# Patient Record
Sex: Male | Born: 1935 | Race: Black or African American | Hispanic: No | Smoking: Former smoker
Health system: Southern US, Community
[De-identification: ages and names within clinical notes are randomized; demographics above are authoritative.]

## PROBLEM LIST (undated history)

## (undated) DIAGNOSIS — I10 Essential (primary) hypertension: Secondary | ICD-10-CM

## (undated) DIAGNOSIS — R17 Unspecified jaundice: Secondary | ICD-10-CM

## (undated) DIAGNOSIS — E785 Hyperlipidemia, unspecified: Secondary | ICD-10-CM

## (undated) DIAGNOSIS — K219 Gastro-esophageal reflux disease without esophagitis: Secondary | ICD-10-CM

## (undated) DIAGNOSIS — Z950 Presence of cardiac pacemaker: Secondary | ICD-10-CM

## (undated) DIAGNOSIS — D649 Anemia, unspecified: Secondary | ICD-10-CM

## (undated) DIAGNOSIS — R7989 Other specified abnormal findings of blood chemistry: Secondary | ICD-10-CM

## (undated) DIAGNOSIS — N429 Disorder of prostate, unspecified: Secondary | ICD-10-CM

## (undated) DIAGNOSIS — M109 Gout, unspecified: Secondary | ICD-10-CM

## (undated) DIAGNOSIS — C189 Malignant neoplasm of colon, unspecified: Secondary | ICD-10-CM

## (undated) DIAGNOSIS — R1011 Right upper quadrant pain: Secondary | ICD-10-CM

## (undated) DIAGNOSIS — Z87891 Personal history of nicotine dependence: Secondary | ICD-10-CM

## (undated) DIAGNOSIS — C61 Malignant neoplasm of prostate: Secondary | ICD-10-CM

## (undated) HISTORY — PX: PROSTATE SURGERY: SHX751

## (undated) HISTORY — PX: PACEMAKER INSERTION: SHX728

## (undated) HISTORY — PX: COLON SURGERY: SHX602

---

## 2001-07-15 ENCOUNTER — Other Ambulatory Visit: Admission: RE | Admit: 2001-07-15 | Discharge: 2001-07-15 | Payer: Self-pay | Admitting: Urology

## 2001-07-25 ENCOUNTER — Encounter: Payer: Self-pay | Admitting: General Surgery

## 2001-07-25 ENCOUNTER — Ambulatory Visit (HOSPITAL_COMMUNITY): Admission: RE | Admit: 2001-07-25 | Discharge: 2001-07-25 | Payer: Self-pay | Admitting: General Surgery

## 2001-07-29 ENCOUNTER — Encounter: Payer: Self-pay | Admitting: Urology

## 2001-07-29 ENCOUNTER — Ambulatory Visit (HOSPITAL_COMMUNITY): Admission: RE | Admit: 2001-07-29 | Discharge: 2001-07-29 | Payer: Self-pay | Admitting: Urology

## 2001-08-23 ENCOUNTER — Ambulatory Visit: Admission: RE | Admit: 2001-08-23 | Discharge: 2001-12-08 | Payer: Self-pay | Admitting: Radiation Oncology

## 2001-10-26 ENCOUNTER — Encounter: Admission: RE | Admit: 2001-10-26 | Discharge: 2001-10-26 | Payer: Self-pay | Admitting: Urology

## 2001-10-26 ENCOUNTER — Encounter: Payer: Self-pay | Admitting: Urology

## 2001-11-02 ENCOUNTER — Ambulatory Visit (HOSPITAL_BASED_OUTPATIENT_CLINIC_OR_DEPARTMENT_OTHER): Admission: RE | Admit: 2001-11-02 | Discharge: 2001-11-02 | Payer: Self-pay | Admitting: Urology

## 2001-11-02 ENCOUNTER — Encounter: Payer: Self-pay | Admitting: Urology

## 2001-11-24 ENCOUNTER — Ambulatory Visit: Admission: RE | Admit: 2001-11-24 | Discharge: 2001-12-08 | Payer: Self-pay | Admitting: Radiation Oncology

## 2001-11-28 ENCOUNTER — Ambulatory Visit: Admission: RE | Admit: 2001-11-28 | Discharge: 2002-02-26 | Payer: Self-pay | Admitting: Radiation Oncology

## 2001-11-28 ENCOUNTER — Ambulatory Visit: Admission: RE | Admit: 2001-11-28 | Discharge: 2001-11-28 | Payer: Self-pay | Admitting: Radiation Oncology

## 2002-03-24 ENCOUNTER — Encounter: Payer: Self-pay | Admitting: Emergency Medicine

## 2002-03-24 ENCOUNTER — Inpatient Hospital Stay (HOSPITAL_COMMUNITY): Admission: EM | Admit: 2002-03-24 | Discharge: 2002-03-28 | Payer: Self-pay | Admitting: Emergency Medicine

## 2002-03-25 ENCOUNTER — Encounter: Payer: Self-pay | Admitting: General Surgery

## 2002-03-28 ENCOUNTER — Encounter: Payer: Self-pay | Admitting: *Deleted

## 2002-11-07 ENCOUNTER — Encounter: Payer: Self-pay | Admitting: Emergency Medicine

## 2002-11-07 ENCOUNTER — Inpatient Hospital Stay (HOSPITAL_COMMUNITY): Admission: EM | Admit: 2002-11-07 | Discharge: 2002-11-09 | Payer: Self-pay | Admitting: Emergency Medicine

## 2002-11-09 ENCOUNTER — Encounter: Payer: Self-pay | Admitting: *Deleted

## 2002-12-05 ENCOUNTER — Ambulatory Visit (HOSPITAL_COMMUNITY): Admission: RE | Admit: 2002-12-05 | Discharge: 2002-12-05 | Payer: Self-pay | Admitting: *Deleted

## 2003-02-28 ENCOUNTER — Ambulatory Visit (HOSPITAL_COMMUNITY): Admission: RE | Admit: 2003-02-28 | Discharge: 2003-02-28 | Payer: Self-pay | Admitting: General Surgery

## 2003-07-02 ENCOUNTER — Ambulatory Visit (HOSPITAL_COMMUNITY): Admission: RE | Admit: 2003-07-02 | Discharge: 2003-07-02 | Payer: Self-pay | Admitting: Cardiovascular Disease

## 2003-07-03 ENCOUNTER — Inpatient Hospital Stay (HOSPITAL_COMMUNITY): Admission: AD | Admit: 2003-07-03 | Discharge: 2003-07-15 | Payer: Self-pay | Admitting: Family Medicine

## 2003-08-06 ENCOUNTER — Encounter: Admission: RE | Admit: 2003-08-06 | Discharge: 2003-08-06 | Payer: Self-pay | Admitting: Oncology

## 2003-08-06 ENCOUNTER — Encounter (HOSPITAL_COMMUNITY): Admission: RE | Admit: 2003-08-06 | Discharge: 2003-09-05 | Payer: Self-pay | Admitting: Oncology

## 2003-08-06 ENCOUNTER — Encounter (INDEPENDENT_AMBULATORY_CARE_PROVIDER_SITE_OTHER): Payer: Self-pay | Admitting: Family Medicine

## 2003-08-19 ENCOUNTER — Inpatient Hospital Stay (HOSPITAL_COMMUNITY): Admission: EM | Admit: 2003-08-19 | Discharge: 2003-08-20 | Payer: Self-pay | Admitting: Emergency Medicine

## 2004-04-14 ENCOUNTER — Ambulatory Visit (HOSPITAL_COMMUNITY): Admission: RE | Admit: 2004-04-14 | Discharge: 2004-04-14 | Payer: Self-pay | Admitting: General Surgery

## 2005-04-15 ENCOUNTER — Encounter (INDEPENDENT_AMBULATORY_CARE_PROVIDER_SITE_OTHER): Payer: Self-pay | Admitting: Family Medicine

## 2005-09-17 ENCOUNTER — Encounter (INDEPENDENT_AMBULATORY_CARE_PROVIDER_SITE_OTHER): Payer: Self-pay | Admitting: Family Medicine

## 2005-09-18 ENCOUNTER — Ambulatory Visit: Payer: Self-pay | Admitting: Family Medicine

## 2005-09-21 ENCOUNTER — Encounter (INDEPENDENT_AMBULATORY_CARE_PROVIDER_SITE_OTHER): Payer: Self-pay | Admitting: Family Medicine

## 2005-10-02 ENCOUNTER — Ambulatory Visit (HOSPITAL_COMMUNITY): Admission: RE | Admit: 2005-10-02 | Discharge: 2005-10-02 | Payer: Self-pay | Admitting: Family Medicine

## 2005-10-02 ENCOUNTER — Ambulatory Visit: Payer: Self-pay | Admitting: Family Medicine

## 2005-10-07 ENCOUNTER — Encounter (INDEPENDENT_AMBULATORY_CARE_PROVIDER_SITE_OTHER): Payer: Self-pay | Admitting: Family Medicine

## 2005-10-07 ENCOUNTER — Ambulatory Visit (HOSPITAL_COMMUNITY): Admission: RE | Admit: 2005-10-07 | Discharge: 2005-10-07 | Payer: Self-pay | Admitting: Family Medicine

## 2005-10-23 ENCOUNTER — Ambulatory Visit: Payer: Self-pay | Admitting: Family Medicine

## 2005-12-04 ENCOUNTER — Ambulatory Visit: Payer: Self-pay | Admitting: Family Medicine

## 2005-12-18 ENCOUNTER — Encounter: Payer: Self-pay | Admitting: Family Medicine

## 2005-12-18 DIAGNOSIS — Z8546 Personal history of malignant neoplasm of prostate: Secondary | ICD-10-CM | POA: Insufficient documentation

## 2005-12-18 DIAGNOSIS — M545 Low back pain: Secondary | ICD-10-CM | POA: Insufficient documentation

## 2005-12-18 DIAGNOSIS — N401 Enlarged prostate with lower urinary tract symptoms: Secondary | ICD-10-CM | POA: Insufficient documentation

## 2005-12-18 DIAGNOSIS — M129 Arthropathy, unspecified: Secondary | ICD-10-CM | POA: Insufficient documentation

## 2005-12-18 DIAGNOSIS — M899 Disorder of bone, unspecified: Secondary | ICD-10-CM | POA: Insufficient documentation

## 2005-12-18 DIAGNOSIS — N138 Other obstructive and reflux uropathy: Secondary | ICD-10-CM | POA: Insufficient documentation

## 2005-12-18 DIAGNOSIS — N200 Calculus of kidney: Secondary | ICD-10-CM | POA: Insufficient documentation

## 2005-12-18 DIAGNOSIS — G43909 Migraine, unspecified, not intractable, without status migrainosus: Secondary | ICD-10-CM | POA: Insufficient documentation

## 2005-12-18 DIAGNOSIS — M949 Disorder of cartilage, unspecified: Secondary | ICD-10-CM

## 2005-12-18 DIAGNOSIS — K219 Gastro-esophageal reflux disease without esophagitis: Secondary | ICD-10-CM | POA: Insufficient documentation

## 2005-12-18 DIAGNOSIS — I10 Essential (primary) hypertension: Secondary | ICD-10-CM | POA: Insufficient documentation

## 2005-12-18 DIAGNOSIS — Z85038 Personal history of other malignant neoplasm of large intestine: Secondary | ICD-10-CM | POA: Insufficient documentation

## 2005-12-18 HISTORY — DX: Calculus of kidney: N20.0

## 2005-12-18 HISTORY — DX: Other obstructive and reflux uropathy: N13.8

## 2006-01-01 ENCOUNTER — Ambulatory Visit: Payer: Self-pay | Admitting: Family Medicine

## 2006-03-26 ENCOUNTER — Ambulatory Visit: Payer: Self-pay | Admitting: Family Medicine

## 2006-03-30 ENCOUNTER — Encounter (INDEPENDENT_AMBULATORY_CARE_PROVIDER_SITE_OTHER): Payer: Self-pay | Admitting: Family Medicine

## 2006-04-05 ENCOUNTER — Emergency Department (HOSPITAL_COMMUNITY): Admission: EM | Admit: 2006-04-05 | Discharge: 2006-04-05 | Payer: Self-pay | Admitting: Emergency Medicine

## 2006-04-05 ENCOUNTER — Encounter (INDEPENDENT_AMBULATORY_CARE_PROVIDER_SITE_OTHER): Payer: Self-pay | Admitting: Family Medicine

## 2006-04-12 ENCOUNTER — Ambulatory Visit: Payer: Self-pay | Admitting: Family Medicine

## 2006-04-15 ENCOUNTER — Encounter (INDEPENDENT_AMBULATORY_CARE_PROVIDER_SITE_OTHER): Payer: Self-pay | Admitting: Family Medicine

## 2006-04-15 ENCOUNTER — Telehealth (INDEPENDENT_AMBULATORY_CARE_PROVIDER_SITE_OTHER): Payer: Self-pay | Admitting: Family Medicine

## 2006-04-19 ENCOUNTER — Encounter (INDEPENDENT_AMBULATORY_CARE_PROVIDER_SITE_OTHER): Payer: Self-pay | Admitting: Internal Medicine

## 2006-04-19 DIAGNOSIS — S83289A Other tear of lateral meniscus, current injury, unspecified knee, initial encounter: Secondary | ICD-10-CM

## 2006-04-19 HISTORY — DX: Other tear of lateral meniscus, current injury, unspecified knee, initial encounter: S83.289A

## 2006-04-23 ENCOUNTER — Ambulatory Visit: Payer: Self-pay | Admitting: Family Medicine

## 2006-04-26 ENCOUNTER — Encounter (INDEPENDENT_AMBULATORY_CARE_PROVIDER_SITE_OTHER): Payer: Self-pay | Admitting: Family Medicine

## 2006-04-28 ENCOUNTER — Ambulatory Visit: Payer: Self-pay | Admitting: Orthopedic Surgery

## 2006-04-29 ENCOUNTER — Encounter (HOSPITAL_COMMUNITY): Admission: RE | Admit: 2006-04-29 | Discharge: 2006-05-29 | Payer: Self-pay | Admitting: Orthopedic Surgery

## 2006-05-25 ENCOUNTER — Ambulatory Visit: Payer: Self-pay | Admitting: Family Medicine

## 2006-05-26 ENCOUNTER — Encounter (INDEPENDENT_AMBULATORY_CARE_PROVIDER_SITE_OTHER): Payer: Self-pay | Admitting: Family Medicine

## 2006-05-26 ENCOUNTER — Ambulatory Visit: Payer: Self-pay | Admitting: Orthopedic Surgery

## 2006-05-26 LAB — CONVERTED CEMR LAB
ALT: 20 units/L (ref 0–53)
AST: 21 units/L (ref 0–37)
Basophils Absolute: 0 10*3/uL (ref 0.0–0.1)
Basophils Relative: 0 % (ref 0–1)
CEA: 0.8 ng/mL (ref 0.0–5.0)
Creatinine, Ser: 1.14 mg/dL (ref 0.40–1.50)
Eosinophils Relative: 2 % (ref 0–5)
HCT: 39.8 % (ref 39.0–52.0)
Hemoglobin: 14 g/dL (ref 13.0–17.0)
MCHC: 35.2 g/dL (ref 30.0–36.0)
MCV: 92.1 fL (ref 78.0–100.0)
Monocytes Absolute: 0.5 10*3/uL (ref 0.2–0.7)
RDW: 13.1 % (ref 11.5–14.0)
Total Bilirubin: 0.3 mg/dL (ref 0.3–1.2)

## 2006-06-23 ENCOUNTER — Ambulatory Visit: Payer: Self-pay | Admitting: Orthopedic Surgery

## 2006-06-29 ENCOUNTER — Encounter (INDEPENDENT_AMBULATORY_CARE_PROVIDER_SITE_OTHER): Payer: Self-pay | Admitting: Family Medicine

## 2006-07-02 ENCOUNTER — Encounter (INDEPENDENT_AMBULATORY_CARE_PROVIDER_SITE_OTHER): Payer: Self-pay | Admitting: Family Medicine

## 2006-07-07 ENCOUNTER — Ambulatory Visit: Payer: Self-pay | Admitting: Orthopedic Surgery

## 2006-07-13 ENCOUNTER — Encounter (HOSPITAL_COMMUNITY): Admission: RE | Admit: 2006-07-13 | Discharge: 2006-08-12 | Payer: Self-pay | Admitting: Orthopedic Surgery

## 2006-07-22 ENCOUNTER — Encounter (INDEPENDENT_AMBULATORY_CARE_PROVIDER_SITE_OTHER): Payer: Self-pay | Admitting: Family Medicine

## 2006-08-10 ENCOUNTER — Emergency Department (HOSPITAL_COMMUNITY): Admission: EM | Admit: 2006-08-10 | Discharge: 2006-08-10 | Payer: Self-pay | Admitting: Emergency Medicine

## 2006-08-19 ENCOUNTER — Ambulatory Visit: Payer: Self-pay | Admitting: Orthopedic Surgery

## 2006-08-24 ENCOUNTER — Ambulatory Visit: Payer: Self-pay | Admitting: Family Medicine

## 2006-08-24 ENCOUNTER — Telehealth (INDEPENDENT_AMBULATORY_CARE_PROVIDER_SITE_OTHER): Payer: Self-pay | Admitting: *Deleted

## 2006-08-26 ENCOUNTER — Encounter (INDEPENDENT_AMBULATORY_CARE_PROVIDER_SITE_OTHER): Payer: Self-pay | Admitting: Family Medicine

## 2006-08-31 ENCOUNTER — Telehealth (INDEPENDENT_AMBULATORY_CARE_PROVIDER_SITE_OTHER): Payer: Self-pay | Admitting: Family Medicine

## 2006-09-22 ENCOUNTER — Ambulatory Visit: Payer: Self-pay | Admitting: Family Medicine

## 2006-10-08 ENCOUNTER — Ambulatory Visit: Payer: Self-pay | Admitting: Family Medicine

## 2006-10-08 DIAGNOSIS — K029 Dental caries, unspecified: Secondary | ICD-10-CM | POA: Insufficient documentation

## 2006-11-23 ENCOUNTER — Ambulatory Visit: Payer: Self-pay | Admitting: Family Medicine

## 2007-01-08 ENCOUNTER — Emergency Department (HOSPITAL_COMMUNITY): Admission: EM | Admit: 2007-01-08 | Discharge: 2007-01-08 | Payer: Self-pay | Admitting: Emergency Medicine

## 2007-01-11 ENCOUNTER — Telehealth (INDEPENDENT_AMBULATORY_CARE_PROVIDER_SITE_OTHER): Payer: Self-pay | Admitting: *Deleted

## 2007-01-12 ENCOUNTER — Encounter: Payer: Self-pay | Admitting: Orthopedic Surgery

## 2007-02-22 ENCOUNTER — Ambulatory Visit: Payer: Self-pay | Admitting: Family Medicine

## 2007-02-22 ENCOUNTER — Telehealth (INDEPENDENT_AMBULATORY_CARE_PROVIDER_SITE_OTHER): Payer: Self-pay | Admitting: *Deleted

## 2007-02-22 LAB — CONVERTED CEMR LAB
Cholesterol, target level: 200 mg/dL
HDL goal, serum: 40 mg/dL
LDL Goal: 100 mg/dL

## 2007-02-24 ENCOUNTER — Encounter (INDEPENDENT_AMBULATORY_CARE_PROVIDER_SITE_OTHER): Payer: Self-pay | Admitting: Family Medicine

## 2007-02-25 ENCOUNTER — Telehealth (INDEPENDENT_AMBULATORY_CARE_PROVIDER_SITE_OTHER): Payer: Self-pay | Admitting: *Deleted

## 2007-02-25 LAB — CONVERTED CEMR LAB
ALT: 18 units/L (ref 0–53)
AST: 24 units/L (ref 0–37)
Albumin: 4.2 g/dL (ref 3.5–5.2)
Basophils Absolute: 0 10*3/uL (ref 0.0–0.1)
CO2: 26 meq/L (ref 19–32)
Calcium: 9.4 mg/dL (ref 8.4–10.5)
Chloride: 100 meq/L (ref 96–112)
Cholesterol: 169 mg/dL (ref 0–200)
Creatinine, Ser: 1.02 mg/dL (ref 0.40–1.50)
Hemoglobin: 13.7 g/dL (ref 13.0–17.0)
Lymphocytes Relative: 46 % (ref 12–46)
Monocytes Absolute: 0.4 10*3/uL (ref 0.1–1.0)
Neutro Abs: 2.1 10*3/uL (ref 1.7–7.7)
Neutrophils Relative %: 43 % (ref 43–77)
Potassium: 3.5 meq/L (ref 3.5–5.3)
RBC: 4.33 M/uL (ref 4.22–5.81)
RDW: 13 % (ref 11.5–15.5)
TSH: 2.019 microintl units/mL (ref 0.350–5.50)
Total Protein: 7.6 g/dL (ref 6.0–8.3)

## 2007-03-03 ENCOUNTER — Encounter (INDEPENDENT_AMBULATORY_CARE_PROVIDER_SITE_OTHER): Payer: Self-pay | Admitting: Family Medicine

## 2007-04-05 ENCOUNTER — Ambulatory Visit: Payer: Self-pay | Admitting: Family Medicine

## 2007-04-05 DIAGNOSIS — E785 Hyperlipidemia, unspecified: Secondary | ICD-10-CM | POA: Insufficient documentation

## 2007-05-13 ENCOUNTER — Ambulatory Visit: Payer: Self-pay | Admitting: Family Medicine

## 2007-05-14 ENCOUNTER — Encounter (INDEPENDENT_AMBULATORY_CARE_PROVIDER_SITE_OTHER): Payer: Self-pay | Admitting: Family Medicine

## 2007-05-15 LAB — CONVERTED CEMR LAB
AST: 30 units/L (ref 0–37)
Alkaline Phosphatase: 59 units/L (ref 39–117)
Bilirubin, Direct: 0.1 mg/dL (ref 0.0–0.3)
Indirect Bilirubin: 0.8 mg/dL (ref 0.0–0.9)
Total Bilirubin: 0.9 mg/dL (ref 0.3–1.2)

## 2007-05-17 ENCOUNTER — Telehealth (INDEPENDENT_AMBULATORY_CARE_PROVIDER_SITE_OTHER): Payer: Self-pay | Admitting: *Deleted

## 2007-06-24 ENCOUNTER — Ambulatory Visit: Payer: Self-pay | Admitting: Family Medicine

## 2007-06-24 DIAGNOSIS — G4700 Insomnia, unspecified: Secondary | ICD-10-CM | POA: Insufficient documentation

## 2007-06-24 LAB — CONVERTED CEMR LAB
Bilirubin Urine: NEGATIVE
Blood in Urine, dipstick: NEGATIVE
Glucose, Urine, Semiquant: NEGATIVE
Ketones, urine, test strip: NEGATIVE
Nitrite: NEGATIVE
Specific Gravity, Urine: 1.015

## 2007-06-25 LAB — CONVERTED CEMR LAB
ALT: 17 units/L (ref 0–53)
AST: 27 units/L (ref 0–37)
Albumin: 4 g/dL (ref 3.5–5.2)
BUN: 17 mg/dL (ref 6–23)
CO2: 19 meq/L (ref 19–32)
Calcium: 9.2 mg/dL (ref 8.4–10.5)
Chloride: 104 meq/L (ref 96–112)
Cholesterol: 119 mg/dL (ref 0–200)
Creatinine, Ser: 1.08 mg/dL (ref 0.40–1.50)
HDL: 26 mg/dL — ABNORMAL LOW (ref >39)
PSA: 0.07 ng/mL — ABNORMAL LOW (ref 0.10–4.00)
Potassium: 3.5 meq/L (ref 3.5–5.3)
Total CHOL/HDL Ratio: 4.6

## 2007-06-27 ENCOUNTER — Telehealth (INDEPENDENT_AMBULATORY_CARE_PROVIDER_SITE_OTHER): Payer: Self-pay | Admitting: *Deleted

## 2007-07-01 ENCOUNTER — Encounter (INDEPENDENT_AMBULATORY_CARE_PROVIDER_SITE_OTHER): Payer: Self-pay | Admitting: Family Medicine

## 2007-09-21 ENCOUNTER — Ambulatory Visit: Payer: Self-pay | Admitting: Family Medicine

## 2007-09-21 DIAGNOSIS — M1990 Unspecified osteoarthritis, unspecified site: Secondary | ICD-10-CM | POA: Insufficient documentation

## 2007-10-03 ENCOUNTER — Encounter (INDEPENDENT_AMBULATORY_CARE_PROVIDER_SITE_OTHER): Payer: Self-pay | Admitting: Family Medicine

## 2007-10-04 ENCOUNTER — Encounter (INDEPENDENT_AMBULATORY_CARE_PROVIDER_SITE_OTHER): Payer: Self-pay | Admitting: *Deleted

## 2007-10-13 ENCOUNTER — Encounter (INDEPENDENT_AMBULATORY_CARE_PROVIDER_SITE_OTHER): Payer: Self-pay | Admitting: Family Medicine

## 2007-10-31 ENCOUNTER — Telehealth (INDEPENDENT_AMBULATORY_CARE_PROVIDER_SITE_OTHER): Payer: Self-pay | Admitting: *Deleted

## 2007-11-01 ENCOUNTER — Telehealth (INDEPENDENT_AMBULATORY_CARE_PROVIDER_SITE_OTHER): Payer: Self-pay | Admitting: *Deleted

## 2007-11-01 ENCOUNTER — Encounter (INDEPENDENT_AMBULATORY_CARE_PROVIDER_SITE_OTHER): Payer: Self-pay | Admitting: Family Medicine

## 2007-11-08 ENCOUNTER — Encounter (INDEPENDENT_AMBULATORY_CARE_PROVIDER_SITE_OTHER): Payer: Self-pay | Admitting: Family Medicine

## 2007-11-28 ENCOUNTER — Telehealth (INDEPENDENT_AMBULATORY_CARE_PROVIDER_SITE_OTHER): Payer: Self-pay | Admitting: *Deleted

## 2007-12-14 ENCOUNTER — Encounter (INDEPENDENT_AMBULATORY_CARE_PROVIDER_SITE_OTHER): Payer: Self-pay | Admitting: Family Medicine

## 2007-12-22 ENCOUNTER — Ambulatory Visit: Payer: Self-pay | Admitting: Family Medicine

## 2007-12-22 LAB — CONVERTED CEMR LAB

## 2007-12-23 ENCOUNTER — Encounter (INDEPENDENT_AMBULATORY_CARE_PROVIDER_SITE_OTHER): Payer: Self-pay | Admitting: Family Medicine

## 2007-12-26 LAB — CONVERTED CEMR LAB
BUN: 15 mg/dL (ref 6–23)
CEA: 1.2 ng/mL (ref 0.0–5.0)
CO2: 28 meq/L (ref 19–32)
Calcium: 9.3 mg/dL (ref 8.4–10.5)
Chloride: 101 meq/L (ref 96–112)
Cholesterol: 118 mg/dL (ref 0–200)
Creatinine, Ser: 1.23 mg/dL (ref 0.40–1.50)
Eosinophils Relative: 3 % (ref 0–5)
Glucose, Bld: 104 mg/dL — ABNORMAL HIGH (ref 70–99)
HCT: 43.8 % (ref 39.0–52.0)
HDL: 28 mg/dL — ABNORMAL LOW (ref >39)
Hemoglobin: 14.4 g/dL (ref 13.0–17.0)
Lymphocytes Relative: 42 % (ref 12–46)
Monocytes Absolute: 0.6 10*3/uL (ref 0.1–1.0)
Monocytes Relative: 10 % (ref 3–12)
Neutro Abs: 2.4 10*3/uL (ref 1.7–7.7)
RBC: 4.47 M/uL (ref 4.22–5.81)
Total Bilirubin: 1 mg/dL (ref 0.3–1.2)
Total CHOL/HDL Ratio: 4.2
Triglycerides: 98 mg/dL (ref <150)
VLDL: 20 mg/dL (ref 0–40)

## 2008-01-19 ENCOUNTER — Ambulatory Visit: Payer: Self-pay | Admitting: Family Medicine

## 2008-02-13 ENCOUNTER — Telehealth (INDEPENDENT_AMBULATORY_CARE_PROVIDER_SITE_OTHER): Payer: Self-pay | Admitting: *Deleted

## 2008-03-26 ENCOUNTER — Encounter (INDEPENDENT_AMBULATORY_CARE_PROVIDER_SITE_OTHER): Payer: Self-pay | Admitting: Family Medicine

## 2008-04-19 ENCOUNTER — Ambulatory Visit: Payer: Self-pay | Admitting: Family Medicine

## 2008-04-20 ENCOUNTER — Encounter (INDEPENDENT_AMBULATORY_CARE_PROVIDER_SITE_OTHER): Payer: Self-pay | Admitting: Family Medicine

## 2008-05-17 ENCOUNTER — Ambulatory Visit: Payer: Self-pay | Admitting: Family Medicine

## 2008-05-18 LAB — CONVERTED CEMR LAB
Albumin: 4.1 g/dL (ref 3.5–5.2)
Alkaline Phosphatase: 60 units/L (ref 39–117)
BUN: 14 mg/dL (ref 6–23)
Basophils Absolute: 0 10*3/uL (ref 0.0–0.1)
Calcium: 8.9 mg/dL (ref 8.4–10.5)
Chloride: 102 meq/L (ref 96–112)
Creatinine, Ser: 1.08 mg/dL (ref 0.40–1.50)
Eosinophils Absolute: 0.1 10*3/uL (ref 0.0–0.7)
Eosinophils Relative: 3 % (ref 0–5)
Glucose, Bld: 96 mg/dL (ref 70–99)
HCT: 41 % (ref 39.0–52.0)
Hemoglobin: 14.1 g/dL (ref 13.0–17.0)
Lymphocytes Relative: 41 % (ref 12–46)
MCV: 91.7 fL (ref 78.0–100.0)
Monocytes Absolute: 0.6 10*3/uL (ref 0.1–1.0)
Potassium: 3.3 meq/L — ABNORMAL LOW (ref 3.5–5.3)
RDW: 13.2 % (ref 11.5–15.5)

## 2008-06-12 ENCOUNTER — Encounter (INDEPENDENT_AMBULATORY_CARE_PROVIDER_SITE_OTHER): Payer: Self-pay | Admitting: Family Medicine

## 2008-06-30 ENCOUNTER — Encounter (INDEPENDENT_AMBULATORY_CARE_PROVIDER_SITE_OTHER): Payer: Self-pay | Admitting: Family Medicine

## 2008-07-12 ENCOUNTER — Telehealth (INDEPENDENT_AMBULATORY_CARE_PROVIDER_SITE_OTHER): Payer: Self-pay | Admitting: Family Medicine

## 2008-07-13 ENCOUNTER — Encounter (INDEPENDENT_AMBULATORY_CARE_PROVIDER_SITE_OTHER): Payer: Self-pay | Admitting: Family Medicine

## 2008-07-19 ENCOUNTER — Telehealth (INDEPENDENT_AMBULATORY_CARE_PROVIDER_SITE_OTHER): Payer: Self-pay | Admitting: *Deleted

## 2008-07-25 ENCOUNTER — Ambulatory Visit: Payer: Self-pay | Admitting: Family Medicine

## 2008-08-16 ENCOUNTER — Ambulatory Visit: Payer: Self-pay | Admitting: Family Medicine

## 2008-08-16 DIAGNOSIS — D500 Iron deficiency anemia secondary to blood loss (chronic): Secondary | ICD-10-CM | POA: Insufficient documentation

## 2008-08-16 DIAGNOSIS — D509 Iron deficiency anemia, unspecified: Secondary | ICD-10-CM | POA: Insufficient documentation

## 2008-09-10 ENCOUNTER — Ambulatory Visit: Payer: Self-pay | Admitting: Family Medicine

## 2008-09-10 DIAGNOSIS — S335XXA Sprain of ligaments of lumbar spine, initial encounter: Secondary | ICD-10-CM | POA: Insufficient documentation

## 2008-09-15 ENCOUNTER — Encounter (INDEPENDENT_AMBULATORY_CARE_PROVIDER_SITE_OTHER): Payer: Self-pay | Admitting: Family Medicine

## 2008-09-17 LAB — CONVERTED CEMR LAB
Cholesterol: 137 mg/dL (ref 0–200)
Potassium: 4.3 meq/L (ref 3.5–5.3)
Sodium: 138 meq/L (ref 135–145)
Total CHOL/HDL Ratio: 6.2
Triglycerides: 493 mg/dL — ABNORMAL HIGH (ref <150)

## 2008-09-21 ENCOUNTER — Ambulatory Visit (HOSPITAL_COMMUNITY): Admission: RE | Admit: 2008-09-21 | Discharge: 2008-09-21 | Payer: Self-pay | Admitting: Family Medicine

## 2008-09-21 ENCOUNTER — Ambulatory Visit: Payer: Self-pay | Admitting: Family Medicine

## 2009-04-17 ENCOUNTER — Ambulatory Visit (HOSPITAL_COMMUNITY): Admission: RE | Admit: 2009-04-17 | Discharge: 2009-04-17 | Payer: Self-pay | Admitting: Family Medicine

## 2009-10-07 ENCOUNTER — Ambulatory Visit (HOSPITAL_COMMUNITY): Admission: RE | Admit: 2009-10-07 | Discharge: 2009-10-07 | Payer: Self-pay | Admitting: Family Medicine

## 2010-01-23 ENCOUNTER — Ambulatory Visit (HOSPITAL_COMMUNITY)
Admission: RE | Admit: 2010-01-23 | Discharge: 2010-01-23 | Payer: Self-pay | Source: Home / Self Care | Attending: Ophthalmology | Admitting: Ophthalmology

## 2010-02-05 ENCOUNTER — Ambulatory Visit (HOSPITAL_COMMUNITY)
Admission: RE | Admit: 2010-02-05 | Discharge: 2010-02-05 | Payer: Self-pay | Source: Home / Self Care | Attending: Ophthalmology | Admitting: Ophthalmology

## 2010-03-09 ENCOUNTER — Encounter: Payer: Self-pay | Admitting: Family Medicine

## 2010-04-29 LAB — BASIC METABOLIC PANEL
BUN: 15 mg/dL (ref 6–23)
Creatinine, Ser: 1.1 mg/dL (ref 0.4–1.5)
GFR calc non Af Amer: >60 mL/min (ref >60)
Glucose, Bld: 126 mg/dL — ABNORMAL HIGH (ref 70–99)
Potassium: 3.4 mEq/L — ABNORMAL LOW (ref 3.5–5.1)

## 2010-07-04 NOTE — Procedures (Signed)
   NAME:  Derrick Long, Derrick Long                       ACCOUNT NO.:  [id number]   MEDICAL RECORD NO.:  [id number]                   PATIENT TYPE:  EMS   LOCATION:  ED                                   FACILITY:  [facility]   PHYSICIAN:  Edward L. Juanetta Gosling, M.D.             DATE OF BIRTH:  1926-08-22   DATE OF PROCEDURE:  03/24/2002  DATE OF DISCHARGE:                                EKG INTERPRETATION   PROCEDURE:  EKG.   INTERPRETATION:  The rhythm is sinus rhythm with a rate in the 80s.  There  are ST-T wave changes which are diffuse, but nonspecific.  Minimally  abnormal electrocardiogram.                                               Edward L. Juanetta Gosling, M.D.    Gwenlyn Found  D:  03/24/2002  T:  03/24/2002  Job:  [id number]

## 2010-07-04 NOTE — Discharge Summary (Signed)
NAME:  Derrick Long, ANGER NO.:  [id number]   MEDICAL RECORD NO.:  [id number]                  PATIENT TYPE:   LOCATION:                                       FACILITY:   PHYSICIAN:  Melvyn Novas, MD        DATE OF BIRTH:   DATE OF ADMISSION:  DATE OF DISCHARGE:                                 DISCHARGE SUMMARY   A 77 year old black male status post colectomy one month ago without  chemotherapy.  The patient was admitted for melena occurring over a three to  four-day period intermittently.  He denied any associated abdominal pain,  nausea, vomiting, hematemesis, or any angina, syncope, or dyspnea.  He was  hemodynamically stable in the hospital.  Admitted on clear liquid diet.  He  had an upper GI workup which was essentially unremarkable for source of  bleed, and a colonoscopy immediately after this showed normal anastomotic  site for the ileum and cecum with no gross evidence of bleeding.  He as  subsequently discharged on the following medicines.   DISCHARGE MEDICATIONS:  1. Protonix 40 mg per day.  2. Hytrin 10 mg per day.  3. Lisinopril 20 mg per day.  4. HCTZ/triamterene 25/37.5 daily.   FOLLOW UP:  He will be followed up by Dr. Katrinka Blazing and Dr. Loleta Chance in one week's  time.     ___________________________________________                                         Melvyn Novas, MD   RMD/MEDQ  D:  08/20/2003  T:  08/20/2003  Job:  [id number]   cc:   Dr. Teresa Pelton K. Loleta Chance, M.D.  [address]  [place]  [place] [address]  Fax: [phone]

## 2010-07-04 NOTE — Discharge Summary (Signed)
NAME:  Derrick Long, Derrick Long                       ACCOUNT NO.:  [id number]   MEDICAL RECORD NO.:  [id number]                   PATIENT TYPE:  INP   LOCATION:  A223                                 FACILITY:  [facility]   PHYSICIAN:  Annia Friendly. Loleta Chance, M.D.                DATE OF BIRTH:  01/04/1927   DATE OF ADMISSION:  03/24/2002  DATE OF DISCHARGE:  03/28/2002                                 DISCHARGE SUMMARY   HISTORY OF PRESENT ILLNESS:  The patient was a 77 year old married, retired,  black male from [place], [place].  Chief complaint was chest pain.  His chest pain started around 0830 hours on the day of admission.  Duration  of pain was 5 minutes.  The patient experienced association of nausea with  burning chest pain.  He had no complaints of shortness of breath, syncope,  arm pain, diaphoresis, and dizziness.  Medical history was negative for  known coronary artery disease.  Risk factor was positive for hypertension.  Medical history also is positive for irritable bowel syndrome, prostate  cancer, and hiatal hernia.   REVIEW OF SYSTEMS:  Positive diarrhea x 2-3 days.  Stools described as  watery [place] and associated with abdominal cramping for onset of stool.  Stools usually occur 15-30 minutes after eating.  The patient experienced up  to five stools per day.  Review of systems were negative for fever, chills,  melena, hematochezia, and abdominal distention.   ALLERGIES:  The patient was not allergic to any known medication.   HABITS:  Negative for current use of ethanol and negative for use of tobacco  products.   FAMILY HISTORY:  Mother deceased age 96 secondary to complication of heart  disease; father deceased age 30 secondary to heart disease; four brothers  deceased secondary to complication of heart disease; one son living at age  38 with history of hypertension; two daughters living age 42 with history of  hypertension and age 75 health unknown.   HOSPITAL  COURSE:  1. ATYPICAL CHEST PAIN.  Vitals on admission on the floor were as follows:     Temperature 97.4, pulse 55, respirations 20, blood pressure 109/61.     General appearance revealed an elderly medium-frame, medium-height black     male who was in no respiratory distress.  Skin was warm and dry.  Lungs     were clear.  Heart revealed audible S1 and S2 without murmur, rate was 60     and regular.  Abdomen demonstrated no distention with hypoactive bowel     sounds.  Abdomen was soft on palpation and demonstrated midepigastric     tenderness.  Abdominal exam demonstrated no palpable mass or     organomegaly.  Extremities demonstrated no edema.  Knees with positive     crepitus (mild).  Significant labs on admission were as follows: Serum     amylase 210, serum lipase 34; white  count 5.3, hemoglobin 13, hematocrit     38, platelets 213,000; total CPK 236, CK-MB 2.9, troponin I of  0.02;     sodium 131, potassium 3.7, chloride 103, CO2 of 27, glucose 95, BUN 18,     creatinine 1.6.  Ultrasound of abdomen demonstrated slight thick     gallbladder wall, questionable gallbladder stone fixed at gallbladder     neck; questionable to intraductal stone; common bile duct dilated 11 mm;     pancreas and aorta inadequately seen.  Chest x-ray demonstrated no acute     finding.  The patient was admitted to telemetry, IV of normal saline at     50 mL/hr, analgesia for pain, order repeat cardiac enzymes, fasting lipid     profile and cardiology consult.  EKG demonstrated a sinus rhythm at a     rate of 59 and first-degree AV block.  Cardiology saw the patient during     this hospitalization on 03/27/2002.  Repeat cardiac enzymes on 03/24/2002     at 1920 hours revealed the following: Total CPK 204, CK-MB 2.7, troponin     I of 0.01; on 03/25/2002 at 0304 hours total CPK 210, CK-MB 2.4, troponin     I of 0.01.  It should be noted that the patient saw cardiology after     undergoing an elective  cholecystectomy during this hospitalization for     cholelithiasis and cholecystitis.  Cardiology's evaluation was status     post cholecystectomy doing well.  Cardiology felt that chest pain was     atypical for coronary artery disease and actually appeared to be     primarily right upper quadrant pain.  However, with his other risk     factors, especially family history, his age, and hypertension, they felt     that a Cardiolite test was not unreasonable.  He was scheduled for     Adenosine-Cardiolite study during this hospitalization.  Adenosine     Cardiolite study was negative.  The patient also underwent an     echocardiogram on 03/27/2002 which revealed the following: The left     ventricular size was normal size and mildly hyperdynamic with no     significant wall motion abnormality seen; diastolic function was not     assessed; the right ventricle was normal size with normal systolic     function; the atriums were both mildly enlarged; the aortic valve was     mildly sclerotic with evidence of bicuspid aortic valve; there were mild     increased velocities across the valve consistent with peak gradient by 12     mmHg and a mean gradient of 7 mmHg with an estimated valve area of 2.2 sq     cm consistent with mild aortic stenosis; the mitral valve was     morphologically unremarkable with trace mitral regurgitation; tricuspid     valve was morphologically unremarkable with mild tricuspid regurgitation;     the pulmonic valve was not well seen; however, there appeared to be at     least mild pulmonic insufficiency; the pericardial structures were normal     as read by Dr. Vida Roller.  Aorta measured 36 mm.  The left atrium     was 45 mm which was enlarged.  The septum was 14 mm which was enlarged.     The posterior wall was 11 mm which was normal.  The ventricular diastolic     dimension was 45 mm.  The  left ventricular systolic dimension was 28 mm.    The Cardiolite stress test  conclusion was a very low risk scan with     evidence of mild diaphragmatic attenuation with no significant ischemia     with normal ejection fraction and normal wall motion abnormalities as     read by Dr. Dionicio Stall on 03/28/2002.   1. CHOLELITHIASIS AND CHOLECYSTITIS.  Abdominal exam on admission     demonstrated some mild obesity with midepigastric tenderness.  Ultrasound     was as aforementioned.  The patient underwent a laparoscopic     cholecystectomy with cholangiogram without complication on 03/23/2002 by     Dr. Elpidio Anis.  A pathology report was read as gallbladder and chronic     cholecystitis by Dr. Gaylene Brooks, pathologist.  The patient was     discharged home in satisfactory condition.   1. HYPOTENSION.  Blood pressure on admission was 109/61.  Adjustment was     made in blood pressure meds during this hospitalization.  Lungs were     clear on admission.  Heart revealed audible S1 and S2 without murmur,     rub, or gallop.  Rate was 60s.  Chest x-ray demonstrated cardiomegaly     without acute abnormality by Dr. Shannan Harper.  Significant labs on admission     were as follows: Sodium 131, potassium 3.7, chloride 103, CO2 of 27.  The     patient also was treated with low sodium diet.  The patient at time of     discharge had no complaint of chest pain, shortness of breath, nausea,     vomiting, or general malaise.  He was alert and oriented to person,     place, and time at time of discharge.  Appetite was described as fair.   1. PROSTATE CANCER.  The patient is followed by urologist pertaining to his     cancer.  He has not complained of burning with the urination, gross     hematuria, or urinary hesitancy.  He will follow up with urologist as     outpatient.   1. HIATAL HERNIA.  The patient was treated with Aciphex 20 mg p.o. every     day.  He is also advised to sleep on two pillows.  The patient was also     going to avoid spicy and greasy foods.   1. VIRAL  SYNDROME.  The patient was treated with Restoril initially on     admission to help with sleep.  He did not incur any significant diarrhea     during this hospitalization.  He did incur some constipation after     surgery and required milk of magnesia.  The patient will be treated with     antianxiety agent as needed as outpatient.  Other significant labs during     this hospitalization were as follows: Triglyceride 80 mg/dL, HDL 28     mg/dL, total cholesterol of 161 mg/dL.  Thyroid function reveals the     following: T4 7.9 mcg/dL, W9U 04.5%, TSH 4.098 mIU/L, Free T4 1.08 ng/dL.     Urinalysis revealed the following on admission: Specific gravity 1.020,     pH 5.0, no glucose, no hemoglobin, no bilirubin, no ketone, no protein,     nitrite negative.   INSTRUCTIONS AT TIME OF DISCHARGE:  1. Diet: 4 g sodium.  2. Activity: No lifting x4 weeks.  No sexual activity x4 weeks.  No driving  x1 week.  Increase slowly activity. 3. Medications: Norvasc 5 mg p.o. every day, lisinopril 20 mg p.o. once     daily, Maxzide 25 mg p.o. once daily, Hytrin 10 mg p.o. every day,     Rabeprazole NA 20 mg p.o. once daily, one baby aspirin p.o. every day.   FINAL PRIMARY DIAGNOSES:  1. Atypical chest pain.  2. Cholecystitis and cholelithiasis with normal cholangiogram.   SECONDARY DIAGNOSES:  1. Hypotension.  2. Prostate cancer.  3. Hiatal hernia.  4. Irritable bowel syndrome.                                               Annia Friendly. Loleta Chance, M.D.    Levonne Hubert  D:  04/22/2002  T:  04/23/2002  Job:  [id number]

## 2010-07-04 NOTE — H&P (Signed)
NAME:  Derrick Long, SCHULKE NO.:  [id number]   MEDICAL RECORD NO.:  [id number]                   PATIENT TYPE:  EMS   LOCATION:  ED                                   FACILITY:  [facility]   PHYSICIAN:  Melvyn Novas, MD        DATE OF BIRTH:  1926/07/03   DATE OF ADMISSION:  08/19/2003  DATE OF DISCHARGE:                                HISTORY & PHYSICAL   The patient is a 77 year old black male status post colectomy one month ago  for colon cancer.  He has yet to consent to chemotherapy.  He had been doing  well at home.  He complained of a three to four-day history of intermittent  black melanotic stools.  He denies any associated abdominal pain, nausea,  vomiting, hematemesis.  He denies dizziness or syncope.  He denies angina or  dyspnea.  He was seen in hospital at the prompting of his daughter.  Stools  were heme positive in the ER, and he is admitted for monitoring of his  hemoglobin and hematocrit and possible endoscopy if clinically indicated.  He also likewise denies any chest pain.  He has not had any constipation or  diarrheas associated since the aforementioned surgery.   PAST MEDICAL HISTORY:  1. Hypertension.  2. Irritable bowel syndrome.  3. Prostate carcinoma with cesium implants.  4. Hiatal hernia.  5. GERD.   PAST SURGICAL HISTORY:  1. Cholecystectomy.  2. Colectomy one month ago.   ALLERGIES:  No known allergies.   SOCIAL HISTORY:  Nonsmoker, nondrinker.  He did smoke for 25 years, one pack  per day, but quit 20 years ago.   CURRENT MEDICATIONS:  1. Lisinopril 20 a day.  2. Terazosin 10 mg per day.  3. HCTZ/triamterene 25/37 one a day.  4. Cardura 2 mg per day.   PHYSICAL EXAMINATION:  VITAL SIGNS:  Blood pressure 95/53, pulse 80 and  regular, respiratory rate 20, temperature 97.  HEENT:  Eyes: PERRLA.  Extraocular movements intact.  Sclerae clear.  Conjunctiva pink.  NECK:  No jugular venous distention, no carotid  bruits, no thyroid bruits.  LUNGS:  Increased expiratory phase, no rubs, wheeze, or rhonchi appreciable.  No dullness.  HEART:  Regular rate and rhythm.  No murmurs.  No S3 or S4 gallops.  No  heaves, thrills, or rubs appreciable.  ABDOMEN:  Soft, nontender.  Bowel sounds are normoactive.  No peristaltic  rushes.  No guarding or rebound.  There is a midline incision present,  healing well.  No masses or organomegaly detected.  RECTAL: Stools reported as heme positive.  EXTREMITIES:  No clubbing, cyanosis, or edema.  NEUROLOGIC:  Cranial nerves II-XII grossly intact.  The patient moves all  four extremities.   IMPRESSION:  1. Melanotic stools for three days.  2. Status post colectomy for colon cancer one month ago.  3. Prostate cancer status post radiation implants.  4. Two undiagnosed renal lesions, possible different  primary lesions     undiagnosed at present.  5. Hypertension.   PLAN:  1. Admit for serial hemoglobin and hematocrit.  2. Clear liquid diet.  3. Proton pump inhibitor.  4. Will get a GI consult for possible endoscopy.  5. Continue most of his current medicines.  6. Will make further recommendations as database expands.     ___________________________________________                                         Melvyn Novas, MD   RMD/MEDQ  D:  08/19/2003  T:  08/19/2003  Job:  [id number]

## 2010-07-04 NOTE — H&P (Signed)
NAME:  Derrick Long, Derrick Long                       ACCOUNT NO.:  [id number]   MEDICAL RECORD NO.:  [id number]                   PATIENT TYPE:  INP   LOCATION:  A223                                 FACILITY:  [facility]   PHYSICIAN:  Annia Friendly. Loleta Chance, M.D.                DATE OF BIRTH:  November 16, 1926   DATE OF ADMISSION:  03/24/2002  DATE OF DISCHARGE:                                HISTORY & PHYSICAL   INTRODUCTION:  The patient is a 77 year old married, retired black male from  [place], [place].   CHIEF COMPLAINT:  Chest pain.   HISTORY OF PRESENT ILLNESS:  The chest pain started around 0830 at home.  Duration of pain was five minutes.  Here history was positive for  association of nausea with burning chest pain.  The patient had no complaint  of shortness of breath, syncope, arm pain, diaphoresis, and dizziness.  History is significant for diarrhea x 2-3 days.  Diarrhea described as  watery [place].  The patient experienced abdominal cramping before onset of  stools.  Moreover, watery stool occurs 15-30 minutes after eating.  Number  of stool per day varies up to five.  History is negative for fever, chills,  melena, hematochezia, and abdominal distention.  Medical history is positive  for hypertension, irritable bowel syndrome, prostate cancer, and hiatal  hernia.  Medical history is negative for diabetes, tuberculosis, sickle  cell, asthma, seizure disorder.   ALLERGIES:  The patient is not allergic to any known medications.   HABITS:  Habits are negative for current use of ethanol, and negative for  use of tobacco products.  Moreover, habits are negative for use of street  drugs.   PRESCRIBED MEDICATIONS ON ADMISSION:  1. Felodipine 5 mg p.o. every day.  2. Lisinopril 20 mg p.o. every day.  3. Maxzide 25 p.o. every day.  4. Hytrin 10 mg every daytime.  5. Rabeprazole NA 20 mg p.o. daily.   FAMILY HISTORY:  Mother deceased age 23 secondary to complications of heart  disease.   Father deceased age 69 secondary to heart disease.  Four brothers  deceased, cause unknown.  One son living age 53 with history of  hypertension.  Two daughters living, age 6 with history of hypertension,  age 89 health unknown.   REVIEW OF SYSTEMS:  Positive for episodic burning with voiding over six  months, occasional urinary dribbling, occasional urinary hesitancy over six  months.  Review of systems negative for gross hematuria, weight loss,  chronic cough, bone pain, unexplained fever, loss of appetite, dysphagia,  chronic headache, epistaxis, bleeding gum, edema of legs, etc.   PAST MEDICAL HISTORY:  Positive for hospitalization for esophagitis,  gastroenteritis in April 2001, at [facility].   PHYSICAL EXAMINATION:  VITAL SIGNS:  On the floor:  Temperature 97.4, pulse  55, respirations 20, blood pressure 109/61.  HEENT:  Head, normocephalic.  Ears, normal auricles.  External  canals with  some cerumen.  Eyes, lids negative for ptosis.  Sclerae white.  Pupils  round, equal, and reactive to light.  Extraocular movements intact.  Nose,  negative for discharge.  Mouth, positive for upper dentures.  Few teeth on  the bottom.  No bleeding gums.  Posterior pharynx benign.  NECK:  Negative for lymphadenopathy or thyromegaly.  LUNGS:  Clear.  HEART:  Audible S1, S2 without murmur.  Rate 60 with regular rhythm.  ABDOMEN:  No distention.  Hyperactive bowel sounds.  Soft.  Positive for  midepigastric tenderness.  No palpable masses.  No organomegaly.  GENITOURINARY:  External genitalia, penis uncircumcised.  No penile lesion  or discharge.  Scrotum, palpable testicles without nodule or tenderness.  RECTAL:  Deferred.  EXTREMITIES:  No pedal edema.  Knees, positive mild crepitus.  Palpable  femoral artery bilaterally.  Nonpalpable dorsalis pedis bilaterally.  NEUROLOGIC:  Alert and oriented to person, place, and time.  Cranial nerves  II-XII appeared intact.   LABORATORY DATA:   Ultrasound of gallbladder demonstrated slight thick  gallbladder wall; questionable gallbladder stone fixed at gallbladder neck;  questionable two intraductal stones; common bile duct dilated 11 mm;  pancreas, aorta inadequately seen.  Chest x-ray demonstrated no acute  finding.   Serum amylase 210, lipase 34.  White count 5.3, hemoglobin 13, hematocrit  38, platelets 213,000.  Total CPK 236, CK-MB 2.9, troponin I 0.02.  Sodium  131, potassium 3.7, chloride 103, CO2 27, glucose 95, BUN 18, creatinine  1.6.   EKG demonstrated sinus rhythm with a rate of 59 and first-degree AV block.   IMPRESSION:  Primary acute chest pain.   SECONDARY DIAGNOSES:  1. Cholelithiasis by ultrasound, possibly.  2. Hypertension.  3. Hiatal hernia.  4. Irritable bowel syndrome.  5. Sinus bradycardia.   PLAN:  H2 blocker, repeat cardiac enzymes q.8h. x 2.  Follow direction of  surgeon.  Antihypertensive medications.  Urinalysis.  Thyroid laboratory.  IV normal saline at [facility], oxygen via nasal cannula.  Repeat EKG.  Repeat  serum amylase and lipase in the a.m.  Stool culture, stool laboratories.                                               Annia Friendly. Loleta Chance, M.D.    Levonne Hubert  D:  03/24/2002  T:  03/25/2002  Job:  [id number]

## 2010-07-04 NOTE — Consult Note (Signed)
NAME:  Derrick Long, Derrick Long                       ACCOUNT NO.:  [id number]   MEDICAL RECORD NO.:  [id number]                   PATIENT TYPE:  INP   LOCATION:  A211                                 FACILITY:  [facility]   PHYSICIAN:  Doylene Canning. Ladona Ridgel, M.D.               DATE OF BIRTH:  09/26/1926   DATE OF CONSULTATION:  11/08/2002  DATE OF DISCHARGE:                                   CONSULTATION   INDICATIONS FOR CONSULTATION:  Evaluation of bradycardia in the setting of  atypical chest pain.   HISTORY OF PRESENT ILLNESS:  The patient is a very pleasant 77 year old man  who was admitted to the hospital  with atypical chest pain. He was seen in  consultation by Dr. Dorethea Clan  initially and set  up for a Cardiolite stress  test. His symptoms have been described as a burning sensation in the  midepigastric area. By report he had an exercise treadmill test several  years ago which was negative. The patient denies any history of syncope or  near syncope. He denies palpitations. He does have some shortness of breath  with his chest discomfort.   He was noted on telemetry monitoring today  to have episodes of  bradycardia  with pauses  of greater than 3 seconds. These are associated with sinus node  slowing. There was at least 1 nonconducted P-wave associated with this which  would certainly make [place] wonder about whether there was a possible vagal  component to his heart block. The patient, however, does  have baseline  AV  conduction system disease with a baseline  PR interval of over 300  milliseconds. Despite a recurrence of these episodes, during the day when  the patient was not asleep, he  has denied symptoms. He is now referred for  additional evaluation  and treatment.   PAST MEDICAL HISTORY:  1. Cholecystectomy.  2. Hernia surgery.  3. History of prostate cancer.  4. History of hypertension.   SOCIAL HISTORY:  The patient smoked approximately 1/2 pack of cigarettes for  25 years. He  denies alcohol  abuse.   FAMILY HISTORY:  Noncontributory.   REVIEW OF SYSTEMS:  Notable for his atypical chest pain with shortness of  breath as previously noted. He denied any vision or hearing problems. He  does wear corrective lens though. He denies  night sweats, nausea or  vomiting, diarrhea or constipation except as previously noted. He  denies  arthritic complaints. He denies any polyuria or polydipsia. He denies any  neurologic symptoms.   PHYSICAL EXAMINATION:  GENERAL:  He is a pleasant, well appearing elderly  man in no distress.  VITAL SIGNS:  Blood pressure today 118/68, pulse 60 and regular,  respirations 18.  HEENT:  Normocephalic and atraumatic. Pupils equal and round. Oropharynx  moist. Sclerae anicteric.  NECK:  No jugular venous distention. No thyromegaly. Carotids were 2+ and  symmetric.  The trachea  was midline.  LUNGS:  Clear to auscultation bilaterally. No wheezing, rales or rhonchi.  CARDIOVASCULAR:  Regular rate and rhythm with normal S1 and S2. No obvious  gallops.  ABDOMEN:  Soft, nontender, nondistended. No organomegaly.  EXTREMITIES:  No clubbing, cyanosis or edema.   LABORATORY DATA:  An EKG demonstrates normal sinus rhythm with pronounced  1st degree AV block in the setting of a narrow QRS.   IMPRESSION:  1. Asymptomatic bradycardia  with pauses  of over 3 seconds.  2. Atypical chest pain.  3. Baseline conduction system disease with baseline PR interval of 300     milliseconds.   DISCUSSION:  As Derrick Long appears to be totally asymptomatic with regard to  his pauses, I have not recommended  initially to proceed with pacemaker  insertion. However, I think if he had any symptoms, then pacemaker would be  warranted. He is presently set up for an Adenosine Cardiolite stress test  and I will plan to reorder this to an exercise Cardiolite stress test. This  will be important because Adenosine may well cause transient AV block and  make his scan less  helpful and it would also be very important to known  whether he  has worsening heart  block with exercise. I have discussed  these issues with the patient and he wishes to proceed.                                               Doylene Canning. Ladona Ridgel, M.D.    GWT/MEDQ  D:  11/08/2002  T:  11/12/2002  Job:  [id number]   cc:   Hanley Hays. Dechurch, M.D.  [address]  [place]  [place] [address]  Fax: [phone]   Kathrine Cords, R.N. [facility]   Vida Roller, M.D.  Fax: [phone]

## 2010-07-04 NOTE — Consult Note (Signed)
NAME:  Derrick Long, Derrick Long                       ACCOUNT NO.:  [id number]   MEDICAL RECORD NO.:  [id number]                   PATIENT TYPE:  INP   LOCATION:  A223                                 FACILITY:  [facility]   PHYSICIAN:  Vida Roller, M.D.                DATE OF BIRTH:  1926/04/24   DATE OF CONSULTATION:  03/27/2002  DATE OF DISCHARGE:                                   CONSULTATION   REFERRING PHYSICIANS:  1. Annia Friendly. Loleta Chance, M.D.  2. Dirk Dress. Katrinka Blazing, M.D.   REASON FOR CONSULTATION:  Chest discomfort.   HISTORY OF PRESENT ILLNESS:  The patient is a 77 year old black male who  presented to the [facility] Emergency Department complaining of right upper  quadrant discomfort and mild chest pain who has been admitted and found to  have cholecystitis with choledocholithiasis and has undergone a laparoscopic  cholecystectomy two days prior to the consultation.  He has done well  postoperatively; however, continues to improve.  This is an evaluation for  his chest discomfort.   The discomfort is nonexertional, does not occur with any kind of emotional  distress or any triggering factors.  It is very atypical for coronary artery  disease, described as sort of a thumping in the middle of his chest.  It  does not radiate.  No diaphoresis or shortness of breath.  He is an active  male without any significant limitations and tolerated general endotracheal  anesthetic very well two days prior to the evaluation.  He has not had any  of this discomfort since his operation.   ALLERGIES:  He is not allergic to any medications.   PAST MEDICAL HISTORY:  Significant for hypertension, for which he takes  several medications including lisinopril and Maxzide.  He has prostate  cancer.  He is status post placement of cesium into his prostate.  He has  previous history of hernia surgery and the cholecystectomy.   FAMILY HISTORY:  His family history is significant for his mother dying at  age 31 of  heart disease.  Father died at age 2 of heart disease.  He has  four brothers, all who are deceased in their 72s, all with heart disease.  One son living at age 44 who has a history of hypertension.  Two daughters  living, age 2 and 61, both of which have hypertension.   REVIEW OF SYSTEMS:  Noncontributory.   MEDICATIONS UPON ADMISSION:  1. Felodipine 5 mg once a day.  2. Lisinopril 20 mg once a day.  3. Maxzide 25 mg once a day.  4. Hytrin 10 mg every day.  5. Rabeprazole NA 20 mg p.o. once a day.   CURRENT MEDICATIONS IN THE HOSPITAL:  1. Lisinopril 20 mg a day.  2. Protonix 40 mg a day.  3. Hytrin 5 mg a day.  4. Felodipine 5 mg a day.  5. Hydrochlorothiazide 25 mg a  day.  6. Restoril 30 mg at bedtime.  7. Cefazolin 1 g x1 perioperatively.  8. Phenergan as needed.  9. Morphine sulfate as needed.  10.      Tylenol as needed.  11.      Ambien as needed.  12.      Restoril as needed.  13.      His is on a normal saline infusion at 75 mL an hour.   SOCIAL HISTORY:  He does not smoke.  He does not drink alcohol.  He quit  smoking approximately 30 years ago.   PHYSICAL EXAMINATION:  GENERAL:  He is a well-developed, well-nourished  black male in no apparent distress.  He was alert and oriented x4.  VITAL SIGNS:  His blood pressure was 136/64, heart rate of 88, respiratory  rate of 18, and he was afebrile.  HEENT:  Unremarkable.  NECK:  Supple with no jugular venous distention or carotid bruits.  CHEST:  Clear to auscultation.  HEART:  A nondisplaced point of maximal impulse with normal first and second  heart sounds.  There is no third or fourth heart sound appreciated.  He has  a 2/6 diamond-shaped systolic murmur heard best at the upper right sternal  border which does not radiate and does not worsen with Valsalva.  No  diastolic murmur is heard.  ABDOMEN:  Soft, nontender except for the surgical site with normoactive  bowel sounds.  GENITOURINARY/RECTAL:  Exams are  deferred.  EXTREMITIES:  No edema, clubbing, or cyanosis with 2+ pulses throughout.  NEUROLOGIC:  Nonfocal.   LABORATORY DATA:  Laboratory exam from February 7 shows a white blood cell  count of 4.4 with an H&H of 11 and 33, platelet count of 196,000.  His  sodium is 131, potassium is 3.7, chloride of 103, with a bicarb of 27.  His  BUN is 18, creatinine of 1.6.  Blood glucose of 95.  His liver function  studies are all within normal limits.  His amylase is 327, up from 192, with  a lipase of 27 which has not changed during his hospitalization.  Of note  here is that he has recently had a cholangiogram.  Cardiac enzymes done on  February 7 show two sets, both of which are inconsistent with myocardial  infarction.  CK of 207 followed by CK of 210 with an MB fraction of 1.1 and  1.3, respectively.  Troponins are 0.01 and 0.01.  His cholesterol is 155  with triglycerides of 80.  His HDL was 28, which is low, and his LDL is 110.  Thyroid function studies are normal, as well as a urinalysis.   EKG demonstrates sinus rhythm at a rate of 59 with a first-degree AV block.  There is no active ischemia or Q waves.  Chest x-ray reveals mildly ectatic  aorta with mild cardiomegaly.   ASSESSMENT:  1. Status post cholecystectomy, doing very well.  Tolerated general     endotracheal anesthetic.  2. Abdominal pain, likely secondary to #1.  3. Chest pain.  This is very atypical for coronary disease and actually     appears to be primarily right upper quadrant pain; however, with his     other cardiac risk factors, especially his family history and his age, it     is not unreasonable they do a Cardiolite to assess that.  I will schedule     an adenosine Cardiolite.  4. Sinus bradycardia.  This appears to be primarily in the  setting of his     abdominal.  It is likely vagal.  He is not on any great lowering     medications and does not appear to have any ischemia when he does have    these episodes of  bradycardia, so this is likely not something we would     pursue with treatment.  5. Heart murmur.  It is concerning for aortic stenosis.  He does not have     evidence of critical aortic stenosis on physical exam; however, at his     age and the duration of his murmur, it is reasonable to get an     echocardiogram to assess this.  6. Hypertension.  It is well controlled on medications.  7. Anemia.  He is postoperative and this is likely the cause.  It will     resolve on its own hopefully.  8. Hyponatremia.  He is on hydrochlorothiazide and this is likely the cause.     Once hydrochlorothiazide patients were used to Maxzide, which has an     element of triamterene in it, which is a potassium-sparing diuretic, if     that is taken away the hydrochlorothiazide frequently causes natruresis,     so consideration may be to stop the hydrochlorothiazide, as his blood     pressure is likely adequately controlled without this medication.                                               Vida Roller, M.D.    JH/MEDQ  D:  03/27/2002  T:  03/27/2002  Job:  [id number]

## 2010-07-04 NOTE — Op Note (Signed)
NAME:  Derrick Long, Derrick Long                       ACCOUNT NO.:  [id number]   MEDICAL RECORD NO.:  [id number]                   PATIENT TYPE:  INP   LOCATION:  A223                                 FACILITY:  [facility]   PHYSICIAN:  Dirk Dress. Katrinka Blazing, M.D.                DATE OF BIRTH:  Jul 09, 1926   DATE OF PROCEDURE:  03/23/2002  DATE OF DISCHARGE:                                 OPERATIVE REPORT   PREOPERATIVE DIAGNOSES:  1. Cholelithiasis.  2. Cholecystitis.  3. Possible common bile duct stone.   POSTOPERATIVE DIAGNOSES:  1. Cholelithiasis.  2. Cholecystitis.  3. Normal cholangiogram.   PROCEDURE:  Laparoscopic cholecystectomy with cholangiogram.   SURGEON:  Jerolyn Shin C. Katrinka Blazing, M.D.   DESCRIPTION OF PROCEDURE:  Under general anesthesia, the patient's abdomen  was prepped and draped in a sterile field.  Supraumbilical incision was made  with the Veress needle which was inserted uneventfully.  Abdomen was  insufflated with 3 liters of CO2.  Using Visiport guide, a 10 mm port was  placed uneventfully.  The laparoscope was placed.  Under videoscopic  guidance, a 10 mm port and two 5 mm ports were placed uneventfully.  The  gallbladder was positioned.  The cystic duct was dissected.  It was clipped  with one clip at its junction with the gallbladder.  A small incision was  made in the duct.  A taut catheter was placed with some difficulty.  There  was no leakage from the cystic duct.  The cholangiogram was done.  This  showed normal caliber bile duct with no filling defect with free flow into  the duodenum.  The duct at the level of the cystic duct was not well seen  because the instrument was lying across this section at the time of the  cholangiogram.  The catheter was removed, and the cystic duct was clipped  with four clips and divided.  The cystic artery was dissected with three  clips and divided.  Using electrocautery, the gallbladder was separated from  the hepatic bed without  difficulty.  The gallbladder was grasped and  retrieved.  I could not palpate any stones in the gallbladder.  The bed show  no bleeding or bile leak.  Irrigation was carried out, and it was clear.  CO2 was allowed to escape from the abdomen and the ports were removed.  The  incisions were closed using 0 Dexon on the fascia of the larger incisions  and staples on the skin.  Dressings were placed.  He was awakened from  anesthesia uneventfully, transferred to a bed and taken to the post  anesthesia care unit.                                                Dirk Dress.  Katrinka Blazing, M.D.    LCS/MEDQ  D:  03/25/2002  T:  03/25/2002  Job:  [id number]

## 2010-07-04 NOTE — Group Therapy Note (Signed)
   NAME:  FAYETTE, HAMADA                       ACCOUNT NO.:  [id number]   MEDICAL RECORD NO.:  [id number]                   PATIENT TYPE:  INP   LOCATION:  A211                                 FACILITY:  [facility]   PHYSICIAN:  Vida Roller, M.D.                DATE OF BIRTH:  1926/09/08   DATE OF PROCEDURE:  11/08/2992  DATE OF DISCHARGE:                                   PROGRESS NOTE   INDICATION:  Mr. Foree is a 77 year old male with no known coronary disease  who presents with atypical chest discomfort.  His cardiac enzymes x3 were  not consistent with acute myocardial infarction.  Cardiac risk factors  include history of tobacco abuse, hypertension, male sex, and age.   BASELINE DATA:  EKG revealed sinus rhythm at 91 beats per minute with  nonspecific ST abnormalities.  Blood pressure is 142/78.  The patient  exercised for a total of five minutes to Bruce protocol stage 2 at 7.0 METs.  Maximum heart rate achieved was 138 beats per minute which is 95% of  predicted maximum.  Maximum blood pressure was 188/90.  Test stopped  secondary to fatigue and target heart rate achieved.  EKG revealed no  ischemic changes and no arrhythmias.   Final images and results are pending M.D. review.     ________________________________________  ___________________________________________  Jae Dire, P.A. [facility]                      Vida Roller, M.D.   AB/MEDQ  D:  11/09/2002  T:  11/09/2002  Job:  [id number]

## 2010-07-04 NOTE — Discharge Summary (Signed)
NAME:  Derrick Long, Derrick Long                       ACCOUNT NO.:  [id number]   MEDICAL RECORD NO.:  [id number]                   PATIENT TYPE:  INP   LOCATION:  A211                                 FACILITY:  [facility]   PHYSICIAN:  Hanley Hays. Dechurch, M.D.           DATE OF BIRTH:  08-03-1926   DATE OF ADMISSION:  11/07/2002  DATE OF DISCHARGE:  11/09/2002                                 DISCHARGE SUMMARY   DIAGNOSES:  1. Atypical chest pain.  2. Bradycardia with asymptomatic pause.  3. History of prostate cancer.  4. Probable gastroesophageal reflux.  5. History of prostate cancer status post radium implants.  6. History of hypertension.  7. Status post cholecystectomy.  8. History of tobacco abuse.  9. Mild aortic stenosis.   DISPOSITION:  The patient discharged to home.   MEDICATIONS INCLUDE:  1. Protonix 40 mg daily.  2. Terazosin 10 daily.  3. Lisinopril 20 daily.  4. Aspirin 81 daily.  5. P.r.n. sublingual nitroglycerin.   FOLLOW UP:  Drs. Hill/Smith as scheduled.  Follow up at [facility]  as scheduled.   HOSPITAL COURSE:  [age]-year-old African-American gentleman presented  to the emergency room complaining of chest pain which he described as  burning that radiated from his pubis up to his sternum.  He described  pressure.  He has known no history of coronary disease.  His past medical  history is as noted above.  His enzymes remained normal.  He was scheduled  for an Adenosine-Cardiolite but had a spontaneous episode of bradycardia  with a 3 second pause.  The patient had absolutely no symptoms with this and  we could not elicit any type of symptomatology such as syncope, change in  mental status, light-headedness or shortness of breath or anything else that  would indicate that this was symptomatic.  He was seen in consultation by  [facility], both by Dr. Dorethea Clan as well as Dr. Ladona Ridgel the EP  specialist.  It was felt that since he had no symptoms  pacemaker would not  be warranted as there can actually be some detrimental effects.  The patient  actually underwent a stress test without Adenosine in order to not cause  problems with his rhythms.  He exercised to Bruce Protocol Stage 2, he did  have a hypertensive response to exercise, he had no arrhythmias or chest  pain.  Cardiolite images revealed question of a small area of scar in the  left ventricle however, the quality of the study was poor.  He will be  followed up by cardiology and he was [facility] for discharge by cardiology.  The patient was discharged in stable condition on  the medications as noted above.  He and his family were informed at great  length both by Drs. Ladona Ridgel and Dorethea Clan and myself regarding the bradycardia.  He is to avoid beta blockers or other medications that would slow down his  AV conduction  but he has no other restrictions.  He is discharged to home in  stable condition.     ___________________________________________                                         Hanley Hays Josefine Class, M.D.   FED/MEDQ  D:  11/21/2002  T:  11/21/2002  Job:  [id number]   cc:   Dr. Katrinka Blazing   Dr. Loleta Chance

## 2010-07-04 NOTE — Procedures (Signed)
   NAME:  Derrick Long, Derrick Long                       ACCOUNT NO.:  [id number]   MEDICAL RECORD NO.:  [id number]                   PATIENT TYPE:  INP   LOCATION:  A223                                 FACILITY:  [facility]   PHYSICIAN:  Vida Roller, M.D.                DATE OF BIRTH:  1927/01/26   DATE OF PROCEDURE:  03/27/2002  DATE OF DISCHARGE:                                  ECHOCARDIOGRAM   TAPE NUMBER:  [id number].   TAPE COUNT:  [id number].   PROCEDURE PERFORMED:  Echocardiogram.   CARDIOLOGIST:  Vida Roller, M.D.   INDICATIONS:  Left ventricular function, status post chest pain, question of  an aortic stenosis murmur.   M-MODE MEASUREMENTS:  The aorta measures 36 mm.  The left atrium is 45 mm,  which is enlarged.  The septum is 14 mm, which is enlarged.  The posterior  wall is 11 mm, which is normal.  The left ventricular diastolic dimension is  45 mm.  The left ventricular systolic dimension is 28 mm.   TWO-DIMENSIONAL AND DOPPLER IMAGING:  The left ventricle is normal size and  is mild hyperdynamic with no significant wall motion abnormality seen.  Diastolic function was not assessed.   The right ventricular is normal size with normal systolic function.   The atria are both mildly enlarged.   The aortic valve is mildly sclerotic with evidence of bicuspid aortic valve.  There are mild increased velocities across the valve consistent with a peak  gradient of about 12 mmHg and a mean gradient of 7 mmHg with an estimated  valve area of 2.2 cm squared consistent with mild aortic stenosis.   The mitral valve is morphologically unremarkable with trace mitral  regurgitation.   The tricuspid valve is morphologically unremarkable with mild tricuspid  regurgitation.   The pulmonic valve is not well seen; however, there appears to be at least  mild pulmonic insufficiency.   The pericardial structures are normal.                                                  Vida Roller,  M.D.    JH/MEDQ  D:  03/27/2002  T:  03/28/2002  Job:  [id number]

## 2010-07-04 NOTE — Procedures (Signed)
   NAME:  Derrick Long, Derrick Long                       ACCOUNT NO.:  [id number]   MEDICAL RECORD NO.:  [id number]                   PATIENT TYPE:  INP   LOCATION:  A223                                 FACILITY:  [facility]   PHYSICIAN:  Vida Roller, M.D.                DATE OF BIRTH:  22-Jul-1926   DATE OF PROCEDURE:  DATE OF DISCHARGE:                                    STRESS TEST   ADENOSINE CARDIOLITE   INDICATION:  The patient is a 77 year old male with no known coronary artery  disease who complained of atypical chest pain status post cholecystectomy.  He does have multiple cardiac risk factors including age, male sex, family  history, and hypertension.   DESCRIPTION OF PROCEDURE:  Adenosine 49 mg was infused over four minutes.  Cardiolite was injected at three minutes.  The patient felt chest discomfort  during test which resolved in recovery.  EKG showed no arrhythmias and no  ischemic changes.  Final images and results are pending M.D. review.     Amy Mercy Riding, P.A. [facility]                     Vida Roller, M.D.    AB/MEDQ  D:  03/28/2002  T:  03/28/2002  Job:  [id number]

## 2010-07-04 NOTE — Consult Note (Signed)
NAME:  Derrick Long, EBERLIN                       ACCOUNT NO.:  [id number]   MEDICAL RECORD NO.:  [id number]                   PATIENT TYPE:  INP   LOCATION:  A211                                 FACILITY:  [facility]   PHYSICIAN:  Vida Roller, M.D.                DATE OF BIRTH:  November 20, 1926   DATE OF CONSULTATION:  11/08/2002  DATE OF DISCHARGE:                                   CONSULTATION   PRIMARY CARE PHYSICIAN:  Annia Friendly. Loleta Chance, M.D.   HISTORY OF PRESENT ILLNESS:  Derrick Long is a 77 year old gentleman with no  known coronary artery disease who presents with an episode of epigastric  burning to the emergency department.  He was admitted to rule out unstable  angina.  He has subsequently not had any pain.  He reports to me that he has  been relatively active, able to walk without any difficulty, and gets very  little shortness of breath.  Overall his routine daily of activities of life  have been accomplished without difficulty.  This episode occurred at rest.  It was not associated with any meals.  It began with a burning in his  epigastrium and then went up through his central chest with a brackish taste  in the back of his mouth.  It was not associated with any nausea and  vomiting.  It did not radiate anywhere.  There is no diaphoresis or  palpitations.  The discomfort resolved spontaneously.  He has not had any  since.  He does not smoke and does not drink alcohol. He does not use any  elicit drugs.   MEDICATIONS WHILE IN HOSPITAL:  1. Protonix 40 mg once a day.  2. Terazosin 10 mg a day.  3. Lisinopril 20 mg a day.  4. Aspirin 81 mg a day.  5. Ambien 10 mg at night p.r.n.  6. Sublingual nitroglycerin p.r.n.   FAMILY HISTORY:  His mother died at age 42 secondary of complications of  heart disease.  Father died at age 33 of heart disease.  He has four  brothers who are deceased, the cause is unknown.  He has one living son at  age 40 with a history of hypertension, two  daughters who are living at ages  46 with a history of hypertension and age 48 with health unknown.   REVIEW OF SYSTEMS:  Essential negative.   PAST MEDICAL HISTORY:  1. Hospitalized with esophagitis and gastroenteritis in April 2001.  2. Hospitalized most recently in February 2004, where he underwent a     cardiology evaluation and then a cholecystectomy.  We were asked to see     him for chest discomfort very similar to the chest discomfort he had the     last time we saw him.  3. He does have a history of hypertension and prostate cancer.  He had     cesium treatment for his prostate  a number of years ago.   PHYSICAL EXAMINATION:  GENERAL:  Well-developed well-nourished African  American male in no apparent distress, who is alert and oriented x4.  VITAL SIGNS:  Blood pressure 136/64, heart rate 80, respiratory rate 18.  He  is afebrile.  HEENT:  Unremarkable.  NECK:  Supple.  There is no jugular venous distention or carotid bruits.  CHEST:  Clear to auscultation.  CARDIAC:  Reveals a nondisplaced point of maximal impulse with no lifts or  thrills.  First and second heart sounds are normal.  There is no third or  fourth heart sound.  There is a 2/6 diamond shaped systolic murmur heard  best at the right upper sternal border which does not radiate.  ABDOMEN:  Soft, nontender, with normal active bowel sounds.  GENITOURINARY/RECTAL:  Deferred.  EXTREMITIES:  Without clubbing, cyanosis, or edema.  He has 2+ pulses  throughout.  NEUROLOGIC:  Nonfocal.   LABORATORY DATA:  On note, on his last hospitalization he had an adenosine  Cardiolite, which showed no evidence of significant coronary disease.  It  was interpreted as a very low risk scan with mild diaphragmatic attenuation.  A chest x-ray shows stable mediastinal cardiac silhouette.  Stable  enlargement of his ascending aorta.  White blood cell count is 4.7, H&H of  12 and 35 with a platelet count of 243.  His Pro time is 13.4,  PTT of 32,  INR of 1.  Liver function studies are within normal limits.  He did not have  any routine chemistries.  His amylase and lipase are normal.  Two sets of  cardiac enzymes are inconsistent with acute myocardial infarction.  Electrocardiogram shows sinus rhythm with a first degree AV block.  There  are no Q waves concerning for an old myocardial infarction.  No ST-T wave  changes concerning for ischemia.   ASSESSMENT:  This is a gentleman with atypical chest discomfort who had a  recent ischemic evaluation, which was negative.  His electrocardiogram is  without ischemia.  He has normal cardiac enzymes.  I suspect this is not an  ischemic etiology for his chest discomfort.  As far as his chest discomfort  is concerned, it sounds to me it is likely to be gastroesophageal reflux  disease and I would pursue that further.  We will put him in for an  adenosine Cardiolite as a repeat study.  If that is normal I think there  should be no further cardiac evaluation performed.      ___________________________________________                                            Vida Roller, M.D.   JH/MEDQ  D:  11/08/2002  T:  11/08/2002  Job:  [id number]

## 2011-08-13 ENCOUNTER — Other Ambulatory Visit (HOSPITAL_COMMUNITY): Payer: Self-pay | Admitting: Family Medicine

## 2011-08-13 ENCOUNTER — Ambulatory Visit (HOSPITAL_COMMUNITY)
Admission: RE | Admit: 2011-08-13 | Discharge: 2011-08-13 | Disposition: A | Discharge status: Home / Self Care | Payer: Medicare Other | Source: Ambulatory Visit | Attending: Family Medicine | Admitting: Family Medicine

## 2011-08-13 DIAGNOSIS — M542 Cervicalgia: Secondary | ICD-10-CM | POA: Insufficient documentation

## 2011-08-13 DIAGNOSIS — I6529 Occlusion and stenosis of unspecified carotid artery: Secondary | ICD-10-CM | POA: Insufficient documentation

## 2011-08-13 DIAGNOSIS — M47812 Spondylosis without myelopathy or radiculopathy, cervical region: Secondary | ICD-10-CM | POA: Insufficient documentation

## 2011-10-28 ENCOUNTER — Other Ambulatory Visit (HOSPITAL_COMMUNITY): Payer: Self-pay | Admitting: Family Medicine

## 2011-10-28 DIAGNOSIS — M8580 Other specified disorders of bone density and structure, unspecified site: Secondary | ICD-10-CM

## 2011-11-05 ENCOUNTER — Other Ambulatory Visit (HOSPITAL_COMMUNITY): Payer: Medicare Other

## 2011-11-06 ENCOUNTER — Ambulatory Visit (HOSPITAL_COMMUNITY)
Admission: RE | Admit: 2011-11-06 | Discharge: 2011-11-06 | Disposition: A | Discharge status: Home / Self Care | Payer: Medicare Other | Source: Ambulatory Visit | Attending: Family Medicine | Admitting: Family Medicine

## 2011-11-06 DIAGNOSIS — M949 Disorder of cartilage, unspecified: Secondary | ICD-10-CM | POA: Insufficient documentation

## 2011-11-06 DIAGNOSIS — M899 Disorder of bone, unspecified: Secondary | ICD-10-CM | POA: Insufficient documentation

## 2011-11-06 DIAGNOSIS — M8580 Other specified disorders of bone density and structure, unspecified site: Secondary | ICD-10-CM

## 2012-02-17 DIAGNOSIS — I480 Paroxysmal atrial fibrillation: Secondary | ICD-10-CM

## 2012-02-17 HISTORY — DX: Paroxysmal atrial fibrillation: I48.0

## 2012-07-19 ENCOUNTER — Emergency Department (HOSPITAL_COMMUNITY): Payer: Medicare Other

## 2012-07-19 ENCOUNTER — Encounter (HOSPITAL_COMMUNITY): Payer: Self-pay

## 2012-07-19 ENCOUNTER — Inpatient Hospital Stay (HOSPITAL_COMMUNITY)
Admission: EM | Admit: 2012-07-19 | Discharge: 2012-07-23 | DRG: 243 | Disposition: A | Discharge status: Home / Self Care | Payer: Medicare Other | Attending: Internal Medicine | Admitting: Internal Medicine

## 2012-07-19 DIAGNOSIS — K219 Gastro-esophageal reflux disease without esophagitis: Secondary | ICD-10-CM | POA: Diagnosis present

## 2012-07-19 DIAGNOSIS — E876 Hypokalemia: Secondary | ICD-10-CM | POA: Diagnosis present

## 2012-07-19 DIAGNOSIS — I200 Unstable angina: Secondary | ICD-10-CM

## 2012-07-19 DIAGNOSIS — R55 Syncope and collapse: Secondary | ICD-10-CM | POA: Diagnosis present

## 2012-07-19 DIAGNOSIS — E785 Hyperlipidemia, unspecified: Secondary | ICD-10-CM | POA: Diagnosis present

## 2012-07-19 DIAGNOSIS — R001 Bradycardia, unspecified: Secondary | ICD-10-CM | POA: Diagnosis present

## 2012-07-19 DIAGNOSIS — I455 Other specified heart block: Secondary | ICD-10-CM

## 2012-07-19 DIAGNOSIS — I440 Atrioventricular block, first degree: Secondary | ICD-10-CM | POA: Diagnosis present

## 2012-07-19 DIAGNOSIS — N179 Acute kidney failure, unspecified: Secondary | ICD-10-CM | POA: Diagnosis present

## 2012-07-19 DIAGNOSIS — I359 Nonrheumatic aortic valve disorder, unspecified: Secondary | ICD-10-CM | POA: Diagnosis present

## 2012-07-19 DIAGNOSIS — R0789 Other chest pain: Secondary | ICD-10-CM | POA: Diagnosis present

## 2012-07-19 DIAGNOSIS — Z79899 Other long term (current) drug therapy: Secondary | ICD-10-CM

## 2012-07-19 DIAGNOSIS — N289 Disorder of kidney and ureter, unspecified: Secondary | ICD-10-CM | POA: Diagnosis present

## 2012-07-19 DIAGNOSIS — I498 Other specified cardiac arrhythmias: Secondary | ICD-10-CM

## 2012-07-19 DIAGNOSIS — I10 Essential (primary) hypertension: Secondary | ICD-10-CM | POA: Diagnosis present

## 2012-07-19 HISTORY — DX: Personal history of nicotine dependence: Z87.891

## 2012-07-19 HISTORY — DX: Disorder of prostate, unspecified: N42.9

## 2012-07-19 HISTORY — DX: Essential (primary) hypertension: I10

## 2012-07-19 HISTORY — DX: Gastro-esophageal reflux disease without esophagitis: K21.9

## 2012-07-19 LAB — TROPONIN I
Troponin I: <0.3 ng/mL (ref <0.30)
Troponin I: <0.3 ng/mL (ref <0.30)
Troponin I: <0.3 ng/mL (ref <0.30)

## 2012-07-19 LAB — CBC
MCH: 32.8 pg (ref 26.0–34.0)
MCHC: 35.2 g/dL (ref 30.0–36.0)
MCV: 93.3 fL (ref 78.0–100.0)
Platelets: 173 10*3/uL (ref 150–400)
RDW: 12.9 % (ref 11.5–15.5)
WBC: 5.1 10*3/uL (ref 4.0–10.5)

## 2012-07-19 LAB — BASIC METABOLIC PANEL
Calcium: 9.3 mg/dL (ref 8.4–10.5)
Chloride: 105 mEq/L (ref 96–112)
Creatinine, Ser: 1.54 mg/dL — ABNORMAL HIGH (ref 0.50–1.35)
GFR calc Af Amer: 46 mL/min — ABNORMAL LOW (ref >90)
GFR calc non Af Amer: 40 mL/min — ABNORMAL LOW (ref >90)

## 2012-07-19 LAB — URINALYSIS, ROUTINE W REFLEX MICROSCOPIC
Bilirubin Urine: NEGATIVE
Ketones, ur: NEGATIVE mg/dL
Nitrite: NEGATIVE
Protein, ur: NEGATIVE mg/dL
Urobilinogen, UA: 1 mg/dL (ref 0.0–1.0)

## 2012-07-19 LAB — MRSA PCR SCREENING: MRSA by PCR: NEGATIVE

## 2012-07-19 MED ORDER — PANTOPRAZOLE SODIUM 40 MG PO TBEC
40.0000 mg | DELAYED_RELEASE_TABLET | Freq: Every day | ORAL | Status: DC
  Administered 2012-07-20 – 2012-07-23 (×4): 40 mg via ORAL
  Filled 2012-07-19 (×4): qty 1

## 2012-07-19 MED ORDER — ZOLPIDEM TARTRATE 5 MG PO TABS
5.0000 mg | ORAL_TABLET | Freq: Every evening | ORAL | Status: DC | PRN
  Administered 2012-07-19: 5 mg via ORAL
  Filled 2012-07-19: qty 1

## 2012-07-19 MED ORDER — TEMAZEPAM 15 MG PO CAPS: 15.0000 mg | ORAL_CAPSULE | Freq: Every evening | ORAL | Status: DC | PRN

## 2012-07-19 MED ORDER — HEPARIN BOLUS VIA INFUSION
4000.0000 [IU] | Freq: Once | INTRAVENOUS | Status: AC
  Administered 2012-07-19: 4000 [IU] via INTRAVENOUS

## 2012-07-19 MED ORDER — SODIUM CHLORIDE 0.9 % IV SOLN
Freq: Once | INTRAVENOUS | Status: AC
  Administered 2012-07-19: 10:00:00 via INTRAVENOUS

## 2012-07-19 MED ORDER — B-12 1000 MCG PO CAPS: 1.0000 | ORAL_CAPSULE | Freq: Every day | ORAL | Status: DC

## 2012-07-19 MED ORDER — VITAMIN B-12 1000 MCG PO TABS
1000.0000 ug | ORAL_TABLET | Freq: Every day | ORAL | Status: DC
  Administered 2012-07-20 – 2012-07-23 (×4): 1000 ug via ORAL
  Filled 2012-07-19 (×4): qty 1

## 2012-07-19 MED ORDER — HEPARIN (PORCINE) IN NACL 100-0.45 UNIT/ML-% IJ SOLN
1000.0000 [IU]/h | INTRAMUSCULAR | Status: DC
  Administered 2012-07-19 (×2): 1000 [IU]/h via INTRAVENOUS
  Filled 2012-07-19: qty 250

## 2012-07-19 MED ORDER — ASPIRIN EC 81 MG PO TBEC
81.0000 mg | DELAYED_RELEASE_TABLET | Freq: Every day | ORAL | Status: DC
  Administered 2012-07-20 – 2012-07-23 (×4): 81 mg via ORAL
  Filled 2012-07-19 (×4): qty 1

## 2012-07-19 MED ORDER — SODIUM CHLORIDE 0.9 % IV SOLN
INTRAVENOUS | Status: DC
  Administered 2012-07-19 (×3): via INTRAVENOUS

## 2012-07-19 MED ORDER — HYDROCODONE-ACETAMINOPHEN 5-325 MG PO TABS
1.0000 | ORAL_TABLET | Freq: Two times a day (BID) | ORAL | Status: DC | PRN
  Administered 2012-07-19: 1 via ORAL
  Filled 2012-07-19: qty 1

## 2012-07-19 MED ORDER — NIACIN 500 MG PO TABS
500.0000 mg | ORAL_TABLET | Freq: Every day | ORAL | Status: DC
  Administered 2012-07-19 – 2012-07-23 (×5): 500 mg via ORAL
  Filled 2012-07-19 (×5): qty 1

## 2012-07-19 MED ORDER — NITROGLYCERIN 0.4 MG SL SUBL: 0.4000 mg | SUBLINGUAL_TABLET | SUBLINGUAL | Status: DC | PRN

## 2012-07-19 MED ORDER — NITROGLYCERIN IN D5W 200-5 MCG/ML-% IV SOLN
5.0000 ug/min | INTRAVENOUS | Status: DC
  Administered 2012-07-19: 5 ug/min via INTRAVENOUS
  Administered 2012-07-19: 10 ug/min via INTRAVENOUS
  Filled 2012-07-19: qty 250

## 2012-07-19 MED ORDER — MORPHINE SULFATE 4 MG/ML IJ SOLN
4.0000 mg | Freq: Once | INTRAMUSCULAR | Status: AC
  Administered 2012-07-19: 4 mg via INTRAVENOUS
  Filled 2012-07-19: qty 1

## 2012-07-19 MED ORDER — NITROGLYCERIN 0.4 MG SL SUBL
0.4000 mg | SUBLINGUAL_TABLET | SUBLINGUAL | Status: AC | PRN
  Administered 2012-07-19 – 2012-07-21 (×3): 0.4 mg via SUBLINGUAL
  Filled 2012-07-19 (×2): qty 25

## 2012-07-19 MED ORDER — ASPIRIN 81 MG PO CHEW
324.0000 mg | CHEWABLE_TABLET | Freq: Once | ORAL | Status: AC
  Administered 2012-07-19: 324 mg via ORAL
  Filled 2012-07-19: qty 4

## 2012-07-19 MED ORDER — AMLODIPINE BESYLATE 10 MG PO TABS
10.0000 mg | ORAL_TABLET | Freq: Every day | ORAL | Status: DC
  Administered 2012-07-21 – 2012-07-23 (×3): 10 mg via ORAL
  Filled 2012-07-19 (×4): qty 1

## 2012-07-19 MED ORDER — SODIUM CHLORIDE 0.9 % IV SOLN
INTRAVENOUS | Status: DC
  Administered 2012-07-19: 16:00:00 via INTRAVENOUS

## 2012-07-19 MED ORDER — NITROGLYCERIN IN D5W 200-5 MCG/ML-% IV SOLN
5.0000 ug/min | INTRAVENOUS | Status: DC
  Administered 2012-07-19: 10 ug/min via INTRAVENOUS

## 2012-07-19 MED ORDER — POTASSIUM CHLORIDE CRYS ER 20 MEQ PO TBCR
40.0000 meq | EXTENDED_RELEASE_TABLET | Freq: Once | ORAL | Status: AC
  Administered 2012-07-19: 40 meq via ORAL
  Filled 2012-07-19: qty 2

## 2012-07-19 MED ORDER — ONDANSETRON HCL 4 MG/2ML IJ SOLN
4.0000 mg | Freq: Four times a day (QID) | INTRAMUSCULAR | Status: DC | PRN
  Administered 2012-07-21: 4 mg via INTRAVENOUS
  Filled 2012-07-19: qty 2

## 2012-07-19 MED ORDER — HEPARIN (PORCINE) IN NACL 100-0.45 UNIT/ML-% IJ SOLN
1000.0000 [IU]/h | INTRAMUSCULAR | Status: DC
  Administered 2012-07-19 (×2): 1000 [IU]/h via INTRAVENOUS

## 2012-07-19 MED ORDER — ACETAMINOPHEN 325 MG PO TABS: 650.0000 mg | ORAL_TABLET | ORAL | Status: DC | PRN

## 2012-07-19 MED ORDER — TAMSULOSIN HCL 0.4 MG PO CAPS
0.4000 mg | ORAL_CAPSULE | Freq: Every day | ORAL | Status: DC
  Administered 2012-07-19 – 2012-07-23 (×5): 0.4 mg via ORAL
  Filled 2012-07-19 (×5): qty 1

## 2012-07-19 MED ORDER — ENOXAPARIN SODIUM 40 MG/0.4ML ~~LOC~~ SOLN
40.0000 mg | SUBCUTANEOUS | Status: DC
  Administered 2012-07-19 – 2012-07-21 (×3): 40 mg via SUBCUTANEOUS
  Filled 2012-07-19 (×5): qty 0.4

## 2012-07-19 MED ORDER — ATORVASTATIN CALCIUM 20 MG PO TABS
20.0000 mg | ORAL_TABLET | Freq: Every day | ORAL | Status: DC
  Administered 2012-07-19 – 2012-07-21 (×3): 20 mg via ORAL
  Filled 2012-07-19 (×5): qty 1

## 2012-07-19 MED ORDER — SODIUM CHLORIDE 0.9 % IV SOLN
INTRAVENOUS | Status: AC
  Administered 2012-07-19: 13:00:00 via INTRAVENOUS

## 2012-07-19 MED ORDER — ONDANSETRON HCL 4 MG/2ML IJ SOLN
4.0000 mg | Freq: Once | INTRAMUSCULAR | Status: AC
  Administered 2012-07-19: 4 mg via INTRAVENOUS
  Filled 2012-07-19: qty 2

## 2012-07-19 NOTE — ED Provider Notes (Addendum)
History    This chart was scribed for Derrick Human, MD by Quintella Reichert, ED scribe.  This patient was seen in room APA06/[facility]06 and the patient's care was started at 8:58 AM.   CSN: [id number]  Arrival date & time 07/19/12  0846     Chief Complaint  Patient presents with  . Chest Pain     The history is provided by the patient and a relative. No language interpreter was used.    HPI Comments: YOMAR MEJORADO is a 79 y.o. male brought by EMS to the Emergency Department complaining of a syncopal episode that occurred pta, with subsequent moderate left chest pain.  Pt's family reports that they found him lying in the grass next to the chair he was sitting in.  Pt does not recall LOC and he denies CP prior to episode.  Pain is localized to left anterior chest and rated at a severity of 5/10, and pt notes it does not radiate.  He denies SOB, numbness, or leg swelling.  He denies prior h/o syncopal episodes, heart attacks or any known heart problems.  Pt medicates regularly for HTN.  He does not smoke or drink.   PCP is Dr. Sudie Bailey.   Past Medical History  Diagnosis Date  . Hypertension   . Acid reflux   . Prostate disease     Past Surgical History  Procedure Laterality Date  . Prostate surgery      No family history on file.  History  Substance Use Topics  . Smoking status: Not on file  . Smokeless tobacco: Not on file  . Alcohol Use: No      Review of Systems  Respiratory: Negative for shortness of breath.   Cardiovascular: Positive for chest pain. Negative for leg swelling.  Neurological: Positive for syncope. Negative for numbness.  All other systems reviewed and are negative.    Allergies  Review of patient's allergies indicates no known allergies.  Home Medications   Current Outpatient Rx  Name  Route  Sig  Dispense  Refill  . AMLODIPINE BESYLATE PO   Oral   Take 10 mg by mouth daily.         . cetirizine (ZYRTEC) 10 MG tablet   Oral  Take 10 mg by mouth daily.         . Cyanocobalamin (B-12) 1000 MCG CAPS   Oral   Take 1 tablet by mouth daily.         . furosemide (LASIX) 40 MG tablet   Oral   Take 40 mg by mouth daily.         . hydrochlorothiazide (HYDRODIURIL) 25 MG tablet   Oral   Take 25 mg by mouth daily.         Marland Kitchen losartan (COZAAR) 100 MG tablet   Oral   Take 100 mg by mouth daily.         . Nebivolol HCl (BYSTOLIC) 20 MG TABS   Oral   Take 20 mg by mouth daily.         . niacin 500 MG tablet   Oral   Take 500 mg by mouth daily.         Marland Kitchen omeprazole (PRILOSEC) 20 MG capsule   Oral   Take 20 mg by mouth daily.         . Saw Palmetto 450 MG CAPS   Oral   Take 450 mg by mouth daily.         Marland Kitchen  tamsulosin (FLOMAX) 0.4 MG CAPS   Oral   Take 0.4 mg by mouth daily.         . temazepam (RESTORIL) 15 MG capsule   Oral   Take 15 mg by mouth at bedtime as needed for sleep.         Marland Kitchen HYDROcodone-acetaminophen (NORCO/VICODIN) 5-325 MG per tablet   Oral   Take 1 tablet by mouth 2 (two) times daily as needed for pain.           BP 142/55  Pulse 57  Temp(Src) 97.9 F (36.6 C) (Oral)  Resp 16  SpO2 96%  Physical Exam  Nursing note and vitals reviewed. Constitutional: He is oriented to person, place, and time. He appears well-developed and well-nourished. No distress.  HENT:  Head: Normocephalic and atraumatic.  Mouth/Throat: Oropharynx is clear and moist. No oropharyngeal exudate.  Several teeth are absent  Eyes: Conjunctivae and EOM are normal. Pupils are equal, round, and reactive to light. Right eye exhibits no discharge. Left eye exhibits no discharge.  Neck: Normal range of motion. Neck supple. No tracheal deviation present.  Cardiovascular: Normal rate, regular rhythm and normal heart sounds.   No murmur heard. Pulmonary/Chest: Effort normal and breath sounds normal. No respiratory distress. He has no wheezes. He has no rales.  Abdominal: Soft. Bowel sounds are  normal. There is no tenderness.  Neurological: He is alert and oriented to person, place, and time. No cranial nerve deficit. He exhibits normal muscle tone. Coordination normal.  Neurologically intact  Skin: Skin is warm and dry.  Psychiatric: He has a normal mood and affect. His behavior is normal.    ED Course  CRITICAL CARE Performed by: Derrick Long Authorized by: Derrick Long Total critical care time: 30 minutes Critical care was necessary to treat or prevent imminent or life-threatening deterioration of the following conditions: 79 yo man with syncope and chest pain, required intravenous nitroglycerin and heparin, transfer to a stepdown unit at [facility]. Critical care was time spent personally by me on the following activities: discussions with consultants, evaluation of patient's response to treatment, examination of patient, obtaining history from patient or surrogate, ordering and performing treatments and interventions, ordering and review of laboratory studies, ordering and review of radiographic studies, pulse oximetry, re-evaluation of patient's condition and review of old charts.   (including critical care time)  DIAGNOSTIC STUDIES: Oxygen Saturation is 96% on room air, normal by my interpretation.    COORDINATION OF CARE: 9:02 AM-Discussed treatment plan which includes CXR, labs, and pain medication with pt at bedside and pt agreed to plan.   10:17 AM: Informed pt that labs and imaging did not show heart attack.  Presently pt still reports a "heavy heart."  Discussed treatment plan including pain medication and transfer to [facility].  Pt expressed understanding and was agreeable.  8:52 AM  Date: 07/19/2012  Rate:56  Rhythm: sinus bradycardia  QRS Axis: normal  Intervals: PR prolonged  QRS: Poor R wave progression in precordial leads suggests old anterior myocardial infarction  ST/T Wave abnormalities: nonspecific T wave changes  Conduction  Disutrbances:first-degree A-V block  Narrative Interpretation: Abnormal EKG  Old EKG Reviewed: unchanged   Results for orders placed during the hospital encounter of 07/19/12  CBC      Result Value Range   WBC 5.1  4.0 - 10.5 K/uL   RBC 3.72 (*) 4.22 - 5.81 MIL/uL   Hemoglobin 12.2 (*) 13.0 - 17.0 g/dL   HCT  34.7 (*) 39.0 - 52.0 %   MCV 93.3  78.0 - 100.0 fL   MCH 32.8  26.0 - 34.0 pg   MCHC 35.2  30.0 - 36.0 g/dL   RDW 29.5  62.1 - 30.8 %   Platelets 173  150 - 400 K/uL  BASIC METABOLIC PANEL      Result Value Range   Sodium 140  135 - 145 mEq/L   Potassium 3.4 (*) 3.5 - 5.1 mEq/L   Chloride 105  96 - 112 mEq/L   CO2 25  19 - 32 mEq/L   Glucose, Bld 132 (*) 70 - 99 mg/dL   BUN 24 (*) 6 - 23 mg/dL   Creatinine, Ser 6.57 (*) 0.50 - 1.35 mg/dL   Calcium 9.3  8.4 - 84.6 mg/dL   GFR calc non Af Amer 40 (*) >90 mL/min   GFR calc Af Amer 46 (*) >90 mL/min  TROPONIN I      Result Value Range   Troponin I <0.30  <0.30 ng/mL    Dg Chest Port 1 View  07/19/2012   *RADIOLOGY REPORT*  Clinical Data: Chest pain.  PORTABLE CHEST - 1 VIEW  Comparison: 10/07/2009  Findings: The heart size and pulmonary vascularity are normal and the lungs are clear.  No acute osseous abnormality.  IMPRESSION: No acute disease.   Original Report Authenticated By: Francene Boyers, M.D.   10:13 AM  Date: 07/19/2012  Rate: 56  Rhythm: sinus bradycardia  QRS Axis: normal  Intervals: PR prolonged QRS:  Poor R wave progression in precordial leads suggests old anterior myocardial infarction.  ST/T Wave abnormalities: nonspecific T wave changes  Conduction Disutrbances:first-degree A-V block   Narrative Interpretation: Abnormal EKG  Old EKG Reviewed: unchanged   10:22 AM Pt continues with chest pain, rated at a 5.  Lab workup did not show MI.  Will start IV heparin and NTG.  Will call [facility] to transfer pt for syncope, unstable angina.  11:07 AM Case discussed with [place] Hilty,  M.D., cardiologist, who accepts pt in transfer to a stepdown unit at [facility].    1. Syncope   2. Unstable angina    I personally performed the services described in this documentation, which was scribed in my presence. The recorded information has been reviewed and is accurate.  Derrick Human, MD        Carleene Cooper III, MD 07/19/12 1112     Carleene Cooper III, MD 07/19/12 [phone]

## 2012-07-19 NOTE — ED Notes (Signed)
Per EMS, pt had been sitting outside in a chair. Was found by family lying on the  Grass. Pt states his heart hurts little

## 2012-07-19 NOTE — H&P (Signed)
Derrick Long is an 79 y.o. male.   Chief Complaint: Chest pain/Syncope HPI:   The patient is an 79 year old African American male with history of hypertension, acid reflux, long first degree AV block, bradycardia, remote tobacco abuse(quit 25 yrs ago) and prostate disease. He has no prior history of coronary disease or MI.  Patient states that he was out working in the garden type is tomatoes. He had gone out to the mailbox to get the paper and felt a little dizzy. He came back sat down to read the newspaper the next thing he remembers is being put into an ambulance.  He does report some chest pain currently 3/10 in intensity worse with palpation and inspiration. He denies nausea, vomiting, fever, shortness of breath, orthopnea, PND, lower extremity edema, abdominal pain, hematochezia, melena, cough, congestion, sore throat  Past Medical History  Diagnosis Date  . Hypertension   . Acid reflux   . Prostate disease     Past Surgical History  Procedure Laterality Date  . Prostate surgery      No family history on file. Social History:  reports that he does not drink alcohol or use illicit drugs. His tobacco history is not on file.  Allergies: No Known Allergies  Medications Prior to Admission  Medication Sig Dispense Refill  . AMLODIPINE BESYLATE PO Take 10 mg by mouth daily.      . cetirizine (ZYRTEC) 10 MG tablet Take 10 mg by mouth daily.      . Cyanocobalamin (B-12) 1000 MCG CAPS Take 1 tablet by mouth daily.      . furosemide (LASIX) 40 MG tablet Take 40 mg by mouth daily.      . hydrochlorothiazide (HYDRODIURIL) 25 MG tablet Take 25 mg by mouth daily.      Marland Kitchen losartan (COZAAR) 100 MG tablet Take 100 mg by mouth daily.      . Nebivolol HCl (BYSTOLIC) 20 MG TABS Take 20 mg by mouth daily.      . niacin 500 MG tablet Take 500 mg by mouth daily.      Marland Kitchen omeprazole (PRILOSEC) 20 MG capsule Take 20 mg by mouth daily.      . Saw Palmetto 450 MG CAPS Take 450 mg by mouth daily.      .  tamsulosin (FLOMAX) 0.4 MG CAPS Take 0.4 mg by mouth daily.      . temazepam (RESTORIL) 15 MG capsule Take 15 mg by mouth at bedtime as needed for sleep.      Marland Kitchen HYDROcodone-acetaminophen (NORCO/VICODIN) 5-325 MG per tablet Take 1 tablet by mouth 2 (two) times daily as needed for pain.        Results for orders placed during the hospital encounter of 07/19/12 (from the past 48 hour(s))  CBC     Status: Abnormal   Collection Time    07/19/12  8:55 AM      Result Value Range   WBC 5.1  4.0 - 10.5 K/uL   RBC 3.72 (*) 4.22 - 5.81 MIL/uL   Hemoglobin 12.2 (*) 13.0 - 17.0 g/dL   HCT 11.9 (*) 14.7 - 82.9 %   MCV 93.3  78.0 - 100.0 fL   MCH 32.8  26.0 - 34.0 pg   MCHC 35.2  30.0 - 36.0 g/dL   RDW 56.2  13.0 - 86.5 %   Platelets 173  150 - 400 K/uL  BASIC METABOLIC PANEL     Status: Abnormal   Collection Time  07/19/12  8:55 AM      Result Value Range   Sodium 140  135 - 145 mEq/L   Potassium 3.4 (*) 3.5 - 5.1 mEq/L   Chloride 105  96 - 112 mEq/L   CO2 25  19 - 32 mEq/L   Glucose, Bld 132 (*) 70 - 99 mg/dL   BUN 24 (*) 6 - 23 mg/dL   Creatinine, Ser 1.61 (*) 0.50 - 1.35 mg/dL   Calcium 9.3  8.4 - 09.6 mg/dL   GFR calc non Af Amer 40 (*) >90 mL/min   GFR calc Af Amer 46 (*) >90 mL/min   Comment:            The eGFR has been calculated     using the CKD EPI equation.     This calculation has not been     validated in all clinical     situations.     eGFR's persistently     <90 mL/min signify     possible Chronic Kidney Disease.  TROPONIN I     Status: None   Collection Time    07/19/12  8:55 AM      Result Value Range   Troponin I <0.30  <0.30 ng/mL   Comment:            Due to the release kinetics of cTnI,     a negative result within the first hours     of the onset of symptoms does not rule out     myocardial infarction with certainty.     If myocardial infarction is still suspected,     repeat the test at appropriate intervals.   Dg Chest Port 1 View  07/19/2012    *RADIOLOGY REPORT*  Clinical Data: Chest pain.  PORTABLE CHEST - 1 VIEW  Comparison: 10/07/2009  Findings: The heart size and pulmonary vascularity are normal and the lungs are clear.  No acute osseous abnormality.  IMPRESSION: No acute disease.   Original Report Authenticated By: Francene Boyers, M.D.    Review of Systems  Constitutional: Negative for fever and diaphoresis.  HENT: Negative for congestion and sore throat.   Eyes: Negative for blurred vision and double vision.  Respiratory: Negative for cough and shortness of breath.   Cardiovascular: Positive for chest pain (Left sided.  Worse with inspiration and palpation.). Negative for orthopnea, leg swelling and PND.  Gastrointestinal: Negative for nausea, vomiting, abdominal pain, blood in stool and melena.  Genitourinary: Negative for hematuria.  Neurological: Positive for dizziness and loss of consciousness.    Blood pressure 142/55, pulse 50, temperature 97.9 F (36.6 C), temperature source Oral, resp. rate 19, height 6\' 1"  (1.854 m), weight 210 lb (95.255 kg), SpO2 98.00%. Physical Exam  Constitutional: He is oriented to person, place, and time. He appears well-developed and well-nourished. No distress.  HENT:  Head: Normocephalic and atraumatic.  Eyes: EOM are normal. Pupils are equal, round, and reactive to light. No scleral icterus.  Neck: Normal range of motion. Neck supple. No JVD present.  Cardiovascular: Regular rhythm, S1 normal and S2 normal.  Bradycardia present.   Murmur heard.  Systolic murmur is present with a grade of 1/6  Pulses:      Radial pulses are 2+ on the right side, and 2+ on the left side.       Dorsalis pedis pulses are 2+ on the right side, and 2+ on the left side.  No Carotid Bruits.  Respiratory: Effort normal and breath  sounds normal. No respiratory distress. He has no wheezes. He has no rales. He exhibits tenderness.  GI: Soft. Bowel sounds are normal. He exhibits no distension. There is no  tenderness.  Musculoskeletal: He exhibits no edema.  Lymphadenopathy:    He has no cervical adenopathy.  Neurological: He is alert and oriented to person, place, and time. He exhibits normal muscle tone.  Skin: Skin is warm and dry.  Psychiatric: He has a normal mood and affect.     Assessment/Plan Principal Problem:   Syncope Active Problems:   Bradycardia   HYPERLIPIDEMIA   HYPERTENSION   First degree AV block, PR interval 370 ms   Chest pain, atypical   Acute renal insufficiency   Hypokalemia  Plan:  Patient's chest pain is atypical and musculoskeletal in nature. It is worse with inspiration and palpation of chest wall.  I do not think he requires IV heparin at this time.  Morphine for pain.  I may use Toradol, however, will use it sparingly given his acute renal insufficiency.  Hydrate at 49ml/hr for one liter.  AM BMET.   Regardless, continue to cycle troponin, check lipids, TSH A1C, magnesium.  Add statin and reassess after lipid panel results.  The patient does have a rather long first degree AV block and bradycardia.  Discontinue beta blocker.  His EKG shows nonspecific T-wave changes and is essentially unchanged from prior EKG in November of 2011(although November EKG had Q waves in V1 2 and 3).   The patient had appropriate response to orthostatic blood pressure checks. Continue to monitor on telemetry in case patient's syncopal episode is related to arrhythmia.  HR did drop to 37 while typing this note.  I wonder whether bradycardia is cause of syncope.   A  PPM may be indicated but lets see how he does off the Bystolic.  We'll check 2-D echocardiogram.  EKG in the AM.   Continue amlodipine, HCTZ.  Add potassium x 1.   HAGER, BRYAN 07/19/2012, 1:50 PM    Agree with note written by [facility]  Pt admitted in transfer from [facility]. Had syncope. No prior history except for Rx HTN. Has point Left anterior chest tightness, doubt anginal. Exam otherwise notable for soft  outflow murmur. Labs OK. Tele with SB. Agree with holding BB and adjusting meds (avoid neg chronotopes).Will get 2D echo for LV fxn and R/O AS. Will need OP event monitor. Prob D/C home tomorrow.   Runell Gess 07/19/2012 3:22 PM

## 2012-07-19 NOTE — ED Notes (Signed)
[facility] called and received report 

## 2012-07-19 NOTE — ED Notes (Signed)
Per Dr. Ignacia Palma, hold 3rd dose of Nitro due to bp.

## 2012-07-20 DIAGNOSIS — R0789 Other chest pain: Secondary | ICD-10-CM

## 2012-07-20 DIAGNOSIS — I059 Rheumatic mitral valve disease, unspecified: Secondary | ICD-10-CM

## 2012-07-20 LAB — BASIC METABOLIC PANEL
CO2: 22 mEq/L (ref 19–32)
Glucose, Bld: 134 mg/dL — ABNORMAL HIGH (ref 70–99)
Potassium: 3.7 mEq/L (ref 3.5–5.1)
Sodium: 137 mEq/L (ref 135–145)

## 2012-07-20 NOTE — Progress Notes (Signed)
  Echocardiogram 2D Echocardiogram has been performed.  Derrick Long FRANCES 07/20/2012, 5:23 PM

## 2012-07-20 NOTE — Progress Notes (Signed)
Pt transferred to 2002 per MD order. Report called to receiving nurse and all questions answered.

## 2012-07-20 NOTE — Progress Notes (Signed)
UR Completed Litsy Epting Graves-Bigelow, RN,BSN [phone]  

## 2012-07-20 NOTE — Care Management Note (Unsigned)
    Page 1 of 1   07/20/2012     4:03:25 PM   CARE MANAGEMENT NOTE 07/20/2012  Patient:  Derrick Long, Derrick Long   Account Number:  [id number]  Date Initiated:  07/20/2012  Documentation initiated by:  GRAVES-BIGELOW,Corrion Stirewalt  Subjective/Objective Assessment:   Pt admitted as a transfer from [facility]. Pt in with cp and bradycardia. Cardilogy is tweaking medications.     Action/Plan:   CM will continue to monitor for disposition needs.   Anticipated DC Date:  07/22/2012   Anticipated DC Plan:  HOME W HOME HEALTH SERVICES      DC Planning Services  CM consult      Choice offered to / List presented to:             Status of service:  In process, will continue to follow Medicare Important Message given?   (If response is "NO", the following Medicare IM given date fields will be blank) Date Medicare IM given:   Date Additional Medicare IM given:    Discharge Disposition:    Per UR Regulation:  Reviewed for med. necessity/level of care/duration of stay  If discussed at Long Length of Stay Meetings, dates discussed:    Comments:

## 2012-07-20 NOTE — Progress Notes (Signed)
Subjective: No complaints, sitting up in chair. Continues with pause up to 3.8 sec.  Some smaller. On IV NTG, rec'd IV fluids overnight.  Objective: Vital signs in last 24 hours: Temp:  [97.9 F (36.6 C)-98.3 F (36.8 C)] 98.1 F (36.7 C) (06/04 0741) Pulse Rate:  [37-59] 51 (06/04 0741) Resp:  [11-20] 15 (06/04 0741) BP: (109-136)/(46-58) 135/58 mmHg (06/04 0741) SpO2:  [93 %-100 %] 96 % (06/04 0741) Weight:  [196 lb 10.4 oz (89.2 kg)] 196 lb 10.4 oz (89.2 kg) (06/03 1326) Weight change:    Intake/Output from previous day: 06/03 0701 - 06/04 0700 In: 2489 [P.O.:650; I.V.:1839] Out: 1000 [Urine:1000] Intake/Output this shift: Total I/O In: 316.5 [P.O.:240; I.V.:76.5] Out: -   PE: General:Pleasant affect, NAD Skin:Warm and dry, brisk capillary refill Neck:supple, no JVD sitting up in chair  Heart:S1S2 RRR with2/6 systolic murmur, no gallup, rub or click Lungs:clear without rales, rhonchi, or wheezes [name]:WRUE, non tender, + BS, do not palpate liver spleen or masses Ext:no lower ext edema,  2+ radial pulses Neuro:alert and oriented, MAE, follows commands   Lab Results:  Recent Labs  07/19/12 0855  WBC 5.1  HGB 12.2*  HCT 34.7*  PLT 173   BMET  Recent Labs  07/19/12 0855 07/20/12 0910  NA 140 137  K 3.4* 3.7  CL 105 109  CO2 25 22  GLUCOSE 132* 134*  BUN 24* 15  CREATININE 1.54* 1.29  CALCIUM 9.3 8.9    Recent Labs  07/19/12 2034 07/20/12 0156  TROPONINI <0.30 <0.30    Lab Results  Component Value Date   CHOL 137 09/15/2008   HDL 22* 09/15/2008   LDLCALC See Comment mg/dL 4/54/0981   TRIG 191* 4/78/2956   CHOLHDL 6.2 Ratio 09/15/2008   Lab Results  Component Value Date   HGBA1C 5.6 07/19/2012     Lab Results  Component Value Date   TSH 1.333 07/19/2012    Studies/Results: Dg Chest Port 1 View  07/19/2012   *RADIOLOGY REPORT*  Clinical Data: Chest pain.  PORTABLE CHEST - 1 VIEW  Comparison: 10/07/2009  Findings: The heart size and pulmonary  vascularity are normal and the lungs are clear.  No acute osseous abnormality.  IMPRESSION: No acute disease.   Original Report Authenticated By: Francene Boyers, M.D.    Medications: I have reviewed the patient's current medications. Scheduled Meds: . amLODipine  10 mg Oral Daily  . aspirin EC  81 mg Oral Daily  . atorvastatin  20 mg Oral q1800  . enoxaparin (LOVENOX) injection  40 mg Subcutaneous Q24H  . niacin  500 mg Oral Daily  . pantoprazole  40 mg Oral Q breakfast  . tamsulosin  0.4 mg Oral Daily  . vitamin B-12  1,000 mcg Oral Daily   Continuous Infusions: . sodium chloride 150 mL/hr at 07/19/12 1400  . sodium chloride 75 mL/hr at 07/19/12 1800  . nitroGLYCERIN 10 mcg/min (07/19/12 1328)  . nitroGLYCERIN 5 mcg/min (07/20/12 0800)   PRN Meds:.acetaminophen, HYDROcodone-acetaminophen, nitroGLYCERIN, nitroGLYCERIN, ondansetron (ZOFRAN) IV, zolpidem  Assessment/Plan: Principal Problem:   Syncope Active Problems:   HYPERLIPIDEMIA   HYPERTENSION   First degree AV block, PR interval 370 ms   Chest pain, atypical   Acute renal insufficiency   Hypokalemia   Bradycardia  PLAN:  Still with pauses/asystole, longest 3.8 sec.  No symptoms currently.  Awaiting echo.  No chest pain.  Will d/c NTG.  Will keep another 24 hours to allow bystolic to leave system.  If  still with pauses may need PPM.  Discussed with pt and wife.  Will transfer to tele.   LOS: 1 day   Time spent with pt. :30 minutes. [facility] R  Nurse Practitioner Certified Pager [phone] 07/20/2012, 12:42 PM   I have seen and evaluated the patient this PM along with Nada Boozer, NP. I agree with her findings, examination as well as impression recommendations.  Presented with syncope & still having Bradycardia with "blocked P" waves (looks like Mobitz I) -- but with ~3 secs without QRS, concerning as etiology for syncope.  Will need to monitor off of BB -- need to see how the BP goes as well.   I agree with the  insightful note by Mr. Leron Croak yesterday -- need to see return of stable rhythm prior to d/c & will likely need Monitor on d/c.  No mention of CP -- d/c NTG.  Tansfer to tele.   MD Time with pt: 15 min  HARDING,DAVID W, M.D., M.S. [facility] [address] [place], [place]  [address]  [phone] Pager # [phone] 07/20/2012 2:48 PM

## 2012-07-20 NOTE — Progress Notes (Signed)
Pt had a pause of 5.84sec at rest and her HR dropped to 52. RN went into the room and noted pt resting in bed with eyes opened. No indications of distress noted. EKG obtained and showed SB with first degree heart block. HR via EKG 48. MD on-call notified and gave verbal order to "monitor pt" no other orders given. Will continue to monitor.

## 2012-07-21 ENCOUNTER — Encounter (HOSPITAL_COMMUNITY)
Admission: EM | Disposition: A | Discharge status: Home / Self Care | Payer: Self-pay | Source: Home / Self Care | Attending: Internal Medicine

## 2012-07-21 DIAGNOSIS — I2510 Atherosclerotic heart disease of native coronary artery without angina pectoris: Secondary | ICD-10-CM

## 2012-07-21 HISTORY — PX: LEFT HEART CATHETERIZATION WITH CORONARY ANGIOGRAM: SHX5451

## 2012-07-21 LAB — CBC
HCT: 33 % — ABNORMAL LOW (ref 39.0–52.0)
Hemoglobin: 11.4 g/dL — ABNORMAL LOW (ref 13.0–17.0)
MCH: 31.8 pg (ref 26.0–34.0)
MCHC: 34.5 g/dL (ref 30.0–36.0)
MCV: 92.2 fL (ref 78.0–100.0)

## 2012-07-21 LAB — BASIC METABOLIC PANEL
BUN: 15 mg/dL (ref 6–23)
CO2: 24 mEq/L (ref 19–32)
Chloride: 107 mEq/L (ref 96–112)
Glucose, Bld: 91 mg/dL (ref 70–99)
Potassium: 3.5 mEq/L (ref 3.5–5.1)

## 2012-07-21 LAB — PROTIME-INR: INR: 1.14 (ref 0.00–1.49)

## 2012-07-21 SURGERY — LEFT HEART CATHETERIZATION WITH CORONARY ANGIOGRAM
Anesthesia: LOCAL

## 2012-07-21 MED ORDER — SODIUM CHLORIDE 0.9 % IV SOLN: INTRAVENOUS | Status: DC

## 2012-07-21 MED ORDER — SODIUM CHLORIDE 0.9 % IV SOLN: 250.0000 mL | INTRAVENOUS | Status: DC | PRN

## 2012-07-21 MED ORDER — FENTANYL CITRATE 0.05 MG/ML IJ SOLN
INTRAMUSCULAR | Status: AC
  Filled 2012-07-21: qty 2

## 2012-07-21 MED ORDER — SODIUM CHLORIDE 0.9 % IJ SOLN: 3.0000 mL | Freq: Two times a day (BID) | INTRAMUSCULAR | Status: DC

## 2012-07-21 MED ORDER — HEPARIN (PORCINE) IN NACL 2-0.9 UNIT/ML-% IJ SOLN
INTRAMUSCULAR | Status: AC
  Filled 2012-07-21: qty 1000

## 2012-07-21 MED ORDER — SODIUM CHLORIDE 0.9 % IR SOLN
80.0000 mg | Status: DC
  Filled 2012-07-21: qty 2

## 2012-07-21 MED ORDER — ONDANSETRON HCL 4 MG/2ML IJ SOLN: 4.0000 mg | Freq: Four times a day (QID) | INTRAMUSCULAR | Status: DC | PRN

## 2012-07-21 MED ORDER — LIDOCAINE HCL (PF) 1 % IJ SOLN
INTRAMUSCULAR | Status: AC
  Filled 2012-07-21: qty 30

## 2012-07-21 MED ORDER — ACETAMINOPHEN 325 MG PO TABS: 650.0000 mg | ORAL_TABLET | ORAL | Status: DC | PRN

## 2012-07-21 MED ORDER — SODIUM CHLORIDE 0.9 % IV SOLN
INTRAVENOUS | Status: DC
  Administered 2012-07-21: 15:00:00 via INTRAVENOUS

## 2012-07-21 MED ORDER — MIDAZOLAM HCL 2 MG/2ML IJ SOLN
INTRAMUSCULAR | Status: AC
  Filled 2012-07-21: qty 2

## 2012-07-21 MED ORDER — DOPAMINE-DEXTROSE 3.2-5 MG/ML-% IV SOLN
5.0000 ug/kg/min | INTRAVENOUS | Status: DC
  Administered 2012-07-21: 5 ug/kg/min via INTRAVENOUS
  Filled 2012-07-21: qty 250

## 2012-07-21 MED ORDER — ASPIRIN 81 MG PO CHEW
324.0000 mg | CHEWABLE_TABLET | ORAL | Status: AC
  Administered 2012-07-21: 324 mg via ORAL
  Filled 2012-07-21: qty 4

## 2012-07-21 MED ORDER — SODIUM CHLORIDE 0.9 % IJ SOLN: 3.0000 mL | INTRAMUSCULAR | Status: DC | PRN

## 2012-07-21 MED ORDER — CEFAZOLIN SODIUM-DEXTROSE 2-3 GM-% IV SOLR
2.0000 g | INTRAVENOUS | Status: DC
  Filled 2012-07-21: qty 50

## 2012-07-21 NOTE — Progress Notes (Signed)
Transferred -in from 2000 by bed awake and alert.

## 2012-07-21 NOTE — Progress Notes (Signed)
Pt transferred to 2900 via bed and monitor, O2 , ext pacer pads on. Pt alert oriented asymptomatic. Family aware of pt transfer Derrick Long

## 2012-07-21 NOTE — Progress Notes (Signed)
HR- 29 then goes back to 40's, asymptomatic, PA made aware. Continue to monitor.

## 2012-07-21 NOTE — Progress Notes (Signed)
Transported to the cath lab by bed, no complaints presented.

## 2012-07-21 NOTE — Progress Notes (Signed)
Had 4.2 sec pause, claimed"i feel warm. bp-141/42, hr 50-. DR Tresa Endo made aware. Continue to monitor.

## 2012-07-21 NOTE — Progress Notes (Signed)
[facility]  Subjective: Pt felt dizzy and lightheaded earlier this AM. He is now asymptomatic.  Objective: Vital signs in last 24 hours: Temp:  [98 F (36.7 C)-98.7 F (37.1 C)] 98.6 F (37 C) (06/05 0430) Pulse Rate:  [50-54] 50 (06/05 0430) Resp:  [18] 18 (06/05 0430) BP: (136-156)/(48-59) 145/52 mmHg (06/05 0430) SpO2:  [98 %-100 %] 98 % (06/05 0430) Last BM Date: 07/21/12  Intake/Output from previous day: 06/04 0701 - 06/05 0700 In: 1180.5 [P.O.:720; I.V.:460.5] Out: 1501 [Urine:1500; Stool:1] Intake/Output this shift:    Medications Current Facility-Administered Medications  Medication Dose Route Frequency Provider Last Rate Last Dose  . acetaminophen (TYLENOL) tablet 650 mg  650 mg Oral Q4H PRN Wilburt Finlay, PA-C      . amLODipine (NORVASC) tablet 10 mg  10 mg Oral Daily Wilburt Finlay, PA-C      . aspirin EC tablet 81 mg  81 mg Oral Daily Wilburt Finlay, PA-C   81 mg at 07/20/12 1112  . atorvastatin (LIPITOR) tablet 20 mg  20 mg Oral q1800 Wilburt Finlay, PA-C   20 mg at 07/20/12 1648  . enoxaparin (LOVENOX) injection 40 mg  40 mg Subcutaneous Q24H Wilburt Finlay, PA-C   40 mg at 07/20/12 1649  . HYDROcodone-acetaminophen (NORCO/VICODIN) 5-325 MG per tablet 1 tablet  1 tablet Oral BID PRN Wilburt Finlay, PA-C   1 tablet at 07/19/12 1821  . niacin tablet 500 mg  500 mg Oral Daily Wilburt Finlay, PA-C   500 mg at 07/20/12 1000  . nitroGLYCERIN (NITROSTAT) SL tablet 0.4 mg  0.4 mg Sublingual Q5 min PRN Carleene Cooper III, MD   0.4 mg at 07/19/12 0942  . ondansetron (ZOFRAN) injection 4 mg  4 mg Intravenous Q6H PRN Wilburt Finlay, PA-C      . pantoprazole (PROTONIX) EC tablet 40 mg  40 mg Oral Q breakfast Wilburt Finlay, PA-C   40 mg at 07/21/12 7829  . tamsulosin (FLOMAX) capsule 0.4 mg  0.4 mg Oral Daily Wilburt Finlay, PA-C   0.4 mg at 07/20/12 1112  . vitamin B-12 (CYANOCOBALAMIN) tablet 1,000 mcg  1,000 mcg Oral Daily Chrystie Nose, MD   1,000 mcg at 07/20/12 1113   . zolpidem (AMBIEN) tablet 5 mg  5 mg Oral QHS PRN Chrystie Nose, MD   5 mg at 07/19/12 2210    PE: General appearance: alert, cooperative and no distress Neck: no JVD Lungs: clear to auscultation bilaterally Heart: regular rate and rhythm and 2/6 SM, best heard over RUSB Extremities: no LEE Pulses: 2+ radials, 1+ DPs Skin: warm and dry Neurologic: Grossly normal  Lab Results:   Recent Labs  07/19/12 0855 07/21/12 0500  WBC 5.1 5.5  HGB 12.2* 11.4*  HCT 34.7* 33.0*  PLT 173 168   BMET  Recent Labs  07/19/12 0855 07/20/12 0910 07/21/12 0500  NA 140 137 139  K 3.4* 3.7 3.5  CL 105 109 107  CO2 25 22 24   GLUCOSE 132* 134* 91  BUN 24* 15 15  CREATININE 1.54* 1.29 1.34  CALCIUM 9.3 8.9 9.5   PT/INR  Recent Labs  07/21/12 0500  LABPROT 14.4  INR 1.14     Assessment/Plan  Principal Problem:   Syncope Active Problems:   HYPERLIPIDEMIA   HYPERTENSION   First degree AV block, PR interval 370 ms   Chest pain, atypical   Acute renal insufficiency   Hypokalemia   Bradycardia  Plan: Pt admitted for syncope, secondary  to bradycardia. His EKG demonstrated 1st Degree AV block. His Bystolic has been on hold. The patient continued to have frequent pauses overnight. His longest pause was 6.11 seconds around 6:00 am this morning.  His HR dropped into the low 30s. Current HR is in the 40s. BP is stable. He has been symptomatic, complaining of lightheadedness and dizziness. Pacer pads are in place. Will transfer to stepdown and will start on Dopamine at a rate of 80mc/kg/min. Will likely need consideration for a PPM. MD to follow with further recommendation.     LOS: 2 days    Brittainy M. Delmer Islam 07/21/2012 8:02 AM    Patient seen and examined. Agree with assessment and plan. Pt has continued to have pauses up to 6 sec earlier today. Now in CCu with recurrent pauses of . 4 sec. Pt has also experienced chest tightness and did have relief with sl NTG.  Will  plan cardiac cath today to make certain no significant CAD and specifically RCA disease involving blood supply to conduction system, and plan temporary pacemaker.  Will schedule for permanent pacemaker with Dr. Royann Shivers tomorrow. Discussed with patient and multiple family members who are in agreement with plan.   Lennette Bihari, MD, [facility] 07/21/2012 1:29 PM

## 2012-07-21 NOTE — Progress Notes (Signed)
Pt had another long pause of 6.11sec at rest and his HR dropped to 32. He also c/o dizziness when he gets OOB. PA on-call called and notified. PA gave order to place pacer pad. Will continue to monitor.  Azucena Kuba

## 2012-07-21 NOTE — Progress Notes (Signed)
Complained of chest tpressure scale -7/10. bp- 127/58, nitro sl x1 with relief, ekg done, with no changes from previous. Dr. Tresa Endo made aware, seen pt at once, prep done for cath, and temp. pacemaker  Insertion.

## 2012-07-21 NOTE — CV Procedure (Signed)
Derrick Long is a 79 y.o. male    [id number]  [id number] LOCATION:  FACILITY: [facility]  PHYSICIAN: Lennette Bihari, MD, [facility] [birth year]   DATE OF PROCEDURE:  07/21/2012  DATE OF DISCHARGE:  [facility]  CARDIAC CATHETERIZATION    Indications: Derrick Long is an 79 year old African American gentleman who was admitted several days ago following a syncopal episode. He was taken off his Bystolic. A 2-D echo Doppler study showed normal LV contractility with aortic valve sclerosis. This morning he was noted to have a 6.11 second pause and later today had a 4.2 second pause. He was transported to the coronary care unit and was started on dopamine. He also experienced mild chest tightness and he felt there was some improvement following sublingual nitroglycerin. He is now brought to the cardiac catheterization laboratory to assess his coronary anatomy and make sure his sinus pauses are not due to ischemia, and insertion of a temporary pacemaker. Plans are for permanent pacemaker insertion tomorrow.   PROCEDURE DESCRIPTION:   The patient was brought to the second floor  [place] Cardiac cath lab in the postabsorptive state. He  was premedicated with 1 mg of Versed and 25 mcg of fentanyl. His right groin was prepped and shaved in usual sterile fashion. Xylocaine 1% was used  for local anesthesia. A  5 French arterial and 6 French venous  sheath was inserted into the right femoral artery and right femoral vein without difficulty. A 5 French transvenous pacemaker was inserted and advanced to the RV apex with excellent capture.  Diagnostic catheterizatiion was done utilizing [place] LF4, FR4, and pigtail catheters. With the pacemaker set at a rate of 50, with each coronary injection, the patient did ventricular pace.  A pigtail catheter was placed into the left ventricle to assess the LV AO pressure carefully with his aortic sclerosis but left ventriculography was not  performed to preserve contrast in renal function.    HEMODYNAMICS:    AO SYSTOLIC/AO DIASTOLIC: 113/45   LV SYSTOLIC/LV DIASTOLIC: 116/12  ANGIOGRAPHIC RESULTS:   1. Left main: normal wild mild inferior ostial calcification. 2. LAD: 30 and 20% proximal proximal to mid areas of narrowing. 3. Left circumflex: Normal dominant vessel  4. Right coronary artery: Normal nondominant vessel with a smooth small region of ostial calcification.    IMPRESSION:  Mild nonobstructive coronary artery disease with 30 and 20% there is irregularity in the LAD and evidence for mild ostial calcification in the region of the aorta at the origin of the left main and right coronary arteries. Successful insertion of temporary transvenous pacemaker to the RV apex.  Plan for permanent pacemaker insertion tomorrow.  Lennette Bihari, MD, [facility] 07/21/2012 4:44 PM

## 2012-07-22 ENCOUNTER — Encounter (HOSPITAL_COMMUNITY)
Admission: EM | Disposition: A | Discharge status: Home / Self Care | Payer: Self-pay | Source: Home / Self Care | Attending: Internal Medicine

## 2012-07-22 DIAGNOSIS — I495 Sick sinus syndrome: Secondary | ICD-10-CM

## 2012-07-22 HISTORY — PX: PERMANENT PACEMAKER INSERTION: SHX5480

## 2012-07-22 LAB — BASIC METABOLIC PANEL
CO2: 24 mEq/L (ref 19–32)
Glucose, Bld: 105 mg/dL — ABNORMAL HIGH (ref 70–99)
Potassium: 3.4 mEq/L — ABNORMAL LOW (ref 3.5–5.1)
Sodium: 137 mEq/L (ref 135–145)

## 2012-07-22 SURGERY — PERMANENT PACEMAKER INSERTION
Anesthesia: LOCAL

## 2012-07-22 MED ORDER — CHLORHEXIDINE GLUCONATE 4 % EX LIQD
Freq: Once | CUTANEOUS | Status: AC
  Administered 2012-07-22: 12:00:00 via TOPICAL
  Filled 2012-07-22: qty 60

## 2012-07-22 MED ORDER — ONDANSETRON HCL 4 MG/2ML IJ SOLN
INTRAMUSCULAR | Status: AC
  Filled 2012-07-22: qty 2

## 2012-07-22 MED ORDER — CEFAZOLIN SODIUM 1-5 GM-% IV SOLN
1.0000 g | Freq: Four times a day (QID) | INTRAVENOUS | Status: DC
  Administered 2012-07-22 – 2012-07-23 (×2): 1 g via INTRAVENOUS
  Filled 2012-07-22 (×4): qty 50

## 2012-07-22 MED ORDER — SODIUM CHLORIDE 0.9 % IV SOLN
INTRAVENOUS | Status: DC
  Administered 2012-07-22: 22:00:00 via INTRAVENOUS

## 2012-07-22 MED ORDER — MIDAZOLAM HCL 5 MG/5ML IJ SOLN
INTRAMUSCULAR | Status: AC
  Filled 2012-07-22: qty 5

## 2012-07-22 MED ORDER — FENTANYL CITRATE 0.05 MG/ML IJ SOLN
INTRAMUSCULAR | Status: AC
  Filled 2012-07-22: qty 2

## 2012-07-22 NOTE — Progress Notes (Signed)
[facility]  Subjective: Denies chest pain. No groin pain, back or flank pain. No lightheadedness or dizziness.  Objective: Vital signs in last 24 hours: Temp:  [97.9 F (36.6 C)-98.6 F (37 C)] 98 F (36.7 C) (06/06 0724) Pulse Rate:  [42-77] 50 (06/06 0700) Resp:  [10-27] 18 (06/06 0700) BP: (105-162)/(21-98) 144/45 mmHg (06/06 0700) SpO2:  [71 %-100 %] 100 % (06/06 0700) Weight:  [194 lb 0.1 oz (88 kg)] 194 lb 0.1 oz (88 kg) (06/06 0500) Last BM Date: 07/21/12  Intake/Output from previous day: 06/05 0701 - 06/06 0700 In: 1810.3 [P.O.:600; I.V.:1210.3] Out: 1440 [Urine:1440] Intake/Output this shift:    Medications Current Facility-Administered Medications  Medication Dose Route Frequency Provider Last Rate Last Dose  . 0.9 %  sodium chloride infusion   Intravenous Continuous Lennette Bihari, MD 125 mL/hr at 07/21/12 1745    . 0.9 %  sodium chloride infusion   Intravenous Continuous Brittainy Simmons, PA-C 50 mL/hr at 07/22/12 0600    . 0.9 %  sodium chloride infusion   Intravenous Continuous Brittainy Simmons, PA-C 20 mL/hr at 07/21/12 1745    . acetaminophen (TYLENOL) tablet 650 mg  650 mg Oral Q4H PRN Wilburt Finlay, PA-C      . acetaminophen (TYLENOL) tablet 650 mg  650 mg Oral Q4H PRN Lennette Bihari, MD      . amLODipine (NORVASC) tablet 10 mg  10 mg Oral Daily Wilburt Finlay, PA-C   10 mg at 07/21/12 4098  . aspirin EC tablet 81 mg  81 mg Oral Daily Wilburt Finlay, PA-C   81 mg at 07/21/12 1191  . atorvastatin (LIPITOR) tablet 20 mg  20 mg Oral q1800 Wilburt Finlay, PA-C   20 mg at 07/21/12 1850  . ceFAZolin (ANCEF) IVPB 2 g/50 mL premix  2 g Intravenous On Call [employer], PA-C      . DOPamine (INTROPIN) 800 mg in dextrose 5 % 250 mL infusion  5 mcg/kg/min Intravenous Titrated Brittainy Simmons, PA-C 8.4 mL/hr at 07/21/12 2000 5 mcg/kg/min at 07/21/12 2000  . enoxaparin (LOVENOX) injection 40 mg  40 mg Subcutaneous Q24H Wilburt Finlay, PA-C   40  mg at 07/21/12 1849  . gentamicin (GARAMYCIN) 80 mg in sodium chloride irrigation 0.9 % 500 mL irrigation  80 mg Irrigation On Call [employer], PA-C      . HYDROcodone-acetaminophen (NORCO/VICODIN) 5-325 MG per tablet 1 tablet  1 tablet Oral BID PRN Wilburt Finlay, PA-C   1 tablet at 07/19/12 1821  . niacin tablet 500 mg  500 mg Oral Daily Wilburt Finlay, PA-C   500 mg at 07/21/12 4782  . ondansetron (ZOFRAN) injection 4 mg  4 mg Intravenous Q6H PRN Wilburt Finlay, PA-C   4 mg at 07/21/12 1107  . ondansetron (ZOFRAN) injection 4 mg  4 mg Intravenous Q6H PRN Lennette Bihari, MD      . pantoprazole (PROTONIX) EC tablet 40 mg  40 mg Oral Q breakfast Wilburt Finlay, PA-C   40 mg at 07/21/12 9562  . tamsulosin (FLOMAX) capsule 0.4 mg  0.4 mg Oral Daily Wilburt Finlay, PA-C   0.4 mg at 07/21/12 1308  . vitamin B-12 (CYANOCOBALAMIN) tablet 1,000 mcg  1,000 mcg Oral Daily Chrystie Nose, MD   1,000 mcg at 07/21/12 6578  . zolpidem (AMBIEN) tablet 5 mg  5 mg Oral QHS PRN Chrystie Nose, MD   5 mg at 07/19/12 2210    PE: General appearance: alert,  cooperative and no distress Lungs: clear to auscultation bilaterally Heart: regular rate and rhythm and 2/6 SM best heard at RUSB Extremities: no LEE Pulses: 2+ and symmetric Skin: warm and dry Neurologic: Grossly normal  Lab Results:   Recent Labs  07/19/12 0855 07/21/12 0500  WBC 5.1 5.5  HGB 12.2* 11.4*  HCT 34.7* 33.0*  PLT 173 168   BMET  Recent Labs  07/20/12 0910 07/21/12 0500 07/22/12 0400  NA 137 139 137  K 3.7 3.5 3.4*  CL 109 107 106  CO2 22 24 24   GLUCOSE 134* 91 105*  BUN 15 15 13   CREATININE 1.29 1.34 1.21  CALCIUM 8.9 9.5 8.7   PT/INR  Recent Labs  07/21/12 0500  LABPROT 14.4  INR 1.14    Studies/Results:  LHC 07/21/12 HEMODYNAMICS:  AO SYSTOLIC/AO DIASTOLIC: 113/45  LV SYSTOLIC/LV DIASTOLIC: 116/12  ANGIOGRAPHIC RESULTS:  1. Left main: normal wild mild inferior ostial calcification.  2. LAD: 30 and 20%  proximal proximal to mid areas of narrowing.  3. Left circumflex: Normal dominant vessel  4. Right coronary artery: Normal nondominant vessel with a smooth small region of ostial calcification.   Assessment/Plan  Principal Problem:   Syncope Active Problems:   HYPERLIPIDEMIA   HYPERTENSION   First degree AV block, PR interval 370 ms   Chest pain, atypical   Acute renal insufficiency   Hypokalemia   Bradycardia  Plan: S/P diagnostic LHC yesterday, as well as insertion of a temporary transvenous pacemaker to the RV apex. The cath revealed mild nonobstructive CAD. There was mild irregularity in the LAD with 30 and 20% proximal to mid areas of narrowing. No further chest pain. No groin, back or flank pain. Plan for PPM insertion today with Dr. Royann Shivers for symptomatic bradycardia.    LOS: 3 days    Brittainy M. Sharol Harness, PA-C 07/22/2012 8:18 AM  I have seen and examined the patient along with Brittainy M. Sharol Harness, PA-C.  I have reviewed the chart, notes and new data.  I agree with PA's note.  Key new complaints: comfortable lying flat in bed Key examination changes: competing paced rhythm and sinus bradycardia Key new findings / data: cath reviewed, K still a little low.  PLAN: Dual chamber permanent pacemaker for symptomatic bradycardia due to sinus node dysfunction (syncope with sinus arrest), also with evidence of AV node conduction disease. This procedure has been fully reviewed with the patient and written informed consent has been obtained.   Thurmon Fair, MD, [facility] [phone] 07/22/2012, 8:43 AM

## 2012-07-22 NOTE — Op Note (Signed)
Procedure report  Procedure performed:  1. Implantation of new dual chamber permanent pacemaker 2. Fluoroscopy . Light sedation  Reason for procedure:  Symptomatic bradycardia due to: Sinus arrest  Procedure performed by: Thurmon Fair, MD  Complications: None  Estimated blood loss: <10 mL  Medications administered during procedure: Ancef 2 g intravenously Lidocaine 1% 30 mL locally,  Fentanyl 50 mcg intravenously Versed 3 mg intravenously  Device details: Generator and Judeent DR RF  model 2210 serial number [id number] Right atrial lead [employer] tendril STS 2088 TC-52 serial number [id number] Right ventricular lead [employer] tendril STS 2088-58 serial number [id number]  Procedure details:  After the risks and benefits of the procedure were discussed the patient provided informed consent and was brought to the cardiac cath lab in the fasting state. The patient was prepped and draped in usual sterile fashion. Local anesthesia with 1% lidocaine was administered to to the left infraclavicular area. A 5-6 cm horizontal incision was made parallel with and 2-3 cm caudal to the left clavicle. Using electrocautery and blunt dissection a prepectoral pocket was created down to the level of the pectoralis major muscle fascia. The pocket was carefully inspected for hemostasis. An antibiotic-soaked sponge was placed in the pocket.  Under fluoroscopic guidance and using the modified Seldinger technique 2 separate venipunctures were performed to access the left subclavian vein. No difficulty was encountered accessing the vein.  Two J-tip guidewires were subsequently exchanged for two 7 French safe sheaths.  Under fluoroscopic guidance the ventricular lead was advanced to level of the mid to apical right ventricular septum and thet active-fixation helix was deployed. Prominent current of injury was seen. Satisfactory pacing and sensing parameters were recorded. There was no evidence of  diaphragmatic stimulation at maximum device output. The safe sheath was peeled away and the lead was secured in place with 2-0 silk.  In similar fashion the right atrial lead was advanced to the level of the atrial appendage. The active-fixation helix was deployed. There was prominent current of injury. Satisfactory  pacing and sensing parameters were recorded. There was no evidence of diaphragmatic stimulation with pacing at maximum device output. The safe sheath was peeled away and the lead was secured in place with 2-0 silk.  The antibiotic-soaked sponge was removed from the pocket. The pocket was flushed with copious amounts of antibiotic solution. Reinspection showed excellent hemostasis..  The ventricular lead was connected to the generator and appropriate ventricular pacing was seen. Subsequently the atrial lead was also connected. Repeat testing of the lead parameters later showed excellent values.  The entire system was then carefully inserted in the pocket with care been taking that the leads and device assumed a comfortable position without pressure on the incision. Great care was taken that the leads be located deep to the generator. The pocket was then closed in layers using 2 layers of 2-0 Vicryl and cutaneous staples, after which a sterile dressing was applied.  At the end of the procedure the following lead parameters were encountered:  Right atrial lead  sensed P waves 3.4 mV, impedance 494 ohms, threshold 1.5 V at 0.5 ms pulse width.  Right ventricular lead sensed R waves 10.1 mV, impedance 706 ohms, threshold 0.8 V at 0.5 ms pulse width.   Cc:

## 2012-07-23 ENCOUNTER — Inpatient Hospital Stay (HOSPITAL_COMMUNITY): Payer: Medicare Other

## 2012-07-23 LAB — BASIC METABOLIC PANEL
BUN: 14 mg/dL (ref 6–23)
Chloride: 105 mEq/L (ref 96–112)
Glucose, Bld: 86 mg/dL (ref 70–99)
Potassium: 3.8 mEq/L (ref 3.5–5.1)

## 2012-07-23 MED ORDER — ATORVASTATIN CALCIUM 20 MG PO TABS: 20.0000 mg | ORAL_TABLET | Freq: Every day | ORAL | Status: DC

## 2012-07-23 MED ORDER — CYANOCOBALAMIN 1000 MCG PO TABS: 1000.0000 ug | ORAL_TABLET | Freq: Every day | ORAL | Status: DC

## 2012-07-23 MED ORDER — ASPIRIN 81 MG PO TBEC: 81.0000 mg | DELAYED_RELEASE_TABLET | Freq: Every day | ORAL | Status: DC

## 2012-07-23 NOTE — Progress Notes (Signed)
Orthopedic Tech Progress Note Patient Details:  [name] [birth year] [id number] Patient already has arm sling. No action needed by Ortho Tech.  Patient ID: Derrick Long, male   DOB: Apr 13, 1926, 79 y.o.   MRN: [id number]   Orie Rout 07/23/2012, 8:44 AM

## 2012-07-23 NOTE — Progress Notes (Signed)
[facility]  DAILY PROGRESS NOTE   Subjective:  Feels well. No pain or bleeding at surgical site.  Objective:  Temp:  [97.9 F (36.6 C)-99.2 F (37.3 C)] 98.5 F (36.9 C) (06/07 0630) Pulse Rate:  [50-74] 65 (06/07 0630) Resp:  [0-18] 18 (06/06 1951) BP: (108-152)/(46-78) 145/61 mmHg (06/07 0630) SpO2:  [95 %-100 %] 95 % (06/07 0630) Weight:  [87.998 kg (194 lb)] 87.998 kg (194 lb) (06/06 1817) Weight change: -0.002 kg (-0.1 oz)  Intake/Output from previous day: 06/06 0701 - 06/07 0700 In: 872.3 [I.V.:822.3; IV Piggyback:50] Out: 675 [Urine:675]  Intake/Output from this shift: Total I/O In: 360 [P.O.:360] Out: 125 [Urine:125]  Medications: Current Facility-Administered Medications  Medication Dose Route Frequency Provider Last Rate Last Dose  . 0.9 %  sodium chloride infusion   Intravenous Continuous Lennette Bihari, MD      . 0.9 %  sodium chloride infusion   Intravenous Continuous Brittainy Simmons, PA-C 20 mL/hr at 07/22/12 0700    . 0.9 %  sodium chloride infusion   Intravenous Continuous Bresha Hosack, MD      . acetaminophen (TYLENOL) tablet 650 mg  650 mg Oral Q4H PRN Wilburt Finlay, PA-C      . acetaminophen (TYLENOL) tablet 650 mg  650 mg Oral Q4H PRN Lennette Bihari, MD      . amLODipine (NORVASC) tablet 10 mg  10 mg Oral Daily Wilburt Finlay, PA-C   10 mg at 07/22/12 1037  . aspirin EC tablet 81 mg  81 mg Oral Daily Wilburt Finlay, PA-C   81 mg at 07/22/12 1038  . atorvastatin (LIPITOR) tablet 20 mg  20 mg Oral q1800 Wilburt Finlay, PA-C   20 mg at 07/21/12 1850  . ceFAZolin (ANCEF) IVPB 1 g/50 mL premix  1 g Intravenous Q6H Jaquila Santelli, MD   1 g at 07/23/12 0354  . DOPamine (INTROPIN) 800 mg in dextrose 5 % 250 mL infusion  5 mcg/kg/min Intravenous Titrated Brittainy Simmons, PA-C 8.4 mL/hr at 07/22/12 0700 5 mcg/kg/min at 07/22/12 0700  . enoxaparin (LOVENOX) injection 40 mg  40 mg Subcutaneous Q24H Wilburt Finlay, PA-C   40 mg at 07/21/12 1849   . HYDROcodone-acetaminophen (NORCO/VICODIN) 5-325 MG per tablet 1 tablet  1 tablet Oral BID PRN Wilburt Finlay, PA-C   1 tablet at 07/19/12 1821  . niacin tablet 500 mg  500 mg Oral Daily Wilburt Finlay, PA-C   500 mg at 07/22/12 1038  . ondansetron (ZOFRAN) injection 4 mg  4 mg Intravenous Q6H PRN Wilburt Finlay, PA-C   4 mg at 07/21/12 1107  . ondansetron (ZOFRAN) injection 4 mg  4 mg Intravenous Q6H PRN Lennette Bihari, MD      . pantoprazole (PROTONIX) EC tablet 40 mg  40 mg Oral Q breakfast Wilburt Finlay, PA-C   40 mg at 07/22/12 1037  . tamsulosin (FLOMAX) capsule 0.4 mg  0.4 mg Oral Daily Wilburt Finlay, PA-C   0.4 mg at 07/22/12 1037  . vitamin B-12 (CYANOCOBALAMIN) tablet 1,000 mcg  1,000 mcg Oral Daily Chrystie Nose, MD   1,000 mcg at 07/22/12 1038  . zolpidem (AMBIEN) tablet 5 mg  5 mg Oral QHS PRN Chrystie Nose, MD   5 mg at 07/19/12 2210    Physical Exam: General appearance: alert, cooperative and no distress Neck: no adenopathy, no carotid bruit, no JVD, supple, symmetrical, trachea midline and thyroid not enlarged, symmetric, no tenderness/mass/nodules Lungs: clear to auscultation bilaterally  Heart: regular rate and rhythm, S1, S2 normal, no murmur, click, rub or gallop Abdomen: soft, non-tender; bowel sounds normal; no masses,  no organomegaly Extremities: extremities normal, atraumatic, no cyanosis or edema Pulses: 2+ and symmetric Skin: Skin color, texture, turgor normal. No rashes or lesions Neurologic: Grossly normal  Lab Results: Results for orders placed during the hospital encounter of 07/19/12 (from the past 48 hour(s))  BASIC METABOLIC PANEL     Status: Abnormal   Collection Time    07/22/12  4:00 AM      Result Value Range   Sodium 137  135 - 145 mEq/L   Potassium 3.4 (*) 3.5 - 5.1 mEq/L   Chloride 106  96 - 112 mEq/L   CO2 24  19 - 32 mEq/L   Glucose, Bld 105 (*) 70 - 99 mg/dL   BUN 13  6 - 23 mg/dL   Creatinine, Ser 1.61  0.50 - 1.35 mg/dL   Calcium 8.7  8.4 -  09.6 mg/dL   GFR calc non Af Amer 53 (*) >90 mL/min   GFR calc Af Amer 62 (*) >90 mL/min   Comment:            The eGFR has been calculated     using the CKD EPI equation.     This calculation has not been     validated in all clinical     situations.     eGFR's persistently     <90 mL/min signify     possible Chronic Kidney Disease.  BASIC METABOLIC PANEL     Status: Abnormal   Collection Time    07/23/12  5:47 AM      Result Value Range   Sodium 138  135 - 145 mEq/L   Potassium 3.8  3.5 - 5.1 mEq/L   Chloride 105  96 - 112 mEq/L   CO2 24  19 - 32 mEq/L   Glucose, Bld 86  70 - 99 mg/dL   BUN 14  6 - 23 mg/dL   Creatinine, Ser 0.45  0.50 - 1.35 mg/dL   Calcium 9.0  8.4 - 40.9 mg/dL   GFR calc non Af Amer 52 (*) >90 mL/min   GFR calc Af Amer 60 (*) >90 mL/min   Comment:            The eGFR has been calculated     using the CKD EPI equation.     This calculation has not been     validated in all clinical     situations.     eGFR's persistently     <90 mL/min signify     possible Chronic Kidney Disease.    Imaging: Dg Chest 2 View  07/23/2012   *RADIOLOGY REPORT*  Clinical Data: Pacemaker placement  CHEST - 2 VIEW  Comparison: 07/19/2012  Findings: Plate-like atelectasis in the lingula.  The lungs are otherwise essentially clear. No pleural effusion or pneumothorax.  The heart is normal in size.  Interval placement of a left subclavian dual lead pacemaker in satisfactory position.  Mild degenerative changes of the visualized thoracolumbar spine.  Cholecystectomy clips.  IMPRESSION: Interval placement of a left subclavian dual lead pacemaker in satisfactory position.  No pneumothorax.   Original Report Authenticated By: Charline Bills, M.D.    Assessment:  1. Principal Problem: 2.   Syncope 3. Active Problems: 4.   HYPERLIPIDEMIA 5.   HYPERTENSION 6.   First degree AV block, PR interval 370 ms 7.  Chest pain, atypical 8.   Acute renal insufficiency 9.    Hypokalemia 10.   Bradycardia 11.   DEVICE CHECK RA lead P waves 2.8, imp. 440, threshold 0.75@0 .5; RV lead Rwaves 8.8, imp. 600, threshold 0.75@0 .5  Plan:  1. DC home 2. Wound check 7-10 days 3. Device check 1 month 4. acirvity and wound care instructions discussed in detail.  Time Spent Directly with Patient:  40 minutes  Length of Stay:  LOS: 4 days    Derrick Long 07/23/2012, 10:08 AM

## 2012-07-23 NOTE — Discharge Summary (Signed)
Physician Discharge Summary  Patient ID: Derrick Long MRN: [id number] DOB/AGE: April 01, 1926 79 y.o.  Admit date: 07/19/2012 Discharge date: 07/23/2012  Admission Diagnoses: Syncope, secondary to bradycardia  Discharge Diagnoses:  Principal Problem:   Syncope Active Problems:   Bradycardia   First degree AV block, PR interval 370 ms   HYPERLIPIDEMIA   HYPERTENSION   Chest pain, atypical   Acute renal insufficiency   Hypokalemia   Discharged Condition: stable  Hospital Course: The patient is an 79 year old African American male with a history of hypertension, acid reflux, long first degree AV block, bradycardia, remote tobacco abuse (quit 25 yrs ago) and prostate disease. He has no prior history of coronary disease or MI. He presented to [facility] on 07/19/12 after sustaining a syncopal episode at his home. He was transported to the ED via EMS. On arrival, he was noted to be bradycardic with a heart rate in the 50s. He had endorsed prior dizziness, but denied recent fatigue, weakness, palpitations, shortness of breath, melena, hematochezia, and hematuria. In the ED, he had endorsed chest pain that was worse with inspiration and palpation of his chest. At the time, it was felt that his pain was musculoskeletal in nature.  His EKG showed nonspecific T wave changes and was essentially unchanged from prior EKG in 2011. His troponins were negative. He was admitted to telemetry for overnight observation. His beta blocker was held. Overnight, he was noted to have frequent pauses, the longest being 6.11 seconds and he continued to endorse dizziness. His HR had dropped overnight into the low 40s- upper 30s. He was transferred to the CCU and started on Dopamine. The patient later endorsed a change in his chest pain to a sensation of tightness that was relieved with sublingual nitroglycerine. It was then decieded to have the patient undergo a diagnostic left heart cathertization and well as insertion of a temporary  pacemaker. The procedure was pereformed by Dr. Tresa Endo. The cath revealed nonobstructive CAD with only 30 and 20% proximal to mid areas of narrowing of the LAD.  A temporary transvenous pacemaker was successfully placed. The patient left the cath lab in stable condition and had no complications. However, it was decided that the patient would benefit from insertion of a permanent pacemaker for symptomatic bradycardia. The procedure was performed by Dr. Ernest Pine. The patient underwent successful insertion of a [employer] PPM. He left the OR in stable condition. He was kept overnight for additional observation. A post-operative CXR demonstrated proper lead placement and showed no evidence of a pneumothorax. Device interrogation revealed normal functioning. His incision site was stable. He was last seen and examined by Dr. Royann Shivers, who determined he was stable for discharge home. He was provided detailed instructions on proper wound care and restrictions/activity limitations. He will be seen at [facility], by Nada Boozer, NP on 08/03/12 for wound check and staple removal.  He will need a device interrogation 1 month post-procedure.    Consults: None  Significant Diagnostic Studies:   LHC 07/21/12 HEMODYNAMICS:  AO SYSTOLIC/AO DIASTOLIC: 113/45  LV SYSTOLIC/LV DIASTOLIC: 116/12  ANGIOGRAPHIC RESULTS:  1. Left main: normal wild mild inferior ostial calcification.  2. LAD: 30 and 20% proximal proximal to mid areas of narrowing.  3. Left circumflex: Normal dominant vessel  4. Right coronary artery: Normal nondominant vessel with a smooth small region of ostial calcification.    Treatments: See Hospital Course  Discharge Exam: Blood pressure 130/60, pulse 65, temperature 98.5 F (36.9 C), temperature source Oral, resp. rate  18, height 6\' 1"  (1.854 m), weight 194 lb (87.998 kg), SpO2 95.00%.   Disposition: 01-Home or Self Care      Discharge Orders   Future Appointments Provider Department Dept Phone    08/03/2012 10:00 AM Nada Boozer, NP [facility] [place] [phone]   Future Orders Complete By Expires     Diet - low sodium heart healthy  As directed     Discharge instructions  As directed     Comments:      Supplemental Discharge Instructions for  Pacemaker/Defibrillator Patients  Activity Do not raise your left/right arm above shoulder level or extend it backward beyond shoulder level for 2 weeks. Wear the arm sling as a reminder or as needed for comfort for 2 weeks. No heavy lifting or vigorous activity with your left/right arm for 6-8 weeks.    NO DRIVING is preferable for 2 weeks; If absolutely necessary, drive only short, familiar routes. DO wear your seatbelt, even if it crosses over the pacemaker site.  WOUND CARE Keep the wound area clean and dry.  Remove the dressing the day after you return home (usually 48 hours after the procedure). DO NOT SUBMERGE UNDER WATER UNTIL FULLY HEALED (no tub baths, hot tubs, swimming pools, etc.).  You  may shower or take a sponge bath after the dressing is removed. DO NOT SOAK the area and do not allow the shower to directly spray on the site. If you have staples, these will be removed in the office in 7-14 days. If you have tape/steri-strips on your wound, these will fall off; do not pull them off prematurely.   No bandage is needed on the site.  DO  NOT apply any creams, oils, or ointments to the wound area. If you notice any drainage or discharge from the wound, any swelling, excessive redness or bruising at the site, or if you develop a fever > 101? F after you are discharged home, call the office at once.  Special Instructions You are still able to use cellular telephones.  Avoid carrying your cellular phone near your device. When traveling through airports, show security personnel your identification card to avoid being screened in the metal detectors.  Avoid arc welding equipment, MRI testing (magnetic  resonance imaging), TENS units (transcutaneous nerve stimulators).  Call the office for questions about other devices. Avoid electrical appliances that are in poor condition or are not properly grounded. Microwave ovens are safe to be near or to operate.  Additional information for defibrillator patients should your device go off: If your device goes off ONCE and you feel fine afterward, notify the clinic at [phone]. If your device goes off ONCE and you do not feel well afterward, call 911. If your device goes off TWICE or more in one day, call 911.  DO NOT DRIVE YOURSELF OR A FAMILY MEMBER WITH A DEFIBRILLATOR TO THE HOSPITAL-CALL 911.    Increase activity slowly  As directed         Medication List    STOP taking these medications       losartan 100 MG tablet  Commonly known as:  COZAAR      TAKE these medications       AMLODIPINE BESYLATE PO  Take 10 mg by mouth daily.     aspirin 81 MG EC tablet  Take 1 tablet (81 mg total) by mouth daily.     atorvastatin 20 MG tablet  Commonly known as:  LIPITOR  Take  1 tablet (20 mg total) by mouth daily at 6 PM.     B-12 1000 MCG Caps  Take 1 tablet by mouth daily.     BYSTOLIC 20 MG Tabs  Generic drug:  Nebivolol HCl  Take 20 mg by mouth daily.     cetirizine 10 MG tablet  Commonly known as:  ZYRTEC  Take 10 mg by mouth daily.     furosemide 40 MG tablet  Commonly known as:  LASIX  Take 40 mg by mouth daily.     hydrochlorothiazide 25 MG tablet  Commonly known as:  HYDRODIURIL  Take 25 mg by mouth daily.     HYDROcodone-acetaminophen 5-325 MG per tablet  Commonly known as:  NORCO/VICODIN  Take 2 tablets by mouth 2 (two) times daily as needed for pain.     omeprazole 20 MG capsule  Commonly known as:  PRILOSEC  Take 20 mg by mouth daily.     Saw Palmetto 450 MG Caps  Take 450 mg by mouth daily.     tamsulosin 0.4 MG Caps  Commonly known as:  FLOMAX  Take 0.4 mg by mouth daily.     temazepam 15 MG  capsule  Commonly known as:  RESTORIL  Take 15 mg by mouth at bedtime as needed for sleep.       Follow-up Information   Follow up with [facility]. (Our office will call you to arrange an appointment to have your staples removed)    Contact information:   [phone]     TIME SPENT ON DISCHARGE, INCLUDING PHYSICIAN TIME: >30 MINUTES  Signed: Allayne Butcher, PA-C 07/25/2012, 3:24 PM

## 2012-07-24 ENCOUNTER — Emergency Department (HOSPITAL_COMMUNITY): Payer: Medicare Other

## 2012-07-24 ENCOUNTER — Emergency Department (HOSPITAL_COMMUNITY)
Admission: EM | Admit: 2012-07-24 | Discharge: 2012-07-24 | Disposition: A | Discharge status: Home / Self Care | Payer: Medicare Other | Attending: Emergency Medicine | Admitting: Emergency Medicine

## 2012-07-24 ENCOUNTER — Encounter (HOSPITAL_COMMUNITY): Payer: Self-pay

## 2012-07-24 DIAGNOSIS — M109 Gout, unspecified: Secondary | ICD-10-CM | POA: Insufficient documentation

## 2012-07-24 DIAGNOSIS — Z79899 Other long term (current) drug therapy: Secondary | ICD-10-CM | POA: Insufficient documentation

## 2012-07-24 DIAGNOSIS — N429 Disorder of prostate, unspecified: Secondary | ICD-10-CM | POA: Insufficient documentation

## 2012-07-24 DIAGNOSIS — I10 Essential (primary) hypertension: Secondary | ICD-10-CM | POA: Insufficient documentation

## 2012-07-24 DIAGNOSIS — Z7982 Long term (current) use of aspirin: Secondary | ICD-10-CM | POA: Insufficient documentation

## 2012-07-24 DIAGNOSIS — Z87891 Personal history of nicotine dependence: Secondary | ICD-10-CM | POA: Insufficient documentation

## 2012-07-24 DIAGNOSIS — Z950 Presence of cardiac pacemaker: Secondary | ICD-10-CM | POA: Insufficient documentation

## 2012-07-24 DIAGNOSIS — K219 Gastro-esophageal reflux disease without esophagitis: Secondary | ICD-10-CM | POA: Insufficient documentation

## 2012-07-24 LAB — POCT I-STAT, CHEM 8
Chloride: 104 mEq/L (ref 96–112)
HCT: 33 % — ABNORMAL LOW (ref 39.0–52.0)
Hemoglobin: 11.2 g/dL — ABNORMAL LOW (ref 13.0–17.0)
Potassium: 3.5 mEq/L (ref 3.5–5.1)
Sodium: 139 mEq/L (ref 135–145)

## 2012-07-24 MED ORDER — PREDNISONE 20 MG PO TABS: 40.0000 mg | ORAL_TABLET | Freq: Every day | ORAL | Status: DC

## 2012-07-24 MED ORDER — PREDNISONE 20 MG PO TABS
60.0000 mg | ORAL_TABLET | Freq: Once | ORAL | Status: AC
  Administered 2012-07-24: 60 mg via ORAL
  Filled 2012-07-24: qty 3

## 2012-07-24 NOTE — ED Provider Notes (Signed)
Medical screening examination/treatment/procedure(s) were performed by non-physician practitioner and as supervising physician I was immediately available for consultation/collaboration.   Rylah Fukuda, MD 07/24/12 1456 

## 2012-07-24 NOTE — ED Notes (Signed)
Per Jaci Carrel, [place], this RN is to remove the patient's dressing over his pacemaker.

## 2012-07-24 NOTE — ED Provider Notes (Signed)
History     CSN: [id number]  Arrival date & time 07/24/12  1000   First MD Initiated Contact with Patient 07/24/12 1012      Chief Complaint  Patient presents with  . Foot Pain    (Consider location/radiation/quality/duration/timing/severity/associated sxs/prior treatment) HPI Derrick Long is a 79 y.o. male w a hx of HTN and recent pacemaker placement (2 days ago) c/o left foot pain and swelling. Onset began once dc from hospital. Pt describes sharp knife and pin sensation at first metacarpal joint with associated swelling and warmth. Pain is worsened by palpation, ambulation or ROM. Today right foot began hurting as well. No hx of trama or gout. Denies CP, SOB, cough, hemoptysis, calf pain, fever NS or chills.   Past Medical History  Diagnosis Date  . Hypertension   . Acid reflux   . Prostate disease   . Ex-cigarette smoker     Past Surgical History  Procedure Laterality Date  . Prostate surgery      No family history on file.  History  Substance Use Topics  . Smoking status: Former Smoker    Quit date: 07/20/2002  . Smokeless tobacco: Not on file  . Alcohol Use: No      Review of Systems Ten systems reviewed and are negative for acute change, except as noted in the HPI.   Allergies  Review of patient's allergies indicates no known allergies.  Home Medications   Current Outpatient Rx  Name  Route  Sig  Dispense  Refill  . AMLODIPINE BESYLATE PO   Oral   Take 10 mg by mouth daily.         Marland Kitchen aspirin EC 81 MG EC tablet   Oral   Take 1 tablet (81 mg total) by mouth daily.         Marland Kitchen atorvastatin (LIPITOR) 20 MG tablet   Oral   Take 1 tablet (20 mg total) by mouth daily at 6 PM.   30 tablet   5   . cetirizine (ZYRTEC) 10 MG tablet   Oral   Take 10 mg by mouth daily.         . Cyanocobalamin (B-12) 1000 MCG CAPS   Oral   Take 1 tablet by mouth daily.         . furosemide (LASIX) 40 MG tablet   Oral   Take 40 mg by mouth daily.          . hydrochlorothiazide (HYDRODIURIL) 25 MG tablet   Oral   Take 25 mg by mouth daily.         Marland Kitchen HYDROcodone-acetaminophen (NORCO/VICODIN) 5-325 MG per tablet   Oral   Take 2 tablets by mouth 2 (two) times daily as needed for pain.          . Nebivolol HCl (BYSTOLIC) 20 MG TABS   Oral   Take 20 mg by mouth daily.         . niacin (NIASPAN) 500 MG CR tablet   Oral   Take 500 mg by mouth at bedtime.         Marland Kitchen omeprazole (PRILOSEC) 20 MG capsule   Oral   Take 20 mg by mouth daily.         . Saw Palmetto 450 MG CAPS   Oral   Take 450 mg by mouth daily.         . tamsulosin (FLOMAX) 0.4 MG CAPS   Oral   Take 0.4 mg by  mouth daily.         . temazepam (RESTORIL) 15 MG capsule   Oral   Take 15 mg by mouth at bedtime as needed for sleep.           BP 148/64  Pulse 58  Temp(Src) 98.3 F (36.8 C) (Oral)  Resp 15  SpO2 98%  Physical Exam  Nursing note and vitals reviewed. Constitutional: He appears well-developed and well-nourished. No distress.  HENT:  Head: Normocephalic and atraumatic.  Eyes: Conjunctivae and EOM are normal.  Neck: Normal range of motion. Neck supple.  Cardiovascular:  No pitting edema Intact distal pulses, capillary refill < 3 seconds  Musculoskeletal:  No calf pain. Left foot 1st metatarsal tender, swollen and warm w painful ROM. Tender throguh out forefoot.  All other extremities with normal ROM  Neurological:  No sensory deficit  Skin: He is not diaphoretic.  Skin intact, no tenting    ED Course  Procedures (including critical care time)  Labs Reviewed - No data to display Dg Chest 2 View  07/23/2012   *RADIOLOGY REPORT*  Clinical Data: Pacemaker placement  CHEST - 2 VIEW  Comparison: 07/19/2012  Findings: Plate-like atelectasis in the lingula.  The lungs are otherwise essentially clear. No pleural effusion or pneumothorax.  The heart is normal in size.  Interval placement of a left subclavian dual lead pacemaker in  satisfactory position.  Mild degenerative changes of the visualized thoracolumbar spine.  Cholecystectomy clips.  IMPRESSION: Interval placement of a left subclavian dual lead pacemaker in satisfactory position.  No pneumothorax.   Original Report Authenticated By: Charline Bills, M.D.     No diagnosis found.    MDM  Pt presents with monoarticular pain, swelling and erythema.  Pt is afebrile and stable. Imaging reviewed, no evidence of occult fracture or injury. Will dc on prednisone based on age. Discussed that pt should respond to treatment with in 24 hour of begining treatment & likely resolve in 2-3 days.  PCP follow up recommended.         Jaci Carrel, [place] 07/24/12 1155

## 2012-07-24 NOTE — ED Notes (Signed)
Pt here for foot pain and swelling since having PM placed 2 days ago.

## 2012-07-29 ENCOUNTER — Telehealth: Payer: Self-pay | Admitting: Cardiology

## 2012-07-29 NOTE — Telephone Encounter (Signed)
Had a pacemaker put in last Friday-started having gout-went to ER-still suffering with and no medicine left!

## 2012-07-29 NOTE — Telephone Encounter (Signed)
Returned call and spoke w/ pt's daughter, Harden Mo.  Stated pt was in the hospital last week and had a pacemaker put in Friday.  Stated pt was taken to ER for pain in his feet and was told he has gout.  Stated pt finished the medicine today and is having pain, burning and swelling in his feet.  Wanted to know if something can be called in for him for his pain and gout.  Advised daughter to contact pt's PCP for evaluation and treatment.  Daughter stated she called and the PCP is out of the office today.  Stated "so you telling me he has to go back to the hospital for this?  Are y'all gonna do anything or not?"  Stated pt was put on two new meds while in the hospital and she wants to know if they may be causing his gout.  Stated new meds were atorvastatin and cyanocobalamin.  RN looked up both meds for SEs and unable to find reaction w/ gout and daughter informed.  Again advised to contact PCP's office if another provider is available or to go to an urgent care vs ER.  Daughter upset w/ advice and thanked Charity fundraiser for assistance.

## 2012-07-30 ENCOUNTER — Encounter (HOSPITAL_COMMUNITY): Payer: Self-pay | Admitting: *Deleted

## 2012-07-30 DIAGNOSIS — M109 Gout, unspecified: Secondary | ICD-10-CM | POA: Insufficient documentation

## 2012-07-30 DIAGNOSIS — D72829 Elevated white blood cell count, unspecified: Secondary | ICD-10-CM | POA: Insufficient documentation

## 2012-07-30 DIAGNOSIS — R079 Chest pain, unspecified: Principal | ICD-10-CM | POA: Insufficient documentation

## 2012-07-30 DIAGNOSIS — N179 Acute kidney failure, unspecified: Secondary | ICD-10-CM | POA: Insufficient documentation

## 2012-07-30 DIAGNOSIS — E876 Hypokalemia: Secondary | ICD-10-CM | POA: Insufficient documentation

## 2012-07-30 DIAGNOSIS — R55 Syncope and collapse: Secondary | ICD-10-CM | POA: Insufficient documentation

## 2012-07-30 NOTE — ED Notes (Signed)
Left sided chest pain. Did not take any pain reliever medication. Pacemaker placed on 07/22/12. Also, feet swollen.

## 2012-07-31 ENCOUNTER — Emergency Department (HOSPITAL_COMMUNITY): Payer: Medicare Other

## 2012-07-31 ENCOUNTER — Observation Stay (HOSPITAL_COMMUNITY)
Admission: EM | Admit: 2012-07-31 | Discharge: 2012-08-01 | Disposition: A | Discharge status: Home / Self Care | Payer: Medicare Other | Attending: Family Medicine | Admitting: Family Medicine

## 2012-07-31 DIAGNOSIS — N179 Acute kidney failure, unspecified: Secondary | ICD-10-CM

## 2012-07-31 DIAGNOSIS — M109 Gout, unspecified: Secondary | ICD-10-CM

## 2012-07-31 DIAGNOSIS — E876 Hypokalemia: Secondary | ICD-10-CM | POA: Diagnosis present

## 2012-07-31 DIAGNOSIS — D72829 Elevated white blood cell count, unspecified: Secondary | ICD-10-CM

## 2012-07-31 DIAGNOSIS — R079 Chest pain, unspecified: Secondary | ICD-10-CM | POA: Diagnosis present

## 2012-07-31 DIAGNOSIS — R0789 Other chest pain: Secondary | ICD-10-CM

## 2012-07-31 HISTORY — DX: Acute kidney failure, unspecified: N17.9

## 2012-07-31 HISTORY — DX: Elevated white blood cell count, unspecified: D72.829

## 2012-07-31 HISTORY — DX: Chest pain, unspecified: R07.9

## 2012-07-31 LAB — BASIC METABOLIC PANEL
BUN: 25 mg/dL — ABNORMAL HIGH (ref 6–23)
CO2: 27 mEq/L (ref 19–32)
Calcium: 8.9 mg/dL (ref 8.4–10.5)
Chloride: 101 mEq/L (ref 96–112)
Chloride: 97 mEq/L (ref 96–112)
Creatinine, Ser: 1.23 mg/dL (ref 0.50–1.35)
GFR calc non Af Amer: 46 mL/min — ABNORMAL LOW (ref >90)
Glucose, Bld: 129 mg/dL — ABNORMAL HIGH (ref 70–99)
Glucose, Bld: 97 mg/dL (ref 70–99)
Potassium: 3.1 mEq/L — ABNORMAL LOW (ref 3.5–5.1)
Sodium: 136 mEq/L (ref 135–145)

## 2012-07-31 LAB — POCT I-STAT TROPONIN I: Troponin i, poc: 0.03 ng/mL (ref 0.00–0.08)

## 2012-07-31 LAB — TROPONIN I: Troponin I: <0.3 ng/mL (ref <0.30)

## 2012-07-31 LAB — CBC
HCT: 33.5 % — ABNORMAL LOW (ref 39.0–52.0)
HCT: 36.9 % — ABNORMAL LOW (ref 39.0–52.0)
Hemoglobin: 12.8 g/dL — ABNORMAL LOW (ref 13.0–17.0)
MCH: 32 pg (ref 26.0–34.0)
MCHC: 34.7 g/dL (ref 30.0–36.0)
MCV: 91.5 fL (ref 78.0–100.0)
RBC: 3.66 MIL/uL — ABNORMAL LOW (ref 4.22–5.81)
RBC: 4.01 MIL/uL — ABNORMAL LOW (ref 4.22–5.81)
RDW: 13 % (ref 11.5–15.5)
WBC: 10.3 10*3/uL (ref 4.0–10.5)
WBC: 12.8 10*3/uL — ABNORMAL HIGH (ref 4.0–10.5)

## 2012-07-31 LAB — D-DIMER, QUANTITATIVE: D-Dimer, Quant: 0.91 ug/mL-FEU — ABNORMAL HIGH (ref 0.00–0.48)

## 2012-07-31 MED ORDER — LORATADINE 10 MG PO TABS
10.0000 mg | ORAL_TABLET | Freq: Every day | ORAL | Status: DC
  Administered 2012-07-31: 10 mg via ORAL
  Filled 2012-07-31 (×2): qty 1

## 2012-07-31 MED ORDER — ONDANSETRON HCL 4 MG/2ML IJ SOLN
4.0000 mg | Freq: Once | INTRAMUSCULAR | Status: AC
  Administered 2012-07-31: 4 mg via INTRAVENOUS
  Filled 2012-07-31: qty 2

## 2012-07-31 MED ORDER — ACETAMINOPHEN 325 MG PO TABS: 650.0000 mg | ORAL_TABLET | Freq: Four times a day (QID) | ORAL | Status: DC | PRN

## 2012-07-31 MED ORDER — NEBIVOLOL HCL 10 MG PO TABS
20.0000 mg | ORAL_TABLET | Freq: Every day | ORAL | Status: DC
Start: 2012-07-31 — End: 2012-08-01
  Administered 2012-07-31: 20 mg via ORAL
  Filled 2012-07-31 (×2): qty 2

## 2012-07-31 MED ORDER — OXYCODONE HCL 5 MG PO TABS: 5.0000 mg | ORAL_TABLET | ORAL | Status: DC | PRN

## 2012-07-31 MED ORDER — MORPHINE SULFATE 4 MG/ML IJ SOLN
4.0000 mg | Freq: Once | INTRAMUSCULAR | Status: AC
  Administered 2012-07-31: 4 mg via INTRAVENOUS
  Filled 2012-07-31: qty 1

## 2012-07-31 MED ORDER — ENOXAPARIN SODIUM 40 MG/0.4ML ~~LOC~~ SOLN
40.0000 mg | Freq: Every day | SUBCUTANEOUS | Status: DC
  Administered 2012-07-31: 40 mg via SUBCUTANEOUS
  Filled 2012-07-31 (×2): qty 0.4

## 2012-07-31 MED ORDER — ATORVASTATIN CALCIUM 20 MG PO TABS
20.0000 mg | ORAL_TABLET | Freq: Every day | ORAL | Status: DC
  Administered 2012-07-31: 20 mg via ORAL
  Filled 2012-07-31 (×2): qty 1

## 2012-07-31 MED ORDER — HYDROCHLOROTHIAZIDE 25 MG PO TABS
25.0000 mg | ORAL_TABLET | Freq: Every day | ORAL | Status: DC | PRN
  Filled 2012-07-31: qty 1

## 2012-07-31 MED ORDER — ASPIRIN 81 MG PO CHEW
324.0000 mg | CHEWABLE_TABLET | Freq: Once | ORAL | Status: AC
  Administered 2012-07-31: 324 mg via ORAL
  Filled 2012-07-31: qty 4

## 2012-07-31 MED ORDER — PANTOPRAZOLE SODIUM 40 MG PO TBEC
40.0000 mg | DELAYED_RELEASE_TABLET | Freq: Every day | ORAL | Status: DC
  Administered 2012-07-31: 40 mg via ORAL

## 2012-07-31 MED ORDER — ASPIRIN EC 325 MG PO TBEC
325.0000 mg | DELAYED_RELEASE_TABLET | Freq: Every day | ORAL | Status: DC
Start: 2012-07-31 — End: 2012-08-01
  Administered 2012-07-31: 325 mg via ORAL
  Filled 2012-07-31: qty 1

## 2012-07-31 MED ORDER — PREDNISONE 20 MG PO TABS
40.0000 mg | ORAL_TABLET | Freq: Every day | ORAL | Status: DC
  Administered 2012-07-31: 40 mg via ORAL
  Filled 2012-07-31 (×2): qty 2

## 2012-07-31 MED ORDER — POTASSIUM CHLORIDE CRYS ER 20 MEQ PO TBCR
20.0000 meq | EXTENDED_RELEASE_TABLET | Freq: Two times a day (BID) | ORAL | Status: DC
  Administered 2012-07-31 (×2): 20 meq via ORAL
  Filled 2012-07-31 (×3): qty 1

## 2012-07-31 MED ORDER — TAMSULOSIN HCL 0.4 MG PO CAPS
0.4000 mg | ORAL_CAPSULE | Freq: Every day | ORAL | Status: DC
  Administered 2012-07-31: 0.4 mg via ORAL
  Filled 2012-07-31 (×2): qty 1

## 2012-07-31 MED ORDER — FUROSEMIDE 40 MG PO TABS
40.0000 mg | ORAL_TABLET | Freq: Every day | ORAL | Status: DC
  Administered 2012-07-31: 40 mg via ORAL
  Filled 2012-07-31 (×2): qty 1

## 2012-07-31 MED ORDER — COLCHICINE 0.6 MG PO TABS
0.6000 mg | ORAL_TABLET | Freq: Two times a day (BID) | ORAL | Status: DC
  Administered 2012-07-31 (×2): 0.6 mg via ORAL
  Filled 2012-07-31 (×5): qty 1

## 2012-07-31 MED ORDER — ONDANSETRON 4 MG PO TBDP
8.0000 mg | ORAL_TABLET | Freq: Once | ORAL | Status: AC
  Administered 2012-07-31: 8 mg via ORAL
  Filled 2012-07-31: qty 2

## 2012-07-31 MED ORDER — POTASSIUM CHLORIDE CRYS ER 20 MEQ PO TBCR
40.0000 meq | EXTENDED_RELEASE_TABLET | Freq: Once | ORAL | Status: AC
  Administered 2012-07-31: 40 meq via ORAL
  Filled 2012-07-31: qty 2

## 2012-07-31 MED ORDER — AMLODIPINE BESYLATE 10 MG PO TABS
10.0000 mg | ORAL_TABLET | Freq: Every day | ORAL | Status: DC
  Administered 2012-07-31: 10 mg via ORAL
  Filled 2012-07-31 (×2): qty 1

## 2012-07-31 MED ORDER — ACETAMINOPHEN 650 MG RE SUPP: 650.0000 mg | Freq: Four times a day (QID) | RECTAL | Status: DC | PRN

## 2012-07-31 MED ORDER — ZOLPIDEM TARTRATE 5 MG PO TABS: 5.0000 mg | ORAL_TABLET | Freq: Every evening | ORAL | Status: DC | PRN

## 2012-07-31 MED ORDER — ONDANSETRON HCL 4 MG PO TABS: 4.0000 mg | ORAL_TABLET | Freq: Four times a day (QID) | ORAL | Status: DC | PRN

## 2012-07-31 MED ORDER — HYDROMORPHONE HCL PF 1 MG/ML IJ SOLN: 0.5000 mg | INTRAMUSCULAR | Status: DC | PRN

## 2012-07-31 MED ORDER — SODIUM CHLORIDE 0.9 % IV SOLN
INTRAVENOUS | Status: DC
  Administered 2012-07-31: 1000 mL via INTRAVENOUS

## 2012-07-31 MED ORDER — ONDANSETRON HCL 4 MG/2ML IJ SOLN: 4.0000 mg | Freq: Four times a day (QID) | INTRAMUSCULAR | Status: DC | PRN

## 2012-07-31 MED ORDER — ALUM & MAG HYDROXIDE-SIMETH 200-200-20 MG/5ML PO SUSP: 30.0000 mL | Freq: Four times a day (QID) | ORAL | Status: DC | PRN

## 2012-07-31 MED ORDER — VITAMIN B-12 1000 MCG PO TABS
1000.0000 ug | ORAL_TABLET | Freq: Every day | ORAL | Status: DC
  Administered 2012-07-31: 1000 ug via ORAL
  Filled 2012-07-31 (×2): qty 1

## 2012-07-31 MED ORDER — NIACIN ER (ANTIHYPERLIPIDEMIC) 500 MG PO TBCR
500.0000 mg | EXTENDED_RELEASE_TABLET | Freq: Every day | ORAL | Status: DC
  Administered 2012-07-31: 500 mg via ORAL
  Filled 2012-07-31 (×3): qty 1

## 2012-07-31 NOTE — H&P (Signed)
[facility] History and Physical  ASHE GAGO UJW:[id number] DOB: 09/22/1926 DOA: 07/31/2012  Referring physician:  EDP PCP: Milana Obey, MD  Specialists:   Chief Complaint:  Chest Pain  HPI: Derrick Long is a 79 y.o. male who presents to the ED with complaints of SSCP that feels like chest tightness and is without radiation and rated as a 6/10 at the worst.   The pain started in the evening.  He denies having SOB , nausea or vomiting, or diaphoresis associated with the pain.   He was evaluated in the ED and referred for a cardiac rule out.      Review of Systems: The patient denies anorexia, fever, chills, headaches, weight loss, vision loss, diplopia, dizziness, decreased hearing, rhinitis, hoarseness, syncope, dyspnea on exertion, peripheral edema, balance deficits, cough, hemoptysis, abdominal pain, nausea, vomiting, diarrhea, constipation, hematemesis, melena, hematochezia, severe indigestion/heartburn, dysuria, hematuria, incontinence, muscle weakness, suspicious skin lesions, transient blindness, difficulty walking, depression, unusual weight change, abnormal bleeding, enlarged lymph nodes, angioedema, and breast masses.    Past Medical History  Diagnosis Date  . Hypertension   . Acid reflux   . Prostate disease   . Ex-cigarette smoker     Past Surgical History  Procedure Laterality Date  . Prostate surgery    . Pacemaker insertion      Prior to Admission medications   Medication Sig Start Date End Date Taking? Authorizing Provider  AMLODIPINE BESYLATE PO Take 10 mg by mouth daily.   Yes Historical Provider, MD  aspirin EC 81 MG EC tablet Take 1 tablet (81 mg total) by mouth daily. 07/23/12  Yes Brittainy Simmons, PA-C  atorvastatin (LIPITOR) 20 MG tablet Take 1 tablet (20 mg total) by mouth daily at 6 PM. 07/23/12  Yes Brittainy Sharol Harness, PA-C  cetirizine (ZYRTEC) 10 MG tablet Take 10 mg by mouth daily.   Yes Historical Provider, MD  Cyanocobalamin (B-12)  1000 MCG CAPS Take 1 tablet by mouth daily.   Yes Historical Provider, MD  furosemide (LASIX) 40 MG tablet Take 40 mg by mouth daily.   Yes Historical Provider, MD  hydrochlorothiazide (HYDRODIURIL) 25 MG tablet Take 25 mg by mouth daily as needed (for ankle swelling).    Yes Historical Provider, MD  HYDROcodone-acetaminophen (NORCO/VICODIN) 5-325 MG per tablet Take 2 tablets by mouth 2 (two) times daily as needed for pain.    Yes Historical Provider, MD  Nebivolol HCl (BYSTOLIC) 20 MG TABS Take 20 mg by mouth daily.   Yes Historical Provider, MD  niacin (NIASPAN) 500 MG CR tablet Take 500 mg by mouth at bedtime.   Yes Historical Provider, MD  omeprazole (PRILOSEC) 20 MG capsule Take 20 mg by mouth daily.   Yes Historical Provider, MD  predniSONE (DELTASONE) 20 MG tablet Take 2 tablets (40 mg total) by mouth daily. 07/24/12  Yes Lisette Paz, PA-C  Saw Palmetto 450 MG CAPS Take 450 mg by mouth daily.   Yes Historical Provider, MD  tamsulosin (FLOMAX) 0.4 MG CAPS Take 0.4 mg by mouth daily.   Yes Historical Provider, MD  temazepam (RESTORIL) 15 MG capsule Take 15 mg by mouth at bedtime as needed for sleep.   Yes Historical Provider, MD    No Known Allergies   Social History:    reports that he quit smoking about 10 years ago. He does not have any smokeless tobacco history on file. He reports that he does not drink alcohol or use illicit drugs.     Family History:  CAD in Brothers X 4 and Sister X 1 HTN in Mother  and in Brothers X 4 Cancer in Mother   Physical Exam:  GEN:  Pleasant Elderly well developed  79 y.o. African American male  examined  and in no acute distress; cooperative with exam Filed Vitals:   07/31/12 0202 07/31/12 0300 07/31/12 0315 07/31/12 0343  BP:  130/62 133/59 148/60  Pulse:  60 61 61  Temp: 99.9 F (37.7 C)   98.5 F (36.9 C)  TempSrc: Rectal     Resp:   20 18  Height:    6\' 1"  (1.854 m)  Weight:    87.544 kg (193 lb)  SpO2:  98% 97% 98%   Blood  pressure 148/60, pulse 61, temperature 98.5 F (36.9 C), temperature source Rectal, resp. rate 18, height 6\' 1"  (1.854 m), weight 87.544 kg (193 lb), SpO2 98.00%. PSYCH: He is alert and oriented x4; does not appear anxious does not appear depressed; affect is normal HEENT: Normocephalic and Atraumatic, Mucous membranes pink; PERRLA; EOM intact; Fundi:  Benign;  No scleral icterus, Nares: Patent, Oropharynx: Clear, practically Edentulous with Sparse Poor Dentition, Neck:  FROM, no cervical lymphadenopathy nor thyromegaly or carotid bruit; no JVD; Breasts:: Not examined CHEST WALL: No tenderness CHEST: Normal respiration, clear to auscultation bilaterally HEART: Regular rate and rhythm; no murmurs rubs or gallops BACK: No kyphosis or scoliosis; no CVA tenderness ABDOMEN: Positive Bowel Sounds, soft non-tender; no masses, no organomegaly.    Rectal Exam: Not done EXTREMITIES: No cyanosis, clubbing or edema; no ulcerations. Genitalia: not examined PULSES: 2+ and symmetric SKIN: Normal hydration no rash or ulceration CNS: Cranial nerves 2-12 grossly intact no focal neurologic deficit    Labs on Admission:  Basic Metabolic Panel:  Recent Labs Lab 07/24/12 1218 07/31/12 0008  NA 139 136  K 3.5 3.1*  CL 104 97  CO2  --  28  GLUCOSE 95 129*  BUN 14 25*  CREATININE 1.30 1.36*  CALCIUM  --  9.1   Liver Function Tests: No results found for this basename: AST, ALT, ALKPHOS, BILITOT, PROT, ALBUMIN,  in the last 168 hours No results found for this basename: LIPASE, AMYLASE,  in the last 168 hours No results found for this basename: AMMONIA,  in the last 168 hours CBC:  Recent Labs Lab 07/24/12 1218 07/31/12 0008  WBC  --  12.8*  HGB 11.2* 12.8*  HCT 33.0* 36.9*  MCV  --  92.0  PLT  --  292   Cardiac Enzymes:  Recent Labs Lab 07/31/12 0228  TROPONINI <0.30    BNP (last 3 results)  Recent Labs  07/31/12 0013  PROBNP 444.9   CBG: No results found for this basename:  GLUCAP,  in the last 168 hours  Radiological Exams on Admission: Dg Chest 2 View  07/31/2012   *RADIOLOGY REPORT*  Clinical Data: Chest pain.  CHEST - 2 VIEW  Comparison: 07/23/2012  Findings: Left subclavian pacemaker stable.  Stable scarring or atelectasis in the lateral basal segment left lower lobe.  Vascular clips in the upper abdomen.  Right lung clear.  No effusion.  Heart size normal.  Tortuous thoracic aorta.  IMPRESSION:  1.  No acute disease.   Original Report Authenticated By: D. Andria Rhein, MD     EKG: Independently reviewed.    Assessment/Plan Principal Problem:   Chest pain Active Problems:   Hypokalemia   ARF (acute renal failure)   Leukocytosis, unspecified   Gout  1.  Chest Pain-  Telemetry Monitoring, Cycle Troponins, Nitropaste, O2, ASA Rx, Continue Betablocker RX.    2.   Hypokalemia-  Replete K+and Check Magnesiunm.    3.   ARF-  Gentle rehydration.     4.  Leukocytosis-  Probable tress reaction due to Gout.  Monitor Trend.     5.  Gout-   Check Uric Acid level.  Treat wti Colchicine 0.6mg  PO TID, and steroid Rx, and Pain Rx PRN.      Code Status:    FULL CODE Family Communication:    Daughters at Bedside Disposition Plan:       Return to Home on Discharge  Time spent: 71 Minutes  Ron Parker [facility] Pager [phone]    If 7PM-7AM, please contact night-coverage [email or web address] Password [facility] 07/31/2012, 4:15 AM

## 2012-07-31 NOTE — ED Notes (Signed)
Pt reports left sided chest pressure last evening. States that pressure is worth with movement and with coughing. Pt had a pacemaker placed in left chest wall last week and is still wearing a sling for the left arm. Pt takes a daily baby asa and took one yesterday am. Pt denies chest pressure at this time.

## 2012-07-31 NOTE — Progress Notes (Signed)
8:53 AM I agree with HPI/GPe and A/P per Dr. Lovell Sheehan  Patient states he started having chest pain yesterday without radiation He has acute pain that started right after the cardiac catheterization and was most likely felt. He is on a thiazide diuretic. He came to the emergency room 07/23/2012 and was given prednisone which did not really help. Is more comfortable now and sitting up in bed but still has pretty bad leg pain with swelling. He has no specific lesion or skin denudation.       Patient Active Problem List   Diagnosis Date Noted  . Chest pain 07/31/2012  . Leukocytosis, unspecified 07/31/2012  . ARF (acute renal failure) 07/31/2012  . Gout 07/31/2012  . First degree AV block, PR interval 370 ms 07/19/2012  . Chest pain, atypical 07/19/2012  . Acute renal insufficiency 07/19/2012  . Hypokalemia 07/19/2012  . Syncope 07/19/2012  . Bradycardia 07/19/2012  . LUMBAR STRAIN 09/10/2008  . ANEMIA, IRON DEFICIENCY NOS 08/16/2008  . OSTEOARTHRITIS, MODERATE 09/21/2007  . INSOMNIA 06/24/2007  . HYPERLIPIDEMIA 04/05/2007  . DENTAL CARIES 10/08/2006  . LATERAL MENISCUS TEAR, RIGHT 04/19/2006  . MIGRAINE HEADACHE 12/18/2005  . HYPERTENSION 12/18/2005  . GERD 12/18/2005  . RENAL CALCULUS 12/18/2005  . BENIGN PROSTATIC HYPERTROPHY, WITH OBSTRUCTION 12/18/2005  . ARTHRITIS 12/18/2005  . LOW BACK PAIN 12/18/2005  . OSTEOPENIA 12/18/2005  . COLON CANCER, HX OF 12/18/2005  . PROSTATE CANCER, HX OF 12/18/2005   1. Very unlikely cardiogenic chest pain recent cardiac cath showing mild disease 3020% LAD-patient has a slight cough but no sputum. Chest x-ray done shows no acute disease process 2. ? Gout-start colchicine 0.6 twice a day. Await uric acid   Further plans as per Dr. Odessa Fleming, MD [facility] (P) [phone]

## 2012-07-31 NOTE — ED Provider Notes (Signed)
History     CSN: [id number]  Arrival date & time 07/30/12  2316   First MD Initiated Contact with Patient 07/31/12 0025      Chief Complaint  Patient presents with  . Chest Pain    (Consider location/radiation/quality/duration/timing/severity/associated sxs/prior treatment) HPI Hx per PT - substernal chest tightness, onset tonight, pain does not radiate, no SOB, no N/V, no diaphoresis, has pacemaker placed 07/22/12. No redness, swelling or pain at insertion site, no F/C.  He has had LE swelling and pain for the last week that he attributes to gout. Pain mod in severity  Past Medical History  Diagnosis Date  . Hypertension   . Acid reflux   . Prostate disease   . Ex-cigarette smoker     Past Surgical History  Procedure Laterality Date  . Prostate surgery    . Pacemaker insertion      No family history on file.  History  Substance Use Topics  . Smoking status: Former Smoker    Quit date: 07/20/2002  . Smokeless tobacco: Not on file  . Alcohol Use: No      Review of Systems  Constitutional: Negative for fever and chills.  HENT: Negative for neck pain and neck stiffness.   Eyes: Negative for pain.  Respiratory: Negative for shortness of breath.   Cardiovascular: Positive for chest pain.  Gastrointestinal: Negative for abdominal pain.  Genitourinary: Negative for dysuria.  Musculoskeletal: Negative for back pain.  Skin: Negative for rash.  Neurological: Negative for headaches.  All other systems reviewed and are negative.    Allergies  Review of patient's allergies indicates no known allergies.  Home Medications   Current Outpatient Rx  Name  Route  Sig  Dispense  Refill  . AMLODIPINE BESYLATE PO   Oral   Take 10 mg by mouth daily.         Marland Kitchen aspirin EC 81 MG EC tablet   Oral   Take 1 tablet (81 mg total) by mouth daily.         Marland Kitchen atorvastatin (LIPITOR) 20 MG tablet   Oral   Take 1 tablet (20 mg total) by mouth daily at 6 PM.   30 tablet   5   . cetirizine (ZYRTEC) 10 MG tablet   Oral   Take 10 mg by mouth daily.         . Cyanocobalamin (B-12) 1000 MCG CAPS   Oral   Take 1 tablet by mouth daily.         . furosemide (LASIX) 40 MG tablet   Oral   Take 40 mg by mouth daily.         . hydrochlorothiazide (HYDRODIURIL) 25 MG tablet   Oral   Take 25 mg by mouth daily.         Marland Kitchen HYDROcodone-acetaminophen (NORCO/VICODIN) 5-325 MG per tablet   Oral   Take 2 tablets by mouth 2 (two) times daily as needed for pain.          . Nebivolol HCl (BYSTOLIC) 20 MG TABS   Oral   Take 20 mg by mouth daily.         . niacin (NIASPAN) 500 MG CR tablet   Oral   Take 500 mg by mouth at bedtime.         Marland Kitchen omeprazole (PRILOSEC) 20 MG capsule   Oral   Take 20 mg by mouth daily.         . predniSONE (DELTASONE) 20 MG  tablet   Oral   Take 2 tablets (40 mg total) by mouth daily.   10 tablet   0   . Saw Palmetto 450 MG CAPS   Oral   Take 450 mg by mouth daily.         . tamsulosin (FLOMAX) 0.4 MG CAPS   Oral   Take 0.4 mg by mouth daily.         . temazepam (RESTORIL) 15 MG capsule   Oral   Take 15 mg by mouth at bedtime as needed for sleep.           BP 127/63  Pulse 71  Temp(Src) 99.9 F (37.7 C) (Rectal)  Resp 12  SpO2 96%  Physical Exam  Constitutional: He is oriented to person, place, and time. He appears well-developed and well-nourished.  HENT:  Head: Normocephalic and atraumatic.  Eyes: EOM are normal. Pupils are equal, round, and reactive to light.  Neck: Neck supple.  Cardiovascular: Normal rate, regular rhythm and intact distal pulses.   Pulmonary/Chest: Effort normal and breath sounds normal. No respiratory distress. He exhibits no tenderness.  Abdominal: Soft. Bowel sounds are normal. He exhibits no distension. There is no tenderness.  Musculoskeletal: Normal range of motion. He exhibits no edema.  L great toe TTP inc warmth to touch  Neurological: He is alert and oriented to  person, place, and time.  Skin: Skin is warm and dry.    ED Course  Procedures (including critical care time)  Labs Reviewed  CBC - Abnormal; Notable for the following:    WBC 12.8 (*)    RBC 4.01 (*)    Hemoglobin 12.8 (*)    HCT 36.9 (*)    All other components within normal limits  BASIC METABOLIC PANEL - Abnormal; Notable for the following:    Potassium 3.1 (*)    Glucose, Bld 129 (*)    BUN 25 (*)    Creatinine, Ser 1.36 (*)    GFR calc non Af Amer 46 (*)    GFR calc Af Amer 53 (*)    All other components within normal limits  PRO B NATRIURETIC PEPTIDE  POCT I-STAT TROPONIN I   Dg Chest 2 View  07/31/2012   *RADIOLOGY REPORT*  Clinical Data: Chest pain.  CHEST - 2 VIEW  Comparison: 07/23/2012  Findings: Left subclavian pacemaker stable.  Stable scarring or atelectasis in the lateral basal segment left lower lobe.  Vascular clips in the upper abdomen.  Right lung clear.  No effusion.  Heart size normal.  Tortuous thoracic aorta.  IMPRESSION:  1.  No acute disease.   Original Report Authenticated By: D. Andria Rhein, MD     1. Chest pain     Date: 07/31/2012  Rate: 73  Rhythm: paced  QRS Axis: indeterminate  Intervals: normal  ST/T Wave abnormalities: nonspecific ST/T changes  Conduction Disutrbances:nonspecific intraventricular conduction delay  Narrative Interpretation:   Old EKG Reviewed:  IV morphine  2:11 AM CAR consult, d/w Dr Lovell Sheehan, will admit   MDM  CP/ tightness  ECG, Labs, CXR  IV morphine  Admit        Sunnie Nielsen, MD 07/31/12 2140

## 2012-07-31 NOTE — ED Notes (Signed)
Patient transported to X-ray 

## 2012-08-01 DIAGNOSIS — R079 Chest pain, unspecified: Secondary | ICD-10-CM

## 2012-08-01 MED ORDER — COLCHICINE 0.6 MG PO TABS: 0.6000 mg | ORAL_TABLET | Freq: Two times a day (BID) | ORAL | Status: DC

## 2012-08-01 NOTE — Evaluation (Signed)
Physical Therapy Evaluation Patient Details Name: Derrick Long MRN: [id number] DOB: 1926-05-16 Today's Date: 08/01/2012 Time: 4098-1191 PT Time Calculation (min): 10 min  PT Assessment / Plan / Recommendation Clinical Impression  79 year old male recently discharge from hospital 07/25/2012 for syncope secondary to bradycardia status post pacemaker placement read present hospital 07/31/2012. Complained of chest pain and gout flare up.  He presents today moving well, pre pt report unsteady on his feet due to left arm sling.  Requested a cane which he uses occationally at home.  He walked well, slower gait speed.  Pt declining HH f/u.  I anticipate he will be back to his mobility baseline on his own once the sling is removed and he becomes more active.      PT Assessment  Patent does not need any further PT services    Follow Up Recommendations  No PT follow up    Does the patient have the potential to tolerate intense rehabilitation     NA  Barriers to Discharge   None      Equipment Recommendations  None recommended by PT    Recommendations for Other Services   None  Frequency      Precautions / Restrictions Precautions Precautions: ICD/Pacemaker Precaution Comments: Cardiac Required Braces or Orthoses: Other Brace/Splint Other Brace/Splint: L arm sling until 08/03/12 per pt report Restrictions Weight Bearing Restrictions: No   Pertinent Vitals/Pain See vitals flow sheet.       Mobility  Bed Mobility Bed Mobility: Sitting - Scoot to Edge of Bed Sitting - Scoot to Edge of Bed: 6: Modified independent (Device/Increase time) Transfers Sit to Stand: 6: Modified independent (Device/Increase time);With upper extremity assist;From bed Stand to Sit: 6: Modified independent (Device/Increase time);With upper extremity assist;To bed Details for Transfer Assistance: Pt needed two attempts to get to standing from lower bed.   Ambulation/Gait Ambulation/Gait Assistance: 5:  Supervision Ambulation Distance (Feet): 150 Feet Assistive device: Straight cane Ambulation/Gait Assistance Details: supervision for safety due to slower gait speed.   Gait Pattern: Within Functional Limits Gait velocity: greater than 1.8, but less than independent community ambulator.          Visit Information  Last PT Received On: 08/01/12 Assistance Needed: +1    Subjective Data  Subjective: Pt reports that since he had had the sling on his left arm he feels unsteady Patient Stated Goal: to go home today   Prior Functioning  Home Living Lives With: Spouse;Other (Comment) (granddaughter) Available Help at Discharge: Family;Available 24 hours/day Type of Home: House Home Access: Stairs to enter [employer] of Steps: 2 Entrance Stairs-Rails: None Home Layout: One level Bathroom Shower/Tub: Tub/shower unit;Door [employer] Toilet: Standard Home Adaptive Equipment: Grab bars in shower;Grab bars around toilet Prior Function Level of Independence: Independent with assistive device(s) Able to Take Stairs?: Yes (with cane) Driving: Yes Vocation: Retired Comments: likes to work in the yard, mowing, gardening.   Communication Communication: No difficulties Dominant Hand: Right    Cognition  Cognition Arousal/Alertness: Awake/alert Behavior During Therapy: WFL for tasks assessed/performed Overall Cognitive Status: Within Functional Limits for tasks assessed    Extremity/Trunk Assessment Right Upper Extremity Assessment RUE ROM/Strength/Tone: Within functional levels;WFL for tasks assessed RUE Coordination: WFL - gross/fine motor Left Upper Extremity Assessment LUE ROM/Strength/Tone: Unable to fully assess (Sling LUE secondary recent cardiac surgery.) LUE Coordination: WFL - gross/fine motor Right Lower Extremity Assessment RLE ROM/Strength/Tone: WFL for tasks assessed Left Lower Extremity Assessment LLE ROM/Strength/Tone: Within functional levels   Balance  Balance  Balance Assessed: Yes Static Sitting Balance Static Sitting - Balance Support: No upper extremity supported;Feet supported Static Sitting - Level of Assistance: 7: Independent Dynamic Sitting Balance Dynamic Sitting - Balance Support: No upper extremity supported;During functional activity Static Standing Balance Static Standing - Balance Support: No upper extremity supported;During functional activity Dynamic Standing Balance Dynamic Standing - Balance Support: Right upper extremity supported;During functional activity Dynamic Standing - Level of Assistance: 5: Stand by assistance Dynamic Standing - Comments: during gait.  End of Session PT - End of Session Activity Tolerance: Patient tolerated treatment well Patient left: in bed;with call bell/phone within reach (seated EOB) Nurse Communication: Mobility status;Other (comment) (no D/c needs)  GP Functional Assessment Tool Used: gait speed Functional Limitation: Mobility: Walking and moving around Mobility: Walking and Moving Around Current Status [phone]): At least 1 percent but less than 20 percent impaired, limited or restricted Mobility: Walking and Moving Around Goal Status [phone]): At least 1 percent but less than 20 percent impaired, limited or restricted Mobility: Walking and Moving Around Discharge Status [phone]): At least 1 percent but less than 20 percent impaired, limited or restricted   Prentiss Hammett B. Almus Woodham, PT, DPT [phone]   08/01/2012, 8:59 AM

## 2012-08-01 NOTE — Discharge Summary (Signed)
Physician Discharge Summary  CEDRIC MCCLAINE YNW:[id number] DOB: [birth year] DOA: 07/31/2012  PCP: Milana Obey, MD  Admit date: 07/31/2012 Discharge date: 08/01/2012  Time spent: 22 minutes  Recommendations for Outpatient Follow-up:  1. Discontinue thiazide diuretics 2. Continue colchicine when necessary lower extremity pain 3. Follow outpatient with primary care physician/cardiologist   Discharge Diagnoses:  Principal Problem:   Chest pain Active Problems:   Hypokalemia   Leukocytosis, unspecified   ARF (acute renal failure)   Gout   Discharge Condition: Good  Diet recommendation: Heart healthy  Filed Weights   07/31/12 0343  Weight: 87.544 kg (193 lb)    History of present illness:  79 year old male recently discharge from hospital 07/25/2012 for syncope secondary to bradycardia status post pacemaker placement read present hospital 07/31/2012. Complained of chest pain and ruled out x3, pain in the chest was secondary to movement of the arm and seems to be a little placement of pacemaker. Patient unable to elevate given will gouty flare-never previously diagnosed with gout. Notable that patient is a thiazide diuretic-uric acid on this admission 8.0 Patient started on colchicine with good effect patient at baseline functional status. Patient discharged from the followup with regular physician in the outpatient setting  Discharge Exam: Filed Vitals:   07/31/12 1425 07/31/12 2000 08/01/12 0001 08/01/12 0400  BP: 124/60 140/60 143/61 145/63  Pulse: 61 54 64 62  Temp: 98.7 F (37.1 C) 98.6 F (37 C) 99 F (37.2 C) 98.3 F (36.8 C)  TempSrc: Oral     Resp: 18 16 16 16   Height:      Weight:      SpO2: 97% 98% 98% 99%   Doing well no problems, ambulating well, pain enlargement is much better  General: Alert oriented Cardiovascular: S1-S2 paced rhythm Respiratory: Clear  Discharge Instructions  Discharge Orders   Future Appointments Provider Department Dept  Phone   08/03/2012 10:00 AM Nada Boozer, NP [facility] [place] [phone]   Future Orders Complete By Expires     Call MD for:  difficulty breathing, headache or visual disturbances  As directed     Call MD for:  hives  As directed     Call MD for:  persistant dizziness or light-headedness  As directed     Call MD for:  redness, tenderness, or signs of infection (pain, swelling, redness, odor or green/yellow discharge around incision site)  As directed     Call MD for:  temperature >100.4  As directed     Diet - low sodium heart healthy  As directed     Increase activity slowly  As directed         Medication List    STOP taking these medications       hydrochlorothiazide 25 MG tablet  Commonly known as:  HYDRODIURIL     predniSONE 20 MG tablet  Commonly known as:  DELTASONE      TAKE these medications       AMLODIPINE BESYLATE PO  Take 10 mg by mouth daily.     aspirin 81 MG EC tablet  Take 1 tablet (81 mg total) by mouth daily.     atorvastatin 20 MG tablet  Commonly known as:  LIPITOR  Take 1 tablet (20 mg total) by mouth daily at 6 PM.     B-12 1000 MCG Caps  Take 1 tablet by mouth daily.     BYSTOLIC 20 MG Tabs  Generic drug:  Nebivolol HCl  Take  20 mg by mouth daily.     cetirizine 10 MG tablet  Commonly known as:  ZYRTEC  Take 10 mg by mouth daily.     colchicine 0.6 MG tablet  Take 1 tablet (0.6 mg total) by mouth 2 (two) times daily.     furosemide 40 MG tablet  Commonly known as:  LASIX  Take 40 mg by mouth daily.     HYDROcodone-acetaminophen 5-325 MG per tablet  Commonly known as:  NORCO/VICODIN  Take 2 tablets by mouth 2 (two) times daily as needed for pain.     niacin 500 MG CR tablet  Commonly known as:  NIASPAN  Take 500 mg by mouth at bedtime.     omeprazole 20 MG capsule  Commonly known as:  PRILOSEC  Take 20 mg by mouth daily.     Saw Palmetto 450 MG Caps  Take 450 mg by mouth daily.      tamsulosin 0.4 MG Caps  Commonly known as:  FLOMAX  Take 0.4 mg by mouth daily.     temazepam 15 MG capsule  Commonly known as:  RESTORIL  Take 15 mg by mouth at bedtime as needed for sleep.       No Known Allergies    The results of significant diagnostics from this hospitalization (including imaging, microbiology, ancillary and laboratory) are listed below for reference.    Significant Diagnostic Studies: Dg Chest 2 View  07/31/2012   *RADIOLOGY REPORT*  Clinical Data: Chest pain.  CHEST - 2 VIEW  Comparison: 07/23/2012  Findings: Left subclavian pacemaker stable.  Stable scarring or atelectasis in the lateral basal segment left lower lobe.  Vascular clips in the upper abdomen.  Right lung clear.  No effusion.  Heart size normal.  Tortuous thoracic aorta.  IMPRESSION:  1.  No acute disease.   Original Report Authenticated By: D. Andria Rhein, MD   Dg Chest 2 View  07/23/2012   *RADIOLOGY REPORT*  Clinical Data: Pacemaker placement  CHEST - 2 VIEW  Comparison: 07/19/2012  Findings: Plate-like atelectasis in the lingula.  The lungs are otherwise essentially clear. No pleural effusion or pneumothorax.  The heart is normal in size.  Interval placement of a left subclavian dual lead pacemaker in satisfactory position.  Mild degenerative changes of the visualized thoracolumbar spine.  Cholecystectomy clips.  IMPRESSION: Interval placement of a left subclavian dual lead pacemaker in satisfactory position.  No pneumothorax.   Original Report Authenticated By: Charline Bills, M.D.   Dg Chest Port 1 View  07/19/2012   *RADIOLOGY REPORT*  Clinical Data: Chest pain.  PORTABLE CHEST - 1 VIEW  Comparison: 10/07/2009  Findings: The heart size and pulmonary vascularity are normal and the lungs are clear.  No acute osseous abnormality.  IMPRESSION: No acute disease.   Original Report Authenticated By: Francene Boyers, M.D.   Dg Foot Complete Left  07/24/2012   *RADIOLOGY REPORT*  Clinical Data: Foot pain.   Unable to walk.  LEFT FOOT - COMPLETE 3+ VIEW  Comparison: No priors.  Findings: Three views of the left foot demonstrate a bunion deformity.  Small plantar calcaneal spur.  No acute displaced fracture or dislocation.  IMPRESSION: 1.  No acute radiographic abnormality of the left foot. 2.  Bunion deformity. 3.  Small plantar calcaneal spur.   Original Report Authenticated By: Trudie Reed, M.D.    Microbiology: No results found for this or any previous visit (from the past 240 hour(s)).   Labs: Basic Metabolic Panel:  Recent Labs Lab 07/31/12 0008 07/31/12 0525  NA 136 136  K 3.1* 3.1*  CL 97 101  CO2 28 27  GLUCOSE 129* 97  BUN 25* 23  CREATININE 1.36* 1.23  CALCIUM 9.1 8.9   Liver Function Tests: No results found for this basename: AST, ALT, ALKPHOS, BILITOT, PROT, ALBUMIN,  in the last 168 hours No results found for this basename: LIPASE, AMYLASE,  in the last 168 hours No results found for this basename: AMMONIA,  in the last 168 hours CBC:  Recent Labs Lab 07/31/12 0008 07/31/12 0525  WBC 12.8* 10.3  HGB 12.8* 11.7*  HCT 36.9* 33.5*  MCV 92.0 91.5  PLT 292 291   Cardiac Enzymes:  Recent Labs Lab 07/31/12 0228 07/31/12 0800 07/31/12 1452  TROPONINI <0.30 <0.30 <0.30   BNP: BNP (last 3 results)  Recent Labs  07/31/12 0013  PROBNP 444.9   CBG: No results found for this basename: GLUCAP,  in the last 168 hours     Signed:  Rhetta Mura  [facility] 08/01/2012, 8:02 AM

## 2012-08-01 NOTE — Evaluation (Signed)
Occupational Therapy Evaluation Patient Details Name: Derrick Long MRN: [id number] DOB: 1926-07-21 Today's Date: 08/01/2012 Time: 1191-4782 OT Time Calculation (min): 24 min  OT Assessment / Plan / Recommendation Clinical Impression  Pt is a 79 y/o male, with recent admit for cardiac surgery then re-admitted, 07/31/12 secondary to CP & gout in feet. He is Mod I - Min A bathing, dressing and self care tasks secondary to LUE in sling s/p recent cardiac cath/pacemaker. Pt requiring min A for simulated tub transfers (performed shower transfer in room w/ SBA) & he verbalized understanding of this. He plans to d/c home later today w/ family assist PRN. No further OT needs at this time.    OT Assessment  Patient does not need any further OT services    Follow Up Recommendations  No OT follow up    Barriers to Discharge      Equipment Recommendations  None recommended by OT    Recommendations for Other Services PT consult  Frequency       Precautions / Restrictions Precautions Precautions: Other (comment) Precaution Comments: Cardiac Required Braces or Orthoses: Other Brace/Splint Other Brace/Splint: L arm sling until 08/03/12 per pt report Restrictions Weight Bearing Restrictions: No   Pertinent Vitals/Pain Pt reports "No pain"    ADL  Eating/Feeding: Performed;Independent Where Assessed - Eating/Feeding: Edge of bed Grooming: Simulated;Wash/dry hands;Wash/dry face;Modified independent Where Assessed - Grooming: Unsupported standing Upper Body Bathing: Simulated;Minimal assistance;Other (comment) (Secondary to sling left UE, pt reports family will assist) Where Assessed - Upper Body Bathing: Unsupported sitting Lower Body Bathing: Simulated;Modified independent Where Assessed - Lower Body Bathing: Unsupported sitting Upper Body Dressing: Simulated;Minimal assistance Where Assessed - Upper Body Dressing: Unsupported sitting Lower Body Dressing: Performed;Modified independent  (Pt Mod I don/doff socks ) Where Assessed - Lower Body Dressing: Unsupported sitting;Unsupported sit to stand Toilet Transfer: Performed;Modified independent Toilet Transfer Method: Sit to Barista: Comfort height toilet Toileting - Clothing Manipulation and Hygiene: Simulated;Supervision/safety;Modified independent Where Assessed - Engineer, mining and Hygiene: Standing;Sit to stand from 3-in-1 or toilet Tub/Shower Transfer: Performed;Supervision/safety;Min guard;Other (comment) Forensic scientist transfer) Tub/Shower Transfer Method: Science writer: Walk in shower;Other (comment) (Has tub at home rec. family SBA for tub transfers) Equipment Used: Gait belt;Other (comment) (L UE in sling secondary to recent cardiac cath/pacemaker) Transfers/Ambulation Related to ADLs: Pt ambulated Mod I level in room from bed, to chair, standing at sink and in/out bathroom & on/off commode. Currently SBA shower transfer. Rec SBA-min A for tub transfers at home at this time, pt verbalized understanding of this. ADL Comments: Pt currently Mod I - Min A bathing, dressing and self care tasks secondary to LUE in sling s/p recent cardiac cath/pacemaker. Pt requiring min A for simulated tub transfers (performed shower transfer in room w/ SBA) & he verbalized understanding of this. He plans to d/c home later today w/ family assist PRN.    OT Diagnosis:    OT Problem List:   OT Treatment Interventions:     OT Goals    Visit Information  Last OT Received On: 08/01/12    Subjective Data  Subjective: Pt reports chest pain yesterday, now resolved as well as foot pain. Patient Stated Goal: Home   Prior Functioning     Home Living Lives With: Spouse;Other (Comment) Available Help at Discharge: Family;Available 24 hours/day Type of Home: House Home Access: Stairs to enter [employer] of Steps: 1 STE Entrance Stairs-Rails: None Home Layout: One  level Bathroom Shower/Tub: Tub/shower unit;Door [employer]  Toilet: Standard Home Adaptive Equipment: Grab bars in shower;Grab bars around toilet Prior Function Level of Independence: Independent Able to Take Stairs?: Yes Driving: Yes Vocation: Retired Musician: No difficulties Dominant Hand: Right    Vision/Perception Vision - History Baseline Vision: Other (comment) (Reading and driving) Patient Visual Report: No change from baseline   Cognition  Cognition Arousal/Alertness: Awake/alert Behavior During Therapy: WFL for tasks assessed/performed Overall Cognitive Status: Within Functional Limits for tasks assessed    Extremity/Trunk Assessment Right Upper Extremity Assessment RUE ROM/Strength/Tone: Within functional levels;WFL for tasks assessed RUE Coordination: WFL - gross/fine motor Left Upper Extremity Assessment LUE ROM/Strength/Tone: Unable to fully assess (Sling LUE secondary recent cardiac surgery.) LUE Coordination: WFL - gross/fine motor    Mobility Bed Mobility Bed Mobility: Sitting - Scoot to Edge of Bed Sitting - Scoot to Edge of Bed: 6: Modified independent (Device/Increase time) Transfers Transfers: Sit to Stand;Stand to Sit Sit to Stand: 6: Modified independent (Device/Increase time);From bed;From toilet;Without upper extremity assist Stand to Sit: 6: Modified independent (Device/Increase time);To bed;To toilet;Without upper extremity assist Details for Transfer Assistance: Pt reports he has grab bar at home on right side.        Balance Balance Balance Assessed: Yes Static Sitting Balance Static Sitting - Balance Support: No upper extremity supported;Feet supported Dynamic Sitting Balance Dynamic Sitting - Balance Support: No upper extremity supported;During functional activity Static Standing Balance Static Standing - Balance Support: No upper extremity supported;During functional activity   End of Session OT - End of  Session Equipment Utilized During Treatment: Gait belt;Other (comment) (LUE in sling s/p recent cardiac surgery) Activity Tolerance: Patient tolerated treatment well Patient left: with call bell/phone within reach;Other (comment);in bed (Sitting EOB eating breakfast) Nurse Communication: Other (comment) (No further OT needs)  GO Functional Assessment Tool Used: Clinical Judgement Functional Limitation: Self care Self Care Current Status (Z3664): At least 1 percent but less than 20 percent impaired, limited or restricted Self Care Goal Status (Q0347): At least 1 percent but less than 20 percent impaired, limited or restricted Self Care Discharge Status [phone]): At least 1 percent but less than 20 percent impaired, limited or restricted   Alm Bustard 08/01/2012, 8:35 AM

## 2012-08-03 ENCOUNTER — Ambulatory Visit (INDEPENDENT_AMBULATORY_CARE_PROVIDER_SITE_OTHER): Payer: Medicare Other | Admitting: Physician Assistant

## 2012-08-03 ENCOUNTER — Ambulatory Visit: Payer: Medicare Other | Admitting: Cardiology

## 2012-08-03 ENCOUNTER — Encounter: Payer: Self-pay | Admitting: Physician Assistant

## 2012-08-03 VITALS — BP 142/70 | HR 68 | Ht 73.0 in | Wt 184.8 lb

## 2012-08-03 DIAGNOSIS — Z950 Presence of cardiac pacemaker: Secondary | ICD-10-CM | POA: Insufficient documentation

## 2012-08-03 DIAGNOSIS — I10 Essential (primary) hypertension: Secondary | ICD-10-CM

## 2012-08-03 NOTE — Assessment & Plan Note (Signed)
Pacer site is healing well with no signs of infection.  Staples were removed.

## 2012-08-03 NOTE — Assessment & Plan Note (Signed)
Blood pressure mildly elevated today. No changes to current therapy will be made.

## 2012-08-03 NOTE — Progress Notes (Signed)
Date:  08/03/2012   ID:  Derrick Long, DOB 1927-02-04, MRN [id number]  PCP:  Milana Obey, MD  Primary Cardiologist:  [place]     History of Present Illness: Derrick Long is a 79 y.o. male  The patient is an 79 year old African American male with a history of hypertension, acid reflux, long first degree AV block, bradycardia, remote tobacco abuse (quit 25 yrs ago) and prostate disease. He has no prior history of coronary disease or MI. He presented to [facility] on 07/19/12 after sustaining a syncopal episode at his home.  He was noted to be bradycardic with a heart rate in the 50s.  His EKG showed nonspecific T wave changes and was essentially unchanged from prior EKG in 2011.  He was noted to have frequent pauses, the longest being 6.11 seconds.  Left heart cath revealed nonobstructive CAD with only 30 and 20% proximal to mid areas of narrowing of the LAD. A temporary transvenous pacemaker was successfully placed.  The patient underwent successful insertion of a [employer] Jude PPM.  Patient presents today for a wound site check and staple removal. He states that he had to be readmitted to the hospital due to a gout flare. At today's doing quite well had no pain or tenderness at the pacer site.  The patient currently denies nausea, vomiting, fever, chest pain, shortness of breath, orthopnea, dizziness, PND, cough, congestion, abdominal pain, hematochezia, melena, lower extremity edema.  Does report some loose stools last few days.   Wt Readings from Last 3 Encounters:  08/03/12 184 lb 12.8 oz (83.825 kg)  07/31/12 193 lb (87.544 kg)  07/22/12 194 lb (87.998 kg)     Past Medical History  Diagnosis Date  . Hypertension   . Acid reflux   . Prostate disease   . Ex-cigarette smoker     Current Outpatient Prescriptions  Medication Sig Dispense Refill  . AMLODIPINE BESYLATE PO Take 10 mg by mouth daily.      Marland Kitchen aspirin EC 81 MG EC tablet Take 1 tablet (81 mg total) by mouth daily.      Marland Kitchen  atorvastatin (LIPITOR) 20 MG tablet Take 1 tablet (20 mg total) by mouth daily at 6 PM.  30 tablet  5  . cetirizine (ZYRTEC) 10 MG tablet Take 10 mg by mouth daily.      . colchicine 0.6 MG tablet Take 1 tablet (0.6 mg total) by mouth 2 (two) times daily.  60 tablet  0  . Cyanocobalamin (B-12) 1000 MCG CAPS Take 1 tablet by mouth daily.      . furosemide (LASIX) 40 MG tablet Take 40 mg by mouth daily.      Marland Kitchen HYDROcodone-acetaminophen (NORCO/VICODIN) 5-325 MG per tablet Take 2 tablets by mouth 2 (two) times daily as needed for pain.       . Nebivolol HCl (BYSTOLIC) 20 MG TABS Take 20 mg by mouth daily.      . niacin (NIASPAN) 500 MG CR tablet Take 500 mg by mouth at bedtime.      Marland Kitchen omeprazole (PRILOSEC) 20 MG capsule Take 20 mg by mouth daily.      . Saw Palmetto 450 MG CAPS Take 450 mg by mouth daily.      . tamsulosin (FLOMAX) 0.4 MG CAPS Take 0.4 mg by mouth daily.      . temazepam (RESTORIL) 15 MG capsule Take 15 mg by mouth at bedtime as needed for sleep.       No  current facility-administered medications for this visit.    Allergies:   No Known Allergies  Social History:  The patient  reports that he quit smoking about 10 years ago. He does not have any smokeless tobacco history on file. He reports that he does not drink alcohol or use illicit drugs.   ROS:  Please see the history of present illness.  All other systems reviewed and negative.   PHYSICAL EXAM: VS:  BP 142/70  Pulse 68  Ht 6\' 1"  (1.854 m)  Wt 184 lb 12.8 oz (83.825 kg)  BMI 24.39 kg/m2 Well nourished, well developed, in no acute distress HEENT: Pupils are equal round react to light accommodation extraocular movements are intact.  Neck: no JVDNo cervical lymphadenopathy. Cardiac: Regular rate and rhythm without murmurs rubs or gallops. Lungs:  clear to auscultation bilaterally, no wheezing, rhonchi or rales Ext: no lower extremity edema.  2+ radial  pulses. Skin: warm and dry.  Patient's site shows no edema,  ecchymosis or erythema. It is nontender Neuro:  Grossly normal   ASSESSMENT AND PLAN:  Problem List Items Addressed This Visit   HYPERTENSION     Blood pressure mildly elevated today. No changes to current therapy will be made.    Pacemaker - Primary     Pacer site is healing well with no signs of infection.  Staples were removed.        Recommend imodium OTC for loose stool.  Also recommended he stay hydrated with extra oral fluids while stools are loose.

## 2012-08-03 NOTE — Patient Instructions (Signed)
Continue on restrictions as provided during discharge from the hospital.  The patient's site becomes red, swollen, painful call our office immediately.

## 2012-08-05 ENCOUNTER — Emergency Department (HOSPITAL_COMMUNITY)
Admission: EM | Admit: 2012-08-05 | Discharge: 2012-08-05 | Disposition: A | Discharge status: Home / Self Care | Payer: Medicare Other | Attending: Emergency Medicine | Admitting: Emergency Medicine

## 2012-08-05 ENCOUNTER — Encounter (HOSPITAL_COMMUNITY): Payer: Self-pay | Admitting: *Deleted

## 2012-08-05 DIAGNOSIS — Z87891 Personal history of nicotine dependence: Secondary | ICD-10-CM | POA: Insufficient documentation

## 2012-08-05 DIAGNOSIS — N492 Inflammatory disorders of scrotum: Secondary | ICD-10-CM

## 2012-08-05 DIAGNOSIS — Z79899 Other long term (current) drug therapy: Secondary | ICD-10-CM | POA: Insufficient documentation

## 2012-08-05 DIAGNOSIS — Z87448 Personal history of other diseases of urinary system: Secondary | ICD-10-CM | POA: Insufficient documentation

## 2012-08-05 DIAGNOSIS — Z7982 Long term (current) use of aspirin: Secondary | ICD-10-CM | POA: Insufficient documentation

## 2012-08-05 DIAGNOSIS — K219 Gastro-esophageal reflux disease without esophagitis: Secondary | ICD-10-CM | POA: Insufficient documentation

## 2012-08-05 DIAGNOSIS — I10 Essential (primary) hypertension: Secondary | ICD-10-CM | POA: Insufficient documentation

## 2012-08-05 DIAGNOSIS — N498 Inflammatory disorders of other specified male genital organs: Secondary | ICD-10-CM | POA: Insufficient documentation

## 2012-08-05 DIAGNOSIS — Z9889 Other specified postprocedural states: Secondary | ICD-10-CM | POA: Insufficient documentation

## 2012-08-05 MED ORDER — DOXYCYCLINE HYCLATE 100 MG PO CAPS: 100.0000 mg | ORAL_CAPSULE | Freq: Two times a day (BID) | ORAL | Status: DC

## 2012-08-05 NOTE — ED Notes (Signed)
Pt states abscess to left groin area. Began draining yesterday and would like to have it checked. NAD.

## 2012-08-05 NOTE — ED Provider Notes (Signed)
Medical screening examination/treatment/procedure(s) were conducted as a shared visit with non-physician practitioner(s) and myself.  I personally evaluated the patient during the encounter  Patient with draining abscess left scrotal groin area. A 3 cm area of induration no significant swelling to the scrotum distally. At this point feel the patient probably treated with the oral antibiotics and soaking of the area daily in warm water for 20 minutes patient has precautions to return if he gets worse at all his primary care doctor he can arrange followup with next week. Important thing is patient understands to return if he starts to get worse at this point in time it's draining spontaneously. I&D not required.  Shelda Jakes, MD 08/05/12 [phone]

## 2012-08-05 NOTE — ED Provider Notes (Signed)
History     CSN: [id number]  Arrival date & time 08/05/12  8295   First MD Initiated Contact with Patient 08/05/12 [phone]      Chief Complaint  Patient presents with  . Abscess    (Consider location/radiation/quality/duration/timing/severity/associated sxs/prior treatment) Patient is a 79 y.o. male presenting with abscess. The history is provided by the patient.  Abscess Location:  Ano-genital Ano-genital abscess location:  Scrotum Size:  2 cm Abscess quality: draining, induration and painful   Red streaking: no   Duration:  1 day Progression:  Improving Pain details:    Quality:  Pressure and sharp   Severity:  Moderate   Timing:  Constant   Progression:  Improving Chronicity:  New Context: not diabetes and not skin injury   Relieved by:  Warm water soaks (He has been using an otc boil eeze product) Worsened by:  Nothing tried Ineffective treatments:  None tried Associated symptoms: no fever, no nausea and no vomiting     Past Medical History  Diagnosis Date  . Hypertension   . Acid reflux   . Prostate disease   . Ex-cigarette smoker     Past Surgical History  Procedure Laterality Date  . Prostate surgery    . Pacemaker insertion      No family history on file.  History  Substance Use Topics  . Smoking status: Former Smoker    Quit date: 07/20/2002  . Smokeless tobacco: Not on file  . Alcohol Use: No      Review of Systems  Constitutional: Negative for fever and chills.  HENT: Negative for facial swelling.   Respiratory: Negative for shortness of breath and wheezing.   Gastrointestinal: Negative for nausea and vomiting.  Skin: Negative for color change.       As noted in HPI  Neurological: Negative for numbness.    Allergies  Review of patient's allergies indicates no known allergies.  Home Medications   Current Outpatient Rx  Name  Route  Sig  Dispense  Refill  . AMLODIPINE BESYLATE PO   Oral   Take 10 mg by mouth daily.         Marland Kitchen  aspirin EC 81 MG EC tablet   Oral   Take 1 tablet (81 mg total) by mouth daily.         Marland Kitchen atorvastatin (LIPITOR) 20 MG tablet   Oral   Take 1 tablet (20 mg total) by mouth daily at 6 PM.   30 tablet   5   . cetirizine (ZYRTEC) 10 MG tablet   Oral   Take 10 mg by mouth daily.         . colchicine 0.6 MG tablet   Oral   Take 1 tablet (0.6 mg total) by mouth 2 (two) times daily.   60 tablet   0   . Cyanocobalamin (B-12) 1000 MCG CAPS   Oral   Take 1 tablet by mouth daily.         . furosemide (LASIX) 40 MG tablet   Oral   Take 40 mg by mouth daily.         Marland Kitchen HYDROcodone-acetaminophen (NORCO/VICODIN) 5-325 MG per tablet   Oral   Take 2 tablets by mouth 2 (two) times daily as needed for pain.          . Nebivolol HCl (BYSTOLIC) 20 MG TABS   Oral   Take 20 mg by mouth daily.         Marland Kitchen  niacin (NIASPAN) 500 MG CR tablet   Oral   Take 500 mg by mouth at bedtime.         Marland Kitchen omeprazole (PRILOSEC) 20 MG capsule   Oral   Take 20 mg by mouth daily.         . Saw Palmetto 450 MG CAPS   Oral   Take 450 mg by mouth daily.         . tamsulosin (FLOMAX) 0.4 MG CAPS   Oral   Take 0.4 mg by mouth daily.         . temazepam (RESTORIL) 15 MG capsule   Oral   Take 15 mg by mouth at bedtime as needed for sleep.           BP 167/70  Pulse 68  Temp(Src) 98.4 F (36.9 C) (Oral)  Resp 16  SpO2 98%  Physical Exam  Constitutional: He appears well-developed and well-nourished. No distress.  HENT:  Head: Normocephalic.  Neck: Neck supple.  Cardiovascular: Normal rate.   Pulmonary/Chest: Effort normal. He has no wheezes.  Musculoskeletal: Normal range of motion. He exhibits no edema.  Skin:  Small tender indurated abscess left scrotal wall which is draining a small amount of purulence.  No surrounding erythema.      ED Course  Procedures (including critical care time)  Labs Reviewed - No data to display No results found.   1. Abscess of scrotal  wall       MDM  Patient was also seen by Dr. Deretha Emory and agrees with plan.  Patient to continue warm compresses or soaks, doxycycline prescribed.  Patient was encouraged to followup with his PCP next week for recheck, also discussed that he may need to have this area lanced if it does not continue to improve which patient understands.  Advised to return here if he has worse pain, swelling.        Burgess Amor, PA-C 08/05/12 [phone]

## 2012-08-05 NOTE — ED Notes (Signed)
Abscess to left groin. Draining thick fluid. Patient denies fevers. States he was trying to get into see Dr Sudie Bailey, but was unable. Will assess further with PA.

## 2012-08-05 NOTE — ED Notes (Signed)
Patient with no complaints at this time. Respirations even and unlabored. Skin warm/dry. Discharge instructions reviewed with patient at this time. Patient given opportunity to voice concerns/ask questions. Patient discharged at this time and left Emergency Department with steady gait.   

## 2012-09-01 ENCOUNTER — Encounter: Payer: Self-pay | Admitting: Cardiovascular Disease

## 2012-09-01 ENCOUNTER — Other Ambulatory Visit: Payer: Self-pay | Admitting: Cardiovascular Disease

## 2012-09-01 ENCOUNTER — Ambulatory Visit (INDEPENDENT_AMBULATORY_CARE_PROVIDER_SITE_OTHER): Payer: Medicare Other | Admitting: Cardiovascular Disease

## 2012-09-01 VITALS — BP 150/82 | HR 60 | Resp 16 | Ht 73.0 in | Wt 195.1 lb

## 2012-09-01 DIAGNOSIS — I480 Paroxysmal atrial fibrillation: Secondary | ICD-10-CM

## 2012-09-01 DIAGNOSIS — I498 Other specified cardiac arrhythmias: Secondary | ICD-10-CM

## 2012-09-01 DIAGNOSIS — R001 Bradycardia, unspecified: Secondary | ICD-10-CM

## 2012-09-01 DIAGNOSIS — I10 Essential (primary) hypertension: Secondary | ICD-10-CM

## 2012-09-01 DIAGNOSIS — Z950 Presence of cardiac pacemaker: Secondary | ICD-10-CM

## 2012-09-01 DIAGNOSIS — I4891 Unspecified atrial fibrillation: Secondary | ICD-10-CM

## 2012-09-01 LAB — PACEMAKER DEVICE OBSERVATION

## 2012-09-01 NOTE — Patient Instructions (Addendum)
Increase Aspirin to 162mg  a day.  We will check your pacemaker remotely every 3 months.  You should receive a letter about the dates it will be checked.  Your physician recommends that you schedule a follow-up appointment in: One year.

## 2012-09-02 DIAGNOSIS — I10 Essential (primary) hypertension: Secondary | ICD-10-CM | POA: Insufficient documentation

## 2012-09-02 DIAGNOSIS — I480 Paroxysmal atrial fibrillation: Secondary | ICD-10-CM | POA: Insufficient documentation

## 2012-09-02 NOTE — Assessment & Plan Note (Signed)
Fair control.

## 2012-09-02 NOTE — Progress Notes (Signed)
Patient ID: Derrick Long, male   DOB: 22-Jul-1926, 79 y.o.   MRN: [id number]     Reason for office visit Followup after pacemaker implantation  Subglottal presented with symptomatic bradycardia D2 sinus node dysfunction with long sinus pauses. Received a [employer] dual-chamber pacemaker and has done well since then. Has not had any new episodes of dizziness or syncope. The device site has healed nicely. Had no fever chills or signs of local inflammation/infection.  Interrogation of his device today shows normal pacemaker function but shows several relatively brief episodes of paroxysmal atrial fibrillation which were asymptomatic. He does not have a history of stroke or transient ischemic attack. He has mild and well-controlled systemic hypertension. He does not have diabetes mellitus or congestive heart failure. His recent echocardiogram shows no signs of significant valvular heart disease and there is normal left ventricular systolic function.    No Known Allergies  Current Outpatient Prescriptions  Medication Sig Dispense Refill  . AMLODIPINE BESYLATE PO Take 10 mg by mouth daily.      Marland Kitchen aspirin EC 81 MG EC tablet Take 1 tablet (81 mg total) by mouth daily.      Marland Kitchen atorvastatin (LIPITOR) 20 MG tablet Take 1 tablet (20 mg total) by mouth daily at 6 PM.  30 tablet  5  . cetirizine (ZYRTEC) 10 MG tablet Take 10 mg by mouth daily.      . colchicine 0.6 MG tablet Take 1 tablet (0.6 mg total) by mouth 2 (two) times daily.  60 tablet  0  . Cyanocobalamin (B-12) 1000 MCG CAPS Take 1 tablet by mouth daily.      . furosemide (LASIX) 40 MG tablet Take 40 mg by mouth daily as needed.       . Nebivolol HCl (BYSTOLIC) 20 MG TABS Take 20 mg by mouth daily.      . niacin (NIASPAN) 500 MG CR tablet Take 500 mg by mouth at bedtime.      Marland Kitchen omeprazole (PRILOSEC) 20 MG capsule Take 20 mg by mouth daily.      . Saw Palmetto 450 MG CAPS Take 450 mg by mouth daily.      . tamsulosin (FLOMAX) 0.4 MG CAPS  Take 0.4 mg by mouth daily.      . temazepam (RESTORIL) 15 MG capsule Take 15 mg by mouth at bedtime as needed for sleep.       No current facility-administered medications for this visit.    Past Medical History  Diagnosis Date  . Hypertension   . Acid reflux   . Prostate disease   . Ex-cigarette smoker     Past Surgical History  Procedure Laterality Date  . Prostate surgery    . Pacemaker insertion      No family history on file.  History   Social History  . Marital Status: Married    Spouse Name: N/A    Number of Children: N/A  . Years of Education: N/A   Occupational History  . Not on file.   Social History Main Topics  . Smoking status: Former Smoker    Quit date: 07/20/2002  . Smokeless tobacco: Not on file  . Alcohol Use: No  . Drug Use: No  . Sexually Active: Not on file   Other Topics Concern  . Not on file   Social History Narrative  . No narrative on file    Review of systems: The patient specifically denies any chest pain at rest or with exertion,  dyspnea at rest or with exertion, orthopnea, paroxysmal nocturnal dyspnea, syncope, palpitations, focal neurological deficits, intermittent claudication, lower extremity edema, unexplained weight gain, cough, hemoptysis or wheezing.  The patient also denies abdominal pain, nausea, vomiting, dysphagia, diarrhea, constipation, polyuria, polydipsia, dysuria, hematuria, frequency, urgency, abnormal bleeding or bruising, fever, chills, unexpected weight changes, mood swings, change in skin or hair texture, change in voice quality, auditory or visual problems, allergic reactions or rashes, new musculoskeletal complaints other than usual "aches and pains".   PHYSICAL EXAM BP 150/82  Pulse 60  Resp 16  Ht 6\' 1"  (1.854 m)  Wt 195 lb 1.6 oz (88.497 kg)  BMI 25.75 kg/m2  General: Alert, oriented x3, no distress Head: no evidence of trauma, PERRL, EOMI, no exophtalmos or lid lag, no myxedema, no xanthelasma;  normal ears, nose and oropharynx Neck: normal jugular venous pulsations and no hepatojugular reflux; brisk carotid pulses without delay and no carotid bruits Chest: clear to auscultation, no signs of consolidation by percussion or palpation, normal fremitus, symmetrical and full respiratory excursions, healthy completely sealed surgical site without signs of redness drainage or swelling Cardiovascular: normal position and quality of the apical impulse, regular rhythm, normal first and second heart sounds, soft systolic ejection aortic murmurs, rubs or gallops Abdomen: no tenderness or distention, no masses by palpation, no abnormal pulsatility or arterial bruits, normal bowel sounds, no hepatosplenomegaly Extremities: no clubbing, cyanosis or edema; 2+ radial, ulnar and brachial pulses bilaterally; 2+ right femoral, posterior tibial and dorsalis pedis pulses; 2+ left femoral, posterior tibial and dorsalis pedis pulses; no subclavian or femoral bruits Neurological: grossly nonfocal   Lipid Panel     Component Value Date/Time   CHOL 137 09/15/2008 2030   TRIG 493* 09/15/2008 2030   HDL 22* 09/15/2008 2030   CHOLHDL 6.2 Ratio 09/15/2008 2030   VLDL NOT CALC mg/dL 1/61/0960 4540   LDLCALC See Comment mg/dL 9/81/1914 7829    BMET    Component Value Date/Time   NA 136 07/31/2012 0525   K 3.1* 07/31/2012 0525   CL 101 07/31/2012 0525   CO2 27 07/31/2012 0525   GLUCOSE 97 07/31/2012 0525   BUN 23 07/31/2012 0525   CREATININE 1.23 07/31/2012 0525   CALCIUM 8.9 07/31/2012 0525   GFRNONAA 52* 07/31/2012 0525   GFRAA 60* 07/31/2012 0525     ASSESSMENT AND PLAN Pacemaker The surgical site is completely healed. Device and lead parameters are all within the desirable range. Battery voltage is 2.98 V. Atrial lead impedance is 380 ohms. Sensed P waves 2.6 mV. Atrial threshold 1 V at 0.5 ms. Right ventricular lead impedance 510 ohms. R waves 9.6 mV. Right ventricular threshold 1 V 0.5 ms pulse with.  4  episodes of atrial tachycardia and atrial fibrillation are noted within overall burden of 3.3%, but the majority of these occurred immediately following pacemaker implantation, and the longest episode was less than 4 minutes in duration. The ventricular rates were normal. There is 86% atrial pacing and 95% ventricular pacing the device programmed DDDR.  He is enrolled in the [facility] remote pacemaker monitoring system  Chai Routh  Thurmon Fair, MD, [facility] [phone] office [phone] pager

## 2012-09-02 NOTE — Assessment & Plan Note (Signed)
The surgical site is completely healed. Device and lead parameters are all within the desirable range. Battery voltage is 2.98 V. Atrial lead impedance is 380 ohms. Sensed P waves 2.6 mV. Atrial threshold 1 V at 0.5 ms. Right ventricular lead impedance 510 ohms. R waves 9.6 mV. Right ventricular threshold 1 V 0.5 ms pulse with.  4 episodes of atrial tachycardia and atrial fibrillation are noted within overall burden of 3.3%, but the majority of these occurred immediately following pacemaker implantation, and the longest episode was less than 4 minutes in duration. The ventricular rates were normal. There is 86% atrial pacing and 95% ventricular pacing the device programmed DDDR.

## 2012-09-08 LAB — PACEMAKER DEVICE OBSERVATION
AL THRESHOLD: 1 V
ATRIAL PACING PM: 86
BAMS-0003: 70 {beats}/min
DEVICE MODEL PM: 7474449
RV LEAD THRESHOLD: 1 V
VENTRICULAR PACING PM: 95

## 2012-09-10 ENCOUNTER — Encounter: Payer: Self-pay | Admitting: Cardiovascular Disease

## 2012-12-04 LAB — PACEMAKER DEVICE OBSERVATION

## 2012-12-05 ENCOUNTER — Ambulatory Visit (INDEPENDENT_AMBULATORY_CARE_PROVIDER_SITE_OTHER): Payer: Medicare Other

## 2012-12-05 DIAGNOSIS — I498 Other specified cardiac arrhythmias: Secondary | ICD-10-CM

## 2012-12-05 DIAGNOSIS — R001 Bradycardia, unspecified: Secondary | ICD-10-CM

## 2012-12-05 DIAGNOSIS — I4891 Unspecified atrial fibrillation: Secondary | ICD-10-CM

## 2012-12-05 DIAGNOSIS — I480 Paroxysmal atrial fibrillation: Secondary | ICD-10-CM

## 2012-12-21 ENCOUNTER — Encounter: Payer: Self-pay | Admitting: *Deleted

## 2012-12-21 LAB — REMOTE PACEMAKER DEVICE
AL IMPEDENCE PM: 410 Ohm
ATRIAL PACING PM: 97
BAMS-0001: 150 {beats}/min
BAMS-0003: 70 {beats}/min
BATTERY VOLTAGE: 2.98 V
RV LEAD IMPEDENCE PM: 400 Ohm
VENTRICULAR PACING PM: 99

## 2013-02-03 ENCOUNTER — Ambulatory Visit (INDEPENDENT_AMBULATORY_CARE_PROVIDER_SITE_OTHER): Payer: Medicare Other

## 2013-02-03 DIAGNOSIS — I4891 Unspecified atrial fibrillation: Secondary | ICD-10-CM

## 2013-02-03 DIAGNOSIS — I480 Paroxysmal atrial fibrillation: Secondary | ICD-10-CM

## 2013-02-03 DIAGNOSIS — I498 Other specified cardiac arrhythmias: Secondary | ICD-10-CM

## 2013-02-03 DIAGNOSIS — R001 Bradycardia, unspecified: Secondary | ICD-10-CM

## 2013-02-03 LAB — MDC_IDC_ENUM_SESS_TYPE_REMOTE
Brady Statistic RA Percent Paced: 96 %
Brady Statistic RV Percent Paced: 99 %
Implantable Pulse Generator Serial Number: 7474449
Lead Channel Sensing Intrinsic Amplitude: 12 mV
Lead Channel Setting Pacing Amplitude: 2 V
Lead Channel Setting Pacing Amplitude: 2 V
Lead Channel Setting Pacing Pulse Width: 0.5 ms
Lead Channel Setting Sensing Sensitivity: 2 mV

## 2013-02-03 LAB — PACEMAKER DEVICE OBSERVATION

## 2013-03-05 LAB — PACEMAKER DEVICE OBSERVATION

## 2013-03-05 NOTE — Addendum Note (Signed)
Addended by: Shiela Mayer on: 03/05/2013 10:20 PM   Modules accepted: Level of Service

## 2013-03-07 ENCOUNTER — Ambulatory Visit (INDEPENDENT_AMBULATORY_CARE_PROVIDER_SITE_OTHER): Payer: Medicare Other | Admitting: *Deleted

## 2013-03-07 DIAGNOSIS — R001 Bradycardia, unspecified: Secondary | ICD-10-CM

## 2013-03-07 DIAGNOSIS — Z950 Presence of cardiac pacemaker: Secondary | ICD-10-CM

## 2013-03-07 DIAGNOSIS — I498 Other specified cardiac arrhythmias: Secondary | ICD-10-CM

## 2013-03-07 LAB — MDC_IDC_ENUM_SESS_TYPE_REMOTE
Brady Statistic AP VS Percent: 1 %
Brady Statistic AS VP Percent: 9.2 %
Brady Statistic RA Percent Paced: 90 %
Brady Statistic RV Percent Paced: 99 %
Date Time Interrogation Session: 20150118102644
Implantable Pulse Generator Serial Number: 7474449
Lead Channel Impedance Value: 410 Ohm
Lead Channel Pacing Threshold Amplitude: 1 V
Lead Channel Sensing Intrinsic Amplitude: 3 mV
Lead Channel Setting Pacing Pulse Width: 0.5 ms
Lead Channel Setting Sensing Sensitivity: 2 mV
MDC IDC MSMT BATTERY VOLTAGE: 2.99 V
MDC IDC MSMT LEADCHNL RA IMPEDANCE VALUE: 410 Ohm
MDC IDC MSMT LEADCHNL RA PACING THRESHOLD PULSEWIDTH: 0.5 ms
MDC IDC MSMT LEADCHNL RV PACING THRESHOLD AMPLITUDE: 1 V
MDC IDC MSMT LEADCHNL RV PACING THRESHOLD PULSEWIDTH: 0.5 ms
MDC IDC MSMT LEADCHNL RV SENSING INTR AMPL: 12 mV
MDC IDC SET LEADCHNL RA PACING AMPLITUDE: 2 V
MDC IDC SET LEADCHNL RV PACING AMPLITUDE: 2 V
MDC IDC STAT BRADY AP VP PERCENT: 91 %
MDC IDC STAT BRADY AS VS PERCENT: 1 %

## 2013-03-22 ENCOUNTER — Encounter: Payer: Self-pay | Admitting: *Deleted

## 2013-06-07 ENCOUNTER — Encounter: Payer: Self-pay | Admitting: Cardiovascular Disease

## 2013-06-07 ENCOUNTER — Ambulatory Visit (INDEPENDENT_AMBULATORY_CARE_PROVIDER_SITE_OTHER): Payer: Medicare Other | Admitting: *Deleted

## 2013-06-07 DIAGNOSIS — I498 Other specified cardiac arrhythmias: Secondary | ICD-10-CM

## 2013-06-07 DIAGNOSIS — R001 Bradycardia, unspecified: Secondary | ICD-10-CM

## 2013-06-07 LAB — MDC_IDC_ENUM_SESS_TYPE_REMOTE
Brady Statistic AP VS Percent: 1 %
Brady Statistic AS VP Percent: 44 %
Brady Statistic RA Percent Paced: 52 %
Brady Statistic RV Percent Paced: 98 %
Date Time Interrogation Session: 20150422065900
Implantable Pulse Generator Serial Number: 7474449
Lead Channel Impedance Value: 390 Ohm
Lead Channel Pacing Threshold Amplitude: 1.25 V
Lead Channel Sensing Intrinsic Amplitude: 2 mV
Lead Channel Setting Sensing Sensitivity: 2 mV
MDC IDC MSMT BATTERY REMAINING LONGEVITY: 91 mo
MDC IDC MSMT BATTERY VOLTAGE: 2.98 V
MDC IDC MSMT LEADCHNL RA IMPEDANCE VALUE: 340 Ohm
MDC IDC MSMT LEADCHNL RA PACING THRESHOLD PULSEWIDTH: 0.5 ms
MDC IDC MSMT LEADCHNL RV PACING THRESHOLD AMPLITUDE: 0.75 V
MDC IDC MSMT LEADCHNL RV PACING THRESHOLD PULSEWIDTH: 0.5 ms
MDC IDC MSMT LEADCHNL RV SENSING INTR AMPL: 12 mV
MDC IDC SET LEADCHNL RA PACING AMPLITUDE: 2 V
MDC IDC SET LEADCHNL RV PACING AMPLITUDE: 2 V
MDC IDC SET LEADCHNL RV PACING PULSEWIDTH: 0.5 ms
MDC IDC STAT BRADY AP VP PERCENT: 55 %
MDC IDC STAT BRADY AS VS PERCENT: 1 %

## 2013-06-15 ENCOUNTER — Encounter: Payer: Self-pay | Admitting: *Deleted

## 2013-06-20 NOTE — Progress Notes (Signed)
Pacemaker remote check. Device function reviewed. Impedance, sensing, auto capture thresholds consistent with previous measurements. Histograms appropriate for patient and level of activity. All other diagnostic data reviewed and is appropriate and stable for patient. Real time/magnet EGM shows appropriate sensing and capture. 165 mode switches the longest > 4 minutes for a total burden of 6.8%, - coumadin.  No ventricular high rate episodes. Estimated longevity ___. Plan to follow in 3 months remotely, to see in office annually.

## 2013-07-26 ENCOUNTER — Encounter (HOSPITAL_COMMUNITY): Payer: Self-pay | Admitting: Emergency Medicine

## 2013-07-26 ENCOUNTER — Emergency Department (HOSPITAL_COMMUNITY)
Admission: EM | Admit: 2013-07-26 | Discharge: 2013-07-26 | Disposition: A | Discharge status: Home / Self Care | Payer: Medicare Other | Attending: Emergency Medicine | Admitting: Emergency Medicine

## 2013-07-26 DIAGNOSIS — K219 Gastro-esophageal reflux disease without esophagitis: Secondary | ICD-10-CM | POA: Insufficient documentation

## 2013-07-26 DIAGNOSIS — Z7982 Long term (current) use of aspirin: Secondary | ICD-10-CM | POA: Insufficient documentation

## 2013-07-26 DIAGNOSIS — Z79899 Other long term (current) drug therapy: Secondary | ICD-10-CM | POA: Insufficient documentation

## 2013-07-26 DIAGNOSIS — I10 Essential (primary) hypertension: Secondary | ICD-10-CM | POA: Insufficient documentation

## 2013-07-26 DIAGNOSIS — R3915 Urgency of urination: Secondary | ICD-10-CM | POA: Insufficient documentation

## 2013-07-26 DIAGNOSIS — Z950 Presence of cardiac pacemaker: Secondary | ICD-10-CM | POA: Insufficient documentation

## 2013-07-26 DIAGNOSIS — R339 Retention of urine, unspecified: Secondary | ICD-10-CM

## 2013-07-26 DIAGNOSIS — R300 Dysuria: Secondary | ICD-10-CM | POA: Insufficient documentation

## 2013-07-26 DIAGNOSIS — Z87891 Personal history of nicotine dependence: Secondary | ICD-10-CM | POA: Insufficient documentation

## 2013-07-26 LAB — CBC WITH DIFFERENTIAL/PLATELET
Basophils Absolute: 0 10*3/uL (ref 0.0–0.1)
Basophils Relative: 1 % (ref 0–1)
EOS PCT: 2 % (ref 0–5)
Eosinophils Absolute: 0.1 10*3/uL (ref 0.0–0.7)
HEMATOCRIT: 39.9 % (ref 39.0–52.0)
Hemoglobin: 14 g/dL (ref 13.0–17.0)
LYMPHS ABS: 2.2 10*3/uL (ref 0.7–4.0)
Lymphocytes Relative: 40 % (ref 12–46)
MCH: 32.3 pg (ref 26.0–34.0)
MCHC: 35.1 g/dL (ref 30.0–36.0)
MCV: 91.9 fL (ref 78.0–100.0)
MONOS PCT: 8 % (ref 3–12)
Monocytes Absolute: 0.4 10*3/uL (ref 0.1–1.0)
Neutro Abs: 2.7 10*3/uL (ref 1.7–7.7)
Neutrophils Relative %: 49 % (ref 43–77)
Platelets: 205 10*3/uL (ref 150–400)
RBC: 4.34 MIL/uL (ref 4.22–5.81)
RDW: 13.1 % (ref 11.5–15.5)
WBC: 5.5 10*3/uL (ref 4.0–10.5)

## 2013-07-26 LAB — URINALYSIS, ROUTINE W REFLEX MICROSCOPIC
BILIRUBIN URINE: NEGATIVE
Glucose, UA: NEGATIVE mg/dL
KETONES UR: NEGATIVE mg/dL
LEUKOCYTES UA: NEGATIVE
NITRITE: NEGATIVE
UROBILINOGEN UA: 0.2 mg/dL (ref 0.0–1.0)
pH: 6 (ref 5.0–8.0)

## 2013-07-26 LAB — BASIC METABOLIC PANEL
BUN: 10 mg/dL (ref 6–23)
CALCIUM: 9.2 mg/dL (ref 8.4–10.5)
CO2: 29 mEq/L (ref 19–32)
Chloride: 100 mEq/L (ref 96–112)
Creatinine, Ser: 1.07 mg/dL (ref 0.50–1.35)
GFR calc Af Amer: 71 mL/min — ABNORMAL LOW (ref >90)
GFR, EST NON AFRICAN AMERICAN: 61 mL/min — AB (ref >90)
GLUCOSE: 100 mg/dL — AB (ref 70–99)
Potassium: 3 mEq/L — ABNORMAL LOW (ref 3.7–5.3)
Sodium: 142 mEq/L (ref 137–147)

## 2013-07-26 LAB — URINE MICROSCOPIC-ADD ON

## 2013-07-26 MED ORDER — POTASSIUM CHLORIDE CRYS ER 20 MEQ PO TBCR
40.0000 meq | EXTENDED_RELEASE_TABLET | Freq: Once | ORAL | Status: AC
  Administered 2013-07-26: 40 meq via ORAL
  Filled 2013-07-26: qty 2

## 2013-07-26 NOTE — ED Notes (Signed)
Pt urinated around 50-60 cc amber urine.

## 2013-07-26 NOTE — ED Notes (Signed)
600cc urine, leg bag attached will follow up with Urology

## 2013-07-26 NOTE — ED Provider Notes (Signed)
CSN: [id number]     Arrival date & time 07/26/13  1150 History   This chart was scribed for Maudry Diego, MD by Jeanell Sparrow, ED Scribe. This patient was seen in room APA18/APA18 and the patient's care was started at 1:37 PM.  Chief Complaint  Patient presents with  . Urinary Retention    Patient is a 80 y.o. male presenting with frequency. The history is provided by the patient. No language interpreter was used.  Urinary Frequency This is a new problem. The current episode started 6 to 12 hours ago. The problem occurs constantly. The problem has not changed since onset.Pertinent negatives include no chest pain, no abdominal pain and no headaches. Nothing aggravates the symptoms. Nothing relieves the symptoms. He has tried nothing for the symptoms.   HPI Comments: Derrick Long is a 80 y.o. male with a hx of prostate CA who presents to the Emergency Department complaining of urinary retention that started last night. He states that he was able to urinate a small amount. He states there is burning while he urinates. He states that he feels full and feels some urinary urgency.   Past Medical History  Diagnosis Date  . Hypertension   . Acid reflux   . Prostate disease   . Ex-cigarette smoker    Past Surgical History  Procedure Laterality Date  . Prostate surgery    . Pacemaker insertion     History reviewed. No pertinent family history. History  Substance Use Topics  . Smoking status: Former Smoker    Quit date: 07/20/2002  . Smokeless tobacco: Not on file  . Alcohol Use: No    Review of Systems  Constitutional: Negative for appetite change and fatigue.  HENT: Negative for congestion, ear discharge and sinus pressure.   Eyes: Negative for discharge.  Respiratory: Negative for cough.   Cardiovascular: Negative for chest pain.  Gastrointestinal: Negative for abdominal pain and diarrhea.  Genitourinary: Positive for dysuria, urgency, frequency and decreased urine volume.  Negative for hematuria.  Musculoskeletal: Negative for back pain.  Skin: Negative for rash.  Neurological: Negative for seizures and headaches.  Psychiatric/Behavioral: Negative for hallucinations.      Allergies  Review of patient's allergies indicates no known allergies.  Home Medications   Prior to Admission medications   Medication Sig Start Date End Date Taking? Authorizing Provider  aspirin EC 81 MG tablet Take 81 mg by mouth 2 (two) times daily.   Yes Historical Provider, MD  atorvastatin (LIPITOR) 20 MG tablet Take 1 tablet (20 mg total) by mouth daily at 6 PM. 07/23/12  Yes Brittainy Rosita Fire, PA-C  cetirizine (ZYRTEC) 10 MG tablet Take 10 mg by mouth daily.   Yes Historical Provider, MD  Cyanocobalamin (B-12) 1000 MCG CAPS Take 1 tablet by mouth daily.   Yes Historical Provider, MD  furosemide (LASIX) 40 MG tablet Take 40 mg by mouth daily.    Yes Historical Provider, MD  Nebivolol HCl (BYSTOLIC) 20 MG TABS Take 20 mg by mouth daily.   Yes Historical Provider, MD  niacin (NIASPAN) 500 MG CR tablet Take 500 mg by mouth at bedtime.   Yes Historical Provider, MD  omeprazole (PRILOSEC) 20 MG capsule Take 20 mg by mouth daily.   Yes Historical Provider, MD  Saw Palmetto 450 MG CAPS Take 450 mg by mouth daily.   Yes Historical Provider, MD  tamsulosin (FLOMAX) 0.4 MG CAPS Take 0.4 mg by mouth daily.   Yes Historical Provider, MD  temazepam (  RESTORIL) 15 MG capsule Take 15 mg by mouth at bedtime as needed for sleep.   Yes Historical Provider, MD  AMLODIPINE BESYLATE PO Take 1 tablet by mouth daily.     Historical Provider, MD  colchicine 0.6 MG tablet Take 0.6 mg by mouth 2 (two) times daily as needed (gout).    Historical Provider, MD   BP 169/96  Pulse 86  Temp(Src) 97.7 F (36.5 C) (Oral)  Resp 18  Ht 6\' 1"  (1.854 m)  Wt 210 lb (95.255 kg)  BMI 27.71 kg/m2 Physical Exam  Nursing note and vitals reviewed. Constitutional: He is oriented to person, place, and time. He appears  well-developed.  HENT:  Head: Normocephalic.  Eyes: Conjunctivae and EOM are normal. No scleral icterus.  Neck: Neck supple. No thyromegaly present.  Cardiovascular: Normal rate and regular rhythm.  Exam reveals no gallop and no friction rub.   No murmur heard. Pulmonary/Chest: No stridor. He has no wheezes. He has no rales. He exhibits no tenderness.  Abdominal: He exhibits no distension. There is no tenderness. There is no rebound.  Musculoskeletal: Normal range of motion. He exhibits tenderness. He exhibits no edema.  Mildly tender in suprapubic area.  Lymphadenopathy:    He has no cervical adenopathy.  Neurological: He is oriented to person, place, and time. He exhibits normal muscle tone. Coordination normal.  Skin: No rash noted. No erythema.  Psychiatric: He has a normal mood and affect. His behavior is normal.    ED Course  Procedures (including critical care time) DIAGNOSTIC STUDIES:   COORDINATION OF CARE: 1:39 PM- Pt advised of plan for treatment which includes labs and a catheter insert and pt agrees.   Labs Review Labs Reviewed  URINALYSIS, ROUTINE W REFLEX MICROSCOPIC - Abnormal; Notable for the following:    Specific Gravity, Urine <1.005 (*)    Hgb urine dipstick TRACE (*)    Protein, ur TRACE (*)    All other components within normal limits  URINE MICROSCOPIC-ADD ON    Imaging Review No results found.   EKG Interpretation None      MDM   Final diagnoses:  None   The chart was scribed for me under my direct supervision.  I personally performed the history, physical, and medical decision making and all procedures in the evaluation of this patient.Maudry Diego, MD 07/26/13 [phone]

## 2013-07-26 NOTE — Discharge Instructions (Signed)
Follow up with Dr. Michela Pitcher next week

## 2013-07-26 NOTE — ED Notes (Signed)
Bladder scan performed.results: 650-700cc

## 2013-07-26 NOTE — ED Notes (Signed)
Pt c/o urinary retention today, did states able to urinate small amount about 30 min., pt with hx of prostate CA

## 2013-07-29 ENCOUNTER — Encounter (HOSPITAL_COMMUNITY): Payer: Self-pay | Admitting: Emergency Medicine

## 2013-07-29 ENCOUNTER — Emergency Department (HOSPITAL_COMMUNITY)
Admission: EM | Admit: 2013-07-29 | Discharge: 2013-07-29 | Disposition: A | Discharge status: Home / Self Care | Payer: Medicare Other | Attending: Emergency Medicine | Admitting: Emergency Medicine

## 2013-07-29 ENCOUNTER — Emergency Department (HOSPITAL_COMMUNITY)
Admission: EM | Admit: 2013-07-29 | Discharge: 2013-07-29 | Disposition: A | Discharge status: Home / Self Care | Payer: Medicare Other | Source: Home / Self Care | Attending: Emergency Medicine | Admitting: Emergency Medicine

## 2013-07-29 DIAGNOSIS — Z9079 Acquired absence of other genital organ(s): Secondary | ICD-10-CM | POA: Insufficient documentation

## 2013-07-29 DIAGNOSIS — Z7982 Long term (current) use of aspirin: Secondary | ICD-10-CM | POA: Insufficient documentation

## 2013-07-29 DIAGNOSIS — Z950 Presence of cardiac pacemaker: Secondary | ICD-10-CM | POA: Insufficient documentation

## 2013-07-29 DIAGNOSIS — K219 Gastro-esophageal reflux disease without esophagitis: Secondary | ICD-10-CM | POA: Insufficient documentation

## 2013-07-29 DIAGNOSIS — T839XXA Unspecified complication of genitourinary prosthetic device, implant and graft, initial encounter: Secondary | ICD-10-CM

## 2013-07-29 DIAGNOSIS — I10 Essential (primary) hypertension: Secondary | ICD-10-CM | POA: Insufficient documentation

## 2013-07-29 DIAGNOSIS — Z87448 Personal history of other diseases of urinary system: Secondary | ICD-10-CM

## 2013-07-29 DIAGNOSIS — Z87891 Personal history of nicotine dependence: Secondary | ICD-10-CM | POA: Insufficient documentation

## 2013-07-29 DIAGNOSIS — Z79899 Other long term (current) drug therapy: Secondary | ICD-10-CM | POA: Insufficient documentation

## 2013-07-29 DIAGNOSIS — Y846 Urinary catheterization as the cause of abnormal reaction of the patient, or of later complication, without mention of misadventure at the time of the procedure: Secondary | ICD-10-CM | POA: Insufficient documentation

## 2013-07-29 DIAGNOSIS — T83091A Other mechanical complication of indwelling urethral catheter, initial encounter: Secondary | ICD-10-CM | POA: Insufficient documentation

## 2013-07-29 LAB — URINALYSIS, ROUTINE W REFLEX MICROSCOPIC
Bilirubin Urine: NEGATIVE
GLUCOSE, UA: NEGATIVE mg/dL
Ketones, ur: NEGATIVE mg/dL
Nitrite: NEGATIVE
PROTEIN: 30 mg/dL — AB
Specific Gravity, Urine: 1.015 (ref 1.005–1.030)
UROBILINOGEN UA: 1 mg/dL (ref 0.0–1.0)
pH: 6 (ref 5.0–8.0)

## 2013-07-29 LAB — URINE MICROSCOPIC-ADD ON

## 2013-07-29 NOTE — ED Provider Notes (Signed)
CSN: [id number]     Arrival date & time 07/29/13  1914 History  This chart was scribed for Sharyon Cable, MD by Roe Coombs, ED Scribe. The patient was seen in room APA10/APA10. Patient's care was started at 7:29 PM.  Chief Complaint  Patient presents with  . Urinary Frequency    The history is provided by the patient. No language interpreter was used.    HPI Comments: Derrick Long is a 80 y.o. male who presents to the Emergency Department after being seen here earlier today by me presenting with Foley catheter complications. He had Foley placed on 07/26/13 for urinary retention and stated that he noticed leaking today. ED nursing staff re-inflated balloon which appeared deflated upon exam - leaking ceased after monitoring in the ED and patient was discharged. Currently, patient reports that after he returned home, Foley catheter started leaking again. He states that he notices leaking when he develops the sensation of urinary urgency. Patient plans to see a urologist in [place] on Monday, 07/31/13. He denies fever, abdominal pain, or vomiting.    Past Medical History  Diagnosis Date  . Hypertension   . Acid reflux   . Prostate disease   . Ex-cigarette smoker    Past Surgical History  Procedure Laterality Date  . Prostate surgery    . Pacemaker insertion     No family history on file. History  Substance Use Topics  . Smoking status: Former Smoker    Quit date: 07/20/2002  . Smokeless tobacco: Not on file  . Alcohol Use: No    Review of Systems  Constitutional: Negative for fever.  Gastrointestinal: Negative for vomiting and abdominal pain.     Allergies  Review of patient's allergies indicates no known allergies.  Home Medications   Prior to Admission medications   Medication Sig Start Date End Date Taking? Authorizing Provider  amLODipine (NORVASC) 10 MG tablet Take 10 mg by mouth every morning.     Historical Provider, MD  aspirin EC 81 MG tablet Take 81  mg by mouth 2 (two) times daily.    Historical Provider, MD  atorvastatin (LIPITOR) 20 MG tablet Take 1 tablet (20 mg total) by mouth daily at 6 PM. 07/23/12   Brittainy Simmons, PA-C  cetirizine (ZYRTEC) 10 MG tablet Take 10 mg by mouth at bedtime.     Historical Provider, MD  colchicine 0.6 MG tablet Take 0.6 mg by mouth 2 (two) times daily as needed (gout).    Historical Provider, MD  Cyanocobalamin (B-12) 1000 MCG CAPS Take 1 tablet by mouth every morning.     Historical Provider, MD  furosemide (LASIX) 40 MG tablet Take 40 mg by mouth every morning.     Historical Provider, MD  Nebivolol HCl (BYSTOLIC) 20 MG TABS Take 20 mg by mouth every morning.     Historical Provider, MD  niacin (NIASPAN) 500 MG CR tablet Take 500 mg by mouth at bedtime.    Historical Provider, MD  omeprazole (PRILOSEC) 20 MG capsule Take 20 mg by mouth every morning.     Historical Provider, MD  Saw Palmetto 450 MG CAPS Take 450 mg by mouth at bedtime.     Historical Provider, MD  tamsulosin (FLOMAX) 0.4 MG CAPS Take 0.4 mg by mouth every morning.     Historical Provider, MD  temazepam (RESTORIL) 15 MG capsule Take 15 mg by mouth at bedtime as needed for sleep.    Historical Provider, MD   Triage Vitals: BP  156/73  Pulse 77  Temp(Src) 98.1 F (36.7 C) (Oral)  Resp 22  Ht 6\' 1"  (1.854 m)  Wt 202 lb (91.627 kg)  BMI 26.66 kg/m2  SpO2 96% Physical Exam CONSTITUTIONAL: Well developed/well nourished HEAD: Normocephalic/atraumatic EYES: EOMI ENMT: Mucous membranes moist NECK: supple no meningeal signs NEURO: Pt is awake/alert, moves all extremitiesx4 SKIN: warm, color normal PSYCH: no abnormalities of mood noted  ED Course  Procedures  DIAGNOSTIC STUDIES: Oxygen Saturation is 96% on room air, normal by my interpretation.    COORDINATION OF CARE: 7:32 PM- Patient informed of current plan for treatment and evaluation and agrees with plan at this time.  Plan to replace foley 8:58 PM Foley replaced by  nursing Pt tolerated well and feels improved. There is no leaking at this time He would like to go home.  I offered to monitor him longer to ensure no further leaking, but he feels well and would like to go home He will see urology this coming week  MDM   Final diagnoses:  Complication of Foley catheter    Nursing notes including past medical history and social history reviewed and considered in documentation   I personally performed the services described in this documentation, which was scribed in my presence. The recorded information has been reviewed and is accurate.     Sharyon Cable, MD 07/29/13 2059

## 2013-07-29 NOTE — ED Notes (Signed)
Patient is resting comfortably. 

## 2013-07-29 NOTE — ED Notes (Signed)
7.5ML of fluid in balloon. Balloon re inflated with 10ML of NS. Patient given water to drink, will reassess foley shortly for any leaking.

## 2013-07-29 NOTE — ED Notes (Signed)
Patient reports seen in ED today and had foley catheter placed. Reports catheter is leaking.

## 2013-07-29 NOTE — ED Provider Notes (Signed)
CSN: [id number]     Arrival date & time 07/29/13  1520 History   First MD Initiated Contact with Patient 07/29/13 1602     Chief Complaint  Patient presents with  . Urinary Frequency      HPI Patient presents for foley catheter complications He had foley placed on 6/10 for urinary retention He reports today he noticed it is leaking around the foley He has no other complaints No abd pain/fever   Nothing improves symptoms.   He has no other complaints Past Medical History  Diagnosis Date  . Hypertension   . Acid reflux   . Prostate disease   . Ex-cigarette smoker    Past Surgical History  Procedure Laterality Date  . Prostate surgery    . Pacemaker insertion     History reviewed. No pertinent family history. History  Substance Use Topics  . Smoking status: Former Smoker    Quit date: 07/20/2002  . Smokeless tobacco: Not on file  . Alcohol Use: No    Review of Systems  Constitutional: Negative for fever.  Gastrointestinal: Negative for abdominal pain.      Allergies  Review of patient's allergies indicates no known allergies.  Home Medications   Prior to Admission medications   Medication Sig Start Date End Date Taking? Authorizing Provider  amLODipine (NORVASC) 10 MG tablet Take 10 mg by mouth daily.    Historical Provider, MD  aspirin EC 81 MG tablet Take 81 mg by mouth 2 (two) times daily.    Historical Provider, MD  atorvastatin (LIPITOR) 20 MG tablet Take 1 tablet (20 mg total) by mouth daily at 6 PM. 07/23/12   Brittainy Simmons, PA-C  cetirizine (ZYRTEC) 10 MG tablet Take 10 mg by mouth daily.    Historical Provider, MD  colchicine 0.6 MG tablet Take 0.6 mg by mouth 2 (two) times daily as needed (gout).    Historical Provider, MD  Cyanocobalamin (B-12) 1000 MCG CAPS Take 1 tablet by mouth daily.    Historical Provider, MD  furosemide (LASIX) 40 MG tablet Take 40 mg by mouth daily.     Historical Provider, MD  Nebivolol HCl (BYSTOLIC) 20 MG TABS Take 20 mg  by mouth daily.    Historical Provider, MD  niacin (NIASPAN) 500 MG CR tablet Take 500 mg by mouth at bedtime.    Historical Provider, MD  omeprazole (PRILOSEC) 20 MG capsule Take 20 mg by mouth daily.    Historical Provider, MD  Saw Palmetto 450 MG CAPS Take 450 mg by mouth daily.    Historical Provider, MD  tamsulosin (FLOMAX) 0.4 MG CAPS Take 0.4 mg by mouth daily.    Historical Provider, MD  temazepam (RESTORIL) 15 MG capsule Take 15 mg by mouth at bedtime as needed for sleep.    Historical Provider, MD   BP 170/69  Pulse 79  Temp(Src) 98 F (36.7 C) (Oral)  Resp 18  Ht 6' (1.829 m)  Wt 202 lb (91.627 kg)  BMI 27.39 kg/m2  SpO2 97% Physical Exam CONSTITUTIONAL: Well developed/well nourished HEAD: Normocephalic/atraumatic EYES: EOMI/PERRL ENMT: Mucous membranes moist NECK: supple no meningeal signs CV: S1/S2 noted, no murmurs/rubs/gallops noted LUNGS: Lungs are clear to auscultation bilaterally, no apparent distress ABDOMEN: soft, nontender, no rebound or guarding GU:no cva tenderness Foley in place, appears appropriate.  There is no leaking noted around catheter.  There is yellow urine in bag.   NEURO: Pt is awake/alert, moves all extremitiesx4 EXTREMITIES: pulses normal, full ROM SKIN: warm, color  normal PSYCH: no abnormalities of mood noted  ED Course  Procedures   Foley balloon appeared deflated, nurse inflated balloon and pt tolerated well  5:02 PM No leaking after monitoring in the ED  MDM   Final diagnoses:  Complication of Foley catheter    Nursing notes including past medical history and social history reviewed and considered in documentation Previous records reviewed and considered     Sharyon Cable, MD 07/29/13 1702

## 2013-07-29 NOTE — ED Notes (Addendum)
Ambulated patient around the nurses station with no problems. Made doctor aware of outcome.

## 2013-07-29 NOTE — ED Notes (Signed)
Leaking around foley catheter.

## 2013-07-31 ENCOUNTER — Other Ambulatory Visit (HOSPITAL_COMMUNITY): Payer: Self-pay | Admitting: Urology

## 2013-07-31 DIAGNOSIS — R338 Other retention of urine: Secondary | ICD-10-CM

## 2013-07-31 LAB — URINE CULTURE: Colony Count: 70000

## 2013-08-01 ENCOUNTER — Telehealth (HOSPITAL_BASED_OUTPATIENT_CLINIC_OR_DEPARTMENT_OTHER): Payer: Self-pay | Admitting: Emergency Medicine

## 2013-08-01 ENCOUNTER — Ambulatory Visit (HOSPITAL_COMMUNITY)
Admission: RE | Admit: 2013-08-01 | Discharge: 2013-08-01 | Disposition: A | Discharge status: Home / Self Care | Payer: Medicare Other | Source: Ambulatory Visit | Attending: Urology | Admitting: Urology

## 2013-08-01 DIAGNOSIS — R339 Retention of urine, unspecified: Secondary | ICD-10-CM | POA: Insufficient documentation

## 2013-08-01 DIAGNOSIS — R338 Other retention of urine: Secondary | ICD-10-CM

## 2013-08-01 DIAGNOSIS — N200 Calculus of kidney: Secondary | ICD-10-CM | POA: Insufficient documentation

## 2013-08-01 NOTE — Telephone Encounter (Signed)
Post ED Visit - Positive Culture Follow-up  Culture report reviewed by antimicrobial stewardship pharmacist: []  [place], Pharm.D., BCPS [x]  Heide Guile, Pharm.D., BCPS []  Alycia Rossetti, Pharm.D., BCPS []  [place], Pharm.D., BCPS, AAHIVP []  Legrand [place]o, Pharm.D., BCPS, AAHIVP []  Juliene Pina, Pharm.D.  Positive urine culture Per Montine Circle PA-C, no treatment needed and no further patient follow-up is required at this time.  [place], Rex Kras 08/01/2013, 2:16 PM

## 2013-08-03 ENCOUNTER — Other Ambulatory Visit (HOSPITAL_COMMUNITY): Payer: Self-pay | Admitting: Urology

## 2013-08-03 DIAGNOSIS — C61 Malignant neoplasm of prostate: Secondary | ICD-10-CM

## 2013-08-08 DIAGNOSIS — C61 Malignant neoplasm of prostate: Secondary | ICD-10-CM

## 2013-08-08 HISTORY — DX: Malignant neoplasm of prostate: C61

## 2013-08-09 ENCOUNTER — Ambulatory Visit (HOSPITAL_COMMUNITY)
Admission: RE | Admit: 2013-08-09 | Discharge: 2013-08-09 | Disposition: A | Discharge status: Home / Self Care | Payer: Medicare Other | Source: Ambulatory Visit | Attending: Urology | Admitting: Urology

## 2013-08-09 ENCOUNTER — Encounter (HOSPITAL_COMMUNITY): Payer: Self-pay

## 2013-08-09 DIAGNOSIS — Z9049 Acquired absence of other specified parts of digestive tract: Secondary | ICD-10-CM | POA: Insufficient documentation

## 2013-08-09 DIAGNOSIS — C61 Malignant neoplasm of prostate: Secondary | ICD-10-CM

## 2013-08-09 DIAGNOSIS — N289 Disorder of kidney and ureter, unspecified: Secondary | ICD-10-CM | POA: Insufficient documentation

## 2013-08-09 HISTORY — DX: Malignant neoplasm of prostate: C61

## 2013-08-09 LAB — POCT I-STAT CREATININE: Creatinine, Ser: 1.3 mg/dL (ref 0.50–1.35)

## 2013-08-09 MED ORDER — IOHEXOL 300 MG/ML  SOLN
125.0000 mL | Freq: Once | INTRAMUSCULAR | Status: AC | PRN
  Administered 2013-08-09: 100 mL via INTRAVENOUS

## 2013-09-11 ENCOUNTER — Ambulatory Visit (INDEPENDENT_AMBULATORY_CARE_PROVIDER_SITE_OTHER): Payer: Medicare Other | Admitting: Cardiovascular Disease

## 2013-09-11 ENCOUNTER — Encounter: Payer: Self-pay | Admitting: Cardiovascular Disease

## 2013-09-11 VITALS — BP 130/70 | HR 70 | Resp 16 | Ht 72.0 in | Wt 204.7 lb

## 2013-09-11 DIAGNOSIS — E785 Hyperlipidemia, unspecified: Secondary | ICD-10-CM

## 2013-09-11 DIAGNOSIS — I10 Essential (primary) hypertension: Secondary | ICD-10-CM

## 2013-09-11 DIAGNOSIS — I498 Other specified cardiac arrhythmias: Secondary | ICD-10-CM

## 2013-09-11 DIAGNOSIS — R001 Bradycardia, unspecified: Secondary | ICD-10-CM

## 2013-09-11 DIAGNOSIS — I4891 Unspecified atrial fibrillation: Secondary | ICD-10-CM

## 2013-09-11 DIAGNOSIS — Z950 Presence of cardiac pacemaker: Secondary | ICD-10-CM

## 2013-09-11 DIAGNOSIS — I480 Paroxysmal atrial fibrillation: Secondary | ICD-10-CM

## 2013-09-11 LAB — MDC_IDC_ENUM_SESS_TYPE_INCLINIC
Battery Remaining Longevity: 102 mo
Battery Voltage: 2.98 V
Brady Statistic RV Percent Paced: 98 %
Date Time Interrogation Session: 20150727083735
Implantable Pulse Generator Model: 2210
Lead Channel Impedance Value: 412.5 Ohm
Lead Channel Pacing Threshold Pulse Width: 0.5 ms
Lead Channel Sensing Intrinsic Amplitude: 0.6 mV
Lead Channel Sensing Intrinsic Amplitude: 12 mV
Lead Channel Setting Pacing Amplitude: 2 V
Lead Channel Setting Pacing Pulse Width: 0.5 ms
Lead Channel Setting Sensing Sensitivity: 2 mV
MDC IDC MSMT LEADCHNL RA IMPEDANCE VALUE: 362.5 Ohm
MDC IDC MSMT LEADCHNL RV PACING THRESHOLD AMPLITUDE: 0.75 V
MDC IDC MSMT LEADCHNL RV PACING THRESHOLD AMPLITUDE: 0.75 V
MDC IDC MSMT LEADCHNL RV PACING THRESHOLD PULSEWIDTH: 0.5 ms
MDC IDC PG SERIAL: 7474449
MDC IDC SET LEADCHNL RA PACING AMPLITUDE: 2 V
MDC IDC STAT BRADY RA PERCENT PACED: 54 %

## 2013-09-11 LAB — PACEMAKER DEVICE OBSERVATION

## 2013-09-11 MED ORDER — RIVAROXABAN 20 MG PO TABS: 20.0000 mg | ORAL_TABLET | Freq: Every day | ORAL | Status: DC

## 2013-09-11 NOTE — Assessment & Plan Note (Signed)
Well controlled 

## 2013-09-11 NOTE — Assessment & Plan Note (Signed)
He will get a copy of his labs from the [facility] for [place]

## 2013-09-11 NOTE — Assessment & Plan Note (Signed)
Asymptomatic. Antiarrhythmics are not indicated. CHADSVasc score is elevated (age, HTN) and he meets criteria for full anticoagulation. We discussed the relative pros and cons of warfarin versus multiple anticoagulants. I gave him a prescription for Xarelto and he will try to get it filled through the [facility] hospital.

## 2013-09-11 NOTE — Assessment & Plan Note (Signed)
Normal device function. Continue remote monitoring via Merlin and yearly office visits. He has a very high percentage of ventricular pacing, possibly due to AV node conduction disease. Turn VIP on.

## 2013-09-11 NOTE — Patient Instructions (Addendum)
Remote monitoring is used to monitor your pacemaker from home. This monitoring reduces the number of office visits required to check your device to one time per year. It allows [place] to keep an eye on the functioning of your device to ensure it is working properly. You are scheduled for a device check from home on 12-13-2013. You may send your transmission at any time that day. If you have a wireless device, the transmission will be sent automatically. After your physician reviews your transmission, you will receive a postcard with your next transmission date.  Your physician wants you to follow-up in: [place] will receive a reminder letter in the mail two months in advance. If you don't receive a letter, please call our office to schedule the follow-up appointment.   START XARELTO 20 MG ONCE DAILY WITH EVENING MEAL

## 2013-09-11 NOTE — Progress Notes (Signed)
Patient ID: Derrick Long, male   DOB: [birth year], 80 y.o.   MRN: [id number]      Reason for office visit Recurrent paroxysmal atrial fibrillation, sinus node dysfunction status post pacemaker, hypertension, hyperlipidemia  Derrick Long is now roughly one year status post implantation of a dual-chamber permanent pacemaker ([employer]) due to sinus pauses and is here for routine followup. His device is recorded to use the brief episodes of paroxysmal atrial fibrillation lasting for only a few minutes but over the last couple of months the burden of atrial fibrillation has increased substantially. Derrick Long is now having episodes that have lasted for hours at a time. These are uniformly asymptomatic. Derrick Long is actually in atrial fibrillation this morning (the episode started about 4 hours ago) and is completely unaware of it.  Derrick Long does not have any neurological complications and denies bleeding problems. Derrick Long has a remote history of colon cancer status post surgery. Derrick Long has not had problems with falls.  Derrick Long denies any cardiac problems. Derrick Long was recently evaluated for urinary retention but there is no plan for Derrick Long to undergo cystoscopy or surgery.  Other than the presence of atrial fibrillation device interrogation shows normal function. Estimated battery longevity is 8-9 years. Lead parameters are excellent. There is 54% atrial pacing and 98% ventricular pacing. The overall burden of atrial fibrillation S3 percent with the longest episode lasting for over 14 hours. Rapid ventricular response is rarely an issue.  No Known Allergies  Current Outpatient Prescriptions  Medication Sig Dispense Refill  . amLODipine (NORVASC) 10 MG tablet Take 10 mg by mouth every morning.       Marland Kitchen aspirin EC 81 MG tablet Take 81 mg by mouth 2 (two) times daily.      Marland Kitchen atorvastatin (LIPITOR) 20 MG tablet Take 1 tablet (20 mg total) by mouth daily at 6 PM.  30 tablet  5  . cetirizine (ZYRTEC) 10 MG tablet Take 10 mg by mouth at bedtime.        . colchicine 0.6 MG tablet Take 0.6 mg by mouth 2 (two) times daily as needed (gout).      . Cyanocobalamin (B-12) 1000 MCG CAPS Take 1 tablet by mouth every morning.       . furosemide (LASIX) 40 MG tablet Take 40 mg by mouth every morning.       . Nebivolol HCl (BYSTOLIC) 20 MG TABS Take 20 mg by mouth every morning.       . niacin (NIASPAN) 500 MG CR tablet Take 500 mg by mouth at bedtime.      Marland Kitchen omeprazole (PRILOSEC) 20 MG capsule Take 20 mg by mouth every morning.       . Saw Palmetto 450 MG CAPS Take 450 mg by mouth at bedtime.       . tamsulosin (FLOMAX) 0.4 MG CAPS Take 0.4 mg by mouth every morning.       . temazepam (RESTORIL) 15 MG capsule Take 15 mg by mouth at bedtime as needed for sleep.      . rivaroxaban (XARELTO) 20 MG TABS tablet Take 1 tablet (20 mg total) by mouth daily with supper.  90 tablet  3   No current facility-administered medications for this visit.    Past Medical History  Diagnosis Date  . Hypertension   . Acid reflux   . Prostate disease   . Ex-cigarette smoker   . Prostate cancer 6.23.2015    SEEDS 20 YEARS AGO    Past Surgical  History  Procedure Laterality Date  . Prostate surgery    . Pacemaker insertion      No family history on file.  History   Social History  . Marital Status: Married    Spouse Name: N/A    Number of Children: N/A  . Years of Education: N/A   Occupational History  . Not on file.   Social History Main Topics  . Smoking status: Former Smoker    Quit date: 07/20/2002  . Smokeless tobacco: Not on file  . Alcohol Use: No  . Drug Use: No  . Sexual Activity: Not on file   Other Topics Concern  . Not on file   Social History Narrative  . No narrative on file    Review of systems: The patient specifically denies any chest pain at rest or with exertion, dyspnea at rest or with exertion, orthopnea, paroxysmal nocturnal dyspnea, syncope, palpitations, focal neurological deficits, intermittent claudication, lower  extremity edema, unexplained weight gain, cough, hemoptysis or wheezing.  The patient also denies abdominal pain, nausea, vomiting, dysphagia, diarrhea, constipation, polyuria, polydipsia, dysuria, hematuria, frequency, urgency, abnormal bleeding or bruising, fever, chills, unexpected weight changes, mood swings, change in skin or hair texture, change in voice quality, auditory or visual problems, allergic reactions or rashes, new musculoskeletal complaints other than usual "aches and pains".   PHYSICAL EXAM BP 130/70  Pulse 70  Resp 16  Ht 6' (1.829 m)  Wt 204 lb 11.2 oz (92.851 kg)  BMI 27.76 kg/m2  General: Alert, oriented x3, no distress Head: no evidence of trauma, PERRL, EOMI, no exophtalmos or lid lag, no myxedema, no xanthelasma; normal ears, nose and oropharynx Neck: normal jugular venous pulsations and no hepatojugular reflux; brisk carotid pulses without delay and no carotid bruits Chest: clear to auscultation, no signs of consolidation by percussion or palpation, normal fremitus, symmetrical and full respiratory excursions Cardiovascular: normal position and quality of the apical impulse, regular rhythm, normal first and loud second heart sound, grade 2/6 early peaking systolic ejection murmur, rubs or gallops Abdomen: no tenderness or distention, no masses by palpation, no abnormal pulsatility or arterial bruits, normal bowel sounds, no hepatosplenomegaly Extremities: no clubbing, cyanosis or edema; 2+ radial, ulnar and brachial pulses bilaterally; 2+ right femoral, posterior tibial and dorsalis pedis pulses; 2+ left femoral, posterior tibial and dorsalis pedis pulses; no subclavian or femoral bruits Neurological: grossly nonfocal   EKG: Atrial fibrillation, 100% ventricular pacing  Lipid Panel     Component Value Date/Time   CHOL 137 09/15/2008 2030   TRIG 493* 09/15/2008 2030   HDL 22* 09/15/2008 2030   CHOLHDL 6.2 Ratio 09/15/2008 2030   VLDL NOT CALC mg/dL 09/15/2008 2030     LDLCALC See Comment mg/dL 09/15/2008 2030    BMET    Component Value Date/Time   NA 142 07/26/2013 1349   K 3.0* 07/26/2013 1349   CL 100 07/26/2013 1349   CO2 29 07/26/2013 1349   GLUCOSE 100* 07/26/2013 1349   BUN 10 07/26/2013 1349   CREATININE 1.30 08/09/2013 0736   CALCIUM 9.2 07/26/2013 1349   GFRNONAA 61* 07/26/2013 1349   GFRAA 71* 07/26/2013 1349     ASSESSMENT AND PLAN Paroxysmal atrial fibrillation Asymptomatic. Antiarrhythmics are not indicated. CHADSVasc score is elevated (age, HTN) and Derrick Long meets criteria for full anticoagulation. We discussed the relative pros and cons of warfarin versus multiple anticoagulants. I gave Derrick Long a prescription for Xarelto and Derrick Long will try to get it filled through the [facility] hospital.  Pacemaker Normal device function. Continue remote monitoring via Merlin and yearly office visits. Derrick Long has a very high percentage of ventricular pacing, possibly due to AV node conduction disease. Turn VIP on.  HYPERTENSION Well-controlled  HYPERLIPIDEMIA Derrick Long will get a copy of his labs from the [facility] for [place]   Orders Placed This Encounter  Procedures  . Implantable device check  . EKG 12-Lead   Meds ordered this encounter  Medications  . DISCONTD: rivaroxaban (XARELTO) 20 MG TABS tablet    Sig: Take 1 tablet (20 mg total) by mouth daily with supper.    Dispense:  30 tablet    Refill:  12  . rivaroxaban (XARELTO) 20 MG TABS tablet    Sig: Take 1 tablet (20 mg total) by mouth daily with supper.    Dispense:  90 tablet    Refill:  Trenton Charlee Squibb, MD, [facility] [phone] office [phone] pager

## 2013-09-12 ENCOUNTER — Encounter: Payer: Self-pay | Admitting: Cardiovascular Disease

## 2013-09-18 ENCOUNTER — Telehealth: Payer: Self-pay | Admitting: *Deleted

## 2013-09-18 NOTE — Telephone Encounter (Signed)
Faxed PA for xaretlo 20mg  once daily

## 2013-09-20 ENCOUNTER — Telehealth: Payer: Self-pay | Admitting: *Deleted

## 2013-09-20 NOTE — Telephone Encounter (Signed)
PA approved for Xarelto 20mg  through 09/19/2014.

## 2013-10-05 ENCOUNTER — Telehealth: Payer: Self-pay | Admitting: Cardiovascular Disease

## 2013-10-05 DIAGNOSIS — Z950 Presence of cardiac pacemaker: Secondary | ICD-10-CM

## 2013-10-05 DIAGNOSIS — I480 Paroxysmal atrial fibrillation: Secondary | ICD-10-CM

## 2013-10-05 DIAGNOSIS — R001 Bradycardia, unspecified: Secondary | ICD-10-CM

## 2013-10-05 MED ORDER — RIVAROXABAN 20 MG PO TABS: 20.0000 mg | ORAL_TABLET | Freq: Every day | ORAL | Status: DC

## 2013-10-05 NOTE — Telephone Encounter (Signed)
Patient notified samples are at front desk.   Patient also needs to sign form for record release to [facility]

## 2013-10-05 NOTE — Telephone Encounter (Signed)
Pt would like some samples of Xarelto 20 mg please. °

## 2013-10-30 ENCOUNTER — Telehealth: Payer: Self-pay | Admitting: Cardiovascular Disease

## 2013-10-30 DIAGNOSIS — R001 Bradycardia, unspecified: Secondary | ICD-10-CM

## 2013-10-30 DIAGNOSIS — I480 Paroxysmal atrial fibrillation: Secondary | ICD-10-CM

## 2013-10-30 DIAGNOSIS — Z950 Presence of cardiac pacemaker: Secondary | ICD-10-CM

## 2013-10-30 MED ORDER — RIVAROXABAN 20 MG PO TABS: 20.0000 mg | ORAL_TABLET | Freq: Every day | ORAL | Status: DC

## 2013-10-30 NOTE — Telephone Encounter (Signed)
Pt would like some samples of Xarelto 20 mg please. °

## 2013-10-30 NOTE — Telephone Encounter (Signed)
Spoke with patient and let him know Xarelto samples would be upfront for pick-up

## 2013-12-13 ENCOUNTER — Ambulatory Visit (INDEPENDENT_AMBULATORY_CARE_PROVIDER_SITE_OTHER): Payer: Medicare Other | Admitting: *Deleted

## 2013-12-13 ENCOUNTER — Encounter: Payer: Self-pay | Admitting: Cardiovascular Disease

## 2013-12-13 DIAGNOSIS — R001 Bradycardia, unspecified: Secondary | ICD-10-CM

## 2013-12-13 NOTE — Progress Notes (Signed)
Remote pacemaker transmission.   

## 2013-12-14 LAB — MDC_IDC_ENUM_SESS_TYPE_REMOTE
Battery Remaining Longevity: 95 mo
Battery Remaining Percentage: 91 %
Battery Voltage: 2.98 V
Brady Statistic AP VP Percent: 52 %
Brady Statistic AP VS Percent: 6.2 %
Brady Statistic AS VS Percent: 34 %
Brady Statistic RA Percent Paced: 54 %
Brady Statistic RV Percent Paced: 61 %
Implantable Pulse Generator Model: 2210
Implantable Pulse Generator Serial Number: 7474449
Lead Channel Pacing Threshold Amplitude: 0.75 V
Lead Channel Pacing Threshold Pulse Width: 0.5 ms
Lead Channel Sensing Intrinsic Amplitude: 3 mV
Lead Channel Sensing Intrinsic Amplitude: 9.5 mV
Lead Channel Setting Pacing Amplitude: 2 V
MDC IDC MSMT LEADCHNL RA IMPEDANCE VALUE: 380 Ohm
MDC IDC MSMT LEADCHNL RA PACING THRESHOLD AMPLITUDE: 1 V
MDC IDC MSMT LEADCHNL RV IMPEDANCE VALUE: 400 Ohm
MDC IDC MSMT LEADCHNL RV PACING THRESHOLD PULSEWIDTH: 0.5 ms
MDC IDC SESS DTM: 20151028060749
MDC IDC SET LEADCHNL RV PACING AMPLITUDE: 2 V
MDC IDC SET LEADCHNL RV PACING PULSEWIDTH: 0.5 ms
MDC IDC SET LEADCHNL RV SENSING SENSITIVITY: 2 mV
MDC IDC STAT BRADY AS VP PERCENT: 7.6 %

## 2013-12-20 ENCOUNTER — Encounter: Payer: Self-pay | Admitting: Cardiology

## 2014-01-25 ENCOUNTER — Encounter (HOSPITAL_COMMUNITY): Payer: Self-pay | Admitting: Cardiovascular Disease

## 2014-03-01 ENCOUNTER — Encounter (HOSPITAL_COMMUNITY): Payer: Self-pay | Admitting: Cardiovascular Disease

## 2014-03-19 ENCOUNTER — Ambulatory Visit (INDEPENDENT_AMBULATORY_CARE_PROVIDER_SITE_OTHER): Payer: Medicare Other | Admitting: *Deleted

## 2014-03-19 ENCOUNTER — Encounter: Payer: Self-pay | Admitting: Cardiovascular Disease

## 2014-03-19 DIAGNOSIS — R001 Bradycardia, unspecified: Secondary | ICD-10-CM

## 2014-03-19 NOTE — Progress Notes (Signed)
Remote pacemaker transmission.   

## 2014-03-20 LAB — MDC_IDC_ENUM_SESS_TYPE_REMOTE
Battery Remaining Longevity: 96 mo
Battery Voltage: 2.98 V
Brady Statistic AP VP Percent: 57 %
Brady Statistic AS VP Percent: 8.8 %
Brady Statistic AS VS Percent: 28 %
Brady Statistic RA Percent Paced: 60 %
Brady Statistic RV Percent Paced: 67 %
Date Time Interrogation Session: 20160201084204
Implantable Pulse Generator Model: 2210
Implantable Pulse Generator Serial Number: 7474449
Lead Channel Impedance Value: 380 Ohm
Lead Channel Pacing Threshold Amplitude: 0.75 V
Lead Channel Pacing Threshold Amplitude: 1 V
Lead Channel Pacing Threshold Pulse Width: 0.5 ms
Lead Channel Pacing Threshold Pulse Width: 0.5 ms
Lead Channel Sensing Intrinsic Amplitude: 12 mV
Lead Channel Sensing Intrinsic Amplitude: 3.5 mV
Lead Channel Setting Pacing Amplitude: 2 V
Lead Channel Setting Pacing Pulse Width: 0.5 ms
Lead Channel Setting Sensing Sensitivity: 2 mV
MDC IDC MSMT BATTERY REMAINING PERCENTAGE: 91 %
MDC IDC MSMT LEADCHNL RV IMPEDANCE VALUE: 480 Ohm
MDC IDC SET LEADCHNL RV PACING AMPLITUDE: 2 V
MDC IDC STAT BRADY AP VS PERCENT: 5.5 %

## 2014-03-28 ENCOUNTER — Encounter: Payer: Self-pay | Admitting: Cardiology

## 2014-04-02 IMAGING — CR DG FOOT COMPLETE 3+V*L*
3 series · 3 of 3 positions shown · non-contrast
Comparison: No priors.

CLINICAL DATA: Foot pain.  Unable to walk.

LEFT FOOT - COMPLETE 3+ VIEW

[t foot ap left]
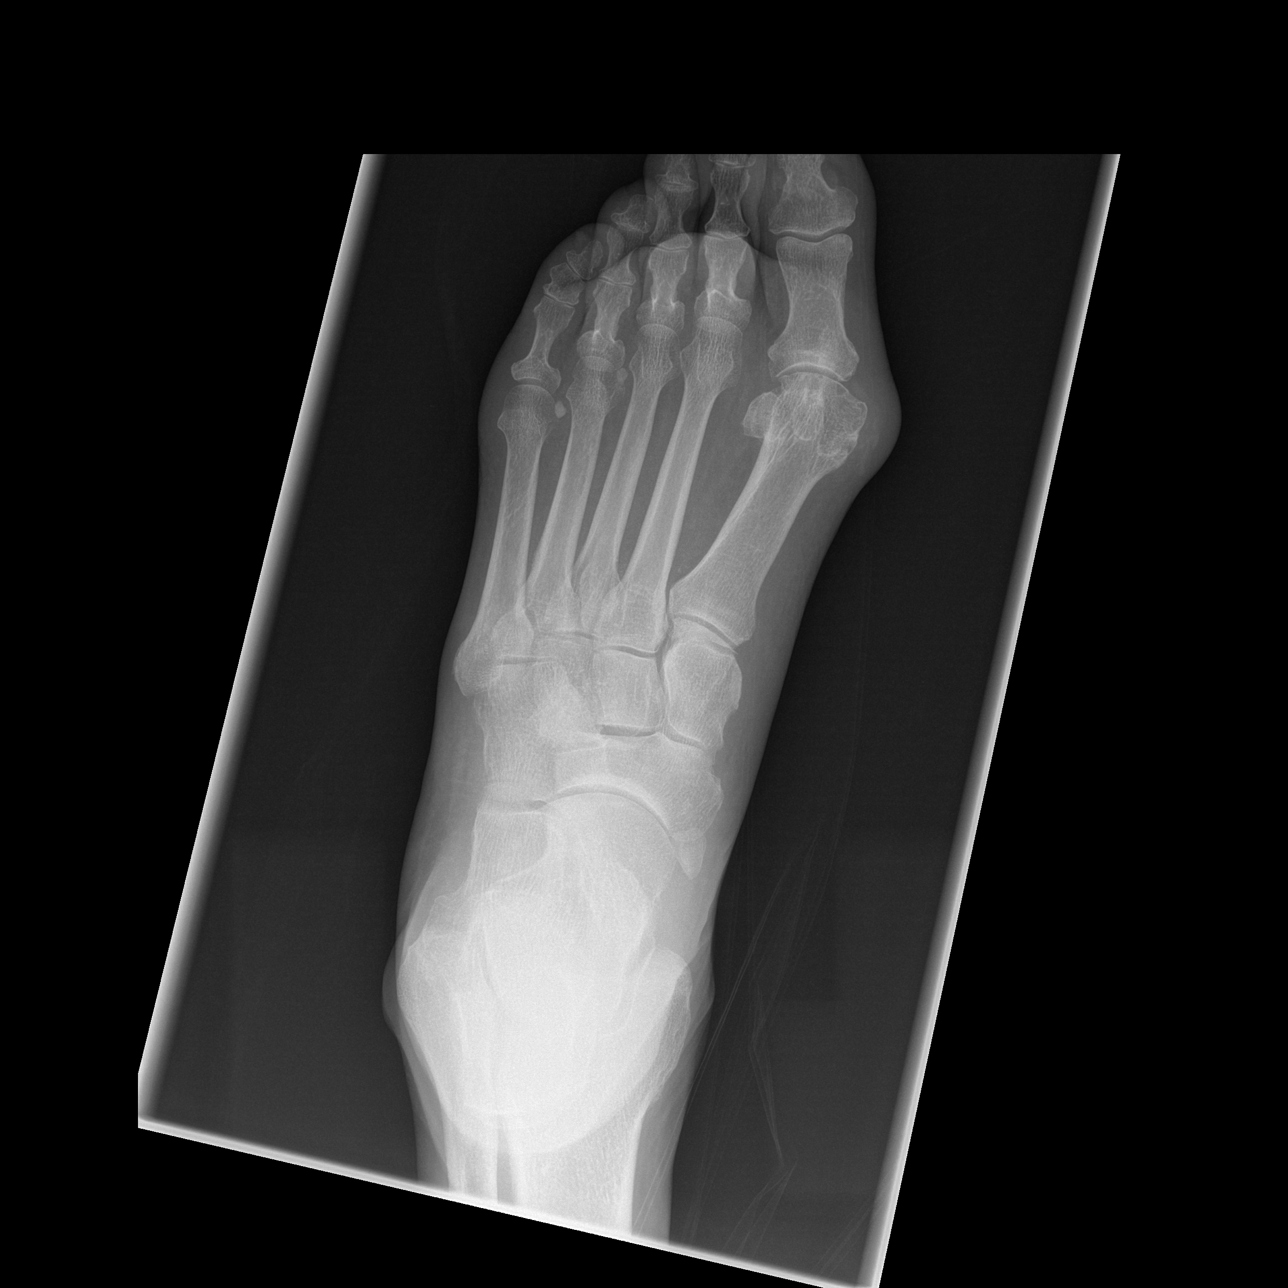

[t foot oblique left]
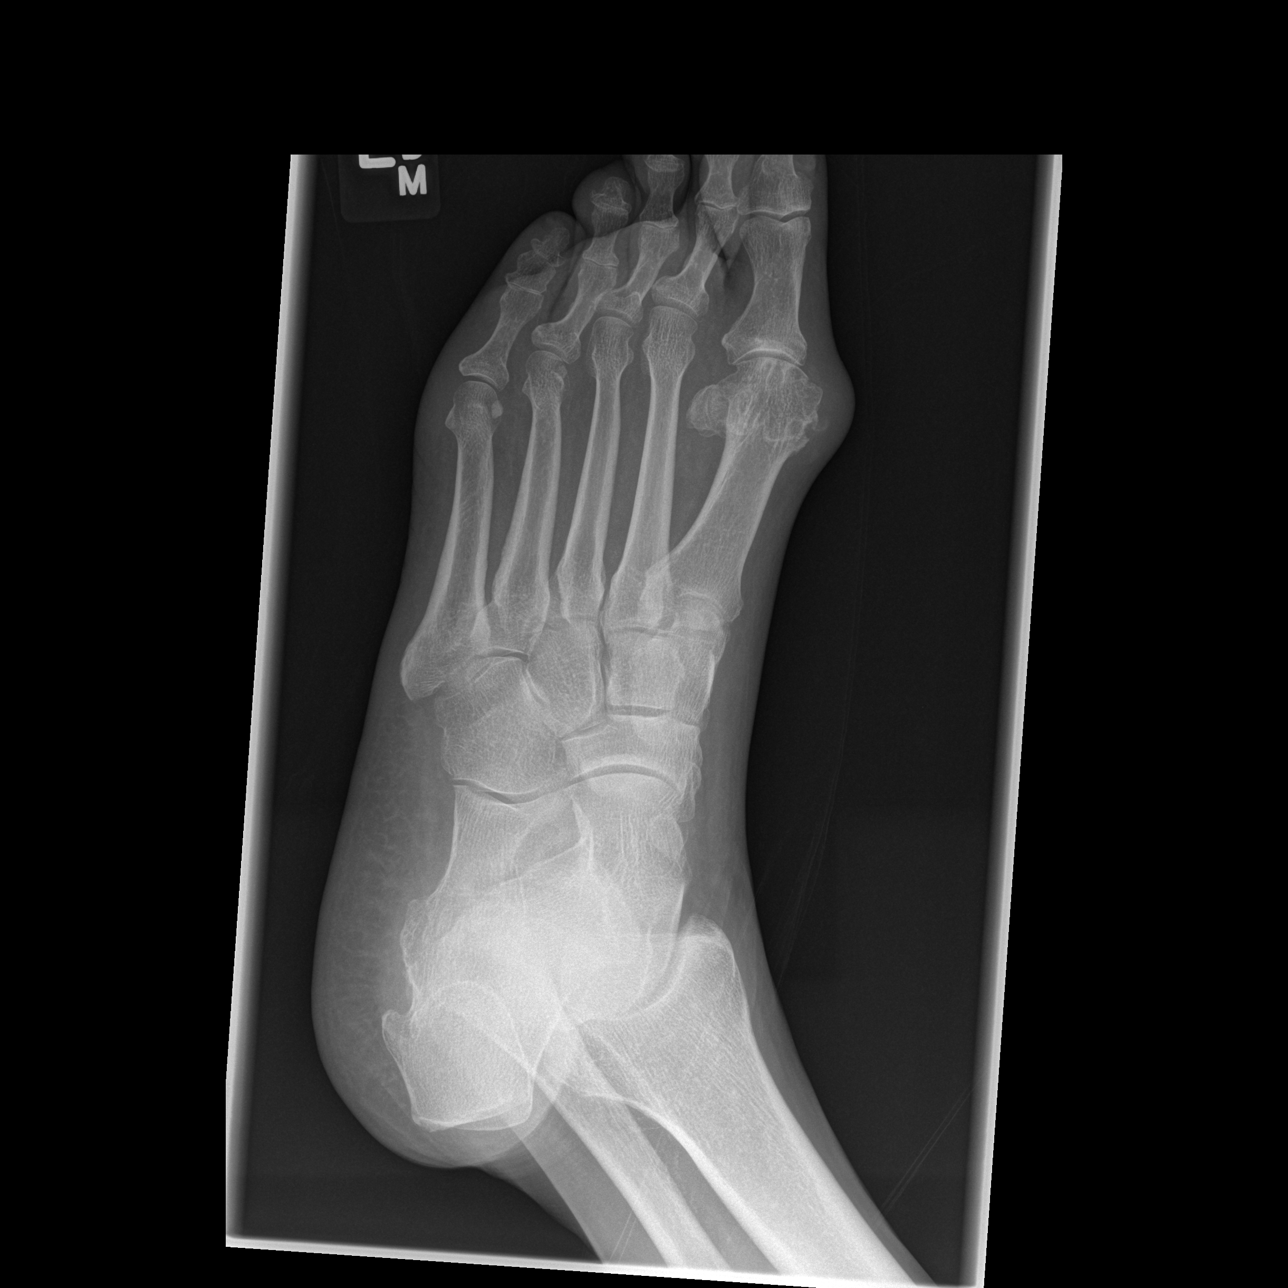

[t foot lat left]
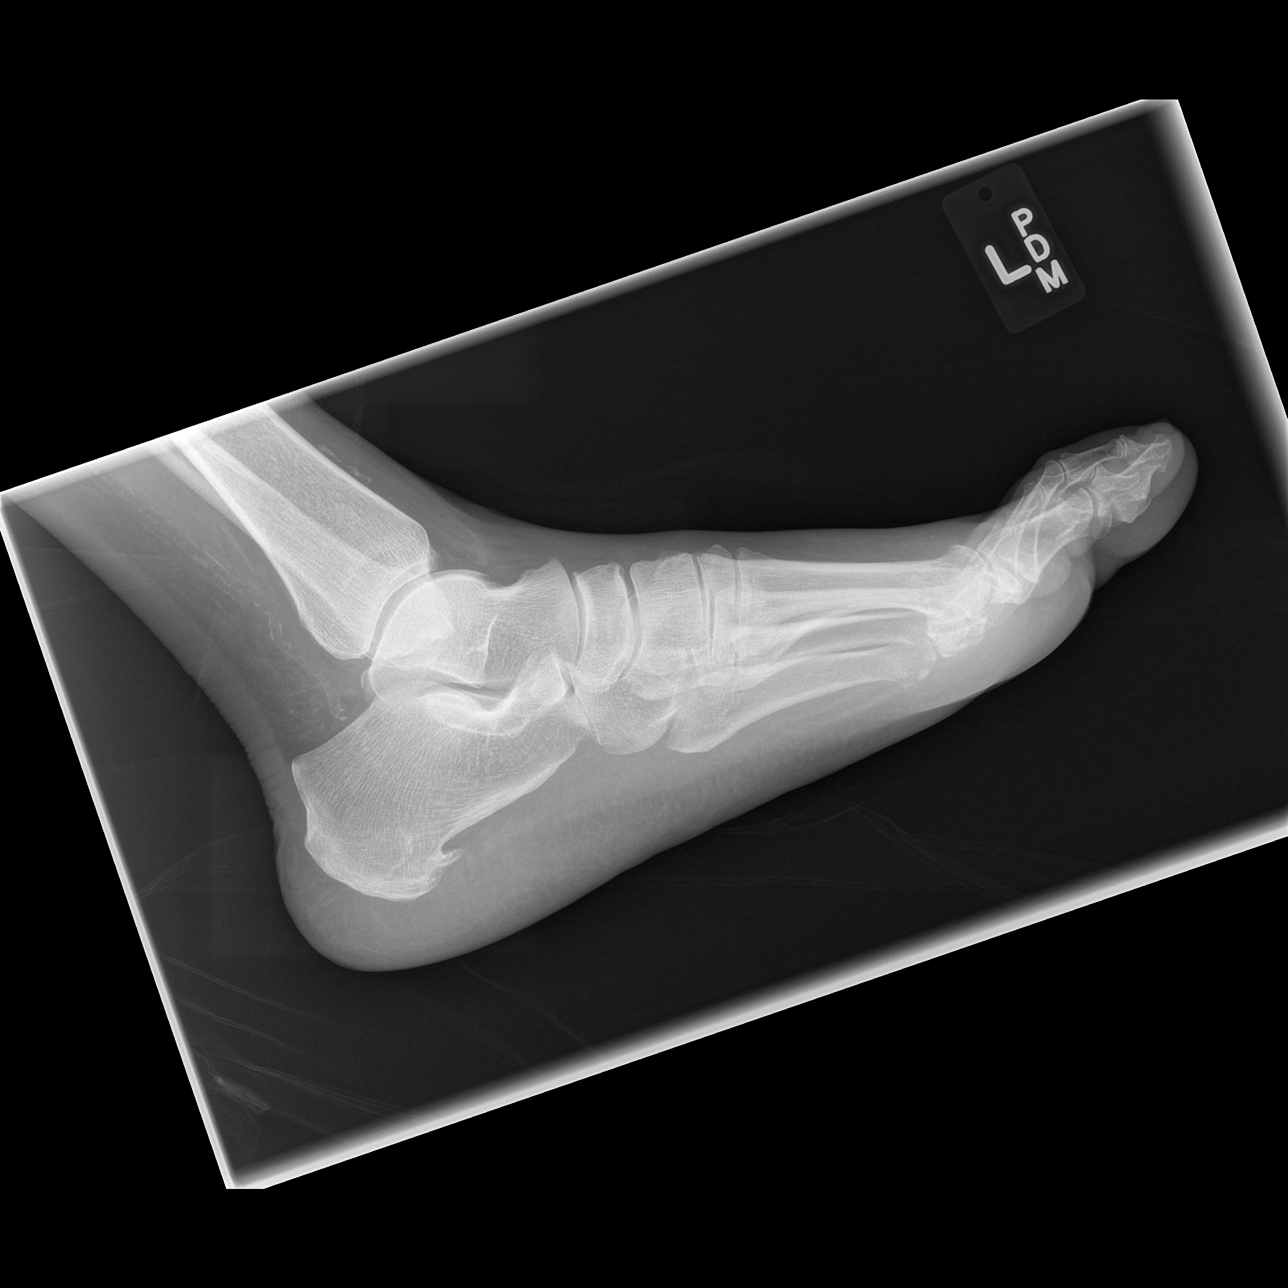

[3 of 3 positions shown; findings below may reference images not displayed]

FINDINGS: Three views of the left foot demonstrate a bunion
deformity.  Small plantar calcaneal spur.  No acute displaced
fracture or dislocation.
IMPRESSION: 1.  No acute radiographic abnormality of the left foot.
2.  Bunion deformity.
3.  Small plantar calcaneal spur.

## 2014-06-18 ENCOUNTER — Ambulatory Visit (INDEPENDENT_AMBULATORY_CARE_PROVIDER_SITE_OTHER): Payer: Medicare Other | Admitting: *Deleted

## 2014-06-18 ENCOUNTER — Encounter: Payer: Self-pay | Admitting: Cardiovascular Disease

## 2014-06-18 DIAGNOSIS — I480 Paroxysmal atrial fibrillation: Secondary | ICD-10-CM | POA: Diagnosis not present

## 2014-06-20 NOTE — Progress Notes (Signed)
Remote pacemaker transmission.   

## 2014-06-24 LAB — CUP PACEART REMOTE DEVICE CHECK
Battery Remaining Longevity: 92 mo
Battery Remaining Percentage: 91 %
Battery Voltage: 2.98 V
Brady Statistic AP VP Percent: 59 %
Brady Statistic AP VS Percent: 5.3 %
Brady Statistic AS VS Percent: 26 %
Brady Statistic RA Percent Paced: 61 %
Brady Statistic RV Percent Paced: 68 %
Date Time Interrogation Session: 20160502071438
Lead Channel Impedance Value: 350 Ohm
Lead Channel Pacing Threshold Amplitude: 1 V
Lead Channel Pacing Threshold Pulse Width: 0.5 ms
Lead Channel Sensing Intrinsic Amplitude: 10.8 mV
Lead Channel Sensing Intrinsic Amplitude: 3.1 mV
Lead Channel Setting Pacing Amplitude: 2 V
MDC IDC MSMT LEADCHNL RV IMPEDANCE VALUE: 400 Ohm
MDC IDC MSMT LEADCHNL RV PACING THRESHOLD AMPLITUDE: 0.75 V
MDC IDC MSMT LEADCHNL RV PACING THRESHOLD PULSEWIDTH: 0.5 ms
MDC IDC SET LEADCHNL RV PACING AMPLITUDE: 2 V
MDC IDC SET LEADCHNL RV PACING PULSEWIDTH: 0.5 ms
MDC IDC SET LEADCHNL RV SENSING SENSITIVITY: 2 mV
MDC IDC STAT BRADY AS VP PERCENT: 8.9 %
Pulse Gen Model: 2210
Pulse Gen Serial Number: 7474449

## 2014-06-28 ENCOUNTER — Encounter: Payer: Self-pay | Admitting: Cardiology

## 2014-06-29 ENCOUNTER — Telehealth: Payer: Self-pay | Admitting: *Deleted

## 2014-06-29 NOTE — Telephone Encounter (Signed)
-----   Message from Sanda Klein, MD sent at 06/27/2014  6:42 PM EDT ----- Please ask him to STOP aspirin. CONTINUE Xarelto only.

## 2014-09-11 ENCOUNTER — Encounter: Payer: Self-pay | Admitting: *Deleted

## 2016-02-15 ENCOUNTER — Encounter (HOSPITAL_COMMUNITY): Payer: Self-pay | Admitting: Emergency Medicine

## 2016-02-15 ENCOUNTER — Emergency Department (HOSPITAL_COMMUNITY)
Admission: EM | Admit: 2016-02-15 | Discharge: 2016-02-15 | Disposition: A | Discharge status: Home / Self Care | Payer: Medicare Other | Attending: Emergency Medicine | Admitting: Emergency Medicine

## 2016-02-15 DIAGNOSIS — I10 Essential (primary) hypertension: Secondary | ICD-10-CM | POA: Insufficient documentation

## 2016-02-15 DIAGNOSIS — Z8546 Personal history of malignant neoplasm of prostate: Secondary | ICD-10-CM | POA: Diagnosis not present

## 2016-02-15 DIAGNOSIS — Z79899 Other long term (current) drug therapy: Secondary | ICD-10-CM | POA: Insufficient documentation

## 2016-02-15 DIAGNOSIS — R0981 Nasal congestion: Secondary | ICD-10-CM | POA: Diagnosis present

## 2016-02-15 DIAGNOSIS — Z87891 Personal history of nicotine dependence: Secondary | ICD-10-CM | POA: Diagnosis not present

## 2016-02-15 DIAGNOSIS — J0100 Acute maxillary sinusitis, unspecified: Secondary | ICD-10-CM

## 2016-02-15 HISTORY — DX: Gout, unspecified: M10.9

## 2016-02-15 HISTORY — DX: Hyperlipidemia, unspecified: E78.5

## 2016-02-15 MED ORDER — CETIRIZINE HCL 10 MG PO CAPS: 10.0000 mg | ORAL_CAPSULE | Freq: Every day | ORAL | 2 refills | Status: DC

## 2016-02-15 MED ORDER — ACETAMINOPHEN 500 MG PO TABS
1000.0000 mg | ORAL_TABLET | Freq: Once | ORAL | Status: AC
  Administered 2016-02-15: 1000 mg via ORAL
  Filled 2016-02-15: qty 2

## 2016-02-15 MED ORDER — FLUTICASONE PROPIONATE 50 MCG/ACT NA SUSP: 2.0000 | Freq: Every day | NASAL | 2 refills | Status: DC

## 2016-02-15 MED ORDER — DEXAMETHASONE SODIUM PHOSPHATE 10 MG/ML IJ SOLN
10.0000 mg | Freq: Once | INTRAMUSCULAR | Status: AC
  Administered 2016-02-15: 10 mg via INTRAMUSCULAR
  Filled 2016-02-15: qty 1

## 2016-02-15 NOTE — ED Provider Notes (Signed)
TIME SEEN: 5:15 AM  CHIEF COMPLAINT: Nasal congestion, sinus pressure  HPI: Pt is a 82 y.o. Long with history of hypertension, hyperlipidemia, previous tobacco use who presents the emergency department with over 2 weeks of nasal congestion that is clear in nature, sinus pressure. Denies any fevers, chills, chest pain or shortness of breath, cough, vomiting or diarrhea. No headache, neck pain or neck stiffness. Reports he was seen by Dr. Karie Kirks over 2 weeks ago instructed to use Afrin. He has been using this medication every day. He was previously on an antihistamine which she has stopped. Has not been on steroids or antibiotics. Does not have a history of diabetes. Denies that he has any green, yellow nasal discharge/congestion.  ROS: See HPI Constitutional: no fever  Eyes: no drainage  ENT: no runny nose   Cardiovascular:  no chest pain  Resp: no SOB  GI: no vomiting GU: no dysuria Integumentary: no rash  Allergy: no hives  Musculoskeletal: no leg swelling  Neurological: no slurred speech ROS otherwise negative  PAST MEDICAL HISTORY/PAST SURGICAL HISTORY:  Past Medical History:  Diagnosis Date  . Acid reflux   . Ex-cigarette smoker   . Gout   . Hyperlipemia   . Hypertension   . Prostate cancer ([place]) 6.23.2015   SEEDS 20 YEARS AGO  . Prostate disease     MEDICATIONS:  Prior to Admission medications   Medication Sig Start Date End Date Taking? Authorizing Provider  amLODipine (NORVASC) 10 MG tablet Take 10 mg by mouth every morning.     Historical Provider, MD  atorvastatin (LIPITOR) 20 MG tablet Take 1 tablet (20 mg total) by mouth daily at 6 PM. 07/23/12   Brittainy M Rosita Fire, PA-C  cetirizine (ZYRTEC) 10 MG tablet Take 10 mg by mouth at bedtime.     Historical Provider, MD  colchicine 0.6 MG tablet Take 0.6 mg by mouth 2 (two) times daily as needed (gout).    Historical Provider, MD  Cyanocobalamin (B-12) 1000 MCG CAPS Take 1 tablet by mouth every morning.     Historical  Provider, MD  furosemide (LASIX) 40 MG tablet Take 40 mg by mouth every morning.     Historical Provider, MD  Nebivolol HCl (BYSTOLIC) 20 MG TABS Take 20 mg by mouth every morning.     Historical Provider, MD  niacin (NIASPAN) 500 MG CR tablet Take 500 mg by mouth at bedtime.    Historical Provider, MD  omeprazole (PRILOSEC) 20 MG capsule Take 20 mg by mouth every morning.     Historical Provider, MD  rivaroxaban (XARELTO) 20 MG TABS tablet Take 1 tablet (20 mg total) by mouth daily with supper. 10/30/13   Mihai Croitoru, MD  Saw Palmetto 450 MG CAPS Take 450 mg by mouth at bedtime.     Historical Provider, MD  tamsulosin (FLOMAX) 0.4 MG CAPS Take 0.4 mg by mouth every morning.     Historical Provider, MD  temazepam (RESTORIL) 15 MG capsule Take 15 mg by mouth at bedtime as needed for sleep.    Historical Provider, MD    ALLERGIES:  No Known Allergies  SOCIAL HISTORY:  Social History  Substance Use Topics  . Smoking status: Former Smoker    Quit date: 07/20/2002  . Smokeless tobacco: Never Used  . Alcohol use No    FAMILY HISTORY: History reviewed. No pertinent family history.  EXAM: BP 160/88 (BP Location: Left Arm)   Pulse 66   Temp 97.7 F (36.5 C) (Oral)  Resp 18   Ht 6' (1.829 m)   Wt 200 lb (90.7 kg)   SpO2 99%   BMI 27.12 kg/m  CONSTITUTIONAL: Alert and oriented and responds appropriately to questions. Well-appearing; well-nourished HEAD: Normocephalic EYES: Conjunctivae clear, PERRL, EOMI ENT: normal nose; no rhinorrhea; moist mucous membranes; No pharyngeal erythema or petechiae, no tonsillar hypertrophy or exudate, no uvular deviation, no unilateral swelling, no trismus or drooling, no muffled voice, normal phonation, no stridor, no dental caries present, no drainable dental abscess noted, no Ludwig's angina, tongue sits flat in the bottom of the mouth, no angioedema, no facial erythema or warmth, no facial swelling; no pain with movement of the neck; patient does have  some maxillary sinus tenderness on exam bilaterally but no erythema or warmth of this area or swelling. He does have inflamed pale appearing bilateral nasal turbinates. Clear nasal congestion noted on exam. NECK: Supple, no meningismus, no nuchal rigidity, no LAD  CARD: RRR; S1 and S2 appreciated; no murmurs, no clicks, no rubs, no gallops RESP: Normal chest excursion without splinting or tachypnea; breath sounds clear and equal bilaterally; no wheezes, no rhonchi, no rales, no hypoxia or respiratory distress, speaking full sentences ABD/GI: Normal bowel sounds; non-distended; soft, non-tender, no rebound, no guarding, no peritoneal signs, no hepatosplenomegaly BACK:  The back appears normal and is non-tender to palpation, there is no CVA tenderness EXT: Normal ROM in all joints; non-tender to palpation; no edema; normal capillary refill; no cyanosis, no calf tenderness or swelling    SKIN: Normal color for age and race; warm; no rash NEURO: Moves all extremities equally, sensation to light touch intact diffusely, cranial nerves II through XII intact, normal speech PSYCH: The patient's mood and manner are appropriate. Grooming and personal hygiene are appropriate.  MEDICAL DECISION MAKING: Patient here with likely allergic sinusitis. Nothing at this time to suggest that this is infectious in nature and I do not feel he needs antibiotics. He is comfortable with this plan. He has not had any fever, chills, yellow or green nasal discharge. States had discharge from his nose has been clear and his turbinates appear inflamed and are pale. Have recommended that he stop using Afrin as he has been using it for almost 2 weeks and discussed with him that this can be caused him to have rebound congestion. I recommended that he start using Flonase once a day and Zyrtec once a day. I recommended using a humidifier in his room and over-the-counter nasal saline spray. Recommended Tylenol to take as needed for pain. He  is very well-appearing without signs of meningitis, pneumonia. Afebrile in the emergency department. He is not a diabetic. I do not think he has an atypical sinusitis, fungal infection. We'll give him a dose of IM Decadron here in the emergency department for symptomatic relief. Discussed return precautions with patient. He verbalizes understanding and is comfortable with this plan. Recommended close follow-up with his PCP if symptoms continue.  At this time, I do not feel there is any life-threatening condition present. I have reviewed and discussed all results (EKG, imaging, lab, urine as appropriate) and exam findings with patient/family. I have reviewed nursing notes and appropriate previous records.  I feel the patient is safe to be discharged home without further emergent workup and can continue workup as an outpatient as needed. Discussed usual and customary return precautions. Patient/family verbalize understanding and are comfortable with this plan.  Outpatient follow-up has been provided. All questions have been answered.  [place], DO 02/15/16 [phone]

## 2016-02-15 NOTE — ED Notes (Signed)
Pt states understanding of care given and follow up instructions 

## 2016-02-15 NOTE — ED Triage Notes (Signed)
Pt states he has been having nasal congestion with drainage for over 1 week.  Been taking Tylenol sinus with no results

## 2016-02-15 NOTE — Discharge Instructions (Signed)
Please stop using Afrin as this may make your nasal congestion worse. You may take Tylenol 1000 mg every 6 hours as needed for pain. We have given you an injection of steroids that will last for several days. I also recommended you use nasal steroid spray once a day (2 sprays into each nostril) and start antihistamine once a day. If you develop fevers, increasing sinus pressure headache, change in the discharge from your nose (appears green/yellow), please return to your primary care doctor for follow-up.  You may also use a humidifier in your room at night which may help keep your arm mucous membranes moist. You may also use over-the-counter nasal saline to help keep your mucous membranes moist. You may use nasal saline several times a day.  I do not think that this is infectious in nature and I do not feel antibiotics will help you at this time.

## 2016-03-30 ENCOUNTER — Ambulatory Visit (INDEPENDENT_AMBULATORY_CARE_PROVIDER_SITE_OTHER): Payer: Medicare Other | Admitting: *Deleted

## 2016-03-30 DIAGNOSIS — I480 Paroxysmal atrial fibrillation: Secondary | ICD-10-CM | POA: Diagnosis not present

## 2016-03-30 NOTE — Progress Notes (Signed)
Remote pacemaker transmission.   

## 2016-03-31 ENCOUNTER — Encounter: Payer: Self-pay | Admitting: Cardiology

## 2016-04-02 LAB — CUP PACEART REMOTE DEVICE CHECK
Battery Remaining Longevity: 97 mo
Battery Remaining Percentage: 95.5 %
Brady Statistic AP VS Percent: 4.3 %
Brady Statistic AS VP Percent: 9.1 %
Brady Statistic RA Percent Paced: 67 %
Brady Statistic RV Percent Paced: 75 %
Implantable Lead Implant Date: 20140606
Implantable Lead Implant Date: 20140606
Implantable Lead Location: 753860
Lead Channel Impedance Value: 350 Ohm
Lead Channel Impedance Value: 360 Ohm
Lead Channel Pacing Threshold Pulse Width: 0.5 ms
Lead Channel Sensing Intrinsic Amplitude: 11.8 mV
Lead Channel Sensing Intrinsic Amplitude: 2.5 mV
Lead Channel Setting Pacing Amplitude: 2 V
MDC IDC LEAD LOCATION: 753859
MDC IDC MSMT BATTERY VOLTAGE: 2.96 V
MDC IDC MSMT LEADCHNL RA PACING THRESHOLD AMPLITUDE: 1 V
MDC IDC MSMT LEADCHNL RV PACING THRESHOLD AMPLITUDE: 0.75 V
MDC IDC MSMT LEADCHNL RV PACING THRESHOLD PULSEWIDTH: 0.5 ms
MDC IDC PG IMPLANT DT: 20140606
MDC IDC PG SERIAL: 7474449
MDC IDC SESS DTM: 20180211030513
MDC IDC SET LEADCHNL RV PACING AMPLITUDE: 2 V
MDC IDC SET LEADCHNL RV PACING PULSEWIDTH: 0.5 ms
MDC IDC SET LEADCHNL RV SENSING SENSITIVITY: 2 mV
MDC IDC STAT BRADY AP VP PERCENT: 65 %
MDC IDC STAT BRADY AS VS PERCENT: 21 %

## 2016-06-04 ENCOUNTER — Emergency Department (HOSPITAL_COMMUNITY)
Admission: EM | Admit: 2016-06-04 | Discharge: 2016-06-04 | Disposition: A | Discharge status: Home / Self Care | Payer: Medicare Other | Attending: Emergency Medicine | Admitting: Emergency Medicine

## 2016-06-04 ENCOUNTER — Encounter (HOSPITAL_COMMUNITY): Payer: Self-pay | Admitting: *Deleted

## 2016-06-04 ENCOUNTER — Other Ambulatory Visit: Payer: Self-pay

## 2016-06-04 ENCOUNTER — Emergency Department (HOSPITAL_COMMUNITY): Payer: Medicare Other

## 2016-06-04 DIAGNOSIS — Z8546 Personal history of malignant neoplasm of prostate: Secondary | ICD-10-CM | POA: Diagnosis not present

## 2016-06-04 DIAGNOSIS — I10 Essential (primary) hypertension: Secondary | ICD-10-CM | POA: Insufficient documentation

## 2016-06-04 DIAGNOSIS — Z79899 Other long term (current) drug therapy: Secondary | ICD-10-CM | POA: Diagnosis not present

## 2016-06-04 DIAGNOSIS — Z87891 Personal history of nicotine dependence: Secondary | ICD-10-CM | POA: Diagnosis not present

## 2016-06-04 DIAGNOSIS — R0602 Shortness of breath: Secondary | ICD-10-CM | POA: Diagnosis not present

## 2016-06-04 DIAGNOSIS — R531 Weakness: Secondary | ICD-10-CM | POA: Insufficient documentation

## 2016-06-04 LAB — CBC
HEMATOCRIT: 25.6 % — AB (ref 39.0–52.0)
Hemoglobin: 8.6 g/dL — ABNORMAL LOW (ref 13.0–17.0)
MCH: 31.9 pg (ref 26.0–34.0)
MCHC: 33.6 g/dL (ref 30.0–36.0)
MCV: 94.8 fL (ref 78.0–100.0)
Platelets: 281 10*3/uL (ref 150–400)
RBC: 2.7 MIL/uL — ABNORMAL LOW (ref 4.22–5.81)
RDW: 14 % (ref 11.5–15.5)
WBC: 4.9 10*3/uL (ref 4.0–10.5)

## 2016-06-04 LAB — BASIC METABOLIC PANEL
Anion gap: 7 (ref 5–15)
BUN: 21 mg/dL — ABNORMAL HIGH (ref 6–20)
CHLORIDE: 106 mmol/L (ref 101–111)
CO2: 26 mmol/L (ref 22–32)
Calcium: 8.6 mg/dL — ABNORMAL LOW (ref 8.9–10.3)
Creatinine, Ser: 1.21 mg/dL (ref 0.61–1.24)
GFR calc non Af Amer: 52 mL/min — ABNORMAL LOW (ref >60)
GFR, EST AFRICAN AMERICAN: 60 mL/min — AB (ref >60)
Glucose, Bld: 110 mg/dL — ABNORMAL HIGH (ref 65–99)
POTASSIUM: 3.3 mmol/L — AB (ref 3.5–5.1)
Sodium: 139 mmol/L (ref 135–145)

## 2016-06-04 LAB — URINALYSIS, ROUTINE W REFLEX MICROSCOPIC
BILIRUBIN URINE: NEGATIVE
GLUCOSE, UA: NEGATIVE mg/dL
Hgb urine dipstick: NEGATIVE
KETONES UR: NEGATIVE mg/dL
Leukocytes, UA: NEGATIVE
Nitrite: NEGATIVE
PH: 5 (ref 5.0–8.0)
Protein, ur: NEGATIVE mg/dL
Specific Gravity, Urine: 1.016 (ref 1.005–1.030)

## 2016-06-04 LAB — BRAIN NATRIURETIC PEPTIDE: B Natriuretic Peptide: 27 pg/mL (ref 0.0–100.0)

## 2016-06-04 MED ORDER — ALPRAZOLAM 0.5 MG PO TABS: 0.5000 mg | ORAL_TABLET | Freq: Every evening | ORAL | 0 refills | Status: DC | PRN

## 2016-06-04 MED ORDER — SODIUM CHLORIDE 0.9 % IV SOLN
INTRAVENOUS | Status: DC
  Administered 2016-06-04: 125 mL/h via INTRAVENOUS

## 2016-06-04 NOTE — Discharge Instructions (Signed)
As discussed, your evaluation today has been largely reassuring.  But, it is important that you monitor your condition carefully, and do not hesitate to return to the ED if you develop new, or concerning changes in your condition. ? ?Otherwise, please follow-up with your physician for appropriate ongoing care. ? ?

## 2016-06-04 NOTE — ED Notes (Signed)
Patient transported to X-ray 

## 2016-06-04 NOTE — ED Provider Notes (Signed)
[facility] DEPT Provider Note   CSN: [id number] Arrival date & time: 06/04/16  1532     History   Chief Complaint Chief Complaint  Patient presents with  . Weakness  . Leg Swelling    HPI RICHARDO POPOFF is a 83 y.o. male.  HPI  Patient presents with concern of weakness, fatigue. He notes that symptoms began about 3 days ago, since onset of been persistent, with no clear alleviating or exacerbating factors. He denies fever, chills, vomiting, diarrhea, abdominal pain, chest pain. He does describe some soreness in his legs with ambulation, as well as some moderate swelling, this seems possibly chronic, as the patient is a poor historian in this regard. He denies recent medication changes, diet changes, activity changes.  However, after my initial evaluation is complete, the patient's daughters arrived, they note that the patient's wife died 5 days ago.  Past Medical History:  Diagnosis Date  . Acid reflux   . Ex-cigarette smoker   . Gout   . Hyperlipemia   . Hypertension   . Prostate cancer ([place]) 6.23.2015   SEEDS 20 YEARS AGO  . Prostate disease     Patient Active Problem List   Diagnosis Date Noted  . Paroxysmal atrial fibrillation ([place]) 09/02/2012  . HTN (hypertension) 09/02/2012  . Pacemaker 08/03/2012  . Chest pain 07/31/2012  . Leukocytosis, unspecified 07/31/2012  . ARF (acute renal failure) ([place]) 07/31/2012  . Gout 07/31/2012  . First degree AV block, PR interval 370 ms 07/19/2012  . Chest pain, atypical 07/19/2012  . Acute renal insufficiency 07/19/2012  . Hypokalemia 07/19/2012  . Syncope 07/19/2012  . Bradycardia 07/19/2012  . LUMBAR STRAIN 09/10/2008  . ANEMIA, IRON DEFICIENCY NOS 08/16/2008  . OSTEOARTHRITIS, MODERATE 09/21/2007  . INSOMNIA 06/24/2007  . HYPERLIPIDEMIA 04/05/2007  . DENTAL CARIES 10/08/2006  . LATERAL MENISCUS TEAR, RIGHT 04/19/2006  . MIGRAINE HEADACHE 12/18/2005  . HYPERTENSION 12/18/2005  . GERD 12/18/2005  . RENAL  CALCULUS 12/18/2005  . BENIGN PROSTATIC HYPERTROPHY, WITH OBSTRUCTION 12/18/2005  . ARTHRITIS 12/18/2005  . LOW BACK PAIN 12/18/2005  . OSTEOPENIA 12/18/2005  . COLON CANCER, HX OF 12/18/2005  . PROSTATE CANCER, HX OF 12/18/2005    Past Surgical History:  Procedure Laterality Date  . LEFT HEART CATHETERIZATION WITH CORONARY ANGIOGRAM N/A 07/21/2012   Procedure: LEFT HEART CATHETERIZATION WITH CORONARY ANGIOGRAM;  Surgeon: Troy Sine, MD;  Location: [facility] CATH LAB;  Service: Cardiovascular;  Laterality: N/A;  . PACEMAKER INSERTION    . PERMANENT PACEMAKER INSERTION N/A 07/22/2012   Procedure: PERMANENT PACEMAKER INSERTION;  Surgeon: Sanda Klein, MD;  Location: [facility] CATH LAB;  Service: Cardiovascular;  Laterality: N/A;  . PROSTATE SURGERY         Home Medications    Prior to Admission medications   Medication Sig Start Date End Date Taking? Authorizing Provider  alfuzosin (UROXATRAL) 10 MG 24 hr tablet Take 10 mg by mouth at bedtime.    Yes Historical Provider, MD  amLODipine (NORVASC) 10 MG tablet Take 10 mg by mouth every morning.    Yes Historical Provider, MD  atorvastatin (LIPITOR) 20 MG tablet Take 1 tablet (20 mg total) by mouth daily at 6 PM. 07/23/12  Yes Brittainy Erie Noe, PA-C  Cetirizine HCl (ZYRTEC ALLERGY) 10 MG CAPS Take 1 capsule (10 mg total) by mouth daily. 02/15/16  Yes Kristen N Ward, DO  colchicine 0.6 MG tablet Take 0.6 mg by mouth 2 (two) times daily as needed (gout).   Yes Historical Provider,  MD  Cyanocobalamin (B-12) 1000 MCG CAPS Take 1 tablet by mouth every morning.    Yes Historical Provider, MD  diclofenac sodium (VOLTAREN) 1 % GEL Apply 2 g topically 4 (four) times daily.   Yes Historical Provider, MD  fluticasone (FLONASE) 50 MCG/ACT nasal spray Place 2 sprays into both nostrils daily. 02/15/16  Yes Kristen N Ward, DO  furosemide (LASIX) 40 MG tablet Take 40 mg by mouth every morning.    Yes Historical Provider, MD  lisinopril (PRINIVIL,ZESTRIL) 40 MG  tablet Take 40 mg by mouth daily.   Yes Historical Provider, MD  magnesium oxide (MAG-OX) 400 MG tablet Take 400 mg by mouth daily.   Yes Historical Provider, MD  omeprazole (PRILOSEC) 20 MG capsule Take 20 mg by mouth every morning.    Yes Historical Provider, MD  rivaroxaban (XARELTO) 20 MG TABS tablet Take 1 tablet (20 mg total) by mouth daily with supper. 10/30/13  Yes Mihai Croitoru, MD  traMADol (ULTRAM) 50 MG tablet Take 50 mg by mouth every 6 (six) hours as needed.    Historical Provider, MD    Family History No family history on file.  Social History Social History  Substance Use Topics  . Smoking status: Former Smoker    Quit date: 07/20/2002  . Smokeless tobacco: Never Used  . Alcohol use No     Allergies   Patient has no known allergies.   Review of Systems Review of Systems  Constitutional:       Per HPI, otherwise negative  HENT:       Per HPI, otherwise negative  Respiratory:       Per HPI, otherwise negative  Cardiovascular:       Per HPI, otherwise negative  Gastrointestinal: Negative for vomiting.  Endocrine:       Negative aside from HPI  Genitourinary:       Neg aside from HPI   Musculoskeletal:       Per HPI, otherwise negative  Skin: Negative.   Neurological: Positive for weakness. Negative for syncope.     Physical Exam Updated Vital Signs BP (!) 155/54 (BP Location: Right Arm)   Pulse 72   Temp 98.5 F (36.9 C) (Oral)   Resp 19   Ht 6\' 1"  (1.854 m)   Wt 215 lb (97.5 kg)   SpO2 99%   BMI 28.37 kg/m   Physical Exam  Constitutional: He is oriented to person, place, and time. He appears well-developed. No distress.  HENT:  Head: Normocephalic and atraumatic.  Eyes: Conjunctivae and EOM are normal.  Cardiovascular: Normal rate and regular rhythm.   Pulmonary/Chest: Effort normal. No stridor. No respiratory distress.  Abdominal: He exhibits no distension.  Musculoskeletal: He exhibits no edema.  Neurological: He is alert and oriented  to person, place, and time.  Skin: Skin is warm and dry.  Psychiatric: He has a normal mood and affect.  Nursing note and vitals reviewed.    ED Treatments / Results  Labs (all labs ordered are listed, but only abnormal results are displayed) Labs Reviewed  BASIC METABOLIC PANEL  CBC  URINALYSIS, ROUTINE W REFLEX MICROSCOPIC  BRAIN NATRIURETIC PEPTIDE    EKG  EKG Interpretation  Date/Time:  Thursday June 04 2016 15:52:38 EDT Ventricular Rate:  64 PR Interval:  288 QRS Duration: 102 QT Interval:  442 QTC Calculation: 455 R Axis:   3 Text Interpretation:  Sinus rhythm with 1st degree A-V block T wave abnormality Abnormal ekg Confirmed by Carmin Muskrat  MD [phone]) on 06/04/2016 4:53:45 PM      On cardiac monitor the patient has alternating sinus rhythm, with AV paced rhythm Radiology Dg Chest 2 View  Result Date: 06/04/2016 CLINICAL DATA:  Shortness of breath EXAM: CHEST  2 VIEW COMPARISON:  Chest radiograph 07/31/2012 FINDINGS: Left chest wall pacemaker leads are in unchanged position. Cardiomediastinal contours are normal. The lungs are clear. No pleural effusion or pneumothorax. IMPRESSION: No active cardiopulmonary disease. Electronically Signed   By: Ulyses Jarred M.D.   On: 06/04/2016 17:31    Procedures Procedures (including critical care time)  Medications Ordered in ED Medications  0.9 %  sodium chloride infusion (not administered)     Initial Impression / Assessment and Plan / ED Course  I have reviewed the triage vital signs and the nursing notes.  Pertinent labs & imaging results that were available during my care of the patient were reviewed by me and considered in my medical decision making (see chart for details).   Update: On repeat exam the patient is awake and alert, states that he feels okay. Initial findings notable for declining hemoglobin value since 2015. Patient also found to have evidence for dehydration. No evidence for acute kidney  failure, pneumonia, other acute new processes. I discussed all findings including concern for dehydration, anemia with patient and to family members. Patient is unsure of when his last colonoscopy was, will follow-up with gastroenterology to address this. Family also requests additional medication for assistance with the grieving process. After discussion about this, I acquiesced, and the patient will be started on a short course of medication to assist with sleeping during the grieving process. Patient will discuss this with his primary care physician.  Final Clinical Impressions(s) / ED Diagnoses  Weakness   Carmin Muskrat, MD 06/04/16 1810

## 2016-06-04 NOTE — ED Triage Notes (Signed)
Pt c/o progressive weakness in bilateral lower legs. Denies pain. Pt reports feeling SOB with exertion. Pt has pitting edema to BLE and pt reports they are more swollen than normal.

## 2016-06-29 ENCOUNTER — Ambulatory Visit (INDEPENDENT_AMBULATORY_CARE_PROVIDER_SITE_OTHER): Payer: Medicare Other | Admitting: *Deleted

## 2016-06-29 DIAGNOSIS — I480 Paroxysmal atrial fibrillation: Secondary | ICD-10-CM | POA: Diagnosis not present

## 2016-06-29 NOTE — Progress Notes (Signed)
Remote pacemaker transmission.   

## 2016-06-30 ENCOUNTER — Encounter: Payer: Self-pay | Admitting: Cardiology

## 2016-06-30 LAB — CUP PACEART REMOTE DEVICE CHECK
Battery Remaining Percentage: 95.5 %
Battery Voltage: 2.96 V
Brady Statistic AP VP Percent: 64 %
Brady Statistic AP VS Percent: 4.4 %
Brady Statistic AS VS Percent: 21 %
Brady Statistic RA Percent Paced: 66 %
Brady Statistic RV Percent Paced: 74 %
Date Time Interrogation Session: 20180514073845
Implantable Lead Implant Date: 20140606
Implantable Lead Location: 753859
Implantable Pulse Generator Implant Date: 20140606
Lead Channel Impedance Value: 340 Ohm
Lead Channel Pacing Threshold Amplitude: 0.75 V
Lead Channel Pacing Threshold Pulse Width: 0.5 ms
Lead Channel Pacing Threshold Pulse Width: 0.5 ms
Lead Channel Setting Pacing Amplitude: 2 V
Lead Channel Setting Pacing Amplitude: 2 V
Lead Channel Setting Pacing Pulse Width: 0.5 ms
Lead Channel Setting Sensing Sensitivity: 2 mV
MDC IDC LEAD IMPLANT DT: 20140606
MDC IDC LEAD LOCATION: 753860
MDC IDC MSMT BATTERY REMAINING LONGEVITY: 95 mo
MDC IDC MSMT LEADCHNL RA IMPEDANCE VALUE: 330 Ohm
MDC IDC MSMT LEADCHNL RA PACING THRESHOLD AMPLITUDE: 1 V
MDC IDC MSMT LEADCHNL RA SENSING INTR AMPL: 2.4 mV
MDC IDC MSMT LEADCHNL RV SENSING INTR AMPL: 12 mV
MDC IDC STAT BRADY AS VP PERCENT: 9.3 %
Pulse Gen Model: 2210
Pulse Gen Serial Number: 7474449

## 2016-09-15 ENCOUNTER — Emergency Department (HOSPITAL_COMMUNITY)
Admission: EM | Admit: 2016-09-15 | Discharge: 2016-09-15 | Disposition: A | Discharge status: Home / Self Care | Payer: Medicare Other | Attending: Emergency Medicine | Admitting: Emergency Medicine

## 2016-09-15 ENCOUNTER — Encounter (HOSPITAL_COMMUNITY): Payer: Self-pay | Admitting: Emergency Medicine

## 2016-09-15 ENCOUNTER — Emergency Department (HOSPITAL_COMMUNITY): Payer: Medicare Other

## 2016-09-15 DIAGNOSIS — Z87891 Personal history of nicotine dependence: Secondary | ICD-10-CM | POA: Insufficient documentation

## 2016-09-15 DIAGNOSIS — Z8546 Personal history of malignant neoplasm of prostate: Secondary | ICD-10-CM | POA: Insufficient documentation

## 2016-09-15 DIAGNOSIS — Z79899 Other long term (current) drug therapy: Secondary | ICD-10-CM | POA: Insufficient documentation

## 2016-09-15 DIAGNOSIS — R42 Dizziness and giddiness: Secondary | ICD-10-CM | POA: Diagnosis not present

## 2016-09-15 DIAGNOSIS — M6281 Muscle weakness (generalized): Secondary | ICD-10-CM | POA: Diagnosis present

## 2016-09-15 DIAGNOSIS — Z85038 Personal history of other malignant neoplasm of large intestine: Secondary | ICD-10-CM | POA: Diagnosis not present

## 2016-09-15 DIAGNOSIS — I10 Essential (primary) hypertension: Secondary | ICD-10-CM | POA: Insufficient documentation

## 2016-09-15 DIAGNOSIS — R531 Weakness: Secondary | ICD-10-CM

## 2016-09-15 LAB — CBC WITH DIFFERENTIAL/PLATELET
BASOS ABS: 0 10*3/uL (ref 0.0–0.1)
BASOS PCT: 0 %
Eosinophils Absolute: 0.1 10*3/uL (ref 0.0–0.7)
Eosinophils Relative: 2 %
HEMATOCRIT: 27.9 % — AB (ref 39.0–52.0)
Hemoglobin: 8.7 g/dL — ABNORMAL LOW (ref 13.0–17.0)
Lymphocytes Relative: 36 %
Lymphs Abs: 1.8 10*3/uL (ref 0.7–4.0)
MCH: 26 pg (ref 26.0–34.0)
MCHC: 31.2 g/dL (ref 30.0–36.0)
MCV: 83.5 fL (ref 78.0–100.0)
Monocytes Absolute: 0.5 10*3/uL (ref 0.1–1.0)
Monocytes Relative: 10 %
NEUTROS PCT: 52 %
Neutro Abs: 2.6 10*3/uL (ref 1.7–7.7)
PLATELETS: 289 10*3/uL (ref 150–400)
RBC: 3.34 MIL/uL — ABNORMAL LOW (ref 4.22–5.81)
RDW: 16.2 % — AB (ref 11.5–15.5)
WBC: 5 10*3/uL (ref 4.0–10.5)

## 2016-09-15 LAB — COMPREHENSIVE METABOLIC PANEL
ALBUMIN: 3.7 g/dL (ref 3.5–5.0)
ALT: 21 U/L (ref 17–63)
AST: 24 U/L (ref 15–41)
Alkaline Phosphatase: 71 U/L (ref 38–126)
Anion gap: 8 (ref 5–15)
BILIRUBIN TOTAL: 0.5 mg/dL (ref 0.3–1.2)
BUN: 22 mg/dL — AB (ref 6–20)
CHLORIDE: 107 mmol/L (ref 101–111)
CO2: 24 mmol/L (ref 22–32)
Calcium: 9 mg/dL (ref 8.9–10.3)
Creatinine, Ser: 1.34 mg/dL — ABNORMAL HIGH (ref 0.61–1.24)
GFR calc Af Amer: 53 mL/min — ABNORMAL LOW (ref >60)
GFR calc non Af Amer: 46 mL/min — ABNORMAL LOW (ref >60)
GLUCOSE: 137 mg/dL — AB (ref 65–99)
Potassium: 4 mmol/L (ref 3.5–5.1)
Sodium: 139 mmol/L (ref 135–145)
Total Protein: 7.3 g/dL (ref 6.5–8.1)

## 2016-09-15 LAB — URINALYSIS, ROUTINE W REFLEX MICROSCOPIC
BILIRUBIN URINE: NEGATIVE
GLUCOSE, UA: NEGATIVE mg/dL
Hgb urine dipstick: NEGATIVE
KETONES UR: NEGATIVE mg/dL
LEUKOCYTES UA: NEGATIVE
Nitrite: NEGATIVE
PH: 5 (ref 5.0–8.0)
PROTEIN: NEGATIVE mg/dL
Specific Gravity, Urine: 1.014 (ref 1.005–1.030)

## 2016-09-15 LAB — CBG MONITORING, ED: Glucose-Capillary: 131 mg/dL — ABNORMAL HIGH (ref 65–99)

## 2016-09-15 LAB — TROPONIN I: Troponin I: <0.03 ng/mL (ref <0.03)

## 2016-09-15 NOTE — ED Notes (Signed)
Pt ambulated to restroom without assistance - steady gait

## 2016-09-15 NOTE — ED Triage Notes (Signed)
Dizziness/extremity cramping and ble swelling x 2 days and gen weakness.  A/o. ble mild swelling noted.

## 2016-09-15 NOTE — ED Provider Notes (Signed)
[facility] DEPT Provider Note   CSN: [id number] Arrival date & time: 09/15/16  1426     History   Chief Complaint Chief Complaint  Patient presents with  . Dizziness  . Weakness    extremity cramping    HPI Derrick Long is a 83 y.o. male.  HPI Patient presents to the emergency department with generalized weakness and dizziness as well as cramping in his extremities of the past 2 days.  Denies chest pain shortness of breath.  Reports possibly a small amount of swelling in his lower extremities.  Denies abdominal pain.  No dysuria or urinary frequency.  Denies nausea vomiting diarrhea.  No melena or hematochezia.  No chest pain or chest tightness.  Symptoms are mild in severity.  He does spend some time outdoors.  Family is concerned that he may not drink enough fluid.  He reports his appetite is been normal.  No new medications.   Past Medical History:  Diagnosis Date  . Acid reflux   . Ex-cigarette smoker   . Gout   . Hyperlipemia   . Hypertension   . Prostate cancer ([place]) 6.23.2015   SEEDS 20 YEARS AGO  . Prostate disease     Patient Active Problem List   Diagnosis Date Noted  . Paroxysmal atrial fibrillation ([place]) 09/02/2012  . HTN (hypertension) 09/02/2012  . Pacemaker 08/03/2012  . Chest pain 07/31/2012  . Leukocytosis, unspecified 07/31/2012  . ARF (acute renal failure) ([place]) 07/31/2012  . Gout 07/31/2012  . First degree AV block, PR interval 370 ms 07/19/2012  . Chest pain, atypical 07/19/2012  . Acute renal insufficiency 07/19/2012  . Hypokalemia 07/19/2012  . Syncope 07/19/2012  . Bradycardia 07/19/2012  . LUMBAR STRAIN 09/10/2008  . ANEMIA, IRON DEFICIENCY NOS 08/16/2008  . OSTEOARTHRITIS, MODERATE 09/21/2007  . INSOMNIA 06/24/2007  . HYPERLIPIDEMIA 04/05/2007  . DENTAL CARIES 10/08/2006  . LATERAL MENISCUS TEAR, RIGHT 04/19/2006  . MIGRAINE HEADACHE 12/18/2005  . HYPERTENSION 12/18/2005  . GERD 12/18/2005  . RENAL CALCULUS 12/18/2005  .  BENIGN PROSTATIC HYPERTROPHY, WITH OBSTRUCTION 12/18/2005  . ARTHRITIS 12/18/2005  . LOW BACK PAIN 12/18/2005  . OSTEOPENIA 12/18/2005  . COLON CANCER, HX OF 12/18/2005  . PROSTATE CANCER, HX OF 12/18/2005    Past Surgical History:  Procedure Laterality Date  . LEFT HEART CATHETERIZATION WITH CORONARY ANGIOGRAM N/A 07/21/2012   Procedure: LEFT HEART CATHETERIZATION WITH CORONARY ANGIOGRAM;  Surgeon: Troy Sine, MD;  Location: [facility] CATH LAB;  Service: Cardiovascular;  Laterality: N/A;  . PACEMAKER INSERTION    . PERMANENT PACEMAKER INSERTION N/A 07/22/2012   Procedure: PERMANENT PACEMAKER INSERTION;  Surgeon: Sanda Klein, MD;  Location: [facility] CATH LAB;  Service: Cardiovascular;  Laterality: N/A;  . PROSTATE SURGERY         Home Medications    Prior to Admission medications   Medication Sig Start Date End Date Taking? Authorizing Provider  alfuzosin (UROXATRAL) 10 MG 24 hr tablet Take 10 mg by mouth at bedtime.     [provider]  ALPRAZolam Duanne Moron) 0.5 MG tablet Take 1 tablet (0.5 mg total) by mouth at bedtime as needed for sleep. 06/04/16   Carmin Muskrat, MD  amLODipine (NORVASC) 10 MG tablet Take 10 mg by mouth every morning.     [provider]  atorvastatin (LIPITOR) 20 MG tablet Take 1 tablet (20 mg total) by mouth daily at 6 PM. 07/23/12   Consuelo Pandy, PA-C  Cetirizine HCl (ZYRTEC ALLERGY) 10 MG CAPS Take 1  capsule (10 mg total) by mouth daily. 02/15/16   Ward, Delice Bison, DO  colchicine 0.6 MG tablet Take 0.6 mg by mouth 2 (two) times daily as needed (gout).    [provider]  Cyanocobalamin (B-12) 1000 MCG CAPS Take 1 tablet by mouth every morning.     [provider]  diclofenac sodium (VOLTAREN) 1 % GEL Apply 2 g topically 4 (four) times daily.    [provider]  fluticasone (FLONASE) 50 MCG/ACT nasal spray Place 2 sprays into both nostrils daily. 02/15/16   Ward, Delice Bison, DO  furosemide (LASIX) 40 MG tablet Take 40 mg  by mouth every morning.     [provider]  lisinopril (PRINIVIL,ZESTRIL) 40 MG tablet Take 40 mg by mouth daily.    [provider]  magnesium oxide (MAG-OX) 400 MG tablet Take 400 mg by mouth daily.    [provider]  omeprazole (PRILOSEC) 20 MG capsule Take 20 mg by mouth every morning.     [provider]  rivaroxaban (XARELTO) 20 MG TABS tablet Take 1 tablet (20 mg total) by mouth daily with supper. 10/30/13   Croitoru, Mihai, MD  traMADol (ULTRAM) 50 MG tablet Take 50 mg by mouth every 6 (six) hours as needed.    [provider]    Family History History reviewed. No pertinent family history.  Social History Social History  Substance Use Topics  . Smoking status: Former Smoker    Quit date: 07/20/2002  . Smokeless tobacco: Never Used  . Alcohol use No     Allergies   Patient has no known allergies.   Review of Systems Review of Systems  All other systems reviewed and are negative.    Physical Exam Updated Vital Signs BP (!) 159/63 (BP Location: Left Arm)   Pulse 65   Temp 97.9 F (36.6 C) (Oral)   Resp 17   Ht 6\' 1"  (1.854 m)   Wt 85.7 kg (189 lb)   SpO2 95%   BMI 24.94 kg/m   Physical Exam  Constitutional: He is oriented to person, place, and time. He appears well-developed and well-nourished.  HENT:  Head: Normocephalic and atraumatic.  Eyes: EOM are normal.  Neck: Normal range of motion.  Cardiovascular: Normal rate, regular rhythm, normal heart sounds and intact distal pulses.   Pulmonary/Chest: Effort normal and breath sounds normal. No respiratory distress.  Abdominal: Soft. He exhibits no distension. There is no tenderness.  Musculoskeletal: Normal range of motion.  Trace edema bilaterally  Neurological: He is alert and oriented to person, place, and time.  Skin: Skin is warm and dry.  Psychiatric: He has a normal mood and affect. Judgment normal.  Nursing note and vitals reviewed.    ED Treatments  / Results  Labs (all labs ordered are listed, but only abnormal results are displayed) Labs Reviewed  CBC WITH DIFFERENTIAL/PLATELET - Abnormal; Notable for the following:       Result Value   RBC 3.34 (*)    Hemoglobin 8.7 (*)    HCT 27.9 (*)    RDW 16.2 (*)    All other components within normal limits  COMPREHENSIVE METABOLIC PANEL - Abnormal; Notable for the following:    Glucose, Bld 137 (*)    BUN 22 (*)    Creatinine, Ser 1.34 (*)    GFR calc non Af Amer 46 (*)    GFR calc Af Amer 53 (*)    All other components within normal limits  CBG MONITORING, ED - Abnormal; Notable for the following:    Glucose-Capillary 131 (*)    All other components within normal limits  URINALYSIS, ROUTINE W REFLEX MICROSCOPIC  TROPONIN I   Hemoglobin  Date Value Ref Range Status  09/15/2016 8.7 (L) 13.0 - 17.0 g/dL Final  06/04/2016 8.6 (L) 13.0 - 17.0 g/dL Final  07/26/2013 14.0 13.0 - 17.0 g/dL Final  07/31/2012 11.7 (L) 13.0 - 17.0 g/dL Final     EKG  EKG Interpretation  Date/Time:  Tuesday September 15 2016 14:59:18 EDT Ventricular Rate:  61 PR Interval:    QRS Duration: 91 QT Interval:  442 QTC Calculation: 446 R Axis:   62 Text Interpretation:  Sinus rhythm Prolonged PR interval No significant change was found Confirmed by Jola Schmidt [phone]) on 09/15/2016 4:41:12 PM       Radiology Dg Chest 2 View  Result Date: 09/15/2016 CLINICAL DATA:  Dizziness, weakness, lower extremity swelling and cramping for the past 2 days. EXAM: CHEST  2 VIEW COMPARISON:  Chest x-ray of June 04, 2016 FINDINGS: The lungs are well-expanded and clear. The heart and pulmonary vascularity are normal. The mediastinum is normal in width. The ICD is in stable position. There is tortuosity of the ascending and descending thoracic aorta with calcification in the arch. There is no pleural effusion. The bony thorax exhibits no acute abnormality. IMPRESSION: There is no acute cardiopulmonary abnormality. Thoracic  aortic atherosclerosis. Electronically Signed   By: David  Martinique M.D.   On: 09/15/2016 15:44    Procedures Procedures (including critical care time)  Medications Ordered in ED Medications - No data to display   Initial Impression / Assessment and Plan / ED Course  I have reviewed the triage vital signs and the nursing notes.  Pertinent labs & imaging results that were available during my care of the patient were reviewed by me and considered in my medical decision making (see chart for details).     Overall the patient is well-appearing.  Baseline hemoglobin.  Baseline renal insufficiency.  May be mild volume depleted.  Vital signs stable.  Ambulated around the ER without any difficulty.  I personally walked with him.  He had no ataxia or lightheadedness.  No near-syncope or syncope.  No palpitations.  Discharge home in good condition.  Primary care follow-up.  Patient and patient's family given a copy of all of his lab work.  He has family are encouraged to return to the ER for new or worsening symptoms  Final Clinical Impressions(s) / ED Diagnoses   Final diagnoses:  Generalized weakness    New Prescriptions New Prescriptions   No medications on file     Jola Schmidt, MD 09/15/16 1643

## 2016-09-28 ENCOUNTER — Ambulatory Visit (INDEPENDENT_AMBULATORY_CARE_PROVIDER_SITE_OTHER): Payer: Medicare Other | Admitting: *Deleted

## 2016-09-28 DIAGNOSIS — R001 Bradycardia, unspecified: Secondary | ICD-10-CM

## 2016-09-28 DIAGNOSIS — I480 Paroxysmal atrial fibrillation: Secondary | ICD-10-CM

## 2016-09-28 NOTE — Progress Notes (Signed)
Remote pacemaker transmission.   

## 2016-09-29 LAB — CUP PACEART REMOTE DEVICE CHECK
Implantable Lead Implant Date: 20140606
Implantable Lead Location: 753859
Lead Channel Impedance Value: 350 Ohm
Lead Channel Sensing Intrinsic Amplitude: 10.4 mV
Lead Channel Sensing Intrinsic Amplitude: 2.4 mV
Lead Channel Setting Pacing Amplitude: 2 V
Lead Channel Setting Pacing Amplitude: 2 V
Lead Channel Setting Pacing Pulse Width: 0.5 ms
Lead Channel Setting Sensing Sensitivity: 2 mV
MDC IDC LEAD IMPLANT DT: 20140606
MDC IDC LEAD LOCATION: 753860
MDC IDC MSMT LEADCHNL RA IMPEDANCE VALUE: 340 Ohm
MDC IDC PG IMPLANT DT: 20140606
MDC IDC PG SERIAL: 7474449
MDC IDC SESS DTM: 20180814154340
MDC IDC STAT BRADY RA PERCENT PACED: 65 %
MDC IDC STAT BRADY RV PERCENT PACED: 73 %

## 2016-10-08 ENCOUNTER — Ambulatory Visit: Payer: Medicare Other | Admitting: Cardiovascular Disease

## 2016-10-08 ENCOUNTER — Encounter: Payer: Self-pay | Admitting: Cardiology

## 2016-12-28 ENCOUNTER — Ambulatory Visit (INDEPENDENT_AMBULATORY_CARE_PROVIDER_SITE_OTHER): Payer: Medicare Other | Admitting: *Deleted

## 2016-12-28 DIAGNOSIS — I480 Paroxysmal atrial fibrillation: Secondary | ICD-10-CM | POA: Diagnosis not present

## 2016-12-28 DIAGNOSIS — R001 Bradycardia, unspecified: Secondary | ICD-10-CM

## 2016-12-28 NOTE — Progress Notes (Signed)
Remote pacemaker transmission.   

## 2016-12-29 LAB — CUP PACEART REMOTE DEVICE CHECK
Battery Voltage: 2.95 V
Brady Statistic AP VP Percent: 62 %
Brady Statistic RA Percent Paced: 64 %
Date Time Interrogation Session: 20181112084222
Implantable Lead Implant Date: 20140606
Implantable Lead Location: 753859
Implantable Lead Location: 753860
Implantable Pulse Generator Implant Date: 20140606
Lead Channel Impedance Value: 400 Ohm
Lead Channel Pacing Threshold Amplitude: 1 V
Lead Channel Pacing Threshold Pulse Width: 0.5 ms
MDC IDC LEAD IMPLANT DT: 20140606
MDC IDC MSMT BATTERY REMAINING LONGEVITY: 93 mo
MDC IDC MSMT BATTERY REMAINING PERCENTAGE: 91 %
MDC IDC MSMT LEADCHNL RA IMPEDANCE VALUE: 360 Ohm
MDC IDC MSMT LEADCHNL RA SENSING INTR AMPL: 2.9 mV
MDC IDC MSMT LEADCHNL RV PACING THRESHOLD AMPLITUDE: 1 V
MDC IDC MSMT LEADCHNL RV PACING THRESHOLD PULSEWIDTH: 0.5 ms
MDC IDC MSMT LEADCHNL RV SENSING INTR AMPL: 12 mV
MDC IDC SET LEADCHNL RA PACING AMPLITUDE: 2 V
MDC IDC SET LEADCHNL RV PACING AMPLITUDE: 2 V
MDC IDC SET LEADCHNL RV PACING PULSEWIDTH: 0.5 ms
MDC IDC SET LEADCHNL RV SENSING SENSITIVITY: 2 mV
MDC IDC STAT BRADY AP VS PERCENT: 4.3 %
MDC IDC STAT BRADY AS VP PERCENT: 10 %
MDC IDC STAT BRADY AS VS PERCENT: 22 %
MDC IDC STAT BRADY RV PERCENT PACED: 73 %
Pulse Gen Model: 2210
Pulse Gen Serial Number: 7474449

## 2017-01-01 ENCOUNTER — Encounter: Payer: Self-pay | Admitting: Cardiology

## 2017-02-18 ENCOUNTER — Encounter (HOSPITAL_COMMUNITY): Payer: Self-pay

## 2017-02-18 ENCOUNTER — Emergency Department (HOSPITAL_COMMUNITY): Payer: Medicare Other

## 2017-02-18 ENCOUNTER — Emergency Department (HOSPITAL_COMMUNITY)
Admission: EM | Admit: 2017-02-18 | Discharge: 2017-02-18 | Disposition: A | Discharge status: Home / Self Care | Payer: Medicare Other | Attending: Emergency Medicine | Admitting: Emergency Medicine

## 2017-02-18 DIAGNOSIS — Z87891 Personal history of nicotine dependence: Secondary | ICD-10-CM | POA: Insufficient documentation

## 2017-02-18 DIAGNOSIS — Z950 Presence of cardiac pacemaker: Secondary | ICD-10-CM | POA: Insufficient documentation

## 2017-02-18 DIAGNOSIS — Z85038 Personal history of other malignant neoplasm of large intestine: Secondary | ICD-10-CM | POA: Insufficient documentation

## 2017-02-18 DIAGNOSIS — Z7901 Long term (current) use of anticoagulants: Secondary | ICD-10-CM | POA: Insufficient documentation

## 2017-02-18 DIAGNOSIS — Z79899 Other long term (current) drug therapy: Secondary | ICD-10-CM | POA: Insufficient documentation

## 2017-02-18 DIAGNOSIS — M109 Gout, unspecified: Secondary | ICD-10-CM | POA: Insufficient documentation

## 2017-02-18 DIAGNOSIS — Z8546 Personal history of malignant neoplasm of prostate: Secondary | ICD-10-CM | POA: Diagnosis not present

## 2017-02-18 DIAGNOSIS — M79675 Pain in left toe(s): Secondary | ICD-10-CM | POA: Diagnosis present

## 2017-02-18 DIAGNOSIS — I10 Essential (primary) hypertension: Secondary | ICD-10-CM | POA: Insufficient documentation

## 2017-02-18 MED ORDER — DEXAMETHASONE SODIUM PHOSPHATE 10 MG/ML IJ SOLN
10.0000 mg | Freq: Once | INTRAMUSCULAR | Status: AC
  Administered 2017-02-18: 10 mg via INTRAMUSCULAR
  Filled 2017-02-18: qty 1

## 2017-02-18 MED ORDER — COLCHICINE 0.6 MG PO TABS: 0.6000 mg | ORAL_TABLET | Freq: Two times a day (BID) | ORAL | 0 refills | Status: DC | PRN

## 2017-02-18 NOTE — Discharge Instructions (Signed)
Contact a health care provider if: °You have another gout attack. °You continue to have symptoms of a gout attack after10 days of treatment. °You have side effects from your medicines. °You have chills or a fever. °You have burning pain when you urinate. °You have pain in your lower back or belly. °Get help right away if: °You have severe or uncontrolled pain. °You cannot urinate. °

## 2017-02-18 NOTE — ED Notes (Signed)
Patient verbalized understanding of discharge instructions and denies any further needs or questions at this time. VS stable. Patient ambulatory with steady gait. Assisted to ED entrance in wheelchair.   

## 2017-02-18 NOTE — ED Provider Notes (Signed)
[facility] EMERGENCY DEPARTMENT Provider Note   CSN: [id number] Arrival date & time: 02/18/17  1133     History   Chief Complaint Chief Complaint  Patient presents with  . Toe Pain    HPI Derrick Long is a 84 y.o. male with a hx ofg gout brought in by his daughter for evaluation of left toe swelling.  Patient states that this feels like his typical gout flare and he has been out of his medication which is prescribed by the [facility].  He states he called and asked for it however he did not receive it in the mail.  Patient states that he has had some swelling for couple weeks.  He did bang his toe into a chair 2 weeks ago however swelling was already present.  He has pain with movement of the joint, swelling, redness.  He denies fevers or chills.  HPI  Past Medical History:  Diagnosis Date  . Acid reflux   . Ex-cigarette smoker   . Gout   . Hyperlipemia   . Hypertension   . Prostate cancer ([place]) 6.23.2015   SEEDS 20 YEARS AGO  . Prostate disease     Patient Active Problem List   Diagnosis Date Noted  . Paroxysmal atrial fibrillation ([place]) 09/02/2012  . HTN (hypertension) 09/02/2012  . Pacemaker 08/03/2012  . Chest pain 07/31/2012  . Leukocytosis, unspecified 07/31/2012  . ARF (acute renal failure) ([place]) 07/31/2012  . Gout 07/31/2012  . First degree AV block, PR interval 370 ms 07/19/2012  . Chest pain, atypical 07/19/2012  . Acute renal insufficiency 07/19/2012  . Hypokalemia 07/19/2012  . Syncope 07/19/2012  . Bradycardia 07/19/2012  . LUMBAR STRAIN 09/10/2008  . ANEMIA, IRON DEFICIENCY NOS 08/16/2008  . OSTEOARTHRITIS, MODERATE 09/21/2007  . INSOMNIA 06/24/2007  . HYPERLIPIDEMIA 04/05/2007  . DENTAL CARIES 10/08/2006  . LATERAL MENISCUS TEAR, RIGHT 04/19/2006  . MIGRAINE HEADACHE 12/18/2005  . HYPERTENSION 12/18/2005  . GERD 12/18/2005  . RENAL CALCULUS 12/18/2005  . BENIGN PROSTATIC HYPERTROPHY, WITH OBSTRUCTION 12/18/2005  . ARTHRITIS  12/18/2005  . LOW BACK PAIN 12/18/2005  . OSTEOPENIA 12/18/2005  . COLON CANCER, HX OF 12/18/2005  . PROSTATE CANCER, HX OF 12/18/2005    Past Surgical History:  Procedure Laterality Date  . LEFT HEART CATHETERIZATION WITH CORONARY ANGIOGRAM N/A 07/21/2012   Procedure: LEFT HEART CATHETERIZATION WITH CORONARY ANGIOGRAM;  Surgeon: Troy Sine, MD;  Location: [facility] CATH LAB;  Service: Cardiovascular;  Laterality: N/A;  . PACEMAKER INSERTION    . PERMANENT PACEMAKER INSERTION N/A 07/22/2012   Procedure: PERMANENT PACEMAKER INSERTION;  Surgeon: Sanda Klein, MD;  Location: [facility] CATH LAB;  Service: Cardiovascular;  Laterality: N/A;  . PROSTATE SURGERY         Home Medications    Prior to Admission medications   Medication Sig Start Date End Date Taking? Authorizing Provider  alfuzosin (UROXATRAL) 10 MG 24 hr tablet Take 10 mg by mouth at bedtime.     [provider]  ALPRAZolam Duanne Moron) 0.5 MG tablet Take 1 tablet (0.5 mg total) by mouth at bedtime as needed for sleep. 06/04/16   Carmin Muskrat, MD  amLODipine (NORVASC) 10 MG tablet Take 10 mg by mouth every morning.     [provider]  atorvastatin (LIPITOR) 20 MG tablet Take 1 tablet (20 mg total) by mouth daily at 6 PM. Patient taking differently: Take 40 mg by mouth daily at 6 PM.  07/23/12   Consuelo Pandy, PA-C  colchicine 0.6 MG tablet Take 0.6 mg by mouth 2 (two) times daily as needed (gout).    [provider]  Cyanocobalamin (B-12) 1000 MCG CAPS Take 1 tablet by mouth every morning.     [provider]  diclofenac sodium (VOLTAREN) 1 % GEL Apply 2 g topically 4 (four) times daily.    [provider]  fluticasone (FLONASE) 50 MCG/ACT nasal spray Place 2 sprays into both nostrils daily. 02/15/16   Ward, Delice Bison, DO  furosemide (LASIX) 40 MG tablet Take 40 mg by mouth every morning.     [provider]  lisinopril (PRINIVIL,ZESTRIL) 40 MG tablet Take 40 mg by mouth daily.     [provider]  magnesium oxide (MAG-OX) 400 MG tablet Take 400 mg by mouth daily.    [provider]  Melatonin 3 MG CAPS Take 1 capsule by mouth at bedtime.    [provider]  omeprazole (PRILOSEC) 20 MG capsule Take 20 mg by mouth every morning.     [provider]  rivaroxaban (XARELTO) 20 MG TABS tablet Take 1 tablet (20 mg total) by mouth daily with supper. 10/30/13   Croitoru, Mihai, MD  traMADol (ULTRAM) 50 MG tablet Take 50 mg by mouth every 6 (six) hours as needed.    [provider]    Family History No family history on file.  Social History Social History   Tobacco Use  . Smoking status: Former Smoker    Last attempt to quit: 07/20/2002    Years since quitting: 14.5  . Smokeless tobacco: Never Used  Substance Use Topics  . Alcohol use: No  . Drug use: No     Allergies   Patient has no known allergies.   Review of Systems Review of Systems  Ten systems reviewed and are negative for acute change, except as noted in the HPI.   Physical Exam Updated Vital Signs BP (!) 191/67 (BP Location: Right Arm)   Pulse 64   Temp 97.7 F (36.5 C) (Oral)   Resp 16   Ht 6' (1.829 m)   Wt 91.6 kg (202 lb)   SpO2 100%   BMI 27.40 kg/m   Physical Exam  Constitutional: He appears well-developed and well-nourished. No distress.  HENT:  Head: Normocephalic and atraumatic.  Eyes: Conjunctivae are normal. No scleral icterus.  Neck: Normal range of motion. Neck supple.  Cardiovascular: Normal rate, regular rhythm and normal heart sounds.  Pulmonary/Chest: Effort normal and breath sounds normal. No respiratory distress.  Abdominal: Soft. There is no tenderness.  Musculoskeletal: He exhibits no edema.  Right great toe with warmth, swelling and erythema.  Able to move the joint.  Neurological: He is alert.  Skin: Skin is warm and dry. He is not diaphoretic.  Psychiatric: His behavior is normal.  Nursing note and vitals  reviewed.    ED Treatments / Results  Labs (all labs ordered are listed, but only abnormal results are displayed) Labs Reviewed - No data to display  EKG  EKG Interpretation None       Radiology Dg Foot Complete Right  Result Date: 02/18/2017 CLINICAL DATA:  Hit right great toe on bed a couple of weeks ago. Continued redness pain and swelling. EXAM: RIGHT FOOT COMPLETE - 3+ VIEW COMPARISON:  None FINDINGS: Nondisplaced fracture with callus at the fourth middle phalanx. There is a juxta-articular erosion about the medial aspect of the great toe interphalangeal joint. Hazy density medial to this joint and the first  MTP joint. First MTP osteoarthritis. IMPRESSION: 1. Gouty changes about the great toe, including juxta-articular erosion at the interphalangeal joint. 2. Nondisplaced fracture of the fourth middle phalanx with subacute features. Electronically Signed   By: Monte Fantasia M.D.   On: 02/18/2017 13:36    Procedures Procedures (including critical care time)  Medications Ordered in ED Medications - No data to display   Initial Impression / Assessment and Plan / ED Course  I have reviewed the triage vital signs and the nursing notes.  Pertinent labs & imaging results that were available during my care of the patient were reviewed by me and considered in my medical decision making (see chart for details).     Pt presents with monoarticular pain, swelling and erythema.  Pt is afebrile and stable. Imaging reviewed, no evidence of occult fracture or injury. Old fracture noted in another toe.. Pt without known peptic ulcer disease and not receiving concurrent treatment on warfarin. Patient given decadron- dc with colchicine. Discussed that pt should respond to treatment with in 24 hour of begining treatment & likely resolve in 2-3 days.    Final Clinical Impressions(s) / ED Diagnoses   Final diagnoses:  Podagra    ED Discharge Orders    None       Margarita Mail,  PA-C 02/18/17 2358    Malvin Johns, MD 02/19/17 0005

## 2017-02-18 NOTE — ED Notes (Addendum)
Pt statres that he hit his R toe on bed approx 3 weeks ago; pt states that he has hx of Gout; Pt's R great toe is swollen  nad he has +2 edema to R ankle; Pt A/O x 4; Pt states pain is 0/10

## 2017-02-18 NOTE — ED Triage Notes (Signed)
Per Pt, Pt reports that he hit his right toe on the bed a couple weeks ago and it has continue to be swollen and hurt since then.

## 2017-03-29 ENCOUNTER — Ambulatory Visit (INDEPENDENT_AMBULATORY_CARE_PROVIDER_SITE_OTHER): Payer: Medicare Other | Admitting: *Deleted

## 2017-03-29 DIAGNOSIS — I480 Paroxysmal atrial fibrillation: Secondary | ICD-10-CM

## 2017-03-29 NOTE — Progress Notes (Signed)
Remote pacemaker transmission.   

## 2017-03-31 ENCOUNTER — Encounter: Payer: Self-pay | Admitting: Cardiology

## 2017-04-06 LAB — CUP PACEART REMOTE DEVICE CHECK
Battery Remaining Percentage: 91 %
Battery Voltage: 2.95 V
Brady Statistic AP VP Percent: 68 %
Brady Statistic RA Percent Paced: 71 %
Brady Statistic RV Percent Paced: 82 %
Date Time Interrogation Session: 20190211071241
Implantable Lead Implant Date: 20140606
Implantable Lead Implant Date: 20140606
Implantable Pulse Generator Implant Date: 20140606
Lead Channel Impedance Value: 390 Ohm
Lead Channel Pacing Threshold Amplitude: 1 V
Lead Channel Sensing Intrinsic Amplitude: 2.3 mV
Lead Channel Setting Pacing Pulse Width: 0.5 ms
Lead Channel Setting Sensing Sensitivity: 2 mV
MDC IDC LEAD LOCATION: 753859
MDC IDC LEAD LOCATION: 753860
MDC IDC MSMT BATTERY REMAINING LONGEVITY: 88 mo
MDC IDC MSMT LEADCHNL RA IMPEDANCE VALUE: 360 Ohm
MDC IDC MSMT LEADCHNL RA PACING THRESHOLD PULSEWIDTH: 0.5 ms
MDC IDC MSMT LEADCHNL RV PACING THRESHOLD AMPLITUDE: 1.25 V
MDC IDC MSMT LEADCHNL RV PACING THRESHOLD PULSEWIDTH: 0.5 ms
MDC IDC MSMT LEADCHNL RV SENSING INTR AMPL: 10 mV
MDC IDC PG SERIAL: 7474449
MDC IDC SET LEADCHNL RA PACING AMPLITUDE: 2 V
MDC IDC SET LEADCHNL RV PACING AMPLITUDE: 2.5 V
MDC IDC STAT BRADY AP VS PERCENT: 3.6 %
MDC IDC STAT BRADY AS VP PERCENT: 15 %
MDC IDC STAT BRADY AS VS PERCENT: 13 %
Pulse Gen Model: 2210

## 2017-04-08 ENCOUNTER — Encounter: Payer: Self-pay | Admitting: Cardiovascular Disease

## 2017-04-19 ENCOUNTER — Telehealth: Payer: Self-pay

## 2017-04-19 NOTE — Telephone Encounter (Signed)
Spoke with pts granddaughter he was not home at the time informed her that we needed the to call back with the name of the doctor he is seeing at the [facility] in [place] for his pacemaker so we can transition home monitoring over to them. She stated that she would give pt the message.

## 2017-04-23 ENCOUNTER — Telehealth: Payer: Self-pay

## 2017-04-23 NOTE — Telephone Encounter (Signed)
Called [facility] at [facility] spoke with a triage nurse who then gave me extensions to pacemaker clinic [phone], [phone]) tried both extensions an answer and no voicemail to leave a message. Will attempt at later time.

## 2017-04-23 NOTE — Telephone Encounter (Signed)
Spoke with pt he stated that he was getting his pacemaker checked at [facility] with the [facility] in [place], pt gave me number to the clinic [phone]

## 2017-04-26 ENCOUNTER — Telehealth: Payer: Self-pay

## 2017-04-26 NOTE — Telephone Encounter (Signed)
Spoke with Concha Pyo at [facility] clinic and gave Derrick Long information and she said she would let the nurses know to enroll Mr. Daily in the clinic there for [facility] transmissions.

## 2017-05-07 ENCOUNTER — Telehealth: Payer: Self-pay | Admitting: Cardiovascular Disease

## 2017-05-07 NOTE — Telephone Encounter (Signed)
Spoke w/ Mr. Uzelac and he states that he now goes to the [facility] in [place] to have his Pacemaker checked and does not need an appt w/ Dr. Sallyanne Kuster .

## 2017-06-19 ENCOUNTER — Emergency Department (HOSPITAL_COMMUNITY): Payer: Medicare Other

## 2017-06-19 ENCOUNTER — Emergency Department (HOSPITAL_COMMUNITY)
Admission: EM | Admit: 2017-06-19 | Discharge: 2017-06-19 | Disposition: A | Discharge status: Home / Self Care | Payer: Medicare Other | Attending: Emergency Medicine | Admitting: Emergency Medicine

## 2017-06-19 ENCOUNTER — Encounter (HOSPITAL_COMMUNITY): Payer: Self-pay | Admitting: Emergency Medicine

## 2017-06-19 DIAGNOSIS — R55 Syncope and collapse: Secondary | ICD-10-CM | POA: Diagnosis not present

## 2017-06-19 DIAGNOSIS — I712 Thoracic aortic aneurysm, without rupture, unspecified: Secondary | ICD-10-CM

## 2017-06-19 DIAGNOSIS — Z950 Presence of cardiac pacemaker: Secondary | ICD-10-CM | POA: Diagnosis not present

## 2017-06-19 DIAGNOSIS — M436 Torticollis: Secondary | ICD-10-CM | POA: Diagnosis not present

## 2017-06-19 DIAGNOSIS — Z87891 Personal history of nicotine dependence: Secondary | ICD-10-CM | POA: Diagnosis not present

## 2017-06-19 DIAGNOSIS — Z8546 Personal history of malignant neoplasm of prostate: Secondary | ICD-10-CM | POA: Insufficient documentation

## 2017-06-19 DIAGNOSIS — Z7901 Long term (current) use of anticoagulants: Secondary | ICD-10-CM | POA: Insufficient documentation

## 2017-06-19 DIAGNOSIS — M542 Cervicalgia: Secondary | ICD-10-CM | POA: Diagnosis present

## 2017-06-19 DIAGNOSIS — I10 Essential (primary) hypertension: Secondary | ICD-10-CM | POA: Insufficient documentation

## 2017-06-19 DIAGNOSIS — Z79899 Other long term (current) drug therapy: Secondary | ICD-10-CM | POA: Diagnosis not present

## 2017-06-19 LAB — I-STAT CHEM 8, ED
BUN: 7 mg/dL (ref 6–20)
CALCIUM ION: 1.11 mmol/L — AB (ref 1.15–1.40)
CHLORIDE: 97 mmol/L — AB (ref 101–111)
Creatinine, Ser: 1 mg/dL (ref 0.61–1.24)
GLUCOSE: 107 mg/dL — AB (ref 65–99)
HCT: 39 % (ref 39.0–52.0)
HEMOGLOBIN: 13.3 g/dL (ref 13.0–17.0)
Potassium: 3.3 mmol/L — ABNORMAL LOW (ref 3.5–5.1)
Sodium: 140 mmol/L (ref 135–145)
TCO2: 29 mmol/L (ref 22–32)

## 2017-06-19 MED ORDER — DICLOFENAC SODIUM 1 % TD GEL: 2.0000 g | Freq: Four times a day (QID) | TRANSDERMAL | 0 refills | Status: AC

## 2017-06-19 MED ORDER — DIAZEPAM 5 MG PO TABS
2.5000 mg | ORAL_TABLET | Freq: Once | ORAL | Status: AC
  Administered 2017-06-19: 2.5 mg via ORAL
  Filled 2017-06-19: qty 1

## 2017-06-19 MED ORDER — IOPAMIDOL (ISOVUE-370) INJECTION 76%
100.0000 mL | Freq: Once | INTRAVENOUS | Status: AC | PRN
  Administered 2017-06-19: 80 mL via INTRAVENOUS

## 2017-06-19 MED ORDER — IBUPROFEN 400 MG PO TABS
400.0000 mg | ORAL_TABLET | Freq: Once | ORAL | Status: AC
  Administered 2017-06-19: 400 mg via ORAL
  Filled 2017-06-19: qty 1

## 2017-06-19 MED ORDER — METHOCARBAMOL 500 MG PO TABS: 500.0000 mg | ORAL_TABLET | Freq: Three times a day (TID) | ORAL | 0 refills | Status: DC | PRN

## 2017-06-19 NOTE — ED Provider Notes (Signed)
[facility] EMERGENCY DEPARTMENT Provider Note   CSN: [id number] Arrival date & time: 06/19/17  2703     History   Chief Complaint Chief Complaint  Patient presents with  . Neck Pain    HPI Derrick Long is a 84 y.o. male.  Patient reports left neck and trapezius pain that has been coming and going since yesterday morning when he woke up.  He denies any falls or injury.  States the pain waxes and wanes in severity but never goes away completely.  He has been rubbing alcohol on it at home.  It is worse when he turns his head to the left.  He denies any weakness in his arms or numbness or tingling.  No chest pain or shortness of breath.  Denies any headache or vision change.  Patient does take Xarelto for history of atrial fibrillation and has a pacemaker.  He denies any lifting injury.  The history is provided by the patient.  Neck Pain   Pertinent negatives include no chest pain, no headaches and no weakness.    Past Medical History:  Diagnosis Date  . Acid reflux   . Ex-cigarette smoker   . Gout   . Hyperlipemia   . Hypertension   . Prostate cancer ([place]) 6.23.2015   SEEDS 20 YEARS AGO  . Prostate disease     Patient Active Problem List   Diagnosis Date Noted  . Paroxysmal atrial fibrillation ([place]) 09/02/2012  . HTN (hypertension) 09/02/2012  . Pacemaker 08/03/2012  . Chest pain 07/31/2012  . Leukocytosis, unspecified 07/31/2012  . ARF (acute renal failure) ([place]) 07/31/2012  . Gout 07/31/2012  . First degree AV block, PR interval 370 ms 07/19/2012  . Chest pain, atypical 07/19/2012  . Acute renal insufficiency 07/19/2012  . Hypokalemia 07/19/2012  . Syncope 07/19/2012  . Bradycardia 07/19/2012  . LUMBAR STRAIN 09/10/2008  . ANEMIA, IRON DEFICIENCY NOS 08/16/2008  . OSTEOARTHRITIS, MODERATE 09/21/2007  . INSOMNIA 06/24/2007  . HYPERLIPIDEMIA 04/05/2007  . DENTAL CARIES 10/08/2006  . LATERAL MENISCUS TEAR, RIGHT 04/19/2006  . MIGRAINE HEADACHE 12/18/2005    . HYPERTENSION 12/18/2005  . GERD 12/18/2005  . RENAL CALCULUS 12/18/2005  . BENIGN PROSTATIC HYPERTROPHY, WITH OBSTRUCTION 12/18/2005  . ARTHRITIS 12/18/2005  . LOW BACK PAIN 12/18/2005  . OSTEOPENIA 12/18/2005  . COLON CANCER, HX OF 12/18/2005  . PROSTATE CANCER, HX OF 12/18/2005    Past Surgical History:  Procedure Laterality Date  . LEFT HEART CATHETERIZATION WITH CORONARY ANGIOGRAM N/A 07/21/2012   Procedure: LEFT HEART CATHETERIZATION WITH CORONARY ANGIOGRAM;  Surgeon: Troy Sine, MD;  Location: [facility] CATH LAB;  Service: Cardiovascular;  Laterality: N/A;  . PACEMAKER INSERTION    . PERMANENT PACEMAKER INSERTION N/A 07/22/2012   Procedure: PERMANENT PACEMAKER INSERTION;  Surgeon: Sanda Klein, MD;  Location: [facility] CATH LAB;  Service: Cardiovascular;  Laterality: N/A;  . PROSTATE SURGERY          Home Medications    Prior to Admission medications   Medication Sig Start Date End Date Taking? Authorizing Provider  alfuzosin (UROXATRAL) 10 MG 24 hr tablet Take 10 mg by mouth at bedtime.     [provider]  ALPRAZolam Duanne Moron) 0.5 MG tablet Take 1 tablet (0.5 mg total) by mouth at bedtime as needed for sleep. 06/04/16   Carmin Muskrat, MD  amLODipine (NORVASC) 10 MG tablet Take 10 mg by mouth every morning.     [provider]  atorvastatin (LIPITOR) 20 MG tablet Take 1 tablet (  20 mg total) by mouth daily at 6 PM. Patient taking differently: Take 40 mg by mouth daily at 6 PM.  07/23/12   Consuelo Pandy, PA-C  colchicine 0.6 MG tablet Take 1 tablet (0.6 mg total) by mouth 2 (two) times daily as needed (gout). 02/18/17   Harris, Abigail, PA-C  Cyanocobalamin (B-12) 1000 MCG CAPS Take 1 tablet by mouth every morning.     [provider]  diclofenac sodium (VOLTAREN) 1 % GEL Apply 2 g topically 4 (four) times daily.    [provider]  fluticasone (FLONASE) 50 MCG/ACT nasal spray Place 2 sprays into both nostrils daily. 02/15/16   Ward, Delice Bison,  DO  furosemide (LASIX) 40 MG tablet Take 40 mg by mouth every morning.     [provider]  lisinopril (PRINIVIL,ZESTRIL) 40 MG tablet Take 40 mg by mouth daily.    [provider]  magnesium oxide (MAG-OX) 400 MG tablet Take 400 mg by mouth daily.    [provider]  Melatonin 3 MG CAPS Take 1 capsule by mouth at bedtime.    [provider]  omeprazole (PRILOSEC) 20 MG capsule Take 20 mg by mouth every morning.     [provider]  rivaroxaban (XARELTO) 20 MG TABS tablet Take 1 tablet (20 mg total) by mouth daily with supper. 10/30/13   Croitoru, Mihai, MD  traMADol (ULTRAM) 50 MG tablet Take 50 mg by mouth every 6 (six) hours as needed.    [provider]    Family History No family history on file.  Social History Social History   Tobacco Use  . Smoking status: Former Smoker    Last attempt to quit: 07/20/2002    Years since quitting: 14.9  . Smokeless tobacco: Never Used  Substance Use Topics  . Alcohol use: No  . Drug use: No     Allergies   Patient has no known allergies.   Review of Systems Review of Systems  Constitutional: Negative for activity change, appetite change and fever.  HENT: Negative for congestion and rhinorrhea.   Eyes: Negative for visual disturbance.  Respiratory: Negative for cough, chest tightness and shortness of breath.   Cardiovascular: Negative for chest pain.  Gastrointestinal: Negative for abdominal pain, nausea and vomiting.  Genitourinary: Negative for difficulty urinating, dysuria and hematuria.  Musculoskeletal: Positive for arthralgias, myalgias and neck pain.  Skin: Negative for wound.  Neurological: Negative for dizziness, weakness and headaches.    all other systems are negative except as noted in the HPI and PMH.    Physical Exam Updated Vital Signs BP (!) 179/75 (BP Location: Right Arm)   Pulse 61   Temp 98.5 F (36.9 C) (Oral)   Resp 16   Wt 90.7 kg (200 lb)   SpO2 93%    BMI 27.12 kg/m   Physical Exam  Constitutional: He is oriented to person, place, and time. He appears well-developed and well-nourished. No distress.  HENT:  Head: Normocephalic and atraumatic.  Mouth/Throat: Oropharynx is clear and moist. No oropharyngeal exudate.  Eyes: Pupils are equal, round, and reactive to light. Conjunctivae and EOM are normal.  Neck: Neck supple.  Left posterior paraspinal cervical muscle tenderness worse with palpation.  No midline tenderness Tenderness extends along upper left trapezius Pain with range of motion of neck but no meningismus  Cardiovascular: Normal rate, regular rhythm, normal heart sounds and intact distal pulses.  No murmur heard. Pulmonary/Chest: Effort normal and breath sounds normal. No respiratory distress.  Abdominal: Soft. There is no tenderness. There is no rebound and no guarding.  Musculoskeletal: Normal range of motion. He exhibits no edema or tenderness.  Equal grip strength and radial pulses.   Movement of left arm does not make neck pain worse  Neurological: He is alert and oriented to person, place, and time. No cranial nerve deficit. He exhibits normal muscle tone. Coordination normal.  No ataxia on finger to nose bilaterally. No pronator drift. 5/5 strength throughout. CN 2-12 intact.Equal grip strength. Sensation intact.   Skin: Skin is warm.  Psychiatric: He has a normal mood and affect. His behavior is normal.  Nursing note and vitals reviewed.    ED Treatments / Results  Labs (all labs ordered are listed, but only abnormal results are displayed) Labs Reviewed  I-STAT CHEM 8, ED - Abnormal; Notable for the following components:      Result Value   Potassium 3.3 (*)    Chloride 97 (*)    Glucose, Bld 107 (*)    Calcium, Ion 1.11 (*)    All other components within normal limits    EKG EKG Interpretation  Date/Time:  Saturday Jun 19 2017 06:06:18 EDT Ventricular Rate:  60 PR Interval:    QRS Duration: 192 QT  Interval:  529 QTC Calculation: 529 R Axis:   -83 Text Interpretation:  Atrial-ventricular dual-paced rhythm No further analysis attempted due to paced rhythm paced Confirmed by Ezequiel Essex [phone]) on 06/19/2017 6:18:35 AM   Radiology Ct Angio Head W Or Wo Contrast  Result Date: 06/19/2017 CLINICAL DATA:  84 y/o M; neck pain that started yesterday morning with pain when turning head. EXAM: CT ANGIOGRAPHY HEAD AND NECK TECHNIQUE: Multidetector CT imaging of the head and neck was performed using the standard protocol during bolus administration of intravenous contrast. Multiplanar CT image reconstructions and MIPs were obtained to evaluate the vascular anatomy. Carotid stenosis measurements (when applicable) are obtained utilizing NASCET criteria, using the distal internal carotid diameter as the denominator. CONTRAST:  73mL ISOVUE-370 IOPAMIDOL (ISOVUE-370) INJECTION 76% COMPARISON:  None. FINDINGS: CT HEAD FINDINGS Brain: No evidence of acute infarction, hemorrhage, hydrocephalus, extra-axial collection or mass lesion/mass effect. Mild chronic microvascular ischemic changes and parenchymal volume loss of the brain. Vascular: As below. Skull: Normal. Negative for fracture or focal lesion. Sinuses: Partial opacification of the ethmoid air cells and bilateral maxillary sinus fluid levels. Normal aeration of the mastoid air cells. Bilateral intra-ocular lens replacement. Orbits: No acute finding. Review of the MIP images confirms the above findings CTA NECK FINDINGS Aortic arch: Standard branching. 4.7 cm ascending aortic aneurysm. Mild calcific atherosclerosis. Right carotid system: No evidence of dissection, stenosis (50% or greater) or occlusion. Moderate calcific atherosclerosis with mild less than 50% proximal ICA stenosis. Left carotid system: No evidence of dissection, stenosis (50% or greater) or occlusion. Moderate calcific atherosclerosis of carotid bifurcation with minimal less than 30% ICA  stenosis. Vertebral arteries: Left dominant. No evidence of dissection, stenosis (50% or greater) or occlusion. Skeleton: Cervical spondylosis with advanced discogenic degenerative changes at the C5-C7 levels and multilevel facet arthropathy. No high-grade bony canal stenosis. Uncovertebral and facet hypertrophy encroaches on the neural foramen bilaterally at multiple levels. Other neck: Mildly enlarged right upper paratracheal lymph node measuring 13 x 18 mm, likely reactive. Upper chest: Negative. Review of the MIP images confirms the above findings CTA HEAD FINDINGS Anterior circulation: No significant stenosis, proximal occlusion, aneurysm, or vascular malformation. The calcific atherosclerosis of carotid siphons without significant stenosis. Posterior circulation: No  significant stenosis, proximal occlusion, aneurysm, or vascular malformation. Venous sinuses: As permitted by contrast timing, patent. Anatomic variants: No communicating artery identified, likely hypoplastic or absent. Delayed phase: No abnormal intracranial enhancement. Review of the MIP images confirms the above findings IMPRESSION: CT head: 1. No acute intracranial abnormality identified. 2. Mild chronic microvascular ischemic changes of the brain. 3. Paranasal sinus disease with maxillary fluid levels which may represent acute sinusitis. CTA neck: 1. No dissection, aneurysm, or hemodynamically significant stenosis by NASCET criteria. 2. 4.7 cm ascending aortic aneurysm. Ascending thoracic aortic aneurysm. Recommend semi-annual imaging followup by CTA or MRA and referral to cardiothoracic surgery if not already obtained. This recommendation follows 2010 ACCF/AHA/AATS/ACR/ASA/SCA/SCAI/SIR/STS/SVM Guidelines for the Diagnosis and Management of Patients With Thoracic Aortic Disease. Circulation. 2010; 121: S341-D622. 3. Calcific atherosclerosis of the aorta and bilateral carotid bifurcations. 4. Advanced spondylosis of the cervical spine greatest  at C5-C7. CTA head: Patent anterior and posterior intracranial circulation. No large vessel occlusion, aneurysm, or significant stenosis is identified. Electronically Signed   By: Kristine Garbe M.D.   On: 06/19/2017 05:45   Ct Angio Neck W And/or Wo Contrast  Result Date: 06/19/2017 CLINICAL DATA:  84 y/o M; neck pain that started yesterday morning with pain when turning head. EXAM: CT ANGIOGRAPHY HEAD AND NECK TECHNIQUE: Multidetector CT imaging of the head and neck was performed using the standard protocol during bolus administration of intravenous contrast. Multiplanar CT image reconstructions and MIPs were obtained to evaluate the vascular anatomy. Carotid stenosis measurements (when applicable) are obtained utilizing NASCET criteria, using the distal internal carotid diameter as the denominator. CONTRAST:  9mL ISOVUE-370 IOPAMIDOL (ISOVUE-370) INJECTION 76% COMPARISON:  None. FINDINGS: CT HEAD FINDINGS Brain: No evidence of acute infarction, hemorrhage, hydrocephalus, extra-axial collection or mass lesion/mass effect. Mild chronic microvascular ischemic changes and parenchymal volume loss of the brain. Vascular: As below. Skull: Normal. Negative for fracture or focal lesion. Sinuses: Partial opacification of the ethmoid air cells and bilateral maxillary sinus fluid levels. Normal aeration of the mastoid air cells. Bilateral intra-ocular lens replacement. Orbits: No acute finding. Review of the MIP images confirms the above findings CTA NECK FINDINGS Aortic arch: Standard branching. 4.7 cm ascending aortic aneurysm. Mild calcific atherosclerosis. Right carotid system: No evidence of dissection, stenosis (50% or greater) or occlusion. Moderate calcific atherosclerosis with mild less than 50% proximal ICA stenosis. Left carotid system: No evidence of dissection, stenosis (50% or greater) or occlusion. Moderate calcific atherosclerosis of carotid bifurcation with minimal less than 30% ICA stenosis.  Vertebral arteries: Left dominant. No evidence of dissection, stenosis (50% or greater) or occlusion. Skeleton: Cervical spondylosis with advanced discogenic degenerative changes at the C5-C7 levels and multilevel facet arthropathy. No high-grade bony canal stenosis. Uncovertebral and facet hypertrophy encroaches on the neural foramen bilaterally at multiple levels. Other neck: Mildly enlarged right upper paratracheal lymph node measuring 13 x 18 mm, likely reactive. Upper chest: Negative. Review of the MIP images confirms the above findings CTA HEAD FINDINGS Anterior circulation: No significant stenosis, proximal occlusion, aneurysm, or vascular malformation. The calcific atherosclerosis of carotid siphons without significant stenosis. Posterior circulation: No significant stenosis, proximal occlusion, aneurysm, or vascular malformation. Venous sinuses: As permitted by contrast timing, patent. Anatomic variants: No communicating artery identified, likely hypoplastic or absent. Delayed phase: No abnormal intracranial enhancement. Review of the MIP images confirms the above findings IMPRESSION: CT head: 1. No acute intracranial abnormality identified. 2. Mild chronic microvascular ischemic changes of the brain. 3. Paranasal sinus disease with maxillary fluid levels  which may represent acute sinusitis. CTA neck: 1. No dissection, aneurysm, or hemodynamically significant stenosis by NASCET criteria. 2. 4.7 cm ascending aortic aneurysm. Ascending thoracic aortic aneurysm. Recommend semi-annual imaging followup by CTA or MRA and referral to cardiothoracic surgery if not already obtained. This recommendation follows 2010 ACCF/AHA/AATS/ACR/ASA/SCA/SCAI/SIR/STS/SVM Guidelines for the Diagnosis and Management of Patients With Thoracic Aortic Disease. Circulation. 2010; 121: G500-B704. 3. Calcific atherosclerosis of the aorta and bilateral carotid bifurcations. 4. Advanced spondylosis of the cervical spine greatest at C5-C7.  CTA head: Patent anterior and posterior intracranial circulation. No large vessel occlusion, aneurysm, or significant stenosis is identified. Electronically Signed   By: Kristine Garbe M.D.   On: 06/19/2017 05:45    Procedures Procedures (including critical care time)  Medications Ordered in ED Medications  diazepam (VALIUM) tablet 2.5 mg (has no administration in time range)  ibuprofen (ADVIL,MOTRIN) tablet 400 mg (has no administration in time range)     Initial Impression / Assessment and Plan / ED Course  I have reviewed the triage vital signs and the nursing notes.  Pertinent labs & imaging results that were available during my care of the patient were reviewed by me and considered in my medical decision making (see chart for details).    Patient with 2 days of atraumatic left-sided neck pain and trapezius pain.  Worse with movement.  No strength or sensory deficits.  Suspect muscle spasm.  Will treat with muscle relaxers and anti-inflammatories.  Given patient's hypertension anticoagulation use as well as age will evaluate carotid and vertebral arteries.  CT angiogram is reassuring but does show incidental aneurysm of thoracic aorta.  Patient's pain has resolved after treatment.  He and his family are informed of the thoracic aorta aneurysm and need for semiannual follow-up.  He will be referred to thoracic surgery.  This is unlikely the source of his neck pain.  He denies any chest pain or back pain.  Patient will be treated for muscle spasm of neck.  He states his pain has resolved.  Follow-up with PCP and thoracic surgery.  Return precautions discussed.  Final Clinical Impressions(s) / ED Diagnoses   Final diagnoses:  Torticollis, acute  Thoracic aortic aneurysm without rupture [facility])    ED Discharge Orders    None       Ezequiel Essex, MD 06/19/17 [phone]

## 2017-06-19 NOTE — Discharge Instructions (Signed)
Take anti-inflammatories and muscle relaxers as prescribed.  You have a dilated ascending aorta seen on CT scan.  This should be monitored every 6 months to make sure it is not getting bigger he should follow-up with the thoracic surgeon to discuss possible repair options. Return to the ED if you develop chest pain, back pain, shortness of breath or any other concerns.

## 2017-06-19 NOTE — ED Triage Notes (Signed)
Pt C/O neck pain that started yesterday morning. Pt denies fevers. Pt states turning his head left to right hurts worse.

## 2017-06-19 NOTE — ED Notes (Signed)
Pt returned from CT Scan 

## 2017-06-19 NOTE — ED Notes (Signed)
Patient transported to CT 

## 2017-06-28 ENCOUNTER — Ambulatory Visit: Payer: Medicare Other | Admitting: *Deleted

## 2017-06-28 NOTE — Progress Notes (Signed)
Remote pacemaker transmission.   

## 2017-06-29 NOTE — Progress Notes (Signed)
Not received  

## 2018-11-05 ENCOUNTER — Emergency Department (HOSPITAL_COMMUNITY)
Admission: EM | Admit: 2018-11-05 | Discharge: 2018-11-05 | Disposition: A | Discharge status: Home / Self Care | Payer: Medicare Other | Attending: Emergency Medicine | Admitting: Emergency Medicine

## 2018-11-05 ENCOUNTER — Encounter (HOSPITAL_COMMUNITY): Payer: Self-pay | Admitting: Emergency Medicine

## 2018-11-05 ENCOUNTER — Other Ambulatory Visit: Payer: Self-pay

## 2018-11-05 DIAGNOSIS — Z7901 Long term (current) use of anticoagulants: Secondary | ICD-10-CM | POA: Diagnosis not present

## 2018-11-05 DIAGNOSIS — Z8546 Personal history of malignant neoplasm of prostate: Secondary | ICD-10-CM | POA: Diagnosis not present

## 2018-11-05 DIAGNOSIS — R7989 Other specified abnormal findings of blood chemistry: Secondary | ICD-10-CM

## 2018-11-05 DIAGNOSIS — Z950 Presence of cardiac pacemaker: Secondary | ICD-10-CM | POA: Insufficient documentation

## 2018-11-05 DIAGNOSIS — I10 Essential (primary) hypertension: Secondary | ICD-10-CM | POA: Insufficient documentation

## 2018-11-05 DIAGNOSIS — Z79899 Other long term (current) drug therapy: Secondary | ICD-10-CM | POA: Insufficient documentation

## 2018-11-05 DIAGNOSIS — R5383 Other fatigue: Secondary | ICD-10-CM | POA: Diagnosis present

## 2018-11-05 DIAGNOSIS — R945 Abnormal results of liver function studies: Secondary | ICD-10-CM | POA: Insufficient documentation

## 2018-11-05 DIAGNOSIS — Z87891 Personal history of nicotine dependence: Secondary | ICD-10-CM | POA: Diagnosis not present

## 2018-11-05 DIAGNOSIS — R531 Weakness: Secondary | ICD-10-CM | POA: Insufficient documentation

## 2018-11-05 LAB — CBC
HCT: 38.6 % — ABNORMAL LOW (ref 39.0–52.0)
Hemoglobin: 12.9 g/dL — ABNORMAL LOW (ref 13.0–17.0)
MCH: 33.5 pg (ref 26.0–34.0)
MCHC: 33.4 g/dL (ref 30.0–36.0)
MCV: 100.3 fL — ABNORMAL HIGH (ref 80.0–100.0)
Platelets: 191 10*3/uL (ref 150–400)
RBC: 3.85 MIL/uL — ABNORMAL LOW (ref 4.22–5.81)
RDW: 12.9 % (ref 11.5–15.5)
WBC: 4.9 10*3/uL (ref 4.0–10.5)
nRBC: 0 % (ref 0.0–0.2)

## 2018-11-05 LAB — PHOSPHORUS: Phosphorus: 3.1 mg/dL (ref 2.5–4.6)

## 2018-11-05 LAB — BASIC METABOLIC PANEL
Anion gap: 10 (ref 5–15)
BUN: 16 mg/dL (ref 8–23)
CO2: 26 mmol/L (ref 22–32)
Calcium: 9.4 mg/dL (ref 8.9–10.3)
Chloride: 102 mmol/L (ref 98–111)
Creatinine, Ser: 1.26 mg/dL — ABNORMAL HIGH (ref 0.61–1.24)
GFR calc Af Amer: 58 mL/min — ABNORMAL LOW (ref >60)
GFR calc non Af Amer: 50 mL/min — ABNORMAL LOW (ref >60)
Glucose, Bld: 109 mg/dL — ABNORMAL HIGH (ref 70–99)
Potassium: 4.1 mmol/L (ref 3.5–5.1)
Sodium: 138 mmol/L (ref 135–145)

## 2018-11-05 LAB — HEPATIC FUNCTION PANEL
ALT: 126 U/L — ABNORMAL HIGH (ref 0–44)
AST: 116 U/L — ABNORMAL HIGH (ref 15–41)
Albumin: 3.8 g/dL (ref 3.5–5.0)
Alkaline Phosphatase: 479 U/L — ABNORMAL HIGH (ref 38–126)
Bilirubin, Direct: <0.1 mg/dL (ref 0.0–0.2)
Total Bilirubin: 0.2 mg/dL — ABNORMAL LOW (ref 0.3–1.2)
Total Protein: 8.1 g/dL (ref 6.5–8.1)

## 2018-11-05 LAB — URINALYSIS, ROUTINE W REFLEX MICROSCOPIC
Bilirubin Urine: NEGATIVE
Glucose, UA: NEGATIVE mg/dL
Hgb urine dipstick: NEGATIVE
Ketones, ur: NEGATIVE mg/dL
Leukocytes,Ua: NEGATIVE
Nitrite: NEGATIVE
Protein, ur: NEGATIVE mg/dL
Specific Gravity, Urine: 1.009 (ref 1.005–1.030)
pH: 5 (ref 5.0–8.0)

## 2018-11-05 LAB — MAGNESIUM: Magnesium: 2.9 mg/dL — ABNORMAL HIGH (ref 1.7–2.4)

## 2018-11-05 MED ORDER — SODIUM CHLORIDE 0.9 % IV BOLUS
250.0000 mL | Freq: Once | INTRAVENOUS | Status: AC
  Administered 2018-11-05: 250 mL via INTRAVENOUS

## 2018-11-05 NOTE — ED Triage Notes (Signed)
Weakness ,dizziness, lose stools and urinary frequency for past 2 days.

## 2018-11-05 NOTE — ED Provider Notes (Signed)
[facility] EMERGENCY DEPARTMENT Provider Note   CSN: [id number] Arrival date & time: 11/05/18  1227     History   Chief Complaint Chief Complaint  Patient presents with  . Fatigue    HPI Derrick Long is a 85 y.o. male presenting for evaluation of generalized weakness.  Patient states for the past 2 days, he has been feeling generally weak.  He also reports urinary frequency, but denies dysuria or hematuria.  Patient states in the past several weeks, he has been having postprandial diarrhea, it is worse in the morning but present after eating anything.  He has had this evaluated by his doctor at the [place], but nothing was done.  He denies history of similar.  He denies sick contacts.  He denies fevers, chills, chest pain, shortness of breath, cough, abdominal pain.  He denies blood in his stools.  He has not taken anything for his symptoms.  Additional history obtained from chart review.  Patient with a history of hyperlipidemia, hypertension, paroxysmal A. fib on anticoagulation, BPH, CHF, CKD.     HPI  Past Medical History:  Diagnosis Date  . Acid reflux   . Ex-cigarette smoker   . Gout   . Hyperlipemia   . Hypertension   . Prostate cancer ([place]) 6.23.2015   SEEDS 20 YEARS AGO  . Prostate disease     Patient Active Problem List   Diagnosis Date Noted  . Paroxysmal atrial fibrillation ([place]) 09/02/2012  . HTN (hypertension) 09/02/2012  . Pacemaker 08/03/2012  . Chest pain 07/31/2012  . Leukocytosis, unspecified 07/31/2012  . ARF (acute renal failure) ([place]) 07/31/2012  . Gout 07/31/2012  . First degree AV block, PR interval 370 ms 07/19/2012  . Chest pain, atypical 07/19/2012  . Acute renal insufficiency 07/19/2012  . Hypokalemia 07/19/2012  . Syncope 07/19/2012  . Bradycardia 07/19/2012  . LUMBAR STRAIN 09/10/2008  . ANEMIA, IRON DEFICIENCY NOS 08/16/2008  . OSTEOARTHRITIS, MODERATE 09/21/2007  . INSOMNIA 06/24/2007  . HYPERLIPIDEMIA 04/05/2007  . DENTAL  CARIES 10/08/2006  . LATERAL MENISCUS TEAR, RIGHT 04/19/2006  . MIGRAINE HEADACHE 12/18/2005  . HYPERTENSION 12/18/2005  . GERD 12/18/2005  . RENAL CALCULUS 12/18/2005  . BENIGN PROSTATIC HYPERTROPHY, WITH OBSTRUCTION 12/18/2005  . ARTHRITIS 12/18/2005  . LOW BACK PAIN 12/18/2005  . OSTEOPENIA 12/18/2005  . COLON CANCER, HX OF 12/18/2005  . PROSTATE CANCER, HX OF 12/18/2005    Past Surgical History:  Procedure Laterality Date  . LEFT HEART CATHETERIZATION WITH CORONARY ANGIOGRAM N/A 07/21/2012   Procedure: LEFT HEART CATHETERIZATION WITH CORONARY ANGIOGRAM;  Surgeon: Troy Sine, MD;  Location: [facility] CATH LAB;  Service: Cardiovascular;  Laterality: N/A;  . PACEMAKER INSERTION    . PERMANENT PACEMAKER INSERTION N/A 07/22/2012   Procedure: PERMANENT PACEMAKER INSERTION;  Surgeon: Sanda Klein, MD;  Location: [place] CATH LAB;  Service: Cardiovascular;  Laterality: N/A;  . PROSTATE SURGERY          Home Medications    Prior to Admission medications   Medication Sig Start Date End Date Taking? Authorizing Provider  alfuzosin (UROXATRAL) 10 MG 24 hr tablet Take 10 mg by mouth at bedtime.    Yes [provider]  ALPRAZolam (XANAX) 0.5 MG tablet Take 1 tablet (0.5 mg total) by mouth at bedtime as needed for sleep. 06/04/16  Yes Carmin Muskrat, MD  amLODipine (NORVASC) 10 MG tablet Take 10 mg by mouth every morning.    Yes [provider]  atorvastatin (LIPITOR) 20 MG tablet Take 1  tablet (20 mg total) by mouth daily at 6 PM. Patient taking differently: Take 40 mg by mouth daily at 6 PM.  07/23/12  Yes Lyda Jester M, PA-C  colchicine 0.6 MG tablet Take 1 tablet (0.6 mg total) by mouth 2 (two) times daily as needed (gout). 02/18/17  Yes Harris, Abigail, PA-C  Cyanocobalamin (B-12) 1000 MCG CAPS Take 1 tablet by mouth every morning.    Yes [provider]  diclofenac sodium (VOLTAREN) 1 % GEL Apply 2 g topically 4 (four) times daily. 06/19/17  Yes Rancour, Annie Main,  MD  fluticasone (FLONASE) 50 MCG/ACT nasal spray Place 2 sprays into both nostrils daily. 02/15/16  Yes Ward, Delice Bison, DO  furosemide (LASIX) 40 MG tablet Take 40 mg by mouth every morning.    Yes [provider]  lisinopril (PRINIVIL,ZESTRIL) 40 MG tablet Take 40 mg by mouth daily.   Yes [provider]  magnesium oxide (MAG-OX) 400 MG tablet Take 400 mg by mouth daily.   Yes [provider]  Melatonin 3 MG CAPS Take 1 capsule by mouth at bedtime.   Yes [provider]  methocarbamol (ROBAXIN) 500 MG tablet Take 1 tablet (500 mg total) by mouth every 8 (eight) hours as needed for muscle spasms. 06/19/17  Yes Rancour, Annie Main, MD  omeprazole (PRILOSEC) 20 MG capsule Take 20 mg by mouth every morning.    Yes [provider]  rivaroxaban (XARELTO) 20 MG TABS tablet Take 1 tablet (20 mg total) by mouth daily with supper. 10/30/13  Yes Croitoru, Mihai, MD  traMADol (ULTRAM) 50 MG tablet Take 50 mg by mouth every 6 (six) hours as needed.   Yes [provider]    Family History No family history on file.  Social History Social History   Tobacco Use  . Smoking status: Former Smoker    Quit date: 07/20/2002    Years since quitting: 16.3  . Smokeless tobacco: Never Used  Substance Use Topics  . Alcohol use: No  . Drug use: No     Allergies   Patient has no known allergies.   Review of Systems Review of Systems  Gastrointestinal: Positive for diarrhea.  Genitourinary: Positive for frequency.  Neurological: Positive for weakness.  All other systems reviewed and are negative.    Physical Exam Updated Vital Signs BP (!) 153/83 (BP Location: Left Arm)   Pulse 61   Temp 98.4 F (36.9 C) (Oral)   Resp 18   Ht 6' (1.829 m)   Wt 91.6 kg   SpO2 97%   BMI 27.40 kg/m   Physical Exam Vitals signs and nursing note reviewed.  Constitutional:      General: He is not in acute distress.    Appearance: He is well-developed.      Comments: Resting comfortably in bed in no acute distress  HENT:     Head: Normocephalic and atraumatic.  Eyes:     Conjunctiva/sclera: Conjunctivae normal.     Pupils: Pupils are equal, round, and reactive to light.  Neck:     Musculoskeletal: Normal range of motion and neck supple.  Cardiovascular:     Rate and Rhythm: Normal rate and regular rhythm.  Pulmonary:     Effort: Pulmonary effort is normal. No respiratory distress.     Breath sounds: Normal breath sounds. No wheezing.  Abdominal:     General: There is no distension.     Palpations: Abdomen is soft. There is no mass.     Tenderness:  There is no abdominal tenderness. There is no guarding or rebound.  Musculoskeletal: Normal range of motion.     Comments: Palpation of right low back musculature.  No pain over midline spine or left side low back.  No CVA tenderness. Strength and sensation intact x4.  Patient is ambulatory without difficulty.  Skin:    General: Skin is warm and dry.     Capillary Refill: Capillary refill takes less than 2 seconds.  Neurological:     Mental Status: He is alert and oriented to person, place, and time.      ED Treatments / Results  Labs (all labs ordered are listed, but only abnormal results are displayed) Labs Reviewed  BASIC METABOLIC PANEL - Abnormal; Notable for the following components:      Result Value   Glucose, Bld 109 (*)    Creatinine, Ser 1.26 (*)    GFR calc non Af Amer 50 (*)    GFR calc Af Amer 58 (*)    All other components within normal limits  CBC - Abnormal; Notable for the following components:   RBC 3.85 (*)    Hemoglobin 12.9 (*)    HCT 38.6 (*)    MCV 100.3 (*)    All other components within normal limits  HEPATIC FUNCTION PANEL - Abnormal; Notable for the following components:   AST 116 (*)    ALT 126 (*)    Alkaline Phosphatase 479 (*)    Total Bilirubin 0.2 (*)    All other components within normal limits  MAGNESIUM - Abnormal; Notable for the  following components:   Magnesium 2.9 (*)    All other components within normal limits  URINALYSIS, ROUTINE W REFLEX MICROSCOPIC  PHOSPHORUS    EKG EKG Interpretation  Date/Time:  Saturday November 05 2018 19:36:39 EDT Ventricular Rate:  63 PR Interval:    QRS Duration: 213 QT Interval:  539 QTC Calculation: 552 R Axis:   -80 Text Interpretation:  AV dual-paced rhythm Since last tracing of earlier today No significant change was found Confirmed by Daleen Bo [phone]) on 11/05/2018 7:45:04 PM   Radiology No results found.  Procedures Procedures (including critical care time)  Medications Ordered in ED Medications  sodium chloride 0.9 % bolus 250 mL (0 mLs Intravenous Stopped 11/05/18 1951)     Initial Impression / Assessment and Plan / ED Course  I have reviewed the triage vital signs and the nursing notes.  Pertinent labs & imaging results that were available during my care of the patient were reviewed by me and considered in my medical decision making (see chart for details).        Patient presenting for evaluation of generalized weakness.  Physical examination, appears nontoxic.  Patient reports several weeks of postprandial diarrhea, likely contributing to his generalized weakness.  Patient also reporting urinary frequency, as such we will assess for UTI.  Basic labs ordered including electrolytes.  Patient has had several weeks of postprandial diarrhea, will obtain CT to rule out colitis versus other intestinal issue. Small amount of fluid for likely dehydration.   Patient and daughter state patient would rather have the scan done through the [place], they do not believe he needs it today.  As patient is without fever or surgical abdomen, I am agreeable to holding off on the CT scan.  Labs overall reassuring, no leukocytosis.  Electrolytes stable, in fact magnesium is slightly high.  Creatinine at baseline.  Patient's liver enzymes are elevated.  I discussed  this with  patient and daughter, daughter states they have been elevated recently since he has been evaluated at the [place], this is not new.  No known cause for liver elevation at this time.  On reassessment after fluids, patient reports weakness is improved.  He remains nontoxic in appearance.  Urine without infection.  Case discussed with attending, Dr. Eulis Foster evaluated the patient.  At this time, patient appears safe for discharge.  [facility] stressed [facility]  Return precautions given.  Patient and family state they understand and agree to plan.  Final Clinical Impressions(s) / ED Diagnoses   Final diagnoses:  Generalized weakness  Elevated LFTs    ED Discharge Orders    None       Franchot Heidelberg, PA-C 11/06/18 0014    Daleen Bo, MD 11/06/18 2344

## 2018-11-05 NOTE — Discharge Instructions (Addendum)
Make sure you are staying well-hydrated water. Continue taking home medications as prescribed. It is important that you follow-up with your doctor for further evaluation of your liver. Return to the emergency room with any new, worsening, concerning symptoms.  Return if you develop high fevers, severe abdominal pain, persistent vomiting.

## 2018-11-05 NOTE — ED Provider Notes (Signed)
  Face-to-face evaluation   History: He presents for evaluation of weakness, dizziness, loose bowel movements and urinary frequency for several days.  Physical exam: Alert, calm and cooperative.  Back with mild right lumbar tenderness, no spine tenderness.  Abdomen soft nontender.  No palpable bladder mass.  Patient is lucid, calm and cooperative.  Medical screening examination/treatment/procedure(s) were conducted as a shared visit with non-physician practitioner(s) and myself.  I personally evaluated the patient during the encounter    Daleen Bo, MD 11/06/18 2344

## 2019-01-05 ENCOUNTER — Emergency Department (HOSPITAL_COMMUNITY)
Admission: EM | Admit: 2019-01-05 | Discharge: 2019-01-05 | Disposition: A | Discharge status: Home / Self Care | Payer: No Typology Code available for payment source | Attending: Emergency Medicine | Admitting: Emergency Medicine

## 2019-01-05 ENCOUNTER — Encounter (HOSPITAL_COMMUNITY): Payer: Self-pay

## 2019-01-05 ENCOUNTER — Other Ambulatory Visit: Payer: Self-pay

## 2019-01-05 DIAGNOSIS — Z950 Presence of cardiac pacemaker: Secondary | ICD-10-CM | POA: Insufficient documentation

## 2019-01-05 DIAGNOSIS — K921 Melena: Secondary | ICD-10-CM | POA: Diagnosis present

## 2019-01-05 DIAGNOSIS — Z79899 Other long term (current) drug therapy: Secondary | ICD-10-CM | POA: Diagnosis not present

## 2019-01-05 DIAGNOSIS — I10 Essential (primary) hypertension: Secondary | ICD-10-CM | POA: Diagnosis not present

## 2019-01-05 DIAGNOSIS — Z87891 Personal history of nicotine dependence: Secondary | ICD-10-CM | POA: Insufficient documentation

## 2019-01-05 HISTORY — DX: Anemia, unspecified: D64.9

## 2019-01-05 LAB — COMPREHENSIVE METABOLIC PANEL
ALT: 19 U/L (ref 0–44)
AST: 22 U/L (ref 15–41)
Albumin: 4 g/dL (ref 3.5–5.0)
Alkaline Phosphatase: 64 U/L (ref 38–126)
Anion gap: 10 (ref 5–15)
BUN: 26 mg/dL — ABNORMAL HIGH (ref 8–23)
CO2: 23 mmol/L (ref 22–32)
Calcium: 9.4 mg/dL (ref 8.9–10.3)
Chloride: 105 mmol/L (ref 98–111)
Creatinine, Ser: 1.47 mg/dL — ABNORMAL HIGH (ref 0.61–1.24)
GFR calc Af Amer: 48 mL/min — ABNORMAL LOW (ref >60)
GFR calc non Af Amer: 41 mL/min — ABNORMAL LOW (ref >60)
Glucose, Bld: 127 mg/dL — ABNORMAL HIGH (ref 70–99)
Potassium: 3.4 mmol/L — ABNORMAL LOW (ref 3.5–5.1)
Sodium: 138 mmol/L (ref 135–145)
Total Bilirubin: 1.1 mg/dL (ref 0.3–1.2)
Total Protein: 7.7 g/dL (ref 6.5–8.1)

## 2019-01-05 LAB — CBC WITH DIFFERENTIAL/PLATELET
Abs Immature Granulocytes: 0.01 10*3/uL (ref 0.00–0.07)
Basophils Absolute: 0 10*3/uL (ref 0.0–0.1)
Basophils Relative: 1 %
Eosinophils Absolute: 0.1 10*3/uL (ref 0.0–0.5)
Eosinophils Relative: 2 %
HCT: 38.3 % — ABNORMAL LOW (ref 39.0–52.0)
Hemoglobin: 12.5 g/dL — ABNORMAL LOW (ref 13.0–17.0)
Immature Granulocytes: 0 %
Lymphocytes Relative: 32 %
Lymphs Abs: 1.5 10*3/uL (ref 0.7–4.0)
MCH: 33.2 pg (ref 26.0–34.0)
MCHC: 32.6 g/dL (ref 30.0–36.0)
MCV: 101.9 fL — ABNORMAL HIGH (ref 80.0–100.0)
Monocytes Absolute: 0.4 10*3/uL (ref 0.1–1.0)
Monocytes Relative: 9 %
Neutro Abs: 2.6 10*3/uL (ref 1.7–7.7)
Neutrophils Relative %: 56 %
Platelets: 225 10*3/uL (ref 150–400)
RBC: 3.76 MIL/uL — ABNORMAL LOW (ref 4.22–5.81)
RDW: 13.2 % (ref 11.5–15.5)
WBC: 4.7 10*3/uL (ref 4.0–10.5)
nRBC: 0 % (ref 0.0–0.2)

## 2019-01-05 LAB — POC OCCULT BLOOD, ED: Fecal Occult Bld: NEGATIVE

## 2019-01-05 NOTE — ED Provider Notes (Signed)
[facility] EMERGENCY DEPARTMENT Provider Note   CSN: [id number] Arrival date & time: 01/05/19  0920     History   Chief Complaint Chief Complaint  Patient presents with  . Blood In Stools    HPI Derrick Long is a 85 y.o. male.     Patient complains of black stools the last few days.  He is taking iron.   Weakness Severity:  Moderate Onset quality:  Sudden Timing:  Constant Progression:  Worsening Chronicity:  New Context: not alcohol use   Relieved by:  Nothing Worsened by:  Nothing Ineffective treatments:  None tried Associated symptoms: no abdominal pain, no chest pain, no cough, no diarrhea, no frequency, no headaches and no seizures     Past Medical History:  Diagnosis Date  . Acid reflux   . Anemia   . Ex-cigarette smoker   . Gout   . Hyperlipemia   . Hypertension   . Prostate cancer ([place]) 6.23.2015   SEEDS 20 YEARS AGO  . Prostate disease     Patient Active Problem List   Diagnosis Date Noted  . Paroxysmal atrial fibrillation ([place]) 09/02/2012  . HTN (hypertension) 09/02/2012  . Pacemaker 08/03/2012  . Chest pain 07/31/2012  . Leukocytosis, unspecified 07/31/2012  . ARF (acute renal failure) ([place]) 07/31/2012  . Gout 07/31/2012  . First degree AV block, PR interval 370 ms 07/19/2012  . Chest pain, atypical 07/19/2012  . Acute renal insufficiency 07/19/2012  . Hypokalemia 07/19/2012  . Syncope 07/19/2012  . Bradycardia 07/19/2012  . LUMBAR STRAIN 09/10/2008  . ANEMIA, IRON DEFICIENCY NOS 08/16/2008  . OSTEOARTHRITIS, MODERATE 09/21/2007  . INSOMNIA 06/24/2007  . HYPERLIPIDEMIA 04/05/2007  . DENTAL CARIES 10/08/2006  . LATERAL MENISCUS TEAR, RIGHT 04/19/2006  . MIGRAINE HEADACHE 12/18/2005  . HYPERTENSION 12/18/2005  . GERD 12/18/2005  . RENAL CALCULUS 12/18/2005  . BENIGN PROSTATIC HYPERTROPHY, WITH OBSTRUCTION 12/18/2005  . ARTHRITIS 12/18/2005  . LOW BACK PAIN 12/18/2005  . OSTEOPENIA 12/18/2005  . COLON CANCER, HX OF  12/18/2005  . PROSTATE CANCER, HX OF 12/18/2005    Past Surgical History:  Procedure Laterality Date  . LEFT HEART CATHETERIZATION WITH CORONARY ANGIOGRAM N/A 07/21/2012   Procedure: LEFT HEART CATHETERIZATION WITH CORONARY ANGIOGRAM;  Surgeon: Troy Sine, MD;  Location: [facility] CATH LAB;  Service: Cardiovascular;  Laterality: N/A;  . PACEMAKER INSERTION    . PERMANENT PACEMAKER INSERTION N/A 07/22/2012   Procedure: PERMANENT PACEMAKER INSERTION;  Surgeon: Sanda Klein, MD;  Location: [place] CATH LAB;  Service: Cardiovascular;  Laterality: N/A;  . PROSTATE SURGERY          Home Medications    Prior to Admission medications   Medication Sig Start Date End Date Taking? Authorizing Provider  alfuzosin (UROXATRAL) 10 MG 24 hr tablet Take 10 mg by mouth at bedtime.     [provider]  ALPRAZolam Duanne Moron) 0.5 MG tablet Take 1 tablet (0.5 mg total) by mouth at bedtime as needed for sleep. 06/04/16   Carmin Muskrat, MD  amLODipine (NORVASC) 10 MG tablet Take 10 mg by mouth every morning.     [provider]  atorvastatin (LIPITOR) 20 MG tablet Take 1 tablet (20 mg total) by mouth daily at 6 PM. Patient taking differently: Take 40 mg by mouth daily at 6 PM.  07/23/12   Consuelo Pandy, PA-C  colchicine 0.6 MG tablet Take 1 tablet (0.6 mg total) by mouth 2 (two) times daily as needed (gout). 02/18/17   Margarita Mail, PA-C  Cyanocobalamin (B-12) 1000 MCG CAPS Take 1 tablet by mouth every morning.     [provider]  diclofenac sodium (VOLTAREN) 1 % GEL Apply 2 g topically 4 (four) times daily. 06/19/17   Rancour, Annie Main, MD  fluticasone (FLONASE) 50 MCG/ACT nasal spray Place 2 sprays into both nostrils daily. 02/15/16   Ward, Delice Bison, DO  furosemide (LASIX) 40 MG tablet Take 40 mg by mouth every morning.     [provider]  lisinopril (PRINIVIL,ZESTRIL) 40 MG tablet Take 40 mg by mouth daily.    [provider]  magnesium oxide (MAG-OX) 400 MG tablet  Take 400 mg by mouth daily.    [provider]  Melatonin 3 MG CAPS Take 1 capsule by mouth at bedtime.    [provider]  methocarbamol (ROBAXIN) 500 MG tablet Take 1 tablet (500 mg total) by mouth every 8 (eight) hours as needed for muscle spasms. 06/19/17   Rancour, Annie Main, MD  omeprazole (PRILOSEC) 20 MG capsule Take 20 mg by mouth every morning.     [provider]  rivaroxaban (XARELTO) 20 MG TABS tablet Take 1 tablet (20 mg total) by mouth daily with supper. 10/30/13   Croitoru, Mihai, MD  traMADol (ULTRAM) 50 MG tablet Take 50 mg by mouth every 6 (six) hours as needed.    [provider]    Family History No family history on file.  Social History Social History   Tobacco Use  . Smoking status: Former Smoker    Quit date: 07/20/2002    Years since quitting: 16.4  . Smokeless tobacco: Never Used  Substance Use Topics  . Alcohol use: No  . Drug use: No     Allergies   Patient has no known allergies.   Review of Systems Review of Systems  Constitutional: Negative for appetite change and fatigue.  HENT: Negative for congestion, ear discharge and sinus pressure.   Eyes: Negative for discharge.  Respiratory: Negative for cough.   Cardiovascular: Negative for chest pain.  Gastrointestinal: Negative for abdominal pain and diarrhea.       Black stools  Genitourinary: Negative for frequency and hematuria.  Musculoskeletal: Negative for back pain.  Skin: Negative for rash.  Neurological: Negative for seizures and headaches.  Psychiatric/Behavioral: Negative for hallucinations.     Physical Exam Updated Vital Signs BP 137/85 (BP Location: Right Arm)   Pulse 88   Temp 97.7 F (36.5 C) (Oral)   Resp 16   Ht 6' (1.829 m)   Wt 90.7 kg   SpO2 100%   BMI 27.12 kg/m   Physical Exam Vitals signs and nursing note reviewed.  Constitutional:      Appearance: Normal appearance. He is well-developed.  HENT:     Head: Normocephalic.      Nose: Nose normal.  Eyes:     General: No scleral icterus.    Conjunctiva/sclera: Conjunctivae normal.  Neck:     Musculoskeletal: Neck supple.     Thyroid: No thyromegaly.  Cardiovascular:     Rate and Rhythm: Normal rate and regular rhythm.     Pulses: Normal pulses.  Pulmonary:     Effort: Pulmonary effort is normal.  Abdominal:     General: There is no distension.  Musculoskeletal: Normal range of motion.  Skin:    Findings: No erythema or rash.  Neurological:     Mental Status: He is alert and oriented to person, place, and time.     Motor: No abnormal muscle  tone.     Coordination: Coordination normal.  Psychiatric:        Behavior: Behavior normal.      ED Treatments / Results  Labs (all labs ordered are listed, but only abnormal results are displayed) Labs Reviewed  CBC WITH DIFFERENTIAL/PLATELET - Abnormal; Notable for the following components:      Result Value   RBC 3.76 (*)    Hemoglobin 12.5 (*)    HCT 38.3 (*)    MCV 101.9 (*)    All other components within normal limits  COMPREHENSIVE METABOLIC PANEL - Abnormal; Notable for the following components:   Potassium 3.4 (*)    Glucose, Bld 127 (*)    BUN 26 (*)    Creatinine, Ser 1.47 (*)    GFR calc non Af Amer 41 (*)    GFR calc Af Amer 48 (*)    All other components within normal limits  POC OCCULT BLOOD, ED    EKG None  Radiology No results found.  Procedures Procedures (including critical care time)  Medications Ordered in ED Medications - No data to display   Initial Impression / Assessment and Plan / ED Course  I have reviewed the triage vital signs and the nursing notes.  Pertinent labs & imaging results that were available during my care of the patient were reviewed by me and considered in my medical decision making (see chart for details).   Rectal exam with heme-negative.  Labs unremarkable.  Black stools probably related to iron is taken.  He will follow-up with PCP        Final Clinical Impressions(s) / ED Diagnoses   Final diagnoses:  Black stools    ED Discharge Orders    None       Milton Ferguson, MD 01/05/19 1251

## 2019-01-05 NOTE — Discharge Instructions (Addendum)
Follow-up with your doctor next week if any problem

## 2019-01-05 NOTE — ED Triage Notes (Addendum)
Pt reports dark stools for 5 days. Occasional abdominal pain.  Pt reports he started taking iron about 3 weeks ago

## 2019-06-20 ENCOUNTER — Other Ambulatory Visit: Payer: Self-pay

## 2019-06-20 ENCOUNTER — Emergency Department (HOSPITAL_COMMUNITY): Payer: No Typology Code available for payment source

## 2019-06-20 ENCOUNTER — Encounter (HOSPITAL_COMMUNITY): Payer: Self-pay | Admitting: *Deleted

## 2019-06-20 ENCOUNTER — Emergency Department (HOSPITAL_COMMUNITY)
Admission: EM | Admit: 2019-06-20 | Discharge: 2019-06-20 | Disposition: A | Discharge status: Home / Self Care | Payer: No Typology Code available for payment source | Attending: Emergency Medicine | Admitting: Emergency Medicine

## 2019-06-20 DIAGNOSIS — Z7901 Long term (current) use of anticoagulants: Secondary | ICD-10-CM | POA: Diagnosis not present

## 2019-06-20 DIAGNOSIS — I10 Essential (primary) hypertension: Secondary | ICD-10-CM | POA: Diagnosis not present

## 2019-06-20 DIAGNOSIS — Z85038 Personal history of other malignant neoplasm of large intestine: Secondary | ICD-10-CM | POA: Insufficient documentation

## 2019-06-20 DIAGNOSIS — M79662 Pain in left lower leg: Secondary | ICD-10-CM | POA: Diagnosis not present

## 2019-06-20 DIAGNOSIS — Z87891 Personal history of nicotine dependence: Secondary | ICD-10-CM | POA: Diagnosis not present

## 2019-06-20 DIAGNOSIS — Z79899 Other long term (current) drug therapy: Secondary | ICD-10-CM | POA: Insufficient documentation

## 2019-06-20 DIAGNOSIS — M7122 Synovial cyst of popliteal space [Baker], left knee: Secondary | ICD-10-CM | POA: Diagnosis not present

## 2019-06-20 DIAGNOSIS — R2242 Localized swelling, mass and lump, left lower limb: Secondary | ICD-10-CM | POA: Diagnosis not present

## 2019-06-20 DIAGNOSIS — Z950 Presence of cardiac pacemaker: Secondary | ICD-10-CM | POA: Insufficient documentation

## 2019-06-20 DIAGNOSIS — Z8546 Personal history of malignant neoplasm of prostate: Secondary | ICD-10-CM | POA: Insufficient documentation

## 2019-06-20 LAB — CBC WITH DIFFERENTIAL/PLATELET
Abs Immature Granulocytes: 0.01 10*3/uL (ref 0.00–0.07)
Basophils Absolute: 0 10*3/uL (ref 0.0–0.1)
Basophils Relative: 0 %
Eosinophils Absolute: 0.1 10*3/uL (ref 0.0–0.5)
Eosinophils Relative: 2 %
HCT: 36.8 % — ABNORMAL LOW (ref 39.0–52.0)
Hemoglobin: 12 g/dL — ABNORMAL LOW (ref 13.0–17.0)
Immature Granulocytes: 0 %
Lymphocytes Relative: 35 %
Lymphs Abs: 1.6 10*3/uL (ref 0.7–4.0)
MCH: 32.5 pg (ref 26.0–34.0)
MCHC: 32.6 g/dL (ref 30.0–36.0)
MCV: 99.7 fL (ref 80.0–100.0)
Monocytes Absolute: 0.6 10*3/uL (ref 0.1–1.0)
Monocytes Relative: 13 %
Neutro Abs: 2.3 10*3/uL (ref 1.7–7.7)
Neutrophils Relative %: 50 %
Platelets: 219 10*3/uL (ref 150–400)
RBC: 3.69 MIL/uL — ABNORMAL LOW (ref 4.22–5.81)
RDW: 13.3 % (ref 11.5–15.5)
WBC: 4.7 10*3/uL (ref 4.0–10.5)
nRBC: 0 % (ref 0.0–0.2)

## 2019-06-20 LAB — BASIC METABOLIC PANEL
Anion gap: 9 (ref 5–15)
BUN: 17 mg/dL (ref 8–23)
CO2: 27 mmol/L (ref 22–32)
Calcium: 8.6 mg/dL — ABNORMAL LOW (ref 8.9–10.3)
Chloride: 104 mmol/L (ref 98–111)
Creatinine, Ser: 1.27 mg/dL — ABNORMAL HIGH (ref 0.61–1.24)
GFR calc Af Amer: 57 mL/min — ABNORMAL LOW (ref >60)
GFR calc non Af Amer: 49 mL/min — ABNORMAL LOW (ref >60)
Glucose, Bld: 97 mg/dL (ref 70–99)
Potassium: 4 mmol/L (ref 3.5–5.1)
Sodium: 140 mmol/L (ref 135–145)

## 2019-06-20 NOTE — ED Provider Notes (Signed)
[facility] EMERGENCY DEPARTMENT Provider Note   CSN: [id number] Arrival date & time: 06/20/19  1059     History Chief Complaint  Patient presents with  . Leg Swelling    Derrick Long is a 86 y.o. male.  HPI      Derrick Long is a 86 y.o. male with a history significant for HTN, anemia, paroxysmal atrial fibrillation and cardiac pacemaker.  He presents to the Emergency Department complaining of left lower leg pain and swelling that began yesterday.  States that he noticed some pain and swelling of his calf after a twisting movement of left leg.  Denies fall, redness or numbness of the extremity.  No fever, chills, skin lesions, chest pain or shortness of breath.  He takes Xarelto daily since his pacemaker placement.    PCP:  [facility]   Past Medical History:  Diagnosis Date  . Acid reflux   . Anemia   . Ex-cigarette smoker   . Gout   . Hyperlipemia   . Hypertension   . Prostate cancer ([place]) 6.23.2015   SEEDS 20 YEARS AGO  . Prostate disease     Patient Active Problem List   Diagnosis Date Noted  . Paroxysmal atrial fibrillation ([place]) 09/02/2012  . HTN (hypertension) 09/02/2012  . Pacemaker 08/03/2012  . Chest pain 07/31/2012  . Leukocytosis, unspecified 07/31/2012  . ARF (acute renal failure) ([place]) 07/31/2012  . Gout 07/31/2012  . First degree AV block, PR interval 370 ms 07/19/2012  . Chest pain, atypical 07/19/2012  . Acute renal insufficiency 07/19/2012  . Hypokalemia 07/19/2012  . Syncope 07/19/2012  . Bradycardia 07/19/2012  . LUMBAR STRAIN 09/10/2008  . ANEMIA, IRON DEFICIENCY NOS 08/16/2008  . OSTEOARTHRITIS, MODERATE 09/21/2007  . INSOMNIA 06/24/2007  . HYPERLIPIDEMIA 04/05/2007  . DENTAL CARIES 10/08/2006  . LATERAL MENISCUS TEAR, RIGHT 04/19/2006  . MIGRAINE HEADACHE 12/18/2005  . HYPERTENSION 12/18/2005  . GERD 12/18/2005  . RENAL CALCULUS 12/18/2005  . BENIGN PROSTATIC HYPERTROPHY, WITH OBSTRUCTION 12/18/2005  .  ARTHRITIS 12/18/2005  . LOW BACK PAIN 12/18/2005  . OSTEOPENIA 12/18/2005  . COLON CANCER, HX OF 12/18/2005  . PROSTATE CANCER, HX OF 12/18/2005    Past Surgical History:  Procedure Laterality Date  . LEFT HEART CATHETERIZATION WITH CORONARY ANGIOGRAM N/A 07/21/2012   Procedure: LEFT HEART CATHETERIZATION WITH CORONARY ANGIOGRAM;  Surgeon: Troy Sine, MD;  Location: [facility] CATH LAB;  Service: Cardiovascular;  Laterality: N/A;  . PACEMAKER INSERTION    . PERMANENT PACEMAKER INSERTION N/A 07/22/2012   Procedure: PERMANENT PACEMAKER INSERTION;  Surgeon: Sanda Klein, MD;  Location: [facility] CATH LAB;  Service: Cardiovascular;  Laterality: N/A;  . PROSTATE SURGERY         No family history on file.  Social History   Tobacco Use  . Smoking status: Former Smoker    Quit date: 07/20/2002    Years since quitting: 16.9  . Smokeless tobacco: Never Used  Substance Use Topics  . Alcohol use: No  . Drug use: No    Home Medications Prior to Admission medications   Medication Sig Start Date End Date Taking? Authorizing Provider  alfuzosin (UROXATRAL) 10 MG 24 hr tablet Take 10 mg by mouth at bedtime.     [provider]  ALPRAZolam Duanne Moron) 0.5 MG tablet Take 1 tablet (0.5 mg total) by mouth at bedtime as needed for sleep. 06/04/16   Carmin Muskrat, MD  amLODipine (NORVASC) 10 MG tablet Take 10 mg by mouth every morning.  [provider]  atorvastatin (LIPITOR) 20 MG tablet Take 1 tablet (20 mg total) by mouth daily at 6 PM. Patient taking differently: Take 40 mg by mouth daily at 6 PM.  07/23/12   Consuelo Pandy, PA-C  colchicine 0.6 MG tablet Take 1 tablet (0.6 mg total) by mouth 2 (two) times daily as needed (gout). 02/18/17   Harris, Abigail, PA-C  Cyanocobalamin (B-12) 1000 MCG CAPS Take 1 tablet by mouth every morning.     [provider]  diclofenac sodium (VOLTAREN) 1 % GEL Apply 2 g topically 4 (four) times daily. 06/19/17   Rancour, Annie Main, MD  fluticasone  (FLONASE) 50 MCG/ACT nasal spray Place 2 sprays into both nostrils daily. 02/15/16   Ward, Delice Bison, DO  furosemide (LASIX) 40 MG tablet Take 40 mg by mouth every morning.     [provider]  lisinopril (PRINIVIL,ZESTRIL) 40 MG tablet Take 40 mg by mouth daily.    [provider]  magnesium oxide (MAG-OX) 400 MG tablet Take 400 mg by mouth daily.    [provider]  Melatonin 3 MG CAPS Take 1 capsule by mouth at bedtime.    [provider]  methocarbamol (ROBAXIN) 500 MG tablet Take 1 tablet (500 mg total) by mouth every 8 (eight) hours as needed for muscle spasms. 06/19/17   Rancour, Annie Main, MD  omeprazole (PRILOSEC) 20 MG capsule Take 20 mg by mouth every morning.     [provider]  rivaroxaban (XARELTO) 20 MG TABS tablet Take 1 tablet (20 mg total) by mouth daily with supper. 10/30/13   Croitoru, Mihai, MD  traMADol (ULTRAM) 50 MG tablet Take 50 mg by mouth every 6 (six) hours as needed.    [provider]    Allergies    Patient has no known allergies.  Review of Systems   Review of Systems  Constitutional: Negative for chills and fever.  Respiratory: Negative for chest tightness and shortness of breath.   Cardiovascular: Negative for chest pain.  Gastrointestinal: Negative for abdominal pain, nausea and vomiting.  Genitourinary: Negative for dysuria.  Musculoskeletal: Positive for arthralgias and myalgias (left lower leg pain and swelling). Negative for joint swelling.  Skin: Negative for color change and wound.  Neurological: Negative for dizziness, syncope, weakness and headaches.    Physical Exam Updated Vital Signs BP (!) 152/81 (BP Location: Left Arm)   Pulse 61   Temp 98 F (36.7 C) (Oral)   Resp 18   Ht 6' (1.829 m)   Wt 99.8 kg   SpO2 98%   BMI 29.84 kg/m   Physical Exam Vitals and nursing note reviewed.  Constitutional:      Appearance: Normal appearance. He is not ill-appearing or toxic-appearing.   Cardiovascular:     Rate and Rhythm: Normal rate and regular rhythm.     Pulses: Normal pulses.  Pulmonary:     Effort: Pulmonary effort is normal.     Breath sounds: Normal breath sounds.  Chest:     Chest wall: No tenderness.  Abdominal:     General: There is no distension.     Palpations: Abdomen is soft.     Tenderness: There is no abdominal tenderness.  Musculoskeletal:        General: Swelling and tenderness present.     Comments: Mild edema of the left lower leg.  There is some tenderness posteriorly without palpable cord.  No excessive warmth or erythema.    Skin:    General:  Skin is warm.     Capillary Refill: Capillary refill takes less than 2 seconds.     Findings: No erythema, lesion or rash.  Neurological:     General: No focal deficit present.     Mental Status: He is alert.     Sensory: No sensory deficit.     Motor: No weakness.     ED Results / Procedures / Treatments   Labs (all labs ordered are listed, but only abnormal results are displayed) Labs Reviewed  CBC WITH DIFFERENTIAL/PLATELET - Abnormal; Notable for the following components:      Result Value   RBC 3.69 (*)    Hemoglobin 12.0 (*)    HCT 36.8 (*)    All other components within normal limits  BASIC METABOLIC PANEL - Abnormal; Notable for the following components:   Creatinine, Ser 1.27 (*)    Calcium 8.6 (*)    GFR calc non Af Amer 49 (*)    GFR calc Af Amer 57 (*)    All other components within normal limits    EKG None  Radiology US Venous Img Lower Unilateral Left  Result Date: 06/20/2019 CLINICAL DATA:  Lower extremity pain and edema EXAM: LEFT LOWER EXTREMITY VENOUS DUPLEX ULTRASOUND TECHNIQUE: Gray-scale sonography with graded compression, as well as color Doppler and duplex ultrasound were performed to evaluate the left lower extremity deep venous system from the level of the common femoral vein and including the common femoral, femoral, profunda femoral, popliteal and calf veins  including the posterior tibial, peroneal and gastrocnemius veins when visible. The superficial great saphenous vein was also interrogated. Spectral Doppler was utilized to evaluate flow at rest and with distal augmentation maneuvers in the common femoral, femoral and popliteal veins. COMPARISON:  None. FINDINGS: Contralateral Common Femoral Vein: Respiratory phasicity is normal and symmetric with the symptomatic side. No evidence of thrombus. Normal compressibility. Common Femoral Vein: No evidence of thrombus. Normal compressibility, respiratory phasicity and response to augmentation. Saphenofemoral Junction: No evidence of thrombus. Normal compressibility and flow on color Doppler imaging. Profunda Femoral Vein: No evidence of thrombus. Normal compressibility and flow on color Doppler imaging. Femoral Vein: No evidence of thrombus. Normal compressibility, respiratory phasicity and response to augmentation. Popliteal Vein: No evidence of thrombus. Normal compressibility, respiratory phasicity and response to augmentation. Calf Veins: No evidence of thrombus. Normal compressibility and flow on color Doppler imaging. Superficial Great Saphenous Vein: No evidence of thrombus. Normal compressibility. Venous Reflux:  None. There is a mildly complex predominantly cystic structure in the left popliteal fossa measuring 4.3 x 1.2 x 2.0 cm. IMPRESSION: No evidence of deep venous thrombosis in the left lower extremity. Right common femoral vein also patent. Apparent Baker cyst within the left popliteal fossa region measuring 4.3 x 1.2 x 2.0 cm. Echogenic foci in this structure most likely represent either residua of hemorrhage or infection. Electronically Signed   By: Lowella Grip III M.D.   On: 06/20/2019 12:22    Procedures Procedures (including critical care time)  Medications Ordered in ED Medications - No data to display  ED Course  I have reviewed the triage vital signs and the nursing notes.  Pertinent  labs & imaging results that were available during my care of the patient were reviewed by me and considered in my medical decision making (see chart for details).    MDM Rules/Calculators/A&P                      Pt  here for evaluation of a possible DVT of the left lower leg.  He also endorses a twisting injury of his leg yesterday.  NV intact. Mild edema noted w/o erythema or excessive warmth.  He is anticoagulated with Xarelto.  Ambulatory with a cane at baseline.  Will obtain venous US to eval for DVT.    US of the extremity is negative for DVT.  Baker cyst noted.   Discussed findings with pt, he agrees to symptomatic tx and out pt f/u.      Final Clinical Impression(s) / ED Diagnoses Final diagnoses:  Pain of left lower leg  Baker cyst, left    Rx / DC Orders ED Discharge Orders    None       Kem Parkinson, PA-C 06/20/19 1319    Lajean Saver, MD 06/21/19 1436

## 2019-06-20 NOTE — Discharge Instructions (Addendum)
Your ultrasound today did not show evidence for a blood clot in your leg.  You do have a Baker's cyst behind your knee.  This may cause some discomfort and occasional swelling.  Try to elevate your leg when possible.  Tylenol every 4 hrs if needed for pain.  Follow-up with your doctor for recheck if needed.

## 2019-06-20 NOTE — ED Triage Notes (Signed)
Pt c/o left lower leg pain and swelling that started yesterday. Pt also reports tightness behind his left knee and calf. Pt is on a blood thinner, unknown which one. Denies hx of DVT.

## 2019-08-02 ENCOUNTER — Other Ambulatory Visit: Payer: Self-pay

## 2019-08-02 ENCOUNTER — Emergency Department (HOSPITAL_COMMUNITY): Payer: No Typology Code available for payment source

## 2019-08-02 ENCOUNTER — Emergency Department (HOSPITAL_COMMUNITY)
Admission: EM | Admit: 2019-08-02 | Discharge: 2019-08-02 | Disposition: A | Discharge status: Home / Self Care | Payer: No Typology Code available for payment source | Attending: Emergency Medicine | Admitting: Emergency Medicine

## 2019-08-02 ENCOUNTER — Encounter (HOSPITAL_COMMUNITY): Payer: Self-pay | Admitting: Emergency Medicine

## 2019-08-02 DIAGNOSIS — Z87891 Personal history of nicotine dependence: Secondary | ICD-10-CM | POA: Diagnosis not present

## 2019-08-02 DIAGNOSIS — I10 Essential (primary) hypertension: Secondary | ICD-10-CM | POA: Diagnosis not present

## 2019-08-02 DIAGNOSIS — I480 Paroxysmal atrial fibrillation: Secondary | ICD-10-CM | POA: Insufficient documentation

## 2019-08-02 DIAGNOSIS — M1712 Unilateral primary osteoarthritis, left knee: Secondary | ICD-10-CM | POA: Diagnosis not present

## 2019-08-02 DIAGNOSIS — M79605 Pain in left leg: Secondary | ICD-10-CM | POA: Diagnosis present

## 2019-08-02 DIAGNOSIS — M7122 Synovial cyst of popliteal space [Baker], left knee: Secondary | ICD-10-CM | POA: Diagnosis not present

## 2019-08-02 DIAGNOSIS — Z79899 Other long term (current) drug therapy: Secondary | ICD-10-CM | POA: Diagnosis not present

## 2019-08-02 DIAGNOSIS — Z8546 Personal history of malignant neoplasm of prostate: Secondary | ICD-10-CM | POA: Insufficient documentation

## 2019-08-02 DIAGNOSIS — Z85038 Personal history of other malignant neoplasm of large intestine: Secondary | ICD-10-CM | POA: Insufficient documentation

## 2019-08-02 DIAGNOSIS — Z950 Presence of cardiac pacemaker: Secondary | ICD-10-CM | POA: Diagnosis not present

## 2019-08-02 MED ORDER — MELOXICAM 15 MG PO TABS
15.0000 mg | ORAL_TABLET | Freq: Every day | ORAL | 0 refills | Status: DC | PRN
Start: 2019-08-02 — End: 2020-06-24

## 2019-08-02 MED ORDER — MELOXICAM 15 MG PO TABS
15.0000 mg | ORAL_TABLET | Freq: Every day | ORAL | 0 refills | Status: DC
Start: 2019-08-02 — End: 2019-08-02

## 2019-08-02 NOTE — Discharge Instructions (Signed)
Take the Mobic daily as needed for pain.  Continue using the skin cream and tylenol.

## 2019-08-02 NOTE — ED Provider Notes (Signed)
[facility] EMERGENCY DEPARTMENT Provider Note   CSN: [id number] Arrival date & time: 08/02/19  2010     History Chief Complaint  Patient presents with  . Leg Pain    Derrick Long is a 86 y.o. male.  Pt presents to the ED with left leg pain.  The pt has had left leg pain for a few months.  He was seen in the ED on 5/4 and had an [place] to r/o DVT.  US showed a baker's cyst.  [facility].  Pt said it is still hurting, but not right now.  No sob or CP.  Pt is on Xarelto due to a hx of afib.  He's been compliant with meds.        Past Medical History:  Diagnosis Date  . Acid reflux   . Anemia   . Ex-cigarette smoker   . Gout   . Hyperlipemia   . Hypertension   . Prostate cancer ([place]) 6.23.2015   SEEDS 20 YEARS AGO  . Prostate disease     Patient Active Problem List   Diagnosis Date Noted  . Paroxysmal atrial fibrillation ([place]) 09/02/2012  . HTN (hypertension) 09/02/2012  . Pacemaker 08/03/2012  . Chest pain 07/31/2012  . Leukocytosis, unspecified 07/31/2012  . ARF (acute renal failure) ([place]) 07/31/2012  . Gout 07/31/2012  . First degree AV block, PR interval 370 ms 07/19/2012  . Chest pain, atypical 07/19/2012  . Acute renal insufficiency 07/19/2012  . Hypokalemia 07/19/2012  . Syncope 07/19/2012  . Bradycardia 07/19/2012  . LUMBAR STRAIN 09/10/2008  . ANEMIA, IRON DEFICIENCY NOS 08/16/2008  . OSTEOARTHRITIS, MODERATE 09/21/2007  . INSOMNIA 06/24/2007  . HYPERLIPIDEMIA 04/05/2007  . DENTAL CARIES 10/08/2006  . LATERAL MENISCUS TEAR, RIGHT 04/19/2006  . MIGRAINE HEADACHE 12/18/2005  . HYPERTENSION 12/18/2005  . GERD 12/18/2005  . RENAL CALCULUS 12/18/2005  . BENIGN PROSTATIC HYPERTROPHY, WITH OBSTRUCTION 12/18/2005  . ARTHRITIS 12/18/2005  . LOW BACK PAIN 12/18/2005  . OSTEOPENIA 12/18/2005  . COLON CANCER, HX OF 12/18/2005  . PROSTATE CANCER, HX OF 12/18/2005    Past Surgical History:  Procedure Laterality Date   . LEFT HEART CATHETERIZATION WITH CORONARY ANGIOGRAM N/A 07/21/2012   Procedure: LEFT HEART CATHETERIZATION WITH CORONARY ANGIOGRAM;  Surgeon: Troy Sine, MD;  Location: [facility] CATH LAB;  Service: Cardiovascular;  Laterality: N/A;  . PACEMAKER INSERTION    . PERMANENT PACEMAKER INSERTION N/A 07/22/2012   Procedure: PERMANENT PACEMAKER INSERTION;  Surgeon: Sanda Klein, MD;  Location: [facility] CATH LAB;  Service: Cardiovascular;  Laterality: N/A;  . PROSTATE SURGERY         History reviewed. No pertinent family history.  Social History   Tobacco Use  . Smoking status: Former Smoker    Quit date: 07/20/2002    Years since quitting: 17.0  . Smokeless tobacco: Never Used  Vaping Use  . Vaping Use: Never used  Substance Use Topics  . Alcohol use: No  . Drug use: No    Home Medications Prior to Admission medications   Medication Sig Start Date End Date Taking? Authorizing Provider  alfuzosin (UROXATRAL) 10 MG 24 hr tablet Take 10 mg by mouth at bedtime.     [provider]  ALPRAZolam Duanne Moron) 0.5 MG tablet Take 1 tablet (0.5 mg total) by mouth at bedtime as needed for sleep. 06/04/16   Carmin Muskrat, MD  amLODipine (NORVASC) 10 MG tablet Take 10 mg by mouth every morning.  [provider]  atorvastatin (LIPITOR) 20 MG tablet Take 1 tablet (20 mg total) by mouth daily at 6 PM. 07/23/12   Consuelo Pandy, PA-C  carvedilol (COREG) 25 MG tablet Take 25 mg by mouth 2 (two) times daily with a meal.    [provider]  colchicine 0.6 MG tablet Take 1 tablet (0.6 mg total) by mouth 2 (two) times daily as needed (gout). 02/18/17   Harris, Abigail, PA-C  Cyanocobalamin (B-12) 1000 MCG CAPS Take 1 tablet by mouth every morning.     [provider]  diclofenac sodium (VOLTAREN) 1 % GEL Apply 2 g topically 4 (four) times daily. 06/19/17   Rancour, Annie Main, MD  ferrous sulfate 325 (65 FE) MG tablet Take 325 mg by mouth 2 (two) times daily with a meal.    [provider]  fluticasone (FLONASE) 50 MCG/ACT nasal spray Place 2 sprays into both nostrils daily. Patient not taking: Reported on 06/20/2019 02/15/16   Ward, Delice Bison, DO  furosemide (LASIX) 40 MG tablet Take 40 mg by mouth every morning.     [provider]  lisinopril (PRINIVIL,ZESTRIL) 40 MG tablet Take 40 mg by mouth daily.    [provider]  meloxicam (MOBIC) 15 MG tablet Take 1 tablet (15 mg total) by mouth daily as needed for pain. 08/02/19   Isla Pence, MD  methocarbamol (ROBAXIN) 500 MG tablet Take 1 tablet (500 mg total) by mouth every 8 (eight) hours as needed for muscle spasms. Patient not taking: Reported on 06/20/2019 06/19/17   Ezequiel Essex, MD  omeprazole (PRILOSEC) 20 MG capsule Take 20 mg by mouth every morning.     [provider]  rivaroxaban (XARELTO) 20 MG TABS tablet Take 1 tablet (20 mg total) by mouth daily with supper. 10/30/13   Croitoru, Mihai, MD  tamsulosin (FLOMAX) 0.4 MG CAPS capsule Take 0.8 mg by mouth at bedtime.    [provider]    Allergies    Patient has no known allergies.  Review of Systems   Review of Systems  Musculoskeletal:       Left leg pain  All other systems reviewed and are negative.   Physical Exam Updated Vital Signs BP (!) 151/61 (BP Location: Right Arm)   Pulse 65   Temp 97.9 F (36.6 C) (Oral)   Resp 20   Ht 6' (1.829 m)   Wt 92.5 kg   SpO2 99%   BMI 27.67 kg/m   Physical Exam Vitals and nursing note reviewed.  Constitutional:      Appearance: Normal appearance.  HENT:     Head: Normocephalic and atraumatic.     Right Ear: External ear normal.     Left Ear: External ear normal.     Nose: Nose normal.     Mouth/Throat:     Mouth: Mucous membranes are moist.     Pharynx: Oropharynx is clear.  Eyes:     Extraocular Movements: Extraocular movements intact.     Conjunctiva/sclera: Conjunctivae normal.     Pupils: Pupils are equal, round, and reactive to light.   Cardiovascular:     Rate and Rhythm: Normal rate and regular rhythm.     Pulses: Normal pulses.     Heart sounds: Normal heart sounds.  Pulmonary:     Effort: Pulmonary effort is normal.     Breath sounds: Normal breath sounds.  Abdominal:     General: Abdomen is flat. Bowel sounds are normal.  Palpations: Abdomen is soft.  Musculoskeletal:     Cervical back: Normal range of motion and neck supple.     Left knee: Swelling present.       Legs:  Skin:    General: Skin is warm.     Capillary Refill: Capillary refill takes less than 2 seconds.  Neurological:     General: No focal deficit present.     Mental Status: He is alert and oriented to person, place, and time.  Psychiatric:        Mood and Affect: Mood normal.        Behavior: Behavior normal.     ED Results / Procedures / Treatments   Labs (all labs ordered are listed, but only abnormal results are displayed) Labs Reviewed - No data to display  EKG None  Radiology CT Knee Left Wo Contrast  Result Date: 08/02/2019 CLINICAL DATA:  Limping left knee pain EXAM: CT OF THE left KNEE WITHOUT CONTRAST TECHNIQUE: Multidetector CT imaging of the left knee was performed according to the standard protocol. Multiplanar CT image reconstructions were also generated. COMPARISON:  Ultrasound 06/20/2019 FINDINGS: Bones/Joint/Cartilage No fracture or malalignment. Moderate medial joint space narrowing with spurring. Mild lateral joint space degenerative changes. Mild patellofemoral degenerative changes. Shallow trochlea with minimal lateral deviation of patella. Small knee effusion. Calcifications within the medial and lateral joint space. Ligaments Suboptimally assessed by CT. Muscles and Tendons Quadriceps tendon and patellar tendons appear intact. No intramuscular fluid collections. Soft tissues Vascular calcifications. Slightly thick walled popliteal fossa cyst. This measures 2.7 by 1.9 by 6.5 cm. Slight increased internal density  consistent with complex cyst. Generalized edema within the subcutaneous soft tissues. IMPRESSION: 1. No acute osseous abnormality. Shallow trochlea with mild lateral deviation of the patella. 2. Tricompartment arthritis with small suprapatellar knee effusion. Chondrocalcinosis. 3. 2.7 x 1.9 x 6.5 cm Baker's cyst containing slight increased internal density consistent with complex cyst, either containing hemorrhagic or proteinaceous debris. Electronically Signed   By: Donavan Foil M.D.   On: 08/02/2019 22:18    Procedures Procedures (including critical care time)  Medications Ordered in ED Medications - No data to display  ED Course  I have reviewed the triage vital signs and the nursing notes.  Pertinent labs & imaging results that were available during my care of the patient were reviewed by me and considered in my medical decision making (see chart for details).    MDM Rules/Calculators/A&P                          Pt does have OA and a baker's cyst.  I will try pt on mobic.  Pt is instructed to return if worse.  F/u with pcp.  Final Clinical Impression(s) / ED Diagnoses Final diagnoses:  Baker's cyst of knee, left  Osteoarthritis of left knee, unspecified osteoarthritis type    Rx / DC Orders ED Discharge Orders         Ordered    meloxicam (MOBIC) 15 MG tablet  Daily,   Status:  Discontinued     Reprint     08/02/19 2231    meloxicam (MOBIC) 15 MG tablet  Daily PRN     Discontinue  Reprint     08/02/19 2231           Isla Pence, MD 08/02/19 2238

## 2019-08-02 NOTE — ED Triage Notes (Signed)
Pt c/o left leg pain x one month.

## 2019-09-15 ENCOUNTER — Emergency Department (HOSPITAL_COMMUNITY)
Admission: EM | Admit: 2019-09-15 | Discharge: 2019-09-15 | Disposition: A | Discharge status: Home / Self Care | Payer: No Typology Code available for payment source | Source: Home / Self Care | Attending: Emergency Medicine | Admitting: Emergency Medicine

## 2019-09-15 ENCOUNTER — Encounter (HOSPITAL_COMMUNITY): Payer: Self-pay

## 2019-09-15 ENCOUNTER — Emergency Department (HOSPITAL_COMMUNITY): Payer: No Typology Code available for payment source

## 2019-09-15 ENCOUNTER — Other Ambulatory Visit: Payer: Self-pay

## 2019-09-15 ENCOUNTER — Inpatient Hospital Stay (HOSPITAL_COMMUNITY)
Admission: EM | Admit: 2019-09-15 | Discharge: 2019-09-18 | DRG: 372 | Disposition: A | Discharge status: Home / Self Care | Payer: No Typology Code available for payment source | Attending: Internal Medicine | Admitting: Internal Medicine

## 2019-09-15 DIAGNOSIS — K529 Noninfective gastroenteritis and colitis, unspecified: Secondary | ICD-10-CM | POA: Diagnosis present

## 2019-09-15 DIAGNOSIS — R110 Nausea: Secondary | ICD-10-CM

## 2019-09-15 DIAGNOSIS — N2889 Other specified disorders of kidney and ureter: Secondary | ICD-10-CM | POA: Diagnosis present

## 2019-09-15 DIAGNOSIS — Z9049 Acquired absence of other specified parts of digestive tract: Secondary | ICD-10-CM

## 2019-09-15 DIAGNOSIS — I10 Essential (primary) hypertension: Secondary | ICD-10-CM | POA: Insufficient documentation

## 2019-09-15 DIAGNOSIS — R001 Bradycardia, unspecified: Secondary | ICD-10-CM | POA: Diagnosis present

## 2019-09-15 DIAGNOSIS — R590 Localized enlarged lymph nodes: Secondary | ICD-10-CM

## 2019-09-15 DIAGNOSIS — E785 Hyperlipidemia, unspecified: Secondary | ICD-10-CM | POA: Diagnosis present

## 2019-09-15 DIAGNOSIS — M545 Low back pain, unspecified: Secondary | ICD-10-CM | POA: Diagnosis present

## 2019-09-15 DIAGNOSIS — R197 Diarrhea, unspecified: Secondary | ICD-10-CM | POA: Insufficient documentation

## 2019-09-15 DIAGNOSIS — E8809 Other disorders of plasma-protein metabolism, not elsewhere classified: Secondary | ICD-10-CM

## 2019-09-15 DIAGNOSIS — Z20822 Contact with and (suspected) exposure to covid-19: Secondary | ICD-10-CM | POA: Diagnosis present

## 2019-09-15 DIAGNOSIS — R0902 Hypoxemia: Secondary | ICD-10-CM

## 2019-09-15 DIAGNOSIS — A045 Campylobacter enteritis: Principal | ICD-10-CM | POA: Diagnosis present

## 2019-09-15 DIAGNOSIS — Z79899 Other long term (current) drug therapy: Secondary | ICD-10-CM

## 2019-09-15 DIAGNOSIS — Z87891 Personal history of nicotine dependence: Secondary | ICD-10-CM | POA: Insufficient documentation

## 2019-09-15 DIAGNOSIS — Z791 Long term (current) use of non-steroidal anti-inflammatories (NSAID): Secondary | ICD-10-CM

## 2019-09-15 DIAGNOSIS — M109 Gout, unspecified: Secondary | ICD-10-CM | POA: Diagnosis present

## 2019-09-15 DIAGNOSIS — R109 Unspecified abdominal pain: Secondary | ICD-10-CM

## 2019-09-15 DIAGNOSIS — Z950 Presence of cardiac pacemaker: Secondary | ICD-10-CM | POA: Insufficient documentation

## 2019-09-15 DIAGNOSIS — Z7901 Long term (current) use of anticoagulants: Secondary | ICD-10-CM

## 2019-09-15 DIAGNOSIS — M899 Disorder of bone, unspecified: Secondary | ICD-10-CM | POA: Insufficient documentation

## 2019-09-15 DIAGNOSIS — E876 Hypokalemia: Secondary | ICD-10-CM | POA: Diagnosis present

## 2019-09-15 DIAGNOSIS — R1084 Generalized abdominal pain: Secondary | ICD-10-CM

## 2019-09-15 DIAGNOSIS — N400 Enlarged prostate without lower urinary tract symptoms: Secondary | ICD-10-CM | POA: Diagnosis present

## 2019-09-15 DIAGNOSIS — N179 Acute kidney failure, unspecified: Secondary | ICD-10-CM

## 2019-09-15 DIAGNOSIS — K219 Gastro-esophageal reflux disease without esophagitis: Secondary | ICD-10-CM | POA: Diagnosis present

## 2019-09-15 DIAGNOSIS — Z8546 Personal history of malignant neoplasm of prostate: Secondary | ICD-10-CM

## 2019-09-15 DIAGNOSIS — I440 Atrioventricular block, first degree: Secondary | ICD-10-CM | POA: Diagnosis present

## 2019-09-15 DIAGNOSIS — I480 Paroxysmal atrial fibrillation: Secondary | ICD-10-CM | POA: Diagnosis present

## 2019-09-15 DIAGNOSIS — Z8679 Personal history of other diseases of the circulatory system: Secondary | ICD-10-CM

## 2019-09-15 DIAGNOSIS — N289 Disorder of kidney and ureter, unspecified: Secondary | ICD-10-CM

## 2019-09-15 DIAGNOSIS — D509 Iron deficiency anemia, unspecified: Secondary | ICD-10-CM | POA: Diagnosis present

## 2019-09-15 LAB — COMPREHENSIVE METABOLIC PANEL
ALT: 13 U/L (ref 0–44)
ALT: 13 U/L (ref 0–44)
AST: 18 U/L (ref 15–41)
AST: 18 U/L (ref 15–41)
Albumin: 3.6 g/dL (ref 3.5–5.0)
Albumin: 3.2 g/dL — ABNORMAL LOW (ref 3.5–5.0)
Alkaline Phosphatase: 58 U/L (ref 38–126)
Alkaline Phosphatase: 52 U/L (ref 38–126)
Anion gap: 8 (ref 5–15)
Anion gap: 11 (ref 5–15)
BUN: 19 mg/dL (ref 8–23)
BUN: 18 mg/dL (ref 8–23)
CO2: 24 mmol/L (ref 22–32)
CO2: 20 mmol/L — ABNORMAL LOW (ref 22–32)
Calcium: 8.9 mg/dL (ref 8.9–10.3)
Calcium: 8.5 mg/dL — ABNORMAL LOW (ref 8.9–10.3)
Chloride: 104 mmol/L (ref 98–111)
Chloride: 103 mmol/L (ref 98–111)
Creatinine, Ser: 1.43 mg/dL — ABNORMAL HIGH (ref 0.61–1.24)
Creatinine, Ser: 1.5 mg/dL — ABNORMAL HIGH (ref 0.61–1.24)
GFR calc Af Amer: 49 mL/min — ABNORMAL LOW (ref >60)
GFR calc Af Amer: 47 mL/min — ABNORMAL LOW (ref >60)
GFR calc non Af Amer: 43 mL/min — ABNORMAL LOW (ref >60)
GFR calc non Af Amer: 40 mL/min — ABNORMAL LOW (ref >60)
Glucose, Bld: 116 mg/dL — ABNORMAL HIGH (ref 70–99)
Glucose, Bld: 119 mg/dL — ABNORMAL HIGH (ref 70–99)
Potassium: 3.4 mmol/L — ABNORMAL LOW (ref 3.5–5.1)
Potassium: 3.2 mmol/L — ABNORMAL LOW (ref 3.5–5.1)
Sodium: 136 mmol/L (ref 135–145)
Sodium: 134 mmol/L — ABNORMAL LOW (ref 135–145)
Total Bilirubin: 1.1 mg/dL (ref 0.3–1.2)
Total Bilirubin: 0.9 mg/dL (ref 0.3–1.2)
Total Protein: 7.2 g/dL (ref 6.5–8.1)
Total Protein: 6.7 g/dL (ref 6.5–8.1)

## 2019-09-15 LAB — CBC WITH DIFFERENTIAL/PLATELET
Abs Immature Granulocytes: 0.01 10*3/uL (ref 0.00–0.07)
Abs Immature Granulocytes: 0.03 10*3/uL (ref 0.00–0.07)
Basophils Absolute: 0 10*3/uL (ref 0.0–0.1)
Basophils Absolute: 0 10*3/uL (ref 0.0–0.1)
Basophils Relative: 0 %
Basophils Relative: 0 %
Eosinophils Absolute: 0 10*3/uL (ref 0.0–0.5)
Eosinophils Absolute: 0 10*3/uL (ref 0.0–0.5)
Eosinophils Relative: 0 %
Eosinophils Relative: 0 %
HCT: 37.9 % — ABNORMAL LOW (ref 39.0–52.0)
HCT: 33.7 % — ABNORMAL LOW (ref 39.0–52.0)
Hemoglobin: 12.4 g/dL — ABNORMAL LOW (ref 13.0–17.0)
Hemoglobin: 11.3 g/dL — ABNORMAL LOW (ref 13.0–17.0)
Immature Granulocytes: 0 %
Immature Granulocytes: 0 %
Lymphocytes Relative: 13 %
Lymphocytes Relative: 10 %
Lymphs Abs: 0.8 10*3/uL (ref 0.7–4.0)
Lymphs Abs: 0.7 10*3/uL (ref 0.7–4.0)
MCH: 33.4 pg (ref 26.0–34.0)
MCH: 33 pg (ref 26.0–34.0)
MCHC: 32.7 g/dL (ref 30.0–36.0)
MCHC: 33.5 g/dL (ref 30.0–36.0)
MCV: 102.2 fL — ABNORMAL HIGH (ref 80.0–100.0)
MCV: 98.5 fL (ref 80.0–100.0)
Monocytes Absolute: 0.6 10*3/uL (ref 0.1–1.0)
Monocytes Absolute: 0.7 10*3/uL (ref 0.1–1.0)
Monocytes Relative: 10 %
Monocytes Relative: 10 %
Neutro Abs: 4.5 10*3/uL (ref 1.7–7.7)
Neutro Abs: 5.6 10*3/uL (ref 1.7–7.7)
Neutrophils Relative %: 77 %
Neutrophils Relative %: 80 %
Platelets: 159 10*3/uL (ref 150–400)
Platelets: 154 10*3/uL (ref 150–400)
RBC: 3.71 MIL/uL — ABNORMAL LOW (ref 4.22–5.81)
RBC: 3.42 MIL/uL — ABNORMAL LOW (ref 4.22–5.81)
RDW: 13.8 % (ref 11.5–15.5)
RDW: 13.7 % (ref 11.5–15.5)
WBC: 5.8 10*3/uL (ref 4.0–10.5)
WBC: 7.1 10*3/uL (ref 4.0–10.5)
nRBC: 0 % (ref 0.0–0.2)
nRBC: 0 % (ref 0.0–0.2)

## 2019-09-15 LAB — URINALYSIS, ROUTINE W REFLEX MICROSCOPIC
Bacteria, UA: NONE SEEN
Bilirubin Urine: NEGATIVE
Glucose, UA: NEGATIVE mg/dL
Ketones, ur: NEGATIVE mg/dL
Leukocytes,Ua: NEGATIVE
Nitrite: NEGATIVE
Protein, ur: NEGATIVE mg/dL
Specific Gravity, Urine: 1.017 (ref 1.005–1.030)
pH: 5 (ref 5.0–8.0)

## 2019-09-15 LAB — LIPASE, BLOOD
Lipase: 24 U/L (ref 11–51)
Lipase: 20 U/L (ref 11–51)

## 2019-09-15 LAB — SARS CORONAVIRUS 2 BY RT PCR (HOSPITAL ORDER, PERFORMED IN ~~LOC~~ HOSPITAL LAB): SARS Coronavirus 2: NEGATIVE

## 2019-09-15 MED ORDER — IOHEXOL 300 MG/ML  SOLN
100.0000 mL | Freq: Once | INTRAMUSCULAR | Status: AC | PRN
  Administered 2019-09-15: 80 mL via INTRAVENOUS

## 2019-09-15 MED ORDER — SODIUM CHLORIDE 0.9 % IV BOLUS (SEPSIS)
500.0000 mL | Freq: Once | INTRAVENOUS | Status: AC
  Administered 2019-09-15: 500 mL via INTRAVENOUS

## 2019-09-15 MED ORDER — METRONIDAZOLE 500 MG PO TABS
500.0000 mg | ORAL_TABLET | Freq: Once | ORAL | Status: AC
  Administered 2019-09-16: 500 mg via ORAL
  Filled 2019-09-15: qty 1

## 2019-09-15 MED ORDER — SODIUM CHLORIDE 0.9 % IV SOLN
1000.0000 mL | INTRAVENOUS | Status: DC
  Administered 2019-09-16: 1000 mL via INTRAVENOUS

## 2019-09-15 MED ORDER — ONDANSETRON HCL 4 MG/2ML IJ SOLN
4.0000 mg | Freq: Once | INTRAMUSCULAR | Status: AC
  Administered 2019-09-15: 4 mg via INTRAVENOUS
  Filled 2019-09-15: qty 2

## 2019-09-15 MED ORDER — CIPROFLOXACIN HCL 250 MG PO TABS
250.0000 mg | ORAL_TABLET | Freq: Two times a day (BID) | ORAL | 0 refills | Status: DC
Start: 2019-09-15 — End: 2019-09-18

## 2019-09-15 MED ORDER — CIPROFLOXACIN IN D5W 400 MG/200ML IV SOLN
400.0000 mg | Freq: Two times a day (BID) | INTRAVENOUS | Status: DC
  Administered 2019-09-16: 400 mg via INTRAVENOUS
  Filled 2019-09-15: qty 200

## 2019-09-15 MED ORDER — LOPERAMIDE HCL 2 MG PO CAPS
2.0000 mg | ORAL_CAPSULE | Freq: Four times a day (QID) | ORAL | 0 refills | Status: DC | PRN
Start: 2019-09-15 — End: 2020-06-24

## 2019-09-15 MED ORDER — METRONIDAZOLE 500 MG PO TABS
500.0000 mg | ORAL_TABLET | Freq: Two times a day (BID) | ORAL | 0 refills | Status: DC
Start: 2019-09-15 — End: 2019-09-18

## 2019-09-15 MED ORDER — MORPHINE SULFATE (PF) 4 MG/ML IV SOLN
4.0000 mg | Freq: Once | INTRAVENOUS | Status: AC
  Administered 2019-09-15: 4 mg via INTRAVENOUS
  Filled 2019-09-15: qty 1

## 2019-09-15 NOTE — ED Notes (Signed)
[facility] called with New Pt Appt. For Wed. Aug. 9th at [place] informed.

## 2019-09-15 NOTE — ED Triage Notes (Signed)
Pt seen here earlier today for same complaints, abd pain, nausea, diarrhea.  Pt states he took 2 doses of immodium without improvement.

## 2019-09-15 NOTE — Discharge Instructions (Signed)
You were seen in the emergency room today with right side and lower back pain along with diarrhea.  Your diarrhea should resolve with Imodium which I have called into your pharmacy.  Return immediately if you develop blood in the diarrhea or fever.  Your CT scan showed the lesions or masses on your kidney are slightly larger.  You also have some swollen lymph nodes in your pelvis and a bony lesion in your spine.  This could represent return of cancer but more testing is needed.  Please contact the [facility] immediately for the next available appointment.  To assist with rapid evaluation I have also placed referrals to both a urology team and oncology team here locally.  Please call the numbers provided and tell them a referral was placed from the emergency department.  They should have this referral and can schedule the next available appointment.  Return to the emergency department any new or suddenly worsening symptoms.

## 2019-09-15 NOTE — ED Provider Notes (Signed)
Emergency Department Provider Note   I have reviewed the triage vital signs and the nursing notes.   HISTORY  Chief Complaint Back Pain   HPI Derrick Long is a 86 y.o. male with past history reviewed below presents to the emergency department with right flank pain and new onset diarrhea.  Patient tells me that his right flank pain has been intermittent for over 1 year.  He follows primarily at the [place]. He has had intermittent pain per his usual but in the last 1 to 2 days is developed nonbloody diarrhea.  He denies any respiratory symptoms.  No recent antibiotics.  Denies fevers or chills.  No anterior abdominal discomfort. No radiation of symptoms or modifying factors.   Past Medical History:  Diagnosis Date  . Acid reflux   . Anemia   . Ex-cigarette smoker   . Gout   . Hyperlipemia   . Hypertension   . Prostate cancer ([place]) 6.23.2015   SEEDS 20 YEARS AGO  . Prostate disease     Patient Active Problem List   Diagnosis Date Noted  . Paroxysmal atrial fibrillation ([place]) 09/02/2012  . HTN (hypertension) 09/02/2012  . Pacemaker 08/03/2012  . Chest pain 07/31/2012  . Leukocytosis, unspecified 07/31/2012  . ARF (acute renal failure) ([place]) 07/31/2012  . Gout 07/31/2012  . First degree AV block, PR interval 370 ms 07/19/2012  . Chest pain, atypical 07/19/2012  . Acute renal insufficiency 07/19/2012  . Hypokalemia 07/19/2012  . Syncope 07/19/2012  . Bradycardia 07/19/2012  . LUMBAR STRAIN 09/10/2008  . ANEMIA, IRON DEFICIENCY NOS 08/16/2008  . OSTEOARTHRITIS, MODERATE 09/21/2007  . INSOMNIA 06/24/2007  . HYPERLIPIDEMIA 04/05/2007  . DENTAL CARIES 10/08/2006  . LATERAL MENISCUS TEAR, RIGHT 04/19/2006  . MIGRAINE HEADACHE 12/18/2005  . HYPERTENSION 12/18/2005  . GERD 12/18/2005  . RENAL CALCULUS 12/18/2005  . BENIGN PROSTATIC HYPERTROPHY, WITH OBSTRUCTION 12/18/2005  . ARTHRITIS 12/18/2005  . LOW BACK PAIN 12/18/2005  . OSTEOPENIA 12/18/2005  . COLON CANCER, HX  OF 12/18/2005  . PROSTATE CANCER, HX OF 12/18/2005    Past Surgical History:  Procedure Laterality Date  . LEFT HEART CATHETERIZATION WITH CORONARY ANGIOGRAM N/A 07/21/2012   Procedure: LEFT HEART CATHETERIZATION WITH CORONARY ANGIOGRAM;  Surgeon: Troy Sine, MD;  Location[facility] C[facility] LAB;  Service: Cardiovascular;  Laterality: N/A;  . PACEMAKER INSERTION    . PERMANENT PACEMAKER INSERTION N/A 07/22/2012   Procedure: PERMANENT PACEMAKER INSERTION;  Surgeon: Sanda Klein, MD;  Location: [place] CATH LAB;  Service: Cardiovascular;  Laterality: N/A;  . PROSTATE SURGERY      Allergies Patient has no known allergies.  No family history on file.  Social History Social History   Tobacco Use  . Smoking status: Former Smoker    Quit date: 07/20/2002    Years since quitting: 17.1  . Smokeless tobacco: Never Used  Vaping Use  . Vaping Use: Never used  Substance Use Topics  . Alcohol use: No  . Drug use: No    Review of Systems  Constitutional: No fever/chills Eyes: No visual changes. ENT: No sore throat. Cardiovascular: Denies chest pain. Respiratory: Denies shortness of breath. Gastrointestinal: No abdominal pain.  Positive right flank pain (intermittent and chronic). No nausea, no vomiting. Positive diarrhea.  No constipation. Genitourinary: Negative for dysuria. Musculoskeletal: Negative for back pain. Skin: Negative for rash. Neurological: Negative for headaches, focal weakness or numbness. 10-point ROS otherwise negative.  ____________________________________________   PHYSICAL EXAM:  VITAL SIGNS: ED Triage Vitals  Enc Vitals  Group     BP 09/15/19 0832 (!) 137/60     Pulse Rate 09/15/19 0832 70     Resp 09/15/19 0832 18     Temp 09/15/19 0832 98 F (36.7 C)     Temp Source 09/15/19 0832 Oral     SpO2 09/15/19 0832 98 %     Weight 09/15/19 0830 198 lb (89.8 kg)     Height 09/15/19 0830 6\' 1"  (1.854 m)   Constitutional: Alert and oriented. Well appearing and in no  acute distress. Eyes: Conjunctivae are normal.  Head: Atraumatic. Nose: No congestion/rhinnorhea. Mouth/Throat: Mucous membranes are moist.   Neck: No stridor.  Cardiovascular: Normal rate, regular rhythm. Good peripheral circulation. Grossly normal heart sounds.   Respiratory: Normal respiratory effort.  No retractions. Lungs CTAB. Gastrointestinal: Soft and nontender. No distention. No CVA tenderness.  Musculoskeletal: No lower extremity tenderness nor edema. No gross deformities of extremities. No midline thoracic or lumbar spine tenderness.  Neurologic:  Normal speech and language. No gross focal neurologic deficits are appreciated.  Skin:  Skin is warm, dry and intact. No rash noted.   ____________________________________________   LABS (all labs ordered are listed, but only abnormal results are displayed)  Labs Reviewed  CBC WITH DIFFERENTIAL/PLATELET - Abnormal; Notable for the following components:      Result Value   RBC 3.71 (*)    Hemoglobin 12.4 (*)    HCT 37.9 (*)    MCV 102.2 (*)    All other components within normal limits  COMPREHENSIVE METABOLIC PANEL - Abnormal; Notable for the following components:   Potassium 3.4 (*)    Glucose, Bld 116 (*)    Creatinine, Ser 1.43 (*)    GFR calc non Af Amer 43 (*)    GFR calc Af Amer 49 (*)    All other components within normal limits  URINALYSIS, ROUTINE W REFLEX MICROSCOPIC - Abnormal; Notable for the following components:   Hgb urine dipstick SMALL (*)    All other components within normal limits  URINE CULTURE  LIPASE, BLOOD   ____________________________________________  RADIOLOGY  CT ABDOMEN PELVIS W CONTRAST  Result Date: 09/15/2019 CLINICAL DATA:  Abdominal pain and mid back pain for a day EXAM: CT ABDOMEN AND PELVIS WITH CONTRAST TECHNIQUE: Multidetector CT imaging of the abdomen and pelvis was performed using the standard protocol following bolus administration of intravenous contrast. CONTRAST:  25mL  OMNIPAQUE IOHEXOL 300 MG/ML  SOLN COMPARISON:  August 09, 2013 FINDINGS: Lower chest: There are increased LEFT basilar dependent ground-glass opacities. Hepatobiliary: Status post cholecystectomy with unchanged central biliary ductal dilation since 2015. Common bile duct measures approximately 11 mm. Pancreas: Unremarkable. No pancreatic ductal dilatation or surrounding inflammatory changes. Spleen: Normal in size without focal abnormality. Adrenals/Urinary Tract: Adrenal glands are unremarkable. Revisualization of several indeterminate renal lesions. LEFT upper in the anterior renal lesion measures 2.2 x 2.1 by 1.6 cm, previously 1.9 x 1.6 by 1.1 cm (series 2, image 21; series 5, image 53). Indeterminate lesion of the inferior pole of the LEFT kidney measures 2.0 x 1.9 by 1.8 cm, previously 1.4 x 1.4 by 1.1 cm (series 2, image 35; series 5, image 57). Indeterminate lesion of the interpolar medial LEFT kidney measures 11 mm, previously 6 mm (series 2, image 27). Indeterminate lesion of the lateral inferior RIGHT kidney with an enhancing septum measures 2.3 cm, previously 2 cm (series 2, image 36). Additional indeterminate lesion of the inferior pole with thin enhancing septation measures 19 mm, previously 15  mm (series 2, image 41). Additional subcentimeter hypodense lesions are too small to accurately characterize. No hydronephrosis. Bladder is decompressed. Stomach/Bowel: Small hiatal hernia. Status post RIGHT colonic surgery. Colon is fluid filled with mild subjective diffuse bowel wall thickening. Scattered diverticulosis. No evidence of bowel obstruction. Vascular/Lymphatic: RIGHT external iliac lymph node measures 14 mm in the short axis, previously 3 mm (series 2, image 66). LEFT internal iliac lymph node measures 7 mm, not previously visualized (series 2, image 66). 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 This is unchanged. Reproductive: Brachytherapy seeds. Other: No free fluid. Musculoskeletal: Degenerative changes of the lumbar spine. Transitional anatomy with a LEFT greater than RIGHT-sided assimilation joint at L5-S1 and rib lets at T12. Advanced degenerative changes of the RIGHT hip. Bone island of the RIGHT pubic symphysis. New tiny focus of sclerosis in the LEFT iliac bone, nonspecific (series 2, image 55). New sclerotic lesion of the RIGHT aspect of the L1 vertebral body (series 2, image 29 IMPRESSION: 1. Colon is fluid-filled with mild subjective diffuse bowel wall thickening carotid findings could reflect a colitis in the appropriate clinical setting. 2. Revisualization of several indeterminate renal lesions, some of which have mildly increased in size compared to the prior study. Findings are concerning for renal cell carcinoma. Further evaluation with renal protocol CT or MRI is recommended. 3. New pelvic lymphadenopathy as well as new sclerotic lesions in the RIGHT aspect of the L1 vertebral body and the LEFT iliac bone. Given history of prostate cancer, findings are concerning for nodal and osseous metastatic disease. Recommend correlation with PSA. 4. Increased LEFT basilar dependent ground-glass opacities may represent atelectasis or pneumonia. Aortic Atherosclerosis (ICD10-I70.0). Electronically Signed   By: Valentino Saxon MD   On: 09/15/2019 11:26    ____________________________________________   PROCEDURES  Procedure(s) performed:   Procedures  None  ____________________________________________   INITIAL IMPRESSION / ASSESSMENT AND PLAN / ED COURSE  Pertinent labs & imaging results that were available during my care of the patient were reviewed by me and considered in my medical decision making (see chart for details).   Patient presents to the emergency department with acute on chronic right flank pain and new onset  diarrhea.  Low risk for C. difficile.  No significant tenderness in the to palpation anteriorly.  Given patient's age and flank pain I do plan for CT imaging along with lab work. Vitals are WNL. Nothing on exam or history to suspect atypical ACS.   12:00 PM  CT imaging along with lab work and UA reviewed and discussed with the patient and family at bedside.  I did place ambulatory referrals to both urology and oncology locally.  He is followed by the [facility] but reports having some trouble getting quick appointments there.  To facilitate a quick outpatient work-up I have placed the above referrals but patient will call the [place] [facility] today and see if they can see him soon.  They can see him quickly he will canceled the appointments with [place].  We were able to make an oncology appointment for Wednesday at 8 AM here at the cancer center.   Suspect atelectasis at lung bases on CT. Patient has no respiratory symptoms to suspect developing PNA.   We will give Imodium for diarrhea symptoms.  No antibiotics at this time.  Labs show no leukocytosis or acute dehydration/AKI. No abdominal  tenderness. No blood in stool. Will f/u with PCP regarding these symptoms.   Discussed ED return precautions and answered all questions.  ____________________________________________  FINAL CLINICAL IMPRESSION(S) / ED DIAGNOSES  Final diagnoses:  Right flank pain  Diarrhea, unspecified type  Renal mass, right  Lytic bone lesions on xray     MEDICATIONS GIVEN DURING THIS VISIT:  Medications  iohexol (OMNIPAQUE) 300 MG/ML solution 100 mL (80 mLs Intravenous Contrast Given 09/15/19 1033)     NEW OUTPATIENT MEDICATIONS STARTED DURING THIS VISIT:  New Prescriptions   LOPERAMIDE (IMODIUM) 2 MG CAPSULE    Take 1 capsule (2 mg total) by mouth 4 (four) times daily as needed for diarrhea or loose stools.    Note:  This document was prepared using Dragon voice recognition software and may include unintentional dictation  errors.  Nanda Quinton, MD, [facility]    Cecil Vandyke, [facility], MD 09/15/19 [phone]

## 2019-09-15 NOTE — ED Triage Notes (Signed)
Pt reports pain in middle of lower back, diarrhea, and nausea since yesterday.

## 2019-09-15 NOTE — ED Notes (Signed)
From home  [facility]  Seen earlier today for same   Has returned due to fever   Continues with D, N and abd pain

## 2019-09-15 NOTE — ED Provider Notes (Signed)
[name] EMERGENCY DEPARTMENT Provider Note   CSN: [id number] Arrival date & time: 09/15/19  2153     History Chief Complaint  Patient presents with  . Abdominal Pain    Derrick Long is a 86 y.o. male.  HPI   Patient presents to the ED for evaluation of abdominal pain.  Patient was here in the emergency room earlier today for the same reason.  Patient states since leaving the hospital he has been having persistent issues with nausea and an inability to eat.  He is also had several episodes of loose stool, at least 5 that did not get any better with the Imodium.  Patient is also having discomfort in his lower abdomen.  Earlier today in the ED he had a CT scan.  The CT scan showed findings suggestive of possible colitis.  Patient also had findings on CT scan that were concerning for the possibility of renal carcinoma.  Patient also had pelvic lymphadenopathy and sclerotic lesions in the vertebral bodies.  These findings were concerning for metastatic prostate CA.  Past Medical History:  Diagnosis Date  . Acid reflux   . Anemia   . Ex-cigarette smoker   . Gout   . Hyperlipemia   . Hypertension   . Prostate cancer ([place]) 6.23.2015   SEEDS 20 YEARS AGO  . Prostate disease     Patient Active Problem List   Diagnosis Date Noted  . Paroxysmal atrial fibrillation ([place]) 09/02/2012  . HTN (hypertension) 09/02/2012  . Pacemaker 08/03/2012  . Chest pain 07/31/2012  . Leukocytosis, unspecified 07/31/2012  . ARF (acute renal failure) ([place]) 07/31/2012  . Gout 07/31/2012  . First degree AV block, PR interval 370 ms 07/19/2012  . Chest pain, atypical 07/19/2012  . Acute renal insufficiency 07/19/2012  . Hypokalemia 07/19/2012  . Syncope 07/19/2012  . Bradycardia 07/19/2012  . LUMBAR STRAIN 09/10/2008  . ANEMIA, IRON DEFICIENCY NOS 08/16/2008  . OSTEOARTHRITIS, MODERATE 09/21/2007  . INSOMNIA 06/24/2007  . HYPERLIPIDEMIA 04/05/2007  . DENTAL CARIES 10/08/2006  . LATERAL  MENISCUS TEAR, RIGHT 04/19/2006  . MIGRAINE HEADACHE 12/18/2005  . HYPERTENSION 12/18/2005  . GERD 12/18/2005  . RENAL CALCULUS 12/18/2005  . BENIGN PROSTATIC HYPERTROPHY, WITH OBSTRUCTION 12/18/2005  . ARTHRITIS 12/18/2005  . LOW BACK PAIN 12/18/2005  . OSTEOPENIA 12/18/2005  . COLON CANCER, HX OF 12/18/2005  . PROSTATE CANCER, HX OF 12/18/2005    Past Surgical History:  Procedure Laterality Date  . LEFT HEART CATHETERIZATION WITH CORONARY ANGIOGRAM N/A 07/21/2012   Procedure: LEFT HEART CATHETERIZATION WITH CORONARY ANGIOGRAM;  Surgeon: Troy Sine, MD;  Location: [facility] CATH LAB;  Service: Cardiovascular;  Laterality: N/A;  . PACEMAKER INSERTION    . PERMANENT PACEMAKER INSERTION N/A 07/22/2012   Procedure: PERMANENT PACEMAKER INSERTION;  Surgeon: Sanda Klein, MD;  Location: [facility] CATH LAB;  Service: Cardiovascular;  Laterality: N/A;  . PROSTATE SURGERY         No family history on file.  Social History   Tobacco Use  . Smoking status: Former Smoker    Quit date: 07/20/2002    Years since quitting: 17.1  . Smokeless tobacco: Never Used  Vaping Use  . Vaping Use: Never used  Substance Use Topics  . Alcohol use: No  . Drug use: No    Home Medications Prior to Admission medications   Medication Sig Start Date End Date Taking? Authorizing Provider  alfuzosin (UROXATRAL) 10 MG 24 hr tablet Take 10 mg by mouth at bedtime.  [provider]  ALPRAZolam Duanne Moron) 0.5 MG tablet Take 1 tablet (0.5 mg total) by mouth at bedtime as needed for sleep. 06/04/16   Carmin Muskrat, MD  amLODipine (NORVASC) 10 MG tablet Take 10 mg by mouth every morning.     [provider]  atorvastatin (LIPITOR) 20 MG tablet Take 1 tablet (20 mg total) by mouth daily at 6 PM. 07/23/12   Consuelo Pandy, PA-C  carvedilol (COREG) 25 MG tablet Take 25 mg by mouth 2 (two) times daily with a meal.    [provider]  ciprofloxacin (CIPRO) 250 MG tablet Take 1 tablet (250 mg  total) by mouth 2 (two) times daily for 14 doses. 09/15/19 09/22/19  Dorie Rank, MD  colchicine 0.6 MG tablet Take 1 tablet (0.6 mg total) by mouth 2 (two) times daily as needed (gout). 02/18/17   Harris, Abigail, PA-C  Cyanocobalamin (B-12) 1000 MCG CAPS Take 1 tablet by mouth every morning.     [provider]  diclofenac sodium (VOLTAREN) 1 % GEL Apply 2 g topically 4 (four) times daily. 06/19/17   Rancour, Annie Main, MD  ferrous sulfate 325 (65 FE) MG tablet Take 325 mg by mouth 2 (two) times daily with a meal.    [provider]  fluticasone (FLONASE) 50 MCG/ACT nasal spray Place 2 sprays into both nostrils daily. Patient not taking: Reported on 06/20/2019 02/15/16   Ward, Delice Bison, DO  furosemide (LASIX) 40 MG tablet Take 40 mg by mouth every morning.     [provider]  lisinopril (PRINIVIL,ZESTRIL) 40 MG tablet Take 40 mg by mouth daily.    [provider]  loperamide (IMODIUM) 2 MG capsule Take 1 capsule (2 mg total) by mouth 4 (four) times daily as needed for diarrhea or loose stools. 09/15/19   Long, Wonda Olds, MD  meloxicam (MOBIC) 15 MG tablet Take 1 tablet (15 mg total) by mouth daily as needed for pain. 08/02/19   Isla Pence, MD  methocarbamol (ROBAXIN) 500 MG tablet Take 1 tablet (500 mg total) by mouth every 8 (eight) hours as needed for muscle spasms. Patient not taking: Reported on 06/20/2019 06/19/17   Ezequiel Essex, MD  metroNIDAZOLE (FLAGYL) 500 MG tablet Take 1 tablet (500 mg total) by mouth 2 (two) times daily. 09/15/19   Dorie Rank, MD  omeprazole (PRILOSEC) 20 MG capsule Take 20 mg by mouth every morning.     [provider]  rivaroxaban (XARELTO) 20 MG TABS tablet Take 1 tablet (20 mg total) by mouth daily with supper. 10/30/13   Croitoru, Mihai, MD  tamsulosin (FLOMAX) 0.4 MG CAPS capsule Take 0.8 mg by mouth at bedtime.    [provider]    Allergies    Patient has no known allergies.  Review of Systems   Review of  Systems  All other systems reviewed and are negative.   Physical Exam Updated Vital Signs BP (!) 105/58 (BP Location: Right Arm)   Pulse 70   Temp 100.2 F (37.9 C) (Oral)   Ht 1.854 m (6\' 1" )   Wt 89.8 kg   SpO2 97%   BMI 26.12 kg/m   Physical Exam Vitals and nursing note reviewed.  Constitutional:      General: He is not in acute distress.    Appearance: He is well-developed.  HENT:     Head: Normocephalic and atraumatic.     Right Ear: External ear normal.     Left Ear: External ear normal.  Eyes:     General: No scleral icterus.       Right eye: No discharge.        Left eye: No discharge.     Conjunctiva/sclera: Conjunctivae normal.  Neck:     Trachea: No tracheal deviation.  Cardiovascular:     Rate and Rhythm: Normal rate and regular rhythm.  Pulmonary:     Effort: Pulmonary effort is normal. No respiratory distress.     Breath sounds: Normal breath sounds. No stridor. No wheezing or rales.  Abdominal:     General: Bowel sounds are normal. There is no distension.     Palpations: Abdomen is soft.     Tenderness: There is abdominal tenderness in the right lower quadrant, suprapubic area and left lower quadrant. There is no guarding or rebound.  Musculoskeletal:        General: No tenderness.     Cervical back: Neck supple.  Skin:    General: Skin is warm and dry.     Findings: No rash.  Neurological:     Mental Status: He is alert.     Cranial Nerves: No cranial nerve deficit (no facial droop, extraocular movements intact, no slurred speech).     Sensory: No sensory deficit.     Motor: No abnormal muscle tone or seizure activity.     Coordination: Coordination normal.     ED Results / Procedures / Treatments   Labs (all labs ordered are listed, but only abnormal results are displayed) Labs Reviewed  CBC WITH DIFFERENTIAL/PLATELET - Abnormal; Notable for the following components:      Result Value   RBC 3.42 (*)    Hemoglobin 11.3 (*)    HCT 33.7  (*)    All other components within normal limits  COMPREHENSIVE METABOLIC PANEL - Abnormal; Notable for the following components:   Sodium 134 (*)    Potassium 3.2 (*)    CO2 20 (*)    Glucose, Bld 119 (*)    Creatinine, Ser 1.50 (*)    Calcium 8.5 (*)    Albumin 3.2 (*)    GFR calc non Af Amer 40 (*)    GFR calc Af Amer 47 (*)    All other components within normal limits  SARS CORONAVIRUS 2 BY RT PCR (HOSPITAL ORDER, [place] [facility])  LIPASE, BLOOD  URINALYSIS, ROUTINE W REFLEX MICROSCOPIC    EKG None  Radiology CT ABDOMEN PELVIS W CONTRAST  Result Date: 09/15/2019 CLINICAL DATA:  Abdominal pain and mid back pain for a day EXAM: CT ABDOMEN AND PELVIS WITH CONTRAST TECHNIQUE: Multidetector CT imaging of the abdomen and pelvis was performed using the standard protocol following bolus administration of intravenous contrast. CONTRAST:  58mL OMNIPAQUE IOHEXOL 300 MG/ML  SOLN COMPARISON:  August 09, 2013 FINDINGS: Lower chest: There are increased LEFT basilar dependent ground-glass opacities. Hepatobiliary: Status post cholecystectomy with unchanged central biliary ductal dilation since 2015. Common bile duct measures approximately 11 mm. Pancreas: Unremarkable. No pancreatic ductal dilatation or surrounding inflammatory changes. Spleen: Normal in size without focal abnormality. Adrenals/Urinary Tract: Adrenal glands are unremarkable. Revisualization of several indeterminate renal lesions. LEFT upper in the anterior renal lesion measures 2.2 x 2.1 by 1.6 cm, previously 1.9 x 1.6 by 1.1 cm (series 2, image 21; series 5, image 53). Indeterminate lesion of the inferior pole of the LEFT kidney measures 2.0 x 1.9 by 1.8 cm, previously 1.4 x 1.4 by 1.1 cm (series 2, image 35; series 5, image  57). Indeterminate lesion of the interpolar medial LEFT kidney measures 11 mm, previously 6 mm (series 2, image 27). Indeterminate lesion of the lateral inferior RIGHT kidney with an enhancing  septum measures 2.3 cm, previously 2 cm (series 2, image 36). Additional indeterminate lesion of the inferior pole with thin enhancing septation measures 19 mm, previously 15 mm (series 2, image 41). Additional subcentimeter hypodense lesions are too small to accurately characterize. No hydronephrosis. Bladder is decompressed. Stomach/Bowel: Small hiatal hernia. Status post RIGHT colonic surgery. Colon is fluid filled with mild subjective diffuse bowel wall thickening. Scattered diverticulosis. No evidence of bowel obstruction. Vascular/Lymphatic: RIGHT external iliac lymph node measures 14 mm in the short axis, previously 3 mm (series 2, image 66). LEFT internal iliac lymph node measures 7 mm, not previously visualized (series 2, image 66). 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 This is unchanged. Reproductive: Brachytherapy seeds. Other: No free fluid. Musculoskeletal: Degenerative changes of the lumbar spine. Transitional anatomy with a LEFT greater than RIGHT-sided assimilation joint at L5-S1 and rib lets at T12. Advanced degenerative changes of the RIGHT hip. Bone island of the RIGHT pubic symphysis. New tiny focus of sclerosis in the LEFT iliac bone, nonspecific (series 2, image 55). New sclerotic lesion of the RIGHT aspect of the L1 vertebral body (series 2, image 29 IMPRESSION: 1. Colon is fluid-filled with mild subjective diffuse bowel wall thickening carotid findings could reflect a colitis in the appropriate clinical setting. 2. Revisualization of several indeterminate renal lesions, some of which have mildly increased in size compared to the prior study. Findings are concerning for renal cell carcinoma. Further evaluation with renal protocol CT or MRI is recommended. 3. New pelvic lymphadenopathy as well as new sclerotic lesions in the RIGHT  aspect of the L1 vertebral body and the LEFT iliac bone. Given history of prostate cancer, findings are concerning for nodal and osseous metastatic disease. Recommend correlation with PSA. 4. Increased LEFT basilar dependent ground-glass opacities may represent atelectasis or pneumonia. Aortic Atherosclerosis (ICD10-I70.0). Electronically Signed   By: Valentino Saxon MD   On: 09/15/2019 11:26    Procedures Procedures (including critical care time)  Medications Ordered in ED Medications  sodium chloride 0.9 % bolus 500 mL (500 mLs Intravenous New Bag/Given 09/15/19 2302)    Followed by  0.9 %  sodium chloride infusion (has no administration in time range)  ciprofloxacin (CIPRO) IVPB 400 mg (has no administration in time range)  metroNIDAZOLE (FLAGYL) tablet 500 mg (has no administration in time range)  morphine 4 MG/ML injection 4 mg (4 mg Intravenous Given 09/15/19 2301)  ondansetron (ZOFRAN) injection 4 mg (4 mg Intravenous Given 09/15/19 2301)    ED Course  I have reviewed the triage vital signs and the nursing notes.  Pertinent labs & imaging results that were available during my care of the patient were reviewed by me and considered in my medical decision making (see chart for details).  Clinical Course as of Sep 15 2343  Fri Sep 15, 2019  2325 White blood cell count with slight leukocytosis.   [JK]  2325 Hemoglobin slightly decreased from earlier today.   [JK]  6160 Metabolic panel not significantly changed   [JK]  2329 Pt states he is feeling better.  Would like to try and eat something   [JK]    Clinical Course User Index [JK] Tomi Bamberger,  Wille Glaser, MD   MDM Rules/Calculators/A&P                          Pt with evaluation earlier today.  CT scan showed findings concerning for malignancy.  Questionable colitis as well.  Pt has not had any vomiting in the ED.  No recurrent diarrhea here.  Low grade temp.  Could be from malignancy versus colitis.   Pt would like to try and eat and  drink and see if he can go home.  Will give dose of abx.  Consider dc if tolerating po. Care turned over to Dr Rolland Porter Final Clinical Impression(s) / ED Diagnoses Final diagnoses:  Renal mass  Lytic bone lesions on xray    Rx / DC Orders ED Discharge Orders         Ordered    ciprofloxacin (CIPRO) 250 MG tablet  2 times daily     Discontinue  Reprint     09/15/19 2344    metroNIDAZOLE (FLAGYL) 500 MG tablet  2 times daily     Discontinue  Reprint     09/15/19 2344           Dorie Rank, MD 09/15/19 2345

## 2019-09-16 ENCOUNTER — Observation Stay (HOSPITAL_COMMUNITY): Payer: No Typology Code available for payment source

## 2019-09-16 ENCOUNTER — Inpatient Hospital Stay (HOSPITAL_COMMUNITY): Payer: No Typology Code available for payment source

## 2019-09-16 DIAGNOSIS — R590 Localized enlarged lymph nodes: Secondary | ICD-10-CM | POA: Diagnosis present

## 2019-09-16 DIAGNOSIS — Z7901 Long term (current) use of anticoagulants: Secondary | ICD-10-CM | POA: Diagnosis not present

## 2019-09-16 DIAGNOSIS — A045 Campylobacter enteritis: Secondary | ICD-10-CM | POA: Diagnosis present

## 2019-09-16 DIAGNOSIS — K219 Gastro-esophageal reflux disease without esophagitis: Secondary | ICD-10-CM | POA: Diagnosis present

## 2019-09-16 DIAGNOSIS — N2889 Other specified disorders of kidney and ureter: Secondary | ICD-10-CM | POA: Diagnosis present

## 2019-09-16 DIAGNOSIS — R110 Nausea: Secondary | ICD-10-CM

## 2019-09-16 DIAGNOSIS — R001 Bradycardia, unspecified: Secondary | ICD-10-CM | POA: Diagnosis present

## 2019-09-16 DIAGNOSIS — M109 Gout, unspecified: Secondary | ICD-10-CM | POA: Diagnosis present

## 2019-09-16 DIAGNOSIS — Z8546 Personal history of malignant neoplasm of prostate: Secondary | ICD-10-CM | POA: Diagnosis not present

## 2019-09-16 DIAGNOSIS — I440 Atrioventricular block, first degree: Secondary | ICD-10-CM | POA: Diagnosis present

## 2019-09-16 DIAGNOSIS — M545 Low back pain: Secondary | ICD-10-CM

## 2019-09-16 DIAGNOSIS — E785 Hyperlipidemia, unspecified: Secondary | ICD-10-CM

## 2019-09-16 DIAGNOSIS — Z8679 Personal history of other diseases of the circulatory system: Secondary | ICD-10-CM

## 2019-09-16 DIAGNOSIS — K529 Noninfective gastroenteritis and colitis, unspecified: Secondary | ICD-10-CM

## 2019-09-16 DIAGNOSIS — N289 Disorder of kidney and ureter, unspecified: Secondary | ICD-10-CM

## 2019-09-16 DIAGNOSIS — Z20822 Contact with and (suspected) exposure to covid-19: Secondary | ICD-10-CM | POA: Diagnosis present

## 2019-09-16 DIAGNOSIS — I10 Essential (primary) hypertension: Secondary | ICD-10-CM | POA: Diagnosis present

## 2019-09-16 DIAGNOSIS — D509 Iron deficiency anemia, unspecified: Secondary | ICD-10-CM | POA: Diagnosis present

## 2019-09-16 DIAGNOSIS — Z9049 Acquired absence of other specified parts of digestive tract: Secondary | ICD-10-CM | POA: Diagnosis not present

## 2019-09-16 DIAGNOSIS — Z79899 Other long term (current) drug therapy: Secondary | ICD-10-CM | POA: Diagnosis not present

## 2019-09-16 DIAGNOSIS — E876 Hypokalemia: Secondary | ICD-10-CM | POA: Diagnosis present

## 2019-09-16 DIAGNOSIS — R109 Unspecified abdominal pain: Secondary | ICD-10-CM

## 2019-09-16 DIAGNOSIS — E8809 Other disorders of plasma-protein metabolism, not elsewhere classified: Secondary | ICD-10-CM

## 2019-09-16 DIAGNOSIS — N179 Acute kidney failure, unspecified: Secondary | ICD-10-CM

## 2019-09-16 DIAGNOSIS — N400 Enlarged prostate without lower urinary tract symptoms: Secondary | ICD-10-CM | POA: Diagnosis present

## 2019-09-16 DIAGNOSIS — I480 Paroxysmal atrial fibrillation: Secondary | ICD-10-CM | POA: Diagnosis present

## 2019-09-16 DIAGNOSIS — Z791 Long term (current) use of non-steroidal anti-inflammatories (NSAID): Secondary | ICD-10-CM | POA: Diagnosis not present

## 2019-09-16 DIAGNOSIS — Z950 Presence of cardiac pacemaker: Secondary | ICD-10-CM | POA: Diagnosis not present

## 2019-09-16 DIAGNOSIS — Z87891 Personal history of nicotine dependence: Secondary | ICD-10-CM | POA: Diagnosis not present

## 2019-09-16 HISTORY — DX: Noninfective gastroenteritis and colitis, unspecified: K52.9

## 2019-09-16 LAB — COMPREHENSIVE METABOLIC PANEL
ALT: 37 U/L (ref 0–44)
AST: 47 U/L — ABNORMAL HIGH (ref 15–41)
Albumin: 3 g/dL — ABNORMAL LOW (ref 3.5–5.0)
Alkaline Phosphatase: 69 U/L (ref 38–126)
Anion gap: 9 (ref 5–15)
BUN: 16 mg/dL (ref 8–23)
CO2: 20 mmol/L — ABNORMAL LOW (ref 22–32)
Calcium: 8 mg/dL — ABNORMAL LOW (ref 8.9–10.3)
Chloride: 104 mmol/L (ref 98–111)
Creatinine, Ser: 1.49 mg/dL — ABNORMAL HIGH (ref 0.61–1.24)
GFR calc Af Amer: 47 mL/min — ABNORMAL LOW (ref >60)
GFR calc non Af Amer: 40 mL/min — ABNORMAL LOW (ref >60)
Glucose, Bld: 90 mg/dL (ref 70–99)
Potassium: 3.2 mmol/L — ABNORMAL LOW (ref 3.5–5.1)
Sodium: 133 mmol/L — ABNORMAL LOW (ref 135–145)
Total Bilirubin: 0.8 mg/dL (ref 0.3–1.2)
Total Protein: 6.1 g/dL — ABNORMAL LOW (ref 6.5–8.1)

## 2019-09-16 LAB — URINE CULTURE: Culture: NO GROWTH

## 2019-09-16 LAB — CBC
HCT: 32.9 % — ABNORMAL LOW (ref 39.0–52.0)
Hemoglobin: 10.7 g/dL — ABNORMAL LOW (ref 13.0–17.0)
MCH: 33.1 pg (ref 26.0–34.0)
MCHC: 32.5 g/dL (ref 30.0–36.0)
MCV: 101.9 fL — ABNORMAL HIGH (ref 80.0–100.0)
Platelets: 128 10*3/uL — ABNORMAL LOW (ref 150–400)
RBC: 3.23 MIL/uL — ABNORMAL LOW (ref 4.22–5.81)
RDW: 13.7 % (ref 11.5–15.5)
WBC: 6.4 10*3/uL (ref 4.0–10.5)
nRBC: 0 % (ref 0.0–0.2)

## 2019-09-16 LAB — PHOSPHORUS: Phosphorus: 2.8 mg/dL (ref 2.5–4.6)

## 2019-09-16 LAB — PROTIME-INR
INR: 1.4 — ABNORMAL HIGH (ref 0.8–1.2)
Prothrombin Time: 16.4 seconds — ABNORMAL HIGH (ref 11.4–15.2)

## 2019-09-16 LAB — MAGNESIUM: Magnesium: 1.3 mg/dL — ABNORMAL LOW (ref 1.7–2.4)

## 2019-09-16 LAB — APTT: aPTT: 31 seconds (ref 24–36)

## 2019-09-16 LAB — PSA: Prostatic Specific Antigen: 4.51 ng/mL — ABNORMAL HIGH (ref 0.00–4.00)

## 2019-09-16 MED ORDER — MAGNESIUM SULFATE 2 GM/50ML IV SOLN
2.0000 g | Freq: Once | INTRAVENOUS | Status: AC
  Administered 2019-09-16: 2 g via INTRAVENOUS
  Filled 2019-09-16: qty 50

## 2019-09-16 MED ORDER — MORPHINE SULFATE (PF) 2 MG/ML IV SOLN
2.0000 mg | INTRAVENOUS | Status: DC | PRN
  Administered 2019-09-16: 2 mg via INTRAVENOUS
  Filled 2019-09-16: qty 1

## 2019-09-16 MED ORDER — PROSOURCE PLUS PO LIQD
30.0000 mL | Freq: Two times a day (BID) | ORAL | Status: DC
  Administered 2019-09-17 – 2019-09-18 (×3): 30 mL via ORAL
  Filled 2019-09-16 (×3): qty 30

## 2019-09-16 MED ORDER — HEPARIN SODIUM (PORCINE) 5000 UNIT/ML IJ SOLN: 5000.0000 [IU] | Freq: Three times a day (TID) | INTRAMUSCULAR | Status: DC

## 2019-09-16 MED ORDER — ATORVASTATIN CALCIUM 20 MG PO TABS
20.0000 mg | ORAL_TABLET | Freq: Every day | ORAL | Status: DC
  Administered 2019-09-16 – 2019-09-17 (×2): 20 mg via ORAL
  Filled 2019-09-16 (×2): qty 1

## 2019-09-16 MED ORDER — RIVAROXABAN 15 MG PO TABS
15.0000 mg | ORAL_TABLET | Freq: Every day | ORAL | Status: DC
  Administered 2019-09-16 – 2019-09-17 (×2): 15 mg via ORAL
  Filled 2019-09-16 (×2): qty 1

## 2019-09-16 MED ORDER — PIPERACILLIN-TAZOBACTAM 3.375 G IVPB
3.3750 g | Freq: Three times a day (TID) | INTRAVENOUS | Status: DC
  Administered 2019-09-16 – 2019-09-17 (×5): 3.375 g via INTRAVENOUS
  Filled 2019-09-16 (×5): qty 50

## 2019-09-16 MED ORDER — SODIUM CHLORIDE 0.9 % IV SOLN: Freq: Once | INTRAVENOUS | Status: DC

## 2019-09-16 MED ORDER — ENSURE ENLIVE PO LIQD: 237.0000 mL | Freq: Two times a day (BID) | ORAL | Status: DC

## 2019-09-16 MED ORDER — CARVEDILOL 12.5 MG PO TABS
25.0000 mg | ORAL_TABLET | Freq: Two times a day (BID) | ORAL | Status: DC
  Administered 2019-09-16 – 2019-09-18 (×5): 25 mg via ORAL
  Filled 2019-09-16 (×5): qty 2

## 2019-09-16 MED ORDER — PANTOPRAZOLE SODIUM 40 MG PO TBEC
40.0000 mg | DELAYED_RELEASE_TABLET | Freq: Every day | ORAL | Status: DC
  Administered 2019-09-16 – 2019-09-18 (×3): 40 mg via ORAL
  Filled 2019-09-16 (×3): qty 1

## 2019-09-16 MED ORDER — ACETAMINOPHEN 325 MG PO TABS
650.0000 mg | ORAL_TABLET | Freq: Four times a day (QID) | ORAL | Status: DC | PRN
  Administered 2019-09-16 – 2019-09-17 (×2): 650 mg via ORAL
  Filled 2019-09-16 (×2): qty 2

## 2019-09-16 MED ORDER — AMLODIPINE BESYLATE 5 MG PO TABS
10.0000 mg | ORAL_TABLET | Freq: Every morning | ORAL | Status: DC
  Administered 2019-09-16 – 2019-09-18 (×3): 10 mg via ORAL
  Filled 2019-09-16 (×3): qty 2

## 2019-09-16 MED ORDER — BOOST / RESOURCE BREEZE PO LIQD CUSTOM
1.0000 | Freq: Three times a day (TID) | ORAL | Status: DC
  Administered 2019-09-16 – 2019-09-18 (×6): 1 via ORAL

## 2019-09-16 MED ORDER — ONDANSETRON HCL 4 MG PO TABS: 4.0000 mg | ORAL_TABLET | Freq: Four times a day (QID) | ORAL | Status: DC | PRN

## 2019-09-16 MED ORDER — GUAIFENESIN-DM 100-10 MG/5ML PO SYRP
5.0000 mL | ORAL_SOLUTION | ORAL | Status: DC | PRN
  Administered 2019-09-16 – 2019-09-17 (×2): 5 mL via ORAL
  Filled 2019-09-16 (×2): qty 5

## 2019-09-16 MED ORDER — ACETAMINOPHEN 650 MG RE SUPP: 650.0000 mg | Freq: Four times a day (QID) | RECTAL | Status: DC | PRN

## 2019-09-16 MED ORDER — PIPERACILLIN-TAZOBACTAM 3.375 G IVPB 30 MIN: 3.3750 g | Freq: Three times a day (TID) | INTRAVENOUS | Status: DC

## 2019-09-16 MED ORDER — ONDANSETRON HCL 4 MG/2ML IJ SOLN
4.0000 mg | Freq: Four times a day (QID) | INTRAMUSCULAR | Status: DC | PRN
  Administered 2019-09-17: 4 mg via INTRAVENOUS
  Filled 2019-09-16: qty 2

## 2019-09-16 MED ORDER — IOHEXOL 350 MG/ML SOLN
80.0000 mL | Freq: Once | INTRAVENOUS | Status: AC | PRN
  Administered 2019-09-16: 80 mL via INTRAVENOUS

## 2019-09-16 MED ORDER — POTASSIUM CHLORIDE CRYS ER 20 MEQ PO TBCR
40.0000 meq | EXTENDED_RELEASE_TABLET | Freq: Two times a day (BID) | ORAL | Status: AC
  Administered 2019-09-16 (×2): 40 meq via ORAL
  Filled 2019-09-16 (×2): qty 2

## 2019-09-16 MED ORDER — POTASSIUM CHLORIDE CRYS ER 20 MEQ PO TBCR
40.0000 meq | EXTENDED_RELEASE_TABLET | Freq: Once | ORAL | Status: AC
  Administered 2019-09-16: 40 meq via ORAL
  Filled 2019-09-16: qty 2

## 2019-09-16 MED ORDER — SODIUM CHLORIDE 0.9 % IV SOLN: INTRAVENOUS | Status: AC

## 2019-09-16 NOTE — Progress Notes (Signed)
Pt sat 83% on RA. Placed on 2L [place], pt sat 94%

## 2019-09-16 NOTE — Progress Notes (Signed)
Initial Nutrition Assessment  RD working remotely.  DOCUMENTATION CODES:   Not applicable  INTERVENTION:   - Boost Breeze po TID, each supplement provides 250 kcal and 9 grams of protein  - ProSource Plus 30 ml po BID, each supplement provides 100 kcal and 15 grams of protein  NUTRITION DIAGNOSIS:   Increased nutrient needs related to acute illness (colitis) as evidenced by estimated needs.  GOAL:   Patient will meet greater than or equal to 90% of their needs  MONITOR:   PO intake, Supplement acceptance, Labs, Weight trends, I & O's  REASON FOR ASSESSMENT:   Malnutrition Screening Tool    ASSESSMENT:   86 year old male with PMH of HTN, HLD, GERD, pacemaker placement secondary to symptomatic bradycardia. Pt presented to the ED with abdominal pain. Pt admitted with colitis.   Of note, CT abdomen and pelvis showing several indeterminate renal lesions concerning for renal cell carcinoma. CT abdomen and pelvis also showing new pelvic lymphadenopathy and new sclerotic lesions in the right aspect of the L1 vertebral body and left iliac bone with concern for nodal and osseous metastatic disease due to pt's history of prostate cancer.  Diet advanced from clear liquids to Heart Healthy today. No meal completions documented.  RD attempted to speak with pt via phone call to room; however, no answer.  Noted pt refusing Ensure Enlive supplements per [facility] documentation. RD will d/c these and order ProSource along with Boost Breeze to aid pt in meeting kcal and protein needs. Pt is accepting Boost Breeze supplements per [facility] documentation.  Reviewed weight history in chart. Pt with a 10 kg weight loss since 06/20/19. This is a 10% weight loss in less than 3 months which is severe and significant for timeframe. Pt is at risk for malnutrition and may already meet criteria, but RD unable to confirm without detailed diet history or NFPE.  Medications reviewed and include: Boost Breeze TID,  Ensure Enlive BID, protonix, Klor-con 40 mEq x 2 doses, IV abx IVF: NS @ 100 ml/hr  Labs reviewed: sodium 133, potassium 3.2, magnesium 1.3 CBG's: 131  NUTRITION - FOCUSED PHYSICAL EXAM:  Unable to complete at this time. RD working remotely.  Diet Order:   Diet Order            Diet Heart Room service appropriate? Yes; Fluid consistency: Thin  Diet effective now                 EDUCATION NEEDS:   Not appropriate for education at this time  Skin:  Skin Assessment: Reviewed RN Assessment  Last BM:  09/16/19  Height:   Ht Readings from Last 1 Encounters:  09/15/19 6\' 1"  (1.854 m)    Weight:   Wt Readings from Last 1 Encounters:  09/15/19 89.8 kg    Ideal Body Weight:  83.6 kg  BMI:  Body mass index is 26.12 kg/m.  Estimated Nutritional Needs:   Kcal:  2000-2200  Protein:  95-110 grams  Fluid:  >/= 2.0 L    Gaynell Face, MS, RD, LDN Inpatient Clinical Dietitian Please see AMiON for contact information.

## 2019-09-16 NOTE — Progress Notes (Signed)
Per HPI: Derrick Long is a 86 y.o. 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  Patient was seen in the ED yesterday in the morning with complaint of right flank pain and 1-2 new onset nonbloody diarrhea.  He endorsed 1 year history of intermittent right flank pain and follows primarily at the [place].  CT abdomen and pelvis with contrast was done and was suspicious for colitis.  He was prescribed with Imodium and discharged home.  Patient returned in the evening  complaining of persistent nausea and inability to eat since being discharged from the ED in the morning, he also complained of about 5 episodes of loose stool which did not improve with Imodium.  He complained of lower abdominal pain that was rated as 8/10 on pain scale, so he returned to the ED for further evaluation.  He denies fever, chills, chest pain, shortness of breath.  -Patient was admitted with abdominal pain and nausea in the setting of acute colitis and has been started on Zosyn after 1 dose of IV Flagyl and ciprofloxacin in ED.  He appears to be doing much better this morning and is tolerating clear liquids.  Plan to advance diet further today and maintain on IV fluid and replete potassium as well as magnesium and recheck labs in a.m.  PSA noted to be elevated 4.51.  Patient will need further imaging to characterize renal mass.  Will order CT scan with renal protocol.  Total care time: 20 minutes.

## 2019-09-16 NOTE — Progress Notes (Signed)
PICC nurse inserted #[id number] peripheral in [place].  Order reentered for CT scan and CT team contacted.

## 2019-09-16 NOTE — ED Provider Notes (Signed)
Patient left a change of shift to see how he would do with an oral challenge.  Patient presented twice today for abdominal pain and diarrhea for 1 to 2 days.  He did have some colitis on his CT done earlier today.  When I go in the room patient has only eaten 1 or 2 small squares of saltine crackers and a small amount of peanut butter.  He states he did make his stomach hurt more but he did not eat very much.  Have talked to the patient and his son at bedside and they are agreeable to admission.  02:07 AM Dr Josephine Cables, hospitalist, will admit.   CT ABDOMEN PELVIS W CONTRAST  Result Date: 09/15/2019 CLINICAL DATA:  Abdominal pain and mid back pain for a day EXAM: CT ABDOMEN AND PELVIS WITH CONTRAST TECHNIQUE: Multidetector CT imaging of the abdomen and pelvis was performed using the standard protocol following bolus administration of intravenous contrast. CONTRAST:  37mL OMNIPAQUE IOHEXOL 300 MG/ML  SOLN COMPARISON:  August 09, 2013 FINDINGS: Lower chest: There are increased LEFT basilar dependent ground-glass opacities. Hepatobiliary: Status post cholecystectomy with unchanged central biliary ductal dilation since 2015. Common bile duct measures approximately 11 mm. Pancreas: Unremarkable. No pancreatic ductal dilatation or surrounding inflammatory changes. Spleen: Normal in size without focal abnormality. Adrenals/Urinary Tract: Adrenal glands are unremarkable. Revisualization of several indeterminate renal lesions. LEFT upper in the anterior renal lesion measures 2.2 x 2.1 by 1.6 cm, previously 1.9 x 1.6 by 1.1 cm (series 2, image 21; series 5, image 53). Indeterminate lesion of the inferior pole of the LEFT kidney measures 2.0 x 1.9 by 1.8 cm, previously 1.4 x 1.4 by 1.1 cm (series 2, image 35; series 5, image 57). Indeterminate lesion of the interpolar medial LEFT kidney measures 11 mm, previously 6 mm (series 2, image 27). Indeterminate lesion of the lateral inferior RIGHT kidney with an enhancing septum  measures 2.3 cm, previously 2 cm (series 2, image 36). Additional indeterminate lesion of the inferior pole with thin enhancing septation measures 19 mm, previously 15 mm (series 2, image 41). Additional subcentimeter hypodense lesions are too small to accurately characterize. No hydronephrosis. Bladder is decompressed. Stomach/Bowel: Small hiatal hernia. Status post RIGHT colonic surgery. Colon is fluid filled with mild subjective diffuse bowel wall thickening. Scattered diverticulosis. No evidence of bowel obstruction. Vascular/Lymphatic: RIGHT external iliac lymph node measures 14 mm in the short axis, previously 3 mm (series 2, image 66). LEFT internal iliac lymph node measures 7 mm, not previously visualized (series 2, image 66). 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 This is unchanged. Reproductive: Brachytherapy seeds. Other: No free fluid. Musculoskeletal: Degenerative changes of the lumbar spine. Transitional anatomy with a LEFT greater than RIGHT-sided assimilation joint at L5-S1 and rib lets at T12. Advanced degenerative changes of the RIGHT hip. Bone island of the RIGHT pubic symphysis. New tiny focus of sclerosis in the LEFT iliac bone, nonspecific (series 2, image 55). New sclerotic lesion of the RIGHT aspect of the L1 vertebral body (series 2, image 29 IMPRESSION: 1. Colon is fluid-filled with mild subjective diffuse bowel wall thickening carotid findings could reflect a colitis in the appropriate clinical setting. 2. Revisualization of several indeterminate renal lesions, some of which have mildly increased in size compared to the prior study. Findings are concerning for renal cell carcinoma. Further evaluation with renal protocol  CT or MRI is recommended. 3. New pelvic lymphadenopathy as well as new sclerotic lesions in the RIGHT aspect  of the L1 vertebral body and the LEFT iliac bone. Given history of prostate cancer, findings are concerning for nodal and osseous metastatic disease. Recommend correlation with PSA. 4. Increased LEFT basilar dependent ground-glass opacities may represent atelectasis or pneumonia. Aortic Atherosclerosis (ICD10-I70.0). Electronically Signed   By: Valentino Saxon MD   On: 09/15/2019 11:26   Diagnoses that have been ruled out:  None  Diagnoses that are still under consideration:  None  Final diagnoses:  Renal mass  Lytic bone lesions on xray  Generalized abdominal pain  Diarrhea, unspecified type  Colitis  Hypokalemia   Plan admission  Rolland Porter, MD, Barbette Or, MD 09/16/19 [phone]

## 2019-09-16 NOTE — H&P (Addendum)
History and Physical  Derrick Long HER:[id number] DOB: [birth year] DOA: 09/15/2019  Referring physician: Rolland Porter Long PCP: [facility], Derrick Long  Patient coming from: Home  Chief Complaint: Abdominal pain  HPI: Derrick Long is a 86 y.o. 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  Patient was seen in the ED yesterday in the morning with complaint of right flank pain and 1-2 new onset nonbloody diarrhea.  He endorsed 1 year history of intermittent right flank pain and follows primarily at the [place].  CT abdomen and pelvis with contrast was done and was suspicious for colitis.  He was prescribed with Imodium and discharged home.  Patient returned in the evening  complaining of persistent nausea and inability to eat since being discharged from the ED in the morning, he also complained of about 5 episodes of loose stool which did not improve with Imodium.  He complained of lower abdominal pain that was rated as 8/10 on pain scale, so he returned to the ED for further evaluation.  He denies fever, chills, chest pain, shortness of breath  ED Course:  In the emergency department, initial temperature was 100.48F and other vital signs were within normal range.  Work-up in the ED showed normocytic anemia, hypokalemia, BUN/creatinine 19/1.43 with subsequent creatinine at 1.50 (baseline creatinine at 1.26-1.27), Albumin 3.2.  Urinalysis was negative for UTI, SARS coronavirus 2 was negative.  IV morphine 4 Mg x1 due to abdominal pain, IV Zofran due to nausea and IV antibiotics (Cipro and Flagyl) were given.  Patient was provided with IV hydration.  Hospitalist was asked to admit patient for further evaluation and management.  Review of Systems: Constitutional: Negative for chills and fever.  HENT: Negative for ear pain and sore throat.   Eyes:  Negative for pain and visual disturbance.  Respiratory: Negative for cough, chest tightness and shortness of breath.   Cardiovascular: Negative for chest pain and palpitations.  Gastrointestinal: Positive for abdominal pain and nausea.  Negative for vomiting.  Endocrine: Negative for polyphagia and polyuria.  Genitourinary: Negative for decreased urine volume, dysuria Musculoskeletal: Positive for low back pain.  Negative for arthralgias  Skin: Negative for color change and rash.  Allergic/Immunologic: Negative for immunocompromised state.  Neurological: Negative for tremors, syncope, speech difficulty, weakness, light-headedness and headaches.  Hematological: Does not bruise/bleed easily.  All other systems reviewed and are negative   Past Medical History:  Diagnosis Date  . Acid reflux   . Anemia   . Ex-cigarette smoker   . Gout   . Hyperlipemia   . Hypertension   . Prostate cancer ([place]) 6.23.2015   SEEDS 20 YEARS AGO  . Prostate disease    Past Surgical History:  Procedure Laterality Date  . LEFT HEART CATHETERIZATION WITH CORONARY ANGIOGRAM N/A 07/21/2012   Procedure: LEFT HEART CATHETERIZATION WITH CORONARY ANGIOGRAM;  Surgeon: Derrick Long;  Location: [facility] CATH LAB;  Service: Cardiovascular;  Laterality: N/A;  . PACEMAKER INSERTION    . PERMANENT PACEMAKER INSERTION N/A 07/22/2012   Procedure: PERMANENT PACEMAKER INSERTION;  Surgeon: Derrick Long;  Location: [facility] CATH LAB;  Service: Cardiovascular;  Laterality: N/A;  . PROSTATE SURGERY      Social History:  reports that he quit smoking about 17 years ago. He has never used smokeless tobacco. He reports that he does not drink alcohol and does not use drugs.   No Known  Allergies  No family history on file.   Prior to Admission medications   Medication Sig Start Date End Date Taking? Authorizing Provider  alfuzosin (UROXATRAL) 10 MG 24 hr tablet Take 10 mg by mouth at bedtime.     Provider, Historical, Long   ALPRAZolam Derrick Long) 0.5 MG tablet Take 1 tablet (0.5 mg total) by mouth at bedtime as needed for sleep. 06/04/16   Carmin Derrick Long  amLODipine (NORVASC) 10 MG tablet Take 10 mg by mouth every morning.     Provider, Historical, Long  atorvastatin (LIPITOR) 20 MG tablet Take 1 tablet (20 mg total) by mouth daily at 6 PM. 07/23/12   Derrick Long  carvedilol (COREG) 25 MG tablet Take 25 mg by mouth 2 (two) times daily with a meal.    Provider, Historical, Long  ciprofloxacin (CIPRO) 250 MG tablet Take 1 tablet (250 mg total) by mouth 2 (two) times daily for 14 doses. 09/15/19 09/22/19  Derrick Long  colchicine 0.6 MG tablet Take 1 tablet (0.6 mg total) by mouth 2 (two) times daily as needed (gout). 02/18/17   Harris, Abigail, Long  Cyanocobalamin (B-12) 1000 MCG CAPS Take 1 tablet by mouth every morning.     Provider, Historical, Long  diclofenac sodium (VOLTAREN) 1 % GEL Apply 2 g topically 4 (four) times daily. 06/19/17   Rancour, Derrick Long  ferrous sulfate 325 (65 FE) MG tablet Take 325 mg by mouth 2 (two) times daily with a meal.    Provider, Historical, Long  fluticasone (FLONASE) 50 MCG/ACT nasal spray Place 2 sprays into both nostrils daily. Patient not taking: Reported on 06/20/2019 02/15/16   Ward, Derrick Long  furosemide (LASIX) 40 MG tablet Take 40 mg by mouth every morning.     Provider, Historical, Long  lisinopril (PRINIVIL,ZESTRIL) 40 MG tablet Take 40 mg by mouth daily.    Provider, Historical, Long  loperamide (IMODIUM) 2 MG capsule Take 1 capsule (2 mg total) by mouth 4 (four) times daily as needed for diarrhea or loose stools. 09/15/19   Long, Derrick Long  meloxicam (MOBIC) 15 MG tablet Take 1 tablet (15 mg total) by mouth daily as needed for pain. 08/02/19   Derrick Long  methocarbamol (ROBAXIN) 500 MG tablet Take 1 tablet (500 mg total) by mouth every 8 (eight) hours as needed for muscle spasms. Patient not taking: Reported on 06/20/2019 06/19/17   Derrick Long   metroNIDAZOLE (FLAGYL) 500 MG tablet Take 1 tablet (500 mg total) by mouth 2 (two) times daily. 09/15/19   Derrick Long  omeprazole (PRILOSEC) 20 MG capsule Take 20 mg by mouth every morning.     Provider, Historical, Long  rivaroxaban (XARELTO) 20 MG TABS tablet Take 1 tablet (20 mg total) by mouth daily with supper. 10/30/13   Croitoru, Mihai, Long  tamsulosin (FLOMAX) 0.4 MG CAPS capsule Take 0.8 mg by mouth at bedtime.    Provider, Historical, Long    Physical Exam: BP (!) 127/56 (BP Location: Left Arm)   Pulse 70   Temp 100 F (37.8 C) (Oral)   Resp 19   Ht 6\' 1"  (1.854 m)   Wt 89.8 kg   SpO2 94%   BMI 26.12 kg/m   . General: 86 y.o. year-old male well developed well nourished in no acute distress.  Alert and oriented x3. Marland Kitchen HEENT: NCAT, EOMI, PERRLA . Neck: Supple, trachea midline . Cardiovascular: Regular rate and rhythm with no rubs or gallops.  No thyromegaly or JVD noted.  No lower extremity edema. 2/4 pulses in all 4 extremities. Marland Kitchen Respiratory: Clear to auscultation with no wheezes or rales. Good inspiratory effort. . Abdomen: Soft, tender to palpation in lower quadrants with no guarding or rebound.  . Muskuloskeletal: No cyanosis, clubbing or edema noted bilaterally . Neuro: CN II-XII intact, strength, sensation, reflexes . Skin: No ulcerative lesions noted or rashes . Psychiatry: Judgement and insight appear normal. Mood is appropriate for condition and setting          Labs on Admission:  Basic Metabolic Panel: Recent Labs  Lab 09/15/19 0907 09/15/19 2240  NA 136 134*  K 3.4* 3.2*  CL 104 103  CO2 24 20*  GLUCOSE 116* 119*  BUN 19 18  CREATININE 1.43* 1.50*  CALCIUM 8.9 8.5*   Liver Function Tests: Recent Labs  Lab 09/15/19 0907 09/15/19 2240  AST 18 18  ALT 13 13  ALKPHOS 58 52  BILITOT 1.1 0.9  PROT 7.2 6.7  ALBUMIN 3.6 3.2*   Recent Labs  Lab 09/15/19 0908 09/15/19 2240  LIPASE 24 20   No results for input(s): AMMONIA in the last 168  hours. CBC: Recent Labs  Lab 09/15/19 0907 09/15/19 2240  WBC 5.8 7.1  NEUTROABS 4.5 5.6  HGB 12.4* 11.3*  HCT 37.9* 33.7*  MCV 102.2* 98.5  PLT 159 154   Cardiac Enzymes: No results for input(s): CKTOTAL, CKMB, CKMBINDEX, TROPONINI in the last 168 hours.  BNP (last 3 results) No results for input(s): BNP in the last 8760 hours.  ProBNP (last 3 results) No results for input(s): PROBNP in the last 8760 hours.  CBG: No results for input(s): GLUCAP in the last 168 hours.  Radiological Exams on Admission: CT ABDOMEN PELVIS W CONTRAST  Result Date: 09/15/2019 CLINICAL DATA:  Abdominal pain and mid back pain for a day EXAM: CT ABDOMEN AND PELVIS WITH CONTRAST TECHNIQUE: Multidetector CT imaging of the abdomen and pelvis was performed using the standard protocol following bolus administration of intravenous contrast. CONTRAST:  93mL OMNIPAQUE IOHEXOL 300 MG/ML  SOLN COMPARISON:  August 09, 2013 FINDINGS: Lower chest: There are increased LEFT basilar dependent ground-glass opacities. Hepatobiliary: Status post cholecystectomy with unchanged central biliary ductal dilation since 2015. Common bile duct measures approximately 11 mm. Pancreas: Unremarkable. No pancreatic ductal dilatation or surrounding inflammatory changes. Spleen: Normal in size without focal abnormality. Adrenals/Urinary Tract: Adrenal glands are unremarkable. Revisualization of several indeterminate renal lesions. LEFT upper in the anterior renal lesion measures 2.2 x 2.1 by 1.6 cm, previously 1.9 x 1.6 by 1.1 cm (series 2, image 21; series 5, image 53). Indeterminate lesion of the inferior pole of the LEFT kidney measures 2.0 x 1.9 by 1.8 cm, previously 1.4 x 1.4 by 1.1 cm (series 2, image 35; series 5, image 57). Indeterminate lesion of the interpolar medial LEFT kidney measures 11 mm, previously 6 mm (series 2, image 27). Indeterminate lesion of the lateral inferior RIGHT kidney with an enhancing septum measures 2.3 cm,  previously 2 cm (series 2, image 36). Additional indeterminate lesion of the inferior pole with thin enhancing septation measures 19 mm, previously 15 mm (series 2, image 41). Additional subcentimeter hypodense lesions are too small to accurately characterize. No hydronephrosis. Bladder is decompressed. Stomach/Bowel: Small hiatal hernia. Status post RIGHT colonic surgery. Colon is fluid filled with mild subjective diffuse bowel wall thickening. Scattered diverticulosis. No evidence of bowel obstruction. Vascular/Lymphatic: RIGHT external iliac lymph node measures 14 mm in the short axis,  previously 3 mm (series 2, image 66). LEFT internal iliac lymph node measures 7 mm, not previously visualized (series 2, image 66). 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 This is unchanged. Reproductive: Brachytherapy seeds. Other: No free fluid. Musculoskeletal: Degenerative changes of the lumbar spine. Transitional anatomy with a LEFT greater than RIGHT-sided assimilation joint at L5-S1 and rib lets at T12. Advanced degenerative changes of the RIGHT hip. Bone island of the RIGHT pubic symphysis. New tiny focus of sclerosis in the LEFT iliac bone, nonspecific (series 2, image 55). New sclerotic lesion of the RIGHT aspect of the L1 vertebral body (series 2, image 29 IMPRESSION: 1. Colon is fluid-filled with mild subjective diffuse bowel wall thickening carotid findings could reflect a colitis in the appropriate clinical setting. 2. Revisualization of several indeterminate renal lesions, some of which have mildly increased in size compared to the prior study. Findings are concerning for renal cell carcinoma. Further evaluation with renal protocol CT or MRI is recommended. 3. New pelvic lymphadenopathy as well as new sclerotic lesions in the RIGHT aspect of the L1  vertebral body and the LEFT iliac bone. Given history of prostate cancer, findings are concerning for nodal and osseous metastatic disease. Recommend correlation with PSA. 4. Increased LEFT basilar dependent ground-glass opacities may represent atelectasis or pneumonia. Aortic Atherosclerosis (ICD10-I70.0). Electronically Signed   By: Valentino Saxon Long   On: 09/15/2019 11:26    EKG: I independently viewed the EKG done and my findings are as followed: EKG was not done in the ED.  Assessment/Plan Present on Admission: . Acute colitis . Dyslipidemia . Essential hypertension . GERD . LOW BACK PAIN . Hypokalemia  Principal Problem:   Acute colitis Active Problems:   Dyslipidemia   Essential hypertension   GERD   LOW BACK PAIN   Hypokalemia   AKI (acute kidney injury) (HCC)   Abdominal pain   Nausea   Hypoalbuminemia   Renal lesion   Pelvic lymphadenopathy   History of atrial fibrillation  Abdominal pain and nausea in the setting of acute colitis Patient will be admitted to MedSurg unit Continue IV NS at 38 mLs/Hr Patient was empirically started on IV Flagyl and Cipro, we will continue IV Zosyn 3.375 q.8h at this time with plan to de-escalate based on blood culture and stool GI panel by PCR (patient has not had any bowel movement since arrival to the ED). Continue IV morphine 2 mg q.4h p.r.n. for moderate to severe pain Continue IV Zofran p.r.n. Continue clear liquid diet with plan to advanced diet as tolerated Blood culture x2 and stool GI panel pending  Acute kidney injury Creatinine on admission=1.43 with subsequent creatinine at 1.50 (baseline creatinine at 1.26-1.27)    Renally adjust medications, avoid nephrotoxic agents/dehydration/hypotension  Hypokalemia K+ 3.4>3.2; this will be replenished  Renal lesion CT abdomen and pelvis with contrast showed revisualization of several indeterminate renal lesions, some of which have mildly increased in size compared to prior  studies with findings concerning for renal cell carcinoma Further evaluation with renal protocol CT or MRI is recommended   Pelvic lymphadenopathy CT abdomen and pelvis showed new pelvic lymphadenopathy and new sclerotic lesions in the right aspect of the L1 vertebral body and left iliac bone with concern for nodal and osseous metastatic disease due to patient's history of prostate cancer. PSA level will be checked per radiologist  recommendation  Essential hypertension (controlled) Continue amlodipine and Coreg Lasix will be temporarily held due to acute kidney injury  Dyslipidemia Continue Lipitor  GERD Continue Protonix  History of atrial fibrillation Continue Xarelto and Coreg per home regimen  Hypoalbuminemia possible secondary to mild protein calorie malnutrition Albumin 3.2, oral supplement will be provided  DVT prophylaxis: Xarelto  Code Status: Full code  Family Communication: Son at bedside (all questions answered to satisfaction)  Disposition Plan:  Patient is from:                        home Anticipated DC to:                   SNF or family members home Anticipated DC date:               2-3 days Anticipated DC barriers:          Unstable to discharge at this time due to abdominal pain secondary to colitis requiring antibiotics at this time and acute kidney injury requiring IV hydration  Consults called: None  Admission status: Observation    Bernadette Hoit Long [facility]  If 7PM-7AM, please contact night-coverage [email or web address]  09/16/2019, 6:03 AM

## 2019-09-17 DIAGNOSIS — K529 Noninfective gastroenteritis and colitis, unspecified: Secondary | ICD-10-CM

## 2019-09-17 LAB — COMPREHENSIVE METABOLIC PANEL
ALT: 27 U/L (ref 0–44)
AST: 33 U/L (ref 15–41)
Albumin: 3 g/dL — ABNORMAL LOW (ref 3.5–5.0)
Alkaline Phosphatase: 57 U/L (ref 38–126)
Anion gap: 9 (ref 5–15)
BUN: 18 mg/dL (ref 8–23)
CO2: 19 mmol/L — ABNORMAL LOW (ref 22–32)
Calcium: 8.4 mg/dL — ABNORMAL LOW (ref 8.9–10.3)
Chloride: 109 mmol/L (ref 98–111)
Creatinine, Ser: 1.63 mg/dL — ABNORMAL HIGH (ref 0.61–1.24)
GFR calc Af Amer: 42 mL/min — ABNORMAL LOW (ref >60)
GFR calc non Af Amer: 36 mL/min — ABNORMAL LOW (ref >60)
Glucose, Bld: 87 mg/dL (ref 70–99)
Potassium: 3.8 mmol/L (ref 3.5–5.1)
Sodium: 137 mmol/L (ref 135–145)
Total Bilirubin: 0.8 mg/dL (ref 0.3–1.2)
Total Protein: 6.2 g/dL — ABNORMAL LOW (ref 6.5–8.1)

## 2019-09-17 LAB — GASTROINTESTINAL PANEL BY PCR, STOOL (REPLACES STOOL CULTURE)

## 2019-09-17 LAB — CBC
HCT: 33 % — ABNORMAL LOW (ref 39.0–52.0)
Hemoglobin: 10.7 g/dL — ABNORMAL LOW (ref 13.0–17.0)
MCH: 32.9 pg (ref 26.0–34.0)
MCHC: 32.4 g/dL (ref 30.0–36.0)
MCV: 101.5 fL — ABNORMAL HIGH (ref 80.0–100.0)
Platelets: 128 10*3/uL — ABNORMAL LOW (ref 150–400)
RBC: 3.25 MIL/uL — ABNORMAL LOW (ref 4.22–5.81)
RDW: 13.8 % (ref 11.5–15.5)
WBC: 5.4 10*3/uL (ref 4.0–10.5)
nRBC: 0 % (ref 0.0–0.2)

## 2019-09-17 LAB — MAGNESIUM: Magnesium: 1.7 mg/dL (ref 1.7–2.4)

## 2019-09-17 MED ORDER — TRAMADOL HCL 50 MG PO TABS
50.0000 mg | ORAL_TABLET | Freq: Four times a day (QID) | ORAL | Status: DC | PRN
  Administered 2019-09-17 – 2019-09-18 (×2): 50 mg via ORAL
  Filled 2019-09-17 (×2): qty 1

## 2019-09-17 MED ORDER — AZITHROMYCIN 250 MG PO TABS
500.0000 mg | ORAL_TABLET | Freq: Every day | ORAL | Status: DC
  Administered 2019-09-17 – 2019-09-18 (×2): 500 mg via ORAL
  Filled 2019-09-17 (×2): qty 2

## 2019-09-17 MED ORDER — SODIUM CHLORIDE 0.9 % IV SOLN: INTRAVENOUS | Status: AC

## 2019-09-17 NOTE — Progress Notes (Signed)
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 Patient was seen in the ED yesterday in the morning with complaint of right flank pain and 1-2 new onset nonbloody diarrhea. He endorsed 1 year history of intermittent right flank pain and follows primarily at the [place]. CT abdomen and pelvis with contrast was done and was suspicious for colitis. He was prescribed with Imodium and discharged home. Patient returnedin the evening complaining of persistent nausea and inability to eat since being discharged from the ED in the morning, he also complained of about 5 episodes of loose stool which did not improve with Imodium. He complained of lower abdominal pain that was rated as 8/10 on pain scale, so he returned to the ED for further evaluation. He denies fever, chills, chest pain, shortness of breath.  -Patient was admitted with abdominal pain and nausea in the setting of acute colitis and has been started on Zosyn after 1 dose of IV Flagyl and ciprofloxacin in ED. he appears to be slowly improving.  He did have CT imaging performed for renal masses yesterday with results still pending.  Continue IV fluid hydration.  Assessment & Plan:   Principal Problem:   Acute colitis Active Problems:   Dyslipidemia   Essential hypertension   GERD   LOW BACK PAIN   Hypokalemia   AKI (acute kidney injury) (HCC)   Abdominal pain   Nausea   Hypoalbuminemia   Renal lesion   Pelvic lymphadenopathy   History of atrial fibrillation   Colitis   Abdominal pain and nausea in the setting of acute colitis-ongoing with diarrhea Continue IV NS at 100 mLs/Hr Patient was empirically started  on IV Flagyl and Cipro, we will continue IV Zosyn 3.375 q.8h at this time with plan to de-escalate based on blood culture and stool GI panel by PCR (patient has not had any bowel movement since arrival to the ED). Continue IV morphine 2 mg q.4h p.r.n. for moderate to severe pain Continue IV Zofran p.r.n. Continue clear liquid diet with plan to advanced diet as tolerated Blood culture x2 and stool GI panel pending  Acute kidney injury Creatinine on admission=1.43 with subsequent creatinine at 1.63 (baseline creatinine at 1.26-1.27)    Renally adjust medications, avoid nephrotoxic agents/dehydration/hypotension -Continue IV fluid hydration  Hypokalemia Replete and continue to follow  Renal lesion CT renal protocol performed 7/31 with results pending  Pelvic lymphadenopathy CT abdomen and pelvis showed new pelvic lymphadenopathy and new sclerotic lesions in the right aspect of the L1 vertebral body and left iliac bone with concern for nodal and osseous metastatic disease due to patient's history of prostate cancer. PSA level noted to be elevated at 4.51 -He will need further follow-up at the [facility] hypertension (controlled) Continue amlodipine and Coreg Lasix will be temporarily held due to acute kidney injury  Dyslipidemia Continue Lipitor  GERD Continue Protonix  History of atrial fibrillation Continue Xarelto and Coreg per home regimen  Hypoalbuminemia possible secondary to mild protein calorie malnutrition Albumin 3.0, oral supplement will be provided   DVT prophylaxis: Xarelto Code Status: Full code Family Communication: Discussed with son at bedside 8/1 Disposition Plan:   Status is: Inpatient  Remains inpatient appropriate because:IV treatments appropriate due to intensity  of illness or inability to take PO and Inpatient level of care appropriate due to severity of illness   Dispo: The patient is from: Home              Anticipated d/c is to:  Home              Anticipated d/c date is: 1 day              Patient currently is not medically stable to d/c.  He continues to require IV fluid and IV antibiotics and is still quite symptomatic.  Consultants:   None  Procedures:   See below  Antimicrobials:  Anti-infectives (From admission, onward)   Start     Dose/Rate Route Frequency Ordered Stop   09/16/19 0600  piperacillin-tazobactam (ZOSYN) IVPB 3.375 g  Status:  Discontinued        3.375 g 100 mL/hr over 30 Minutes Intravenous Every 8 hours 09/16/19 0524 09/16/19 0527   09/16/19 0600  piperacillin-tazobactam (ZOSYN) IVPB 3.375 g     Discontinue     3.375 g 12.5 mL/hr over 240 Minutes Intravenous Every 8 hours 09/16/19 0528     09/15/19 2345  ciprofloxacin (CIPRO) IVPB 400 mg  Status:  Discontinued        400 mg 200 mL/hr over 60 Minutes Intravenous Every 12 hours 09/15/19 2341 09/16/19 0528   09/15/19 2345  metroNIDAZOLE (FLAGYL) tablet 500 mg        500 mg Oral  Once 09/15/19 2341 09/16/19 0050   09/15/19 0000  ciprofloxacin (CIPRO) 250 MG tablet     Discontinue     250 mg Oral 2 times daily 09/15/19 2344 09/22/19 2359   09/15/19 0000  metroNIDAZOLE (FLAGYL) 500 MG tablet     Discontinue     500 mg Oral 2 times daily 09/15/19 2344         Subjective: Patient seen and evaluated today with some ongoing abdominal pain and poor appetite.  He continues to have loose, liquidy bowel movements.  Denies any nausea or vomiting.  Objective: Vitals:   09/16/19 0902 09/16/19 1341 09/16/19 2041 09/17/19 0456  BP: (!) 115/56 (!) 114/60 (!) 115/56 (!) 127/57  Pulse: 70 70 70 66  Resp:  16 15 16   Temp:  99.5 F (37.5 C) 99.3 F (37.4 C) 98.5 F (36.9 C)  TempSrc:  Oral  Oral  SpO2:  96% 93% 97%  Weight:      Height:        Intake/Output Summary (Last 24 hours) at 09/17/2019 1316 Last data filed at 09/17/2019 0400 Gross per 24 hour  Intake 927.84 ml  Output --  Net 927.84 ml   Filed Weights   09/15/19 2206  Weight:  89.8 kg    Examination:  General exam: Appears calm and comfortable  Respiratory system: Clear to auscultation. Respiratory effort normal. Cardiovascular system: S1 & S2 heard, RRR.  Gastrointestinal system: Abdomen is soft Central nervous system: Alert and awake Extremities: No edema Skin: No rashes, lesions or ulcers Psychiatry: Flat affect    Data Reviewed: I have personally reviewed following labs and imaging studies  CBC: Recent Labs  Lab 09/15/19 0907 09/15/19 2240 09/16/19 0853 09/17/19 0559  WBC 5.8 7.1 6.4 5.4  NEUTROABS 4.5 5.6  --   --   HGB 12.4* 11.3* 10.7* 10.7*  HCT 37.9* 33.7* 32.9* 33.0*  MCV 102.2* 98.5 101.9* 101.5*  PLT 159 154 128* 235*   Basic Metabolic Panel: Recent Labs  Lab 09/15/19 0907 09/15/19 2240 09/16/19 0853 09/17/19 0559  NA 136 134* 133* 137  K 3.4* 3.2* 3.2* 3.8  CL 104 103 104 109  CO2 24 20* 20* 19*  GLUCOSE 116* 119* 90 87  BUN 19 18 16 18   CREATININE 1.43* 1.50* 1.49* 1.63*  CALCIUM 8.9 8.5* 8.0* 8.4*  MG  --   --  1.3* 1.7  PHOS  --   --  2.8  --    GFR: Estimated Creatinine Clearance: 33.4 mL/min (A) (by C-G formula based on SCr of 1.63 mg/dL (H)). Liver Function Tests: Recent Labs  Lab 09/15/19 0907 09/15/19 2240 09/16/19 0853 09/17/19 0559  AST 18 18 47* 33  ALT 13 13 37 27  ALKPHOS 58 52 69 57  BILITOT 1.1 0.9 0.8 0.8  PROT 7.2 6.7 6.1* 6.2*  ALBUMIN 3.6 3.2* 3.0* 3.0*   Recent Labs  Lab 09/15/19 0908 09/15/19 2240  LIPASE 24 20   No results for input(s): AMMONIA in the last 168 hours. Coagulation Profile: Recent Labs  Lab 09/16/19 0853  INR 1.4*   Cardiac Enzymes: No results for input(s): CKTOTAL, CKMB, CKMBINDEX, TROPONINI in the last 168 hours. BNP (last 3 results) No results for input(s): PROBNP in the last 8760 hours. HbA1C: No results for input(s): HGBA1C in the last 72 hours. CBG: No results for input(s): GLUCAP in the last 168 hours. Lipid Profile: No results for input(s): CHOL,  HDL, LDLCALC, TRIG, CHOLHDL, LDLDIRECT in the last 72 hours. Thyroid Function Tests: No results for input(s): TSH, T4TOTAL, FREET4, T3FREE, THYROIDAB in the last 72 hours. Anemia Panel: No results for input(s): VITAMINB12, FOLATE, FERRITIN, TIBC, IRON, RETICCTPCT in the last 72 hours. Sepsis Labs: No results for input(s): PROCALCITON, LATICACIDVEN in the last 168 hours.  Recent Results (from the past 240 hour(s))  Urine culture     Status: None   Collection Time: 09/15/19 10:54 AM   Specimen: Urine, Random  Result Value Ref Range Status   Specimen Description   Final    URINE, RANDOM Performed at [facility], [address], [place], [place] [address]    Special Requests   Final    NONE Performed at [facility], [address] [place], [place] [address]    Culture   Final    NO GROWTH Performed at [facility], [place] [address], [place], [place] [address]    Report Status 09/16/2019 FINAL  Final  SARS Coronavirus 2 by RT PCR (hospital order, performed in [facility] hospital lab) Nasopharyngeal Nasopharyngeal Swab     Status: None   Collection Time: 09/15/19 10:17 PM   Specimen: Nasopharyngeal Swab  Result Value Ref Range Status   SARS Coronavirus 2 NEGATIVE NEGATIVE Final    Comment: (NOTE) SARS-CoV-2 target nucleic acids are NOT DETECTED.  The SARS-CoV-2 RNA is generally detectable in upper and lower respiratory specimens during the acute phase of infection. The lowest concentration of SARS-CoV-2 viral copies this assay can detect is 250 copies / mL. A negative result does not preclude SARS-CoV-2 infection and should not be used as the sole basis for treatment or other patient management decisions.  A negative result may occur with improper specimen collection / handling, submission of specimen other than nasopharyngeal swab, presence of viral mutation(s) within the areas targeted by this assay, and inadequate number of viral copies (<250 copies / mL). A negative  result must be combined with clinical observations, patient history, and epidemiological information.  Fact Sheet for Patients:  StrictlyIdeas.no  Fact Sheet for Healthcare Providers: BankingDealers.co.za  This test is not yet approved or  cleared by the [place] FDA and has been authorized for detection and/or diagnosis of SARS-CoV-2 by FDA under an Emergency Use Authorization (EUA).  This EUA will remain in effect (meaning this test can be used) for the duration of the COVID-19 declaration under Section 564(b)(1) of the Act, 21 U.S.C. section 360bbb-3(b)(1), unless the authorization is terminated or revoked sooner.  Performed at [facility], [address], [place], [place] [address]   Culture, blood (Routine X 2) w Reflex to ID Panel     Status: None (Preliminary result)   Collection Time: 09/16/19  8:53 AM   Specimen: BLOOD LEFT ARM  Result Value Ref Range Status   Specimen Description BLOOD LEFT ARM BOTTLES DRAWN AEROBIC AND ANAEROBIC  Final   Special Requests   Final    Blood Culture adequate volume Performed at [facility], [address], [place], [place] [address]    Culture PENDING  Incomplete   Report Status PENDING  Incomplete  Culture, blood (Routine X 2) w Reflex to ID Panel     Status: None (Preliminary result)   Collection Time: 09/16/19  8:53 AM   Specimen: BLOOD RIGHT WRIST  Result Value Ref Range Status   Specimen Description   Final    BLOOD RIGHT WRIST BOTTLES DRAWN AEROBIC AND ANAEROBIC   Special Requests   Final    Blood Culture adequate volume Performed at [facility], [address], [place], [place] [address]    Culture PENDING  Incomplete   Report Status PENDING  Incomplete         Radiology Studies: DG Chest Port 1 View  Result Date: 09/16/2019 CLINICAL DATA:  Hypoxemia. EXAM: PORTABLE CHEST 1 VIEW COMPARISON:  09/15/2016 and earlier. FINDINGS: Cardiac silhouette normal in size,  unchanged. LEFT subclavian dual lead transvenous pacemaker unchanged and appears intact. Lungs clear. Bronchovascular markings normal. Pulmonary vascularity normal. No visible pleural effusions. No pneumothorax. IMPRESSION: No acute cardiopulmonary disease. Electronically Signed   By: Evangeline Dakin M.D.   On: 09/16/2019 10:48   CT RENAL ABD W/WO  Result Date: 09/17/2019 CLINICAL DATA:  Renal mass. EXAM: CT ABDOMEN WITHOUT AND WITH CONTRAST TECHNIQUE: Multidetector CT imaging of the abdomen was performed following the standard protocol before and following the bolus administration of intravenous contrast. CONTRAST:  103mL OMNIPAQUE IOHEXOL 350 MG/ML SOLN COMPARISON:  09/15/2019 FINDINGS: Lower chest: Left basilar atelectasis, similar to prior. Hepatobiliary: No suspicious focal abnormality within the liver parenchyma. Gallbladder surgically absent. Mild intra and extrahepatic biliary duct dilatation, similar to the study from 1 day ago. Common bile duct measures 8 mm diameter in the head of pancreas, upper normal for patient age. Pancreas: No focal mass lesion. No dilatation of the main duct. No intraparenchymal cyst. No peripancreatic edema. Spleen: No splenomegaly. No focal mass lesion. Adrenals/Urinary Tract: No adrenal nodule or mass Multiple lesions are identified in the left kidney, the largest of which is a simple cyst however other lesion show enhancement after IV contrast administration. 2.2 cm cortical lesion lateral interpolar right kidney shows diffuse enhancement after IV contrast administration increasing from 14 Hounsfield units on precontrast imaging tip almost 90 Hounsfield units on delayed postcontrast imaging consistent with neoplasm. A second 2.2 cm suspicious enhancing lesion is seen in the lower pole of the right kidney (image 61/6). This lesion shows heterogeneous enhancement after IV contrast administration, also consistent with neoplasm. Additional tiny lesions in the  right kidney are  too small to characterize. Multiple lesions are also noted in the left kidney some of which are cysts but others that show enhancement after IV contrast administration. Dominant enhancing lesion is exophytic in the anterior upper pole measuring 2.2 cm. This increases in attenuation from 12 Hounsfield units on precontrast imaging to 78 Hounsfield units on delayed postcontrast imaging. Imaging features consistent with neoplasm. A second similar enhancing lesion is identified in the posterior interpolar left kidney measuring 14 mm on image 34 of series 6. The third suspicious lesion in the left kidney is in the medial inferior pole measuring 2.3 cm on 54/6 in showing diffuse low level enhancement after IV contrast administration. Additional tiny lesions in the left kidney are too small to characterize. Stomach/Bowel: Stomach is unremarkable. No gastric wall thickening. No evidence of outlet obstruction. Duodenum is normally positioned as is the ligament of Treitz. No small bowel wall thickening. No small bowel or colonic dilatation within the visualized abdomen. Probable right hemicolectomy. Vascular/Lymphatic: There is abdominal aortic atherosclerosis without aneurysm. There is no gastrohepatic or hepatoduodenal ligament lymphadenopathy. No retroperitoneal or mesenteric lymphadenopathy. Other: No intraperitoneal free fluid. Musculoskeletal: Small sclerotic lesion noted in the right L2 pedicle, indeterminate. IMPRESSION: 1. Multiple bilateral enhancing renal lesions, consistent with neoplasm, measuring up to 2.2 cm in the right kidney and 2.3 cm in the left kidney. Some of these show heterogeneous enhancement suggesting clear cell renal cell carcinoma other show relatively diffuse low level homogeneous enhancement as can be seen in the setting of papillary renal cell carcinoma. 2. Small sclerotic lesion in the right L2 pedicle, indeterminate. Attention on follow-up recommended. 3. Mild intra and extrahepatic biliary  duct dilatation, similar to the study from 1 day ago. Correlation with liver function test may prove helpful. 4. Aortic Atherosclerosis (ICD10-I70.0). Electronically Signed   By: Misty Stanley M.D.   On: 09/17/2019 09:22        Scheduled Meds: . (feeding supplement) PROSource Plus  30 mL Oral BID BM  . amLODipine  10 mg Oral q morning - 10a  . atorvastatin  20 mg Oral q1800  . carvedilol  25 mg Oral BID WC  . feeding supplement  1 Container Oral TID BM  . pantoprazole  40 mg Oral Daily  . Rivaroxaban  15 mg Oral Q supper   Continuous Infusions: . sodium chloride    . piperacillin-tazobactam (ZOSYN)  IV 3.375 g (09/17/19 2458)     LOS: 1 day    Time spent: 30 minutes    Valeda Corzine Darleen Crocker, DO [facility]  If 7PM-7AM, please contact night-coverage [email or web address] 09/17/2019, 1:16 PM

## 2019-09-17 NOTE — Progress Notes (Signed)
Lab called with report of campylobacter.  Contacted Dr. Manuella Ghazi and he is ordering new abx.  Education material given to family

## 2019-09-17 NOTE — Progress Notes (Signed)
Izora Gala RN states pt has adequate access at this time.  No need for midline.

## 2019-09-17 NOTE — Progress Notes (Signed)
Has had two loose stools today and had shower with assist of son.  Daughter has also been a bedside.

## 2019-09-18 ENCOUNTER — Encounter (HOSPITAL_COMMUNITY): Payer: Self-pay

## 2019-09-18 LAB — COMPREHENSIVE METABOLIC PANEL
ALT: 23 U/L (ref 0–44)
AST: 34 U/L (ref 15–41)
Albumin: 3 g/dL — ABNORMAL LOW (ref 3.5–5.0)
Alkaline Phosphatase: 50 U/L (ref 38–126)
Anion gap: 6 (ref 5–15)
BUN: 17 mg/dL (ref 8–23)
CO2: 21 mmol/L — ABNORMAL LOW (ref 22–32)
Calcium: 8.3 mg/dL — ABNORMAL LOW (ref 8.9–10.3)
Chloride: 111 mmol/L (ref 98–111)
Creatinine, Ser: 1.62 mg/dL — ABNORMAL HIGH (ref 0.61–1.24)
GFR calc Af Amer: 42 mL/min — ABNORMAL LOW (ref >60)
GFR calc non Af Amer: 37 mL/min — ABNORMAL LOW (ref >60)
Glucose, Bld: 92 mg/dL (ref 70–99)
Potassium: 3.9 mmol/L (ref 3.5–5.1)
Sodium: 138 mmol/L (ref 135–145)
Total Bilirubin: 0.9 mg/dL (ref 0.3–1.2)
Total Protein: 6.5 g/dL (ref 6.5–8.1)

## 2019-09-18 LAB — CBC
HCT: 31.6 % — ABNORMAL LOW (ref 39.0–52.0)
Hemoglobin: 10.1 g/dL — ABNORMAL LOW (ref 13.0–17.0)
MCH: 32.4 pg (ref 26.0–34.0)
MCHC: 32 g/dL (ref 30.0–36.0)
MCV: 101.3 fL — ABNORMAL HIGH (ref 80.0–100.0)
Platelets: 142 10*3/uL — ABNORMAL LOW (ref 150–400)
RBC: 3.12 MIL/uL — ABNORMAL LOW (ref 4.22–5.81)
RDW: 13.7 % (ref 11.5–15.5)
WBC: 8.1 10*3/uL (ref 4.0–10.5)
nRBC: 0 % (ref 0.0–0.2)

## 2019-09-18 LAB — MAGNESIUM: Magnesium: 1.6 mg/dL — ABNORMAL LOW (ref 1.7–2.4)

## 2019-09-18 MED ORDER — TRAMADOL HCL 50 MG PO TABS: 50.0000 mg | ORAL_TABLET | Freq: Four times a day (QID) | ORAL | 0 refills | Status: DC | PRN

## 2019-09-18 MED ORDER — PROSOURCE PLUS PO LIQD: 30.0000 mL | Freq: Two times a day (BID) | ORAL | 3 refills | Status: DC

## 2019-09-18 MED ORDER — GUAIFENESIN-DM 100-10 MG/5ML PO SYRP: 5.0000 mL | ORAL_SOLUTION | ORAL | 0 refills | Status: DC | PRN

## 2019-09-18 MED ORDER — AZITHROMYCIN 500 MG PO TABS: 500.0000 mg | ORAL_TABLET | Freq: Every day | ORAL | 0 refills | Status: AC

## 2019-09-18 NOTE — Progress Notes (Signed)
I placed an introductory phone call to this patient. I introduced myself and explained my role in the patient's care. I briefly explained what the patient can expect during his initial visit with Dr. Delton Coombes. Patient expresses concern that he is not interested in taking any chemotherapy treatment. I encouraged the patient to come to his appointment with an open mind and express concerns to Dr. Delton Coombes to discuss the best treatment options. Patient agreeable to proceed with visit. I will plan to meet with patient during his initial visit with Dr. Delton Coombes.

## 2019-09-18 NOTE — Discharge Summary (Signed)
Physician Discharge Summary  Derrick Long CWC:[id number] DOB: 08-19-1926 DOA: 09/15/2019  PCP: [facility]  Admit date: 09/15/2019  Discharge date: 09/18/2019  Admitted From:Home  Disposition:  Home  Recommendations for Outpatient Follow-up:  1. Follow up with PCP in 1-2 weeks 2. Please obtain BMP/CBC in one week, follow creatinine with mild renal insufficiency noted in creatinine 1.6 on day of discharge with baseline near 1.2-1.5. 3. Continue on azithromycin as prescribed for 4 more days to complete total 7-day antibiotic course of treatment given severity of colitis.  This is for Campylobacter gastroenteritis/colitis. 4. Tramadol prescription given for low back pain 20 tablets with 0 refills 5. Robitussin as needed for cough or congestion 6. Family members do not wish to pursue further evaluation of renal masses that are suggestive of renal cell carcinoma.  [facility] and declined [facility].  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 Patient was seen in the ED yesterday in the morning with complaint of right flank pain and 1-2 new onset nonbloody diarrhea. He endorsed 1 year history of intermittent right flank pain and follows primarily at the [place]. CT abdomen and pelvis with contrast was done and was suspicious for colitis. He was prescribed with Imodium and discharged home. Patient returnedin the evening complaining of persistent nausea and inability to eat since being discharged from the ED in the morning, he also complained of about 5 episodes of loose stool which did not improve with Imodium.  He complained of lower abdominal pain that was rated as 8/10 on pain scale, so he returned to the ED for further evaluation. He denies fever, chills, chest pain, shortness of breath.  -Patient was admitted with abdominal pain and nausea in the setting of acute colitis and has been started on Zosyn after 1 dose of IV Flagyl and ciprofloxacin in ED.  he had shown steady improvement during the course of the hospitalization on IV Zosyn.  His stool cultures did result with Campylobacter and he was adjusted to azithromycin which he was tolerating quite well.  He is tolerating his diet and that his stool frequency has declined considerably to about 2 bowel movements per day.  He is overall stable for discharge.  He was noted to have findings of renal masses on CT scan and Long renal protocol study was performed demonstrating possibility of renal cell carcinoma with consideration of biopsy.  Family members and patient did not want Long pursue any further evaluation or treatment of any potential cancer at this point.  PSA was also noted to be elevated and patient was informed.  He will follow up with the [facility] hospital system for further evaluation and management as necessary.  He plans to continue his antibiotics as prescribed to finish the course and should have ongoing steady improvement.  He is stable for discharge with no other acute events noted throughout this hospitalization.  He is now well hydrated, but creatinine levels remain around 1.6 and are stable.  He has been encouraged to keep his fluid intake up.  He is having good urine output.  He will need repeat BMP in 1 week.  Discharge Diagnoses:  Principal Problem:   Acute colitis Active Problems:  Dyslipidemia   Essential hypertension   GERD   LOW BACK PAIN   Hypokalemia   AKI (acute kidney injury) ([place])   Abdominal pain   Nausea   Hypoalbuminemia   Renal lesion   Pelvic lymphadenopathy   History of atrial fibrillation   Colitis  Principal  discharge diagnosis: Gastroenteritis/colitis secondary to Campylobacter infection.    Discharge Instructions  Discharge Instructions    Diet - low sodium heart healthy   Complete by: As directed    Increase activity slowly   Complete by: As directed      Allergies as of 09/18/2019   No Known Allergies     Medication List    STOP taking these medications   ciprofloxacin 250 MG tablet Commonly known as: CIPRO   furosemide 40 MG tablet Commonly known as: LASIX   lisinopril 40 MG tablet Commonly known as: ZESTRIL   metroNIDAZOLE 500 MG tablet Commonly known as: FLAGYL     TAKE these medications   (feeding supplement) PROSource Plus liquid Take 30 mLs by mouth 2 (two) times daily between meals.   alfuzosin 10 MG 24 hr tablet Commonly known as: UROXATRAL Take 10 mg by mouth at bedtime.   ALPRAZolam 0.5 MG tablet Commonly known as: XANAX Take 1 tablet (0.5 mg total) by mouth at bedtime as needed for sleep.   amLODipine 10 MG tablet Commonly known as: NORVASC Take 10 mg by mouth every morning.   atorvastatin 20 MG tablet Commonly known as: LIPITOR Take 1 tablet (20 mg total) by mouth daily at 6 PM.   azithromycin 500 MG tablet Commonly known as: ZITHROMAX Take 1 tablet (500 mg total) by mouth daily for 4 days.   B-12 1000 MCG Caps Take 1 tablet by mouth every morning.   carvedilol 25 MG tablet Commonly known as: COREG Take 25 mg by mouth 2 (two) times daily with Long meal.   colchicine 0.6 MG tablet Take 1 tablet (0.6 mg total) by mouth 2 (two) times daily as needed (gout).   diclofenac sodium 1 % Gel Commonly known as: VOLTAREN Apply 2 g topically 4 (four) times daily.   ferrous sulfate 325 (65 FE) MG tablet Take 325 mg by mouth 2 (two) times daily with Long meal.   fluticasone 50 MCG/ACT nasal spray Commonly known as: FLONASE Place 2 sprays into both nostrils daily.   guaiFENesin-dextromethorphan 100-10 MG/5ML syrup Commonly known as: ROBITUSSIN DM Take  5 mLs by mouth every 4 (four) hours as needed for cough.   loperamide 2 MG capsule Commonly known as: IMODIUM Take 1 capsule (2 mg total) by mouth 4 (four) times daily as needed for diarrhea or loose stools.   meloxicam 15 MG tablet Commonly known as: Mobic Take 1 tablet (15 mg total) by mouth daily as needed for pain.   methocarbamol 500 MG tablet Commonly known as: ROBAXIN Take 1 tablet (500 mg total) by mouth every 8 (eight) hours as needed for muscle spasms.   omeprazole 20 MG capsule Commonly known as: PRILOSEC Take 20 mg by mouth every morning.   rivaroxaban 20 MG Tabs tablet Commonly known as: XARELTO Take 1 tablet (20 mg total) by mouth daily with supper.   tamsulosin 0.4 MG Caps capsule Commonly known as: FLOMAX Take 0.8 mg by mouth at bedtime.   traMADol 50 MG tablet Commonly known as: ULTRAM Take 1 tablet (50 mg total) by mouth every 6 (six) hours as needed for moderate pain or severe pain.  [place] [facility] Follow up in 1 week(s).   Specialty: General Practice Contact information: [address] [place] [address] [phone]              No Known Allergies  Consultations:  None   Procedures/Studies: CT ABDOMEN PELVIS W CONTRAST  Result Date: 09/15/2019 CLINICAL DATA:  Abdominal pain and mid back pain for Long day EXAM: CT ABDOMEN AND PELVIS WITH CONTRAST TECHNIQUE: Multidetector CT imaging of the abdomen and pelvis was performed using the standard protocol following bolus administration of intravenous contrast. CONTRAST:  53mL OMNIPAQUE IOHEXOL 300 MG/ML  SOLN COMPARISON:  August 09, 2013 FINDINGS: Lower chest: There are increased LEFT basilar dependent ground-glass opacities. Hepatobiliary: Status post cholecystectomy with unchanged central biliary ductal dilation since 2015. Common bile duct measures approximately 11 mm. Pancreas: Unremarkable. No pancreatic ductal dilatation or surrounding inflammatory changes.  Spleen: Normal in size without focal abnormality. Adrenals/Urinary Tract: Adrenal glands are unremarkable. Revisualization of several indeterminate renal lesions. LEFT upper in the anterior renal lesion measures 2.2 x 2.1 by 1.6 cm, previously 1.9 x 1.6 by 1.1 cm (series 2, image 21; series 5, image 53). Indeterminate lesion of the inferior pole of the LEFT kidney measures 2.0 x 1.9 by 1.8 cm, previously 1.4 x 1.4 by 1.1 cm (series 2, image 35; series 5, image 57). Indeterminate lesion of the interpolar medial LEFT kidney measures 11 mm, previously 6 mm (series 2, image 27). Indeterminate lesion of the lateral inferior RIGHT kidney with an enhancing septum measures 2.3 cm, previously 2 cm (series 2, image 36). Additional indeterminate lesion of the inferior pole with thin enhancing septation measures 19 mm, previously 15 mm (series 2, image 41). Additional subcentimeter hypodense lesions are too small to accurately characterize. No hydronephrosis. Bladder is decompressed. Stomach/Bowel: Small hiatal hernia. Status post RIGHT colonic surgery. Colon is fluid filled with mild subjective diffuse bowel wall thickening. Scattered diverticulosis. No evidence of bowel obstruction. Vascular/Lymphatic: RIGHT external iliac lymph node measures 14 mm in the short axis, previously 3 mm (series 2, image 66). LEFT internal iliac lymph node measures 7 mm, not previously visualized (series 2, image 66). 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 This is unchanged. Reproductive: Brachytherapy seeds. Other: No free fluid. Musculoskeletal: Degenerative changes of the lumbar spine. Transitional anatomy with Long LEFT greater than RIGHT-sided assimilation joint at L5-S1 and rib lets at T12. Advanced degenerative changes of the RIGHT hip. Bone island of the RIGHT pubic symphysis. New  tiny focus of sclerosis in the LEFT iliac bone, nonspecific (series 2, image 55). New sclerotic lesion of the RIGHT aspect of the L1 vertebral body (series 2, image 29 IMPRESSION: 1. Colon is fluid-filled with mild subjective diffuse bowel wall thickening carotid findings could reflect Long colitis in the appropriate clinical setting. 2. Revisualization of several indeterminate renal lesions, some of which have mildly increased in size compared to the prior study. Findings are concerning for renal cell carcinoma. Further evaluation with renal protocol CT or MRI is recommended. 3. New pelvic lymphadenopathy as well as new sclerotic lesions in the RIGHT aspect of the L1 vertebral body and the LEFT iliac bone. Given history of prostate cancer, findings are concerning for nodal and osseous metastatic disease. Recommend correlation with PSA. 4. Increased LEFT basilar dependent ground-glass opacities may represent atelectasis or pneumonia. Aortic Atherosclerosis (  ICD10-I70.0). Electronically Signed   By: Valentino Saxon MD   On: 09/15/2019 11:26   DG Chest Port 1 View  Result Date: 09/16/2019 CLINICAL DATA:  Hypoxemia. EXAM: PORTABLE CHEST 1 VIEW COMPARISON:  09/15/2016 and earlier. FINDINGS: Cardiac silhouette normal in size, unchanged. LEFT subclavian dual lead transvenous pacemaker unchanged and appears intact. Lungs clear. Bronchovascular markings normal. Pulmonary vascularity normal. No visible pleural effusions. No pneumothorax. IMPRESSION: No acute cardiopulmonary disease. Electronically Signed   By: Evangeline Dakin M.D.   On: 09/16/2019 10:48   CT RENAL ABD W/WO  Result Date: 09/17/2019 CLINICAL DATA:  Renal mass. EXAM: CT ABDOMEN WITHOUT AND WITH CONTRAST TECHNIQUE: Multidetector CT imaging of the abdomen was performed following the standard protocol before and following the bolus administration of intravenous contrast. CONTRAST:  71mL OMNIPAQUE IOHEXOL 350 MG/ML SOLN COMPARISON:  09/15/2019 FINDINGS:  Lower chest: Left basilar atelectasis, similar to prior. Hepatobiliary: No suspicious focal abnormality within the liver parenchyma. Gallbladder surgically absent. Mild intra and extrahepatic biliary duct dilatation, similar to the study from 1 day ago. Common bile duct measures 8 mm diameter in the head of pancreas, upper normal for patient age. Pancreas: No focal mass lesion. No dilatation of the main duct. No intraparenchymal cyst. No peripancreatic edema. Spleen: No splenomegaly. No focal mass lesion. Adrenals/Urinary Tract: No adrenal nodule or mass Multiple lesions are identified in the left kidney, the largest of which is Long simple cyst however other lesion show enhancement after IV contrast administration. 2.2 cm cortical lesion lateral interpolar right kidney shows diffuse enhancement after IV contrast administration increasing from 14 Hounsfield units on precontrast imaging tip almost 90 Hounsfield units on delayed postcontrast imaging consistent with neoplasm. Long second 2.2 cm suspicious enhancing lesion is seen in the lower pole of the right kidney (image 61/6). This lesion shows heterogeneous enhancement after IV contrast administration, also consistent with neoplasm. Additional tiny lesions in the right kidney are too small to characterize. Multiple lesions are also noted in the left kidney some of which are cysts but others that show enhancement after IV contrast administration. Dominant enhancing lesion is exophytic in the anterior upper pole measuring 2.2 cm. This increases in attenuation from 12 Hounsfield units on precontrast imaging to 78 Hounsfield units on delayed postcontrast imaging. Imaging features consistent with neoplasm. Long second similar enhancing lesion is identified in the posterior interpolar left kidney measuring 14 mm on image 34 of series 6. The third suspicious lesion in the left kidney is in the medial inferior pole measuring 2.3 cm on 54/6 in showing diffuse low level enhancement  after IV contrast administration. Additional tiny lesions in the left kidney are too small to characterize. Stomach/Bowel: Stomach is unremarkable. No gastric wall thickening. No evidence of outlet obstruction. Duodenum is normally positioned as is the ligament of Treitz. No small bowel wall thickening. No small bowel or colonic dilatation within the visualized abdomen. Probable right hemicolectomy. Vascular/Lymphatic: There is abdominal aortic atherosclerosis without aneurysm. There is no gastrohepatic or hepatoduodenal ligament lymphadenopathy. No retroperitoneal or mesenteric lymphadenopathy. Other: No intraperitoneal free fluid. Musculoskeletal: Small sclerotic lesion noted in the right L2 pedicle, indeterminate. IMPRESSION: 1. Multiple bilateral enhancing renal lesions, consistent with neoplasm, measuring up to 2.2 cm in the right kidney and 2.3 cm in the left kidney. Some of these show heterogeneous enhancement suggesting clear cell renal cell carcinoma other show relatively diffuse low level homogeneous enhancement as can be seen in the setting of papillary renal cell carcinoma. 2. Small sclerotic lesion in the  right L2 pedicle, indeterminate. Attention on follow-up recommended. 3. Mild intra and extrahepatic biliary duct dilatation, similar to the study from 1 day ago. Correlation with liver function test may prove helpful. 4. Aortic Atherosclerosis (ICD10-I70.0). Electronically Signed   By: Misty Stanley M.D.   On: 09/17/2019 09:22     Discharge Exam: Vitals:   09/18/19 0534 09/18/19 0820  BP: (!) 133/58 (!) 130/58  Pulse: 66 64  Resp: 18 18  Temp: 99.9 F (37.7 C)   SpO2: 94% 93%   Vitals:   09/17/19 1748 09/17/19 2048 09/18/19 0534 09/18/19 0820  BP: (!) 123/64 (!) 115/61 (!) 133/58 (!) 130/58  Pulse: 69 70 66 64  Resp:  20 18 18   Temp:  98.2 F (36.8 C) 99.9 F (37.7 C)   TempSrc:   Oral   SpO2:  90% 94% 93%  Weight:      Height:        General: Pt is alert, awake, not in  acute distress Cardiovascular: RRR, S1/S2 +, no rubs, no gallops Respiratory: CTA bilaterally, no wheezing, no rhonchi Abdominal: Soft, NT, ND, bowel sounds + Extremities: no edema, no cyanosis    The results of significant diagnostics from this hospitalization (including imaging, microbiology, ancillary and laboratory) are listed below for reference.     Microbiology: Recent Results (from the past 240 hour(s))  Urine culture     Status: None   Collection Time: 09/15/19 10:54 AM   Specimen: Urine, Random  Result Value Ref Range Status   Specimen Description   Final    URINE, RANDOM Performed at [facility], [address] [place], [place] [address]    Special Requests   Final    NONE Performed at [facility], [address] [place], [place] [address]    Culture   Final    NO GROWTH Performed at [facility], [place] [address], [place], [place] [address]    Report Status 09/16/2019 FINAL  Final  SARS Coronavirus 2 by RT PCR (hospital order, performed in [facility] hospital lab) Nasopharyngeal Nasopharyngeal Swab     Status: None   Collection Time: 09/15/19 10:17 PM   Specimen: Nasopharyngeal Swab  Result Value Ref Range Status   SARS Coronavirus 2 NEGATIVE NEGATIVE Final    Comment: (NOTE) SARS-CoV-2 target nucleic acids are NOT DETECTED.  The SARS-CoV-2 RNA is generally detectable in upper and lower respiratory specimens during the acute phase of infection. The lowest concentration of SARS-CoV-2 viral copies this assay can detect is 250 copies / mL. Long negative result does not preclude SARS-CoV-2 infection and should not be used as the sole basis for treatment or other patient management decisions.  Long negative result may occur with improper specimen collection / handling, submission of specimen other than nasopharyngeal swab, presence of viral mutation(s) within the areas targeted by this assay, and inadequate number of viral copies (<250 copies / mL). Long negative  result must be combined with clinical observations, patient history, and epidemiological information.  Fact Sheet for Patients:   StrictlyIdeas.no  Fact Sheet for Healthcare Providers: BankingDealers.co.za  This test is not yet approved or  cleared by the [place] FDA and has been authorized for detection and/or diagnosis of SARS-CoV-2 by FDA under an Emergency Use Authorization (EUA).  This EUA will remain in effect (meaning this test can be used) for the duration of the COVID-19 declaration under Section 564(b)(1) of the Act, 21 U.S.C. section 360bbb-3(b)(1), unless the authorization is terminated or  revoked sooner.  Performed at [facility], [address], [place], [place] [address]   Culture, blood (Routine X 2) w Reflex to ID Panel     Status: None (Preliminary result)   Collection Time: 09/16/19  8:53 AM   Specimen: BLOOD LEFT ARM  Result Value Ref Range Status   Specimen Description BLOOD LEFT ARM BOTTLES DRAWN AEROBIC AND ANAEROBIC  Final   Special Requests   Final    Blood Culture adequate volume Performed at [facility], [address], [place], [place] [address]    Culture PENDING  Incomplete   Report Status PENDING  Incomplete  Culture, blood (Routine X 2) w Reflex to ID Panel     Status: None (Preliminary result)   Collection Time: 09/16/19  8:53 AM   Specimen: BLOOD RIGHT WRIST  Result Value Ref Range Status   Specimen Description   Final    BLOOD RIGHT WRIST BOTTLES DRAWN AEROBIC AND ANAEROBIC   Special Requests   Final    Blood Culture adequate volume Performed at [facility], [address] [place], [place] [address]    Culture PENDING  Incomplete   Report Status PENDING  Incomplete  Gastrointestinal Panel by PCR , Stool     Status: Abnormal   Collection Time: 09/16/19  3:39 PM   Specimen: Stool  Result Value Ref Range Status   Campylobacter species DETECTED (Long) NOT DETECTED Final    Comment: RESULT  CALLED TO, READ BACK BY AND VERIFIED WITH: NANCY JACKSON ON 09/17/2019 AT 1739 TIK    Plesimonas shigelloides NOT DETECTED NOT DETECTED Final   Salmonella species NOT DETECTED NOT DETECTED Final   Yersinia enterocolitica NOT DETECTED NOT DETECTED Final   Vibrio species NOT DETECTED NOT DETECTED Final   Vibrio cholerae NOT DETECTED NOT DETECTED Final   Enteroaggregative E coli (EAEC) NOT DETECTED NOT DETECTED Final   Enteropathogenic E coli (EPEC) NOT DETECTED NOT DETECTED Final   Enterotoxigenic E coli (ETEC) NOT DETECTED NOT DETECTED Final   Shiga like toxin producing E coli (STEC) NOT DETECTED NOT DETECTED Final   Shigella/Enteroinvasive E coli (EIEC) NOT DETECTED NOT DETECTED Final   Cryptosporidium NOT DETECTED NOT DETECTED Final   Cyclospora cayetanensis NOT DETECTED NOT DETECTED Final   Entamoeba histolytica NOT DETECTED NOT DETECTED Final   Giardia lamblia NOT DETECTED NOT DETECTED Final   Adenovirus F40/41 NOT DETECTED NOT DETECTED Final   Astrovirus NOT DETECTED NOT DETECTED Final   Norovirus GI/GII NOT DETECTED NOT DETECTED Final   Rotavirus Long NOT DETECTED NOT DETECTED Final   Sapovirus (I, II, IV, and V) NOT DETECTED NOT DETECTED Final    Comment: Performed at [facility], [place] [place], [place] [address]     Labs: BNP (last 3 results) No results for input(s): BNP in the last 8760 hours. Basic Metabolic Panel: Recent Labs  Lab 09/15/19 0907 09/15/19 2240 09/16/19 0853 09/17/19 0559 09/18/19 0634  NA 136 134* 133* 137 138  K 3.4* 3.2* 3.2* 3.8 3.9  CL 104 103 104 109 111  CO2 24 20* 20* 19* 21*  GLUCOSE 116* 119* 90 87 92  BUN 19 18 16 18 17   CREATININE 1.43* 1.50* 1.49* 1.63* 1.62*  CALCIUM 8.9 8.5* 8.0* 8.4* 8.3*  MG  --   --  1.3* 1.7 1.6*  PHOS  --   --  2.8  --   --    Liver Function Tests: Recent Labs  Lab 09/15/19 0907 09/15/19 2240 09/16/19 2620  09/17/19 0559 09/18/19 0634  AST 18 18 47* 33 34  ALT 13 13 37 27 23  ALKPHOS  58 52 69 57 50  BILITOT 1.1 0.9 0.8 0.8 0.9  PROT 7.2 6.7 6.1* 6.2* 6.5  ALBUMIN 3.6 3.2* 3.0* 3.0* 3.0*   Recent Labs  Lab 09/15/19 0908 09/15/19 2240  LIPASE 24 20   No results for input(s): AMMONIA in the last 168 hours. CBC: Recent Labs  Lab 09/15/19 0907 09/15/19 2240 09/16/19 0853 09/17/19 0559 09/18/19 0634  WBC 5.8 7.1 6.4 5.4 8.1  NEUTROABS 4.5 5.6  --   --   --   HGB 12.4* 11.3* 10.7* 10.7* 10.1*  HCT 37.9* 33.7* 32.9* 33.0* 31.6*  MCV 102.2* 98.5 101.9* 101.5* 101.3*  PLT 159 154 128* 128* 142*   Cardiac Enzymes: No results for input(s): CKTOTAL, CKMB, CKMBINDEX, TROPONINI in the last 168 hours. BNP: Invalid input(s): POCBNP CBG: No results for input(s): GLUCAP in the last 168 hours. D-Dimer No results for input(s): DDIMER in the last 72 hours. Hgb A1c No results for input(s): HGBA1C in the last 72 hours. Lipid Profile No results for input(s): CHOL, HDL, LDLCALC, TRIG, CHOLHDL, LDLDIRECT in the last 72 hours. Thyroid function studies No results for input(s): TSH, T4TOTAL, T3FREE, THYROIDAB in the last 72 hours.  Invalid input(s): FREET3 Anemia work up No results for input(s): VITAMINB12, FOLATE, FERRITIN, TIBC, IRON, RETICCTPCT in the last 72 hours. Urinalysis    Component Value Date/Time   COLORURINE YELLOW 09/15/2019 1054   APPEARANCEUR CLEAR 09/15/2019 1054   LABSPEC 1.017 09/15/2019 1054   PHURINE 5.0 09/15/2019 1054   GLUCOSEU NEGATIVE 09/15/2019 1054   HGBUR SMALL ([place]) 09/15/2019 1054   HGBUR negative 06/24/2007 0833   BILIRUBINUR NEGATIVE 09/15/2019 1054   KETONESUR NEGATIVE 09/15/2019 1054   PROTEINUR NEGATIVE 09/15/2019 1054   UROBILINOGEN 1.0 07/29/2013 2032   NITRITE NEGATIVE 09/15/2019 1054   LEUKOCYTESUR NEGATIVE 09/15/2019 1054   Sepsis Labs Invalid input(s): PROCALCITONIN,  WBC,  LACTICIDVEN Microbiology Recent Results (from the past 240 hour(s))  Urine culture     Status: None   Collection Time: 09/15/19 10:54 AM    Specimen: Urine, Random  Result Value Ref Range Status   Specimen Description   Final    URINE, RANDOM Performed at [facility], [address], [place], [place] [address]    Special Requests   Final    NONE Performed at [facility], [address] [place], [place] [address]    Culture   Final    NO GROWTH Performed at [facility], [place] [address], [place], [place] [address]    Report Status 09/16/2019 FINAL  Final  SARS Coronavirus 2 by RT PCR (hospital order, performed in [facility] hospital lab) Nasopharyngeal Nasopharyngeal Swab     Status: None   Collection Time: 09/15/19 10:17 PM   Specimen: Nasopharyngeal Swab  Result Value Ref Range Status   SARS Coronavirus 2 NEGATIVE NEGATIVE Final    Comment: (NOTE) SARS-CoV-2 target nucleic acids are NOT DETECTED.  The SARS-CoV-2 RNA is generally detectable in upper and lower respiratory specimens during the acute phase of infection. The lowest concentration of SARS-CoV-2 viral copies this assay can detect is 250 copies / mL. Long negative result does not preclude SARS-CoV-2 infection and should not be used as the sole basis for treatment or other patient management decisions.  Long negative result may occur with improper specimen collection / handling, submission of specimen other than nasopharyngeal swab, presence  of viral mutation(s) within the areas targeted by this assay, and inadequate number of viral copies (<250 copies / mL). Long negative result must be combined with clinical observations, patient history, and epidemiological information.  Fact Sheet for Patients:   StrictlyIdeas.no  Fact Sheet for Healthcare Providers: BankingDealers.co.za  This test is not yet approved or  cleared by the [place] FDA and has been authorized for detection and/or diagnosis of SARS-CoV-2 by FDA under an Emergency Use Authorization (EUA).  This EUA will remain in effect (meaning this test  can be used) for the duration of the COVID-19 declaration under Section 564(b)(1) of the Act, 21 U.S.C. section 360bbb-3(b)(1), unless the authorization is terminated or revoked sooner.  Performed at [facility], [address], [place], [place] [address]   Culture, blood (Routine X 2) w Reflex to ID Panel     Status: None (Preliminary result)   Collection Time: 09/16/19  8:53 AM   Specimen: BLOOD LEFT ARM  Result Value Ref Range Status   Specimen Description BLOOD LEFT ARM BOTTLES DRAWN AEROBIC AND ANAEROBIC  Final   Special Requests   Final    Blood Culture adequate volume Performed at [facility], [address], [place], [place] [address]    Culture PENDING  Incomplete   Report Status PENDING  Incomplete  Culture, blood (Routine X 2) w Reflex to ID Panel     Status: None (Preliminary result)   Collection Time: 09/16/19  8:53 AM   Specimen: BLOOD RIGHT WRIST  Result Value Ref Range Status   Specimen Description   Final    BLOOD RIGHT WRIST BOTTLES DRAWN AEROBIC AND ANAEROBIC   Special Requests   Final    Blood Culture adequate volume Performed at [facility], [address], [place], [place] [address]    Culture PENDING  Incomplete   Report Status PENDING  Incomplete  Gastrointestinal Panel by PCR , Stool     Status: Abnormal   Collection Time: 09/16/19  3:39 PM   Specimen: Stool  Result Value Ref Range Status   Campylobacter species DETECTED (Long) NOT DETECTED Final    Comment: RESULT CALLED TO, READ BACK BY AND VERIFIED WITH: NANCY JACKSON ON 09/17/2019 AT 1739 TIK    Plesimonas shigelloides NOT DETECTED NOT DETECTED Final   Salmonella species NOT DETECTED NOT DETECTED Final   Yersinia enterocolitica NOT DETECTED NOT DETECTED Final   Vibrio species NOT DETECTED NOT DETECTED Final   Vibrio cholerae NOT DETECTED NOT DETECTED Final   Enteroaggregative E coli (EAEC) NOT DETECTED NOT DETECTED Final   Enteropathogenic E coli (EPEC) NOT DETECTED NOT DETECTED Final    Enterotoxigenic E coli (ETEC) NOT DETECTED NOT DETECTED Final   Shiga like toxin producing E coli (STEC) NOT DETECTED NOT DETECTED Final   Shigella/Enteroinvasive E coli (EIEC) NOT DETECTED NOT DETECTED Final   Cryptosporidium NOT DETECTED NOT DETECTED Final   Cyclospora cayetanensis NOT DETECTED NOT DETECTED Final   Entamoeba histolytica NOT DETECTED NOT DETECTED Final   Giardia lamblia NOT DETECTED NOT DETECTED Final   Adenovirus F40/41 NOT DETECTED NOT DETECTED Final   Astrovirus NOT DETECTED NOT DETECTED Final   Norovirus GI/GII NOT DETECTED NOT DETECTED Final   Rotavirus Long NOT DETECTED NOT DETECTED Final   Sapovirus (I, II, IV, and V) NOT DETECTED NOT DETECTED Final    Comment: Performed at [facility], [address] [place], [place] [address]     Time coordinating discharge: 35 minutes  SIGNED:   Shakerra Red  Darleen Crocker, DO [facility] 09/18/2019, 10:25 AM  If 7PM-7AM, please contact night-coverage [email or web address]

## 2019-09-18 NOTE — Progress Notes (Signed)
Pt discharged to POV via wheelchair. Daughter has all of pt's possessions with her.

## 2019-09-19 ENCOUNTER — Telehealth (HOSPITAL_COMMUNITY): Payer: Self-pay | Admitting: *Deleted

## 2019-09-19 NOTE — Telephone Encounter (Signed)
I called pt to do new pt questions for appoinment tomorrow. I spoke with pt's daughter and she asked what the appointment was for. I told her the doctor was going to go over treatment plans with the patient and family. The daughter stated her sister was supposed to call and cancel the appointment for tomorrow. Sister stated she would call to remind her sister to call and cancel tomorrow's appointment.

## 2019-09-20 ENCOUNTER — Telehealth: Payer: Self-pay

## 2019-09-20 ENCOUNTER — Ambulatory Visit (HOSPITAL_COMMUNITY): Payer: No Typology Code available for payment source | Admitting: Hematology

## 2019-09-20 NOTE — Telephone Encounter (Signed)
Called and offered an appt. Spoke with male. She said they did not want appt because they are going to use the [place] instead.

## 2019-09-21 LAB — CULTURE, BLOOD (ROUTINE X 2)
Culture: NO GROWTH
Culture: NO GROWTH
Special Requests: ADEQUATE
Special Requests: ADEQUATE

## 2019-10-24 ENCOUNTER — Ambulatory Visit: Payer: No Typology Code available for payment source | Admitting: Urology

## 2020-05-03 ENCOUNTER — Other Ambulatory Visit: Payer: Self-pay

## 2020-05-03 ENCOUNTER — Emergency Department (HOSPITAL_COMMUNITY): Payer: No Typology Code available for payment source

## 2020-05-03 ENCOUNTER — Encounter (HOSPITAL_COMMUNITY): Payer: Self-pay

## 2020-05-03 ENCOUNTER — Emergency Department (HOSPITAL_COMMUNITY)
Admission: EM | Admit: 2020-05-03 | Discharge: 2020-05-03 | Disposition: A | Discharge status: Home / Self Care | Payer: No Typology Code available for payment source | Attending: Emergency Medicine | Admitting: Emergency Medicine

## 2020-05-03 DIAGNOSIS — R109 Unspecified abdominal pain: Secondary | ICD-10-CM | POA: Diagnosis present

## 2020-05-03 DIAGNOSIS — Z8546 Personal history of malignant neoplasm of prostate: Secondary | ICD-10-CM | POA: Insufficient documentation

## 2020-05-03 DIAGNOSIS — I10 Essential (primary) hypertension: Secondary | ICD-10-CM | POA: Diagnosis not present

## 2020-05-03 DIAGNOSIS — M549 Dorsalgia, unspecified: Secondary | ICD-10-CM | POA: Diagnosis not present

## 2020-05-03 DIAGNOSIS — Z7901 Long term (current) use of anticoagulants: Secondary | ICD-10-CM | POA: Diagnosis not present

## 2020-05-03 DIAGNOSIS — Z950 Presence of cardiac pacemaker: Secondary | ICD-10-CM | POA: Insufficient documentation

## 2020-05-03 DIAGNOSIS — Z87891 Personal history of nicotine dependence: Secondary | ICD-10-CM | POA: Diagnosis not present

## 2020-05-03 DIAGNOSIS — Z85038 Personal history of other malignant neoplasm of large intestine: Secondary | ICD-10-CM | POA: Diagnosis not present

## 2020-05-03 DIAGNOSIS — Z79899 Other long term (current) drug therapy: Secondary | ICD-10-CM | POA: Insufficient documentation

## 2020-05-03 LAB — CBC WITH DIFFERENTIAL/PLATELET
Abs Immature Granulocytes: 0.01 10*3/uL (ref 0.00–0.07)
Basophils Absolute: 0 10*3/uL (ref 0.0–0.1)
Basophils Relative: 0 %
Eosinophils Absolute: 0.1 10*3/uL (ref 0.0–0.5)
Eosinophils Relative: 2 %
HCT: 36.8 % — ABNORMAL LOW (ref 39.0–52.0)
Hemoglobin: 12.3 g/dL — ABNORMAL LOW (ref 13.0–17.0)
Immature Granulocytes: 0 %
Lymphocytes Relative: 36 %
Lymphs Abs: 1.4 10*3/uL (ref 0.7–4.0)
MCH: 32.9 pg (ref 26.0–34.0)
MCHC: 33.4 g/dL (ref 30.0–36.0)
MCV: 98.4 fL (ref 80.0–100.0)
Monocytes Absolute: 0.4 10*3/uL (ref 0.1–1.0)
Monocytes Relative: 11 %
Neutro Abs: 1.9 10*3/uL (ref 1.7–7.7)
Neutrophils Relative %: 51 %
Platelets: 183 10*3/uL (ref 150–400)
RBC: 3.74 MIL/uL — ABNORMAL LOW (ref 4.22–5.81)
RDW: 12.5 % (ref 11.5–15.5)
WBC: 3.9 10*3/uL — ABNORMAL LOW (ref 4.0–10.5)
nRBC: 0 % (ref 0.0–0.2)

## 2020-05-03 LAB — URINALYSIS, ROUTINE W REFLEX MICROSCOPIC
Bilirubin Urine: NEGATIVE
Glucose, UA: NEGATIVE mg/dL
Hgb urine dipstick: NEGATIVE
Ketones, ur: NEGATIVE mg/dL
Leukocytes,Ua: NEGATIVE
Nitrite: NEGATIVE
Protein, ur: NEGATIVE mg/dL
Specific Gravity, Urine: 1.013 (ref 1.005–1.030)
pH: 5 (ref 5.0–8.0)

## 2020-05-03 LAB — COMPREHENSIVE METABOLIC PANEL
ALT: 12 U/L (ref 0–44)
AST: 19 U/L (ref 15–41)
Albumin: 3.4 g/dL — ABNORMAL LOW (ref 3.5–5.0)
Alkaline Phosphatase: 72 U/L (ref 38–126)
Anion gap: 8 (ref 5–15)
BUN: 26 mg/dL — ABNORMAL HIGH (ref 8–23)
CO2: 25 mmol/L (ref 22–32)
Calcium: 8.9 mg/dL (ref 8.9–10.3)
Chloride: 103 mmol/L (ref 98–111)
Creatinine, Ser: 1.39 mg/dL — ABNORMAL HIGH (ref 0.61–1.24)
GFR, Estimated: 48 mL/min — ABNORMAL LOW (ref >60)
Glucose, Bld: 137 mg/dL — ABNORMAL HIGH (ref 70–99)
Potassium: 3.5 mmol/L (ref 3.5–5.1)
Sodium: 136 mmol/L (ref 135–145)
Total Bilirubin: 0.8 mg/dL (ref 0.3–1.2)
Total Protein: 7.2 g/dL (ref 6.5–8.1)

## 2020-05-03 MED ORDER — HYDROCODONE-ACETAMINOPHEN 5-325 MG PO TABS: 1.0000 | ORAL_TABLET | Freq: Four times a day (QID) | ORAL | 0 refills | Status: DC | PRN

## 2020-05-03 NOTE — Discharge Instructions (Signed)
Call Dr. Tomie China office Tuesday if they do not get in touch with you sooner.  Follow-up with Dr. Delton Coombes

## 2020-05-03 NOTE — ED Provider Notes (Signed)
[facility] EMERGENCY DEPARTMENT Provider Note   CSN: [id number] Arrival date & time: 05/03/20  1517     History Chief Complaint  Patient presents with  . Flank Pain    Derrick Long is a 87 y.o. male.  Patient with back pain.  Patient has had back pain for some time but seems to be getting worse.  The history is provided by the patient and medical records.  Flank Pain This is a recurrent problem. The current episode started more than 2 days ago. The problem occurs constantly. The problem has not changed since onset.Pertinent negatives include no chest pain, no abdominal pain and no headaches. The symptoms are aggravated by walking. He has tried nothing for the symptoms. The treatment provided no relief.       Past Medical History:  Diagnosis Date  . Acid reflux   . Anemia   . Ex-cigarette smoker   . Gout   . Hyperlipemia   . Hypertension   . Prostate cancer ([place]) 6.23.2015   SEEDS 20 YEARS AGO  . Prostate disease     Patient Active Problem List   Diagnosis Date Noted  . Acute colitis 09/16/2019  . Abdominal pain 09/16/2019  . Nausea 09/16/2019  . Hypoalbuminemia 09/16/2019  . Renal lesion 09/16/2019  . Pelvic lymphadenopathy 09/16/2019  . History of atrial fibrillation 09/16/2019  . Colitis 09/16/2019  . Paroxysmal atrial fibrillation ([place]) 09/02/2012  . HTN (hypertension) 09/02/2012  . Pacemaker 08/03/2012  . Chest pain 07/31/2012  . Leukocytosis, unspecified 07/31/2012  . AKI (acute kidney injury) ([place]) 07/31/2012  . Gout 07/31/2012  . First degree AV block, PR interval 370 ms 07/19/2012  . Chest pain, atypical 07/19/2012  . Acute renal insufficiency 07/19/2012  . Hypokalemia 07/19/2012  . Syncope 07/19/2012  . Bradycardia 07/19/2012  . LUMBAR STRAIN 09/10/2008  . ANEMIA, IRON DEFICIENCY NOS 08/16/2008  . OSTEOARTHRITIS, MODERATE 09/21/2007  . INSOMNIA 06/24/2007  . Dyslipidemia 04/05/2007  . DENTAL CARIES 10/08/2006  . LATERAL MENISCUS TEAR,  RIGHT 04/19/2006  . MIGRAINE HEADACHE 12/18/2005  . Essential hypertension 12/18/2005  . GERD 12/18/2005  . RENAL CALCULUS 12/18/2005  . BENIGN PROSTATIC HYPERTROPHY, WITH OBSTRUCTION 12/18/2005  . ARTHRITIS 12/18/2005  . LOW BACK PAIN 12/18/2005  . OSTEOPENIA 12/18/2005  . COLON CANCER, HX OF 12/18/2005  . PROSTATE CANCER, HX OF 12/18/2005    Past Surgical History:  Procedure Laterality Date  . LEFT HEART CATHETERIZATION WITH CORONARY ANGIOGRAM N/A 07/21/2012   Procedure: LEFT HEART CATHETERIZATION WITH CORONARY ANGIOGRAM;  Surgeon: Troy Sine, MD;  Location: [facility] CATH LAB;  Service: Cardiovascular;  Laterality: N/A;  . PACEMAKER INSERTION    . PERMANENT PACEMAKER INSERTION N/A 07/22/2012   Procedure: PERMANENT PACEMAKER INSERTION;  Surgeon: Sanda Klein, MD;  Location: [facility] CATH LAB;  Service: Cardiovascular;  Laterality: N/A;  . PROSTATE SURGERY         No family history on file.  Social History   Tobacco Use  . Smoking status: Former Smoker    Quit date: 07/20/2002    Years since quitting: 17.8  . Smokeless tobacco: Never Used  Vaping Use  . Vaping Use: Never used  Substance Use Topics  . Alcohol use: No  . Drug use: No    Home Medications Prior to Admission medications   Medication Sig Start Date End Date Taking? Authorizing Provider  amLODipine (NORVASC) 10 MG tablet Take 10 mg by mouth every morning.    Yes [provider]  atorvastatin (LIPITOR) 20  MG tablet Take 1 tablet (20 mg total) by mouth daily at 6 PM. 07/23/12  Yes Lyda Jester M, PA-C  carvedilol (COREG) 25 MG tablet Take 25 mg by mouth 2 (two) times daily with a meal.   Yes [provider]  Cholecalciferol 25 MCG (1000 UT) tablet Take 1,000 Units by mouth daily.   Yes [provider]  ferrous sulfate 325 (65 FE) MG tablet Take 325 mg by mouth 2 (two) times daily with a meal.   Yes [provider]  furosemide (LASIX) 40 MG tablet Take 40 mg by mouth daily.   Yes  [provider]  HYDROcodone-acetaminophen (NORCO/VICODIN) 5-325 MG tablet Take 1 tablet by mouth every 6 (six) hours as needed for moderate pain. 05/03/20  Yes Milton Ferguson, MD  Menthol-Methyl Salicylate (THERA-GESIC) 0.5-15 % CREA Apply 1 application topically 3 (three) times daily as needed for pain. 02/27/20  Yes [provider]  omeprazole (PRILOSEC) 20 MG capsule Take 20 mg by mouth every morning.    Yes [provider]  potassium chloride SA (KLOR-CON) 20 MEQ tablet Take 20 mEq by mouth daily. 02/28/20  Yes [provider]  rivaroxaban (XARELTO) 20 MG TABS tablet Take 1 tablet (20 mg total) by mouth daily with supper. 10/30/13  Yes Croitoru, Mihai, MD  tamsulosin (FLOMAX) 0.4 MG CAPS capsule Take 0.8 mg by mouth at bedtime.   Yes [provider]  ALPRAZolam (XANAX) 0.5 MG tablet Take 1 tablet (0.5 mg total) by mouth at bedtime as needed for sleep. Patient not taking: Reported on 05/03/2020 06/04/16   Carmin Muskrat, MD  colchicine 0.6 MG tablet Take 1 tablet (0.6 mg total) by mouth 2 (two) times daily as needed (gout). Patient not taking: Reported on 05/03/2020 02/18/17   Margarita Mail, PA-C  diclofenac sodium (VOLTAREN) 1 % GEL Apply 2 g topically 4 (four) times daily. Patient not taking: Reported on 05/03/2020 06/19/17   Ezequiel Essex, MD  fluticasone [facility]) 50 MCG/ACT nasal spray Place 2 sprays into both nostrils daily. Patient not taking: No sig reported 02/15/16   Ward, Cyril Mourning N, DO  guaiFENesin-dextromethorphan (ROBITUSSIN DM) 100-10 MG/5ML syrup Take 5 mLs by mouth every 4 (four) hours as needed for cough. Patient not taking: Reported on 05/03/2020 09/18/19   Heath Lark D, DO  loperamide (IMODIUM) 2 MG capsule Take 1 capsule (2 mg total) by mouth 4 (four) times daily as needed for diarrhea or loose stools. Patient not taking: Reported on 05/03/2020 09/15/19   Long, Wonda Olds, MD  meloxicam (MOBIC) 15 MG tablet Take 1 tablet (15 mg total) by  mouth daily as needed for pain. Patient not taking: Reported on 05/03/2020 08/02/19   Isla Pence, MD  methocarbamol (ROBAXIN) 500 MG tablet Take 1 tablet (500 mg total) by mouth every 8 (eight) hours as needed for muscle spasms. Patient not taking: No sig reported 06/19/17   Rancour, Annie Main, MD  Nutritional Supplements (,FEEDING SUPPLEMENT, PROSOURCE PLUS) liquid Take 30 mLs by mouth 2 (two) times daily between meals. Patient not taking: Reported on 05/03/2020 09/18/19   Heath Lark D, DO  traMADol (ULTRAM) 50 MG tablet Take 1 tablet (50 mg total) by mouth every 6 (six) hours as needed for moderate pain or severe pain. Patient not taking: Reported on 05/03/2020 09/18/19   Heath Lark D, DO    Allergies    Patient has no known allergies.  Review of Systems   Review of Systems  Constitutional: Negative for appetite change and fatigue.  HENT: Negative for congestion, ear discharge and sinus pressure.   Eyes: Negative for discharge.  Respiratory: Negative for cough.   Cardiovascular: Negative for chest pain.  Gastrointestinal: Negative for abdominal pain and diarrhea.  Genitourinary: Positive for flank pain. Negative for frequency and hematuria.  Musculoskeletal: Negative for back pain.  Skin: Negative for rash.  Neurological: Negative for seizures and headaches.  Psychiatric/Behavioral: Negative for hallucinations.    Physical Exam Updated Vital Signs BP (!) 130/25   Pulse 60   Temp 97.8 F (36.6 C) (Oral)   Resp 18   Ht 6\' 1"  (1.854 m)   Wt 91.2 kg   SpO2 97%   BMI 26.52 kg/m   Physical Exam Vitals and nursing note reviewed.  Constitutional:      Appearance: He is well-developed.  HENT:     Head: Normocephalic.     Nose: Nose normal.  Eyes:     General: No scleral icterus.    Conjunctiva/sclera: Conjunctivae normal.  Neck:     Thyroid: No thyromegaly.  Cardiovascular:     Rate and Rhythm: Normal rate and regular rhythm.     Heart sounds: No murmur heard. No  friction rub. No gallop.   Pulmonary:     Breath sounds: No stridor. No wheezing or rales.  Chest:     Chest wall: No tenderness.  Abdominal:     General: There is no distension.     Tenderness: There is no abdominal tenderness. There is no rebound.  Genitourinary:    Comments: Tender right flank Musculoskeletal:        General: Normal range of motion.     Cervical back: Neck supple.  Lymphadenopathy:     Cervical: No cervical adenopathy.  Skin:    Findings: No erythema or rash.  Neurological:     Mental Status: He is alert and oriented to person, place, and time.     Motor: No abnormal muscle tone.     Coordination: Coordination normal.  Psychiatric:        Behavior: Behavior normal.     ED Results / Procedures / Treatments   Labs (all labs ordered are listed, but only abnormal results are displayed) Labs Reviewed  CBC WITH DIFFERENTIAL/PLATELET - Abnormal; Notable for the following components:      Result Value   WBC 3.9 (*)    RBC 3.74 (*)    Hemoglobin 12.3 (*)    HCT 36.8 (*)    All other components within normal limits  COMPREHENSIVE METABOLIC PANEL - Abnormal; Notable for the following components:   Glucose, Bld 137 (*)    BUN 26 (*)    Creatinine, Ser 1.39 (*)    Albumin 3.4 (*)    GFR, Estimated 48 (*)    All other components within normal limits  URINALYSIS, ROUTINE W REFLEX MICROSCOPIC    EKG None  Radiology CT Renal Stone Study  Result Date: 05/03/2020 CLINICAL DATA:  Right-sided flank pain. Urinary frequency. History of prostate cancer. EXAM: CT ABDOMEN AND PELVIS WITHOUT CONTRAST TECHNIQUE: Multidetector CT imaging of the abdomen and pelvis was performed following the standard protocol without IV contrast. COMPARISON:  09/15/2019 and 09/15/2019. FINDINGS: Lower chest: [place] nodule in the posterior right lower lobe (5/57), unchanged. Mild volume loss in the lingula and both lower lobes. Heart is enlarged. Atherosclerotic calcification of the  aorta, aortic valve and coronary arteries. No pericardial or pleural effusion. Distal esophagus is unremarkable. Hepatobiliary: Visualized portions of the liver is unremarkable. Cholecystectomy. Stable associated  biliary ductal dilatation. Pancreas: Negative. Spleen: Negative. Adrenals/Urinary Tract: New right adrenal mass measures 2.3 x 3.2 cm and 35 Hounsfield units. Left adrenal gland is unremarkable. Low-attenuation lesions in the kidneys measure up to 2.3 cm on the right and were better evaluated on 09/16/2019. Subcentimeter hyper dense lesions are too small to characterize. No urinary stones. Ureters are decompressed. Bladder is grossly unremarkable. Stomach/Bowel: Stomach and small bowel are unremarkable. Right hemicolectomy. Colon is otherwise unremarkable. Vascular/Lymphatic: Atherosclerotic calcification of the aorta. Bilateral common iliac artery aneurysms, 2.2 cm in the right and 2.0 cm on the left. 9 mm right external iliac lymph node (3/67) has decreased in size from 1.4 cm on 09/15/2019. 9 mm lymph node in the sigmoid mesentery (3/69) is stable when remeasured. No additional pathologically enlarged lymph nodes. Reproductive: Brachytherapy seeds in the prostate. Other: No free fluid. Mesenteries and peritoneum are otherwise unremarkable. Musculoskeletal: Small sclerotic lesions in the pubic bones, unchanged from 08/09/2013. A sclerotic lesion in the right L2 vertebral body is stable from the most recent prior examination but is new from 2015. Degenerative changes in the spine and hips, right greater than left. Levoconvex scoliosis. IMPRESSION: 1. New right adrenal mass, highly worrisome for metastatic disease. 2. Bilateral renal lesions, characterized as enhancing and therefore malignant on 09/16/2019. 3. Indeterminate L2 vertebral body sclerotic lesion. 4. Interval decrease in size of a right external iliac lymph node. 9 mm lymph node in the sigmoid mesentery is stable. 5. Aortic atherosclerosis  (ICD10-I70.0). Coronary artery calcification. Bilateral common iliac artery aneurysms. Electronically Signed   By: Lorin Picket M.D.   On: 05/03/2020 10:19    Procedures Procedures   Medications Ordered in ED Medications - No data to display  ED Course  I have reviewed the triage vital signs and the nursing notes.  Pertinent labs & imaging results that were available during my care of the patient were reviewed by me and considered in my medical decision making (see chart for details).    MDM Rules/Calculators/A&P                          Patient with back pain and renal tumors.  I have spoke with Dr. Delton Coombes who will follow him up in the office Final Clinical Impression(s) / ED Diagnoses Final diagnoses:  Flank pain    Rx / DC Orders ED Discharge Orders         Ordered    HYDROcodone-acetaminophen (NORCO/VICODIN) 5-325 MG tablet  Every 6 hours PRN        05/03/20 1341           Milton Ferguson, MD 05/06/20 1044

## 2020-05-03 NOTE — ED Triage Notes (Signed)
Pt presents to ED with complaints of right sided flank pain for a couple months, urine frequency

## 2020-05-16 ENCOUNTER — Encounter (HOSPITAL_COMMUNITY): Payer: Self-pay | Admitting: *Deleted

## 2020-05-17 ENCOUNTER — Inpatient Hospital Stay (HOSPITAL_COMMUNITY): Payer: No Typology Code available for payment source | Admitting: Hematology and Oncology

## 2020-05-22 ENCOUNTER — Ambulatory Visit (HOSPITAL_COMMUNITY): Payer: No Typology Code available for payment source | Admitting: Hematology

## 2020-06-05 ENCOUNTER — Encounter (HOSPITAL_COMMUNITY): Payer: Self-pay

## 2020-06-06 ENCOUNTER — Other Ambulatory Visit: Payer: Self-pay

## 2020-06-06 ENCOUNTER — Other Ambulatory Visit (HOSPITAL_COMMUNITY): Payer: Self-pay

## 2020-06-06 ENCOUNTER — Inpatient Hospital Stay (HOSPITAL_COMMUNITY): Payer: Medicare Other | Attending: Hematology | Admitting: Hematology

## 2020-06-06 ENCOUNTER — Inpatient Hospital Stay (HOSPITAL_COMMUNITY): Payer: Medicare Other

## 2020-06-06 ENCOUNTER — Encounter (HOSPITAL_COMMUNITY): Payer: Self-pay

## 2020-06-06 VITALS — BP 146/80 | HR 77 | Temp 96.8°F | Resp 18 | Ht 73.0 in | Wt 192.1 lb

## 2020-06-06 DIAGNOSIS — C642 Malignant neoplasm of left kidney, except renal pelvis: Secondary | ICD-10-CM | POA: Insufficient documentation

## 2020-06-06 DIAGNOSIS — C641 Malignant neoplasm of right kidney, except renal pelvis: Secondary | ICD-10-CM | POA: Diagnosis not present

## 2020-06-06 DIAGNOSIS — N2889 Other specified disorders of kidney and ureter: Secondary | ICD-10-CM | POA: Insufficient documentation

## 2020-06-06 DIAGNOSIS — M549 Dorsalgia, unspecified: Secondary | ICD-10-CM | POA: Insufficient documentation

## 2020-06-06 DIAGNOSIS — Z8546 Personal history of malignant neoplasm of prostate: Secondary | ICD-10-CM | POA: Diagnosis not present

## 2020-06-06 DIAGNOSIS — C7971 Secondary malignant neoplasm of right adrenal gland: Secondary | ICD-10-CM | POA: Insufficient documentation

## 2020-06-06 DIAGNOSIS — E278 Other specified disorders of adrenal gland: Secondary | ICD-10-CM

## 2020-06-06 LAB — COMPREHENSIVE METABOLIC PANEL
ALT: 18 U/L (ref 0–44)
AST: 22 U/L (ref 15–41)
Albumin: 3.7 g/dL (ref 3.5–5.0)
Alkaline Phosphatase: 71 U/L (ref 38–126)
Anion gap: 6 (ref 5–15)
BUN: 20 mg/dL (ref 8–23)
CO2: 25 mmol/L (ref 22–32)
Calcium: 9.1 mg/dL (ref 8.9–10.3)
Chloride: 105 mmol/L (ref 98–111)
Creatinine, Ser: 1.17 mg/dL (ref 0.61–1.24)
GFR, Estimated: 58 mL/min — ABNORMAL LOW (ref >60)
Glucose, Bld: 101 mg/dL — ABNORMAL HIGH (ref 70–99)
Potassium: 4 mmol/L (ref 3.5–5.1)
Sodium: 136 mmol/L (ref 135–145)
Total Bilirubin: 1.1 mg/dL (ref 0.3–1.2)
Total Protein: 7.6 g/dL (ref 6.5–8.1)

## 2020-06-06 LAB — CBC WITH DIFFERENTIAL/PLATELET
Abs Immature Granulocytes: 0 10*3/uL (ref 0.00–0.07)
Basophils Absolute: 0 10*3/uL (ref 0.0–0.1)
Basophils Relative: 0 %
Eosinophils Absolute: 0.1 10*3/uL (ref 0.0–0.5)
Eosinophils Relative: 2 %
HCT: 40.8 % (ref 39.0–52.0)
Hemoglobin: 13.5 g/dL (ref 13.0–17.0)
Immature Granulocytes: 0 %
Lymphocytes Relative: 36 %
Lymphs Abs: 1.6 10*3/uL (ref 0.7–4.0)
MCH: 32.9 pg (ref 26.0–34.0)
MCHC: 33.1 g/dL (ref 30.0–36.0)
MCV: 99.5 fL (ref 80.0–100.0)
Monocytes Absolute: 0.5 10*3/uL (ref 0.1–1.0)
Monocytes Relative: 11 %
Neutro Abs: 2.2 10*3/uL (ref 1.7–7.7)
Neutrophils Relative %: 51 %
Platelets: 186 10*3/uL (ref 150–400)
RBC: 4.1 MIL/uL — ABNORMAL LOW (ref 4.22–5.81)
RDW: 13.2 % (ref 11.5–15.5)
WBC: 4.3 10*3/uL (ref 4.0–10.5)
nRBC: 0 % (ref 0.0–0.2)

## 2020-06-06 LAB — PSA: Prostatic Specific Antigen: 10.21 ng/mL — ABNORMAL HIGH (ref 0.00–4.00)

## 2020-06-06 LAB — LACTATE DEHYDROGENASE: LDH: 189 U/L (ref 98–192)

## 2020-06-06 MED ORDER — TRAMADOL HCL 50 MG PO TABS: 50.0000 mg | ORAL_TABLET | Freq: Two times a day (BID) | ORAL | 0 refills | Status: DC | PRN

## 2020-06-06 NOTE — Patient Instructions (Signed)
[facility] Discharge Instructions  You were seen and examined today by Dr. Delton Coombes. Dr. Delton Coombes is a medical oncologist, meaning he specializes in the management of cancer diagnoses with medications. Dr. Delton Coombes discussed your past medical history, family history of cancer and the events that led to you being here today.  Last July, a CT scan revealed lesions in both of your kidneys. This is concerning for cancer. There is also a mass noted in your right adrenal gland on your latest CT scan, this is the gland that sits on top of the kidneys. Dr. Delton Coombes has recommended additional imaging, including a CT of your chest, abdomen and pelvis. This will help to identify if there is cancer spread to other organs in your body. Dr. Delton Coombes has also recommended a Bone Scan, this is because it was unclear if there was any cancer impacting the bones. These scans will help identify an area that is able to be biopsied.  Dr. Delton Coombes has also recommended additional lab work, the PSA and LDH are often elevated in the presence of cancer. We will check your blood counts and electrolytes as well.   Thank you for choosing [facility] to provide your oncology and hematology care.  To afford each patient quality time with our provider, please arrive at least 15 minutes before your scheduled appointment time.   If you have a lab appointment with the [facility] please come in thru the Main Entrance and check in at the main information desk.  You need to re-schedule your appointment should you arrive 10 or more minutes late.  We strive to give you quality time with our providers, and arriving late affects you and other patients whose appointments are after yours.  Also, if you no show three or more times for appointments you may be dismissed from the clinic at the providers discretion.     Again, thank you for choosing [facility].  Our hope is that these requests will decrease the amount of time that you wait before being seen by our physicians.       _____________________________________________________________  Should you have questions after your visit to [facility], please contact our office at [phone] and follow the prompts.  Our office hours are 8:00 a.m. and 4:30 p.m. Monday - Friday.  Please note that voicemails left after 4:00 p.m. may not be returned until the following business day.  We are closed weekends and major holidays.  You do have access to a nurse 24-7, just call the main number to the clinic [phone] and do not press any options, hold on the line and a nurse will answer the phone.    For prescription refill requests, have your pharmacy contact our office and allow 72 hours.    Due to Covid, you will need to wear a mask upon entering the hospital. If you do not have a mask, a mask will be given to you at the Main Entrance upon arrival. For doctor visits, patients may have 1 support person age 59 or older with them. For treatment visits, patients can not have anyone with them due to social distancing guidelines and our immunocompromised population.

## 2020-06-06 NOTE — Progress Notes (Signed)
I met with the patient and his daughter today during the patient's initial visit with Dr. Delton Coombes. I introduced myself and explained my role in the patient's care. I provided my contact information and encouraged the patient to call with questions or concerns.

## 2020-06-06 NOTE — Progress Notes (Signed)
[place], 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 87 y.o. 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 Milton Ferguson from [facility]. He came to [facility] on 05/03/2020 after having constant right flank pain which started on 03/16 without CP, abdominal pain nor headaches. He was previously seen at [facility] on 09/15/2019 for a renal mass.  Today he is accompanied by his daughter, Derrick Long, and he reports feeling fair. He complains of having daily right lower back aching pain which has been intermittent for the past 2 years; the pain is sharp when he wakes up in the morning. He does not take any meds for the pain and moves around for the pain to improve. He reports that he lost about 9 lbs in 6 months. He denies having any recent falls and he walks with a cane. His appetite has been decreased. He is urinating frequently. He denies having hematuria, N/V/D, F/C or night sweats. He was treated for his prostate cancer in the 1990's with 18 seeds.  He lives with his daughter. He is active with his yard and garden growing vegetables. 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 He reports having direct chemical exposure, though he cannot recall exactly which chemicals. He quit smoking in 1997 after smoking 1/2 PPD. His mother had some kind of cancer and he does not know about the rest of his family members.  MEDICAL HISTORY:  Past Medical History:  Diagnosis Date  . Acid reflux    . Anemia   . Ex-cigarette smoker   . Gout   . Hyperlipemia   . Hypertension   . Prostate cancer ([facility]) 6.23.2015   SEEDS 20 YEARS AGO  . Prostate disease     SURGICAL HISTORY: Past Surgical History:  Procedure Laterality Date  . LEFT HEART CATHETERIZATION WITH CORONARY ANGIOGRAM N/A 07/21/2012   Procedure: LEFT HEART CATHETERIZATION WITH CORONARY ANGIOGRAM;  Surgeon: Troy Sine, MD;  Location[facility] C[facility] LAB;  Service: Cardiovascular;  Laterality: N/A;  . PACEMAKER INSERTION    . PERMANENT PACEMAKER INSERTION N/A 07/22/2012   Procedure: PERMANENT PACEMAKER INSERTION;  Surgeon: Sanda Klein, MD;  Location: [facility] CATH LAB;  Service: Cardiovascular;  Laterality: N/A;  . PROSTATE SURGERY      SOCIAL HISTORY: Social History   Socioeconomic History  . Marital status: Married    Spouse name: Not on file  . Number of children: Not on file  . Years of education: Not on file  . Highest education level: Not on file  Occupational History  . Not on file  Tobacco Use  . Smoking status: Former Smoker    Quit date: 07/20/2002    Years since quitting: 17.8  . Smokeless tobacco: Never Used  Vaping Use  . Vaping Use: Never used  Substance and Sexual Activity  . Alcohol use: No  . Drug use: No  . Sexual activity: Not on file  Other Topics Concern  . Not on file  Social History Narrative  .  Not on file   Social Determinants of Health   Financial Resource Strain: Low Risk   . Difficulty of Paying Living Expenses: Not hard at all  Food Insecurity: No Food Insecurity  . Worried About Charity fundraiser in the Last Year: Never true  . Ran Out of Food in the Last Year: Never true  Transportation Needs: No Transportation Needs  . Lack of Transportation (Medical): No  . Lack of Transportation (Non-Medical): No  Physical Activity: Insufficiently Active  . Days of Exercise per Week: 3 days  . Minutes of Exercise per Session: 30 min  Stress: No Stress Concern Present  . Feeling of Stress :  Not at all  Social Connections: Moderately Integrated  . Frequency of Communication with Friends and Family: More than three times a week  . Frequency of Social Gatherings with Friends and Family: More than three times a week  . Attends Religious Services: More than 4 times per year  . Active Member of Clubs or Organizations: No  . Attends Archivist Meetings: 1 to 4 times per year  . Marital Status: Widowed  Intimate Partner Violence: Not At Risk  . Fear of Current or Ex-Partner: No  . Emotionally Abused: No  . Physically Abused: No  . Sexually Abused: No    FAMILY HISTORY: No family history on file.  ALLERGIES:  has No Known Allergies.  MEDICATIONS:  Current Outpatient Medications  Medication Sig Dispense Refill  . ALPRAZolam (XANAX) 0.5 MG tablet Take 1 tablet (0.5 mg total) by mouth at bedtime as needed for sleep. 15 tablet 0  . amLODipine (NORVASC) 10 MG tablet Take 10 mg by mouth every morning.     Marland Kitchen ascorbic acid (VITAMIN C) 250 MG tablet TAKE ONE TABLET BY MOUTH THREE TIMES A DAY WITH MEALS - TAKE WITH IRON TO PROMOTE BETTER ABSORPTION    . atorvastatin (LIPITOR) 20 MG tablet Take 1 tablet (20 mg total) by mouth daily at 6 PM. 30 tablet 5  . carvedilol (COREG) 25 MG tablet Take 25 mg by mouth 2 (two) times daily with a meal.    . Cholecalciferol 25 MCG (1000 UT) tablet Take 1,000 Units by mouth daily.    . colchicine 0.6 MG tablet Take 1 tablet (0.6 mg total) by mouth 2 (two) times daily as needed (gout). 30 tablet 0  . diclofenac sodium (VOLTAREN) 1 % GEL Apply 2 g topically 4 (four) times daily. 100 g 0  . ferrous sulfate 325 (65 FE) MG tablet Take 325 mg by mouth 2 (two) times daily with a meal.    . fluticasone (FLONASE) 50 MCG/ACT nasal spray Place 2 sprays into both nostrils daily. 16 g 2  . furosemide (LASIX) 40 MG tablet Take 40 mg by mouth daily.    Marland Kitchen guaiFENesin-dextromethorphan (ROBITUSSIN DM) 100-10 MG/5ML syrup Take 5 mLs by mouth every 4 (four) hours  as needed for cough. 118 mL 0  . HYDROcodone-acetaminophen (NORCO/VICODIN) 5-325 MG tablet Take 1 tablet by mouth every 6 (six) hours as needed for moderate pain. 20 tablet 0  . loperamide (IMODIUM) 2 MG capsule Take 1 capsule (2 mg total) by mouth 4 (four) times daily as needed for diarrhea or loose stools. 12 capsule 0  . meloxicam (MOBIC) 15 MG tablet Take 1 tablet (15 mg total) by mouth daily as needed for pain. 30 tablet 0  . Menthol-Methyl Salicylate (THERA-GESIC) 0.5-15 % CREA Apply 1 application topically 3 (three) times daily as needed for  pain.    . methocarbamol (ROBAXIN) 500 MG tablet Take 1 tablet (500 mg total) by mouth every 8 (eight) hours as needed for muscle spasms. 20 tablet 0  . Nutritional Supplements (,FEEDING SUPPLEMENT, PROSOURCE PLUS) liquid Take 30 mLs by mouth 2 (two) times daily between meals. 887 mL 3  . omeprazole (PRILOSEC) 20 MG capsule Take 20 mg by mouth every morning.     . potassium chloride SA (KLOR-CON) 20 MEQ tablet Take 20 mEq by mouth daily.    . rivaroxaban (XARELTO) 20 MG TABS tablet Take 1 tablet (20 mg total) by mouth daily with supper. 20 tablet 0  . tamsulosin (FLOMAX) 0.4 MG CAPS capsule Take 0.8 mg by mouth at bedtime.    . traMADol (ULTRAM) 50 MG tablet Take 1 tablet (50 mg total) by mouth every 6 (six) hours as needed for moderate pain or severe pain. 20 tablet 0   No current facility-administered medications for this visit.    REVIEW OF SYSTEMS:   Review of Systems  Constitutional: Positive for appetite change (25%), fatigue (depleted) and unexpected weight change (lost 6 lbs in 9 months). Negative for chills, diaphoresis and fever.  Cardiovascular: Positive for leg swelling (knees & ankles).  Gastrointestinal: Negative for diarrhea.  Genitourinary: Negative for hematuria.   Musculoskeletal: Positive for back pain (5/10 R flank pain).  All other systems reviewed and are negative.    PHYSICAL EXAMINATION: ECOG PERFORMANCE STATUS: 1 -  Symptomatic but completely ambulatory  Vitals:   06/06/20 0819  BP: (!) 146/80  Pulse: 77  Resp: 18  Temp: (!) 96.8 F (36 C)  SpO2: 98%   Filed Weights   06/06/20 0819  Weight: 192 lb 1.6 oz (87.1 kg)   Physical Exam Vitals reviewed.  Constitutional:      Appearance: Normal appearance.  Cardiovascular:     Rate and Rhythm: Normal rate and regular rhythm.     Pulses: Normal pulses.     Heart sounds: Normal heart sounds.  Pulmonary:     Effort: Pulmonary effort is normal.     Breath sounds: Normal breath sounds.  Chest:  Breasts:     Right: No supraclavicular adenopathy.     Left: No supraclavicular adenopathy.    Abdominal:     Palpations: Abdomen is soft. There is no hepatomegaly or mass.     Tenderness: There is no abdominal tenderness.     Hernia: No hernia is present.  Musculoskeletal:     Right lower leg: No edema.     Left lower leg: No edema.  Lymphadenopathy:     Cervical: No cervical adenopathy.     Upper Body:     Right upper body: No supraclavicular adenopathy.     Left upper body: No supraclavicular adenopathy.     Lower Body: No right inguinal adenopathy. No left inguinal adenopathy.  Neurological:     General: No focal deficit present.     Mental Status: He is alert and oriented to person, place, and time.  Psychiatric:        Mood and Affect: Mood normal.        Behavior: Behavior normal.      LABORATORY DATA:  I have reviewed the data as listed CBC Latest Ref Rng & Units 05/03/2020 09/18/2019 09/17/2019  WBC 4.0 - 10.5 K/uL 3.9(L) 8.1 5.4  Hemoglobin 13.0 - 17.0 g/dL 12.3(L) 10.1(L) 10.7(L)  Hematocrit 39.0 - 52.0 % 36.8(L) 31.6(L) 33.0(L)  Platelets 150 - 400 K/uL 183 142(L) 128(L)  CMP Latest Ref Rng & Units 05/03/2020 09/18/2019 09/17/2019  Glucose 70 - 99 mg/dL 137(H) 92 87  BUN 8 - 23 mg/dL 26(H) 17 18  Creatinine 0.61 - 1.24 mg/dL 1.39(H) 1.62(H) 1.63(H)  Sodium 135 - 145 mmol/L 136 138 137  Potassium 3.5 - 5.1 mmol/L 3.5 3.9 3.8   Chloride 98 - 111 mmol/L 103 111 109  CO2 22 - 32 mmol/L 25 21(L) 19(L)  Calcium 8.9 - 10.3 mg/dL 8.9 8.3(L) 8.4(L)  Total Protein 6.5 - 8.1 g/dL 7.2 6.5 6.2(L)  Total Bilirubin 0.3 - 1.2 mg/dL 0.8 0.9 0.8  Alkaline Phos 38 - 126 U/L 72 50 57  AST 15 - 41 U/L 19 34 33  ALT 0 - 44 U/L 12 23 27     RADIOGRAPHIC STUDIES: I have personally reviewed the radiological images as listed and agreed with the findings in the report. No results found.  ASSESSMENT:  1.  Bilateral renal neoplasms with metastasis to right adrenal gland: -Presentation to the ER with worsening right-sided back pain, which has been on and off for the last couple of years. - Some decrease in appetite with 6 pound weight loss in the last 6 months.  No hematuria or other B symptoms. - CT renal study on 05/03/2020 showed new right adrenal mass with bilateral renal lesions.  Indeterminate L2 vertebral bodies sclerotic lesion.  Low-attenuation lesions in the kidneys measure up to 2.3 cm on the right and were similar to scan from July 2021.  2.  Social/family history: - He lives at home with his daughter.  He walks with a cane. - He mows lawn with a riding mower.  He has a garden and gross vegetables.  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  Quit smoking 25 years ago. - No family history of malignancies that he is aware of.   PLAN:  1.  Bilateral renal neoplasms with metastasis to right adrenal gland: - We have reviewed images of the CT scan from the ER visit. - We discussed the differential diagnosis in detail. - I have recommended CT CAP and bone scan.  We will also check LDH and PSA. - RTC after scans to discuss further plan including biopsy.  2.  Right-sided back pain: - He reports pain on and off for the last 2 years.  It varies from dull to sharp and mostly in the mornings lasting about 2 to 3 hours. - He is taking Tylenol which is not helping.  We will start him on tramadol 50 mg twice daily as needed. - He  was asked to use stool softener.   All questions were answered. The patient knows to call the clinic with any problems, questions or concerns.    Derek Jack, MD, 06/06/20 9:03 AM  [place] [phone]   I, Milinda Antis, am acting as a scribe for Dr. Sanda Linger.  I, Derek Jack MD, have reviewed the above documentation for accuracy and completeness, and I agree with the above.

## 2020-06-12 ENCOUNTER — Ambulatory Visit (HOSPITAL_COMMUNITY)
Admission: RE | Admit: 2020-06-12 | Discharge: 2020-06-12 | Disposition: A | Discharge status: Home / Self Care | Payer: Medicare Other | Source: Ambulatory Visit | Attending: Hematology | Admitting: Hematology

## 2020-06-12 ENCOUNTER — Other Ambulatory Visit: Payer: Self-pay

## 2020-06-12 DIAGNOSIS — C642 Malignant neoplasm of left kidney, except renal pelvis: Secondary | ICD-10-CM | POA: Diagnosis not present

## 2020-06-12 DIAGNOSIS — C641 Malignant neoplasm of right kidney, except renal pelvis: Secondary | ICD-10-CM | POA: Diagnosis present

## 2020-06-12 DIAGNOSIS — E278 Other specified disorders of adrenal gland: Secondary | ICD-10-CM | POA: Diagnosis present

## 2020-06-12 MED ORDER — IOHEXOL 300 MG/ML  SOLN
100.0000 mL | Freq: Once | INTRAMUSCULAR | Status: AC | PRN
  Administered 2020-06-12: 100 mL via INTRAVENOUS

## 2020-06-13 ENCOUNTER — Encounter (HOSPITAL_COMMUNITY): Payer: Self-pay

## 2020-06-13 ENCOUNTER — Encounter (HOSPITAL_COMMUNITY)
Admission: RE | Admit: 2020-06-13 | Discharge: 2020-06-13 | Disposition: A | Discharge status: Home / Self Care | Payer: Medicare Other | Source: Ambulatory Visit | Attending: Hematology | Admitting: Hematology

## 2020-06-13 DIAGNOSIS — C641 Malignant neoplasm of right kidney, except renal pelvis: Secondary | ICD-10-CM | POA: Diagnosis present

## 2020-06-13 DIAGNOSIS — E278 Other specified disorders of adrenal gland: Secondary | ICD-10-CM | POA: Diagnosis present

## 2020-06-13 DIAGNOSIS — C642 Malignant neoplasm of left kidney, except renal pelvis: Secondary | ICD-10-CM | POA: Diagnosis present

## 2020-06-13 MED ORDER — TECHNETIUM TC 99M MEDRONATE IV KIT
20.0000 | PACK | Freq: Once | INTRAVENOUS | Status: AC | PRN
  Administered 2020-06-13: 21 via INTRAVENOUS

## 2020-06-17 ENCOUNTER — Inpatient Hospital Stay (HOSPITAL_COMMUNITY): Payer: Medicare Other | Attending: Hematology | Admitting: Hematology

## 2020-06-17 ENCOUNTER — Encounter (HOSPITAL_COMMUNITY): Payer: Self-pay | Admitting: Hematology

## 2020-06-17 ENCOUNTER — Other Ambulatory Visit: Payer: Self-pay

## 2020-06-17 VITALS — BP 159/70 | HR 76 | Temp 97.0°F | Resp 18 | Wt 191.4 lb

## 2020-06-17 DIAGNOSIS — Z5111 Encounter for antineoplastic chemotherapy: Secondary | ICD-10-CM | POA: Diagnosis present

## 2020-06-17 DIAGNOSIS — C61 Malignant neoplasm of prostate: Secondary | ICD-10-CM | POA: Diagnosis present

## 2020-06-17 DIAGNOSIS — E278 Other specified disorders of adrenal gland: Secondary | ICD-10-CM | POA: Diagnosis not present

## 2020-06-17 DIAGNOSIS — C642 Malignant neoplasm of left kidney, except renal pelvis: Secondary | ICD-10-CM | POA: Insufficient documentation

## 2020-06-17 DIAGNOSIS — C641 Malignant neoplasm of right kidney, except renal pelvis: Secondary | ICD-10-CM | POA: Insufficient documentation

## 2020-06-17 DIAGNOSIS — C7971 Secondary malignant neoplasm of right adrenal gland: Secondary | ICD-10-CM | POA: Diagnosis not present

## 2020-06-17 MED ORDER — LANREOTIDE ACETATE 120 MG/0.5ML ~~LOC~~ SOLN
SUBCUTANEOUS | Status: AC
  Filled 2020-06-17: qty 120

## 2020-06-17 NOTE — Progress Notes (Signed)
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    CANCER STAGING: Cancer Staging No matching staging information was found for the patient.  INTERVAL HISTORY:  [name], a 87 y.o. 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 Mattheo was last seen on 06/06/2020.   Today he is accompanied by his daughter and he reports feeling okay. His energy levels are still decreased though he is trying to take care of his garden. He complains of having incontinence since having the seeds placed. He is currently taking Xarelto for his a-fib and pacemaker but denies having nosebleeds, hematochezia or hematuria. His appetite is also decreased.  He was exposed to chemicals during the [place] war.    REVIEW OF SYSTEMS:  Review of Systems  Constitutional: Positive for appetite change (50%) and fatigue (50%).  HENT:   Negative for nosebleeds.   Cardiovascular: Positive for leg swelling.  Gastrointestinal: Negative for blood in stool.  Genitourinary: Positive for bladder incontinence (since seeds placement). Negative for hematuria.   All other systems reviewed and are negative.   PAST MEDICAL/SURGICAL HISTORY:  Past Medical History:  Diagnosis Date  . Acid reflux   . Anemia   . Ex-cigarette smoker   . Gout   . Hyperlipemia   . Hypertension   . Paroxysmal atrial fibrillation ([place]) 2014  . Prostate cancer ([place]) 6.23.2015   SEEDS 20 YEARS AGO  . Prostate disease    Past Surgical History:  Procedure Laterality Date  . LEFT HEART  CATHETERIZATION WITH CORONARY ANGIOGRAM N/A 07/21/2012   Procedure: LEFT HEART CATHETERIZATION WITH CORONARY ANGIOGRAM;  Surgeon: Troy Sine, MD;  Location: [facility] CATH LAB;  Service: Cardiovascular;  Laterality: N/A;  . PACEMAKER INSERTION    . PERMANENT PACEMAKER INSERTION N/A 07/22/2012   Procedure: PERMANENT PACEMAKER INSERTION;  Surgeon: Sanda Klein, MD;  Location: [facility] CATH LAB;  Service: Cardiovascular;  Laterality: N/A;  . PROSTATE SURGERY      SOCIAL HISTORY:  Social History   Socioeconomic History  . Marital status: Married    Spouse name: Not on file  . Number of children: Not on file  . Years of education: Not on file  . Highest education level: Not on file  Occupational History  . Not on file  Tobacco Use  . Smoking status: Former Smoker    Quit date: 07/20/2002    Years since quitting: 17.9  . Smokeless tobacco: Never Used  Vaping Use  . Vaping Use: Never used  Substance and Sexual Activity  . Alcohol use: No  . Drug use: No  . Sexual activity: Not on file  Other Topics Concern  . Not on file  Social History Narrative  . Not on file   Social Determinants of Health   Financial Resource Strain: Low Risk   . Difficulty of Paying Living Expenses: Not hard at all  Food Insecurity: No Food Insecurity  . Worried About Charity fundraiser in the Last Year: Never true  . Ran Out of Food in the Last Year: Never true  Transportation Needs: No Transportation Needs  . Lack of Transportation (Medical): No  . Lack of Transportation (Non-Medical): No  Physical Activity: Insufficiently Active  . Days of Exercise per Week: 3 days  . Minutes of Exercise per Session: 30 min  Stress: No Stress Concern Present  . Feeling of Stress : Not at all  Social Connections: Moderately Integrated  . Frequency of Communication with Friends and Family: More than three times a week  . Frequency of Social Gatherings with Friends and Family: More than three times a week  . Attends Religious  Services: More than 4 times per year  . Active Member of Clubs or Organizations: No  . Attends Archivist Meetings: 1 to 4 times per year  . Marital Status: Widowed  Intimate Partner Violence: Not At Risk  . Fear of Current or Ex-Partner: No  . Emotionally Abused: No  . Physically Abused: No  . Sexually Abused: No    FAMILY HISTORY:  History reviewed. No pertinent family history.  CURRENT MEDICATIONS:  Current Outpatient Medications  Medication Sig Dispense Refill  . ALPRAZolam (XANAX) 0.5 MG tablet Take 1 tablet (0.5 mg total) by mouth at bedtime as needed for sleep. 15 tablet 0  . amLODipine (NORVASC) 10 MG tablet Take 10 mg by mouth every morning.     Marland Kitchen ascorbic acid (VITAMIN C) 250 MG tablet TAKE ONE TABLET BY MOUTH THREE TIMES A DAY WITH MEALS - TAKE WITH IRON TO PROMOTE BETTER ABSORPTION    . atorvastatin (LIPITOR) 20 MG tablet Take 1 tablet (20 mg total) by mouth daily at 6 PM. 30 tablet 5  . carvedilol (COREG) 25 MG tablet Take 25 mg by mouth 2 (two) times daily with a meal.    . Cholecalciferol 25 MCG (1000 UT) tablet Take 1,000 Units by mouth daily.    . colchicine 0.6 MG tablet Take 1 tablet (0.6 mg total) by mouth 2 (two) times daily as needed (gout). 30 tablet 0  . diclofenac sodium (VOLTAREN) 1 % GEL Apply 2 g topically 4 (four) times daily. 100 g 0  . ferrous sulfate 325 (65 FE) MG tablet Take 325 mg by mouth 2 (two) times daily with a meal.    . fluticasone (FLONASE) 50 MCG/ACT nasal spray Place 2 sprays into both nostrils daily. 16 g 2  . furosemide (LASIX) 40 MG tablet Take 40 mg by mouth daily.    Marland Kitchen guaiFENesin-dextromethorphan (ROBITUSSIN DM) 100-10 MG/5ML syrup Take 5 mLs by mouth every 4 (four) hours as needed for cough. 118 mL 0  . HYDROcodone-acetaminophen (NORCO/VICODIN) 5-325 MG tablet Take 1 tablet by mouth every 6 (six) hours as needed for moderate pain. 20 tablet 0  . loperamide (IMODIUM) 2 MG capsule Take 1 capsule (2 mg total) by mouth 4 (four)  times daily as needed for diarrhea or loose stools. 12 capsule 0  . meloxicam (MOBIC) 15 MG tablet Take 1 tablet (15 mg total) by mouth daily as needed for pain. 30 tablet 0  . Menthol-Methyl Salicylate (THERA-GESIC) 0.5-15 % CREA Apply 1 application topically 3 (three) times daily as needed for pain.    . methocarbamol (ROBAXIN) 500 MG tablet Take 1 tablet (500 mg total) by mouth every 8 (eight) hours as needed for muscle spasms. 20 tablet 0  . Nutritional Supplements (,FEEDING SUPPLEMENT, PROSOURCE PLUS) liquid Take 30 mLs by mouth 2 (two) times daily between meals. 887 mL 3  . omeprazole (PRILOSEC) 20 MG capsule Take 20 mg by mouth  every morning.     . potassium chloride SA (KLOR-CON) 20 MEQ tablet Take 20 mEq by mouth daily.    . rivaroxaban (XARELTO) 20 MG TABS tablet Take 1 tablet (20 mg total) by mouth daily with supper. 20 tablet 0  . tamsulosin (FLOMAX) 0.4 MG CAPS capsule Take 0.8 mg by mouth at bedtime.    . traMADol (ULTRAM) 50 MG tablet Take 1 tablet (50 mg total) by mouth every 12 (twelve) hours as needed for moderate pain or severe pain. 30 tablet 0   No current facility-administered medications for this visit.    ALLERGIES:  No Known Allergies  PHYSICAL EXAM:  Performance status (ECOG): 1 - Symptomatic but completely ambulatory  Vitals:   06/17/20 0943  BP: (!) 159/70  Pulse: 76  Resp: 18  Temp: (!) 97 F (36.1 C)  SpO2: 100%   Wt Readings from Last 3 Encounters:  06/17/20 191 lb 6.4 oz (86.8 kg)  06/06/20 192 lb 1.6 oz (87.1 kg)  05/03/20 201 lb (91.2 kg)   Physical Exam Vitals reviewed.  Constitutional:      Appearance: Normal appearance.  Cardiovascular:     Rate and Rhythm: Normal rate and regular rhythm.     Pulses: Normal pulses.     Heart sounds: Normal heart sounds.  Pulmonary:     Effort: Pulmonary effort is normal.     Breath sounds: Normal breath sounds.  Musculoskeletal:     Right lower leg: No edema.     Left lower leg: No edema.   Neurological:     General: No focal deficit present.     Mental Status: He is alert and oriented to person, place, and time.  Psychiatric:        Mood and Affect: Mood normal.        Behavior: Behavior normal.      LABORATORY DATA:  I have reviewed the labs as listed.  CBC Latest Ref Rng & Units 06/06/2020 05/03/2020 09/18/2019  WBC 4.0 - 10.5 K/uL 4.3 3.9(L) 8.1  Hemoglobin 13.0 - 17.0 g/dL 13.5 12.3(L) 10.1(L)  Hematocrit 39.0 - 52.0 % 40.8 36.8(L) 31.6(L)  Platelets 150 - 400 K/uL 186 183 142(L)   CMP Latest Ref Rng & Units 06/06/2020 05/03/2020 09/18/2019  Glucose 70 - 99 mg/dL 101(H) 137(H) 92  BUN 8 - 23 mg/dL 20 26(H) 17  Creatinine 0.61 - 1.24 mg/dL 1.17 1.39(H) 1.62(H)  Sodium 135 - 145 mmol/L 136 136 138  Potassium 3.5 - 5.1 mmol/L 4.0 3.5 3.9  Chloride 98 - 111 mmol/L 105 103 111  CO2 22 - 32 mmol/L 25 25 21(L)  Calcium 8.9 - 10.3 mg/dL 9.1 8.9 8.3(L)  Total Protein 6.5 - 8.1 g/dL 7.6 7.2 6.5  Total Bilirubin 0.3 - 1.2 mg/dL 1.1 0.8 0.9  Alkaline Phos 38 - 126 U/L 71 72 50  AST 15 - 41 U/L 22 19 34  ALT 0 - 44 U/L 18 12 23    PSA 4.51 (H) 09/16/2019  PSA 10.21 (H) 06/06/2020    DIAGNOSTIC IMAGING:  I have independently reviewed the scans and discussed with the patient. NM Bone Scan Whole Body  Result Date: 06/13/2020 CLINICAL DATA:  Staging metastatic renal cell carcinoma EXAM: NUCLEAR MEDICINE WHOLE BODY BONE SCAN TECHNIQUE: Whole body anterior and posterior images were obtained approximately 3 hours after intravenous injection of radiopharmaceutical. RADIOPHARMACEUTICALS:  21.0 mCi Technetium-35m MDP IV COMPARISON:  CT, chest abdomen and pelvis from 06/12/2020. FINDINGS: Inadvertent arterial injection of the radiopharmaceutical noted  with radiotracer localization in the right hand. Normal physiologic tracer activity identified within the kidneys an urinary bladder. No abnormal foci of increased radiotracer uptake within the axial and appendicular skeleton to suggest bone  metastases. There is a mild scoliosis deformity involving the thoracolumbar spine with degenerative changes noted in the lumbar spine. Degenerative type changes are also noted within both knees as well as the left first MTP joint. IMPRESSION: No findings to suggest osseous metastasis. Electronically Signed   By: Kerby Moors M.D.   On: 06/13/2020 14:26   CT CHEST ABDOMEN PELVIS W CONTRAST  Result Date: 06/12/2020 CLINICAL DATA:  Metastatic renal cell carcinoma.  Staging workup. EXAM: CT CHEST, ABDOMEN, AND PELVIS WITH CONTRAST TECHNIQUE: Multidetector CT imaging of the chest, abdomen and pelvis was performed following the standard protocol during bolus administration of intravenous contrast. CONTRAST:  170mL OMNIPAQUE IOHEXOL 300 MG/ML  SOLN COMPARISON:  09/15/2019 FINDINGS: CT CHEST FINDINGS Cardiovascular: Ascending thoracic aortic aneurysm measures 4.5 cm in maximum diameter, image 58/3. Aortic atherosclerosis. Coronary artery atherosclerotic calcifications. Left chest wall pacer device is identified with leads in the right atrial appendage and right ventricle. Mediastinum/Nodes: No enlarged mediastinal, hilar, or axillary lymph nodes. Thyroid gland, trachea, and esophagus demonstrate no significant findings. Calcified mediastinal and hilar lymph nodes compatible with prior granulomatous disease. Lungs/Pleura: No pleural effusion. No airspace consolidation, atelectasis or pneumothorax. 4 mm peripheral nodule in the left lower lobe is unchanged from previous imaging and likely represents a benign abnormality, image 72/1. Musculoskeletal: Degenerative disc disease identified within the lower thoracic spine. No acute or suspicious osseous findings. CT ABDOMEN PELVIS FINDINGS Hepatobiliary: No suspicious liver abnormality. Previous cholecystectomy with chronic mild increase caliber of the CBD and intrahepatic bile ducts. Pancreas: Unremarkable. No pancreatic ductal dilatation or surrounding inflammatory  changes. Spleen: Normal in size without focal abnormality. Adrenals/Urinary Tract: Recently identified right adrenal mass is again noted. This measures 4.2 x 3.8 cm, image 18/3. This exerts mass effect upon the posterior and right lateral wall of the IVC. Bilateral enhancing renal neoplasms are identified: -arising off the lateral cortex of the inferior pole of right kidney is a 2.3 x 2.3 cm kidney mass, image 134/3. Arising off the inferior aspect of the right kidney is a 1.9 x 1.7 cm complex lesion, also suspicious for renal cell carcinoma. Arising off the upper pole of the left kidney is 2.2 x 2.0 cm solid-appearing lesion, image 108/3. Arising off the medial cortex of the inferior pole of left kidney is a 2.0 x 2.0 cm lesion, image 136/3. Arising from the posterior and medial cortex of the interpolar left kidney is a 1.3 x 1.5 cm lesion, image 108/3. Several low-attenuation lesions are noted within both kidneys and are favored to represent simple cysts. The largest arises from the anterior cortex of the right mid kidney measuring 1.9 by 1.4 cm, image 132/3. Lastly, there several subcentimeter low-density kidney lesions which are technically too small to reliably characterize. No hydronephrosis identified bilaterally. Urinary bladder is unremarkable. Stomach/Bowel: Stomach is within normal limits. Status post right hemicolectomy with enterocolonic anastomosis. No evidence of bowel wall thickening, distention, or inflammatory changes. Vascular/Lymphatic: Aortic atherosclerosis. No aneurysm. No abdominopelvic adenopathy. Reproductive: Prostate gland is enlarged and contains multiple seed implants. Other: No free fluid or fluid collections. Musculoskeletal: No acute or suspicious osseous findings. Multilevel lumbar degenerative disc disease. Marked right hip osteoarthritis. IMPRESSION: 1. Bilateral enhancing renal neoplasms are identified compatible with multifocal renal cell carcinomas. 2. Right adrenal mass is  again noted  compatible with metastatic disease. 3. No evidence for nodal metastasis or metastatic disease to the chest. 4. Aortic atherosclerosis and coronary artery atherosclerotic calcifications. 5. Enlarged prostate gland with multiple seed implants. 6. Aortic atherosclerosis. 7. Ascending thoracic aortic aneurysm is noted measuring 4.5 cm. Ascending thoracic aortic aneurysm. Recommend semi-annual imaging followup by CTA or MRA and referral to cardiothoracic surgery if not already obtained. This recommendation follows 2010 ACCF/AHA/AATS/ACR/ASA/SCA/SCAI/SIR/STS/SVM Guidelines for the Diagnosis and Management of Patients With Thoracic Aortic Disease. Circulation. 2010; [id number]. Aortic aneurysm NOS (ICD10-I71.9) Aortic Atherosclerosis (ICD10-I70.0). Electronically Signed   By: Kerby Moors M.D.   On: 06/12/2020 15:18     ASSESSMENT:  1.  Bilateral renal cell carcinoma with metastasis to right adrenal gland: -Presentation to the ER with worsening right-sided back pain, which has been on and off for the last couple of years. - Some decrease in appetite with 6 pound weight loss in the last 6 months.  No hematuria or other B symptoms. - CT renal study on 05/03/2020 showed new right adrenal mass with bilateral renal lesions.  Indeterminate L2 vertebral bodies sclerotic lesion.  Low-attenuation lesions in the kidneys measure up to 2.3 cm on the right and were similar to scan from July 2021. - CT CAP on 06/12/2020 did not show any evidence of nodal metastasis or metastatic disease in the chest.  Right adrenal mass measures 4.2 x 3.8 cm exerts mass-effect upon the posterior and right lateral wall of the IVC.  Right kidney masses measuring 2.3 x 2.3 cm and 1.9 x 1.7 cm suspicious for RCC.  Upper pole of the left kidney lesion 2.2 x 2.0 cm and medial cortex of the inferior pole measuring 2 x 2 cm and another interpolar left kidney lesion measuring 1.3 x 1.5 cm. - Bone scan on 06/13/2020 was negative.  2.   Social/family history: - He lives at home with his daughter.  He walks with a cane. - He mows lawn with a riding mower.  He has a garden and gross vegetables.  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  Quit smoking 25 years ago. - No family history of malignancies that he is aware of.   PLAN:  1.  Bilateral renal cell carcinoma with metastasis to right adrenal gland: -  We reviewed results of the CT CAP which have shown at least 5 lesions suspicious for RCC in both kidneys.  There is also a right adrenal mass. - Bone scan did not show any osseous metastasis. - I have recommended biopsy of the right renal mass. - We have reviewed his PSA which was elevated at 10.21.  LDH is 189. - He had a history of prostate cancer treated with seed implants many years ago. - RTC 5 days after the biopsy.  2.  Right-sided back pain: - He reports pain on and off for the last 2 years.  It varies from dull to sharp and mostly in the mornings lasting for about 2 to 3 hours. - Continue tramadol 50 mg twice daily as needed. - Continue stool softener.   Orders placed this encounter:  Orders Placed This Encounter  Procedures  . CT Biopsy     Derek Jack, MD [place] [phone]   I, Milinda Antis, am acting as a scribe for Dr. Sanda Linger.  I, Derek Jack MD, have reviewed the above documentation for accuracy and completeness, and I agree with the above.

## 2020-06-17 NOTE — Patient Instructions (Addendum)
[place] at [facility] Discharge Instructions  You were seen today by Dr. Delton Coombes. He went over your recent results and scans. You will be sent to [facility] to have a biopsy to confirm the diagnosis. STOP taking the Xarelto 2 days prior to your biopsy appointment. Dr. Delton Coombes will see you back after the biopsy results for follow up.   Thank you for choosing [place] at [facility] to provide your oncology and hematology care.  To afford each patient quality time with our provider, please arrive at least 15 minutes before your scheduled appointment time.   If you have a lab appointment with the [facility] please come in thru the Main Entrance and check in at the main information desk  You need to re-schedule your appointment should you arrive 10 or more minutes late.  We strive to give you quality time with our providers, and arriving late affects you and other patients whose appointments are after yours.  Also, if you no show three or more times for appointments you may be dismissed from the clinic at the providers discretion.     Again, thank you for choosing [facility].  Our hope is that these requests will decrease the amount of time that you wait before being seen by our physicians.       _____________________________________________________________  Should you have questions after your visit to [facility], please contact our office at [phone] between the hours of 8:00 a.m. and 4:30 p.m.  Voicemails left after 4:00 p.m. will not be returned until the following business day.  For prescription refill requests, have your pharmacy contact our office and allow 72 hours.    Cancer Center Support Programs:   > Cancer Support Group  2nd Tuesday of the month 1pm-2pm, Journey Room

## 2020-06-20 ENCOUNTER — Encounter (HOSPITAL_COMMUNITY): Payer: Self-pay

## 2020-06-20 ENCOUNTER — Telehealth (HOSPITAL_COMMUNITY): Payer: Self-pay

## 2020-06-20 NOTE — Progress Notes (Signed)
[facility]            Derrick Long Male, 87 y.o., 1926/12/20  MRN:  [id number] Phone:  [phone] (H)       PCP:  [facility]:  [facility] ([facility]) 06/26/2020 at 11:00 AM           RE: Biopsy Received: Yesterday  Message Details  Suttle, Rosanne Ashing, MD  [facility] E Approved for CT guided right adrenal mass biopsy.   Dylan    Previous Messages  ----- Message -----  From: Lenore Cordia  Sent: 06/19/2020  3:17 PM EDT  To: [facility] Procedure Requests  Subject: Biopsy                      Procedure Requested: CT Biopsy    Reason for Procedure: mass on right adrenal gland    Provider Requesting: Dr Delton Coombes   Provider Telephone: [phone]    Other Info:

## 2020-06-25 ENCOUNTER — Other Ambulatory Visit: Payer: Self-pay | Admitting: Student

## 2020-06-26 ENCOUNTER — Encounter (HOSPITAL_COMMUNITY): Payer: Self-pay

## 2020-06-26 ENCOUNTER — Ambulatory Visit (HOSPITAL_COMMUNITY)
Admission: RE | Admit: 2020-06-26 | Discharge: 2020-06-26 | Disposition: A | Discharge status: Home / Self Care | Payer: Medicare Other | Source: Ambulatory Visit | Attending: Hematology | Admitting: Hematology

## 2020-06-26 ENCOUNTER — Other Ambulatory Visit: Payer: Self-pay

## 2020-06-26 DIAGNOSIS — E278 Other specified disorders of adrenal gland: Secondary | ICD-10-CM | POA: Diagnosis present

## 2020-06-26 DIAGNOSIS — Z7901 Long term (current) use of anticoagulants: Secondary | ICD-10-CM | POA: Insufficient documentation

## 2020-06-26 DIAGNOSIS — Z87891 Personal history of nicotine dependence: Secondary | ICD-10-CM | POA: Insufficient documentation

## 2020-06-26 DIAGNOSIS — Z79899 Other long term (current) drug therapy: Secondary | ICD-10-CM | POA: Insufficient documentation

## 2020-06-26 DIAGNOSIS — I10 Essential (primary) hypertension: Secondary | ICD-10-CM | POA: Insufficient documentation

## 2020-06-26 LAB — PROTIME-INR
INR: 1.2 (ref 0.8–1.2)
Prothrombin Time: 14.7 seconds (ref 11.4–15.2)

## 2020-06-26 LAB — CBC
HCT: 39.2 % (ref 39.0–52.0)
Hemoglobin: 13.3 g/dL (ref 13.0–17.0)
MCH: 33 pg (ref 26.0–34.0)
MCHC: 33.9 g/dL (ref 30.0–36.0)
MCV: 97.3 fL (ref 80.0–100.0)
Platelets: 201 10*3/uL (ref 150–400)
RBC: 4.03 MIL/uL — ABNORMAL LOW (ref 4.22–5.81)
RDW: 13 % (ref 11.5–15.5)
WBC: 4.3 10*3/uL (ref 4.0–10.5)
nRBC: 0 % (ref 0.0–0.2)

## 2020-06-26 MED ORDER — FENTANYL CITRATE (PF) 100 MCG/2ML IJ SOLN
INTRAMUSCULAR | Status: AC | PRN
  Administered 2020-06-26: 25 ug via INTRAVENOUS

## 2020-06-26 MED ORDER — LIDOCAINE-EPINEPHRINE 1 %-1:100000 IJ SOLN
INTRAMUSCULAR | Status: AC | PRN
  Administered 2020-06-26: 10 mL

## 2020-06-26 MED ORDER — MIDAZOLAM HCL 2 MG/2ML IJ SOLN
INTRAMUSCULAR | Status: AC | PRN
  Administered 2020-06-26: 1 mg via INTRAVENOUS

## 2020-06-26 MED ORDER — FENTANYL CITRATE (PF) 100 MCG/2ML IJ SOLN
INTRAMUSCULAR | Status: AC
  Filled 2020-06-26: qty 2

## 2020-06-26 MED ORDER — SODIUM CHLORIDE 0.9 % IV SOLN: INTRAVENOUS | Status: DC

## 2020-06-26 MED ORDER — MIDAZOLAM HCL 2 MG/2ML IJ SOLN
INTRAMUSCULAR | Status: AC
  Filled 2020-06-26: qty 2

## 2020-06-26 MED ORDER — LIDOCAINE-EPINEPHRINE 1 %-1:100000 IJ SOLN
INTRAMUSCULAR | Status: AC
  Filled 2020-06-26: qty 1

## 2020-06-26 NOTE — Discharge Instructions (Signed)

## 2020-06-26 NOTE — H&P (Signed)
Chief Complaint: Patient was seen in consultation today for right adrenal mass  Referring Physician(s): Katragadda,Sreedhar  Supervising Physician: Sandi Mariscal  Patient Status: [facility] - Out-pt  History of Present Illness: Derrick Long is a 87 y.o. male with past medical history of HLD, HTN, prostate cancer, now with concern for metastatic right renal cell carcinoma based on recent CT imaging.  Patient referred to IR for right adrenal mass biopsy at the request of Dr. Delton Coombes.   Case reviewed and approved by Dr. Serafina Royals.    Patient presents to Radiology today in his usual state of health.  He is alert and oriented.  Reports back pain.  He understands goals of the procedure today and after lengthy discussion desires to proceed.  He has been NPO.  He has held his Xarelto.   Past Medical History:  Diagnosis Date  . Acid reflux   . Anemia   . Ex-cigarette smoker   . Gout   . Hyperlipemia   . Hypertension   . Paroxysmal atrial fibrillation ([place]) 2014  . Prostate cancer ([place]) 6.23.2015   SEEDS 20 YEARS AGO  . Prostate disease     Past Surgical History:  Procedure Laterality Date  . LEFT HEART CATHETERIZATION WITH CORONARY ANGIOGRAM N/A 07/21/2012   Procedure: LEFT HEART CATHETERIZATION WITH CORONARY ANGIOGRAM;  Surgeon: Troy Sine, MD;  Location: [facility] CATH LAB;  Service: Cardiovascular;  Laterality: N/A;  . PACEMAKER INSERTION    . PERMANENT PACEMAKER INSERTION N/A 07/22/2012   Procedure: PERMANENT PACEMAKER INSERTION;  Surgeon: Sanda Klein, MD;  Location: [place] CATH LAB;  Service: Cardiovascular;  Laterality: N/A;  . PROSTATE SURGERY      Allergies: Patient has no known allergies.  Medications: Prior to Admission medications   Medication Sig Start Date End Date Taking? Authorizing Provider  amLODipine (NORVASC) 10 MG tablet Take 10 mg by mouth daily.   Yes [provider]  atorvastatin (LIPITOR) 20 MG tablet Take 1 tablet (20 mg total) by mouth daily at 6  PM. Patient taking differently: Take 20 mg by mouth at bedtime. 07/23/12  Yes Rosita Fire, Brittainy M, PA-C  carvedilol (COREG) 25 MG tablet Take 50 mg by mouth 2 (two) times daily with a meal.   Yes [provider]  Cholecalciferol 25 MCG (1000 UT) tablet Take 1,000 Units by mouth daily.   Yes [provider]  colchicine 0.6 MG tablet Take 1 tablet (0.6 mg total) by mouth 2 (two) times daily as needed (gout). Patient taking differently: Take 0.6-1.2 mg by mouth See admin instructions. Take 1.2 mg at onset of gout flare, may take a second 0.6 mg dose 1 hour later as needed for gout 02/18/17  Yes Harris, Abigail, PA-C  Ensure (ENSURE) Take 237 mLs by mouth daily as needed (nutrition).   Yes [provider]  ferrous sulfate 324 MG TBEC Take 324 mg by mouth 2 (two) times daily.   Yes [provider]  HYDROcodone-acetaminophen (NORCO/VICODIN) 5-325 MG tablet Take 1 tablet by mouth every 6 (six) hours as needed for moderate pain. 05/03/20  Yes Milton Ferguson, MD  omeprazole (PRILOSEC) 20 MG capsule Take 20 mg by mouth daily as needed (acid reflux).   Yes [provider]  potassium chloride SA (KLOR-CON) 20 MEQ tablet Take 20 mEq by mouth daily. 02/28/20  Yes [provider]  rivaroxaban (XARELTO) 20 MG TABS tablet Take 1 tablet (20 mg total) by mouth daily with supper. 10/30/13  Yes Croitoru, Dani Gobble, MD  tamsulosin [facility])  0.4 MG CAPS capsule Take 0.8 mg by mouth at bedtime.   Yes [provider]  diclofenac sodium (VOLTAREN) 1 % GEL Apply 2 g topically 4 (four) times daily. Patient taking differently: Apply 2 g topically 4 (four) times daily as needed (pain). 06/19/17   Rancour, Annie Main, MD  fluticasone (FLONASE) 50 MCG/ACT nasal spray Place 2 sprays into both nostrils daily. Patient taking differently: Place 2 sprays into both nostrils daily as needed for allergies. 02/15/16   Ward, Delice Bison, DO  traMADol (ULTRAM) 50 MG tablet Take 1 tablet (50 mg  total) by mouth every 12 (twelve) hours as needed for moderate pain or severe pain. 06/06/20   Derek Jack, MD     History reviewed. No pertinent family history.  Social History   Socioeconomic History  . Marital status: Married    Spouse name: Not on file  . Number of children: Not on file  . Years of education: Not on file  . Highest education level: Not on file  Occupational History  . Not on file  Tobacco Use  . Smoking status: Former Smoker    Quit date: 07/20/2002    Years since quitting: 17.9  . Smokeless tobacco: Never Used  Vaping Use  . Vaping Use: Never used  Substance and Sexual Activity  . Alcohol use: No  . Drug use: No  . Sexual activity: Not on file  Other Topics Concern  . Not on file  Social History Narrative  . Not on file   Social Determinants of Health   Financial Resource Strain: Low Risk   . Difficulty of Paying Living Expenses: Not hard at all  Food Insecurity: No Food Insecurity  . Worried About Charity fundraiser in the Last Year: Never true  . Ran Out of Food in the Last Year: Never true  Transportation Needs: No Transportation Needs  . Lack of Transportation (Medical): No  . Lack of Transportation (Non-Medical): No  Physical Activity: Insufficiently Active  . Days of Exercise per Week: 3 days  . Minutes of Exercise per Session: 30 min  Stress: No Stress Concern Present  . Feeling of Stress : Not at all  Social Connections: Moderately Integrated  . Frequency of Communication with Friends and Family: More than three times a week  . Frequency of Social Gatherings with Friends and Family: More than three times a week  . Attends Religious Services: More than 4 times per year  . Active Member of Clubs or Organizations: No  . Attends Archivist Meetings: 1 to 4 times per year  . Marital Status: Widowed     Review of Systems: A 12 point ROS discussed and pertinent positives are indicated in the HPI above.  All other systems  are negative.  Review of Systems  Constitutional: Negative for fatigue and fever.  Respiratory: Negative for cough and shortness of breath.   Cardiovascular: Negative for chest pain.  Gastrointestinal: Negative for abdominal pain, nausea and vomiting.  Genitourinary: Negative for dysuria.  Musculoskeletal: Positive for back pain.  Psychiatric/Behavioral: Negative for behavioral problems and confusion.    Vital Signs: BP (!) 169/82   Pulse 61   Temp (!) 97.5 F (36.4 C) (Oral)   Ht 6\' 1"  (1.854 m)   Wt 193 lb (87.5 kg)   SpO2 100%   BMI 25.46 kg/m   Physical Exam Vitals and nursing note reviewed.  Constitutional:      General: He is not in acute distress.  Appearance: Normal appearance. He is not ill-appearing.  HENT:     Mouth/Throat:     Mouth: Mucous membranes are moist.     Pharynx: Oropharynx is clear.  Cardiovascular:     Rate and Rhythm: Normal rate and regular rhythm.  Pulmonary:     Effort: Pulmonary effort is normal.     Breath sounds: Normal breath sounds.  Skin:    General: Skin is warm and dry.  Neurological:     General: No focal deficit present.     Mental Status: He is alert and oriented to person, place, and time. Mental status is at baseline.  Psychiatric:        Mood and Affect: Mood normal.        Behavior: Behavior normal.        Thought Content: Thought content normal.        Judgment: Judgment normal.      MD Evaluation Airway: WNL Heart: WNL Abdomen: WNL Chest/ Lungs: WNL ASA  Classification: 3 Mallampati/Airway Score: One   Imaging: NM Bone Scan Whole Body  Result Date: 06/13/2020 CLINICAL DATA:  Staging metastatic renal cell carcinoma EXAM: NUCLEAR MEDICINE WHOLE BODY BONE SCAN TECHNIQUE: Whole body anterior and posterior images were obtained approximately 3 hours after intravenous injection of radiopharmaceutical. RADIOPHARMACEUTICALS:  21.0 mCi Technetium-9m MDP IV COMPARISON:  CT, chest abdomen and pelvis from 06/12/2020.  FINDINGS: Inadvertent arterial injection of the radiopharmaceutical noted with radiotracer localization in the right hand. Normal physiologic tracer activity identified within the kidneys an urinary bladder. No abnormal foci of increased radiotracer uptake within the axial and appendicular skeleton to suggest bone metastases. There is a mild scoliosis deformity involving the thoracolumbar spine with degenerative changes noted in the lumbar spine. Degenerative type changes are also noted within both knees as well as the left first MTP joint. IMPRESSION: No findings to suggest osseous metastasis. Electronically Signed   By: Kerby Moors M.D.   On: 06/13/2020 14:26   CT CHEST ABDOMEN PELVIS W CONTRAST  Result Date: 06/12/2020 CLINICAL DATA:  Metastatic renal cell carcinoma.  Staging workup. EXAM: CT CHEST, ABDOMEN, AND PELVIS WITH CONTRAST TECHNIQUE: Multidetector CT imaging of the chest, abdomen and pelvis was performed following the standard protocol during bolus administration of intravenous contrast. CONTRAST:  139mL OMNIPAQUE IOHEXOL 300 MG/ML  SOLN COMPARISON:  09/15/2019 FINDINGS: CT CHEST FINDINGS Cardiovascular: Ascending thoracic aortic aneurysm measures 4.5 cm in maximum diameter, image 58/3. Aortic atherosclerosis. Coronary artery atherosclerotic calcifications. Left chest wall pacer device is identified with leads in the right atrial appendage and right ventricle. Mediastinum/Nodes: No enlarged mediastinal, hilar, or axillary lymph nodes. Thyroid gland, trachea, and esophagus demonstrate no significant findings. Calcified mediastinal and hilar lymph nodes compatible with prior granulomatous disease. Lungs/Pleura: No pleural effusion. No airspace consolidation, atelectasis or pneumothorax. 4 mm peripheral nodule in the left lower lobe is unchanged from previous imaging and likely represents a benign abnormality, image 72/1. Musculoskeletal: Degenerative disc disease identified within the lower  thoracic spine. No acute or suspicious osseous findings. CT ABDOMEN PELVIS FINDINGS Hepatobiliary: No suspicious liver abnormality. Previous cholecystectomy with chronic mild increase caliber of the CBD and intrahepatic bile ducts. Pancreas: Unremarkable. No pancreatic ductal dilatation or surrounding inflammatory changes. Spleen: Normal in size without focal abnormality. Adrenals/Urinary Tract: Recently identified right adrenal mass is again noted. This measures 4.2 x 3.8 cm, image 18/3. This exerts mass effect upon the posterior and right lateral wall of the IVC. Bilateral enhancing renal neoplasms are identified: -arising off the  lateral cortex of the inferior pole of right kidney is a 2.3 x 2.3 cm kidney mass, image 134/3. Arising off the inferior aspect of the right kidney is a 1.9 x 1.7 cm complex lesion, also suspicious for renal cell carcinoma. Arising off the upper pole of the left kidney is 2.2 x 2.0 cm solid-appearing lesion, image 108/3. Arising off the medial cortex of the inferior pole of left kidney is a 2.0 x 2.0 cm lesion, image 136/3. Arising from the posterior and medial cortex of the interpolar left kidney is a 1.3 x 1.5 cm lesion, image 108/3. Several low-attenuation lesions are noted within both kidneys and are favored to represent simple cysts. The largest arises from the anterior cortex of the right mid kidney measuring 1.9 by 1.4 cm, image 132/3. Lastly, there several subcentimeter low-density kidney lesions which are technically too small to reliably characterize. No hydronephrosis identified bilaterally. Urinary bladder is unremarkable. Stomach/Bowel: Stomach is within normal limits. Status post right hemicolectomy with enterocolonic anastomosis. No evidence of bowel wall thickening, distention, or inflammatory changes. Vascular/Lymphatic: Aortic atherosclerosis. No aneurysm. No abdominopelvic adenopathy. Reproductive: Prostate gland is enlarged and contains multiple seed implants. Other:  No free fluid or fluid collections. Musculoskeletal: No acute or suspicious osseous findings. Multilevel lumbar degenerative disc disease. Marked right hip osteoarthritis. IMPRESSION: 1. Bilateral enhancing renal neoplasms are identified compatible with multifocal renal cell carcinomas. 2. Right adrenal mass is again noted compatible with metastatic disease. 3. No evidence for nodal metastasis or metastatic disease to the chest. 4. Aortic atherosclerosis and coronary artery atherosclerotic calcifications. 5. Enlarged prostate gland with multiple seed implants. 6. Aortic atherosclerosis. 7. Ascending thoracic aortic aneurysm is noted measuring 4.5 cm. Ascending thoracic aortic aneurysm. Recommend semi-annual imaging followup by CTA or MRA and referral to cardiothoracic surgery if not already obtained. This recommendation follows 2010 ACCF/AHA/AATS/ACR/ASA/SCA/SCAI/SIR/STS/SVM Guidelines for the Diagnosis and Management of Patients With Thoracic Aortic Disease. Circulation. 2010; 121: V956-L875. Aortic aneurysm NOS (ICD10-I71.9) Aortic Atherosclerosis (ICD10-I70.0). Electronically Signed   By: Kerby Moors M.D.   On: 06/12/2020 15:18    Labs:  CBC: Recent Labs    09/18/19 0634 05/03/20 0945 06/06/20 0902 06/26/20 0835  WBC 8.1 3.9* 4.3 4.3  HGB 10.1* 12.3* 13.5 13.3  HCT 31.6* 36.8* 40.8 39.2  PLT 142* 183 186 201    COAGS: Recent Labs    09/16/19 0853 06/26/20 0835  INR 1.4* 1.2  APTT 31  --     BMP: Recent Labs    09/15/19 2240 09/16/19 0853 09/17/19 0559 09/18/19 0634 05/03/20 0945 06/06/20 0902  NA 134* 133* 137 138 136 136  K 3.2* 3.2* 3.8 3.9 3.5 4.0  CL 103 104 109 111 103 105  CO2 20* 20* 19* 21* 25 25  GLUCOSE 119* 90 87 92 137* 101*  BUN 18 16 18 17  26* 20  CALCIUM 8.5* 8.0* 8.4* 8.3* 8.9 9.1  CREATININE 1.50* 1.49* 1.63* 1.62* 1.39* 1.17  GFRNONAA 40* 40* 36* 37* 48* 58*  GFRAA 47* 47* 42* 42*  --   --     LIVER FUNCTION TESTS: Recent Labs     09/17/19 0559 09/18/19 0634 05/03/20 0945 06/06/20 0902  BILITOT 0.8 0.9 0.8 1.1  AST 33 34 19 22  ALT 27 23 12 18   ALKPHOS 57 50 72 71  PROT 6.2* 6.5 7.2 7.6  ALBUMIN 3.0* 3.0* 3.4* 3.7    TUMOR MARKERS: No results for input(s): AFPTM, CEA, CA199, CHROMGRNA in the last 8760 hours.  Assessment  and Plan: Patient with past medical history of HTN, HLD presents with complaint of right adrenal mass, bilateral renal masses.  IR consulted for adrenal mass biopsy at the request of Dr. Delton Coombes. Case reviewed by Dr. Serafina Royals who approves patient for procedure.  Patient presents today in their usual state of health.  He has been NPO and is not currently on blood thinners as he reports he held his Xarelto as instructed.  Risks and benefits was discussed with the patient and/or patient's family including, but not limited to bleeding, infection, damage to adjacent structures or low yield requiring additional tests.  All of the questions were answered and there is agreement to proceed.  Consent signed and in chart.  Thank you for this interesting consult.  I greatly enjoyed meeting Derrick Long and look forward to participating in their care.  A copy of this report was sent to the requesting provider on this date.  Electronically Signed: Docia Barrier, PA 06/26/2020, 11:17 AM   I spent a total of  30 Minutes   in face to face in clinical consultation, greater than 50% of which was counseling/coordinating care for right adrenal mass biopsy.

## 2020-06-26 NOTE — Procedures (Signed)
Pre procedural Dx: Right adrenal gland metastasis  Post procedural Dx: Same  Technically successful CT guided biopsy of right adrenal gland metastasis.   EBL: None.   Complications: None immediate.   Ronny Bacon, MD Pager #: [phone]

## 2020-06-26 NOTE — Progress Notes (Signed)
Pt ambulated without difficulty or bleeding.   Discharged home with daughter who will drive and stay with pt x 24 hrs 

## 2020-06-28 LAB — SURGICAL PATHOLOGY

## 2020-07-09 NOTE — Progress Notes (Signed)
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    CANCER STAGING: Cancer Staging No matching staging information was found for the patient.  INTERVAL HISTORY:  [name], a 87 y.o. 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 [name] was last seen on 06/17/2020.   Today he reports feeling well. He reported to the ED for back pain; that pain is still present and unchanged. He denies any drowsiness, constipation, or confusion. He has normal levels of activity at home. He is taking tramadol 1x daily.   RTC REVIEW OF SYSTEMS:  Review of Systems  Constitutional: Positive for appetite change (75%) and fatigue (75%).  Gastrointestinal: Negative for constipation.  Genitourinary: Positive for difficulty urinating.   Musculoskeletal: Positive for back pain (lower; 5/10).  Psychiatric/Behavioral: Negative for confusion.  All other systems reviewed and are negative.   PAST MEDICAL/SURGICAL HISTORY:  Past Medical History:  Diagnosis Date  . Acid reflux   . Anemia   . Ex-cigarette smoker   . Gout   . Hyperlipemia   . Hypertension   . Paroxysmal atrial fibrillation ([facility]) 2014  . Prostate cancer ([facility]) 6.23.2015   SEEDS 20 YEARS AGO  . Prostate disease    Past Surgical History:  Procedure Laterality Date  . LEFT HEART CATHETERIZATION WITH CORONARY ANGIOGRAM N/A 07/21/2012   Procedure: LEFT HEART CATHETERIZATION WITH CORONARY ANGIOGRAM;  Surgeon: Troy Sine, MD;  Location: [facility] CATH LAB;  Service: Cardiovascular;   Laterality: N/A;  . PACEMAKER INSERTION    . PERMANENT PACEMAKER INSERTION N/A 07/22/2012   Procedure: PERMANENT PACEMAKER INSERTION;  Surgeon: Sanda Klein, MD;  Location: [facility] CATH LAB;  Service: Cardiovascular;  Laterality: N/A;  . PROSTATE SURGERY      SOCIAL HISTORY:  Social History   Socioeconomic History  . Marital status: Married    Spouse name: Not on file  . Number of children: Not on file  . Years of education: Not on file  . Highest education level: Not on file  Occupational History  . Not on file  Tobacco Use  . Smoking status: Former Smoker    Quit date: 07/20/2002    Years since quitting: 17.9  . Smokeless tobacco: Never Used  Vaping Use  . Vaping Use: Never used  Substance and Sexual Activity  . Alcohol use: No  . Drug use: No  . Sexual activity: Not on file  Other Topics Concern  . Not on file  Social History Narrative  . Not on file   Social Determinants of Health   Financial Resource Strain: Low Risk   . Difficulty of Paying Living Expenses: Not hard at all  Food Insecurity: No Food Insecurity  . Worried About Charity fundraiser in the Last Year: Never true  . Ran Out of Food in the Last Year: Never true  Transportation Needs: No Transportation Needs  . Lack of Transportation (Medical): No  . Lack of Transportation (Non-Medical): No  Physical Activity: Insufficiently Active  . Days of Exercise per Week: 3 days  . Minutes of Exercise  per Session: 30 min  Stress: No Stress Concern Present  . Feeling of Stress : Not at all  Social Connections: Moderately Integrated  . Frequency of Communication with Friends and Family: More than three times a week  . Frequency of Social Gatherings with Friends and Family: More than three times a week  . Attends Religious Services: More than 4 times per year  . Active Member of Clubs or Organizations: No  . Attends Archivist Meetings: 1 to 4 times per year  . Marital Status: Widowed  Intimate Partner  Violence: Not At Risk  . Fear of Current or Ex-Partner: No  . Emotionally Abused: No  . Physically Abused: No  . Sexually Abused: No    FAMILY HISTORY:  No family history on file.  CURRENT MEDICATIONS:  Current Outpatient Medications  Medication Sig Dispense Refill  . amLODipine (NORVASC) 10 MG tablet Take 10 mg by mouth daily.    Marland Kitchen atorvastatin (LIPITOR) 20 MG tablet Take 1 tablet (20 mg total) by mouth daily at 6 PM. (Patient taking differently: Take 20 mg by mouth at bedtime.) 30 tablet 5  . carvedilol (COREG) 25 MG tablet Take 50 mg by mouth 2 (two) times daily with a meal.    . Cholecalciferol 25 MCG (1000 UT) tablet Take 1,000 Units by mouth daily.    . colchicine 0.6 MG tablet Take 1 tablet (0.6 mg total) by mouth 2 (two) times daily as needed (gout). (Patient taking differently: Take 0.6-1.2 mg by mouth See admin instructions. Take 1.2 mg at onset of gout flare, may take a second 0.6 mg dose 1 hour later as needed for gout) 30 tablet 0  . diclofenac sodium (VOLTAREN) 1 % GEL Apply 2 g topically 4 (four) times daily. (Patient taking differently: Apply 2 g topically 4 (four) times daily as needed (pain).) 100 g 0  . Ensure (ENSURE) Take 237 mLs by mouth daily as needed (nutrition).    . ferrous sulfate 324 MG TBEC Take 324 mg by mouth 2 (two) times daily.    . fluticasone (FLONASE) 50 MCG/ACT nasal spray Place 2 sprays into both nostrils daily. (Patient taking differently: Place 2 sprays into both nostrils daily as needed for allergies.) 16 g 2  . HYDROcodone-acetaminophen (NORCO/VICODIN) 5-325 MG tablet Take 1 tablet by mouth every 6 (six) hours as needed for moderate pain. 20 tablet 0  . omeprazole (PRILOSEC) 20 MG capsule Take 20 mg by mouth daily as needed (acid reflux).    . potassium chloride SA (KLOR-CON) 20 MEQ tablet Take 20 mEq by mouth daily.    . rivaroxaban (XARELTO) 20 MG TABS tablet Take 1 tablet (20 mg total) by mouth daily with supper. 20 tablet 0  . tamsulosin  (FLOMAX) 0.4 MG CAPS capsule Take 0.8 mg by mouth at bedtime.    . traMADol (ULTRAM) 50 MG tablet Take 1 tablet (50 mg total) by mouth every 12 (twelve) hours as needed for moderate pain or severe pain. 30 tablet 0   No current facility-administered medications for this visit.    ALLERGIES:  No Known Allergies  PHYSICAL EXAM:  Performance status (ECOG): 1 - Symptomatic but completely ambulatory  There were no vitals filed for this visit. Wt Readings from Last 3 Encounters:  06/26/20 193 lb (87.5 kg)  06/17/20 191 lb 6.4 oz (86.8 kg)  06/06/20 192 lb 1.6 oz (87.1 kg)   Physical Exam Vitals reviewed.  Constitutional:      Appearance: Normal appearance.  Cardiovascular:     Rate and Rhythm: Normal rate and regular rhythm.     Pulses: Normal pulses.     Heart sounds: Normal heart sounds.  Pulmonary:     Effort: Pulmonary effort is normal.     Breath sounds: Normal breath sounds.  Neurological:     General: No focal deficit present.     Mental Status: He is alert and oriented to person, place, and time.  Psychiatric:        Mood and Affect: Mood normal.        Behavior: Behavior normal.      LABORATORY DATA:  I have reviewed the labs as listed.  CBC Latest Ref Rng & Units 06/26/2020 06/06/2020 05/03/2020  WBC 4.0 - 10.5 K/uL 4.3 4.3 3.9(L)  Hemoglobin 13.0 - 17.0 g/dL 13.3 13.5 12.3(L)  Hematocrit 39.0 - 52.0 % 39.2 40.8 36.8(L)  Platelets 150 - 400 K/uL 201 186 183   CMP Latest Ref Rng & Units 06/06/2020 05/03/2020 09/18/2019  Glucose 70 - 99 mg/dL 101(H) 137(H) 92  BUN 8 - 23 mg/dL 20 26(H) 17  Creatinine 0.61 - 1.24 mg/dL 1.17 1.39(H) 1.62(H)  Sodium 135 - 145 mmol/L 136 136 138  Potassium 3.5 - 5.1 mmol/L 4.0 3.5 3.9  Chloride 98 - 111 mmol/L 105 103 111  CO2 22 - 32 mmol/L 25 25 21(L)  Calcium 8.9 - 10.3 mg/dL 9.1 8.9 8.3(L)  Total Protein 6.5 - 8.1 g/dL 7.6 7.2 6.5  Total Bilirubin 0.3 - 1.2 mg/dL 1.1 0.8 0.9  Alkaline Phos 38 - 126 U/L 71 72 50  AST 15 - 41 U/L 22  19 34  ALT 0 - 44 U/L 18 12 23     DIAGNOSTIC IMAGING:  I have independently reviewed the scans and discussed with the patient. NM Bone Scan Whole Body  Result Date: 06/13/2020 CLINICAL DATA:  Staging metastatic renal cell carcinoma EXAM: NUCLEAR MEDICINE WHOLE BODY BONE SCAN TECHNIQUE: Whole body anterior and posterior images were obtained approximately 3 hours after intravenous injection of radiopharmaceutical. RADIOPHARMACEUTICALS:  21.0 mCi Technetium-58m MDP IV COMPARISON:  CT, chest abdomen and pelvis from 06/12/2020. FINDINGS: Inadvertent arterial injection of the radiopharmaceutical noted with radiotracer localization in the right hand. Normal physiologic tracer activity identified within the kidneys an urinary bladder. No abnormal foci of increased radiotracer uptake within the axial and appendicular skeleton to suggest bone metastases. There is a mild scoliosis deformity involving the thoracolumbar spine with degenerative changes noted in the lumbar spine. Degenerative type changes are also noted within both knees as well as the left first MTP joint. IMPRESSION: No findings to suggest osseous metastasis. Electronically Signed   By: Kerby Moors M.D.   On: 06/13/2020 14:26   CT CHEST ABDOMEN PELVIS W CONTRAST  Result Date: 06/12/2020 CLINICAL DATA:  Metastatic renal cell carcinoma.  Staging workup. EXAM: CT CHEST, ABDOMEN, AND PELVIS WITH CONTRAST TECHNIQUE: Multidetector CT imaging of the chest, abdomen and pelvis was performed following the standard protocol during bolus administration of intravenous contrast. CONTRAST:  176mL OMNIPAQUE IOHEXOL 300 MG/ML  SOLN COMPARISON:  09/15/2019 FINDINGS: CT CHEST FINDINGS Cardiovascular: Ascending thoracic aortic aneurysm measures 4.5 cm in maximum diameter, image 58/3. Aortic atherosclerosis. Coronary artery atherosclerotic calcifications. Left chest wall pacer device is identified with leads in the right atrial appendage and right ventricle.  Mediastinum/Nodes: No enlarged mediastinal, hilar, or axillary lymph nodes. Thyroid gland, trachea, and esophagus demonstrate no significant findings. Calcified mediastinal and hilar lymph nodes compatible with prior granulomatous disease.  Lungs/Pleura: No pleural effusion. No airspace consolidation, atelectasis or pneumothorax. 4 mm peripheral nodule in the left lower lobe is unchanged from previous imaging and likely represents a benign abnormality, image 72/1. Musculoskeletal: Degenerative disc disease identified within the lower thoracic spine. No acute or suspicious osseous findings. CT ABDOMEN PELVIS FINDINGS Hepatobiliary: No suspicious liver abnormality. Previous cholecystectomy with chronic mild increase caliber of the CBD and intrahepatic bile ducts. Pancreas: Unremarkable. No pancreatic ductal dilatation or surrounding inflammatory changes. Spleen: Normal in size without focal abnormality. Adrenals/Urinary Tract: Recently identified right adrenal mass is again noted. This measures 4.2 x 3.8 cm, image 18/3. This exerts mass effect upon the posterior and right lateral wall of the IVC. Bilateral enhancing renal neoplasms are identified: -arising off the lateral cortex of the inferior pole of right kidney is a 2.3 x 2.3 cm kidney mass, image 134/3. Arising off the inferior aspect of the right kidney is a 1.9 x 1.7 cm complex lesion, also suspicious for renal cell carcinoma. Arising off the upper pole of the left kidney is 2.2 x 2.0 cm solid-appearing lesion, image 108/3. Arising off the medial cortex of the inferior pole of left kidney is a 2.0 x 2.0 cm lesion, image 136/3. Arising from the posterior and medial cortex of the interpolar left kidney is a 1.3 x 1.5 cm lesion, image 108/3. Several low-attenuation lesions are noted within both kidneys and are favored to represent simple cysts. The largest arises from the anterior cortex of the right mid kidney measuring 1.9 by 1.4 cm, image 132/3. Lastly, there  several subcentimeter low-density kidney lesions which are technically too small to reliably characterize. No hydronephrosis identified bilaterally. Urinary bladder is unremarkable. Stomach/Bowel: Stomach is within normal limits. Status post right hemicolectomy with enterocolonic anastomosis. No evidence of bowel wall thickening, distention, or inflammatory changes. Vascular/Lymphatic: Aortic atherosclerosis. No aneurysm. No abdominopelvic adenopathy. Reproductive: Prostate gland is enlarged and contains multiple seed implants. Other: No free fluid or fluid collections. Musculoskeletal: No acute or suspicious osseous findings. Multilevel lumbar degenerative disc disease. Marked right hip osteoarthritis. IMPRESSION: 1. Bilateral enhancing renal neoplasms are identified compatible with multifocal renal cell carcinomas. 2. Right adrenal mass is again noted compatible with metastatic disease. 3. No evidence for nodal metastasis or metastatic disease to the chest. 4. Aortic atherosclerosis and coronary artery atherosclerotic calcifications. 5. Enlarged prostate gland with multiple seed implants. 6. Aortic atherosclerosis. 7. Ascending thoracic aortic aneurysm is noted measuring 4.5 cm. Ascending thoracic aortic aneurysm. Recommend semi-annual imaging followup by CTA or MRA and referral to cardiothoracic surgery if not already obtained. This recommendation follows 2010 ACCF/AHA/AATS/ACR/ASA/SCA/SCAI/SIR/STS/SVM Guidelines for the Diagnosis and Management of Patients With Thoracic Aortic Disease. Circulation. 2010; 121: G017-C944. Aortic aneurysm NOS (ICD10-I71.9) Aortic Atherosclerosis (ICD10-I70.0). Electronically Signed   By: Kerby Moors M.D.   On: 06/12/2020 15:18   CT Biopsy  Result Date: 06/26/2020 INDICATION: Concern for metastatic renal cell carcinoma. Please perform CT-guided right adrenal mass biopsy for tissue diagnostic purposes. EXAM: CT GUIDED RIGHT ADRENAL MASS BIOPSY COMPARISON:  CT of the chest,  abdomen and pelvis-06/12/2020 MEDICATIONS: None. ANESTHESIA/SEDATION: Fentanyl 50 mcg IV; Versed 2 mg IV Sedation time: 20 minutes; The patient was continuously monitored during the procedure by the interventional radiology nurse under my direct supervision. CONTRAST:  None. COMPLICATIONS: None immediate. PROCEDURE: Informed consent was obtained from the patient following an explanation of the procedure, risks, benefits and alternatives. A time out was performed prior to the initiation of the procedure. The patient was initially positioned prone on  the CT table and a limited CT was performed for procedural planning demonstrating unchanged size and appearance of the at least 4.6 x 3.9 cm mass replacing the right adrenal gland (image 16, series 2). Given interposition of the base of the right lung, the patient was then positioned right lateral decubitus demonstrating a more amenable percutaneous window. The procedure was planned. The operative site was prepped and draped in the usual sterile fashion. Appropriate trajectory was confirmed with a 22 gauge spinal needle after the adjacent tissues were anesthetized with 1% Lidocaine with epinephrine. Under intermittent CT guidance, a 17 gauge coaxial needle was advanced into the peripheral aspect of the mass. Appropriate positioning was confirmed and 6 core needle biopsy samples were obtained with an 18 gauge core needle biopsy device. The co-axial needle was removed following administration of a Gel-Foam slurry and superficial hemostasis was achieved with manual compression. A limited postprocedural CT was negative for hemorrhage or additional complication. A dressing was placed. The patient tolerated the procedure well without immediate postprocedural complication. IMPRESSION: Technically successful CT guided core needle biopsy of right adrenal gland metastasis. Electronically Signed   By: Sandi Mariscal M.D.   On: 06/26/2020 14:06     ASSESSMENT:  1.  Metastatic  prostate cancer to the right adrenal gland: -Presentation to the ER with worsening right-sided back pain, which has been on and off for the last couple of years. -Some decrease in appetite with 6 pound weight loss in the last 6 months. No hematuria or other B symptoms. -CT renal study on 05/03/2020 showed new right adrenal mass with bilateral renal lesions. Indeterminate L2 vertebral bodies sclerotic lesion. Low-attenuation lesions in the kidneys measure up to 2.3 cm on the right and were similar to scan from July 2021. - CT CAP on 06/12/2020 did not show any evidence of nodal metastasis or metastatic disease in the chest.  Right adrenal mass measures 4.2 x 3.8 cm exerts mass-effect upon the posterior and right lateral wall of the IVC.  Right kidney masses measuring 2.3 x 2.3 cm and 1.9 x 1.7 cm suspicious for RCC.  Upper pole of the left kidney lesion 2.2 x 2.0 cm and medial cortex of the inferior pole measuring 2 x 2 cm and another interpolar left kidney lesion measuring 1.3 x 1.5 cm. - Bone scan on 06/13/2020 was negative. - Right adrenal biopsy consistent with prostatic adenocarcinoma  2. Social/family history: -He lives at home with his daughter. He walks with a cane. -He mows lawn with a riding mower. He has a garden and gross vegetables. 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 Quit smoking 25 years ago. -No family history of malignancies that he is aware of.   PLAN:  1.  Metastatic prostate cancer to the right adrenal gland: -We discussed the findings on the right adrenal mass biopsy on 06/26/2020. - Pathology consistent with metastatic carcinoma, positive for prostein and cytokeratin AE1/AE3. - His PSA was elevated at 10.2. - We discussed normal treatment plan for metastatic castration sensitive prostate cancer with antiandrogen therapy.  We will give him loading dose of degarelix to prevent flare.  This will be followed by Lupron every 6 months.  Because of his advanced  age, we did not consider adding novel antiandrogens. - We are also not considering further biopsies of the renal masses given the risk of bleeding and his advanced age.  This was explained to the patient and his daughter who acknowledged understanding. - We will obtain authorization for his  injections and start him on as soon as possible. - We will follow-up on his PSA level and also imaging of his adrenal and kidney lesions. - I will see him back in 1 month for follow-up.  2. Right-sided back pain: -  He is taking tramadol as needed which is helping. - I have sent tramadol prescription to both [facility] and  local pharmacy.   Orders placed this encounter:  No orders of the defined types were placed in this encounter.  Total time spent is 40 minutes with more than 50% of the time spent face-to-face discussing new diagnosis, prognosis, treatment plan, counseling and coordination of care.  Derek Jack, MD [place] [phone]   I, Thana Ates, am acting as a scribe for Dr. Derek Jack.  I, Derek Jack MD, have reviewed the above documentation for accuracy and completeness, and I agree with the above.

## 2020-07-10 ENCOUNTER — Other Ambulatory Visit: Payer: Self-pay

## 2020-07-10 ENCOUNTER — Inpatient Hospital Stay (HOSPITAL_BASED_OUTPATIENT_CLINIC_OR_DEPARTMENT_OTHER): Payer: Medicare Other | Admitting: Hematology

## 2020-07-10 VITALS — BP 148/72 | HR 64 | Temp 97.0°F | Resp 18 | Wt 186.9 lb

## 2020-07-10 DIAGNOSIS — C642 Malignant neoplasm of left kidney, except renal pelvis: Secondary | ICD-10-CM | POA: Diagnosis not present

## 2020-07-10 DIAGNOSIS — Z5111 Encounter for antineoplastic chemotherapy: Secondary | ICD-10-CM | POA: Diagnosis not present

## 2020-07-10 DIAGNOSIS — C641 Malignant neoplasm of right kidney, except renal pelvis: Secondary | ICD-10-CM | POA: Diagnosis not present

## 2020-07-10 DIAGNOSIS — E278 Other specified disorders of adrenal gland: Secondary | ICD-10-CM | POA: Diagnosis not present

## 2020-07-10 DIAGNOSIS — C61 Malignant neoplasm of prostate: Secondary | ICD-10-CM | POA: Insufficient documentation

## 2020-07-10 MED ORDER — TRAMADOL HCL 50 MG PO TABS: 50.0000 mg | ORAL_TABLET | Freq: Every day | ORAL | 0 refills | Status: DC | PRN

## 2020-07-10 NOTE — Patient Instructions (Signed)
[facility] at [facility] Discharge Instructions  You were seen today by Dr. Katragadda. He went over your recent results. Dr. Katragadda will see you back in 1 month for labs and follow up.   Thank you for choosing [facility] at [facility] to provide your oncology and hematology care.  To afford each patient quality time with our provider, please arrive at least 15 minutes before your scheduled appointment time.   If you have a lab appointment with the Cancer Center please come in thru the Main Entrance and check in at the main information desk  You need to re-schedule your appointment should you arrive 10 or more minutes late.  We strive to give you quality time with our providers, and arriving late affects you and other patients whose appointments are after yours.  Also, if you no show three or more times for appointments you may be dismissed from the clinic at the providers discretion.     Again, thank you for choosing [facility].  Our hope is that these requests will decrease the amount of time that you wait before being seen by our physicians.       _____________________________________________________________  Should you have questions after your visit to [facility], please contact our office at [phone] between the hours of 8:00 a.m. and 4:30 p.m.  Voicemails left after 4:00 p.m. will not be returned until the following business day.  For prescription refill requests, have your pharmacy contact our office and allow 72 hours.    Cancer Center Support Programs:   > Cancer Support Group  2nd Tuesday of the month 1pm-2pm, Journey Room   

## 2020-07-16 ENCOUNTER — Other Ambulatory Visit: Payer: Self-pay

## 2020-07-16 ENCOUNTER — Inpatient Hospital Stay (HOSPITAL_COMMUNITY): Payer: Medicare Other

## 2020-07-16 VITALS — BP 133/70 | HR 72 | Temp 98.0°F | Resp 19

## 2020-07-16 DIAGNOSIS — Z5111 Encounter for antineoplastic chemotherapy: Secondary | ICD-10-CM | POA: Diagnosis not present

## 2020-07-16 DIAGNOSIS — C61 Malignant neoplasm of prostate: Secondary | ICD-10-CM

## 2020-07-16 MED ORDER — DEGARELIX ACETATE(240 MG DOSE) 120 MG/VIAL ~~LOC~~ SOLR
240.0000 mg | Freq: Once | SUBCUTANEOUS | Status: AC
  Administered 2020-07-16: 240 mg via SUBCUTANEOUS
  Filled 2020-07-16: qty 6

## 2020-07-16 NOTE — Progress Notes (Signed)
Patient tolerated Firmagon injection with no complaints voiced.  Site clean and dry with no bruising or swelling noted.  No complaints of pain.  Discharged with vital signs stable and no signs or symptoms of distress noted.   

## 2020-08-06 ENCOUNTER — Inpatient Hospital Stay (HOSPITAL_COMMUNITY): Payer: Medicare Other | Attending: Hematology

## 2020-08-06 ENCOUNTER — Other Ambulatory Visit: Payer: Self-pay

## 2020-08-06 DIAGNOSIS — E278 Other specified disorders of adrenal gland: Secondary | ICD-10-CM

## 2020-08-06 DIAGNOSIS — C7971 Secondary malignant neoplasm of right adrenal gland: Secondary | ICD-10-CM | POA: Diagnosis not present

## 2020-08-06 DIAGNOSIS — C61 Malignant neoplasm of prostate: Secondary | ICD-10-CM | POA: Diagnosis not present

## 2020-08-06 DIAGNOSIS — M549 Dorsalgia, unspecified: Secondary | ICD-10-CM | POA: Diagnosis not present

## 2020-08-06 DIAGNOSIS — C641 Malignant neoplasm of right kidney, except renal pelvis: Secondary | ICD-10-CM

## 2020-08-06 LAB — CBC WITH DIFFERENTIAL/PLATELET
Abs Immature Granulocytes: 0.01 10*3/uL (ref 0.00–0.07)
Basophils Absolute: 0 10*3/uL (ref 0.0–0.1)
Basophils Relative: 1 %
Eosinophils Absolute: 0.1 10*3/uL (ref 0.0–0.5)
Eosinophils Relative: 3 %
HCT: 36.7 % — ABNORMAL LOW (ref 39.0–52.0)
Hemoglobin: 12.4 g/dL — ABNORMAL LOW (ref 13.0–17.0)
Immature Granulocytes: 0 %
Lymphocytes Relative: 40 %
Lymphs Abs: 1.5 10*3/uL (ref 0.7–4.0)
MCH: 33.1 pg (ref 26.0–34.0)
MCHC: 33.8 g/dL (ref 30.0–36.0)
MCV: 97.9 fL (ref 80.0–100.0)
Monocytes Absolute: 0.5 10*3/uL (ref 0.1–1.0)
Monocytes Relative: 12 %
Neutro Abs: 1.6 10*3/uL — ABNORMAL LOW (ref 1.7–7.7)
Neutrophils Relative %: 44 %
Platelets: 210 10*3/uL (ref 150–400)
RBC: 3.75 MIL/uL — ABNORMAL LOW (ref 4.22–5.81)
RDW: 12.8 % (ref 11.5–15.5)
WBC: 3.6 10*3/uL — ABNORMAL LOW (ref 4.0–10.5)
nRBC: 0 % (ref 0.0–0.2)

## 2020-08-06 LAB — LACTATE DEHYDROGENASE: LDH: 198 U/L — ABNORMAL HIGH (ref 98–192)

## 2020-08-06 LAB — COMPREHENSIVE METABOLIC PANEL
ALT: 24 U/L (ref 0–44)
AST: 29 U/L (ref 15–41)
Albumin: 3.9 g/dL (ref 3.5–5.0)
Alkaline Phosphatase: 69 U/L (ref 38–126)
Anion gap: 6 (ref 5–15)
BUN: 28 mg/dL — ABNORMAL HIGH (ref 8–23)
CO2: 24 mmol/L (ref 22–32)
Calcium: 9.2 mg/dL (ref 8.9–10.3)
Chloride: 103 mmol/L (ref 98–111)
Creatinine, Ser: 1.31 mg/dL — ABNORMAL HIGH (ref 0.61–1.24)
GFR, Estimated: 51 mL/min — ABNORMAL LOW (ref >60)
Glucose, Bld: 99 mg/dL (ref 70–99)
Potassium: 4.2 mmol/L (ref 3.5–5.1)
Sodium: 133 mmol/L — ABNORMAL LOW (ref 135–145)
Total Bilirubin: 0.9 mg/dL (ref 0.3–1.2)
Total Protein: 7.8 g/dL (ref 6.5–8.1)

## 2020-08-06 LAB — PSA: Prostatic Specific Antigen: 1.58 ng/mL (ref 0.00–4.00)

## 2020-08-13 ENCOUNTER — Inpatient Hospital Stay (HOSPITAL_BASED_OUTPATIENT_CLINIC_OR_DEPARTMENT_OTHER): Payer: Medicare Other | Admitting: Hematology and Oncology

## 2020-08-13 ENCOUNTER — Other Ambulatory Visit: Payer: Self-pay

## 2020-08-13 ENCOUNTER — Encounter (HOSPITAL_COMMUNITY): Payer: Self-pay | Admitting: Hematology and Oncology

## 2020-08-13 VITALS — BP 136/64 | HR 67 | Temp 96.9°F | Resp 19 | Wt 186.4 lb

## 2020-08-13 DIAGNOSIS — C61 Malignant neoplasm of prostate: Secondary | ICD-10-CM

## 2020-08-13 DIAGNOSIS — E278 Other specified disorders of adrenal gland: Secondary | ICD-10-CM | POA: Diagnosis not present

## 2020-08-13 DIAGNOSIS — C641 Malignant neoplasm of right kidney, except renal pelvis: Secondary | ICD-10-CM | POA: Diagnosis not present

## 2020-08-13 DIAGNOSIS — C642 Malignant neoplasm of left kidney, except renal pelvis: Secondary | ICD-10-CM | POA: Diagnosis not present

## 2020-08-13 MED ORDER — TRAMADOL HCL 50 MG PO TABS: 50.0000 mg | ORAL_TABLET | Freq: Every day | ORAL | 0 refills | Status: DC | PRN

## 2020-08-13 NOTE — Progress Notes (Signed)
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   BRIEF ONCOLOGIC HISTORY:  Oncology History   No history exists.    CANCER STAGING: Cancer Staging Prostate cancer [facility]) Staging form: Prostate, AJCC 8th Edition - Clinical stage from 07/10/2020: Stage IVB (cTX, cNX, pM1c, PSA: 10.2) - Unsigned  INTERVAL HISTORY:  Derrick Long, a 87 y.o. 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 Derrick Long was last seen on 07/10/2020.   On exam today Derrick Long reports that he has been well in Derrick interim since his last visit.  He reports that he has not been having any issues with low energy.  He reports his energy is "fair".  He notes that everything else is just about Derrick same.  He does endorse having some modest weight loss having decreased down about 5 pounds since early May.  He continues to have chronic back pain for which he is requesting a refill of his tramadol.  There is also some confusion as to which shots he previously received on 07/16/2020 in which shots are due today.  A full 10 point ROS is listed below.  RTC REVIEW OF SYSTEMS:  Review of Systems  Constitutional:  Positive for appetite change (75%) and fatigue (75%).  Gastrointestinal:  Negative for constipation.  Genitourinary:  Positive for difficulty urinating.   Musculoskeletal:  Positive for back pain (lower; 5/10).  Psychiatric/Behavioral:  Negative for confusion.   All other systems reviewed and are negative.  PAST MEDICAL/SURGICAL HISTORY:  Past Medical History:  Diagnosis Date   Acid reflux    Anemia    Ex-cigarette smoker     Gout    Hyperlipemia    Hypertension    Paroxysmal atrial fibrillation ([place]) 2014   Prostate cancer ([place]) 6.23.2015   SEEDS 20 YEARS AGO   Prostate disease    Past Surgical History:  Procedure Laterality Date   LEFT HEART CATHETERIZATION WITH CORONARY ANGIOGRAM N/A 07/21/2012   Procedure: LEFT HEART CATHETERIZATION WITH CORONARY ANGIOGRAM;  Surgeon: Troy Sine, MD;  Location: [facility] CATH LAB;  Service: Cardiovascular;  Laterality: N/A;   PACEMAKER INSERTION     PERMANENT PACEMAKER INSERTION N/A 07/22/2012   Procedure: PERMANENT PACEMAKER INSERTION;  Surgeon: Sanda Klein, MD;  Location: [place] CATH LAB;  Service: Cardiovascular;  Laterality: N/A;   PROSTATE SURGERY      SOCIAL HISTORY:  Social History   Socioeconomic History   Marital status: Married    Spouse name: Not on file   Number of children: Not on file   Years of education: Not on file   Highest education level: Not on file  Occupational History   Not on file  Tobacco Use   Smoking status: Former    Pack years: 0.00    Types: Cigarettes    Quit date: 07/20/2002    Years since quitting: 18.0   Smokeless tobacco: Never  Vaping Use   Vaping Use: Never used  Substance and Sexual Activity   Alcohol use: No   Drug use: No   Sexual activity: Not on file  Other Topics Concern   Not on file  Social History Narrative   Not on file   Social Determinants of Health   Financial Resource Strain: Low Risk    Difficulty of Paying Living Expenses: Not hard at all  Food Insecurity: No Food Insecurity   Worried About Charity fundraiser in [place] Last Year: Never true   Arboriculturist in [place] Last Year: Never true  Transportation Needs: No Transportation Needs   Lack of Transportation (Medical): No   Lack of Transportation (Non-Medical): No  Physical Activity: Insufficiently Active   Days of Exercise per Week: 3 days   Minutes of Exercise per Session: 30 min  Stress: No Stress Concern Present   Feeling of Stress : Not at all   Social Connections: Moderately Integrated   Frequency of Communication with Friends and Family: More than three times a week   Frequency of Social Gatherings with Friends and Family: More than three times a week   Attends Religious Services: More than 4 times per year   Active Member of Clubs or Organizations: No   Attends Archivist Meetings: 1 to 4 times per year   Marital Status: Widowed  Human resources officer Violence: Not At Risk   Fear of Current or Ex-Partner: No   Emotionally Abused: No   Physically Abused: No   Sexually Abused: No    FAMILY HISTORY:  History reviewed. No pertinent family history.  CURRENT MEDICATIONS:  Current Outpatient Medications  Medication Sig Dispense Refill   amLODipine (NORVASC) 10 MG tablet Take 10 mg by mouth daily.     atorvastatin (LIPITOR) 20 MG tablet Take 1 tablet (20 mg total) by mouth daily at 6 PM. (Long taking differently: Take 20 mg by mouth at bedtime.) 30 tablet 5   carvedilol (COREG) 25 MG tablet Take 50 mg by mouth 2 (two) times daily with a meal.     Cholecalciferol 25 MCG (1000 UT) tablet Take 1,000 Units by mouth daily.     colchicine 0.6 MG tablet Take 1 tablet (0.6 mg total) by mouth 2 (two) times daily as needed (gout). (Long taking differently: Take 0.6-1.2 mg by mouth See admin instructions. Take 1.2 mg at onset of gout flare, may take a second 0.6 mg dose 1 hour later as needed for gout) 30 tablet 0   diclofenac sodium (VOLTAREN) 1 % GEL Apply 2 g topically 4 (four) times daily. (Long taking differently: Apply 2 g topically 4 (four) times daily as needed (pain).) 100 g 0   Ensure (ENSURE) Take 237 mLs by mouth daily as needed (nutrition).     ferrous sulfate 324 MG TBEC Take 324 mg by mouth 2 (two) times daily.     fluticasone (FLONASE) 50 MCG/ACT nasal spray Place 2 sprays into both nostrils daily. (Long taking differently: Place 2 sprays into both nostrils daily as needed for allergies.) 16 g 2    HYDROcodone-acetaminophen (NORCO/VICODIN) 5-325 MG tablet Take 1 tablet by mouth every 6 (six) hours as needed for moderate pain. 20 tablet 0   omeprazole (PRILOSEC) 20 MG capsule Take 20 mg by mouth daily as needed (acid reflux).     potassium chloride SA (KLOR-CON) 20 MEQ tablet Take 20 mEq by mouth daily.     rivaroxaban (XARELTO) 20 MG TABS tablet Take 1 tablet (20 mg total) by mouth daily with supper. 20 tablet 0   tamsulosin (FLOMAX) 0.4 MG CAPS capsule Take 0.8 mg by mouth at bedtime.     traMADol (ULTRAM) 50 MG tablet Take 1  tablet (50 mg total) by mouth daily as needed for moderate pain or severe pain. 60 tablet 0   No current facility-administered medications for this visit.    ALLERGIES:  No Known Allergies  PHYSICAL EXAM:  Performance status (ECOG): 1 - Symptomatic but completely ambulatory  Vitals:   08/13/20 1502  BP: 136/64  Pulse: 67  Resp: 19  Temp: (!) 96.9 F (36.1 C)  SpO2: 99%   Wt Readings from Last 3 Encounters:  08/13/20 186 lb 6.4 oz (84.6 kg)  07/10/20 186 lb 14.4 oz (84.8 kg)  06/26/20 193 lb (87.5 kg)   Physical Exam Vitals reviewed.  Constitutional:      Appearance: Normal appearance.  Cardiovascular:     Rate and Rhythm: Normal rate and regular rhythm.     Pulses: Normal pulses.     Heart sounds: Normal heart sounds.  Pulmonary:     Effort: Pulmonary effort is normal.     Breath sounds: Normal breath sounds.  Neurological:     General: No focal deficit present.     Mental Status: He is alert and oriented to person, place, and time.  Psychiatric:        Mood and Affect: Mood normal.        Behavior: Behavior normal.     LABORATORY DATA:  I have reviewed Derrick labs as listed.  CBC Latest Ref Rng & Units 08/06/2020 06/26/2020 06/06/2020  WBC 4.0 - 10.5 K/uL 3.6(L) 4.3 4.3  Hemoglobin 13.0 - 17.0 g/dL 12.4(L) 13.3 13.5  Hematocrit 39.0 - 52.0 % 36.7(L) 39.2 40.8  Platelets 150 - 400 K/uL 210 201 186   CMP Latest Ref Rng & Units 08/06/2020  06/06/2020 05/03/2020  Glucose 70 - 99 mg/dL 99 101(H) 137(H)  BUN 8 - 23 mg/dL 28(H) 20 26(H)  Creatinine 0.61 - 1.24 mg/dL 1.31(H) 1.17 1.39(H)  Sodium 135 - 145 mmol/L 133(L) 136 136  Potassium 3.5 - 5.1 mmol/L 4.2 4.0 3.5  Chloride 98 - 111 mmol/L 103 105 103  CO2 22 - 32 mmol/L 24 25 25   Calcium 8.9 - 10.3 mg/dL 9.2 9.1 8.9  Total Protein 6.5 - 8.1 g/dL 7.8 7.6 7.2  Total Bilirubin 0.3 - 1.2 mg/dL 0.9 1.1 0.8  Alkaline Phos 38 - 126 U/L 69 71 72  AST 15 - 41 U/L 29 22 19   ALT 0 - 44 U/L 24 18 12     DIAGNOSTIC IMAGING:  I have independently reviewed Derrick scans and discussed with Derrick Long. No results found.    ASSESSMENT:  1.  Metastatic prostate cancer to Derrick right adrenal gland: -Presentation to [place] ER with worsening right-sided back pain, which has been on and off for Derrick last couple of years. - Some decrease in appetite with 6 pound weight loss in Derrick last 6 months.  No hematuria or other B symptoms. - CT renal study on 05/03/2020 showed new right adrenal mass with bilateral renal lesions.  Indeterminate L2 vertebral bodies sclerotic lesion.  Low-attenuation lesions in Derrick kidneys measure up to 2.3 cm on Derrick right and were similar to scan from July 2021. - CT CAP on 06/12/2020 did not show any evidence of nodal metastasis or metastatic disease in Derrick chest.  Right adrenal mass measures 4.2 x 3.8 cm exerts mass-effect upon Derrick posterior and right lateral wall of Derrick IVC.  Right kidney masses measuring 2.3 x 2.3 cm and 1.9 x 1.7 cm suspicious for RCC.  Upper pole of Derrick left kidney lesion 2.2  x 2.0 cm and medial cortex of Derrick inferior pole measuring 2 x 2 cm and another interpolar left kidney lesion measuring 1.3 x 1.5 cm. - Bone scan on 06/13/2020 was negative. - Right adrenal biopsy consistent with prostatic adenocarcinoma   2.  Social/family history: - He lives at home with his daughter.  He walks with a cane. - He mows lawn with a riding mower.  He has a garden and gross  vegetables.  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  Quit smoking 25 years ago. - No family history of malignancies that he is aware of.   PLAN:  1.  Metastatic prostate cancer to [place] right adrenal gland: - We discussed Derrick findings on Derrick right adrenal mass biopsy on 06/26/2020. - Pathology consistent with metastatic carcinoma, positive for prostein and cytokeratin AE1/AE3. - His PSA was elevated at 10.2 on 06/06/2020, dropped to 1.58 on 08/06/2020. - We discussed normal treatment plan for metastatic castration sensitive prostate cancer with antiandrogen therapy.  We will give him loading dose of degarelix to prevent flare.  This will be followed by Lupron every 6 months.  Because of his advanced age, we did not consider adding novel antiandrogens. - We are also not considering further biopsies of Derrick renal masses given Derrick risk of bleeding and his advanced age.  This was explained to Der[place] Long and his daughter who acknowledged understanding. - There is some confusion as to whether or not Derrick Long had already received his Lupron shot.  He did receive a shot of Firmagon on 07/17/2018 but Derrick Long does endorse receiving 2 shots.  After review Derrick records and review of Derrick records with pharmacy it does not appear that Derrick Long received a Lupron shot.  We attempted to get this today, but Derrick soonest we could possibly administer this would be 08/21/2020.  We will plan to have Derrick shot given on that day and have Derrick Long return to clinic approximately 1 month later to assure tolerance.   2.  Right-sided back pain: -  He is taking tramadol as needed which is helping. - I have sent tramadol prescription to both [facility] and  local pharmacy.   Orders placed this encounter:  No orders of Derrick defined types were placed in this encounter.  Total time spent is 30 minutes with more than 50% of Derrick time spent face-to-face discussing new diagnosis, prognosis, treatment plan, counseling  and coordination of care.  Ledell Peoples, MD Department of Hematology/Oncology [place] at [facility] Phone: [phone] Pager: [phone] Email: [email or web address]

## 2020-08-14 ENCOUNTER — Encounter (HOSPITAL_COMMUNITY): Payer: Self-pay | Admitting: Hematology

## 2020-08-16 ENCOUNTER — Other Ambulatory Visit (HOSPITAL_COMMUNITY): Payer: Self-pay

## 2020-08-16 DIAGNOSIS — E278 Other specified disorders of adrenal gland: Secondary | ICD-10-CM

## 2020-08-16 DIAGNOSIS — C641 Malignant neoplasm of right kidney, except renal pelvis: Secondary | ICD-10-CM

## 2020-08-16 DIAGNOSIS — C61 Malignant neoplasm of prostate: Secondary | ICD-10-CM

## 2020-08-21 ENCOUNTER — Inpatient Hospital Stay (HOSPITAL_COMMUNITY): Payer: Medicare Other | Attending: Hematology

## 2020-08-21 ENCOUNTER — Other Ambulatory Visit: Payer: Self-pay

## 2020-08-21 ENCOUNTER — Encounter (HOSPITAL_COMMUNITY): Payer: Self-pay

## 2020-08-21 VITALS — BP 138/69 | HR 74 | Temp 96.9°F | Resp 18 | Wt 188.1 lb

## 2020-08-21 DIAGNOSIS — C641 Malignant neoplasm of right kidney, except renal pelvis: Secondary | ICD-10-CM | POA: Diagnosis present

## 2020-08-21 DIAGNOSIS — Z5111 Encounter for antineoplastic chemotherapy: Secondary | ICD-10-CM | POA: Insufficient documentation

## 2020-08-21 DIAGNOSIS — C61 Malignant neoplasm of prostate: Secondary | ICD-10-CM | POA: Diagnosis present

## 2020-08-21 MED ORDER — LEUPROLIDE ACETATE (6 MONTH) 45 MG ~~LOC~~ KIT
45.0000 mg | PACK | Freq: Once | SUBCUTANEOUS | Status: AC
  Administered 2020-08-21: 45 mg via SUBCUTANEOUS
  Filled 2020-08-21: qty 45

## 2020-08-21 NOTE — Patient Instructions (Signed)
[facility]  Discharge Instructions: Thank you for choosing [facility] to provide your oncology and hematology care.  If you have a lab appointment with the [facility], please come in thru the Main Entrance and check in at the main information desk.  Wear comfortable clothing and clothing appropriate for easy access to any Portacath or PICC line.   We strive to give you quality time with your provider. You may need to reschedule your appointment if you arrive late (15 or more minutes).  Arriving late affects you and other patients whose appointments are after yours.  Also, if you miss three or more appointments without notifying the office, you may be dismissed from the clinic at the provider's discretion.      For prescription refill requests, have your pharmacy contact our office and allow 72 hours for refills to be completed.    Today you received the following chemotherapy and/or immunotherapy agents: Leuprorelin   To help prevent nausea and vomiting after your treatment, we encourage you to take your nausea medication as directed.  BELOW ARE SYMPTOMS THAT SHOULD BE REPORTED IMMEDIATELY: *FEVER GREATER THAN 100.4 F (38 C) OR HIGHER *CHILLS OR SWEATING *NAUSEA AND VOMITING THAT IS NOT CONTROLLED WITH YOUR NAUSEA MEDICATION *UNUSUAL SHORTNESS OF BREATH *UNUSUAL BRUISING OR BLEEDING *URINARY PROBLEMS (pain or burning when urinating, or frequent urination) *BOWEL PROBLEMS (unusual diarrhea, constipation, pain near the anus) TENDERNESS IN MOUTH AND THROAT WITH OR WITHOUT PRESENCE OF ULCERS (sore throat, sores in mouth, or a toothache) UNUSUAL RASH, SWELLING OR PAIN  UNUSUAL VAGINAL DISCHARGE OR ITCHING   Items with * indicate a potential emergency and should be followed up as soon as possible or go to the Emergency Department if any problems should occur.  Please show the CHEMOTHERAPY ALERT CARD or IMMUNOTHERAPY ALERT CARD at check-in to the Emergency  Department and triage nurse.  Should you have questions after your visit or need to cancel or reschedule your appointment, please contact [facility] [phone]  and follow the prompts.  Office hours are 8:00 a.m. to 4:30 p.m. Monday - Friday. Please note that voicemails left after 4:00 p.m. may not be returned until the following business day.  We are closed weekends and major holidays. You have access to a nurse at all times for urgent questions. Please call the main number to the clinic [phone] and follow the prompts.  For any non-urgent questions, you may also contact your provider using MyChart. We now offer e-Visits for anyone 109 and older to request care online for non-urgent symptoms. For details visit [email or web address].   Also download the MyChart app! Go to the app store, search "MyChart", open the app, select [facility], and log in with your MyChart username and password.  Due to Covid, a mask is required upon entering the hospital/clinic. If you do not have a mask, one will be given to you upon arrival. For doctor visits, patients may have 1 support person aged 32 or older with them. For treatment visits, patients cannot have anyone with them due to current Covid guidelines and our immunocompromised population.

## 2020-08-21 NOTE — Progress Notes (Signed)
Patient tolerated Leuprolide injection with no complaints voiced. Site clean and dry with no bruising or swelling noted at site. See MAR for details. Band aid applied.  Patient stable during and after injection. Patient and daughter given AVS and Leuprolide information handout at discharge. VSS with discharge and left in satisfactory condition with no s/s of distress noted.

## 2020-09-10 ENCOUNTER — Inpatient Hospital Stay (HOSPITAL_COMMUNITY): Payer: Medicare Other

## 2020-09-10 ENCOUNTER — Other Ambulatory Visit: Payer: Self-pay

## 2020-09-10 DIAGNOSIS — C641 Malignant neoplasm of right kidney, except renal pelvis: Secondary | ICD-10-CM

## 2020-09-10 DIAGNOSIS — C61 Malignant neoplasm of prostate: Secondary | ICD-10-CM

## 2020-09-10 DIAGNOSIS — E278 Other specified disorders of adrenal gland: Secondary | ICD-10-CM

## 2020-09-10 DIAGNOSIS — Z5111 Encounter for antineoplastic chemotherapy: Secondary | ICD-10-CM | POA: Diagnosis not present

## 2020-09-10 DIAGNOSIS — C642 Malignant neoplasm of left kidney, except renal pelvis: Secondary | ICD-10-CM

## 2020-09-10 LAB — COMPREHENSIVE METABOLIC PANEL
ALT: 24 U/L (ref 0–44)
AST: 26 U/L (ref 15–41)
Albumin: 3.8 g/dL (ref 3.5–5.0)
Alkaline Phosphatase: 69 U/L (ref 38–126)
Anion gap: 7 (ref 5–15)
BUN: 26 mg/dL — ABNORMAL HIGH (ref 8–23)
CO2: 25 mmol/L (ref 22–32)
Calcium: 8.9 mg/dL (ref 8.9–10.3)
Chloride: 102 mmol/L (ref 98–111)
Creatinine, Ser: 1.21 mg/dL (ref 0.61–1.24)
GFR, Estimated: 56 mL/min — ABNORMAL LOW (ref >60)
Glucose, Bld: 87 mg/dL (ref 70–99)
Potassium: 4.5 mmol/L (ref 3.5–5.1)
Sodium: 134 mmol/L — ABNORMAL LOW (ref 135–145)
Total Bilirubin: 0.7 mg/dL (ref 0.3–1.2)
Total Protein: 7.8 g/dL (ref 6.5–8.1)

## 2020-09-10 LAB — CBC WITH DIFFERENTIAL/PLATELET
Abs Immature Granulocytes: 0.02 10*3/uL (ref 0.00–0.07)
Basophils Absolute: 0 10*3/uL (ref 0.0–0.1)
Basophils Relative: 0 %
Eosinophils Absolute: 0.1 10*3/uL (ref 0.0–0.5)
Eosinophils Relative: 2 %
HCT: 36.6 % — ABNORMAL LOW (ref 39.0–52.0)
Hemoglobin: 12.5 g/dL — ABNORMAL LOW (ref 13.0–17.0)
Immature Granulocytes: 0 %
Lymphocytes Relative: 38 %
Lymphs Abs: 1.7 10*3/uL (ref 0.7–4.0)
MCH: 34.2 pg — ABNORMAL HIGH (ref 26.0–34.0)
MCHC: 34.2 g/dL (ref 30.0–36.0)
MCV: 100 fL (ref 80.0–100.0)
Monocytes Absolute: 0.6 10*3/uL (ref 0.1–1.0)
Monocytes Relative: 13 %
Neutro Abs: 2 10*3/uL (ref 1.7–7.7)
Neutrophils Relative %: 47 %
Platelets: 198 10*3/uL (ref 150–400)
RBC: 3.66 MIL/uL — ABNORMAL LOW (ref 4.22–5.81)
RDW: 13 % (ref 11.5–15.5)
WBC: 4.5 10*3/uL (ref 4.0–10.5)
nRBC: 0 % (ref 0.0–0.2)

## 2020-09-10 LAB — PSA: Prostatic Specific Antigen: 0.86 ng/mL (ref 0.00–4.00)

## 2020-09-10 LAB — LACTATE DEHYDROGENASE: LDH: 192 U/L (ref 98–192)

## 2020-09-16 NOTE — Progress Notes (Signed)
[address] 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    CANCER STAGING: Cancer Staging Prostate cancer [facility] Staging form: Prostate, AJCC 8th Edition - Clinical stage from 07/10/2020: Stage IVB (cTX, cNX, pM1c, PSA: 10.2) - Unsigned   INTERVAL HISTORY:  Derrick Long, a 87 y.o. 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 Derrick Long was last seen on 07/10/20.   Today he reports feeling well and is accompanied by his daughter. He denies hot flashes, elevated fatigue, and new pain, but his daughter reports he is not drinking sufficient water daily. He reports intermittent lower back pain on the left which is worsened with movement. He takes tramadol once daily in the morning. He denies any pain with urination, but his daughter reports his urine has a strong smell; he also reports urgency. He reports a knot on his lower left abdomen where he received his previous injection, and it has improved over time.   REVIEW OF SYSTEMS:  Review of Systems  Constitutional:  Positive for appetite change (75%) and fatigue (70%).  Genitourinary:  Positive for frequency.   Musculoskeletal:  Positive for back pain (8/10).  Psychiatric/Behavioral:  Positive for sleep disturbance (staying asleep).   All other systems reviewed and are negative.  PAST MEDICAL/SURGICAL HISTORY:  Past Medical History:  Diagnosis Date   Acid reflux    Anemia    Ex-cigarette smoker    Gout    Hyperlipemia    Hypertension    Paroxysmal atrial fibrillation ([place]) 2014    Prostate cancer ([place]) 6.23.2015   SEEDS 20 YEARS AGO   Prostate disease    Past Surgical History:  Procedure Laterality Date   LEFT HEART CATHETERIZATION WITH CORONARY ANGIOGRAM N/A 07/21/2012   Procedure: LEFT HEART CATHETERIZATION WITH CORONARY ANGIOGRAM;  Surgeon: Troy Sine, MD;  Location: [facility] CATH LAB;  Service: Cardiovascular;  Laterality: N/A;   PACEMAKER INSERTION     PERMANENT PACEMAKER INSERTION N/A 07/22/2012   Procedure: PERMANENT PACEMAKER INSERTION;  Surgeon: Sanda Klein, MD;  Location: [facility] CATH LAB;  Service: Cardiovascular;  Laterality: N/A;   PROSTATE SURGERY      SOCIAL HISTORY:  Social History   Socioeconomic History   Marital status: Married    Spouse name: Not on file   Number of children: Not on file   Years of education: Not on file   Highest education level: Not on file  Occupational History   Not on file  Tobacco Use   Smoking status: Former    Types: Cigarettes    Quit date: 07/20/2002    Years since quitting: 18.1   Smokeless tobacco: Never  Vaping Use   Vaping Use: Never used  Substance and Sexual Activity   Alcohol use: No   Drug use: No   Sexual activity: Not on file  Other Topics Concern   Not on file  Social History Narrative   Not on file   Social Determinants of Health   Financial Resource Strain: Low Risk    Difficulty of Paying Living Expenses: Not hard at  all  Food Insecurity: No Food Insecurity   Worried About Charity fundraiser in the Last Year: Never true   Ran Out of Food in the Last Year: Never true  Transportation Needs: No Transportation Needs   Lack of Transportation (Medical): No   Lack of Transportation (Non-Medical): No  Physical Activity: Insufficiently Active   Days of Exercise per Week: 3 days   Minutes of Exercise per Session: 30 min  Stress: No Stress Concern Present   Feeling of Stress : Not at all  Social Connections: Moderately Integrated   Frequency of Communication with Friends and Family: More than  three times a week   Frequency of Social Gatherings with Friends and Family: More than three times a week   Attends Religious Services: More than 4 times per year   Active Member of Clubs or Organizations: No   Attends Archivist Meetings: 1 to 4 times per year   Marital Status: Widowed  Human resources officer Violence: Not At Risk   Fear of Current or Ex-Partner: No   Emotionally Abused: No   Physically Abused: No   Sexually Abused: No    FAMILY HISTORY:  No family history on file.  CURRENT MEDICATIONS:  Current Outpatient Medications  Medication Sig Dispense Refill   amLODipine (NORVASC) 10 MG tablet Take 10 mg by mouth daily.     atorvastatin (LIPITOR) 20 MG tablet Take 1 tablet (20 mg total) by mouth daily at 6 PM. (Patient taking differently: Take 20 mg by mouth at bedtime.) 30 tablet 5   carvedilol (COREG) 25 MG tablet Take 50 mg by mouth 2 (two) times daily with a meal.     Cholecalciferol 25 MCG (1000 UT) tablet Take 1,000 Units by mouth daily.     colchicine 0.6 MG tablet Take 1 tablet (0.6 mg total) by mouth 2 (two) times daily as needed (gout). (Patient taking differently: Take 0.6-1.2 mg by mouth See admin instructions. Take 1.2 mg at onset of gout flare, may take a second 0.6 mg dose 1 hour later as needed for gout) 30 tablet 0   diclofenac sodium (VOLTAREN) 1 % GEL Apply 2 g topically 4 (four) times daily. (Patient taking differently: Apply 2 g topically 4 (four) times daily as needed (pain).) 100 g 0   Ensure (ENSURE) Take 237 mLs by mouth daily as needed (nutrition).     ferrous sulfate 324 MG TBEC Take 324 mg by mouth 2 (two) times daily.     fluticasone (FLONASE) 50 MCG/ACT nasal spray Place 2 sprays into both nostrils daily. (Patient taking differently: Place 2 sprays into both nostrils daily as needed for allergies.) 16 g 2   HYDROcodone-acetaminophen (NORCO/VICODIN) 5-325 MG tablet Take 1 tablet by mouth every 6 (six) hours as needed for moderate pain. 20 tablet 0    omeprazole (PRILOSEC) 20 MG capsule Take 20 mg by mouth daily as needed (acid reflux).     potassium chloride SA (KLOR-CON) 20 MEQ tablet Take 20 mEq by mouth daily.     rivaroxaban (XARELTO) 20 MG TABS tablet Take 1 tablet (20 mg total) by mouth daily with supper. 20 tablet 0   tamsulosin (FLOMAX) 0.4 MG CAPS capsule Take 0.8 mg by mouth at bedtime.     traMADol (ULTRAM) 50 MG tablet Take 1 tablet (50 mg total) by mouth daily as needed for moderate pain or severe pain. 60 tablet 0   No current facility-administered medications for this visit.    ALLERGIES:  No  Known Allergies  PHYSICAL EXAM:  Performance status (ECOG): 1 - Symptomatic but completely ambulatory  There were no vitals filed for this visit. Wt Readings from Last 3 Encounters:  08/21/20 188 lb 0.8 oz (85.3 kg)  08/13/20 186 lb 6.4 oz (84.6 kg)  07/10/20 186 lb 14.4 oz (84.8 kg)   Physical Exam Vitals reviewed.  Constitutional:      Appearance: Normal appearance.  Cardiovascular:     Rate and Rhythm: Normal rate and regular rhythm.     Pulses: Normal pulses.     Heart sounds: Normal heart sounds.  Pulmonary:     Effort: Pulmonary effort is normal.     Breath sounds: Normal breath sounds.  Neurological:     General: No focal deficit present.     Mental Status: He is alert and oriented to person, place, and time.  Psychiatric:        Mood and Affect: Mood normal.        Behavior: Behavior normal.     LABORATORY DATA:  I have reviewed the labs as listed.  CBC Latest Ref Rng & Units 09/10/2020 08/06/2020 06/26/2020  WBC 4.0 - 10.5 K/uL 4.5 3.6(L) 4.3  Hemoglobin 13.0 - 17.0 g/dL 12.5(L) 12.4(L) 13.3  Hematocrit 39.0 - 52.0 % 36.6(L) 36.7(L) 39.2  Platelets 150 - 400 K/uL 198 210 201   CMP Latest Ref Rng & Units 09/10/2020 08/06/2020 06/06/2020  Glucose 70 - 99 mg/dL 87 99 101(H)  BUN 8 - 23 mg/dL 26(H) 28(H) 20  Creatinine 0.61 - 1.24 mg/dL 1.21 1.31(H) 1.17  Sodium 135 - 145 mmol/L 134(L) 133(L) 136   Potassium 3.5 - 5.1 mmol/L 4.5 4.2 4.0  Chloride 98 - 111 mmol/L 102 103 105  CO2 22 - 32 mmol/L '25 24 25  '$ Calcium 8.9 - 10.3 mg/dL 8.9 9.2 9.1  Total Protein 6.5 - 8.1 g/dL 7.8 7.8 7.6  Total Bilirubin 0.3 - 1.2 mg/dL 0.7 0.9 1.1  Alkaline Phos 38 - 126 U/L 69 69 71  AST 15 - 41 U/L '26 29 22  '$ ALT 0 - 44 U/L '24 24 18    '$ DIAGNOSTIC IMAGING:  I have independently reviewed the scans and discussed with the patient. No results found.   ASSESSMENT:  1.  Metastatic prostate cancer to the right adrenal gland: -Presentation to the ER with worsening right-sided back pain, which has been on and off for the last couple of years. - Some decrease in appetite with 6 pound weight loss in the last 6 months.  No hematuria or other B symptoms. - CT renal study on 05/03/2020 showed new right adrenal mass with bilateral renal lesions.  Indeterminate L2 vertebral bodies sclerotic lesion.  Low-attenuation lesions in the kidneys measure up to 2.3 cm on the right and were similar to scan from July 2021. - CT CAP on 06/12/2020 did not show any evidence of nodal metastasis or metastatic disease in the chest.  Right adrenal mass measures 4.2 x 3.8 cm exerts mass-effect upon the posterior and right lateral wall of the IVC.  Right kidney masses measuring 2.3 x 2.3 cm and 1.9 x 1.7 cm suspicious for RCC.  Upper pole of the left kidney lesion 2.2 x 2.0 cm and medial cortex of the inferior pole measuring 2 x 2 cm and another interpolar left kidney lesion measuring 1.3 x 1.5 cm. - Bone scan on 06/13/2020 was negative. - Right adrenal biopsy consistent with prostatic adenocarcinoma - Degarelix on 07/16/2020, transition to Eligard 45 mg  on 08/21/2020.   2.  Social/family history: - He lives at home with his daughter.  He walks with a cane. - He mows lawn with a riding mower.  He has a garden and gross vegetables.  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  Quit smoking 25 years ago. - No family history of malignancies that  he is aware of.   PLAN:  1.  Metastatic prostate cancer to the right adrenal gland: - He has tolerated Eligard reasonably well.  He does not report any major hot flashes but had some tiredness. - Reviewed his labs.  His PSA improved to 0.86 from 10.4.  LFTs are within normal limits. - I have counseled him to increase his physical activity to improve his fatigue. - He was told to increase his water intake to five 16 ounce bottles per day. - I have recommended doing a CT scan of the abdomen and pelvis with contrast prior to next visit in 6 weeks to evaluate response.  We will follow-up on the kidney lesions to see if there are also improving.  If not we will consider biopsy of kidney lesion.   2.  Left-sided back pain: - He reports worsening of pain on activity.  His bone scan is negative for metastasis.  His last CT scan showed multilevel lumbar degenerative disc disease. - He is taking 1 tablet of tramadol daily in the morning which is helping.  3.  Urinary urgency: - He reports urinary urgency for the past few months.  Likely diagnosis is overactive bladder. - I offered urology consultation with Dr. Alyson Ingles.  His daughter reports that he already has an appointment at [facility] in [place].   Orders placed this encounter:  No orders of the defined types were placed in this encounter.    Derek Jack, MD [place] [phone]   I, Thana Ates, am acting as a scribe for Dr. Derek Jack.  I, Derek Jack MD, have reviewed the above documentation for accuracy and completeness, and I agree with the above.

## 2020-09-17 ENCOUNTER — Other Ambulatory Visit: Payer: Self-pay

## 2020-09-17 ENCOUNTER — Inpatient Hospital Stay (HOSPITAL_COMMUNITY): Payer: Medicare Other | Attending: Hematology | Admitting: Hematology

## 2020-09-17 DIAGNOSIS — R3915 Urgency of urination: Secondary | ICD-10-CM | POA: Diagnosis not present

## 2020-09-17 DIAGNOSIS — C7971 Secondary malignant neoplasm of right adrenal gland: Secondary | ICD-10-CM | POA: Insufficient documentation

## 2020-09-17 DIAGNOSIS — C61 Malignant neoplasm of prostate: Secondary | ICD-10-CM | POA: Diagnosis not present

## 2020-09-17 NOTE — Patient Instructions (Addendum)
[place] at [facility] Discharge Instructions  You were seen today by Dr. Delton Coombes. He went over your recent results. Stay hydrated by drinking plenty of water: at least five 16 oz bottles daily. You will be scheduled for a CT scan of your abdomen and pelvis prior to your next appointment. Dr. Delton Coombes will see you back in 6 weeks for labs and follow up.   Thank you for choosing [place] at [facility] to provide your oncology and hematology care.  To afford each patient quality time with our provider, please arrive at least 15 minutes before your scheduled appointment time.   If you have a lab appointment with the [facility] please come in thru the Main Entrance and check in at the main information desk  You need to re-schedule your appointment should you arrive 10 or more minutes late.  We strive to give you quality time with our providers, and arriving late affects you and other patients whose appointments are after yours.  Also, if you no show three or more times for appointments you may be dismissed from the clinic at the providers discretion.     Again, thank you for choosing [facility].  Our hope is that these requests will decrease the amount of time that you wait before being seen by our physicians.       _____________________________________________________________  Should you have questions after your visit to [facility], please contact our office at [phone] between the hours of 8:00 a.m. and 4:30 p.m.  Voicemails left after 4:00 p.m. will not be returned until the following business day.  For prescription refill requests, have your pharmacy contact our office and allow 72 hours.    Cancer Center Support Programs:   > Cancer Support Group  2nd Tuesday of the month 1pm-2pm, Journey Room

## 2020-10-24 ENCOUNTER — Inpatient Hospital Stay (HOSPITAL_COMMUNITY): Payer: Medicare Other | Attending: Hematology

## 2020-10-24 ENCOUNTER — Other Ambulatory Visit: Payer: Self-pay

## 2020-10-24 DIAGNOSIS — M549 Dorsalgia, unspecified: Secondary | ICD-10-CM | POA: Insufficient documentation

## 2020-10-24 DIAGNOSIS — C61 Malignant neoplasm of prostate: Secondary | ICD-10-CM | POA: Diagnosis not present

## 2020-10-24 DIAGNOSIS — Z7901 Long term (current) use of anticoagulants: Secondary | ICD-10-CM | POA: Insufficient documentation

## 2020-10-24 DIAGNOSIS — I700 Atherosclerosis of aorta: Secondary | ICD-10-CM | POA: Insufficient documentation

## 2020-10-24 DIAGNOSIS — C7971 Secondary malignant neoplasm of right adrenal gland: Secondary | ICD-10-CM | POA: Insufficient documentation

## 2020-10-24 DIAGNOSIS — C642 Malignant neoplasm of left kidney, except renal pelvis: Secondary | ICD-10-CM | POA: Insufficient documentation

## 2020-10-24 DIAGNOSIS — M5116 Intervertebral disc disorders with radiculopathy, lumbar region: Secondary | ICD-10-CM | POA: Insufficient documentation

## 2020-10-24 DIAGNOSIS — Z79899 Other long term (current) drug therapy: Secondary | ICD-10-CM | POA: Insufficient documentation

## 2020-10-24 DIAGNOSIS — M4726 Other spondylosis with radiculopathy, lumbar region: Secondary | ICD-10-CM | POA: Diagnosis not present

## 2020-10-24 DIAGNOSIS — M5146 Schmorl's nodes, lumbar region: Secondary | ICD-10-CM | POA: Diagnosis not present

## 2020-10-24 DIAGNOSIS — Z87891 Personal history of nicotine dependence: Secondary | ICD-10-CM | POA: Diagnosis not present

## 2020-10-24 DIAGNOSIS — C641 Malignant neoplasm of right kidney, except renal pelvis: Secondary | ICD-10-CM | POA: Insufficient documentation

## 2020-10-24 DIAGNOSIS — E278 Other specified disorders of adrenal gland: Secondary | ICD-10-CM

## 2020-10-24 LAB — CBC WITH DIFFERENTIAL/PLATELET
Abs Immature Granulocytes: 0.01 10*3/uL (ref 0.00–0.07)
Basophils Absolute: 0 10*3/uL (ref 0.0–0.1)
Basophils Relative: 1 %
Eosinophils Absolute: 0.1 10*3/uL (ref 0.0–0.5)
Eosinophils Relative: 3 %
HCT: 34.8 % — ABNORMAL LOW (ref 39.0–52.0)
Hemoglobin: 11.7 g/dL — ABNORMAL LOW (ref 13.0–17.0)
Immature Granulocytes: 0 %
Lymphocytes Relative: 41 %
Lymphs Abs: 1.6 10*3/uL (ref 0.7–4.0)
MCH: 33.4 pg (ref 26.0–34.0)
MCHC: 33.6 g/dL (ref 30.0–36.0)
MCV: 99.4 fL (ref 80.0–100.0)
Monocytes Absolute: 0.5 10*3/uL (ref 0.1–1.0)
Monocytes Relative: 12 %
Neutro Abs: 1.7 10*3/uL (ref 1.7–7.7)
Neutrophils Relative %: 43 %
Platelets: 192 10*3/uL (ref 150–400)
RBC: 3.5 MIL/uL — ABNORMAL LOW (ref 4.22–5.81)
RDW: 12.7 % (ref 11.5–15.5)
WBC: 3.9 10*3/uL — ABNORMAL LOW (ref 4.0–10.5)
nRBC: 0 % (ref 0.0–0.2)

## 2020-10-24 LAB — COMPREHENSIVE METABOLIC PANEL
ALT: 22 U/L (ref 0–44)
AST: 24 U/L (ref 15–41)
Albumin: 3.6 g/dL (ref 3.5–5.0)
Alkaline Phosphatase: 68 U/L (ref 38–126)
Anion gap: 5 (ref 5–15)
BUN: 26 mg/dL — ABNORMAL HIGH (ref 8–23)
CO2: 26 mmol/L (ref 22–32)
Calcium: 9 mg/dL (ref 8.9–10.3)
Chloride: 104 mmol/L (ref 98–111)
Creatinine, Ser: 1.16 mg/dL (ref 0.61–1.24)
GFR, Estimated: 59 mL/min — ABNORMAL LOW (ref >60)
Glucose, Bld: 95 mg/dL (ref 70–99)
Potassium: 4.5 mmol/L (ref 3.5–5.1)
Sodium: 135 mmol/L (ref 135–145)
Total Bilirubin: 0.6 mg/dL (ref 0.3–1.2)
Total Protein: 7.2 g/dL (ref 6.5–8.1)

## 2020-10-24 LAB — PSA: Prostatic Specific Antigen: 1.28 ng/mL (ref 0.00–4.00)

## 2020-10-24 LAB — LACTATE DEHYDROGENASE: LDH: 177 U/L (ref 98–192)

## 2020-10-25 ENCOUNTER — Ambulatory Visit (HOSPITAL_COMMUNITY)
Admission: RE | Admit: 2020-10-25 | Discharge: 2020-10-25 | Disposition: A | Discharge status: Home / Self Care | Payer: Medicare Other | Source: Ambulatory Visit | Attending: Hematology | Admitting: Hematology

## 2020-10-25 ENCOUNTER — Inpatient Hospital Stay (HOSPITAL_COMMUNITY): Payer: No Typology Code available for payment source

## 2020-10-25 DIAGNOSIS — C61 Malignant neoplasm of prostate: Secondary | ICD-10-CM | POA: Diagnosis not present

## 2020-10-25 MED ORDER — IOHEXOL 350 MG/ML SOLN
100.0000 mL | Freq: Once | INTRAVENOUS | Status: AC | PRN
  Administered 2020-10-25: 80 mL via INTRAVENOUS

## 2020-10-29 NOTE — Progress Notes (Signed)
Derrick Long, Derrick Long [id number]   CLINIC:  Medical Oncology/Hematology  PCP:  [facility] [address] 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 Derrick Long was last seen on 09/17/2020.   Today he reports feeling well. He takes 1 tramadol a day for his left back pain which has not helped. He had a pacemaker implanted in 07/22/2012.   REVIEW OF SYSTEMS:  Review of Systems  Constitutional:  Negative for appetite change (50%) and fatigue (50%).  Musculoskeletal:  Positive for back pain (6/10 L side).  All other systems reviewed and are negative.  PAST MEDICAL/SURGICAL HISTORY:  Past Medical History:  Diagnosis Date   Acid reflux    Anemia    Ex-cigarette smoker    Gout    Hyperlipemia    Hypertension    Paroxysmal atrial fibrillation ([place]) 2014   Prostate cancer ([place]) 6.23.2015   SEEDS 20 YEARS AGO   Prostate disease    Past Surgical History:  Procedure Laterality Date   LEFT HEART CATHETERIZATION WITH CORONARY ANGIOGRAM N/A 07/21/2012   Procedure: LEFT HEART CATHETERIZATION WITH CORONARY ANGIOGRAM;  Surgeon: Troy Sine, MD;  Location: [facility] CATH LAB;  Service: Cardiovascular;  Laterality: N/A;   PACEMAKER INSERTION     PERMANENT PACEMAKER INSERTION N/A 07/22/2012   Procedure: PERMANENT PACEMAKER INSERTION;  Surgeon: Sanda Klein, MD;  Location: [facility] CATH LAB;  Service: Cardiovascular;  Laterality: N/A;    PROSTATE SURGERY      SOCIAL HISTORY:  Social History   Socioeconomic History   Marital status: Married    Spouse name: Not on file   Number of children: Not on file   Years of education: Not on file   Highest education level: Not on file  Occupational History   Not on file  Tobacco Use   Smoking status: Former    Types: Cigarettes    Quit date: 07/20/2002    Years since quitting: 18.2   Smokeless tobacco: Never  Vaping Use   Vaping Use: Never used  Substance and Sexual Activity   Alcohol use: No   Drug use: No   Sexual activity: Not on file  Other Topics Concern   Not on file  Social History Narrative   Not on file   Social Determinants of Health   Financial Resource Strain: Low Risk    Difficulty of Paying Living Expenses: Not hard at all  Food Insecurity: No Food Insecurity   Worried About Charity fundraiser in the Last Year: Never true   [place] in the Last Year: Never true  Transportation Needs: No Transportation Needs   Lack of Transportation (Medical): No   Lack of Transportation (Non-Medical): No  Physical Activity: Insufficiently Active   Days of Exercise per Week: 3 days   Minutes of Exercise per Session: 30 min  Stress: No Stress Concern Present   Feeling of Stress : Not at all  Social Connections: Moderately Integrated  Frequency of Communication with Friends and Family: More than three times a week   Frequency of Social Gatherings with Friends and Family: More than three times a week   Attends Religious Services: More than 4 times per year   Active Member of Clubs or Organizations: No   Attends Archivist Meetings: 1 to 4 times per year   Marital Status: Widowed  Human resources officer Violence: Not At Risk   Fear of Current or Ex-Partner: No   Emotionally Abused: No   Physically Abused: No   Sexually Abused: No    FAMILY HISTORY:  No family history on file.  CURRENT MEDICATIONS:  Current Outpatient Medications  Medication Sig  Dispense Refill   amLODipine (NORVASC) 10 MG tablet Take 10 mg by mouth daily.     atorvastatin (LIPITOR) 20 MG tablet Take 1 tablet (20 mg total) by mouth daily at 6 PM. (Patient taking differently: Take 20 mg by mouth at bedtime.) 30 tablet 5   carvedilol (COREG) 25 MG tablet Take 50 mg by mouth 2 (two) times daily with a meal.     Cholecalciferol 25 MCG (1000 UT) tablet Take 1,000 Units by mouth daily.     colchicine 0.6 MG tablet Take 1 tablet (0.6 mg total) by mouth 2 (two) times daily as needed (gout). 30 tablet 0   diclofenac sodium (VOLTAREN) 1 % GEL Apply 2 g topically 4 (four) times daily. (Patient taking differently: Apply 2 g topically 4 (four) times daily as needed (pain).) 100 g 0   Ensure (ENSURE) Take 237 mLs by mouth daily as needed (nutrition).     ferrous sulfate 324 MG TBEC Take 324 mg by mouth 2 (two) times daily.     HYDROcodone-acetaminophen (NORCO/VICODIN) 5-325 MG tablet Take 1 tablet by mouth every 6 (six) hours as needed for moderate pain. 20 tablet 0   omeprazole (PRILOSEC) 20 MG capsule Take 20 mg by mouth daily as needed (acid reflux).     potassium chloride SA (KLOR-CON) 20 MEQ tablet Take 20 mEq by mouth daily.     rivaroxaban (XARELTO) 20 MG TABS tablet Take 1 tablet (20 mg total) by mouth daily with supper. 20 tablet 0   tamsulosin (FLOMAX) 0.4 MG CAPS capsule Take 0.8 mg by mouth at bedtime.     traMADol (ULTRAM) 50 MG tablet Take 1 tablet (50 mg total) by mouth daily as needed for moderate pain or severe pain. 60 tablet 0   fluticasone (FLONASE) 50 MCG/ACT nasal spray Place 2 sprays into both nostrils daily. (Patient not taking: Reported on 10/30/2020) 16 g 2   No current facility-administered medications for this visit.    ALLERGIES:  No Known Allergies  PHYSICAL EXAM:  Performance status (ECOG): 1 - Symptomatic but completely ambulatory  Vitals:   10/30/20 1358  BP: (!) 147/75  Pulse: (!) 59  Resp: 16  Temp: 97.7 F (36.5 C)  SpO2: 96%   Wt  Readings from Last 3 Encounters:  10/30/20 189 lb 6 oz (85.9 kg)  08/21/20 188 lb 0.8 oz (85.3 kg)  08/13/20 186 lb 6.4 oz (84.6 kg)   Physical Exam Vitals reviewed.  Constitutional:      Appearance: Normal appearance.  Cardiovascular:     Rate and Rhythm: Normal rate and regular rhythm.     Pulses: Normal pulses.     Heart sounds: Normal heart sounds.  Pulmonary:     Effort: Pulmonary effort is normal.     Breath sounds: Normal breath sounds.  Neurological:     General: No focal deficit present.     Mental Status: He is alert and oriented to person, place, and time.  Psychiatric:        Mood and Affect: Mood normal.        Behavior: Behavior normal.     LABORATORY DATA:  I have reviewed the labs as listed.  CBC Latest Ref Rng & Units 10/24/2020 09/10/2020 08/06/2020  WBC 4.0 - 10.5 K/uL 3.9(L) 4.5 3.6(L)  Hemoglobin 13.0 - 17.0 g/dL 11.7(L) 12.5(L) 12.4(L)  Hematocrit 39.0 - 52.0 % 34.8(L) 36.6(L) 36.7(L)  Platelets 150 - 400 K/uL 192 198 210   CMP Latest Ref Rng & Units 10/24/2020 09/10/2020 08/06/2020  Glucose 70 - 99 mg/dL 95 87 99  BUN 8 - 23 mg/dL 26(H) 26(H) 28(H)  Creatinine 0.61 - 1.24 mg/dL 1.16 1.21 1.31(H)  Sodium 135 - 145 mmol/L 135 134(L) 133(L)  Potassium 3.5 - 5.1 mmol/L 4.5 4.5 4.2  Chloride 98 - 111 mmol/L 104 102 103  CO2 22 - 32 mmol/L '26 25 24  '$ Calcium 8.9 - 10.3 mg/dL 9.0 8.9 9.2  Total Protein 6.5 - 8.1 g/dL 7.2 7.8 7.8  Total Bilirubin 0.3 - 1.2 mg/dL 0.6 0.7 0.9  Alkaline Phos 38 - 126 U/L 68 69 69  AST 15 - 41 U/L '24 26 29  '$ ALT 0 - 44 U/L '22 24 24    '$ DIAGNOSTIC IMAGING:  I have independently reviewed the scans and discussed with the patient. CT Abdomen Pelvis W Contrast  Result Date: 10/28/2020 CLINICAL DATA:  Prostate cancer surveillance. History of bilateral renal cell carcinoma metastatic to the right adrenal gland. EXAM: CT ABDOMEN AND PELVIS WITH CONTRAST TECHNIQUE: Multidetector CT imaging of the abdomen and pelvis was performed using the  standard protocol following bolus administration of intravenous contrast. CONTRAST:  34m OMNIPAQUE IOHEXOL 350 MG/ML SOLN COMPARISON:  Multiple exams, including 06/12/2020 FINDINGS: Lower chest: Mild cardiomegaly. Dual lead pacer noted. There is descending thoracic aortic atherosclerotic calcification. Aortic valve calcification. Mildly elevated left hemidiaphragm contributing to partial exclusion of the diaphragm and spleen on today's images. Hepatobiliary: Scattered foci of arterial phase enhancing nodularity in the liver suspicious for possible metastatic lesions, less likely due to heterogeneous enhancement related to contrast phase. Representative lesion in segment 2 of the liver measures 1.1 by 0.9 cm on image 13 series 2. Cholecystectomy. Common bile duct 1.0 cm in diameter, similar to prior, prominence of the central biliary tree potentially reflecting a physiologic response to prior cholecystectomy. Pancreas: Unremarkable Spleen: Unremarkable where included. Adrenals/Urinary Tract: Right adrenal mass 3.2 by 2.0 cm on image 22 series 2, formerly 5.0 by 3.5 cm by my measurements. This continues to abut the right side of the IVC although with less mass effect than before. Left inter renal gland remains unremarkable. Multifocal solid-appearing renal masses are observed including a 2.6 by 2.4 cm right mid kidney mass on image 33 series 7 (formerly 2.3 by 2.5 cm); a 2.5 by 2.3 cm exophytic mass from the right kidney lower pole on image 38 series 7 (formerly 2.6 by 2.3 cm by my measurements); a 2.3 by 2.0 cm exophytic mass from the left kidney upper pole on image 15 series 7 (formerly 2.5 by 1.9 cm by my measurements) a 2.1 by 2.2 cm left kidney lower pole mass on image 31 series 7 (formerly 2.1 by 2.2 cm by my measurements; and a 1.6 by 1.6 cm mass of the left mid kidney on image 21 series  7 (formerly 1.6 by 1.5 cm by my measurements). Given the slight differences in size which may be attributable to slice  selection, these renal masses are essentially stable from 06/12/2020. There are also additional hypodense lesions of varying complexity which could represent cysts or additional masses. The left renal collecting system is duplicated down to the level of the iliac vessel cross over. Urinary bladder unremarkable. Stomach/Bowel: Unremarkable Vascular/Lymphatic: Atherosclerosis is present, including aortoiliac atherosclerotic disease. No well-defined tumor thrombus in the IVC or right renal vein. No pathologic retroperitoneal or pelvic adenopathy. Reproductive: Brachytherapy seed implants in the prostate gland. Other: No supplemental non-categorized findings. Musculoskeletal: Severe osteoarthritis of the hips, right greater than left. Faint calcifications along the right iliopsoas muscle similar to prior. Levoconvex lumbar scoliosis with rotary component. Pseudoarticulation of the broad left L5 transverse process with the sacrum. Multilevel Schmorl's nodes in the lumbar spine. Lumbar spondylosis and degenerative disc disease causing multilevel impingement. IMPRESSION: 1. Substantially reduced size of the right adrenal metastatic lesion. 2. Overall roughly stable size of the multiple bilateral enhancing renal masses compatible with multifocal renal cell carcinoma. 3. Scattered somewhat indistinct nodular foci of arterial phase enhancement in the liver. Although possibly from vascular malformations, I cannot exclude possibility of early metastatic disease. Depending on the MRI compatibility of the patient's pacer, MRI of the liver and without contrast could be utilized for further characterization; alternatively given the underlying clinical context, these potential lesions could simply be surveilled. 4. Other imaging findings of potential clinical significance: Mild cardiomegaly. Mildly elevated left hemidiaphragm. Aortic Atherosclerosis (ICD10-I70.0). Aortic valve calcification. Severe osteoarthritis of the hips.  Multifocal lumbar impingement. Electronically Signed   By: Van Clines M.D.   On: 10/28/2020 11:40     ASSESSMENT:  1.  Metastatic prostate cancer to the right adrenal gland: -Presentation to the ER with worsening right-sided back pain, which has been on and off for the last couple of years. - Some decrease in appetite with 6 pound weight loss in the last 6 months.  No hematuria or other B symptoms. - CT renal study on 05/03/2020 showed new right adrenal mass with bilateral renal lesions.  Indeterminate L2 vertebral bodies sclerotic lesion.  Low-attenuation lesions in the kidneys measure up to 2.3 cm on the right and were similar to scan from July 2021. - CT CAP on 06/12/2020 did not show any evidence of nodal metastasis or metastatic disease in the chest.  Right adrenal mass measures 4.2 x 3.8 cm exerts mass-effect upon the posterior and right lateral wall of the IVC.  Right kidney masses measuring 2.3 x 2.3 cm and 1.9 x 1.7 cm suspicious for RCC.  Upper pole of the left kidney lesion 2.2 x 2.0 cm and medial cortex of the inferior pole measuring 2 x 2 cm and another interpolar left kidney lesion measuring 1.3 x 1.5 cm. - Bone scan on 06/13/2020 was negative. - Right adrenal biopsy consistent with prostatic adenocarcinoma - Degarelix on 07/16/2020, transition to Eligard 45 mg on 08/21/2020.   2.  Social/family history: - He lives at home with his daughter.  He walks with a cane. - He mows lawn with a riding mower.  He has a garden and gross vegetables.  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  Quit smoking 25 years ago. - No family history of malignancies that he is aware of.   PLAN:  1.  Metastatic prostate cancer to the right adrenal gland: - He is tolerating Eligard reasonably well.  Last injection  45 mg on 08/21/2020. - PSA has increased to 1.28 from 0.86 on 09/10/2020. - I have recommended repeating another PSA level in 6 weeks.  If it continues to go up, will consider  second-generation antiandrogens. - We reviewed CTAP with contrast from 10/25/2020 which showed right adrenal mass has decreased in size significantly.  Multifocal solid appearing renal masses observed bilaterally, suspicious for renal cell carcinoma. - Because of his advanced age, we have agreed not to pursue any biopsies. - CT scan also showed scattered foci of arterial enhancing lesions, although vague.  MRI of the liver with and without contrast was recommended for better characterization of the lesions.  He has a [employer] pacemaker which was placed in June 2014.  We we will find out if it is compatible with MRI. - His LFTs are completely normal.  We will likely repeat CT of the abdomen with and without contrast in 2 to 3 months.   2.  Left-sided back pain: - He has left-sided back pain in the line region.  Bone scan was negative for metastatic disease.  Last CT scan showed multilevel lumbar degenerative disc disease. - He is taking 1 tramadol tablet daily.  It is only lasting half a day.  I will increase it to twice daily.  3.  Urinary urgency: - He has urinary urgency for the past few months. - He has an appointment to see Dr. Junious Silk on 11/18/2020.   Orders placed this encounter:  No orders of the defined types were placed in this encounter.    Derrick Jack, MD [place] [phone]   I, Thana Ates, am acting as a scribe for Dr. Derek Long.  I, Derrick Jack MD, have reviewed the above documentation for accuracy and completeness, and I agree with the above.

## 2020-10-30 ENCOUNTER — Other Ambulatory Visit: Payer: Self-pay

## 2020-10-30 ENCOUNTER — Inpatient Hospital Stay (HOSPITAL_COMMUNITY): Payer: Medicare Other | Admitting: Hematology

## 2020-10-30 VITALS — BP 147/75 | HR 59 | Temp 97.7°F | Resp 16 | Wt 189.4 lb

## 2020-10-30 DIAGNOSIS — C641 Malignant neoplasm of right kidney, except renal pelvis: Secondary | ICD-10-CM | POA: Diagnosis not present

## 2020-10-30 DIAGNOSIS — D508 Other iron deficiency anemias: Secondary | ICD-10-CM

## 2020-10-30 DIAGNOSIS — C61 Malignant neoplasm of prostate: Secondary | ICD-10-CM | POA: Diagnosis not present

## 2020-10-30 MED ORDER — TRAMADOL HCL 50 MG PO TABS: 50.0000 mg | ORAL_TABLET | Freq: Two times a day (BID) | ORAL | 0 refills | Status: DC | PRN

## 2020-10-30 NOTE — Patient Instructions (Addendum)
[place] at [facility] Discharge Instructions  You were seen today by Dr. Delton Coombes. He went over your recent results and scans. You may begin taking tramadol 2 times a day as needed for pain. Dr. Delton Coombes will see you back in 6 weeks for labs and follow up.   Thank you for choosing [place] at [facility] to provide your oncology and hematology care.  To afford each patient quality time with our provider, please arrive at least 15 minutes before your scheduled appointment time.   If you have a lab appointment with the [facility] please come in thru the Main Entrance and check in at the main information desk  You need to re-schedule your appointment should you arrive 10 or more minutes late.  We strive to give you quality time with our providers, and arriving late affects you and other patients whose appointments are after yours.  Also, if you no show three or more times for appointments you may be dismissed from the clinic at the providers discretion.     Again, thank you for choosing [facility].  Our hope is that these requests will decrease the amount of time that you wait before being seen by our physicians.       _____________________________________________________________  Should you have questions after your visit to [facility], please contact our office at [phone] between the hours of 8:00 a.m. and 4:30 p.m.  Voicemails left after 4:00 p.m. will not be returned until the following business day.  For prescription refill requests, have your pharmacy contact our office and allow 72 hours.    Cancer Center Support Programs:   > Cancer Support Group  2nd Tuesday of the month 1pm-2pm, Journey Room

## 2020-10-31 ENCOUNTER — Telehealth (HOSPITAL_COMMUNITY): Payer: Self-pay | Admitting: *Deleted

## 2020-10-31 NOTE — Telephone Encounter (Signed)
Opened in error

## 2020-11-18 ENCOUNTER — Ambulatory Visit (INDEPENDENT_AMBULATORY_CARE_PROVIDER_SITE_OTHER): Payer: Medicare Other | Admitting: Urology

## 2020-11-18 ENCOUNTER — Other Ambulatory Visit: Payer: Self-pay

## 2020-11-18 VITALS — BP 141/73 | HR 80

## 2020-11-18 DIAGNOSIS — D4102 Neoplasm of uncertain behavior of left kidney: Secondary | ICD-10-CM

## 2020-11-18 DIAGNOSIS — Z191 Hormone sensitive malignancy status: Secondary | ICD-10-CM | POA: Diagnosis not present

## 2020-11-18 DIAGNOSIS — D4101 Neoplasm of uncertain behavior of right kidney: Secondary | ICD-10-CM

## 2020-11-18 DIAGNOSIS — R350 Frequency of micturition: Secondary | ICD-10-CM

## 2020-11-18 DIAGNOSIS — C61 Malignant neoplasm of prostate: Secondary | ICD-10-CM | POA: Diagnosis not present

## 2020-11-18 LAB — URINALYSIS, ROUTINE W REFLEX MICROSCOPIC
Bilirubin, UA: NEGATIVE
Glucose, UA: NEGATIVE
Ketones, UA: NEGATIVE
Leukocytes,UA: NEGATIVE
Nitrite, UA: NEGATIVE
Protein,UA: NEGATIVE
RBC, UA: NEGATIVE
Specific Gravity, UA: 1.01 (ref 1.005–1.030)
Urobilinogen, Ur: 0.2 mg/dL (ref 0.2–1.0)
pH, UA: 6 (ref 5.0–7.5)

## 2020-11-18 MED ORDER — OXYBUTYNIN CHLORIDE 5 MG PO TABS: 5.0000 mg | ORAL_TABLET | Freq: Every day | ORAL | 3 refills | Status: DC

## 2020-11-18 NOTE — Progress Notes (Signed)
11/18/2020 1:23 PM   Derrick Long Dec 19, 1926 [id number]  Referring provider: Derek Jack, [place],  [place] [address]  No chief complaint on file.   HPI:   New pt   1) frequency  - he has some urgency. PVR today 148 ml, 10/22, UA clear. Noc x 3. Sometimes can't get to bathroom in time.    2) Prostate cancer - under the care of Dr. Delton Coombes. Underwent brachytherapy in 2004. PSA rose to 4.5 and  then 10.5. CT 03/22 with L2 sclerotic lesion, right adrenal mass and bilateral renal mass (2.3 cm right and 2.2 cm left). Bone scan was negative. Adrenal mass bx c/w prostate adenocarcinoma.  ADT started May 2022. PSA down to 0.86 and 1.28. F/u CT 09/22 with decrease in size of adrenal mass. Stable renal mass.    3) renal mass - bilateral renal mass (2.3 cm right and 2.2 cm left) noted on eval for PCa on CT 05/22 and relatively stable on CT 09/22. Dr. Delton Coombes following.   Today, pt is seen for the above.   He retired from [employer]. He is on xarelto. Here with his daughter. He was in the [employer] and the [place] war.   PMH: Past Medical History:  Diagnosis Date   Acid reflux    Anemia    Ex-cigarette smoker    Gout    Hyperlipemia    Hypertension    Paroxysmal atrial fibrillation ([place]) 2014   Prostate cancer ([facility]) 6.23.2015   SEEDS 20 YEARS AGO   Prostate disease     Surgical History: Past Surgical History:  Procedure Laterality Date   LEFT HEART CATHETERIZATION WITH CORONARY ANGIOGRAM N/A 07/21/2012   Procedure: LEFT HEART CATHETERIZATION WITH CORONARY ANGIOGRAM;  Surgeon: Troy Sine, MD;  Location: [facility] CATH LAB;  Service: Cardiovascular;  Laterality: N/A;   PACEMAKER INSERTION     PERMANENT PACEMAKER INSERTION N/A 07/22/2012   Procedure: PERMANENT PACEMAKER INSERTION;  Surgeon: Sanda Klein, MD;  Location: [facility] CATH LAB;  Service: Cardiovascular;  Laterality: N/A;   PROSTATE SURGERY      Home Medications:  Allergies as of 11/18/2020   No Known  Allergies      Medication List        Accurate as of November 18, 2020  1:23 PM. If you have any questions, ask your nurse or doctor.          amLODipine 10 MG tablet Commonly known as: NORVASC Take 10 mg by mouth daily.   atorvastatin 20 MG tablet Commonly known as: LIPITOR Take 1 tablet (20 mg total) by mouth daily at 6 PM. What changed: when to take this   carvedilol 25 MG tablet Commonly known as: COREG Take 50 mg by mouth 2 (two) times daily with a meal.   Cholecalciferol 25 MCG (1000 UT) tablet Take 1,000 Units by mouth daily.   colchicine 0.6 MG tablet Take 1 tablet (0.6 mg total) by mouth 2 (two) times daily as needed (gout).   diclofenac sodium 1 % Gel Commonly known as: VOLTAREN Apply 2 g topically 4 (four) times daily. What changed:  when to take this reasons to take this   Ensure Take 237 mLs by mouth daily as needed (nutrition).   ferrous sulfate 324 MG Tbec Take 324 mg by mouth 2 (two) times daily.   fluticasone 50 MCG/ACT nasal spray Commonly known as: FLONASE Place 2 sprays into both nostrils daily.   HYDROcodone-acetaminophen 5-325 MG tablet Commonly known as:  NORCO/VICODIN Take 1 tablet by mouth every 6 (six) hours as needed for moderate pain.   omeprazole 20 MG capsule Commonly known as: PRILOSEC Take 20 mg by mouth daily as needed (acid reflux).   potassium chloride SA 20 MEQ tablet Commonly known as: KLOR-CON Take 20 mEq by mouth daily.   rivaroxaban 20 MG Tabs tablet Commonly known as: XARELTO Take 1 tablet (20 mg total) by mouth daily with supper.   tamsulosin 0.4 MG Caps capsule Commonly known as: FLOMAX Take 0.8 mg by mouth at bedtime.   traMADol 50 MG tablet Commonly known as: ULTRAM Take 1 tablet (50 mg total) by mouth 2 (two) times daily as needed for moderate pain or severe pain.        Allergies: No Known Allergies  Family History: No family history on file.  Social History:  reports that he quit smoking  about 18 years ago. He has never used smokeless tobacco. He reports that he does not drink alcohol and does not use drugs.   Physical Exam: BP (!) 141/73   Pulse 80   Constitutional:  Alert and oriented, No acute distress. HEENT: [place] AT, moist mucus membranes.  Trachea midline, no masses. Cardiovascular: No clubbing, cyanosis, or edema. Respiratory: Normal respiratory effort, no increased work of breathing. GI: Abdomen is soft, nontender, nondistended, no abdominal masses GU: No CVA tenderness Skin: No rashes, bruises or suspicious lesions. Neurologic: Grossly intact, no focal deficits, moving all 4 extremities. Psychiatric: Normal mood and affect.  Laboratory Data: Lab Results  Component Value Date   WBC 3.9 (L) 10/24/2020   HGB 11.7 (L) 10/24/2020   HCT 34.8 (L) 10/24/2020   MCV 99.4 10/24/2020   PLT 192 10/24/2020    Lab Results  Component Value Date   CREATININE 1.16 10/24/2020    Lab Results  Component Value Date   PSA Refused 12/22/2007   PSA 0.07 (L) 06/24/2007   PSA 0.10 05/25/2006    No results found for: TESTOSTERONE  Lab Results  Component Value Date   HGBA1C 5.6 07/19/2012    Urinalysis    Component Value Date/Time   COLORURINE YELLOW 05/03/2020 1025   APPEARANCEUR CLEAR 05/03/2020 1025   LABSPEC 1.013 05/03/2020 1025   PHURINE 5.0 05/03/2020 1025   GLUCOSEU NEGATIVE 05/03/2020 1025   [place] 05/03/2020 1025   HGBUR negative 06/24/2007 0833   BILIRUBINUR NEGATIVE 05/03/2020 1025   KETONESUR NEGATIVE 05/03/2020 1025   PROTEINUR NEGATIVE 05/03/2020 1025   UROBILINOGEN 1.0 07/29/2013 2032   NITRITE NEGATIVE 05/03/2020 1025   LEUKOCYTESUR NEGATIVE 05/03/2020 1025    Lab Results  Component Value Date   BACTERIA NONE SEEN 09/15/2019    Pertinent Imaging: CT scans  Results for orders placed in visit on 07/29/01  DG Abd 1 View  Narrative FINDINGS CLINICAL DATA:  PROSTATE CA. SINGLE VIEW ABDOMEN A NORMAL BOWEL GAS PATTERN IS  DEMONSTRATED.  MILD TO MODERATE SPUR FORMATION IS DEMONSTRATED AT MULTIPLE LEVELS OF THE LUMBAR SPINE AS WELL AS MINIMAL LEVOCONVEX LUMBAR ROTARY SCOLIOSIS.  ALSO NOTED IS BRIDGING OSSIFICATION OR CALCIFICATION BETWEEN THE LOWER LUMBAR SPINE AND LEFT ILIAC WING. A TRANSITIONAL LUMBOSACRAL VERTEBRA IS ALSO DEMONSTRATED.  MILD ATHEROMATOUS ARTERIAL CALCIFICATIONS ARE NOTED. IMPRESSION NO ACUTE ABNORMALITY AND NO EVIDENCE OF BONY METASTATIC DISEASE.  No results found for this or any previous visit.  No results found for this or any previous visit.  No results found for this or any previous visit.  Results for orders placed during the hospital encounter  of 08/01/13  US Renal  Narrative CLINICAL DATA:  Acute urinary retention  EXAM: RENAL/URINARY TRACT ULTRASOUND COMPLETE  COMPARISON:  04/14/2004  FINDINGS: Right Kidney:  Length: 10.2 cm. Complex 2.0 x 1.8 x 1.6 cm echogenic lesion, right kidney lower pole. We were not able the demonstrate definite blood flow within this lesion. Hypoechoic but minimally complex 2.6 x 2.2 x 1.9 cm lesion, right kidney lower pole.  Left Kidney:  Length: 1.3 cm. Mild prominence of renal sinus adipose tissue. Tiny probable cyst, left mid-upper kidney, 4 mm. Equivocal small mid kidney nonobstructive calculus.  Bladder:  Nondistended.  Catheter in urinary bladder.  IMPRESSION: 1. Complex 2 cm echogenic lesion, right kidney lower pole, in vicinity of prior enhancing nodule. In 2006 this lesion measured 1.4 cm. This may represent slow growth of a small renal cell carcinoma. 2. Minimally complex 2.6 cm right kidney lower pole lesion, most likely a complex cyst although technically nonspecific. 3. Equivocal small left mid kidney nonobstructive calculus. Mild renal sinus adipose tissue prominence on the left.   Electronically Signed By: Sherryl Barters M.D. On: 08/01/2013 15:54  No results found for this or any previous visit.  No results  found for this or any previous visit.  Results for orders placed during the hospital encounter of 05/03/20  CT Renal Stone Study  Narrative CLINICAL DATA:  Right-sided flank pain. Urinary frequency. History of prostate cancer.  EXAM: CT ABDOMEN AND PELVIS WITHOUT CONTRAST  TECHNIQUE: Multidetector CT imaging of the abdomen and pelvis was performed following the standard protocol without IV contrast.  COMPARISON:  09/15/2019 and 09/15/2019.  FINDINGS: Lower chest: Millimetric nodule in the posterior right lower lobe (5/57), unchanged. Mild volume loss in the lingula and both lower lobes. Heart is enlarged. Atherosclerotic calcification of the aorta, aortic valve and coronary arteries. No pericardial or pleural effusion. Distal esophagus is unremarkable.  Hepatobiliary: Visualized portions of the liver is unremarkable. Cholecystectomy. Stable associated biliary ductal dilatation.  Pancreas: Negative.  Spleen: Negative.  Adrenals/Urinary Tract: New right adrenal mass measures 2.3 x 3.2 cm and 35 Hounsfield units. Left adrenal gland is unremarkable. Low-attenuation lesions in the kidneys measure up to 2.3 cm on the right and were better evaluated on 09/16/2019. Subcentimeter hyper dense lesions are too small to characterize. No urinary stones. Ureters are decompressed. Bladder is grossly unremarkable.  Stomach/Bowel: Stomach and small bowel are unremarkable. Right hemicolectomy. Colon is otherwise unremarkable.  Vascular/Lymphatic: Atherosclerotic calcification of the aorta. Bilateral common iliac artery aneurysms, 2.2 cm in the right and 2.0 cm on the left. 9 mm right external iliac lymph node (3/67) has decreased in size from 1.4 cm on 09/15/2019. 9 mm lymph node in the sigmoid mesentery (3/69) is stable when remeasured. No additional pathologically enlarged lymph nodes.  Reproductive: Brachytherapy seeds in the prostate.  Other: No free fluid. Mesenteries and  peritoneum are otherwise unremarkable.  Musculoskeletal: Small sclerotic lesions in the pubic bones, unchanged from 08/09/2013. A sclerotic lesion in the right L2 vertebral body is stable from the most recent prior examination but is new from 2015. Degenerative changes in the spine and hips, right greater than left. Levoconvex scoliosis.  IMPRESSION: 1. New right adrenal mass, highly worrisome for metastatic disease. 2. Bilateral renal lesions, characterized as enhancing and therefore malignant on 09/16/2019. 3. Indeterminate L2 vertebral body sclerotic lesion. 4. Interval decrease in size of a right external iliac lymph node. 9 mm lymph node in the sigmoid mesentery is stable. 5. Aortic atherosclerosis (ICD10-I70.0). Coronary artery calcification.  Bilateral common iliac artery aneurysms.   Electronically Signed By: Lorin Picket M.D. On: 05/03/2020 10:19   Assessment & Plan:    1. Urine frequency Disc with patient and daughter the nature r/b/a to adding a low dose anticholinergic. Also disc proper fluid intake and importance of staying well hydrated even if it means voiding with a pad or diaper. Start oxyb 5 mg po QHS.   - Urinalysis, Routine w reflex microscopic - BLADDER SCAN AMB NON-IMAGING  2. PCa - under management with Dr. Delton Coombes.   3. Renal mass - disc these masses can be benign or malignant. Disc surv, ablation, partial, expected growth rate, metastatic potential, etc. Daughter says they've "already covered all that" and will continue surveillance.   No follow-ups on file.  Festus Aloe, MD  [facility]  [address] [place], [place] [address] [phone]

## 2020-11-18 NOTE — Progress Notes (Signed)
post void residual=148  Urological Symptom Review  Patient is experiencing the following symptoms: Frequent urination Hard to postpone urination Get up at night to urinate Leakage of urine Stream starts and stops Have to strain to urinate   Review of Systems  Gastrointestinal (upper)  : Negative for upper GI symptoms  Gastrointestinal (lower) : Negative for lower GI symptoms  Constitutional : Weight loss  Skin: Negative for skin symptoms  Eyes: Negative for eye symptoms  Ear/Nose/Throat : Negative for Ear/Nose/Throat symptoms  Hematologic/Lymphatic: Negative for Hematologic/Lymphatic symptoms  Cardiovascular : Leg swelling  Respiratory : Negative for respiratory symptoms  Endocrine: Negative for endocrine symptoms  Musculoskeletal: Back pain Joint pain  Neurological: Negative for neurological symptoms  Psychologic: Negative for psychiatric symptoms

## 2020-12-03 ENCOUNTER — Encounter (HOSPITAL_COMMUNITY): Payer: Self-pay | Admitting: *Deleted

## 2020-12-03 ENCOUNTER — Emergency Department (HOSPITAL_COMMUNITY)
Admission: EM | Admit: 2020-12-03 | Discharge: 2020-12-03 | Disposition: A | Discharge status: Home / Self Care | Payer: No Typology Code available for payment source | Attending: Emergency Medicine | Admitting: Emergency Medicine

## 2020-12-03 DIAGNOSIS — Z950 Presence of cardiac pacemaker: Secondary | ICD-10-CM | POA: Insufficient documentation

## 2020-12-03 DIAGNOSIS — Z7901 Long term (current) use of anticoagulants: Secondary | ICD-10-CM | POA: Diagnosis not present

## 2020-12-03 DIAGNOSIS — Z79899 Other long term (current) drug therapy: Secondary | ICD-10-CM | POA: Diagnosis not present

## 2020-12-03 DIAGNOSIS — R300 Dysuria: Secondary | ICD-10-CM | POA: Insufficient documentation

## 2020-12-03 DIAGNOSIS — Z8546 Personal history of malignant neoplasm of prostate: Secondary | ICD-10-CM | POA: Diagnosis not present

## 2020-12-03 DIAGNOSIS — R339 Retention of urine, unspecified: Secondary | ICD-10-CM

## 2020-12-03 DIAGNOSIS — Z87891 Personal history of nicotine dependence: Secondary | ICD-10-CM | POA: Diagnosis not present

## 2020-12-03 DIAGNOSIS — I480 Paroxysmal atrial fibrillation: Secondary | ICD-10-CM | POA: Diagnosis not present

## 2020-12-03 DIAGNOSIS — I10 Essential (primary) hypertension: Secondary | ICD-10-CM | POA: Insufficient documentation

## 2020-12-03 DIAGNOSIS — R7989 Other specified abnormal findings of blood chemistry: Secondary | ICD-10-CM | POA: Diagnosis not present

## 2020-12-03 DIAGNOSIS — R350 Frequency of micturition: Secondary | ICD-10-CM | POA: Diagnosis present

## 2020-12-03 DIAGNOSIS — R140 Abdominal distension (gaseous): Secondary | ICD-10-CM | POA: Diagnosis not present

## 2020-12-03 LAB — BASIC METABOLIC PANEL
Anion gap: 6 (ref 5–15)
BUN: 24 mg/dL — ABNORMAL HIGH (ref 8–23)
CO2: 27 mmol/L (ref 22–32)
Calcium: 9.3 mg/dL (ref 8.9–10.3)
Chloride: 102 mmol/L (ref 98–111)
Creatinine, Ser: 1.38 mg/dL — ABNORMAL HIGH (ref 0.61–1.24)
GFR, Estimated: 48 mL/min — ABNORMAL LOW (ref >60)
Glucose, Bld: 97 mg/dL (ref 70–99)
Potassium: 4.5 mmol/L (ref 3.5–5.1)
Sodium: 135 mmol/L (ref 135–145)

## 2020-12-03 LAB — CBC
HCT: 36.6 % — ABNORMAL LOW (ref 39.0–52.0)
Hemoglobin: 11.9 g/dL — ABNORMAL LOW (ref 13.0–17.0)
MCH: 32.8 pg (ref 26.0–34.0)
MCHC: 32.5 g/dL (ref 30.0–36.0)
MCV: 100.8 fL — ABNORMAL HIGH (ref 80.0–100.0)
Platelets: 214 10*3/uL (ref 150–400)
RBC: 3.63 MIL/uL — ABNORMAL LOW (ref 4.22–5.81)
RDW: 12.4 % (ref 11.5–15.5)
WBC: 3.8 10*3/uL — ABNORMAL LOW (ref 4.0–10.5)
nRBC: 0 % (ref 0.0–0.2)

## 2020-12-03 LAB — URINALYSIS, ROUTINE W REFLEX MICROSCOPIC
Bilirubin Urine: NEGATIVE
Glucose, UA: NEGATIVE mg/dL
Hgb urine dipstick: NEGATIVE
Ketones, ur: NEGATIVE mg/dL
Leukocytes,Ua: NEGATIVE
Nitrite: NEGATIVE
Protein, ur: NEGATIVE mg/dL
Specific Gravity, Urine: 1.01 (ref 1.005–1.030)
pH: 5 (ref 5.0–8.0)

## 2020-12-03 MED ORDER — SILODOSIN 8 MG PO CAPS: 8.0000 mg | ORAL_CAPSULE | Freq: Every day | ORAL | 0 refills | Status: DC

## 2020-12-03 NOTE — ED Notes (Signed)
Pt here for urinary incontinence.

## 2020-12-03 NOTE — ED Triage Notes (Signed)
Frequent urination, denies pain

## 2020-12-03 NOTE — ED Provider Notes (Signed)
[facility] EMERGENCY DEPARTMENT Provider Note   CSN: [id number] Arrival date & time: 12/03/20  1214     History Chief Complaint  Patient presents with   Dysuria    Derrick Long is a 87 y.o. male with a history including paroxysmal A. fib, hypertension, hyperlipidemia and history of prostate cancer followed by both Dr. Delton Coombes and Dr. Junious Silk of urology, presenting for evaluation of persistent increasing urinary frequency along with urgency and increasing episodes of incontinence both during the day and overnight as well.  He does wear depends at night.  His daughter at the bedside is wondering whether a Foley catheter would help him.Marland Kitchen  He was seen for this problem about 2 weeks ago by his urologist at which time he was started on oxybutynin 5 mg nightly.  Patient states his symptoms have worsened since starting this medication.  He denies painful urination, hematuria, fevers, chills, nausea or vomiting.  The history is provided by the patient.      Past Medical History:  Diagnosis Date   Acid reflux    Anemia    Ex-cigarette smoker    Gout    Hyperlipemia    Hypertension    Paroxysmal atrial fibrillation ([place]) 2014   Prostate cancer ([place]) 6.23.2015   SEEDS 20 YEARS AGO   Prostate disease     Patient Active Problem List   Diagnosis Date Noted   Prostate cancer ([place]) 07/10/2020   Bilateral kidney masses 06/06/2020   Adrenal mass, right ([place]) 06/06/2020   Acute colitis 09/16/2019   Abdominal pain 09/16/2019   Nausea 09/16/2019   Hypoalbuminemia 09/16/2019   Pelvic lymphadenopathy 09/16/2019   History of atrial fibrillation 09/16/2019   Colitis 09/16/2019   Paroxysmal atrial fibrillation ([facility]) 09/02/2012   HTN (hypertension) 09/02/2012   Pacemaker 08/03/2012   Chest pain 07/31/2012   Leukocytosis, unspecified 07/31/2012   AKI (acute kidney injury) ([place]) 07/31/2012   Gout 07/31/2012   First degree AV block, PR interval 370 ms 07/19/2012   Chest pain,  atypical 07/19/2012   Acute renal insufficiency 07/19/2012   Hypokalemia 07/19/2012   Syncope 07/19/2012   Bradycardia 07/19/2012   LUMBAR STRAIN 09/10/2008   ANEMIA, IRON DEFICIENCY NOS 08/16/2008   OSTEOARTHRITIS, MODERATE 09/21/2007   INSOMNIA 06/24/2007   Dyslipidemia 04/05/2007   DENTAL CARIES 10/08/2006   LATERAL MENISCUS TEAR, RIGHT 04/19/2006   MIGRAINE HEADACHE 12/18/2005   Essential hypertension 12/18/2005   GERD 12/18/2005   RENAL CALCULUS 12/18/2005   BENIGN PROSTATIC HYPERTROPHY, WITH OBSTRUCTION 12/18/2005   ARTHRITIS 12/18/2005   LOW BACK PAIN 12/18/2005   OSTEOPENIA 12/18/2005   COLON CANCER, HX OF 12/18/2005   PROSTATE CANCER, HX OF 12/18/2005    Past Surgical History:  Procedure Laterality Date   LEFT HEART CATHETERIZATION WITH CORONARY ANGIOGRAM N/A 07/21/2012   Procedure: LEFT HEART CATHETERIZATION WITH CORONARY ANGIOGRAM;  Surgeon: Troy Sine, MD;  Location: [facility] CATH LAB;  Service: Cardiovascular;  Laterality: N/A;   PACEMAKER INSERTION     PERMANENT PACEMAKER INSERTION N/A 07/22/2012   Procedure: PERMANENT PACEMAKER INSERTION;  Surgeon: Sanda Klein, MD;  Location: [facility] CATH LAB;  Service: Cardiovascular;  Laterality: N/A;   PROSTATE SURGERY         No family history on file.  Social History   Tobacco Use   Smoking status: Former    Types: Cigarettes    Quit date: 07/20/2002    Years since quitting: 18.3   Smokeless tobacco: Never  Vaping Use   Vaping Use: Never  used  Substance Use Topics   Alcohol use: No   Drug use: No    Home Medications Prior to Admission medications   Medication Sig Start Date End Date Taking? Authorizing Provider  silodosin (RAPAFLO) 8 MG CAPS capsule Take 1 capsule (8 mg total) by mouth at bedtime. 12/03/20  Yes Naif Alabi, Almyra Free, PA-C  amLODipine (NORVASC) 10 MG tablet Take 10 mg by mouth daily.    [provider]  atorvastatin (LIPITOR) 20 MG tablet Take 1 tablet (20 mg total) by mouth daily at 6 PM. Patient  taking differently: Take 20 mg by mouth at bedtime. 07/23/12   Lyda Jester M, PA-C  carvedilol (COREG) 25 MG tablet Take 50 mg by mouth 2 (two) times daily with a meal.    [provider]  Cholecalciferol 25 MCG (1000 UT) tablet Take 1,000 Units by mouth daily.    [provider]  colchicine 0.6 MG tablet Take 1 tablet (0.6 mg total) by mouth 2 (two) times daily as needed (gout). 02/18/17   Margarita Mail, PA-C  diclofenac sodium (VOLTAREN) 1 % GEL Apply 2 g topically 4 (four) times daily. Patient taking differently: Apply 2 g topically 4 (four) times daily as needed (pain). 06/19/17   Rancour, Annie Main, MD  Ensure (ENSURE) Take 237 mLs by mouth daily as needed (nutrition).    [provider]  ferrous sulfate 324 MG TBEC Take 324 mg by mouth 2 (two) times daily.    [provider]  fluticasone (FLONASE) 50 MCG/ACT nasal spray Place 2 sprays into both nostrils daily. 02/15/16   Ward, Delice Bison, DO  HYDROcodone-acetaminophen (NORCO/VICODIN) 5-325 MG tablet Take 1 tablet by mouth every 6 (six) hours as needed for moderate pain. 05/03/20   Milton Ferguson, MD  omeprazole (PRILOSEC) 20 MG capsule Take 20 mg by mouth daily as needed (acid reflux).    [provider]  potassium chloride SA (KLOR-CON) 20 MEQ tablet Take 20 mEq by mouth daily. 02/28/20   [provider]  rivaroxaban (XARELTO) 20 MG TABS tablet Take 1 tablet (20 mg total) by mouth daily with supper. 10/30/13   Croitoru, Mihai, MD  traMADol (ULTRAM) 50 MG tablet Take 1 tablet (50 mg total) by mouth 2 (two) times daily as needed for moderate pain or severe pain. 10/30/20   Derek Jack, MD    Allergies    Patient has no known allergies.  Review of Systems   Review of Systems  Constitutional:  Negative for chills and fever.  HENT:  Negative for congestion and sore throat.   Eyes: Negative.   Respiratory:  Negative for chest tightness and shortness of breath.   Cardiovascular:   Negative for chest pain.  Gastrointestinal:  Negative for abdominal pain and nausea.  Genitourinary:  Positive for enuresis, frequency and urgency.  Musculoskeletal:  Negative for arthralgias, joint swelling and neck pain.  Skin: Negative.  Negative for rash and wound.  Neurological:  Negative for dizziness, weakness, light-headedness, numbness and headaches.  Psychiatric/Behavioral: Negative.    All other systems reviewed and are negative.  Physical Exam Updated Vital Signs BP (!) 152/75 (BP Location: Right Wrist)   Pulse 62   Temp (!) 97.5 F (36.4 C) (Oral)   Resp 16   SpO2 98%   Physical Exam Vitals and nursing note reviewed.  Constitutional:      Appearance: He is well-developed.  HENT:     Head: Normocephalic and atraumatic.  Eyes:     Conjunctiva/sclera: Conjunctivae normal.  Cardiovascular:  Rate and Rhythm: Normal rate and regular rhythm.     Heart sounds: Normal heart sounds.  Pulmonary:     Effort: Pulmonary effort is normal.     Breath sounds: Normal breath sounds. No wheezing.  Abdominal:     General: Bowel sounds are normal. There is distension.     Palpations: Abdomen is soft.     Tenderness: There is no abdominal tenderness. There is no guarding.     Comments: Fullness suprapubic region.  Musculoskeletal:        General: Normal range of motion.     Cervical back: Normal range of motion.  Skin:    General: Skin is warm and dry.  Neurological:     Mental Status: He is alert.    ED Results / Procedures / Treatments   Labs (all labs ordered are listed, but only abnormal results are displayed) Labs Reviewed  BASIC METABOLIC PANEL - Abnormal; Notable for the following components:      Result Value   BUN 24 (*)    Creatinine, Ser 1.38 (*)    GFR, Estimated 48 (*)    All other components within normal limits  CBC - Abnormal; Notable for the following components:   WBC 3.8 (*)    RBC 3.63 (*)    Hemoglobin 11.9 (*)    HCT 36.6 (*)    MCV 100.8  (*)    All other components within normal limits  URINALYSIS, ROUTINE W REFLEX MICROSCOPIC    EKG None  Radiology No results found.  Procedures Procedures   Medications Ordered in ED Medications - No data to display  ED Course  I have reviewed the triage vital signs and the nursing notes.  Pertinent labs & imaging results that were available during my care of the patient were reviewed by me and considered in my medical decision making (see chart for details).    MDM Rules/Calculators/A&P                           Patient's labs are reassuring.  Although he does have an elevated creatinine of 1.38 this is a chronic range for him.  His creatinine was 1.313 months ago.  He does not have a UTI.  We were able to get a post void residual of 419 cc.  This was after being able to urinate 30 cc.  Additionally review of chart revealing for a CT abdomen and pelvis completed on October 25, 2020.  It appears he has a new right and left adrenal and renal mass concerning for metastatic disease.  Bladder was unremarkable, seed implants noted in the prostate gland.    Call placed to urology to discuss these findings.  I suspect he will benefit from Foley catheter but want to get their input prior to proceeding with this.  Discussed with Dr. Alyson Ingles who advised not to place a foley as he is not having pain, but to stop the oxybutynin and the flomax, starting Rapaflo 8 mg qhs with close urology f/u.  Discussed with pt who agrees with and understands this plan.   Return precautions discussed.    Final Clinical Impression(s) / ED Diagnoses Final diagnoses:  Urinary retention  Urinary frequency    Rx / DC Orders ED Discharge Orders          Ordered    silodosin (RAPAFLO) 8 MG CAPS capsule  Daily at bedtime        12/03/20 1846  Evalee Jefferson, PA-C 12/03/20 1849    Milton Ferguson, MD 12/04/20 [phone]

## 2020-12-03 NOTE — Discharge Instructions (Addendum)
As discussed your urologist wants you to stop taking your oxybutynin and your Flomax.  He wants you to start taking a new medicine called Rapaflo which should help your urinary symptoms significantly.  Do not stop your medications until you have the Rapaflo to start this new medicine as he does not want you to go a day without any medication for this symptom.  Reasons to return here is if you develop pain in your bladder or fever.  Call Dr. Junious Silk for close office follow-up to evaluate your symptoms with this medication change.

## 2020-12-05 ENCOUNTER — Telehealth: Payer: Self-pay

## 2020-12-05 NOTE — Telephone Encounter (Signed)
Pt's daughter Needing to discuss medications prescribed that are no working.  Please advise.  Call back:  [phone]  Thanks, Helene Kelp

## 2020-12-05 NOTE — Telephone Encounter (Signed)
Left message to return call to office.   ER visit note from 10/18 Discussed with Dr. Alyson Ingles who advised not to place a foley as he is not having pain, but to stop the oxybutynin and the flomax, starting Rapaflo 8 mg qhs with close urology f/u.  Discussed with pt who agrees with and understands this plan.    Return precautions discussed.

## 2020-12-05 NOTE — Telephone Encounter (Signed)
Daughter returned call. Reviewed with daughter Rapaflo medication was just recently prescribed and would need to give time for medication to work for OAB symptoms. Patient daughter voiced understanding.

## 2020-12-10 NOTE — Progress Notes (Signed)
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    CANCER STAGING: Cancer Staging Prostate cancer [facility]) Staging form: Prostate, AJCC 8th Edition - Clinical stage from 07/10/2020: Stage IVB (cTX, cNX, pM1c, PSA: 10.2) - Unsigned   INTERVAL HISTORY:  [name], a 87 y.o. 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 Derrick Long was last seen on 10/30/2020.   Today he reports feeling fair. He continues to have right-sided back pain for which he takes about 1 tramadol tablet daily as needed. He denies hot flashes. He reports poor appetite and fatigue.  REVIEW OF SYSTEMS:  Review of Systems  Constitutional:  Positive for appetite change (50%) and fatigue (50%).  Endocrine: Negative for hot flashes.  Genitourinary:  Positive for frequency.   All other systems reviewed and are negative.  PAST MEDICAL/SURGICAL HISTORY:  Past Medical History:  Diagnosis Date   Acid reflux    Anemia    Ex-cigarette smoker    Gout    Hyperlipemia    Hypertension    Paroxysmal atrial fibrillation ([place]) 2014   Prostate cancer ([place]) 6.23.2015   SEEDS 20 YEARS AGO   Prostate disease    Past Surgical History:  Procedure Laterality Date   LEFT HEART CATHETERIZATION WITH CORONARY ANGIOGRAM N/A 07/21/2012   Procedure: LEFT HEART CATHETERIZATION WITH CORONARY ANGIOGRAM;  Surgeon: Troy Sine, MD;  Location: [facility] CATH LAB;  Service: Cardiovascular;  Laterality: N/A;   PACEMAKER INSERTION     PERMANENT PACEMAKER INSERTION N/A 07/22/2012    Procedure: PERMANENT PACEMAKER INSERTION;  Surgeon: Sanda Klein, MD;  Location: [facility] CATH LAB;  Service: Cardiovascular;  Laterality: N/A;   PROSTATE SURGERY      SOCIAL HISTORY:  Social History   Socioeconomic History   Marital status: Married    Spouse name: Not on file   Number of children: Not on file   Years of education: Not on file   Highest education level: Not on file  Occupational History   Not on file  Tobacco Use   Smoking status: Former    Types: Cigarettes    Quit date: 07/20/2002    Years since quitting: 18.4   Smokeless tobacco: Never  Vaping Use   Vaping Use: Never used  Substance and Sexual Activity   Alcohol use: No   Drug use: No   Sexual activity: Not on file  Other Topics Concern   Not on file  Social History Narrative   Not on file   Social Determinants of Health   Financial Resource Strain: Low Risk    Difficulty of Paying Living Expenses: Not hard at all  Food Insecurity: No Food Insecurity   Worried About Charity fundraiser in the Last Year: Never true   [place] in the Last Year: Never true  Transportation Needs: No Transportation Needs   Lack of Transportation (Medical): No   Lack of Transportation (Non-Medical): No  Physical Activity: Insufficiently Active   Days of Exercise per Week: 3 days   Minutes of Exercise per Session:  30 min  Stress: No Stress Concern Present   Feeling of Stress : Not at all  Social Connections: Moderately Integrated   Frequency of Communication with Friends and Family: More than three times a week   Frequency of Social Gatherings with Friends and Family: More than three times a week   Attends Religious Services: More than 4 times per year   Active Member of Clubs or Organizations: No   Attends Archivist Meetings: 1 to 4 times per year   Marital Status: Widowed  Human resources officer Violence: Not At Risk   Fear of Current or Ex-Partner: No   Emotionally Abused: No   Physically Abused: No    Sexually Abused: No    FAMILY HISTORY:  No family history on file.  CURRENT MEDICATIONS:  Current Outpatient Medications  Medication Sig Dispense Refill   amLODipine (NORVASC) 10 MG tablet Take 10 mg by mouth daily.     atorvastatin (LIPITOR) 20 MG tablet Take 1 tablet (20 mg total) by mouth daily at 6 PM. (Patient taking differently: Take 20 mg by mouth at bedtime.) 30 tablet 5   carvedilol (COREG) 25 MG tablet Take 50 mg by mouth 2 (two) times daily with a meal.     Cholecalciferol 25 MCG (1000 UT) tablet Take 1,000 Units by mouth daily.     colchicine 0.6 MG tablet Take 1 tablet (0.6 mg total) by mouth 2 (two) times daily as needed (gout). 30 tablet 0   diclofenac sodium (VOLTAREN) 1 % GEL Apply 2 g topically 4 (four) times daily. (Patient taking differently: Apply 2 g topically 4 (four) times daily as needed (pain).) 100 g 0   Ensure (ENSURE) Take 237 mLs by mouth daily as needed (nutrition).     ferrous sulfate 324 MG TBEC Take 324 mg by mouth 2 (two) times daily.     fluticasone (FLONASE) 50 MCG/ACT nasal spray Place 2 sprays into both nostrils daily. 16 g 2   HYDROcodone-acetaminophen (NORCO/VICODIN) 5-325 MG tablet Take 1 tablet by mouth every 6 (six) hours as needed for moderate pain. 20 tablet 0   omeprazole (PRILOSEC) 20 MG capsule Take 20 mg by mouth daily as needed (acid reflux).     potassium chloride SA (KLOR-CON) 20 MEQ tablet Take 20 mEq by mouth daily.     rivaroxaban (XARELTO) 20 MG TABS tablet Take 1 tablet (20 mg total) by mouth daily with supper. 20 tablet 0   silodosin (RAPAFLO) 8 MG CAPS capsule Take 1 capsule (8 mg total) by mouth at bedtime. 30 capsule 0   traMADol (ULTRAM) 50 MG tablet Take 1 tablet (50 mg total) by mouth 2 (two) times daily as needed for moderate pain or severe pain. 60 tablet 0   No current facility-administered medications for this visit.    ALLERGIES:  No Known Allergies  PHYSICAL EXAM:  Performance status (ECOG): 1 - Symptomatic but  completely ambulatory  There were no vitals filed for this visit. Wt Readings from Last 3 Encounters:  10/30/20 189 lb 6 oz (85.9 kg)  08/21/20 188 lb 0.8 oz (85.3 kg)  08/13/20 186 lb 6.4 oz (84.6 kg)   Physical Exam Vitals reviewed.  Constitutional:      Appearance: Normal appearance.  Cardiovascular:     Rate and Rhythm: Normal rate and regular rhythm.     Pulses: Normal pulses.     Heart sounds: Normal heart sounds.  Pulmonary:     Effort: Pulmonary effort is normal.  Breath sounds: Normal breath sounds.  Musculoskeletal:     Thoracic back: No tenderness.     Lumbar back: No tenderness.  Neurological:     General: No focal deficit present.     Mental Status: He is alert and oriented to person, place, and time.  Psychiatric:        Mood and Affect: Mood normal.        Behavior: Behavior normal.     LABORATORY DATA:  I have reviewed the labs as listed.  CBC Latest Ref Rng & Units 12/03/2020 10/24/2020 09/10/2020  WBC 4.0 - 10.5 K/uL 3.8(L) 3.9(L) 4.5  Hemoglobin 13.0 - 17.0 g/dL 11.9(L) 11.7(L) 12.5(L)  Hematocrit 39.0 - 52.0 % 36.6(L) 34.8(L) 36.6(L)  Platelets 150 - 400 K/uL 214 192 198   CMP Latest Ref Rng & Units 12/03/2020 10/24/2020 09/10/2020  Glucose 70 - 99 mg/dL 97 95 87  BUN 8 - 23 mg/dL 24(H) 26(H) 26(H)  Creatinine 0.61 - 1.24 mg/dL 1.38(H) 1.16 1.21  Sodium 135 - 145 mmol/L 135 135 134(L)  Potassium 3.5 - 5.1 mmol/L 4.5 4.5 4.5  Chloride 98 - 111 mmol/L 102 104 102  CO2 22 - 32 mmol/L 27 26 25   Calcium 8.9 - 10.3 mg/dL 9.3 9.0 8.9  Total Protein 6.5 - 8.1 g/dL - 7.2 7.8  Total Bilirubin 0.3 - 1.2 mg/dL - 0.6 0.7  Alkaline Phos 38 - 126 U/L - 68 69  AST 15 - 41 U/L - 24 26  ALT 0 - 44 U/L - 22 24    DIAGNOSTIC IMAGING:  I have independently reviewed the scans and discussed with the patient. No results found.   ASSESSMENT:  1.  Metastatic prostate cancer to the right adrenal gland: -Presentation to the ER with worsening right-sided back pain,  which has been on and off for the last couple of years. - Some decrease in appetite with 6 pound weight loss in the last 6 months.  No hematuria or other B symptoms. - CT renal study on 05/03/2020 showed new right adrenal mass with bilateral renal lesions.  Indeterminate L2 vertebral bodies sclerotic lesion.  Low-attenuation lesions in the kidneys measure up to 2.3 cm on the right and were similar to scan from July 2021. - CT CAP on 06/12/2020 did not show any evidence of nodal metastasis or metastatic disease in the chest.  Right adrenal mass measures 4.2 x 3.8 cm exerts mass-effect upon the posterior and right lateral wall of the IVC.  Right kidney masses measuring 2.3 x 2.3 cm and 1.9 x 1.7 cm suspicious for RCC.  Upper pole of the left kidney lesion 2.2 x 2.0 cm and medial cortex of the inferior pole measuring 2 x 2 cm and another interpolar left kidney lesion measuring 1.3 x 1.5 cm. - Bone scan on 06/13/2020 was negative. - Right adrenal biopsy consistent with prostatic adenocarcinoma - Degarelix on 07/16/2020, transition to Eligard 45 mg on 08/21/2020.   2.  Social/family history: - He lives at home with his daughter.  He walks with a cane. - He mows lawn with a riding mower.  He has a garden and gross vegetables.  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  Quit smoking 25 years ago. - No family history of malignancies that he is aware of.   PLAN:  1.  Metastatic prostate cancer to the right adrenal gland: - Eligard last injection 45 mg on 08/21/2020. - Last PSA has increased to 1.28 (10/24/2020). - He reports  decreased appetite and energy levels. - We reviewed labs from today which shows normal LFTs.  Ferritin is 224.  Hemoglobin improved to 12.7. - We cannot do MRI of the liver given indeterminate lesions on CT scan due to pacemaker. - We will consider repeat imaging in December. - If the PSA continues to rise today, we talked about initiating him on Abiraterone and prednisone. - We will  likely start him at 500 mg Abiraterone daily and increase the dose as tolerated.  We talked about the common side effects including but not limited to hypertension, hypokalemia, fatigue and hot flashes. - If he starts on Abiraterone, I will see him back in 2 to 3 weeks after the start.   2.  Right sided back pain: - Right loin pain stable.  He is requiring tramadol once daily.  3.  Urinary urgency: - He was evaluated by Dr. Junious Silk and is on Rapaflo.  Symptoms improved.  4.  Decreased appetite: - We will start him on Marinol 2.5 mg twice daily and titrate up if needed.   Orders placed this encounter:  No orders of the defined types were placed in this encounter.    Derek Jack, MD [place] [phone]   I, Derrick Long, am acting as a scribe for Dr. Derek Jack.  I, Derek Jack MD, have reviewed the above documentation for accuracy and completeness, and I agree with the above.

## 2020-12-11 ENCOUNTER — Inpatient Hospital Stay (HOSPITAL_COMMUNITY): Payer: Medicare Other | Attending: Hematology | Admitting: Hematology

## 2020-12-11 ENCOUNTER — Other Ambulatory Visit: Payer: Self-pay

## 2020-12-11 ENCOUNTER — Inpatient Hospital Stay (HOSPITAL_COMMUNITY): Payer: Medicare Other

## 2020-12-11 VITALS — BP 150/78 | HR 62 | Temp 97.7°F | Resp 18 | Wt 186.3 lb

## 2020-12-11 DIAGNOSIS — D508 Other iron deficiency anemias: Secondary | ICD-10-CM | POA: Diagnosis not present

## 2020-12-11 DIAGNOSIS — I480 Paroxysmal atrial fibrillation: Secondary | ICD-10-CM | POA: Insufficient documentation

## 2020-12-11 DIAGNOSIS — C7971 Secondary malignant neoplasm of right adrenal gland: Secondary | ICD-10-CM | POA: Diagnosis not present

## 2020-12-11 DIAGNOSIS — C61 Malignant neoplasm of prostate: Secondary | ICD-10-CM | POA: Insufficient documentation

## 2020-12-11 DIAGNOSIS — R3915 Urgency of urination: Secondary | ICD-10-CM | POA: Diagnosis not present

## 2020-12-11 LAB — COMPREHENSIVE METABOLIC PANEL
ALT: 27 U/L (ref 0–44)
AST: 27 U/L (ref 15–41)
Albumin: 3.8 g/dL (ref 3.5–5.0)
Alkaline Phosphatase: 64 U/L (ref 38–126)
Anion gap: 5 (ref 5–15)
BUN: 26 mg/dL — ABNORMAL HIGH (ref 8–23)
CO2: 25 mmol/L (ref 22–32)
Calcium: 9.4 mg/dL (ref 8.9–10.3)
Chloride: 104 mmol/L (ref 98–111)
Creatinine, Ser: 1.23 mg/dL (ref 0.61–1.24)
GFR, Estimated: 55 mL/min — ABNORMAL LOW (ref >60)
Glucose, Bld: 88 mg/dL (ref 70–99)
Potassium: 4.4 mmol/L (ref 3.5–5.1)
Sodium: 134 mmol/L — ABNORMAL LOW (ref 135–145)
Total Bilirubin: 1.1 mg/dL (ref 0.3–1.2)
Total Protein: 8 g/dL (ref 6.5–8.1)

## 2020-12-11 LAB — CBC WITH DIFFERENTIAL/PLATELET
Abs Immature Granulocytes: 0.01 10*3/uL (ref 0.00–0.07)
Basophils Absolute: 0 10*3/uL (ref 0.0–0.1)
Basophils Relative: 1 %
Eosinophils Absolute: 0.1 10*3/uL (ref 0.0–0.5)
Eosinophils Relative: 3 %
HCT: 37.6 % — ABNORMAL LOW (ref 39.0–52.0)
Hemoglobin: 12.7 g/dL — ABNORMAL LOW (ref 13.0–17.0)
Immature Granulocytes: 0 %
Lymphocytes Relative: 34 %
Lymphs Abs: 1.4 10*3/uL (ref 0.7–4.0)
MCH: 34.1 pg — ABNORMAL HIGH (ref 26.0–34.0)
MCHC: 33.8 g/dL (ref 30.0–36.0)
MCV: 101.1 fL — ABNORMAL HIGH (ref 80.0–100.0)
Monocytes Absolute: 0.6 10*3/uL (ref 0.1–1.0)
Monocytes Relative: 15 %
Neutro Abs: 2 10*3/uL (ref 1.7–7.7)
Neutrophils Relative %: 47 %
Platelets: 206 10*3/uL (ref 150–400)
RBC: 3.72 MIL/uL — ABNORMAL LOW (ref 4.22–5.81)
RDW: 12.6 % (ref 11.5–15.5)
WBC: 4.2 10*3/uL (ref 4.0–10.5)
nRBC: 0 % (ref 0.0–0.2)

## 2020-12-11 LAB — IRON AND TIBC
Iron: 119 ug/dL (ref 45–182)
Saturation Ratios: 41 % — ABNORMAL HIGH (ref 17.9–39.5)
TIBC: 292 ug/dL (ref 250–450)
UIBC: 173 ug/dL

## 2020-12-11 LAB — FERRITIN: Ferritin: 224 ng/mL (ref 24–336)

## 2020-12-11 LAB — PSA: Prostatic Specific Antigen: 2.22 ng/mL (ref 0.00–4.00)

## 2020-12-11 MED ORDER — DRONABINOL 2.5 MG PO CAPS: 2.5000 mg | ORAL_CAPSULE | Freq: Two times a day (BID) | ORAL | 1 refills | Status: DC

## 2020-12-11 NOTE — Patient Instructions (Signed)
[place] at [facility] Discharge Instructions  You were seen and examined today by Dr. Delton Coombes. He reviewed your most recent labs and everything looks good. The PSA is still pending and should be back later this evening or tomorrow morning. If your PSA is elevated Dr. Delton Coombes discussed you starting on Zytiga that you will take first thing in the morning one hour before you eat and prednisone 5 mg once daily. This will be sent to [facility] outpatient pharmacy.  Please follow up as scheduled.   Thank you for choosing [place] at [facility] to provide your oncology and hematology care.  To afford each patient quality time with our provider, please arrive at least 15 minutes before your scheduled appointment time.   If you have a lab appointment with the [facility] please come in thru the Main Entrance and check in at the main information desk.  You need to re-schedule your appointment should you arrive 10 or more minutes late.  We strive to give you quality time with our providers, and arriving late affects you and other patients whose appointments are after yours.  Also, if you no show three or more times for appointments you may be dismissed from the clinic at the providers discretion.     Again, thank you for choosing [facility].  Our hope is that these requests will decrease the amount of time that you wait before being seen by our physicians.       _____________________________________________________________  Should you have questions after your visit to [facility], please contact our office at [phone] and follow the prompts.  Our office hours are 8:00 a.m. and 4:30 p.m. Monday - Friday.  Please note that voicemails left after 4:00 p.m. may not be returned until the following business day.  We are closed weekends and major holidays.  You do have access to a nurse 24-7, just call the main number to the  clinic [phone] and do not press any options, hold on the line and a nurse will answer the phone.    For prescription refill requests, have your pharmacy contact our office and allow 72 hours.    Due to Covid, you will need to wear a mask upon entering the hospital. If you do not have a mask, a mask will be given to you at the Main Entrance upon arrival. For doctor visits, patients may have 1 support person age 49 or older with them. For treatment visits, patients can not have anyone with them due to social distancing guidelines and our immunocompromised population.

## 2020-12-13 ENCOUNTER — Telehealth (HOSPITAL_COMMUNITY): Payer: Self-pay | Admitting: *Deleted

## 2020-12-13 NOTE — Telephone Encounter (Signed)
Verified with pharmacy that Marinol was approved by insurance with a $100 copay.  Pharmacy to contact patient with that information.

## 2021-01-14 NOTE — Progress Notes (Signed)
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    CANCER STAGING:  Cancer Staging  Prostate cancer [facility]) Staging form: Prostate, AJCC 8th Edition - Clinical stage from 07/10/2020: Stage IVB (cTX, cNX, pM1c, PSA: 10.2) - Unsigned   INTERVAL HISTORY:  Mr. TADASHI BURKEL, a 87 y.o. 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 Lazlo was last seen on 12/11/2020.   Today he reports feeling poor, and he is accompanied by his daughter. He reports increased fatigue and sleepiness over the past week. He is drinking 1 bottle of Ensure daily with 3-4 small meals and snacks daily. His daughter reports he has a BM immediately after eating. His appetite is poor; he began taking Marinol on 10/28, and his appetite has not improved. He reports occasional dizziness. He reports he continues to have pain in his right lower back for which he is taking tramadol BID.   REVIEW OF SYSTEMS:  Review of Systems  Constitutional:  Positive for appetite change (25%) and fatigue.  Musculoskeletal:  Positive for back pain (R lower 8/10).  Neurological:  Positive for dizziness (occasional).  Psychiatric/Behavioral:  Positive for sleep disturbance.   All other systems reviewed and are negative.  PAST MEDICAL/SURGICAL HISTORY:  Past Medical History:  Diagnosis Date   Acid reflux    Anemia    Ex-cigarette smoker    Gout    Hyperlipemia    Hypertension    Paroxysmal atrial fibrillation ([place]) 2014   Prostate cancer ([place]) 6.23.2015   SEEDS 20 YEARS AGO    Prostate disease    Past Surgical History:  Procedure Laterality Date   LEFT HEART CATHETERIZATION WITH CORONARY ANGIOGRAM N/A 07/21/2012   Procedure: LEFT HEART CATHETERIZATION WITH CORONARY ANGIOGRAM;  Surgeon: Troy Sine, MD;  Location: [facility] C[facility] LAB;  Service: Cardiovascular;  Laterality: N/A;   PACEMAKER INSERTION     PERMANENT PACEMAKER INSERTION N/A 07/22/2012   Procedure: PERMANENT PACEMAKER INSERTION;  Surgeon: Sanda Klein, MD;  Location: [facility] CATH LAB;  Service: Cardiovascular;  Laterality: N/A;   PROSTATE SURGERY      SOCIAL HISTORY:  Social History   Socioeconomic History   Marital status: Married    Spouse name: Not on file   Number of children: Not on file   Years of education: Not on file   Highest education level: Not on file  Occupational History   Not on file  Tobacco Use   Smoking status: Former    Types: Cigarettes    Quit date: 07/20/2002    Years since quitting: 18.5   Smokeless tobacco: Never  Vaping Use   Vaping Use: Never used  Substance and Sexual Activity   Alcohol use: No   Drug use: No   Sexual activity: Not on file  Other Topics Concern   Not on file  Social History Narrative   Not on file   Social Determinants of Health   Financial Resource Strain: Low Risk    Difficulty of Paying Living Expenses: Not hard at all  Food Insecurity: No Food Insecurity  Worried About Charity fundraiser in the Last Year: Never true   [place] in the Last Year: Never true  Transportation Needs: No Transportation Needs   Lack of Transportation (Medical): No   Lack of Transportation (Non-Medical): No  Physical Activity: Insufficiently Active   Days of Exercise per Week: 3 days   Minutes of Exercise per Session: 30 min  Stress: No Stress Concern Present   Feeling of Stress : Not at all  Social Connections: Moderately Integrated   Frequency of Communication with Friends and Family: More than three times a week   Frequency of Social Gatherings  with Friends and Family: More than three times a week   Attends Religious Services: More than 4 times per year   Active Member of Clubs or Organizations: No   Attends Archivist Meetings: 1 to 4 times per year   Marital Status: Widowed  Human resources officer Violence: Not At Risk   Fear of Current or Ex-Partner: No   Emotionally Abused: No   Physically Abused: No   Sexually Abused: No    FAMILY HISTORY:  No family history on file.  CURRENT MEDICATIONS:  Current Outpatient Medications  Medication Sig Dispense Refill   amLODipine (NORVASC) 10 MG tablet Take 10 mg by mouth daily.     atorvastatin (LIPITOR) 20 MG tablet Take 1 tablet (20 mg total) by mouth daily at 6 PM. (Patient taking differently: Take 20 mg by mouth at bedtime.) 30 tablet 5   carvedilol (COREG) 25 MG tablet Take 50 mg by mouth 2 (two) times daily with a meal.     Cholecalciferol 25 MCG (1000 UT) tablet Take 1,000 Units by mouth daily.     colchicine 0.6 MG tablet Take 1 tablet (0.6 mg total) by mouth 2 (two) times daily as needed (gout). 30 tablet 0   diclofenac sodium (VOLTAREN) 1 % GEL Apply 2 g topically 4 (four) times daily. (Patient taking differently: Apply 2 g topically 4 (four) times daily as needed (pain).) 100 g 0   dronabinol (MARINOL) 2.5 MG capsule Take 1 capsule (2.5 mg total) by mouth 2 (two) times daily before a meal. 60 capsule 1   Ensure (ENSURE) Take 237 mLs by mouth daily as needed (nutrition).     ferrous sulfate 324 MG TBEC Take 324 mg by mouth 2 (two) times daily.     fluticasone (FLONASE) 50 MCG/ACT nasal spray Place 2 sprays into both nostrils daily. 16 g 2   HYDROcodone-acetaminophen (NORCO/VICODIN) 5-325 MG tablet Take 1 tablet by mouth every 6 (six) hours as needed for moderate pain. 20 tablet 0   omeprazole (PRILOSEC) 20 MG capsule Take 20 mg by mouth daily as needed (acid reflux).     potassium chloride SA (KLOR-CON) 20 MEQ tablet Take 20 mEq by mouth daily.     rivaroxaban (XARELTO)  20 MG TABS tablet Take 1 tablet (20 mg total) by mouth daily with supper. 20 tablet 0   silodosin (RAPAFLO) 8 MG CAPS capsule Take 1 capsule (8 mg total) by mouth at bedtime. 30 capsule 0   traMADol (ULTRAM) 50 MG tablet Take 1 tablet (50 mg total) by mouth 2 (two) times daily as needed for moderate pain or severe pain. 60 tablet 0   No current facility-administered medications for this visit.    ALLERGIES:  No Known Allergies  PHYSICAL EXAM:  Performance status (ECOG): 1 - Symptomatic but completely ambulatory  Vitals:   01/15/21 1139  BP: 130/71  Pulse: 77  Resp: 17  Temp: (!) 96.9 F (36.1 C)  SpO2: 97%   Wt Readings from Last 3 Encounters:  01/15/21 189 lb 8 oz (86 kg)  12/11/20 186 lb 4.8 oz (84.5 kg)  10/30/20 189 lb 6 oz (85.9 kg)   Physical Exam Vitals reviewed.  Constitutional:      Appearance: Normal appearance.  Cardiovascular:     Rate and Rhythm: Normal rate and regular rhythm.     Pulses: Normal pulses.     Heart sounds: Normal heart sounds.  Pulmonary:     Effort: Pulmonary effort is normal.     Breath sounds: Normal breath sounds.  Musculoskeletal:     Lumbar back: No tenderness.       Back:  Neurological:     General: No focal deficit present.     Mental Status: He is alert and oriented to person, place, and time.  Psychiatric:        Mood and Affect: Mood normal.        Behavior: Behavior normal.     LABORATORY DATA:  I have reviewed the labs as listed.  CBC Latest Ref Rng & Units 01/15/2021 12/11/2020 12/03/2020  WBC 4.0 - 10.5 K/uL 4.1 4.2 3.8(L)  Hemoglobin 13.0 - 17.0 g/dL 12.0(L) 12.7(L) 11.9(L)  Hematocrit 39.0 - 52.0 % 34.9(L) 37.6(L) 36.6(L)  Platelets 150 - 400 K/uL 197 206 214   CMP Latest Ref Rng & Units 01/15/2021 12/11/2020 12/03/2020  Glucose 70 - 99 mg/dL 91 88 97  BUN 8 - 23 mg/dL 18 26(H) 24(H)  Creatinine 0.61 - 1.24 mg/dL 1.21 1.23 1.38(H)  Sodium 135 - 145 mmol/L 136 134(L) 135  Potassium 3.5 - 5.1 mmol/L 4.1 4.4 4.5   Chloride 98 - 111 mmol/L 106 104 102  CO2 22 - 32 mmol/L 25 25 27   Calcium 8.9 - 10.3 mg/dL 9.3 9.4 9.3  Total Protein 6.5 - 8.1 g/dL 7.2 8.0 -  Total Bilirubin 0.3 - 1.2 mg/dL 0.9 1.1 -  Alkaline Phos 38 - 126 U/L 63 64 -  AST 15 - 41 U/L 20 27 -  ALT 0 - 44 U/L 16 27 -    DIAGNOSTIC IMAGING:  I have independently reviewed the scans and discussed with the patient. No results found.   ASSESSMENT:  1.  Metastatic prostate cancer to the right adrenal gland: -Presentation to the ER with worsening right-sided back pain, which has been on and off for the last couple of years. - Some decrease in appetite with 6 pound weight loss in the last 6 months.  No hematuria or other B symptoms. - CT renal study on 05/03/2020 showed new right adrenal mass with bilateral renal lesions.  Indeterminate L2 vertebral bodies sclerotic lesion.  Low-attenuation lesions in the kidneys measure up to 2.3 cm on the right and were similar to scan from July 2021. - CT CAP on 06/12/2020 did not show any evidence of nodal metastasis or metastatic disease in the chest.  Right adrenal mass measures 4.2 x 3.8 cm exerts mass-effect upon the posterior and right lateral wall of the IVC.  Right kidney masses measuring 2.3 x 2.3 cm and 1.9 x 1.7 cm suspicious for RCC.  Upper pole of the left kidney lesion 2.2 x 2.0 cm and medial cortex of the inferior pole measuring 2 x 2 cm and another interpolar left kidney lesion measuring 1.3 x 1.5 cm. - Bone scan on 06/13/2020 was negative. - Right adrenal biopsy consistent with prostatic  adenocarcinoma - Degarelix on 07/16/2020, transition to Eligard 45 mg on 08/21/2020.   2.  Social/family history: - He lives at home with his daughter.  He walks with a cane. - He mows lawn with a riding mower.  He has a garden and gross vegetables.  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  Quit smoking 25 years ago. - No family history of malignancies that he is aware of.   PLAN:  1.  Metastatic  prostate cancer to the right adrenal gland: - Eligard 45 mg on 08/21/2020. - Last PSA 2.22 (12/11/2020) from 1.28 (10/24/2020). - Recent CT scan showed improvement in the right renal lesion and stable kidney lesions. - Due to continuous worsening of PSA, I have recommended adding Abiraterone.  Because of his advanced age, I will start Abiraterone at 100 mg daily along with prednisone 5 mg daily. - We discussed common side effects including but not limited to hypertension, hypokalemia, fatigue, hot flashes.  We have sent prescription to his pharmacy. - I will reevaluate him in 3 weeks with repeat labs.    2.  Right sided back pain: - Right loin pain, predominantly at nighttime slightly worse.  He is requiring tramadol twice daily.  3.  Urinary urgency: - Continue Rapaflo.  4.  Decreased appetite: - He started taking Marinol 2.5 mg twice daily on 12/05/2020.  It did not help his appetite. - Discontinue Marinol. - We will start Megace 400 mg twice daily.   Orders placed this encounter:  No orders of the defined types were placed in this encounter.    Derek Jack, MD [place] [phone]   I, Thana Ates, am acting as a scribe for Dr. Derek Jack.  I, Derek Jack MD, have reviewed the above documentation for accuracy and completeness, and I agree with the above.

## 2021-01-15 ENCOUNTER — Telehealth (HOSPITAL_COMMUNITY): Payer: Self-pay | Admitting: Pharmacist

## 2021-01-15 ENCOUNTER — Other Ambulatory Visit: Payer: Self-pay

## 2021-01-15 ENCOUNTER — Other Ambulatory Visit (HOSPITAL_COMMUNITY): Payer: Self-pay

## 2021-01-15 ENCOUNTER — Telehealth (HOSPITAL_COMMUNITY): Payer: Self-pay | Admitting: Pharmacy Technician

## 2021-01-15 ENCOUNTER — Encounter (HOSPITAL_COMMUNITY): Payer: Self-pay | Admitting: Hematology

## 2021-01-15 ENCOUNTER — Inpatient Hospital Stay (HOSPITAL_COMMUNITY): Payer: Medicare Other | Attending: Hematology | Admitting: Hematology

## 2021-01-15 ENCOUNTER — Inpatient Hospital Stay (HOSPITAL_COMMUNITY): Payer: Medicare Other

## 2021-01-15 VITALS — BP 130/71 | HR 77 | Temp 96.9°F | Resp 17 | Wt 189.5 lb

## 2021-01-15 DIAGNOSIS — R3915 Urgency of urination: Secondary | ICD-10-CM | POA: Insufficient documentation

## 2021-01-15 DIAGNOSIS — C7971 Secondary malignant neoplasm of right adrenal gland: Secondary | ICD-10-CM | POA: Diagnosis not present

## 2021-01-15 DIAGNOSIS — C61 Malignant neoplasm of prostate: Secondary | ICD-10-CM

## 2021-01-15 DIAGNOSIS — D508 Other iron deficiency anemias: Secondary | ICD-10-CM | POA: Diagnosis not present

## 2021-01-15 DIAGNOSIS — C642 Malignant neoplasm of left kidney, except renal pelvis: Secondary | ICD-10-CM

## 2021-01-15 DIAGNOSIS — E278 Other specified disorders of adrenal gland: Secondary | ICD-10-CM

## 2021-01-15 DIAGNOSIS — C641 Malignant neoplasm of right kidney, except renal pelvis: Secondary | ICD-10-CM

## 2021-01-15 LAB — CBC WITH DIFFERENTIAL/PLATELET
Abs Immature Granulocytes: 0.01 10*3/uL (ref 0.00–0.07)
Basophils Absolute: 0 10*3/uL (ref 0.0–0.1)
Basophils Relative: 1 %
Eosinophils Absolute: 0.1 10*3/uL (ref 0.0–0.5)
Eosinophils Relative: 2 %
HCT: 34.9 % — ABNORMAL LOW (ref 39.0–52.0)
Hemoglobin: 12 g/dL — ABNORMAL LOW (ref 13.0–17.0)
Immature Granulocytes: 0 %
Lymphocytes Relative: 36 %
Lymphs Abs: 1.5 10*3/uL (ref 0.7–4.0)
MCH: 34 pg (ref 26.0–34.0)
MCHC: 34.4 g/dL (ref 30.0–36.0)
MCV: 98.9 fL (ref 80.0–100.0)
Monocytes Absolute: 0.5 10*3/uL (ref 0.1–1.0)
Monocytes Relative: 13 %
Neutro Abs: 2 10*3/uL (ref 1.7–7.7)
Neutrophils Relative %: 48 %
Platelets: 197 10*3/uL (ref 150–400)
RBC: 3.53 MIL/uL — ABNORMAL LOW (ref 4.22–5.81)
RDW: 12.7 % (ref 11.5–15.5)
WBC: 4.1 10*3/uL (ref 4.0–10.5)
nRBC: 0 % (ref 0.0–0.2)

## 2021-01-15 LAB — COMPREHENSIVE METABOLIC PANEL
ALT: 16 U/L (ref 0–44)
AST: 20 U/L (ref 15–41)
Albumin: 3.5 g/dL (ref 3.5–5.0)
Alkaline Phosphatase: 63 U/L (ref 38–126)
Anion gap: 5 (ref 5–15)
BUN: 18 mg/dL (ref 8–23)
CO2: 25 mmol/L (ref 22–32)
Calcium: 9.3 mg/dL (ref 8.9–10.3)
Chloride: 106 mmol/L (ref 98–111)
Creatinine, Ser: 1.21 mg/dL (ref 0.61–1.24)
GFR, Estimated: 56 mL/min — ABNORMAL LOW (ref >60)
Glucose, Bld: 91 mg/dL (ref 70–99)
Potassium: 4.1 mmol/L (ref 3.5–5.1)
Sodium: 136 mmol/L (ref 135–145)
Total Bilirubin: 0.9 mg/dL (ref 0.3–1.2)
Total Protein: 7.2 g/dL (ref 6.5–8.1)

## 2021-01-15 LAB — PSA: Prostatic Specific Antigen: 2.45 ng/mL (ref 0.00–4.00)

## 2021-01-15 LAB — MAGNESIUM: Magnesium: 1.7 mg/dL (ref 1.7–2.4)

## 2021-01-15 MED ORDER — PREDNISONE 5 MG PO TABS: 5.0000 mg | ORAL_TABLET | Freq: Every day | ORAL | 6 refills | Status: DC

## 2021-01-15 MED ORDER — MEGESTROL ACETATE 400 MG/10ML PO SUSP: 400.0000 mg | Freq: Two times a day (BID) | ORAL | 2 refills | Status: DC

## 2021-01-15 MED ORDER — ABIRATERONE ACETATE 250 MG PO TABS
500.0000 mg | ORAL_TABLET | Freq: Every day | ORAL | 1 refills | Status: DC
  Filled 2021-01-15: qty 120, 60d supply, fill #0

## 2021-01-15 NOTE — Telephone Encounter (Signed)
Oral Oncology Patient Advocate Encounter  Prior Authorization for Fabio Asa has been approved.    PA# [id number] Effective dates: 01/15/21 through 02/15/22  Patients co-pay is $100.00  Oral Oncology Clinic will continue to follow.   [place] Patient [place] Phone [phone] Fax [phone] 01/15/2021 2:30 PM

## 2021-01-15 NOTE — Patient Instructions (Addendum)
[place] at [facility] Discharge Instructions   You were seen and examined today by Dr. Delton Coombes.  Your PSA has gone up slightly.  Dr. Raliegh Ip would like to add a pill in addition to receiving the Eligard injection every 6 months.   We will start you on a pill called Zytiga - we will start the dose at half strength in order to help prevent tiredness.  It needs to be taken on an empty stomach and wait 1 hour prior to eating.  We will also have you take prednisone 5 mg daily.  You should take this pill with food.   Discontinue Marinol.  We sent a new prescription for appetite stimulant called Megace.  Take 2 teaspoons twice a day for appetite.   Return as scheduled for lab work and office visit.     Thank you for choosing [place] at [facility] to provide your oncology and hematology care.  To afford each patient quality time with our provider, please arrive at least 15 minutes before your scheduled appointment time.   If you have a lab appointment with the [facility] please come in thru the Main Entrance and check in at the main information desk.  You need to re-schedule your appointment should you arrive 10 or more minutes late.  We strive to give you quality time with our providers, and arriving late affects you and other patients whose appointments are after yours.  Also, if you no show three or more times for appointments you may be dismissed from the clinic at the providers discretion.     Again, thank you for choosing [facility].  Our hope is that these requests will decrease the amount of time that you wait before being seen by our physicians.       _____________________________________________________________  Should you have questions after your visit to [facility], please contact our office at [phone] and follow the prompts.  Our office hours are 8:00 a.m. and 4:30 p.m. Monday - Friday.  Please note  that voicemails left after 4:00 p.m. may not be returned until the following business day.  We are closed weekends and major holidays.  You do have access to a nurse 24-7, just call the main number to the clinic [phone] and do not press any options, hold on the line and a nurse will answer the phone.    For prescription refill requests, have your pharmacy contact our office and allow 72 hours.    Due to Covid, you will need to wear a mask upon entering the hospital. If you do not have a mask, a mask will be given to you at the Main Entrance upon arrival. For doctor visits, patients may have 1 support person age 23 or older with them. For treatment visits, patients can not have anyone with them due to social distancing guidelines and our immunocompromised population.

## 2021-01-15 NOTE — Telephone Encounter (Signed)
Oral Oncology Pharmacist Encounter  Received new prescription for Zytiga (abiraterone) for the treatment of metastatic prostate cancer, castration resistant in conjunction with prednisone and ADT, planned duration until disease progression or unacceptable drug toxicity.  CMP from 01/15/21 assessed, no relevant lab abnormalities. Prescription dose and frequency assessed.   Current medication list in Epic reviewed, a few DDIs with abiraterone identified: Carvedilol: Abiraterone may increase the serum concentration of carvedilol. Monitor patient for signs and symptoms of excessive response to carvedilol (eg, hypotension, bradycardia, orthostasis). Tramadol: Abiraterone may decrease serum concentrations of the active metabolites of tramadol. Monitor patient for decreased tramadol response.   Evaluated chart and no patient barriers to medication adherence identified.   Prescription has been e-scribed to the [facility] for benefits analysis and approval.  Oral Oncology Clinic will continue to follow for insurance authorization, copayment issues, initial counseling and start date.  Darl Pikes, PharmD, BCPS, BCOP, CPP Hematology/Oncology Clinical Pharmacist Practitioner [facility]/AP Oral [place] [phone]  01/15/2021 1:38 PM

## 2021-01-15 NOTE — Telephone Encounter (Signed)
Oral Oncology Patient Advocate Encounter   Received notification from [employer] that prior authorization for Derrick Long is required.   PA submitted on CoverMyMeds Key [id number] Status is pending   Oral Oncology Clinic will continue to follow.  [place] Patient [name] Phone [phone] Fax [phone] 01/15/2021 2:28 PM

## 2021-01-17 ENCOUNTER — Other Ambulatory Visit (HOSPITAL_COMMUNITY): Payer: Self-pay | Admitting: *Deleted

## 2021-01-17 DIAGNOSIS — C61 Malignant neoplasm of prostate: Secondary | ICD-10-CM

## 2021-01-20 ENCOUNTER — Other Ambulatory Visit: Payer: Self-pay

## 2021-01-20 ENCOUNTER — Other Ambulatory Visit (HOSPITAL_COMMUNITY): Payer: Self-pay | Admitting: Hematology

## 2021-01-20 ENCOUNTER — Ambulatory Visit (INDEPENDENT_AMBULATORY_CARE_PROVIDER_SITE_OTHER): Payer: Medicare Other | Admitting: Urology

## 2021-01-20 ENCOUNTER — Encounter: Payer: Self-pay | Admitting: Urology

## 2021-01-20 VITALS — BP 127/68 | HR 78 | Wt 189.0 lb

## 2021-01-20 DIAGNOSIS — R350 Frequency of micturition: Secondary | ICD-10-CM

## 2021-01-20 LAB — URINALYSIS, ROUTINE W REFLEX MICROSCOPIC
Bilirubin, UA: NEGATIVE
Glucose, UA: NEGATIVE
Ketones, UA: NEGATIVE
Leukocytes,UA: NEGATIVE
Nitrite, UA: NEGATIVE
Protein,UA: NEGATIVE
RBC, UA: NEGATIVE
Specific Gravity, UA: 1.02 (ref 1.005–1.030)
Urobilinogen, Ur: 0.2 mg/dL (ref 0.2–1.0)
pH, UA: 6 (ref 5.0–7.5)

## 2021-01-20 LAB — BLADDER SCAN AMB NON-IMAGING: Scan Result: 0

## 2021-01-20 NOTE — Progress Notes (Signed)
post void residual =0  Urological Symptom Review  Patient is experiencing the following symptoms: Get up at night to urinate Stream starts and stops Have to strain to urinate Injury to kidneys/bladder   Review of Systems  Gastrointestinal (upper)  : Negative for upper GI symptoms  Gastrointestinal (lower) : Negative for lower GI symptoms  Constitutional : Weight loss  Skin: Negative for skin symptoms  Eyes: Negative for eye symptoms  Ear/Nose/Throat : Negative for Ear/Nose/Throat symptoms  Hematologic/Lymphatic: Negative for Hematologic/Lymphatic symptoms  Cardiovascular : Negative for cardiovascular symptoms  Respiratory : Negative for respiratory symptoms  Endocrine: Negative for endocrine symptoms  Musculoskeletal: Negative for musculoskeletal symptoms  Neurological: Dizziness  Psychologic: Negative for psychiatric symptoms

## 2021-01-20 NOTE — Progress Notes (Signed)
01/20/2021 9:33 AM   Waynard Edwards 04/26/1926 [id number]  Referring provider: [facility], [facility] [address] [place],  [place] [address]  No chief complaint on file.   HPI: F/u -   1) frequency  - he has some urgency. PVR 148 ml, 10/22, UA clear. Noc x 3. Sometimes can't get to bathroom in time.  I started him on oxybutynin in addition to tamsulosin.  He ended up going to emergency with increased frequency and incontinence and post void had risen to the 400s.  Oxybutynin and tamsulosin was discontinued and he was started on silodosin 8 mg.  PVR 0 today.     2) Prostate cancer - under the care of Dr. Delton Coombes. Underwent brachytherapy in 2004. PSA rose to 4.5 and  then 10.5. CT 03/22 with L2 sclerotic lesion, right adrenal mass and bilateral renal mass (2.3 cm right and 2.2 cm left). Bone scan was negative. Adrenal mass bx c/w prostate adenocarcinoma.  ADT started May 2022. PSA down to 0.86 and 1.28. F/u CT 09/22 with decrease in size of adrenal mass. Stable renal mass. Nov 2022 PSA 2.45 and Dr. Raliegh Ip started abiraterone 100 mg and 5 mg prednisone.       3) renal mass - bilateral renal mass (2.3 cm right and 2.2 cm left) noted on eval for PCa on CT 05/22 and relatively stable on CT 09/22. Dr. Delton Coombes following.    Today, pt is seen for the above.    He retired from [employer]. He is on xarelto. Here with his daughter. He was in the [employer] and the [place] war.    PMH: Past Medical History:  Diagnosis Date   Acid reflux    Anemia    Ex-cigarette smoker    Gout    Hyperlipemia    Hypertension    Paroxysmal atrial fibrillation ([place]) 2014   Prostate cancer ([place]) 6.23.2015   SEEDS 20 YEARS AGO   Prostate disease     Surgical History: Past Surgical History:  Procedure Laterality Date   LEFT HEART CATHETERIZATION WITH CORONARY ANGIOGRAM N/A 07/21/2012   Procedure: LEFT HEART CATHETERIZATION WITH CORONARY ANGIOGRAM;  Surgeon: Troy Sine, MD;  Location: [facility] CATH LAB;  Service:  Cardiovascular;  Laterality: N/A;   PACEMAKER INSERTION     PERMANENT PACEMAKER INSERTION N/A 07/22/2012   Procedure: PERMANENT PACEMAKER INSERTION;  Surgeon: Sanda Klein, MD;  Location: [facility] CATH LAB;  Service: Cardiovascular;  Laterality: N/A;   PROSTATE SURGERY      Home Medications:  Allergies as of 01/20/2021   No Known Allergies      Medication List        Accurate as of January 20, 2021  9:33 AM. If you have any questions, ask your nurse or doctor.          abiraterone acetate 250 MG tablet Commonly known as: ZYTIGA Take 2 tablets (500 mg total) by mouth daily. Take on an empty stomach 1 hour before or 2 hours after a meal   amLODipine 10 MG tablet Commonly known as: NORVASC Take 10 mg by mouth daily.   atorvastatin 20 MG tablet Commonly known as: LIPITOR Take 1 tablet (20 mg total) by mouth daily at 6 PM. What changed: when to take this   carvedilol 25 MG tablet Commonly known as: COREG Take 50 mg by mouth 2 (two) times daily with a meal.   Cholecalciferol 25 MCG (1000 UT) tablet Take 1,000 Units by mouth daily.   colchicine 0.6 MG  tablet Take 1 tablet (0.6 mg total) by mouth 2 (two) times daily as needed (gout).   diclofenac sodium 1 % Gel Commonly known as: VOLTAREN Apply 2 g topically 4 (four) times daily. What changed:  when to take this reasons to take this   dronabinol 2.5 MG capsule Commonly known as: MARINOL Take 1 capsule (2.5 mg total) by mouth 2 (two) times daily before a meal.   Ensure Take 237 mLs by mouth daily as needed (nutrition).   ferrous sulfate 324 MG Tbec Take 324 mg by mouth 2 (two) times daily.   fluticasone 50 MCG/ACT nasal spray Commonly known as: FLONASE Place 2 sprays into both nostrils daily.   HYDROcodone-acetaminophen 5-325 MG tablet Commonly known as: NORCO/VICODIN Take 1 tablet by mouth every 6 (six) hours as needed for moderate pain.   megestrol 400 MG/10ML suspension Commonly known as: MEGACE Take 10 mLs  (400 mg total) by mouth 2 (two) times daily.   omeprazole 20 MG capsule Commonly known as: PRILOSEC Take 20 mg by mouth daily as needed (acid reflux).   potassium chloride SA 20 MEQ tablet Commonly known as: KLOR-CON M Take 20 mEq by mouth daily.   predniSONE 5 MG tablet Commonly known as: DELTASONE Take 1 tablet (5 mg total) by mouth daily with breakfast.   rivaroxaban 20 MG Tabs tablet Commonly known as: XARELTO Take 1 tablet (20 mg total) by mouth daily with supper.   silodosin 8 MG Caps capsule Commonly known as: Rapaflo Take 1 capsule (8 mg total) by mouth at bedtime.   traMADol 50 MG tablet Commonly known as: ULTRAM Take 1 tablet (50 mg total) by mouth 2 (two) times daily as needed for moderate pain or severe pain.        Allergies: No Known Allergies  Family History: No family history on file.  Social History:  reports that he quit smoking about 18 years ago. He has never used smokeless tobacco. He reports that he does not drink alcohol and does not use drugs.   Physical Exam: BP 127/68   Pulse 78   Wt 189 lb (85.7 kg)   BMI 24.94 kg/m   Constitutional:  Alert and oriented, No acute distress. HEENT: [place] AT, moist mucus membranes.  Trachea midline, no masses. Cardiovascular: No clubbing, cyanosis, or edema. Respiratory: Normal respiratory effort, no increased work of breathing. GI: Abdomen is soft, nontender, nondistended, no abdominal masses GU: No CVA tenderness Skin: No rashes, bruises or suspicious lesions. Neurologic: Grossly intact, no focal deficits, moving all 4 extremities. Psychiatric: Normal mood and affect.  Laboratory Data: Lab Results  Component Value Date   WBC 4.1 01/15/2021   HGB 12.0 (L) 01/15/2021   HCT 34.9 (L) 01/15/2021   MCV 98.9 01/15/2021   PLT 197 01/15/2021    Lab Results  Component Value Date   CREATININE 1.21 01/15/2021    Lab Results  Component Value Date   PSA Refused 12/22/2007   PSA 0.07 (L) 06/24/2007    PSA 0.10 05/25/2006    No results found for: TESTOSTERONE  Lab Results  Component Value Date   HGBA1C 5.6 07/19/2012    Urinalysis    Component Value Date/Time   COLORURINE YELLOW 12/03/2020 [place] 12/03/2020 1239   APPEARANCEUR Clear 11/18/2020 1321   LABSPEC 1.010 12/03/2020 1239   PHURINE 5.0 12/03/2020 [place] 12/03/2020 [place] 12/03/2020 1239   HGBUR negative 06/24/2007 [place] 12/03/2020  [place] Negative 11/18/2020 [place] 12/03/2020 [place] 12/03/2020 1239   UROBILINOGEN 1.0 07/29/2013 2032   NITRITE NEGATIVE 12/03/2020 [place] 12/03/2020 1239    Lab Results  Component Value Date   LABMICR Comment 11/18/2020   BACTERIA NONE SEEN 09/15/2019   Reviewed ED notes and Dr Raliegh Ip note.   Pertinent Imaging: CT Sep 2022  Results for orders placed in visit on 07/29/01  DG Abd 1 View  Narrative FINDINGS CLINICAL DATA:  PROSTATE CA. SINGLE VIEW ABDOMEN A NORMAL BOWEL GAS PATTERN IS DEMONSTRATED.  MILD TO MODERATE SPUR FORMATION IS DEMONSTRATED AT MULTIPLE LEVELS OF THE LUMBAR SPINE AS WELL AS MINIMAL LEVOCONVEX LUMBAR ROTARY SCOLIOSIS.  ALSO NOTED IS BRIDGING OSSIFICATION OR CALCIFICATION BETWEEN THE LOWER LUMBAR SPINE AND LEFT ILIAC WING. A TRANSITIONAL LUMBOSACRAL VERTEBRA IS ALSO DEMONSTRATED.  MILD ATHEROMATOUS ARTERIAL CALCIFICATIONS ARE NOTED. IMPRESSION NO ACUTE ABNORMALITY AND NO EVIDENCE OF BONY METASTATIC DISEASE.  No results found for this or any previous visit.  No results found for this or any previous visit.  No results found for this or any previous visit.  Results for orders placed during the hospital encounter of 08/01/13  US Renal  Narrative CLINICAL DATA:  Acute urinary retention  EXAM: RENAL/URINARY TRACT ULTRASOUND COMPLETE  COMPARISON:  04/14/2004  FINDINGS: Right Kidney:  Length: 10.2 cm.  Complex 2.0 x 1.8 x 1.6 cm echogenic lesion, right kidney lower pole. We were not able the demonstrate definite blood flow within this lesion. Hypoechoic but minimally complex 2.6 x 2.2 x 1.9 cm lesion, right kidney lower pole.  Left Kidney:  Length: 1.3 cm. Mild prominence of renal sinus adipose tissue. Tiny probable cyst, left mid-upper kidney, 4 mm. Equivocal small mid kidney nonobstructive calculus.  Bladder:  Nondistended.  Catheter in urinary bladder.  IMPRESSION: 1. Complex 2 cm echogenic lesion, right kidney lower pole, in vicinity of prior enhancing nodule. In 2006 this lesion measured 1.4 cm. This may represent slow growth of a small renal cell carcinoma. 2. Minimally complex 2.6 cm right kidney lower pole lesion, most likely a complex cyst although technically nonspecific. 3. Equivocal small left mid kidney nonobstructive calculus. Mild renal sinus adipose tissue prominence on the left.   Electronically Signed By: Sherryl Barters M.D. On: 08/01/2013 15:54  No results found for this or any previous visit.  No results found for this or any previous visit.  Results for orders placed during the hospital encounter of 05/03/20  CT Renal Stone Study  Narrative CLINICAL DATA:  Right-sided flank pain. Urinary frequency. History of prostate cancer.  EXAM: CT ABDOMEN AND PELVIS WITHOUT CONTRAST  TECHNIQUE: Multidetector CT imaging of the abdomen and pelvis was performed following the standard protocol without IV contrast.  COMPARISON:  09/15/2019 and 09/15/2019.  FINDINGS: Lower chest: Millimetric nodule in the posterior right lower lobe (5/57), unchanged. Mild volume loss in the lingula and both lower lobes. Heart is enlarged. Atherosclerotic calcification of the aorta, aortic valve and coronary arteries. No pericardial or pleural effusion. Distal esophagus is unremarkable.  Hepatobiliary: Visualized portions of the liver is unremarkable. Cholecystectomy.  Stable associated biliary ductal dilatation.  Pancreas: Negative.  Spleen: Negative.  Adrenals/Urinary Tract: New right adrenal mass measures 2.3 x 3.2 cm and 35 Hounsfield units. Left adrenal gland is unremarkable. Low-attenuation lesions in the kidneys measure up to 2.3 cm on the right and were better evaluated on 09/16/2019. Subcentimeter hyper dense lesions are too small to  characterize. No urinary stones. Ureters are decompressed. Bladder is grossly unremarkable.  Stomach/Bowel: Stomach and small bowel are unremarkable. Right hemicolectomy. Colon is otherwise unremarkable.  Vascular/Lymphatic: Atherosclerotic calcification of the aorta. Bilateral common iliac artery aneurysms, 2.2 cm in the right and 2.0 cm on the left. 9 mm right external iliac lymph node (3/67) has decreased in size from 1.4 cm on 09/15/2019. 9 mm lymph node in the sigmoid mesentery (3/69) is stable when remeasured. No additional pathologically enlarged lymph nodes.  Reproductive: Brachytherapy seeds in the prostate.  Other: No free fluid. Mesenteries and peritoneum are otherwise unremarkable.  Musculoskeletal: Small sclerotic lesions in the pubic bones, unchanged from 08/09/2013. A sclerotic lesion in the right L2 vertebral body is stable from the most recent prior examination but is new from 2015. Degenerative changes in the spine and hips, right greater than left. Levoconvex scoliosis.  IMPRESSION: 1. New right adrenal mass, highly worrisome for metastatic disease. 2. Bilateral renal lesions, characterized as enhancing and therefore malignant on 09/16/2019. 3. Indeterminate L2 vertebral body sclerotic lesion. 4. Interval decrease in size of a right external iliac lymph node. 9 mm lymph node in the sigmoid mesentery is stable. 5. Aortic atherosclerosis (ICD10-I70.0). Coronary artery calcification. Bilateral common iliac artery aneurysms.   Electronically Signed By: Lorin Picket M.D. On:  05/03/2020 10:19   Assessment & Plan:    1. Urine frequency PVR better and he will continue silodosin.  - Urinalysis, Routine w reflex microscopic - BLADDER SCAN AMB NON-IMAGING   No follow-ups on file.  Festus Aloe, MD  [facility]  [address] [place], [place] [address] [phone]

## 2021-01-21 ENCOUNTER — Telehealth: Payer: Self-pay

## 2021-01-21 ENCOUNTER — Other Ambulatory Visit: Payer: Self-pay

## 2021-01-21 DIAGNOSIS — R350 Frequency of micturition: Secondary | ICD-10-CM

## 2021-01-21 MED ORDER — SILODOSIN 8 MG PO CAPS: 8.0000 mg | ORAL_CAPSULE | Freq: Every day | ORAL | 0 refills | Status: DC

## 2021-01-21 NOTE — Telephone Encounter (Signed)
Oral Chemotherapy Pharmacist Encounter  Patient Education I spoke with patient's daughter Mamie for overview of new oral chemotherapy medication: Zytiga (abiraterone) for the treatment of metastatic prostate cancer, castration resistant in conjunction with prednisone and ADT, planned duration until disease progression or unacceptable drug toxicity.   Counseled Mamie on administration, dosing, side effects, monitoring, drug-food interactions, safe handling, storage, and disposal. Patient will take:  Abiraterone: Take 2 tablets (500 mg total) by mouth daily. Take on an empty stomach 1 hour before or 2 hours after a meal. Prednisone: Take 1 tablet (5 mg total) by mouth daily with breakfast.  Side effects include but not limited to: edema, fatigue, increased wbc, HTN.    Reviewed with Mamie importance of keeping a medication schedule and plan for any missed doses.  After discussion with Mamie no patient barriers to medication adherence identified.   Mamie voiced understanding and appreciation. All questions answered. Medication handout provided.  Provided Mamie with [facility] phone number. Mamie knows to call the office with questions or concerns. [facility] will continue to follow.  Darl Pikes, PharmD, BCPS, BCOP, CPP Hematology/Oncology Clinical Pharmacist Practitioner [place]/[facility]/AP [facility] [phone]  01/21/2021 11:08 AM

## 2021-01-21 NOTE — Telephone Encounter (Signed)
Refill submitted. 

## 2021-01-21 NOTE — Telephone Encounter (Signed)
Patients daughter called advising she went to pick up medication and it was not at the pharmacy.  Medication: silodosin (RAPAFLO) 8 MG CAPS capsule  Pharmacy: [facility]

## 2021-02-03 ENCOUNTER — Observation Stay (HOSPITAL_COMMUNITY)
Admission: EM | Admit: 2021-02-03 | Discharge: 2021-02-05 | Disposition: A | Discharge status: Home / Self Care | Payer: No Typology Code available for payment source | Attending: Internal Medicine | Admitting: Internal Medicine

## 2021-02-03 ENCOUNTER — Emergency Department (HOSPITAL_COMMUNITY): Payer: No Typology Code available for payment source

## 2021-02-03 ENCOUNTER — Emergency Department (HOSPITAL_COMMUNITY)
Admission: EM | Admit: 2021-02-03 | Discharge: 2021-02-03 | Disposition: A | Discharge status: Home / Self Care | Payer: No Typology Code available for payment source | Attending: Emergency Medicine | Admitting: Emergency Medicine

## 2021-02-03 ENCOUNTER — Encounter (HOSPITAL_COMMUNITY): Payer: Self-pay

## 2021-02-03 ENCOUNTER — Other Ambulatory Visit: Payer: Self-pay

## 2021-02-03 ENCOUNTER — Encounter (HOSPITAL_COMMUNITY): Payer: Self-pay | Admitting: Emergency Medicine

## 2021-02-03 DIAGNOSIS — R1011 Right upper quadrant pain: Secondary | ICD-10-CM | POA: Diagnosis present

## 2021-02-03 DIAGNOSIS — R001 Bradycardia, unspecified: Secondary | ICD-10-CM | POA: Diagnosis present

## 2021-02-03 DIAGNOSIS — R0689 Other abnormalities of breathing: Secondary | ICD-10-CM | POA: Diagnosis not present

## 2021-02-03 DIAGNOSIS — R109 Unspecified abdominal pain: Secondary | ICD-10-CM

## 2021-02-03 DIAGNOSIS — Z955 Presence of coronary angioplasty implant and graft: Secondary | ICD-10-CM | POA: Diagnosis not present

## 2021-02-03 DIAGNOSIS — Z79899 Other long term (current) drug therapy: Secondary | ICD-10-CM | POA: Insufficient documentation

## 2021-02-03 DIAGNOSIS — Z20822 Contact with and (suspected) exposure to covid-19: Secondary | ICD-10-CM | POA: Insufficient documentation

## 2021-02-03 DIAGNOSIS — M25511 Pain in right shoulder: Secondary | ICD-10-CM | POA: Diagnosis not present

## 2021-02-03 DIAGNOSIS — R7989 Other specified abnormal findings of blood chemistry: Secondary | ICD-10-CM

## 2021-02-03 DIAGNOSIS — Z87891 Personal history of nicotine dependence: Secondary | ICD-10-CM | POA: Diagnosis not present

## 2021-02-03 DIAGNOSIS — I10 Essential (primary) hypertension: Secondary | ICD-10-CM | POA: Diagnosis not present

## 2021-02-03 DIAGNOSIS — Z8546 Personal history of malignant neoplasm of prostate: Secondary | ICD-10-CM | POA: Insufficient documentation

## 2021-02-03 DIAGNOSIS — Z7901 Long term (current) use of anticoagulants: Secondary | ICD-10-CM | POA: Insufficient documentation

## 2021-02-03 DIAGNOSIS — Z950 Presence of cardiac pacemaker: Secondary | ICD-10-CM | POA: Diagnosis not present

## 2021-02-03 DIAGNOSIS — C61 Malignant neoplasm of prostate: Secondary | ICD-10-CM | POA: Diagnosis present

## 2021-02-03 DIAGNOSIS — K31819 Angiodysplasia of stomach and duodenum without bleeding: Secondary | ICD-10-CM | POA: Diagnosis not present

## 2021-02-03 DIAGNOSIS — Z85038 Personal history of other malignant neoplasm of large intestine: Secondary | ICD-10-CM | POA: Diagnosis not present

## 2021-02-03 DIAGNOSIS — R17 Unspecified jaundice: Secondary | ICD-10-CM

## 2021-02-03 DIAGNOSIS — I480 Paroxysmal atrial fibrillation: Secondary | ICD-10-CM | POA: Diagnosis not present

## 2021-02-03 DIAGNOSIS — R1031 Right lower quadrant pain: Secondary | ICD-10-CM

## 2021-02-03 HISTORY — DX: Unspecified abdominal pain: R10.9

## 2021-02-03 HISTORY — DX: Malignant neoplasm of colon, unspecified: C18.9

## 2021-02-03 LAB — CBC WITH DIFFERENTIAL/PLATELET
Abs Immature Granulocytes: 0.04 10*3/uL (ref 0.00–0.07)
Abs Immature Granulocytes: 0.04 10*3/uL (ref 0.00–0.07)
Basophils Absolute: 0 10*3/uL (ref 0.0–0.1)
Basophils Absolute: 0 10*3/uL (ref 0.0–0.1)
Basophils Relative: 0 %
Basophils Relative: 0 %
Eosinophils Absolute: 0 10*3/uL (ref 0.0–0.5)
Eosinophils Absolute: 0.1 10*3/uL (ref 0.0–0.5)
Eosinophils Relative: 1 %
Eosinophils Relative: 1 %
HCT: 34.7 % — ABNORMAL LOW (ref 39.0–52.0)
HCT: 35.9 % — ABNORMAL LOW (ref 39.0–52.0)
Hemoglobin: 12.1 g/dL — ABNORMAL LOW (ref 13.0–17.0)
Hemoglobin: 12.1 g/dL — ABNORMAL LOW (ref 13.0–17.0)
Immature Granulocytes: 1 %
Immature Granulocytes: 1 %
Lymphocytes Relative: 22 %
Lymphocytes Relative: 20 %
Lymphs Abs: 1.7 10*3/uL (ref 0.7–4.0)
Lymphs Abs: 1.7 10*3/uL (ref 0.7–4.0)
MCH: 34.6 pg — ABNORMAL HIGH (ref 26.0–34.0)
MCH: 33.1 pg (ref 26.0–34.0)
MCHC: 34.9 g/dL (ref 30.0–36.0)
MCHC: 33.7 g/dL (ref 30.0–36.0)
MCV: 99.1 fL (ref 80.0–100.0)
MCV: 98.1 fL (ref 80.0–100.0)
Monocytes Absolute: 0.9 10*3/uL (ref 0.1–1.0)
Monocytes Absolute: 0.9 10*3/uL (ref 0.1–1.0)
Monocytes Relative: 11 %
Monocytes Relative: 11 %
Neutro Abs: 5 10*3/uL (ref 1.7–7.7)
Neutro Abs: 5.7 10*3/uL (ref 1.7–7.7)
Neutrophils Relative %: 65 %
Neutrophils Relative %: 67 %
Platelets: 232 10*3/uL (ref 150–400)
Platelets: 223 10*3/uL (ref 150–400)
RBC: 3.5 MIL/uL — ABNORMAL LOW (ref 4.22–5.81)
RBC: 3.66 MIL/uL — ABNORMAL LOW (ref 4.22–5.81)
RDW: 13.3 % (ref 11.5–15.5)
RDW: 13.2 % (ref 11.5–15.5)
WBC: 7.7 10*3/uL (ref 4.0–10.5)
WBC: 8.5 10*3/uL (ref 4.0–10.5)
nRBC: 0 % (ref 0.0–0.2)
nRBC: 0 % (ref 0.0–0.2)

## 2021-02-03 LAB — COMPREHENSIVE METABOLIC PANEL
ALT: 20 U/L (ref 0–44)
ALT: 22 U/L (ref 0–44)
AST: 19 U/L (ref 15–41)
AST: 21 U/L (ref 15–41)
Albumin: 3.5 g/dL (ref 3.5–5.0)
Albumin: 3.8 g/dL (ref 3.5–5.0)
Alkaline Phosphatase: 57 U/L (ref 38–126)
Alkaline Phosphatase: 54 U/L (ref 38–126)
Anion gap: 9 (ref 5–15)
Anion gap: 7 (ref 5–15)
BUN: 31 mg/dL — ABNORMAL HIGH (ref 8–23)
BUN: 26 mg/dL — ABNORMAL HIGH (ref 8–23)
CO2: 19 mmol/L — ABNORMAL LOW (ref 22–32)
CO2: 21 mmol/L — ABNORMAL LOW (ref 22–32)
Calcium: 9.5 mg/dL (ref 8.9–10.3)
Calcium: 9.8 mg/dL (ref 8.9–10.3)
Chloride: 106 mmol/L (ref 98–111)
Chloride: 104 mmol/L (ref 98–111)
Creatinine, Ser: 1.31 mg/dL — ABNORMAL HIGH (ref 0.61–1.24)
Creatinine, Ser: 1.08 mg/dL (ref 0.61–1.24)
GFR, Estimated: 51 mL/min — ABNORMAL LOW (ref >60)
GFR, Estimated: >60 mL/min (ref >60)
Glucose, Bld: 122 mg/dL — ABNORMAL HIGH (ref 70–99)
Glucose, Bld: 115 mg/dL — ABNORMAL HIGH (ref 70–99)
Potassium: 4 mmol/L (ref 3.5–5.1)
Potassium: 4.2 mmol/L (ref 3.5–5.1)
Sodium: 134 mmol/L — ABNORMAL LOW (ref 135–145)
Sodium: 132 mmol/L — ABNORMAL LOW (ref 135–145)
Total Bilirubin: 0.6 mg/dL (ref 0.3–1.2)
Total Bilirubin: 0.8 mg/dL (ref 0.3–1.2)
Total Protein: 7.2 g/dL (ref 6.5–8.1)
Total Protein: 7.5 g/dL (ref 6.5–8.1)

## 2021-02-03 LAB — URINALYSIS, ROUTINE W REFLEX MICROSCOPIC
Bacteria, UA: NONE SEEN
Bilirubin Urine: NEGATIVE
Glucose, UA: NEGATIVE mg/dL
Ketones, ur: NEGATIVE mg/dL
Leukocytes,Ua: NEGATIVE
Nitrite: NEGATIVE
Protein, ur: 30 mg/dL — AB
Specific Gravity, Urine: 1.019 (ref 1.005–1.030)
pH: 6 (ref 5.0–8.0)

## 2021-02-03 LAB — RESP PANEL BY RT-PCR (FLU A&B, COVID) ARPGX2
Influenza A by PCR: NEGATIVE
Influenza B by PCR: NEGATIVE
SARS Coronavirus 2 by RT PCR: NEGATIVE

## 2021-02-03 LAB — RAPID URINE DRUG SCREEN, HOSP PERFORMED
Amphetamines: NOT DETECTED
Barbiturates: NOT DETECTED
Benzodiazepines: NOT DETECTED
Cocaine: NOT DETECTED
Opiates: POSITIVE — AB
Tetrahydrocannabinol: NOT DETECTED

## 2021-02-03 LAB — TROPONIN I (HIGH SENSITIVITY)
Troponin I (High Sensitivity): 55 ng/L — ABNORMAL HIGH (ref <18)
Troponin I (High Sensitivity): 50 ng/L — ABNORMAL HIGH (ref <18)
Troponin I (High Sensitivity): 40 ng/L — ABNORMAL HIGH (ref <18)
Troponin I (High Sensitivity): 46 ng/L — ABNORMAL HIGH (ref <18)

## 2021-02-03 LAB — LIPASE, BLOOD: Lipase: 35 U/L (ref 11–51)

## 2021-02-03 MED ORDER — PANTOPRAZOLE SODIUM 40 MG IV SOLR
40.0000 mg | INTRAVENOUS | Status: DC
  Administered 2021-02-03 – 2021-02-04 (×2): 40 mg via INTRAVENOUS
  Filled 2021-02-03 (×2): qty 40

## 2021-02-03 MED ORDER — HYDROMORPHONE HCL 1 MG/ML IJ SOLN
0.5000 mg | Freq: Once | INTRAMUSCULAR | Status: AC
  Administered 2021-02-03: 17:00:00 0.5 mg via INTRAMUSCULAR
  Filled 2021-02-03: qty 1

## 2021-02-03 MED ORDER — ONDANSETRON HCL 4 MG/2ML IJ SOLN: 4.0000 mg | Freq: Once | INTRAMUSCULAR | Status: DC

## 2021-02-03 MED ORDER — AMLODIPINE BESYLATE 5 MG PO TABS
10.0000 mg | ORAL_TABLET | Freq: Every day | ORAL | Status: DC
  Administered 2021-02-04 – 2021-02-05 (×2): 10 mg via ORAL
  Filled 2021-02-03 (×2): qty 2

## 2021-02-03 MED ORDER — ACETAMINOPHEN 650 MG RE SUPP: 650.0000 mg | Freq: Four times a day (QID) | RECTAL | Status: DC | PRN

## 2021-02-03 MED ORDER — ACETAMINOPHEN 325 MG PO TABS: 650.0000 mg | ORAL_TABLET | Freq: Four times a day (QID) | ORAL | Status: DC | PRN

## 2021-02-03 MED ORDER — ONDANSETRON 4 MG PO TBDP
4.0000 mg | ORAL_TABLET | Freq: Once | ORAL | Status: AC
  Administered 2021-02-03: 17:00:00 4 mg via ORAL
  Filled 2021-02-03: qty 1

## 2021-02-03 MED ORDER — MEGESTROL ACETATE 400 MG/10ML PO SUSP
400.0000 mg | Freq: Two times a day (BID) | ORAL | Status: DC
  Administered 2021-02-03 – 2021-02-04 (×3): 400 mg via ORAL
  Filled 2021-02-03 (×4): qty 10

## 2021-02-03 MED ORDER — CARVEDILOL 12.5 MG PO TABS
50.0000 mg | ORAL_TABLET | Freq: Two times a day (BID) | ORAL | Status: DC
  Administered 2021-02-04 – 2021-02-05 (×3): 50 mg via ORAL
  Filled 2021-02-03 (×3): qty 4

## 2021-02-03 MED ORDER — MORPHINE SULFATE (PF) 4 MG/ML IV SOLN
2.0000 mg | Freq: Once | INTRAVENOUS | Status: AC
  Administered 2021-02-03: 05:00:00 2 mg via INTRAVENOUS
  Filled 2021-02-03: qty 1

## 2021-02-03 MED ORDER — HYDROMORPHONE HCL 1 MG/ML IJ SOLN
0.5000 mg | INTRAMUSCULAR | Status: DC | PRN
  Filled 2021-02-03: qty 0.5

## 2021-02-03 MED ORDER — HYDROMORPHONE HCL 1 MG/ML IJ SOLN: 1.0000 mg | Freq: Once | INTRAMUSCULAR | Status: DC

## 2021-02-03 MED ORDER — SODIUM CHLORIDE 0.9 % IV SOLN: INTRAVENOUS | Status: AC

## 2021-02-03 MED ORDER — HYDROMORPHONE HCL 1 MG/ML IJ SOLN
0.5000 mg | INTRAMUSCULAR | Status: DC | PRN
  Administered 2021-02-03: 22:00:00 0.5 mg via INTRAVENOUS

## 2021-02-03 MED ORDER — PREDNISONE 5 MG PO TABS
5.0000 mg | ORAL_TABLET | Freq: Every day | ORAL | Status: DC
  Administered 2021-02-04 – 2021-02-05 (×2): 5 mg via ORAL
  Filled 2021-02-03 (×2): qty 1

## 2021-02-03 MED ORDER — IOHEXOL 350 MG/ML SOLN
100.0000 mL | Freq: Once | INTRAVENOUS | Status: AC | PRN
  Administered 2021-02-03: 06:00:00 100 mL via INTRAVENOUS

## 2021-02-03 MED ORDER — TAMSULOSIN HCL 0.4 MG PO CAPS
0.4000 mg | ORAL_CAPSULE | Freq: Every day | ORAL | Status: DC
  Administered 2021-02-04: 18:00:00 0.4 mg via ORAL
  Filled 2021-02-03: qty 1

## 2021-02-03 MED ORDER — FENTANYL CITRATE PF 50 MCG/ML IJ SOSY: 25.0000 ug | PREFILLED_SYRINGE | Freq: Once | INTRAMUSCULAR | Status: DC

## 2021-02-03 MED ORDER — ABIRATERONE ACETATE 250 MG PO TABS: 500.0000 mg | ORAL_TABLET | Freq: Every day | ORAL | Status: DC

## 2021-02-03 MED ORDER — POLYETHYLENE GLYCOL 3350 17 G PO PACK: 17.0000 g | PACK | Freq: Every day | ORAL | Status: DC | PRN

## 2021-02-03 MED ORDER — HYDRALAZINE HCL 25 MG PO TABS
25.0000 mg | ORAL_TABLET | Freq: Once | ORAL | Status: AC
  Administered 2021-02-03: 07:00:00 25 mg via ORAL
  Filled 2021-02-03: qty 1

## 2021-02-03 MED ORDER — PROMETHAZINE HCL 12.5 MG PO TABS: 12.5000 mg | ORAL_TABLET | Freq: Four times a day (QID) | ORAL | Status: DC | PRN

## 2021-02-03 MED ORDER — ENSURE ENLIVE PO LIQD: 237.0000 mL | Freq: Every day | ORAL | Status: DC | PRN

## 2021-02-03 NOTE — ED Provider Notes (Signed)
[facility] Provider Note   CSN: [id number] Arrival date & time: 02/03/21  0409     History Chief Complaint  Patient presents with   Flank Pain    [place]rier is a 87 y.o. male.  Patient presents with complaint of mid abdominal pain and right scapular pain.  Describes a sharp and severe.  They started in the middle the night without any exacerbating cause.  Denies any fall or trauma.  Nothing seems to make the pain better or worse.  No associated chest pain no fevers no cough no vomiting no diarrhea reported.      Past Medical History:  Diagnosis Date   Acid reflux    Anemia    Ex-cigarette smoker    Gout    Hyperlipemia    Hypertension    Paroxysmal atrial fibrillation ([place]) 2014   Prostate cancer ([place]) 6.23.2015   SEEDS 20 YEARS AGO   Prostate disease     Patient Active Problem List   Diagnosis Date Noted   Prostate cancer ([place]) 07/10/2020   Bilateral kidney masses 06/06/2020   Adrenal mass, right ([place]) 06/06/2020   Acute colitis 09/16/2019   Abdominal pain 09/16/2019   Nausea 09/16/2019   Hypoalbuminemia 09/16/2019   Pelvic lymphadenopathy 09/16/2019   History of atrial fibrillation 09/16/2019   Colitis 09/16/2019   Paroxysmal atrial fibrillation ([facility]) 09/02/2012   HTN (hypertension) 09/02/2012   Pacemaker 08/03/2012   Chest pain 07/31/2012   Leukocytosis, unspecified 07/31/2012   AKI (acute kidney injury) ([place]) 07/31/2012   Gout 07/31/2012   First degree AV block, PR interval 370 ms 07/19/2012   Chest pain, atypical 07/19/2012   Acute renal insufficiency 07/19/2012   Hypokalemia 07/19/2012   Syncope 07/19/2012   Bradycardia 07/19/2012   LUMBAR STRAIN 09/10/2008   ANEMIA, IRON DEFICIENCY NOS 08/16/2008   OSTEOARTHRITIS, MODERATE 09/21/2007   INSOMNIA 06/24/2007   Dyslipidemia 04/05/2007   DENTAL CARIES 10/08/2006   LATERAL MENISCUS TEAR, RIGHT 04/19/2006   MIGRAINE HEADACHE 12/18/2005   Essential hypertension  12/18/2005   GERD 12/18/2005   RENAL CALCULUS 12/18/2005   BENIGN PROSTATIC HYPERTROPHY, WITH OBSTRUCTION 12/18/2005   ARTHRITIS 12/18/2005   LOW BACK PAIN 12/18/2005   OSTEOPENIA 12/18/2005   COLON CANCER, HX OF 12/18/2005   PROSTATE CANCER, HX OF 12/18/2005    Past Surgical History:  Procedure Laterality Date   LEFT HEART CATHETERIZATION WITH CORONARY ANGIOGRAM N/A 07/21/2012   Procedure: LEFT HEART CATHETERIZATION WITH CORONARY ANGIOGRAM;  Surgeon: Troy Sine, MD;  Location: [facility] CATH LAB;  Service: Cardiovascular;  Laterality: N/A;   PACEMAKER INSERTION     PERMANENT PACEMAKER INSERTION N/A 07/22/2012   Procedure: PERMANENT PACEMAKER INSERTION;  Surgeon: Sanda Klein, MD;  Location: [facility] CATH LAB;  Service: Cardiovascular;  Laterality: N/A;   PROSTATE SURGERY         History reviewed. No pertinent family history.  Social History   Tobacco Use   Smoking status: Former    Types: Cigarettes    Quit date: 07/20/2002    Years since quitting: 18.5   Smokeless tobacco: Never  Vaping Use   Vaping Use: Never used  Substance Use Topics   Alcohol use: No   Drug use: No    Home Medications Prior to Admission medications   Medication Sig Start Date End Date Taking? Authorizing Provider  amLODipine (NORVASC) 10 MG tablet Take 10 mg by mouth daily.   Yes [provider]  atorvastatin (LIPITOR) 20 MG tablet Take 1 tablet (  20 mg total) by mouth daily at 6 PM. Patient taking differently: Take 20 mg by mouth at bedtime. 07/23/12  Yes Rosita Fire, Brittainy M, PA-C  carvedilol (COREG) 25 MG tablet Take 50 mg by mouth 2 (two) times daily with a meal.   Yes [provider]  Cholecalciferol 25 MCG (1000 UT) tablet Take 1,000 Units by mouth daily.   Yes [provider]  diclofenac sodium (VOLTAREN) 1 % GEL Apply 2 g topically 4 (four) times daily. Patient taking differently: Apply 2 g topically 4 (four) times daily as needed (pain). 06/19/17  Yes Rancour, Annie Main, MD   Ensure (ENSURE) Take 237 mLs by mouth daily as needed (nutrition).   Yes [provider]  ferrous sulfate 324 MG TBEC Take 324 mg by mouth 2 (two) times daily.   Yes [provider]  HYDROcodone-acetaminophen (NORCO/VICODIN) 5-325 MG tablet Take 1 tablet by mouth every 6 (six) hours as needed for moderate pain. 05/03/20  Yes Milton Ferguson, MD  potassium chloride SA (KLOR-CON) 20 MEQ tablet Take 20 mEq by mouth daily. 02/28/20  Yes [provider]  rivaroxaban (XARELTO) 20 MG TABS tablet Take 1 tablet (20 mg total) by mouth daily with supper. 10/30/13  Yes Croitoru, Mihai, MD  silodosin (RAPAFLO) 8 MG CAPS capsule Take 1 capsule (8 mg total) by mouth at bedtime. 01/21/21  Yes Festus Aloe, MD  traMADol (ULTRAM) 50 MG tablet Take 1 tablet (50 mg total) by mouth 2 (two) times daily as needed for moderate pain or severe pain. 10/30/20  Yes Derek Jack, MD  abiraterone acetate (ZYTIGA) 250 MG tablet Take 2 tablets (500 mg total) by mouth daily. Take on an empty stomach 1 hour before or 2 hours after a meal Patient not taking: Reported on 02/03/2021 01/15/21   Derek Jack, MD  colchicine 0.6 MG tablet Take 1 tablet (0.6 mg total) by mouth 2 (two) times daily as needed (gout). Patient not taking: Reported on 02/03/2021 02/18/17   Margarita Mail, PA-C  dronabinol (MARINOL) 2.5 MG capsule Take 1 capsule (2.5 mg total) by mouth 2 (two) times daily before a meal. Patient not taking: Reported on 02/03/2021 12/11/20   Derek Jack, MD  fluticasone [facility]) 50 MCG/ACT nasal spray Place 2 sprays into both nostrils daily. Patient not taking: Reported on 02/03/2021 02/15/16   Ward, Delice Bison, DO  megestrol (MEGACE) 400 MG/10ML suspension Take 10 mLs (400 mg total) by mouth 2 (two) times daily. Patient not taking: Reported on 02/03/2021 01/15/21   Derek Jack, MD  omeprazole (PRILOSEC) 20 MG capsule Take 20 mg by mouth daily as needed (acid  reflux). Patient not taking: Reported on 02/03/2021    [provider]  predniSONE (DELTASONE) 5 MG tablet Take 1 tablet (5 mg total) by mouth daily with breakfast. Patient not taking: Reported on 02/03/2021 01/15/21   Derek Jack, MD    Allergies    Patient has no known allergies.  Review of Systems   Review of Systems  Constitutional:  Negative for fever.  HENT:  Negative for ear pain and sore throat.   Eyes:  Negative for pain.  Respiratory:  Negative for cough.   Cardiovascular:  Negative for chest pain.  Gastrointestinal:  Positive for abdominal pain.  Genitourinary:  Positive for flank pain.  Musculoskeletal:  Negative for back pain.  Skin:  Negative for color change and rash.  Neurological:  Negative for syncope.  All other systems reviewed and are negative.  Physical Exam Updated Vital Signs BP Marland Kitchen)  178/67    Pulse (!) 59    Temp 98 F (36.7 C) (Oral)    Resp 17    Ht 6\' 1"  (1.854 m)    Wt 90.7 kg    SpO2 94%    BMI 26.39 kg/m   Physical Exam Constitutional:      Appearance: He is well-developed.  HENT:     Head: Normocephalic.     Nose: Nose normal.  Eyes:     Extraocular Movements: Extraocular movements intact.  Cardiovascular:     Rate and Rhythm: Normal rate.  Pulmonary:     Effort: Pulmonary effort is normal.  Abdominal:     Tenderness: There is no abdominal tenderness. There is no guarding or rebound.  Skin:    Coloration: Skin is not jaundiced.  Neurological:     Mental Status: He is alert. Mental status is at baseline.    ED Results / Procedures / Treatments   Labs (all labs ordered are listed, but only abnormal results are displayed) Labs Reviewed  URINALYSIS, ROUTINE W REFLEX MICROSCOPIC - Abnormal; Notable for the following components:      Result Value   Color, Urine STRAW (*)    Hgb urine dipstick SMALL (*)    Protein, ur 30 (*)    All other components within normal limits  CBC WITH DIFFERENTIAL/PLATELET - Abnormal;  Notable for the following components:   RBC 3.50 (*)    Hemoglobin 12.1 (*)    HCT 34.7 (*)    MCH 34.6 (*)    All other components within normal limits  COMPREHENSIVE METABOLIC PANEL - Abnormal; Notable for the following components:   Sodium 134 (*)    CO2 19 (*)    Glucose, Bld 122 (*)    BUN 31 (*)    Creatinine, Ser 1.31 (*)    GFR, Estimated 51 (*)    All other components within normal limits  TROPONIN I (HIGH SENSITIVITY) - Abnormal; Notable for the following components:   Troponin I (High Sensitivity) 55 (*)    All other components within normal limits  TROPONIN I (HIGH SENSITIVITY) - Abnormal; Notable for the following components:   Troponin I (High Sensitivity) 50 (*)    All other components within normal limits  LIPASE, BLOOD    EKG EKG Interpretation  Date/Time:  Monday February 03 2021 04:23:09 EST Ventricular Rate:  57 PR Interval:  221 QRS Duration: 189 QT Interval:  522 QTC Calculation: 509 R Axis:   -86 Text Interpretation: Sinus rhythm Prolonged PR interval Ventricular preexcitation(WPW) Confirmed by Thamas Jaegers ([id number]) on 02/03/2021 4:39:35 AM  Radiology CT Angio Chest/Abd/Pel for Dissection W and/or Wo Contrast  Result Date: 02/03/2021 CLINICAL DATA:  Right flank pain.  Acute aortic syndrome suspected. EXAM: CT ANGIOGRAPHY CHEST, ABDOMEN AND PELVIS TECHNIQUE: Non-contrast CT of the chest was initially obtained. Multidetector CT imaging through the chest, abdomen and pelvis was performed using the standard protocol during bolus administration of intravenous contrast. Multiplanar reconstructed images and MIPs were obtained and reviewed to evaluate the vascular anatomy. CONTRAST:  135mL OMNIPAQUE IOHEXOL 350 MG/ML SOLN COMPARISON:  Chest abdomen pelvis CT 06/12/2020. Abdomen/pelvis CT 10/25/2020. FINDINGS: CTA CHEST FINDINGS Cardiovascular: Pre contrast imaging shows no hyperdense crescent in the wall of the thoracic aorta to suggest acute intramural hematoma.  Ascending thoracic aorta measures 4.5 cm diameter stable since 06/12/2020. No mural thickening or irregularity in the thoracic aorta. There is no dissection of the thoracic aorta. Branch vessel anatomy from the aortic arch  opacifies normally. Left permanent pacemaker noted. Mediastinum/Nodes: No mediastinal lymphadenopathy. There is no hilar lymphadenopathy. The esophagus has normal imaging features. There is no axillary lymphadenopathy. Lungs/Pleura: No suspicious pulmonary nodule or mass. 4 mm peripheral left lower lobe pulmonary nodule identified previously is stable a on 88/7 today. No focal airspace consolidation. There is no evidence of pleural effusion. Musculoskeletal: No worrisome lytic or sclerotic osseous abnormality. Review of the MIP images confirms the above findings. CTA ABDOMEN AND PELVIS FINDINGS VASCULAR Aorta: Normal caliber aorta without aneurysm, dissection, vasculitis or significant stenosis. Celiac: Calcific ostial plaque. Patent without evidence of aneurysm, dissection, vasculitis or significant stenosis. SMA: Patent without evidence of aneurysm, dissection, vasculitis or significant stenosis. Renals: Both renal arteries are patent without evidence of aneurysm, dissection, vasculitis, fibromuscular dysplasia or significant stenosis. IMA: Patent without evidence of aneurysm, dissection, vasculitis or significant stenosis. Inflow: Patent without evidence of aneurysm, dissection, vasculitis or significant stenosis. Right common iliac artery measures 1.9 cm diameter. Veins: No obvious venous abnormality within the limitations of this arterial phase study. Review of the MIP images confirms the above findings. NON-VASCULAR Hepatobiliary: Scattered tiny foci of arterial phase hyperenhancement are identified in both hepatic lobes, stable since 06/12/2020, likely benign and potentially vascular malformation or flash filling hemangiomas. Gallbladder surgically absent. No intrahepatic or extrahepatic  biliary dilation. Pancreas: No focal mass lesion. No dilatation of the main duct. No intraparenchymal cyst. No peripancreatic edema. Spleen: No splenomegaly. No focal mass lesion. Adrenals/Urinary Tract: Stable 3.2 cm right adrenal mass. No left adrenal nodule or mass. Multiple bilateral renal lesions again noted. Previous CT of 09/16/2019 characterized enhancing lesions in both kidneys. 2.3 cm enhancing lesion lateral interpolar right kidney was 2.2 cm on that study. 2.2 cm lesion in the lower pole right kidney shows apparent enhancement, also stable since 09/16/2019. Right renal lesions are stable. No evidence for hydroureter. The urinary bladder appears normal for the degree of distention. Stomach/Bowel: Stomach is unremarkable. No gastric wall thickening. No evidence of outlet obstruction. Duodenum is normally positioned as is the ligament of Treitz. No small bowel wall thickening. No small bowel dilatation. Status post right hemicolectomy. No gross colonic mass. No colonic wall thickening. Lymphatic: No abdominal lymphadenopathy No pelvic sidewall lymphadenopathy. Reproductive: Brachytherapy seeds noted in the prostate gland. Other: No intraperitoneal free fluid. Musculoskeletal: No worrisome lytic or sclerotic osseous abnormality. Degenerative changes noted in both hips, right greater than left. Review of the MIP images confirms the above findings. IMPRESSION: 1. No evidence for acute intramural hematoma or dissection of the thoracic aorta. There is no dissection of the thoracoabdominal aorta. 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 2. No acute findings in the chest, abdomen or pelvis. Specifically, no findings to explain the patient's history of right-sided flank pain. 3. Multiple bilateral renal lesions again noted with enhancing lesions in both kidneys as characterized previously. No substantial interval change. 4.  Scattered foci of arterial phase hyperenhancement in the liver are unchanged. Continued attention on follow-up recommended. 5. Stable 3.2 cm right adrenal mass. 6. Status post right hemicolectomy. 7. Aortic Atherosclerosis (ICD10-I70.0). Electronically Signed   By: Misty Stanley M.D.   On: 02/03/2021 06:53    Procedures Procedures   Medications Ordered in ED Medications  morphine 4 MG/ML injection 2 mg (2 mg Intravenous Given 02/03/21 0441)  iohexol (OMNIPAQUE) 350 MG/ML injection 100 mL (100 mLs Intravenous Contrast Given 02/03/21 0620)  hydrALAZINE (APRESOLINE) tablet 25 mg (25 mg  Oral Given 02/03/21 7408)    ED Course  I have reviewed the triage vital signs and the nursing notes.  Pertinent labs & imaging results that were available during my care of the patient were reviewed by me and considered in my medical decision making (see chart for details).    MDM Rules/Calculators/A&P                         Labs show normal white count normal hemoglobin.  Chemistry otherwise unremarkable.  Troponin mildly elevated.  Given unprovoked nature of his pain and location aortic etiology considered.  CT angio pursued.  Aortic aneurysm on CT angio was unchanged per radiology.  No additional findings for the cause of patient's pain noted.  2 sets of troponin continue be negative.  Urinalysis negative.  Patient appears improved with medications provided.  Will recommend continued outpatient follow-up with his doctor within the week.  Recommending immediate return for worsening symptoms or any additional concerns.       Final Clinical Impression(s) / ED Diagnoses Final diagnoses:  Flank pain    Rx / DC Orders ED Discharge Orders     None        Luna Fuse, MD 02/03/21 [phone]

## 2021-02-03 NOTE — ED Notes (Signed)
Two nurses have attempted IV access without success.

## 2021-02-03 NOTE — Discharge Instructions (Signed)
Call your primary care doctor or specialist as discussed in the next 2-3 days.   Return immediately back to the ER if:  Your symptoms worsen within the next 12-24 hours. You develop new symptoms such as new fevers, persistent vomiting, new pain, shortness of breath, or new weakness or numbness, or if you have any other concerns.  

## 2021-02-03 NOTE — ED Triage Notes (Signed)
Pt arrived via EMS with complaints of right flank pain that radiates towards right shoulder. Pt states that it has gotten worse since being discharged.

## 2021-02-03 NOTE — ED Triage Notes (Signed)
Pt with c/o R flank pain that started around 2130 last night. Denies any new urinary symptoms.

## 2021-02-03 NOTE — ED Notes (Signed)
RNs having difficult time obtaining peripheral IV. [place], [place] notified. Order given to give pain medication IM for now, but to continue to attempt peripheral IV after IM injection.

## 2021-02-03 NOTE — ED Notes (Addendum)
Pt becoming nauseous. Given emesis bag.

## 2021-02-03 NOTE — H&P (Addendum)
History and Physical    Derrick Long EVO:[id number] DOB: [birth year] DOA: 02/03/2021  PCP: [facility], [facility]   Patient coming from: Home  I have personally briefly reviewed patient's old medical records in [place] and [place]  Chief Complaint: Abdominal pain  HPI: Derrick Long is a 87 y.o. male with medical history significant for hypertension, atrial fibrillation, prostate cancer. Initially presented to the ED early hours of this morning with complaints of right flank pain and right upper abdominal pain.  Reports abdominal pain started about 9 PM last night.  Blood work was unremarkable, CT abdomen chest and pelvis was negative for any acute findings to explain his pain.  With improvement in his pain he was discharged home. Patient came back to the ED this afternoon reports of worsening pain and now with vomiting.  He had 4 episodes of vomiting in the ED.  Pain is right upper abdomen and right flank.  He denies NSAID use.  Denies alcohol intake.  No blood in vomitus.  No black stools or blood in stools.  Pain is constant.  Abdominal pain is not related to presence or absence of food.  Denies history of GI bleed or peptic ulcer.  Does not think he has had an EGD done before.  Last bowel movement was this morning at about 10:00. No pain with urination.   No chest pain.  Daughter Derrick Long is at bedside, but patient lives with his daughter Derrick Long.  Patient is awake alert oriented, independent of all ADLs and still drives.  ED Course: Temperature 97.4.  Heart rate mostly 50s.  Respiratory rate 16-24.  Blood pressure systolic 299B to 716R.  WBC 8.5.  Stable hemoglobin 12.1.  Sodium 132 otherwise unremarkable BMP.  COVID and influenza test negative.  Unremarkable liver enzymes.  Normal lipase 35.  Troponins 40 > 46.  Hospitalist to admit for intractable pain.  Review of Systems: As per HPI all other systems reviewed and negative.  Past Medical History:  Diagnosis Date   Acid reflux     Anemia    Ex-cigarette smoker    Gout    Hyperlipemia    Hypertension    Paroxysmal atrial fibrillation ([place]) 2014   Prostate cancer ([place]) 6.23.2015   SEEDS 20 YEARS AGO   Prostate disease     Past Surgical History:  Procedure Laterality Date   LEFT HEART CATHETERIZATION WITH CORONARY ANGIOGRAM N/A 07/21/2012   Procedure: LEFT HEART CATHETERIZATION WITH CORONARY ANGIOGRAM;  Surgeon: Troy Sine, MD;  Location: [facility] CATH LAB;  Service: Cardiovascular;  Laterality: N/A;   PACEMAKER INSERTION     PERMANENT PACEMAKER INSERTION N/A 07/22/2012   Procedure: PERMANENT PACEMAKER INSERTION;  Surgeon: Sanda Klein, MD;  Location: [facility] CATH LAB;  Service: Cardiovascular;  Laterality: N/A;   PROSTATE SURGERY       reports that he quit smoking about 18 years ago. He has never used smokeless tobacco. He reports that he does not drink alcohol and does not use drugs.  No Known Allergies  Family history of hypertension.  Prior to Admission medications   Medication Sig Start Date End Date Taking? Authorizing Provider  abiraterone acetate (ZYTIGA) 250 MG tablet Take 2 tablets (500 mg total) by mouth daily. Take on an empty stomach 1 hour before or 2 hours after a meal 01/15/21  Yes Derek Jack, MD  amLODipine (NORVASC) 10 MG tablet Take 10 mg by mouth daily.   Yes [provider]  atorvastatin (LIPITOR) 20 MG tablet Take  1 tablet (20 mg total) by mouth daily at 6 PM. Patient taking differently: Take 20 mg by mouth at bedtime. 07/23/12  Yes Rosita Fire, Brittainy M, PA-C  carvedilol (COREG) 25 MG tablet Take 50 mg by mouth 2 (two) times daily with a meal.   Yes [provider]  Cholecalciferol 25 MCG (1000 UT) tablet Take 1,000 Units by mouth daily.   Yes [provider]  diclofenac sodium (VOLTAREN) 1 % GEL Apply 2 g topically 4 (four) times daily. Patient taking differently: Apply 2 g topically 4 (four) times daily as needed (pain). 06/19/17  Yes Rancour, Annie Main, MD   Ensure (ENSURE) Take 237 mLs by mouth daily as needed (nutrition).   Yes [provider]  ferrous sulfate 324 MG TBEC Take 324 mg by mouth 2 (two) times daily.   Yes [provider]  HYDROcodone-acetaminophen (NORCO/VICODIN) 5-325 MG tablet Take 1 tablet by mouth every 6 (six) hours as needed for moderate pain. 05/03/20  Yes Milton Ferguson, MD  megestrol (MEGACE) 400 MG/10ML suspension Take 10 mLs (400 mg total) by mouth 2 (two) times daily. 01/15/21  Yes Derek Jack, MD  omeprazole (PRILOSEC) 20 MG capsule Take 20 mg by mouth daily as needed (acid reflux).   Yes [provider]  potassium chloride SA (KLOR-CON) 20 MEQ tablet Take 20 mEq by mouth daily. 02/28/20  Yes [provider]  predniSONE (DELTASONE) 5 MG tablet Take 1 tablet (5 mg total) by mouth daily with breakfast. 01/15/21  Yes Derek Jack, MD  rivaroxaban (XARELTO) 20 MG TABS tablet Take 1 tablet (20 mg total) by mouth daily with supper. 10/30/13  Yes Croitoru, Mihai, MD  silodosin (RAPAFLO) 8 MG CAPS capsule Take 1 capsule (8 mg total) by mouth at bedtime. 01/21/21  Yes Festus Aloe, MD  traMADol (ULTRAM) 50 MG tablet Take 1 tablet (50 mg total) by mouth 2 (two) times daily as needed for moderate pain or severe pain. 10/30/20  Yes Derek Jack, MD  colchicine 0.6 MG tablet Take 1 tablet (0.6 mg total) by mouth 2 (two) times daily as needed (gout). Patient not taking: Reported on 02/03/2021 02/18/17   Margarita Mail, PA-C  dronabinol (MARINOL) 2.5 MG capsule Take 1 capsule (2.5 mg total) by mouth 2 (two) times daily before a meal. Patient not taking: Reported on 02/03/2021 12/11/20   Derek Jack, MD  fluticasone [facility]) 50 MCG/ACT nasal spray Place 2 sprays into both nostrils daily. Patient not taking: Reported on 02/03/2021 02/15/16   Ward, Delice Bison, DO    Physical Exam: Vitals:   02/03/21 1600 02/03/21 1650 02/03/21 1730 02/03/21 1800  BP: (!) 194/69 (!)  174/67 (!) 179/56 (!) 174/71  Pulse: (!) 54 (!) 55 (!) 55 64  Resp: 16 19 (!) 21 20  Temp:      TempSrc:      SpO2: 100% 100% 99% 98%  Weight:      Height:        Constitutional: NAD, calm, comfortable Vitals:   02/03/21 1600 02/03/21 1650 02/03/21 1730 02/03/21 1800  BP: (!) 194/69 (!) 174/67 (!) 179/56 (!) 174/71  Pulse: (!) 54 (!) 55 (!) 55 64  Resp: 16 19 (!) 21 20  Temp:      TempSrc:      SpO2: 100% 100% 99% 98%  Weight:      Height:       Eyes: PERRL, lids and conjunctivae normal ENMT: Mucous membranes are moist. Posterior pharynx clear of any exudate or lesions.Normal  dentition.  Neck: normal, supple, no masses, no thyromegaly Respiratory: clear to auscultation bilaterally, no wheezing, no crackles. Normal respiratory effort. No accessory muscle use.  Cardiovascular: Regular rate and rhythm, 3/6 systolic murmur.  Lower extremities warm and well-perfused..  Pacemaker status  Abdomen: no tenderness elicited on palpation, no masses palpated. No hepatosplenomegaly. Bowel sounds positive.  Reports pain to right flank, not elicited on palpation. Musculoskeletal: no clubbing / cyanosis. No joint deformity upper and lower extremities. Good ROM, no contractures. Normal muscle tone.  Skin: no rashes, lesions, ulcers. No induration Neurologic: No apparent cranial nerve abnormality, 4+/5 strength in all extremities. Psychiatric: Normal judgment and insight. Alert and oriented x 3. Normal mood.   Labs on Admission: I have personally reviewed following labs and imaging studies  CBC: Recent Labs  Lab 02/03/21 0437 02/03/21 1510  WBC 7.7 8.5  NEUTROABS 5.0 5.7  HGB 12.1* 12.1*  HCT 34.7* 35.9*  MCV 99.1 98.1  PLT 232 401   Basic Metabolic Panel: Recent Labs  Lab 02/03/21 0437 02/03/21 1510  NA 134* 132*  K 4.0 4.2  CL 106 104  CO2 19* 21*  GLUCOSE 122* 115*  BUN 31* 26*  CREATININE 1.31* 1.08  CALCIUM 9.5 9.8   GFR: Estimated Creatinine Clearance: 49.3 mL/min (by  C-G formula based on SCr of 1.08 mg/dL). Liver Function Tests: Recent Labs  Lab 02/03/21 0437 02/03/21 1510  AST 19 21  ALT 20 22  ALKPHOS 57 54  BILITOT 0.6 0.8  PROT 7.2 7.5  ALBUMIN 3.5 3.8   Recent Labs  Lab 02/03/21 0437  LIPASE 35   Urine analysis:    Component Value Date/Time   COLORURINE STRAW (A) 02/03/2021 0715   APPEARANCEUR CLEAR 02/03/2021 0715   APPEARANCEUR Clear 01/20/2021 0938   LABSPEC 1.019 02/03/2021 0715   PHURINE 6.0 02/03/2021 0715   GLUCOSEU NEGATIVE 02/03/2021 0715   HGBUR SMALL (A) 02/03/2021 0715   HGBUR negative 06/24/2007 0833   BILIRUBINUR NEGATIVE 02/03/2021 0715   BILIRUBINUR Negative 01/20/2021 0938   KETONESUR NEGATIVE 02/03/2021 0715   PROTEINUR 30 (A) 02/03/2021 0715   UROBILINOGEN 1.0 07/29/2013 2032   NITRITE NEGATIVE 02/03/2021 0715   LEUKOCYTESUR NEGATIVE 02/03/2021 0715    Radiological Exams on Admission: CT Angio Chest/Abd/Pel for Dissection W and/or Wo Contrast  Result Date: 02/03/2021 CLINICAL DATA:  Right flank pain.  Acute aortic syndrome suspected. EXAM: CT ANGIOGRAPHY CHEST, ABDOMEN AND PELVIS TECHNIQUE: Non-contrast CT of the chest was initially obtained. Multidetector CT imaging through the chest, abdomen and pelvis was performed using the standard protocol during bolus administration of intravenous contrast. Multiplanar reconstructed images and MIPs were obtained and reviewed to evaluate the vascular anatomy. CONTRAST:  158mL OMNIPAQUE IOHEXOL 350 MG/ML SOLN COMPARISON:  Chest abdomen pelvis CT 06/12/2020. Abdomen/pelvis CT 10/25/2020. FINDINGS: CTA CHEST FINDINGS Cardiovascular: Pre contrast imaging shows no hyperdense crescent in the wall of the thoracic aorta to suggest acute intramural hematoma. Ascending thoracic aorta measures 4.5 cm diameter stable since 06/12/2020. No mural thickening or irregularity in the thoracic aorta. There is no dissection of the thoracic aorta. Branch vessel anatomy from the aortic arch  opacifies normally. Left permanent pacemaker noted. Mediastinum/Nodes: No mediastinal lymphadenopathy. There is no hilar lymphadenopathy. The esophagus has normal imaging features. There is no axillary lymphadenopathy. Lungs/Pleura: No suspicious pulmonary nodule or mass. 4 mm peripheral left lower lobe pulmonary nodule identified previously is stable a on 88/7 today. No focal airspace consolidation. There is no evidence of pleural effusion. Musculoskeletal: No worrisome  lytic or sclerotic osseous abnormality. Review of the MIP images confirms the above findings. CTA ABDOMEN AND PELVIS FINDINGS VASCULAR Aorta: Normal caliber aorta without aneurysm, dissection, vasculitis or significant stenosis. Celiac: Calcific ostial plaque. Patent without evidence of aneurysm, dissection, vasculitis or significant stenosis. SMA: Patent without evidence of aneurysm, dissection, vasculitis or significant stenosis. Renals: Both renal arteries are patent without evidence of aneurysm, dissection, vasculitis, fibromuscular dysplasia or significant stenosis. IMA: Patent without evidence of aneurysm, dissection, vasculitis or significant stenosis. Inflow: Patent without evidence of aneurysm, dissection, vasculitis or significant stenosis. Right common iliac artery measures 1.9 cm diameter. Veins: No obvious venous abnormality within the limitations of this arterial phase study. Review of the MIP images confirms the above findings. NON-VASCULAR Hepatobiliary: Scattered tiny foci of arterial phase hyperenhancement are identified in both hepatic lobes, stable since 06/12/2020, likely benign and potentially vascular malformation or flash filling hemangiomas. Gallbladder surgically absent. No intrahepatic or extrahepatic biliary dilation. Pancreas: No focal mass lesion. No dilatation of the main duct. No intraparenchymal cyst. No peripancreatic edema. Spleen: No splenomegaly. No focal mass lesion. Adrenals/Urinary Tract: Stable 3.2 cm right  adrenal mass. No left adrenal nodule or mass. Multiple bilateral renal lesions again noted. Previous CT of 09/16/2019 characterized enhancing lesions in both kidneys. 2.3 cm enhancing lesion lateral interpolar right kidney was 2.2 cm on that study. 2.2 cm lesion in the lower pole right kidney shows apparent enhancement, also stable since 09/16/2019. Right renal lesions are stable. No evidence for hydroureter. The urinary bladder appears normal for the degree of distention. Stomach/Bowel: Stomach is unremarkable. No gastric wall thickening. No evidence of outlet obstruction. Duodenum is normally positioned as is the ligament of Treitz. No small bowel wall thickening. No small bowel dilatation. Status post right hemicolectomy. No gross colonic mass. No colonic wall thickening. Lymphatic: No abdominal lymphadenopathy No pelvic sidewall lymphadenopathy. Reproductive: Brachytherapy seeds noted in the prostate gland. Other: No intraperitoneal free fluid. Musculoskeletal: No worrisome lytic or sclerotic osseous abnormality. Degenerative changes noted in both hips, right greater than left. Review of the MIP images confirms the above findings. IMPRESSION: 1. No evidence for acute intramural hematoma or dissection of the thoracic aorta. There is no dissection of the thoracoabdominal aorta. 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 2. No acute findings in the chest, abdomen or pelvis. Specifically, no findings to explain the patient's history of right-sided flank pain. 3. Multiple bilateral renal lesions again noted with enhancing lesions in both kidneys as characterized previously. No substantial interval change. 4. Scattered foci of arterial phase hyperenhancement in the liver are unchanged. Continued attention on follow-up recommended. 5. Stable 3.2 cm right adrenal mass. 6. Status post right hemicolectomy. 7. Aortic Atherosclerosis  (ICD10-I70.0). Electronically Signed   By: Misty Stanley M.D.   On: 02/03/2021 06:53    EKG: Independently reviewed.  Ventricular paced rhythm.  Rate 55.  QTc 511.  Assessment/Plan Principal Problem:   Intractable abdominal pain Active Problems:   Essential hypertension   Bradycardia   Pacemaker   Paroxysmal atrial fibrillation (HCC)   History of atrial fibrillation   Prostate cancer ([facility]CC)   Intractable abdominal pain, vomiting -epigastric and right flank.  CT abdomen and pelvis without acute abnormality to explain patient's pain.  No significant NSAID use.  No diarrhea.  Unremarkable lipase and liver enzymes.  Troponins flat - 55 > 50 > 40 . 46. Afebrile without leukocytosis.  Hemoglobin stable. -Bowel rest with clear  liquid diet. -N.p.o. midnight -GI evaluation - N/s 75cc/hr x 15hrs -IV Dilaudid 0.5 as needed -Hold Xarelto pending GI evaluation - UDS -IV Protonix 40 daily  Renal lesions-CT shows Multiple bilateral renal lesions again noted with enhancing lesions in both kidneys as characterized previously. No substantial interval change. -Follow-up as outpatient  Hypertension-elevated. -Resume carvedilol, Norvasc,  Paroxysmal atrial fibrillation, bradycardia, pacemaker status -Hold Xarelto pending GI evaluation,   Metastatic prostate cancer to right adrenal gland-follows with Dr. Delton Coombes. -Resume Megace -Resume abiraterone and continue daily 5 mg prednisone  Cardiac murmur- Echo 2014 also done for murmur did not reveal significant valve disease.  DVT prophylaxis: SCDs for now Code Status: Full code.  Confirmed with patient and daughter Derrick Long at bedside. Family Communication: Daughter Derrick Long at bedside, but patient lives with daughter Melik Blancett is [facility] POA. Disposition Plan:  ~ 2 days, pending resolution of abdominal pain, GI eval. Consults called: GI Admission status:  Obs Med surg   Bethena Roys MD [facility]  02/03/2021, 9:04 PM

## 2021-02-03 NOTE — ED Provider Notes (Signed)
[facility] Provider Note   CSN: [id number] Arrival date & time: 02/03/21  1229     History Chief Complaint  Patient presents with   Back Pain    Derrick Long is a 87 y.o. male.   Back Pain Associated symptoms: abdominal pain   Associated symptoms: no chest pain, no dysuria, no fever, no headaches, no numbness and no weakness        Derrick Long is a 87 y.o. male with past medical history of hypertension, hyperlipidemia, paroxysmal atrial fibs, anemia and prostate cancer who was evaluated earlier today in the emergency department for flank and abdominal pain.    He returns this afternoon now with nausea, vomiting and worsening pain of his abdomen.  Pain initially began last night.  At time of discharge this morning his pain had somewhat improved, but  now pain of the right abdomen is worse.  Family member who is at bedside endorses several episodes of vomiting.  He denies any fever, chills, diarrhea, and dysuria.  Past Medical History:  Diagnosis Date   Acid reflux    Anemia    Ex-cigarette smoker    Gout    Hyperlipemia    Hypertension    Paroxysmal atrial fibrillation ([place]) 2014   Prostate cancer ([place]) 6.23.2015   SEEDS 20 YEARS AGO   Prostate disease     Patient Active Problem List   Diagnosis Date Noted   Prostate cancer ([place]) 07/10/2020   Bilateral kidney masses 06/06/2020   Adrenal mass, right ([place]) 06/06/2020   Acute colitis 09/16/2019   Abdominal pain 09/16/2019   Nausea 09/16/2019   Hypoalbuminemia 09/16/2019   Pelvic lymphadenopathy 09/16/2019   History of atrial fibrillation 09/16/2019   Colitis 09/16/2019   Paroxysmal atrial fibrillation ([facility]) 09/02/2012   HTN (hypertension) 09/02/2012   Pacemaker 08/03/2012   Chest pain 07/31/2012   Leukocytosis, unspecified 07/31/2012   AKI (acute kidney injury) ([place]) 07/31/2012   Gout 07/31/2012   First degree AV block, PR interval 370 ms 07/19/2012   Chest pain, atypical  07/19/2012   Acute renal insufficiency 07/19/2012   Hypokalemia 07/19/2012   Syncope 07/19/2012   Bradycardia 07/19/2012   LUMBAR STRAIN 09/10/2008   ANEMIA, IRON DEFICIENCY NOS 08/16/2008   OSTEOARTHRITIS, MODERATE 09/21/2007   INSOMNIA 06/24/2007   Dyslipidemia 04/05/2007   DENTAL CARIES 10/08/2006   LATERAL MENISCUS TEAR, RIGHT 04/19/2006   MIGRAINE HEADACHE 12/18/2005   Essential hypertension 12/18/2005   GERD 12/18/2005   RENAL CALCULUS 12/18/2005   BENIGN PROSTATIC HYPERTROPHY, WITH OBSTRUCTION 12/18/2005   ARTHRITIS 12/18/2005   LOW BACK PAIN 12/18/2005   OSTEOPENIA 12/18/2005   COLON CANCER, HX OF 12/18/2005   PROSTATE CANCER, HX OF 12/18/2005    Past Surgical History:  Procedure Laterality Date   LEFT HEART CATHETERIZATION WITH CORONARY ANGIOGRAM N/A 07/21/2012   Procedure: LEFT HEART CATHETERIZATION WITH CORONARY ANGIOGRAM;  Surgeon: Troy Sine, MD;  Location: [facility] CATH LAB;  Service: Cardiovascular;  Laterality: N/A;   PACEMAKER INSERTION     PERMANENT PACEMAKER INSERTION N/A 07/22/2012   Procedure: PERMANENT PACEMAKER INSERTION;  Surgeon: Sanda Klein, MD;  Location: [facility] CATH LAB;  Service: Cardiovascular;  Laterality: N/A;   PROSTATE SURGERY         No family history on file.  Social History   Tobacco Use   Smoking status: Former    Types: Cigarettes    Quit date: 07/20/2002    Years since quitting: 18.5   Smokeless tobacco: Never  Vaping Use   Vaping Use: Never used  Substance Use Topics   Alcohol use: No   Drug use: No    Home Medications Prior to Admission medications   Medication Sig Start Date End Date Taking? Authorizing Provider  abiraterone acetate (ZYTIGA) 250 MG tablet Take 2 tablets (500 mg total) by mouth daily. Take on an empty stomach 1 hour before or 2 hours after a meal 01/15/21  Yes Derek Jack, MD  amLODipine (NORVASC) 10 MG tablet Take 10 mg by mouth daily.   Yes [provider]  atorvastatin (LIPITOR) 20 MG  tablet Take 1 tablet (20 mg total) by mouth daily at 6 PM. Patient taking differently: Take 20 mg by mouth at bedtime. 07/23/12  Yes Rosita Fire, Brittainy M, PA-C  carvedilol (COREG) 25 MG tablet Take 50 mg by mouth 2 (two) times daily with a meal.   Yes [provider]  Cholecalciferol 25 MCG (1000 UT) tablet Take 1,000 Units by mouth daily.   Yes [provider]  diclofenac sodium (VOLTAREN) 1 % GEL Apply 2 g topically 4 (four) times daily. Patient taking differently: Apply 2 g topically 4 (four) times daily as needed (pain). 06/19/17  Yes Rancour, Annie Main, MD  Ensure (ENSURE) Take 237 mLs by mouth daily as needed (nutrition).   Yes [provider]  ferrous sulfate 324 MG TBEC Take 324 mg by mouth 2 (two) times daily.   Yes [provider]  HYDROcodone-acetaminophen (NORCO/VICODIN) 5-325 MG tablet Take 1 tablet by mouth every 6 (six) hours as needed for moderate pain. 05/03/20  Yes Milton Ferguson, MD  megestrol (MEGACE) 400 MG/10ML suspension Take 10 mLs (400 mg total) by mouth 2 (two) times daily. 01/15/21  Yes Derek Jack, MD  omeprazole (PRILOSEC) 20 MG capsule Take 20 mg by mouth daily as needed (acid reflux).   Yes [provider]  potassium chloride SA (KLOR-CON) 20 MEQ tablet Take 20 mEq by mouth daily. 02/28/20  Yes [provider]  predniSONE (DELTASONE) 5 MG tablet Take 1 tablet (5 mg total) by mouth daily with breakfast. 01/15/21  Yes Derek Jack, MD  rivaroxaban (XARELTO) 20 MG TABS tablet Take 1 tablet (20 mg total) by mouth daily with supper. 10/30/13  Yes Croitoru, Mihai, MD  silodosin (RAPAFLO) 8 MG CAPS capsule Take 1 capsule (8 mg total) by mouth at bedtime. 01/21/21  Yes Festus Aloe, MD  traMADol (ULTRAM) 50 MG tablet Take 1 tablet (50 mg total) by mouth 2 (two) times daily as needed for moderate pain or severe pain. 10/30/20  Yes Derek Jack, MD  colchicine 0.6 MG tablet Take 1 tablet (0.6 mg total) by  mouth 2 (two) times daily as needed (gout). Patient not taking: Reported on 02/03/2021 02/18/17   Margarita Mail, PA-C  dronabinol (MARINOL) 2.5 MG capsule Take 1 capsule (2.5 mg total) by mouth 2 (two) times daily before a meal. Patient not taking: Reported on 02/03/2021 12/11/20   Derek Jack, MD  fluticasone [facility]) 50 MCG/ACT nasal spray Place 2 sprays into both nostrils daily. Patient not taking: Reported on 02/03/2021 02/15/16   Ward, Delice Bison, DO    Allergies    Patient has no known allergies.  Review of Systems   Review of Systems  Constitutional:  Negative for chills, fatigue and fever.  HENT:  Negative for trouble swallowing.   Respiratory:  Negative for cough, shortness of breath and wheezing.   Cardiovascular:  Negative for chest pain and palpitations.  Gastrointestinal:  Positive for  abdominal pain, nausea and vomiting. Negative for blood in stool and diarrhea.  Genitourinary:  Positive for flank pain. Negative for decreased urine volume, difficulty urinating, dysuria and hematuria.  Musculoskeletal:  Positive for back pain. Negative for arthralgias, myalgias, neck pain and neck stiffness.  Skin:  Negative for rash.  Neurological:  Negative for dizziness, weakness, numbness and headaches.  Hematological:  Does not bruise/bleed easily.   Physical Exam Updated Vital Signs BP (!) 174/67    Pulse (!) 55    Temp (!) 97.4 F (36.3 C) (Axillary)    Resp 19    Ht 6\' 1"  (1.854 m)    Wt 90.7 kg    SpO2 100%    BMI 26.39 kg/m   Physical Exam Vitals and nursing note reviewed.  Constitutional:      General: He is not in acute distress.    Appearance: Normal appearance. He is not ill-appearing.  HENT:     Mouth/Throat:     Mouth: Mucous membranes are moist.  Eyes:     Conjunctiva/sclera: Conjunctivae normal.     Pupils: Pupils are equal, round, and reactive to light.  Cardiovascular:     Rate and Rhythm: Normal rate and regular rhythm.     Pulses: Normal pulses.   Pulmonary:     Effort: Pulmonary effort is normal. No respiratory distress.  Chest:     Chest wall: No tenderness.  Abdominal:     Palpations: Abdomen is soft.     Tenderness: There is abdominal tenderness.     Comments: Mild ttp of the RLQ.  No guarding or rebound tenderness.  Abdomen is soft  Musculoskeletal:     Right lower leg: No edema.     Left lower leg: No edema.  Skin:    General: Skin is warm.     Capillary Refill: Capillary refill takes less than 2 seconds.  Neurological:     General: No focal deficit present.     Mental Status: He is alert.     Sensory: No sensory deficit.     Motor: No weakness.    ED Results / Procedures / Treatments   Labs (all labs ordered are listed, but only abnormal results are displayed) Labs Reviewed  COMPREHENSIVE METABOLIC PANEL - Abnormal; Notable for the following components:      Result Value   Sodium 132 (*)    CO2 21 (*)    Glucose, Bld 115 (*)    BUN 26 (*)    All other components within normal limits  CBC WITH DIFFERENTIAL/PLATELET - Abnormal; Notable for the following components:   RBC 3.66 (*)    Hemoglobin 12.1 (*)    HCT 35.9 (*)    All other components within normal limits  TROPONIN I (HIGH SENSITIVITY) - Abnormal; Notable for the following components:   Troponin I (High Sensitivity) 40 (*)    All other components within normal limits  RESP PANEL BY RT-PCR (FLU A&B, COVID) [id number]  TROPONIN I (HIGH SENSITIVITY)    EKG EKG Interpretation  Date/Time:  Monday February 03 2021 15:55:45 EST Ventricular Rate:  55 PR Interval:  76 QRS Duration: 188 QT Interval:  534 QTC Calculation: 511 R Axis:   -86 Text Interpretation: LBBB vs paced rhythm, unchanged from prior, known hx of pacemaker Short PR interval Nonspecific IVCD with LAD Left ventricular hypertrophy Confirmed by Octaviano Glow [id number] on 02/03/2021 5:44:11 PM  Radiology CT Angio Chest/Abd/Pel for Dissection W and/or Wo Contrast  Result Date:  02/03/2021 CLINICAL  DATA:  Right flank pain.  Acute aortic syndrome suspected. EXAM: CT ANGIOGRAPHY CHEST, ABDOMEN AND PELVIS TECHNIQUE: Non-contrast CT of the chest was initially obtained. Multidetector CT imaging through the chest, abdomen and pelvis was performed using the standard protocol during bolus administration of intravenous contrast. Multiplanar reconstructed images and MIPs were obtained and reviewed to evaluate the vascular anatomy. CONTRAST:  181mL OMNIPAQUE IOHEXOL 350 MG/ML SOLN COMPARISON:  Chest abdomen pelvis CT 06/12/2020. Abdomen/pelvis CT 10/25/2020. FINDINGS: CTA CHEST FINDINGS Cardiovascular: Pre contrast imaging shows no hyperdense crescent in the wall of the thoracic aorta to suggest acute intramural hematoma. Ascending thoracic aorta measures 4.5 cm diameter stable since 06/12/2020. No mural thickening or irregularity in the thoracic aorta. There is no dissection of the thoracic aorta. Branch vessel anatomy from the aortic arch opacifies normally. Left permanent pacemaker noted. Mediastinum/Nodes: No mediastinal lymphadenopathy. There is no hilar lymphadenopathy. The esophagus has normal imaging features. There is no axillary lymphadenopathy. Lungs/Pleura: No suspicious pulmonary nodule or mass. 4 mm peripheral left lower lobe pulmonary nodule identified previously is stable a on 88/7 today. No focal airspace consolidation. There is no evidence of pleural effusion. Musculoskeletal: No worrisome lytic or sclerotic osseous abnormality. Review of the MIP images confirms the above findings. CTA ABDOMEN AND PELVIS FINDINGS VASCULAR Aorta: Normal caliber aorta without aneurysm, dissection, vasculitis or significant stenosis. Celiac: Calcific ostial plaque. Patent without evidence of aneurysm, dissection, vasculitis or significant stenosis. SMA: Patent without evidence of aneurysm, dissection, vasculitis or significant stenosis. Renals: Both renal arteries are patent without evidence of  aneurysm, dissection, vasculitis, fibromuscular dysplasia or significant stenosis. IMA: Patent without evidence of aneurysm, dissection, vasculitis or significant stenosis. Inflow: Patent without evidence of aneurysm, dissection, vasculitis or significant stenosis. Right common iliac artery measures 1.9 cm diameter. Veins: No obvious venous abnormality within the limitations of this arterial phase study. Review of the MIP images confirms the above findings. NON-VASCULAR Hepatobiliary: Scattered tiny foci of arterial phase hyperenhancement are identified in both hepatic lobes, stable since 06/12/2020, likely benign and potentially vascular malformation or flash filling hemangiomas. Gallbladder surgically absent. No intrahepatic or extrahepatic biliary dilation. Pancreas: No focal mass lesion. No dilatation of the main duct. No intraparenchymal cyst. No peripancreatic edema. Spleen: No splenomegaly. No focal mass lesion. Adrenals/Urinary Tract: Stable 3.2 cm right adrenal mass. No left adrenal nodule or mass. Multiple bilateral renal lesions again noted. Previous CT of 09/16/2019 characterized enhancing lesions in both kidneys. 2.3 cm enhancing lesion lateral interpolar right kidney was 2.2 cm on that study. 2.2 cm lesion in the lower pole right kidney shows apparent enhancement, also stable since 09/16/2019. Right renal lesions are stable. No evidence for hydroureter. The urinary bladder appears normal for the degree of distention. Stomach/Bowel: Stomach is unremarkable. No gastric wall thickening. No evidence of outlet obstruction. Duodenum is normally positioned as is the ligament of Treitz. No small bowel wall thickening. No small bowel dilatation. Status post right hemicolectomy. No gross colonic mass. No colonic wall thickening. Lymphatic: No abdominal lymphadenopathy No pelvic sidewall lymphadenopathy. Reproductive: Brachytherapy seeds noted in the prostate gland. Other: No intraperitoneal free fluid.  Musculoskeletal: No worrisome lytic or sclerotic osseous abnormality. Degenerative changes noted in both hips, right greater than left. Review of the MIP images confirms the above findings. IMPRESSION: 1. No evidence for acute intramural hematoma or dissection of the thoracic aorta. There is no dissection of the thoracoabdominal aorta. 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  2. No acute findings in the chest, abdomen or pelvis. Specifically, no findings to explain the patient's history of right-sided flank pain. 3. Multiple bilateral renal lesions again noted with enhancing lesions in both kidneys as characterized previously. No substantial interval change. 4. Scattered foci of arterial phase hyperenhancement in the liver are unchanged. Continued attention on follow-up recommended. 5. Stable 3.2 cm right adrenal mass. 6. Status post right hemicolectomy. 7. Aortic Atherosclerosis (ICD10-I70.0). Electronically Signed   By: Misty Stanley M.D.   On: 02/03/2021 06:53    Procedures Procedures   Medications Ordered in ED Medications  ondansetron (ZOFRAN-ODT) disintegrating tablet 4 mg (4 mg Oral Given 02/03/21 1639)  HYDROmorphone (DILAUDID) injection 0.5 mg (0.5 mg Intramuscular Given 02/03/21 1639)    ED Course  I have reviewed the triage vital signs and the nursing notes.  Pertinent labs & imaging results that were available during my care of the patient were reviewed by me and considered in my medical decision making (see chart for details).    MDM Rules/Calculators/A&P                          Patient seen here initially this morning and evaluated for flank and abdominal pain.  He had work-up that included CT angio of the chest abdomen and pelvis without acute findings.  When patient returned home, abdominal pain worsened and now associated with nausea and vomiting.  Has history of prostate cancer and is currently  being treated by oncology and urology.  Denies any worsening dysuria.  On exam, patient complaining of right lower quadrant pain he has some mild right-sided tenderness on exam.  No active vomiting during my exam.  Mucous membranes are moist.  I have reviewed prior provider's  medical record including HPI, physical exam findings, imaging and laboratory studies.  Aortic aneurysm from CT angio stable and unchanged from April of this year.  At this time source of patient's abdominal and flank symptoms are unclear.  Will obtain repeat labs for comparison, EKG and troponin.  At this point, I am doubtful this is cardiac related process.  Patient also seen by Dr. Langston Masker and care plan discussed.  Given patient's age and persistent abdominal pain, I feel he would benefit from hospital admission for overnight observation  Discussed findings with [facility] hospitalist, Dr. Denton Brick who is agreeable to admit.     Final Clinical Impression(s) / ED Diagnoses Final diagnoses:  Right lower quadrant abdominal pain  Flank pain    Rx / DC Orders ED Discharge Orders     None        Kem Parkinson, PA-C 02/03/21 1845    Wyvonnia Dusky, MD 02/04/21 [phone]

## 2021-02-04 ENCOUNTER — Encounter (HOSPITAL_COMMUNITY): Payer: Self-pay | Admitting: Internal Medicine

## 2021-02-04 ENCOUNTER — Other Ambulatory Visit (HOSPITAL_COMMUNITY): Payer: No Typology Code available for payment source

## 2021-02-04 ENCOUNTER — Observation Stay (HOSPITAL_COMMUNITY): Payer: No Typology Code available for payment source

## 2021-02-04 DIAGNOSIS — R109 Unspecified abdominal pain: Secondary | ICD-10-CM | POA: Diagnosis not present

## 2021-02-04 DIAGNOSIS — R7989 Other specified abnormal findings of blood chemistry: Secondary | ICD-10-CM | POA: Diagnosis not present

## 2021-02-04 DIAGNOSIS — R17 Unspecified jaundice: Secondary | ICD-10-CM

## 2021-02-04 DIAGNOSIS — R1011 Right upper quadrant pain: Secondary | ICD-10-CM

## 2021-02-04 LAB — BILIRUBIN, FRACTIONATED(TOT/DIR/INDIR)
Bilirubin, Direct: 0.3 mg/dL — ABNORMAL HIGH (ref 0.0–0.2)
Indirect Bilirubin: 1.2 mg/dL — ABNORMAL HIGH (ref 0.3–0.9)
Total Bilirubin: 1.5 mg/dL — ABNORMAL HIGH (ref 0.3–1.2)

## 2021-02-04 LAB — COMPREHENSIVE METABOLIC PANEL
ALT: 52 U/L — ABNORMAL HIGH (ref 0–44)
AST: 52 U/L — ABNORMAL HIGH (ref 15–41)
Albumin: 3.1 g/dL — ABNORMAL LOW (ref 3.5–5.0)
Alkaline Phosphatase: 53 U/L (ref 38–126)
Anion gap: 7 (ref 5–15)
BUN: 23 mg/dL (ref 8–23)
CO2: 21 mmol/L — ABNORMAL LOW (ref 22–32)
Calcium: 9.5 mg/dL (ref 8.9–10.3)
Chloride: 105 mmol/L (ref 98–111)
Creatinine, Ser: 1.04 mg/dL (ref 0.61–1.24)
GFR, Estimated: >60 mL/min (ref >60)
Glucose, Bld: 86 mg/dL (ref 70–99)
Potassium: 4.1 mmol/L (ref 3.5–5.1)
Sodium: 133 mmol/L — ABNORMAL LOW (ref 135–145)
Total Bilirubin: 1.4 mg/dL — ABNORMAL HIGH (ref 0.3–1.2)
Total Protein: 6.6 g/dL (ref 6.5–8.1)

## 2021-02-04 LAB — CBC
HCT: 33.7 % — ABNORMAL LOW (ref 39.0–52.0)
Hemoglobin: 11.4 g/dL — ABNORMAL LOW (ref 13.0–17.0)
MCH: 33.2 pg (ref 26.0–34.0)
MCHC: 33.8 g/dL (ref 30.0–36.0)
MCV: 98.3 fL (ref 80.0–100.0)
Platelets: 215 10*3/uL (ref 150–400)
RBC: 3.43 MIL/uL — ABNORMAL LOW (ref 4.22–5.81)
RDW: 13.2 % (ref 11.5–15.5)
WBC: 7.1 10*3/uL (ref 4.0–10.5)
nRBC: 0 % (ref 0.0–0.2)

## 2021-02-04 MED ORDER — ONDANSETRON HCL 4 MG/2ML IJ SOLN: 4.0000 mg | Freq: Four times a day (QID) | INTRAMUSCULAR | Status: DC | PRN

## 2021-02-04 NOTE — Progress Notes (Addendum)
PROGRESS NOTE     Derrick Long, is a 87 y.o. male, DOB - 01-Mar-1926, YJE:[id number]  Admit date - 02/03/2021   Admitting Physician Ejiroghene Arlyce Dice, MD  Outpatient Primary MD for the patient is [facility] [place]  LOS - 0  Chief Complaint  Patient presents with   Back Pain       Brief Narrative:  87 y.o. male with medical history significant for PAFIB , s/p PPM on Xarelto, HTN, h/o  prostate cancer, s/p prior lap chole, history of colon malignancy s/p right hemicolectomy, and history of multifocal renal cell carcinoma which has been stable admitted on 02/03/2021 with abdominal pain and intractable emesis  Assessment & Plan:   Principal Problem:   Intractable abdominal pain Active Problems:   Essential hypertension   Bradycardia   Pacemaker   Paroxysmal atrial fibrillation (HCC)   Prostate cancer ([place])   Elevated bilirubin   Elevated LFTs   RUQ pain  1)Abdominal pain and intractable emesis----no melena, no diarrhea, no hematochezia, no fevers -GI input appreciated -Right upper quadrant ultrasound without acute findings, findings of postcholecystectomy status noted -CT abdomen and pelvis without findings that would explain patient's abdominal pain and intractable emesis- -Plans for EGD on 02/05/2021 -Protonix and as needed antiemetics for now  2)PAFIB and s/p PPM--Xarelto remains on hold since 02/02/2021 -Hopefully we can restart post EGD -Continue Coreg for rate control  3)Thoraxic Aorta Aneurysym---- ascending thoracic aorta measuring up to 4.5 cm diameter, stable since 06/12/2020. -Surveillance and outpatient follow-up advised  4)multifocal renal cell carcinoma/metastatic prostate cancer to right adrenal gland----imaging studies showed overall stable lesions/masses -PTA patient was on abiraterone and prednisone (started 11/30),  -Continue same and follow-up with oncology as outpatient.  5)Heart Murmur--present for almost 10 years, echo from 2014  with EF of 60 to 65% without significant valvular abnormalities at that time -Repeat echo pending  6)HTN--continue amlodipine and carvedilol  7) malignancy related anorexia--- continue Megace  Disposition/Need for in-Hospital Stay- patient unable to be discharged at this time due to intractable emesis, awaiting EGD on 02/05/2021 -Possible discharge home in 1 to 2 days if tolerating oral intake well and pending EGD findings  Disposition: The patient is from: Home              Anticipated d/c is to: Home              Anticipated d/c date is: 1 day              Patient currently is not medically stable to d/c. Barriers: Not Clinically Stable-   Code Status :  -  Code Status: Full Code   Family Communication:   (patient is alert, awake and coherent) son at bedside  Consults  :  Gi  DVT Prophylaxis  :   - SCDs  SCDs Start: 02/03/21 2103   Lab Results  Component Value Date   PLT 215 02/04/2021    Inpatient Medications  Scheduled Meds:  abiraterone acetate  500 mg Oral Daily   amLODipine  10 mg Oral Daily   carvedilol  50 mg Oral BID WC   megestrol  400 mg Oral BID   pantoprazole (PROTONIX) IV  40 mg Intravenous Q24H   predniSONE  5 mg Oral Q breakfast   tamsulosin  0.4 mg Oral QPC supper   Continuous Infusions: PRN Meds:.acetaminophen **OR** acetaminophen, feeding supplement, HYDROmorphone (DILAUDID) injection, polyethylene glycol, promethazine   Anti-infectives (From admission, onward)    None  Subjective: [name] today has no fevers,  No chest pain,   -Abdominal discomfort and nausea patient,  No further emesis in the last 6 hours -Tolerating some liquids  Objective: Vitals:   02/04/21 0038 02/04/21 0447 02/04/21 0449 02/04/21 1256  BP: (!) 161/63  (!) 150/64 (!) 152/71  Pulse: 63  66 65  Resp: 18  20 20   Temp: 98.5 F (36.9 C)  98.3 F (36.8 C) 98.1 F (36.7 C)  TempSrc: Oral  Oral Oral  SpO2: 96%  95% 100%  Weight:  83.9 kg     Height:  6\' 1"  (1.854 m)      Intake/Output Summary (Last 24 hours) at 02/04/2021 1822 Last data filed at 02/04/2021 1738 Gross per 24 hour  Intake 720.95 ml  Output 300 ml  Net 420.95 ml   Filed Weights   02/03/21 1239 02/04/21 0447  Weight: 90.7 kg 83.9 kg    Physical Exam  Gen:- Awake Alert,  in no apparent distress  HEENT:- [place].AT, No sclera icterus Neck-Supple Neck,No JVD,.  Lungs-  CTAB , fair symmetrical air movement CV- S1, S2 normal, regular, pacemaker in situ, 3/6 SM Abd-  +ve B.Sounds, Abd Soft, No tenderness,    Extremity/Skin:- No  edema, pedal pulses present  Psych-affect is appropriate, oriented x3 Neuro-generalized weakness, no new focal deficits, no tremors  Data Reviewed: I have personally reviewed following labs and imaging studies  CBC: Recent Labs  Lab 02/03/21 0437 02/03/21 1510 02/04/21 0459  WBC 7.7 8.5 7.1  NEUTROABS 5.0 5.7  --   HGB 12.1* 12.1* 11.4*  HCT 34.7* 35.9* 33.7*  MCV 99.1 98.1 98.3  PLT 232 223 300   Basic Metabolic Panel: Recent Labs  Lab 02/03/21 0437 02/03/21 1510 02/04/21 0459  NA 134* 132* 133*  K 4.0 4.2 4.1  CL 106 104 105  CO2 19* 21* 21*  GLUCOSE 122* 115* 86  BUN 31* 26* 23  CREATININE 1.31* 1.08 1.04  CALCIUM 9.5 9.8 9.5   GFR: Estimated Creatinine Clearance: 51.2 mL/min (by C-G formula based on SCr of 1.04 mg/dL). Liver Function Tests: Recent Labs  Lab 02/03/21 0437 02/03/21 1510 02/04/21 0459  AST 19 21 52*  ALT 20 22 52*  ALKPHOS 57 54 53  BILITOT 0.6 0.8 1.5*   1.4*  PROT 7.2 7.5 6.6  ALBUMIN 3.5 3.8 3.1*   Recent Labs  Lab 02/03/21 0437  LIPASE 35   No results for input(s): AMMONIA in the last 168 hours. Coagulation Profile: No results for input(s): INR, PROTIME in the last 168 hours. Cardiac Enzymes: No results for input(s): CKTOTAL, CKMB, CKMBINDEX, TROPONINI in the last 168 hours. BNP (last 3 results) No results for input(s): PROBNP in the last 8760 hours. HbA1C: No results  for input(s): HGBA1C in the last 72 hours. CBG: No results for input(s): GLUCAP in the last 168 hours. Lipid Profile: No results for input(s): CHOL, HDL, LDLCALC, TRIG, CHOLHDL, LDLDIRECT in the last 72 hours. Thyroid Function Tests: No results for input(s): TSH, T4TOTAL, FREET4, T3FREE, THYROIDAB in the last 72 hours. Anemia Panel: No results for input(s): VITAMINB12, FOLATE, FERRITIN, TIBC, IRON, RETICCTPCT in the last 72 hours. Urine analysis:    Component Value Date/Time   COLORURINE STRAW (A) 02/03/2021 0715   APPEARANCEUR CLEAR 02/03/2021 0715   APPEARANCEUR Clear 01/20/2021 0938   LABSPEC 1.019 02/03/2021 0715   PHURINE 6.0 02/03/2021 0715   GLUCOSEU NEGATIVE 02/03/2021 0715   HGBUR SMALL (A) 02/03/2021 0715   HGBUR negative  06/24/2007 [place] 02/03/2021 0715   BILIRUBINUR Negative 01/20/2021 [place] 02/03/2021 0715   PROTEINUR 30 (A) 02/03/2021 0715   UROBILINOGEN 1.0 07/29/2013 2032   NITRITE NEGATIVE 02/03/2021 0715   LEUKOCYTESUR NEGATIVE 02/03/2021 0715   Sepsis Labs: @LABRCNTIP (procalcitonin:4,lacticidven:4)  ) Recent Results (from the past 240 hour(s))  Resp Panel by RT-PCR (Flu A&B, Covid) Nasopharyngeal Swab     Status: None   Collection Time: 02/03/21  3:10 PM   Specimen: Nasopharyngeal Swab; Nasopharyngeal(NP) swabs in vial transport medium  Result Value Ref Range Status   SARS Coronavirus 2 by RT PCR NEGATIVE NEGATIVE Final    Comment: (NOTE) SARS-CoV-2 target nucleic acids are NOT DETECTED.  The SARS-CoV-2 RNA is generally detectable in upper respiratory specimens during the acute phase of infection. The lowest concentration of SARS-CoV-2 viral copies this assay can detect is 138 copies/mL. A negative result does not preclude SARS-Cov-2 infection and should not be used as the sole basis for treatment or other patient management decisions. A negative result may occur with  improper specimen collection/handling,  submission of specimen other than nasopharyngeal swab, presence of viral mutation(s) within the areas targeted by this assay, and inadequate number of viral copies(<138 copies/mL). A negative result must be combined with clinical observations, patient history, and epidemiological information. The expected result is Negative.  Fact Sheet for Patients:  EntrepreneurPulse.com.au  Fact Sheet for Healthcare Providers:  IncredibleEmployment.be  This test is no t yet approved or cleared by the [place] FDA and  has been authorized for detection and/or diagnosis of SARS-CoV-2 by FDA under an Emergency Use Authorization (EUA). This EUA will remain  in effect (meaning this test can be used) for the duration of the COVID-19 declaration under Section 564(b)(1) of the Act, 21 U.S.C.section 360bbb-3(b)(1), unless the authorization is terminated  or revoked sooner.       Influenza A by PCR NEGATIVE NEGATIVE Final   Influenza B by PCR NEGATIVE NEGATIVE Final    Comment: (NOTE) The Xpert Xpress SARS-CoV-2/FLU/RSV plus assay is intended as an aid in the diagnosis of influenza from Nasopharyngeal swab specimens and should not be used as a sole basis for treatment. Nasal washings and aspirates are unacceptable for Xpert Xpress SARS-CoV-2/FLU/RSV testing.  Fact Sheet for Patients: EntrepreneurPulse.com.au  Fact Sheet for Healthcare Providers: IncredibleEmployment.be  This test is not yet approved or cleared by the [place] FDA and has been authorized for detection and/or diagnosis of SARS-CoV-2 by FDA under an Emergency Use Authorization (EUA). This EUA will remain in effect (meaning this test can be used) for the duration of the COVID-19 declaration under Section 564(b)(1) of the Act, 21 U.S.C. section 360bbb-3(b)(1), unless the authorization is terminated or revoked.  Performed at [facility], [address] [place], [place] [address]       Radiology Studies: CT Angio Chest/Abd/Pel for Dissection W and/or Wo Contrast  Result Date: 02/03/2021 CLINICAL DATA:  Right flank pain.  Acute aortic syndrome suspected. EXAM: CT ANGIOGRAPHY CHEST, ABDOMEN AND PELVIS TECHNIQUE: Non-contrast CT of the chest was initially obtained. Multidetector CT imaging through the chest, abdomen and pelvis was performed using the standard protocol during bolus administration of intravenous contrast. Multiplanar reconstructed images and MIPs were obtained and reviewed to evaluate the vascular anatomy. CONTRAST:  13mL OMNIPAQUE IOHEXOL 350 MG/ML SOLN COMPARISON:  Chest abdomen pelvis CT 06/12/2020. Abdomen/pelvis CT 10/25/2020. FINDINGS: CTA CHEST FINDINGS Cardiovascular: Pre contrast imaging shows no hyperdense crescent in the wall of  the thoracic aorta to suggest acute intramural hematoma. Ascending thoracic aorta measures 4.5 cm diameter stable since 06/12/2020. No mural thickening or irregularity in the thoracic aorta. There is no dissection of the thoracic aorta. Branch vessel anatomy from the aortic arch opacifies normally. Left permanent pacemaker noted. Mediastinum/Nodes: No mediastinal lymphadenopathy. There is no hilar lymphadenopathy. The esophagus has normal imaging features. There is no axillary lymphadenopathy. Lungs/Pleura: No suspicious pulmonary nodule or mass. 4 mm peripheral left lower lobe pulmonary nodule identified previously is stable a on 88/7 today. No focal airspace consolidation. There is no evidence of pleural effusion. Musculoskeletal: No worrisome lytic or sclerotic osseous abnormality. Review of the MIP images confirms the above findings. CTA ABDOMEN AND PELVIS FINDINGS VASCULAR Aorta: Normal caliber aorta without aneurysm, dissection, vasculitis or significant stenosis. Celiac: Calcific ostial plaque. Patent without evidence of aneurysm, dissection, vasculitis or significant stenosis. SMA: Patent without  evidence of aneurysm, dissection, vasculitis or significant stenosis. Renals: Both renal arteries are patent without evidence of aneurysm, dissection, vasculitis, fibromuscular dysplasia or significant stenosis. IMA: Patent without evidence of aneurysm, dissection, vasculitis or significant stenosis. Inflow: Patent without evidence of aneurysm, dissection, vasculitis or significant stenosis. Right common iliac artery measures 1.9 cm diameter. Veins: No obvious venous abnormality within the limitations of this arterial phase study. Review of the MIP images confirms the above findings. NON-VASCULAR Hepatobiliary: Scattered tiny foci of arterial phase hyperenhancement are identified in both hepatic lobes, stable since 06/12/2020, likely benign and potentially vascular malformation or flash filling hemangiomas. Gallbladder surgically absent. No intrahepatic or extrahepatic biliary dilation. Pancreas: No focal mass lesion. No dilatation of the main duct. No intraparenchymal cyst. No peripancreatic edema. Spleen: No splenomegaly. No focal mass lesion. Adrenals/Urinary Tract: Stable 3.2 cm right adrenal mass. No left adrenal nodule or mass. Multiple bilateral renal lesions again noted. Previous CT of 09/16/2019 characterized enhancing lesions in both kidneys. 2.3 cm enhancing lesion lateral interpolar right kidney was 2.2 cm on that study. 2.2 cm lesion in the lower pole right kidney shows apparent enhancement, also stable since 09/16/2019. Right renal lesions are stable. No evidence for hydroureter. The urinary bladder appears normal for the degree of distention. Stomach/Bowel: Stomach is unremarkable. No gastric wall thickening. No evidence of outlet obstruction. Duodenum is normally positioned as is the ligament of Treitz. No small bowel wall thickening. No small bowel dilatation. Status post right hemicolectomy. No gross colonic mass. No colonic wall thickening. Lymphatic: No abdominal lymphadenopathy No pelvic  sidewall lymphadenopathy. Reproductive: Brachytherapy seeds noted in the prostate gland. Other: No intraperitoneal free fluid. Musculoskeletal: No worrisome lytic or sclerotic osseous abnormality. Degenerative changes noted in both hips, right greater than left. Review of the MIP images confirms the above findings. IMPRESSION: 1. No evidence for acute intramural hematoma or dissection of the thoracic aorta. There is no dissection of the thoracoabdominal aorta. 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 2. No acute findings in the chest, abdomen or pelvis. Specifically, no findings to explain the patient's history of right-sided flank pain. 3. Multiple bilateral renal lesions again noted with enhancing lesions in both kidneys as characterized previously. No substantial interval change. 4. Scattered foci of arterial phase hyperenhancement in the liver are unchanged. Continued attention on follow-up recommended. 5. Stable 3.2 cm right adrenal mass. 6. Status post right hemicolectomy. 7. Aortic Atherosclerosis (ICD10-I70.0). Electronically Signed   By: Misty Stanley M.D.   On: 02/03/2021 06:53   US Abdomen Limited RUQ (  LIVER/GB)  Result Date: 02/04/2021 CLINICAL DATA:  Abnormal LFTs EXAM: ULTRASOUND ABDOMEN LIMITED RIGHT UPPER QUADRANT COMPARISON:  CT 10/25/2020 FINDINGS: Gallbladder: Prior cholecystectomy Common bile duct: Diameter: Dilated, measuring 12 mm. This is stable when compared to prior CT, likely related to patient's age and post cholecystectomy state. Liver: No focal lesion identified. Within normal limits in parenchymal echogenicity. Portal vein is patent on color Doppler imaging with normal direction of blood flow towards the liver. Other: None. IMPRESSION: Prior cholecystectomy. Prominent common bile duct is stable likely related to age and post cholecystectomy state. No acute findings. Electronically Signed   By:  Rolm Baptise M.D.   On: 02/04/2021 09:15    Scheduled Meds:  abiraterone acetate  500 mg Oral Daily   amLODipine  10 mg Oral Daily   carvedilol  50 mg Oral BID WC   megestrol  400 mg Oral BID   pantoprazole (PROTONIX) IV  40 mg Intravenous Q24H   predniSONE  5 mg Oral Q breakfast   tamsulosin  0.4 mg Oral QPC supper   Continuous Infusions:   LOS: 0 days    Roxan Hockey M.D on 02/04/2021 at 6:22 PM  Go to [email or web address] - for contact info  [facility] - Office  [phone]  If 7PM-7AM, please contact night-coverage [email or web address] Password [facility] 02/04/2021, 6:22 PM

## 2021-02-04 NOTE — TOC Progression Note (Signed)
Transition of Care [facility]) - Progression Note    Patient Details  Name: JCEON ALVERIO MRN: [id number] Date of Birth: November 18, 1926  Transition of [facility]) CM/SW Contact  Boneta Lucks, RN Phone Number: 02/04/2021, 11:10 AM  Clinical Narrative:   Patient in observation for intractable abdominal pain. GI following. [place] notification done, [phone].   Expected Discharge Plan: Home/Self Care Barriers to Discharge: Continued Medical Work up  Expected Discharge Plan and Services Expected Discharge Plan: Home/Self Care

## 2021-02-04 NOTE — [facility] Oncology Navigator Note (Signed)
Oncology Discharge Planning Admission Note  [place] at [facility] Address: [address], [place] [address] Hours of Operation:  8am - 5pm, Monday - Friday  Clinic Contact Information:  [phone]  Oncology Care Team: Medical Oncologist:  Derek Jack   Dr Delton Coombes is aware of this hospital admission dated 02/03/2021, and the cancer center will follow Derrick Long inpatient care to assist with discharge planning as indicated by the oncologist.  We will reach out to you closer to discharge date to arrange your follow up care.  Disclaimer:  This [place] note does not imply a formal consult request has been made by the admitting attending for this admission or there will be an inpatient consult completed by oncology.  Please request oncology consults as per standard process as indicated.

## 2021-02-04 NOTE — Consult Note (Signed)
@LOGO @   Referring Provider: [facility]  Primary Care Physician:  [facility] Primary Gastroenterologist:  Dr. Jenetta Downer (previously unassigned)  Date of Admission: 02/03/21 Date of Consultation: 02/04/21  Reason for Consultation:  Abdominal pain  HPI:  Derrick Long is a 87 y.o. year old male with medication history of paroxysmal atrial fibrillation, pacemaker in place, HTN, HLD, acid reflux, colon cancer years ago s/p right hemicolectomy, chronic anemia, metastatic prostate cancer to right adrenal gland, and bilateral kidney masses concerning for renal cell carcinoma, following with oncology and currently on Abiraterone and prednisone (started 11/30), associated chronic right-sided back pain, poor appetite, who initially presented to the emergency room morning of 12/19 with reports of mid abdominal pain and flank pain that started in middle the night.  Blood work was unremarkable and CT angio chest abdomen pelvis with no acute findings to explain his pain.  He had improvement in his pain with supportive measures/analgesics and was discharged home.  He returned to the emergency room evening of 12/19 due to worsening pain reported to be right upper quadrant and right flank with associated vomiting.    ED Course:  Hemodynamically stable with hypertension.  Afebrile. CBC with stable hemoglobin at 12.1 with normocytic indices, white blood cell count 8.5, platelets 223 CMP remarkable for sodium 132, glucose 115, BUN 26, creatinine 1.08, LFTs within normal limits. Lipase WNL earlier on 12/19. UA negative earlier on 12/19.. Troponins flat. Respiratory panel negative. UDS positive for opiates (prescribed outpatient)  CT angio chest/abdomen/pelvis completed morning of 12/19: Mild aneurysmal dilation of ascending thoracic aorta measuring 4.5 cm, stable since April 2022, no acute findings in the chest, abdomen, pelvis.  Multiple bilateral renal lesions noted with enhancing  lesions in both kidneys without substantial interval change.  Scattered foci of arterial phase hyperenhancement in the liver, unchanged, likely benign and potentially vascular malformations or flash filling hemangiomas, s/p right hemicolectomy and cholecystectomy, no biliary dilation.  He was started on IV fluids, pain medications, Protonix daily.  Xarelto was placed on hold and GI consulted for further evaluation.  Consult: RUQ and right subscapular pain started around 9PM Sunday night while watching TV. Had dinner around 4:20 PM and had no symptoms at that time. Pain was sharp. Also noticed increased firmness in RUQ. No similar symptoms previously. No nausea or vomiting initially, but this came on when the pain returned Monday afternoon. No nausea, vomiting, or abdominal pain since moving up to the floor.   He does have history of chronic right back/flank pain intermittent for which he takes tramadol PRN, but reports this was different than anything he has ever experienced. No nausea or vomiting prior to this. Denies weight loss. Doesn't have a great appetite though reports since starting Megace, this is improving.  He is eating about 2 meals a day and drinks 1 Ensure daily.  Denies heartburn or reflux symptoms.  Admits to belching occasionally.  Denies dysphagia.  Denies NSAIDs.  He does take Tylenol of 2000 mg/day.  Also takes prednisone daily since 11/30.  No change in bowel habits. No trouble with constipation. May have a loose stool here and there. No brbpr or melena.   No prior EGD.  Colonoscopy with the [facility] within the last couple of years. Thinks he had some polyps.  Reports history of colon cancer more than 10 years ago s/p right hemicolectomy.  Last dose of Xarelto: Last dose was morning of 12/19.   No alcohol, drug use, or tobacco use.  In general, he is very functional for his age. Lives at home with his daughter. Able to do daily ADLs himself. Also mows his lawn with riding loan  mower.   Becomes tearful when telling me about losing his wife suddenly in his home while he was holding her 5 years ago. States he hasn't been the same since.   Past Medical History:  Diagnosis Date   Acid reflux    Anemia    Ex-cigarette smoker    Gout    Hyperlipemia    Hypertension    Paroxysmal atrial fibrillation ([place]) 2014   Prostate cancer ([place]) 6.23.2015   SEEDS 20 YEARS AGO   Prostate disease     Past Surgical History:  Procedure Laterality Date   LEFT HEART CATHETERIZATION WITH CORONARY ANGIOGRAM N/A 07/21/2012   Procedure: LEFT HEART CATHETERIZATION WITH CORONARY ANGIOGRAM;  Surgeon: Troy Sine, MD;  Location: [facility] CATH LAB;  Service: Cardiovascular;  Laterality: N/A;   PACEMAKER INSERTION     PERMANENT PACEMAKER INSERTION N/A 07/22/2012   Procedure: PERMANENT PACEMAKER INSERTION;  Surgeon: Sanda Klein, MD;  Location: [facility] CATH LAB;  Service: Cardiovascular;  Laterality: N/A;   PROSTATE SURGERY      Prior to Admission medications   Medication Sig Start Date End Date Taking? Authorizing Provider  abiraterone acetate (ZYTIGA) 250 MG tablet Take 2 tablets (500 mg total) by mouth daily. Take on an empty stomach 1 hour before or 2 hours after a meal 01/15/21  Yes Derek Jack, MD  amLODipine (NORVASC) 10 MG tablet Take 10 mg by mouth daily.   Yes [provider]  atorvastatin (LIPITOR) 20 MG tablet Take 1 tablet (20 mg total) by mouth daily at 6 PM. Patient taking differently: Take 20 mg by mouth at bedtime. 07/23/12  Yes Rosita Fire, Brittainy M, PA-C  carvedilol (COREG) 25 MG tablet Take 50 mg by mouth 2 (two) times daily with a meal.   Yes [provider]  Cholecalciferol 25 MCG (1000 UT) tablet Take 1,000 Units by mouth daily.   Yes [provider]  diclofenac sodium (VOLTAREN) 1 % GEL Apply 2 g topically 4 (four) times daily. Patient taking differently: Apply 2 g topically 4 (four) times daily as needed (pain). 06/19/17  Yes Rancour, Annie Main,  MD  Ensure (ENSURE) Take 237 mLs by mouth daily as needed (nutrition).   Yes [provider]  ferrous sulfate 324 MG TBEC Take 324 mg by mouth 2 (two) times daily.   Yes [provider]  HYDROcodone-acetaminophen (NORCO/VICODIN) 5-325 MG tablet Take 1 tablet by mouth every 6 (six) hours as needed for moderate pain. 05/03/20  Yes Milton Ferguson, MD  megestrol (MEGACE) 400 MG/10ML suspension Take 10 mLs (400 mg total) by mouth 2 (two) times daily. 01/15/21  Yes Derek Jack, MD  omeprazole (PRILOSEC) 20 MG capsule Take 20 mg by mouth daily as needed (acid reflux).   Yes [provider]  potassium chloride SA (KLOR-CON) 20 MEQ tablet Take 20 mEq by mouth daily. 02/28/20  Yes [provider]  predniSONE (DELTASONE) 5 MG tablet Take 1 tablet (5 mg total) by mouth daily with breakfast. 01/15/21  Yes Derek Jack, MD  rivaroxaban (XARELTO) 20 MG TABS tablet Take 1 tablet (20 mg total) by mouth daily with supper. 10/30/13  Yes Croitoru, Mihai, MD  silodosin (RAPAFLO) 8 MG CAPS capsule Take 1 capsule (8 mg total) by mouth at bedtime. 01/21/21  Yes Festus Aloe, MD  traMADol (ULTRAM) 50 MG tablet Take 1  tablet (50 mg total) by mouth 2 (two) times daily as needed for moderate pain or severe pain. 10/30/20  Yes Derek Jack, MD  colchicine 0.6 MG tablet Take 1 tablet (0.6 mg total) by mouth 2 (two) times daily as needed (gout). Patient not taking: Reported on 02/03/2021 02/18/17   Margarita Mail, PA-C  dronabinol (MARINOL) 2.5 MG capsule Take 1 capsule (2.5 mg total) by mouth 2 (two) times daily before a meal. Patient not taking: Reported on 02/03/2021 12/11/20   Derek Jack, MD  fluticasone [facility]) 50 MCG/ACT nasal spray Place 2 sprays into both nostrils daily. Patient not taking: Reported on 02/03/2021 02/15/16   Ward, Delice Bison, DO    Current Facility-Administered Medications  Medication Dose Route Frequency Provider Last Rate Last  Admin   0.9 %  sodium chloride infusion   Intravenous Continuous Emokpae, Ejiroghene E, MD 75 mL/hr at 02/03/21 2136 New Bag at 02/03/21 2136   abiraterone acetate (ZYTIGA) tablet 500 mg  500 mg Oral Daily Emokpae, Ejiroghene E, MD       acetaminophen (TYLENOL) tablet 650 mg  650 mg Oral Q6H PRN Emokpae, Ejiroghene E, MD       Or   acetaminophen (TYLENOL) suppository 650 mg  650 mg Rectal Q6H PRN Emokpae, Ejiroghene E, MD       amLODipine (NORVASC) tablet 10 mg  10 mg Oral Daily Emokpae, Ejiroghene E, MD       carvedilol (COREG) tablet 50 mg  50 mg Oral BID WC Emokpae, Ejiroghene E, MD       feeding supplement (ENSURE ENLIVE / ENSURE PLUS) liquid 237 mL  237 mL Oral Daily PRN Emokpae, Ejiroghene E, MD       HYDROmorphone (DILAUDID) injection 0.5 mg  0.5 mg Intravenous Q4H PRN Emokpae, Ejiroghene E, MD   0.5 mg at 02/03/21 2136   megestrol (MEGACE) 400 MG/10ML suspension 400 mg  400 mg Oral BID Emokpae, Ejiroghene E, MD   400 mg at 02/03/21 2136   pantoprazole (PROTONIX) injection 40 mg  40 mg Intravenous Q24H Emokpae, Ejiroghene E, MD   40 mg at 02/03/21 2138   polyethylene glycol (MIRALAX / GLYCOLAX) packet 17 g  17 g Oral Daily PRN Emokpae, Ejiroghene E, MD       predniSONE (DELTASONE) tablet 5 mg  5 mg Oral Q breakfast Emokpae, Ejiroghene E, MD       promethazine (PHENERGAN) tablet 12.5 mg  12.5 mg Oral Q6H PRN Emokpae, Ejiroghene E, MD       tamsulosin (FLOMAX) capsule 0.4 mg  0.4 mg Oral QPC supper Emokpae, Ejiroghene E, MD        Allergies as of 02/03/2021   (No Known Allergies)    History reviewed. No pertinent family history.  Social History   Socioeconomic History   Marital status: Married    Spouse name: Not on file   Number of children: Not on file   Years of education: Not on file   Highest education level: Not on file  Occupational History   Not on file  Tobacco Use   Smoking status: Former    Types: Cigarettes    Quit date: 07/20/2002    Years since quitting: 18.5    Smokeless tobacco: Never  Vaping Use   Vaping Use: Never used  Substance and Sexual Activity   Alcohol use: No   Drug use: No   Sexual activity: Not on file  Other Topics Concern   Not on file  Social History Narrative  Not on file   Social Determinants of Health   Financial Resource Strain: Low Risk    Difficulty of Paying Living Expenses: Not hard at all  Food Insecurity: No Food Insecurity   Worried About Charity fundraiser in the Last Year: Never true   [place] in the Last Year: Never true  Transportation Needs: No Transportation Needs   Lack of Transportation (Medical): No   Lack of Transportation (Non-Medical): No  Physical Activity: Insufficiently Active   Days of Exercise per Week: 3 days   Minutes of Exercise per Session: 30 min  Stress: No Stress Concern Present   Feeling of Stress : Not at all  Social Connections: Moderately Integrated   Frequency of Communication with Friends and Family: More than three times a week   Frequency of Social Gatherings with Friends and Family: More than three times a week   Attends Religious Services: More than 4 times per year   Active Member of Clubs or Organizations: No   Attends Archivist Meetings: 1 to 4 times per year   Marital Status: Widowed  Human resources officer Violence: Not At Risk   Fear of Current or Ex-Partner: No   Emotionally Abused: No   Physically Abused: No   Sexually Abused: No    Review of Systems: Gen: Denies fever, chills, l cold or flulike symptoms, presyncope, syncope. CV: Denies chest pain, heart palpitations. Resp: Denies shortness of breath or cough. GI: See HPI GU : Denies urinary burning, urinary frequency, urinary incontinence.  MS: Denies joint pain Derm: Denies rash Psych: Denies depression, anxiety Heme: See HPI  Physical Exam: Vital signs in last 24 hours: Temp:  [97.4 F (36.3 C)-98.5 F (36.9 C)] 98.3 F (36.8 C) (12/20 0449) Pulse Rate:  [54-66] 66 (12/20  0449) Resp:  [16-24] 20 (12/20 0449) BP: (144-196)/(56-124) 150/64 (12/20 0449) SpO2:  [95 %-100 %] 95 % (12/20 0449) FiO2 (%):  [21 %] 21 % (12/19 2103) Weight:  [83.9 kg-90.7 kg] 83.9 kg (12/20 0447) Last BM Date: 02/03/21 General:   Alert,  Well-developed, well-nourished, pleasant and cooperative in NAD Head:  Normocephalic and atraumatic. Eyes:  Sclera clear, no icterus.   Conjunctiva pink. Ears:  Normal auditory acuity. Lungs:  Clear throughout to auscultation.   No wheezes, crackles, or rhonchi. No acute distress. Heart:  Regular rate and rhythm. Systolic murmur appreciated, no clicks, rubs,  or gallops. Abdomen:  Sof and nondistended. Minimal TTP in RUQ to deep palpation. No masses, hepatosplenomegaly or hernias noted. Normal bowel sounds, without guarding, and without rebound.   Rectal:  Deferred   Msk:  Symmetrical without gross deformities. Normal posture. Extremities:  Without edema. Neurologic:  Alert and  oriented x4;  grossly normal neurologically. Skin:  Intact without significant lesions or rashes. Psych:  Normal mood and affect. Tearful when discussing death of his wife.   Intake/Output from previous day: 12/19 0701 - 12/20 0700 In: 321 [I.V.:321] Out: 300 [Urine:300] Intake/Output this shift: No intake/output data recorded.  Lab Results: Recent Labs    02/03/21 0437 02/03/21 1510 02/04/21 0459  WBC 7.7 8.5 7.1  HGB 12.1* 12.1* 11.4*  HCT 34.7* 35.9* 33.7*  PLT 232 223 215   BMET Recent Labs    02/03/21 0437 02/03/21 1510 02/04/21 0459  NA 134* 132* 133*  K 4.0 4.2 4.1  CL 106 104 105  CO2 19* 21* 21*  GLUCOSE 122* 115* 86  BUN 31* 26* 23  CREATININE 1.31* 1.08 1.04  CALCIUM 9.5 9.8 9.5   LFT Recent Labs    02/03/21 0437 02/03/21 1510 02/04/21 0459  PROT 7.2 7.5 6.6  ALBUMIN 3.5 3.8 3.1*  AST 19 21 52*  ALT 20 22 52*  ALKPHOS 57 54 53  BILITOT 0.6 0.8 1.4*    Studies/Results: CT Angio Chest/Abd/Pel for Dissection W and/or Wo  Contrast  Result Date: 02/03/2021 CLINICAL DATA:  Right flank pain.  Acute aortic syndrome suspected. EXAM: CT ANGIOGRAPHY CHEST, ABDOMEN AND PELVIS TECHNIQUE: Non-contrast CT of the chest was initially obtained. Multidetector CT imaging through the chest, abdomen and pelvis was performed using the standard protocol during bolus administration of intravenous contrast. Multiplanar reconstructed images and MIPs were obtained and reviewed to evaluate the vascular anatomy. CONTRAST:  185m OMNIPAQUE IOHEXOL 350 MG/ML SOLN COMPARISON:  Chest abdomen pelvis CT 06/12/2020. Abdomen/pelvis CT 10/25/2020. FINDINGS: CTA CHEST FINDINGS Cardiovascular: Pre contrast imaging shows no hyperdense crescent in the wall of the thoracic aorta to suggest acute intramural hematoma. Ascending thoracic aorta measures 4.5 cm diameter stable since 06/12/2020. No mural thickening or irregularity in the thoracic aorta. There is no dissection of the thoracic aorta. Branch vessel anatomy from the aortic arch opacifies normally. Left permanent pacemaker noted. Mediastinum/Nodes: No mediastinal lymphadenopathy. There is no hilar lymphadenopathy. The esophagus has normal imaging features. There is no axillary lymphadenopathy. Lungs/Pleura: No suspicious pulmonary nodule or mass. 4 mm peripheral left lower lobe pulmonary nodule identified previously is stable a on 88/7 today. No focal airspace consolidation. There is no evidence of pleural effusion. Musculoskeletal: No worrisome lytic or sclerotic osseous abnormality. Review of the MIP images confirms the above findings. CTA ABDOMEN AND PELVIS FINDINGS VASCULAR Aorta: Normal caliber aorta without aneurysm, dissection, vasculitis or significant stenosis. Celiac: Calcific ostial plaque. Patent without evidence of aneurysm, dissection, vasculitis or significant stenosis. SMA: Patent without evidence of aneurysm, dissection, vasculitis or significant stenosis. Renals: Both renal arteries are patent  without evidence of aneurysm, dissection, vasculitis, fibromuscular dysplasia or significant stenosis. IMA: Patent without evidence of aneurysm, dissection, vasculitis or significant stenosis. Inflow: Patent without evidence of aneurysm, dissection, vasculitis or significant stenosis. Right common iliac artery measures 1.9 cm diameter. Veins: No obvious venous abnormality within the limitations of this arterial phase study. Review of the MIP images confirms the above findings. NON-VASCULAR Hepatobiliary: Scattered tiny foci of arterial phase hyperenhancement are identified in both hepatic lobes, stable since 06/12/2020, likely benign and potentially vascular malformation or flash filling hemangiomas. Gallbladder surgically absent. No intrahepatic or extrahepatic biliary dilation. Pancreas: No focal mass lesion. No dilatation of the main duct. No intraparenchymal cyst. No peripancreatic edema. Spleen: No splenomegaly. No focal mass lesion. Adrenals/Urinary Tract: Stable 3.2 cm right adrenal mass. No left adrenal nodule or mass. Multiple bilateral renal lesions again noted. Previous CT of 09/16/2019 characterized enhancing lesions in both kidneys. 2.3 cm enhancing lesion lateral interpolar right kidney was 2.2 cm on that study. 2.2 cm lesion in the lower pole right kidney shows apparent enhancement, also stable since 09/16/2019. Right renal lesions are stable. No evidence for hydroureter. The urinary bladder appears normal for the degree of distention. Stomach/Bowel: Stomach is unremarkable. No gastric wall thickening. No evidence of outlet obstruction. Duodenum is normally positioned as is the ligament of Treitz. No small bowel wall thickening. No small bowel dilatation. Status post right hemicolectomy. No gross colonic mass. No colonic wall thickening. Lymphatic: No abdominal lymphadenopathy No pelvic sidewall lymphadenopathy. Reproductive: Brachytherapy seeds noted in the prostate gland. Other: No intraperitoneal  free fluid. Musculoskeletal:  No worrisome lytic or sclerotic osseous abnormality. Degenerative changes noted in both hips, right greater than left. Review of the MIP images confirms the above findings. IMPRESSION: 1. No evidence for acute intramural hematoma or dissection of the thoracic aorta. There is no dissection of the thoracoabdominal aorta. 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 2. No acute findings in the chest, abdomen or pelvis. Specifically, no findings to explain the patient's history of right-sided flank pain. 3. Multiple bilateral renal lesions again noted with enhancing lesions in both kidneys as characterized previously. No substantial interval change. 4. Scattered foci of arterial phase hyperenhancement in the liver are unchanged. Continued attention on follow-up recommended. 5. Stable 3.2 cm right adrenal mass. 6. Status post right hemicolectomy. 7. Aortic Atherosclerosis (ICD10-I70.0). Electronically Signed   By: Misty Stanley M.D.   On: 02/03/2021 06:53    Impression: 87 y.o. year old male with medication history of paroxysmal atrial fibrillation, pacemaker in place, HTN, HLD, acid reflux, colon cancer years ago s/p right hemicolectomy, chronic anemia, metastatic prostate cancer to right adrenal gland, and bilateral kidney masses concerning for renal cell carcinoma, following with oncology and currently on Abiraterone and prednisone (started 11/30), associated chronic right-sided back pain, poor appetite, who initially presented to the emergency room morning of 12/19 with reports of mid abdominal pain and flank pain that started in middle the night.  Blood work was unremarkable and CT angio chest abdomen pelvis with no acute findings to explain his pain.  He had improvement in his pain with supportive measures/analgesics and was discharged home.  He returned to the emergency room evening of 12/19 due to  worsening pain reported to be right upper quadrant and right flank with associated vomiting.  Laboratory evaluation again unrevealing.  GI consulted for further evaluation.  RUQ abdominal pain:  Acute onset RUQ abdominal pain radiating to right flank and right subscapular region x2 days with associated nausea and vomiting without hematemesis.  Denies any similar symptoms previously.  He does have chronic right flank/back pain likely related to metastatic  prostate cancer which he manages with tramadol PRN, but states the pain that started in his RUQ is much different than anything he has ever experienced.  He has also had a poor appetite as of late, improving some with Megace prescribed by Dr. Delton Coombes.  5 lb weight loss over the last 3 months. Denies any other significant GI symptoms such as GERD, history of N/V prior to this acute onset, dysphagia, change in bowel habits, BRBPR, or melena.  Denies NSAIDs, alcohol use, or tobacco use.  He does take prednisone daily as part of his cancer therapy. No prior EGD. Labs in the ED unrevealing, LFTs, lipase, white blood cell count within normal limits, hemoglobin at baseline.  CT angio chest/abdomen/pelvis with no acute findings, s/p cholecystectomy without biliary dilation, potentially vascular malformations or flash filling hemangiomas in the liver.  He is started on PPI daily, antiemetics, and analgesics, last received around 11:30 PM yesterday.  Today, he reports abdominal pain has essentially resolved and he has not had any recurrent nausea or vomiting. Labs today remarkable for mildly elevated LFTs with AST 52, ALT 52, alk phos 53, total bilirubin 1.4.   Etiology of his acute onset abdominal pain is unclear. As workup has been unrevealing, it would be reasonable to consider EGD to evaluate for gastritis, duodenitis, PUD, H. Pylori. This will also evaluate his decreased appetite. Notably, his  last dose of Xarelto was morning of 12/19, so EGD will need to be  postponed to tomorrow.   Mildly elevated liver enzymes are nonspecific at this time. Previously normal as outpatient. No significant risk factors for hepatitis, no alcohol, and only up to 1000 mg of tylenol as needed. He was recently started on Abiraterone (11/30) which has risk of hepatotoxicity. Will check RUQ [place] this morning and fractionate bilirubin. Will need to continue to monitor closely.   Plan: RUQ [place] Fractionate bilirubin Proceed with EGD with propofol with Dr. Jenetta Downer tomorrow. The risks, benefits, and alternatives have been discussed with the patient in detail. The patient states understanding and desires to proceed. Continue to hold Xarelto.  Last dose morning of 12/19. Clear liquid diet today as tolerated. NPO at midnight.  Continue PPI daily.  Repeat HFP tomorrow. If increasing liver enzymes, will need to consider further evaluation.  Continue analgesics and antiemetics PRN per hospitalist.     LOS: 0 days    02/04/2021, 7:30 AM   Aliene Altes, PA-C [facility]

## 2021-02-05 ENCOUNTER — Observation Stay (HOSPITAL_BASED_OUTPATIENT_CLINIC_OR_DEPARTMENT_OTHER): Payer: No Typology Code available for payment source

## 2021-02-05 ENCOUNTER — Encounter (HOSPITAL_COMMUNITY): Payer: Self-pay | Admitting: Internal Medicine

## 2021-02-05 ENCOUNTER — Observation Stay (HOSPITAL_COMMUNITY): Payer: No Typology Code available for payment source | Admitting: Anesthesiology

## 2021-02-05 ENCOUNTER — Encounter (HOSPITAL_COMMUNITY)
Admission: EM | Disposition: A | Discharge status: Home / Self Care | Payer: Self-pay | Source: Home / Self Care | Attending: Emergency Medicine

## 2021-02-05 DIAGNOSIS — R1013 Epigastric pain: Secondary | ICD-10-CM | POA: Diagnosis not present

## 2021-02-05 DIAGNOSIS — Z950 Presence of cardiac pacemaker: Secondary | ICD-10-CM

## 2021-02-05 DIAGNOSIS — K2970 Gastritis, unspecified, without bleeding: Secondary | ICD-10-CM | POA: Diagnosis not present

## 2021-02-05 DIAGNOSIS — K31819 Angiodysplasia of stomach and duodenum without bleeding: Secondary | ICD-10-CM | POA: Diagnosis not present

## 2021-02-05 DIAGNOSIS — R9431 Abnormal electrocardiogram [ECG] [EKG]: Secondary | ICD-10-CM | POA: Diagnosis not present

## 2021-02-05 DIAGNOSIS — R7989 Other specified abnormal findings of blood chemistry: Secondary | ICD-10-CM | POA: Diagnosis not present

## 2021-02-05 DIAGNOSIS — K219 Gastro-esophageal reflux disease without esophagitis: Secondary | ICD-10-CM

## 2021-02-05 DIAGNOSIS — I10 Essential (primary) hypertension: Secondary | ICD-10-CM | POA: Diagnosis not present

## 2021-02-05 DIAGNOSIS — R112 Nausea with vomiting, unspecified: Secondary | ICD-10-CM

## 2021-02-05 DIAGNOSIS — E440 Moderate protein-calorie malnutrition: Secondary | ICD-10-CM

## 2021-02-05 DIAGNOSIS — K3189 Other diseases of stomach and duodenum: Secondary | ICD-10-CM

## 2021-02-05 DIAGNOSIS — I480 Paroxysmal atrial fibrillation: Secondary | ICD-10-CM

## 2021-02-05 DIAGNOSIS — Z8546 Personal history of malignant neoplasm of prostate: Secondary | ICD-10-CM | POA: Diagnosis not present

## 2021-02-05 DIAGNOSIS — R109 Unspecified abdominal pain: Secondary | ICD-10-CM | POA: Diagnosis not present

## 2021-02-05 DIAGNOSIS — C61 Malignant neoplasm of prostate: Secondary | ICD-10-CM

## 2021-02-05 DIAGNOSIS — Z87891 Personal history of nicotine dependence: Secondary | ICD-10-CM | POA: Diagnosis not present

## 2021-02-05 DIAGNOSIS — Z20822 Contact with and (suspected) exposure to covid-19: Secondary | ICD-10-CM | POA: Diagnosis not present

## 2021-02-05 HISTORY — PX: ESOPHAGOGASTRODUODENOSCOPY (EGD) WITH PROPOFOL: SHX5813

## 2021-02-05 HISTORY — PX: BIOPSY: SHX5522

## 2021-02-05 LAB — ECHOCARDIOGRAM COMPLETE
AR max vel: 1.9 cm2
AV Area VTI: 1.84 cm2
AV Area mean vel: 1.92 cm2
AV Mean grad: 10.3 mmHg
AV Peak grad: 19.8 mmHg
Ao pk vel: 2.23 m/s
Area-P 1/2: 4.1 cm2
Height: 73 in
MV VTI: 3.31 cm2
S' Lateral: 2.3 cm
Weight: 2959.46 oz

## 2021-02-05 LAB — GLUCOSE, CAPILLARY
Glucose-Capillary: 88 mg/dL (ref 70–99)
Glucose-Capillary: 91 mg/dL (ref 70–99)
Glucose-Capillary: 93 mg/dL (ref 70–99)

## 2021-02-05 SURGERY — ESOPHAGOGASTRODUODENOSCOPY (EGD) WITH PROPOFOL
Anesthesia: General

## 2021-02-05 MED ORDER — LACTATED RINGERS IV SOLN: INTRAVENOUS | Status: DC

## 2021-02-05 MED ORDER — PANTOPRAZOLE SODIUM 40 MG PO TBEC: 40.0000 mg | DELAYED_RELEASE_TABLET | Freq: Two times a day (BID) | ORAL | 2 refills | Status: DC

## 2021-02-05 MED ORDER — PROPOFOL 10 MG/ML IV BOLUS
INTRAVENOUS | Status: AC
  Filled 2021-02-05: qty 20

## 2021-02-05 MED ORDER — EPHEDRINE 5 MG/ML INJ
INTRAVENOUS | Status: AC
  Filled 2021-02-05: qty 5

## 2021-02-05 MED ORDER — LIDOCAINE HCL (PF) 2 % IJ SOLN
INTRAMUSCULAR | Status: AC
  Filled 2021-02-05: qty 5

## 2021-02-05 MED ORDER — EPHEDRINE SULFATE 50 MG/ML IJ SOLN
INTRAMUSCULAR | Status: DC | PRN
  Administered 2021-02-05 (×2): 10 mg via INTRAVENOUS

## 2021-02-05 MED ORDER — ONDANSETRON 8 MG PO TBDP: 8.0000 mg | ORAL_TABLET | Freq: Three times a day (TID) | ORAL | 0 refills | Status: DC | PRN

## 2021-02-05 MED ORDER — PROPOFOL 500 MG/50ML IV EMUL
INTRAVENOUS | Status: DC | PRN
  Administered 2021-02-05: 50 ug/kg/min via INTRAVENOUS

## 2021-02-05 MED ORDER — SODIUM CHLORIDE 0.9 % IV SOLN: INTRAVENOUS | Status: DC

## 2021-02-05 NOTE — Op Note (Signed)
[facility] Patient Name: Derrick Long Procedure Date: 02/05/2021 12:49 PM MRN: [id number] Date of Birth: [birth year] Attending MD: Maylon Peppers ,  CSN: [id number] Age: 87 Admit Type: Outpatient Procedure:                Upper GI endoscopy Indications:              Epigastric abdominal pain, Nausea with vomiting Providers:                Maylon Peppers, Caprice Kluver, Raphael Gibney,                            Technician Referring MD:              Medicines:                Monitored Anesthesia Care Complications:            No immediate complications. Estimated Blood Loss:     Estimated blood loss: none. Procedure:                Pre-Anesthesia Assessment:                           - Prior to the procedure, a History and Physical                            was performed, and patient medications, allergies                            and sensitivities were reviewed. The patient's                            tolerance of previous anesthesia was reviewed.                           - The risks and benefits of the procedure and the                            sedation options and risks were discussed with the                            patient. All questions were answered and informed                            consent was obtained.                           - ASA Grade Assessment: II - A patient with mild                            systemic disease.                           After obtaining informed consent, the endoscope was                            passed under direct vision. Throughout the  procedure, the patient's blood pressure, pulse, and                            oxygen saturations were monitored continuously. The                            [id number]-H190 ([id number]) scope was introduced through the                            mouth, and advanced to the second part of duodenum.                            The upper GI endoscopy was accomplished without                             difficulty. The patient tolerated the procedure                            well. Scope In: Scope Out: Findings:      The examined esophagus was normal.      A single small angiodysplastic lesion with no bleeding was found in the       gastric body.      Segmental mild inflammation characterized by erythema was found in the       gastric antrum. Biopsies were taken with a cold forceps for Helicobacter       pylori testing.      A single 8 mm mucosal papule (nodule) with no bleeding and no stigmata       of recent bleeding was found in the prepyloric region of the stomach. It       had a whitish discoloration overlying it. Biopsies were taken with a       cold forceps for histology.      The examined duodenum was normal. Impression:               - Normal esophagus.                           - A single non-bleeding angiodysplastic lesion in                            the stomach.                           - Gastritis. Biopsied.                           - A single mucosal papule (nodule) found in the                            stomach. Biopsied.                           - Normal examined duodenum. Moderate Sedation:      Per Anesthesia Care Recommendation:           - Return patient to hospital ward for ongoing care.                           -  Resume previous diet.                           - Await pathology results.                           - Use Protonix (pantoprazole) 40 mg PO daily. Procedure Code(s):        --- Professional ---                           [phone], Esophagogastroduodenoscopy, flexible,                            transoral; with biopsy, single or multiple Diagnosis Code(s):        --- Professional ---                           K31.819, Angiodysplasia of stomach and duodenum                            without bleeding                           K29.70, Gastritis, unspecified, without bleeding                           K31.89, Other diseases  of stomach and duodenum                           R10.13, Epigastric pain                           R11.2, Nausea with vomiting, unspecified CPT copyright 2019 [employer]. All rights reserved. The codes documented in this report are preliminary and upon coder review may  be revised to meet current compliance requirements. Maylon Peppers, MD Maylon Peppers,  02/05/2021 1:16:21 PM This report has been signed electronically. Number of Addenda: 0

## 2021-02-05 NOTE — Anesthesia Postprocedure Evaluation (Signed)
Anesthesia Post Note  Patient: Waynard Edwards  Procedure(s) Performed: ESOPHAGOGASTRODUODENOSCOPY (EGD) WITH PROPOFOL BIOPSY  Patient location during evaluation: PACU Anesthesia Type: General Level of consciousness: awake and alert Pain management: pain level controlled Vital Signs Assessment: post-procedure vital signs reviewed and stable Respiratory status: spontaneous breathing Cardiovascular status: blood pressure returned to baseline and stable Postop Assessment: no apparent nausea or vomiting Anesthetic complications: no   No notable events documented.   Last Vitals:  Vitals:   02/05/21 1245 02/05/21 1318  BP: (!) 151/61   Pulse: 69 68  Resp: 18 (!) 26  Temp:    SpO2: 99% 97%    Last Pain:  Vitals:   02/05/21 1303  TempSrc:   PainSc: 0-No pain                 Darlene Bartelt

## 2021-02-05 NOTE — Discharge Planning (Signed)
Oncology Discharge Planning Note  [facility] Address: [address], [place] [address] Hours of Operation:  8am - 5pm, Monday - Friday  Clinic Contact Information:  [phone]  Oncology Care Team: Medical Oncologist:  Derek Jack  Patient Details: Name:  Derrick Long, Derrick Long MRN:   [id number] DOB:   Dec 19, 1926 Reason for Current Admission: Intractable abdominal pain  Discharge Planning Narrative: Notification of admission received by Renda Rolls, RN for [name].  Discharge follow-up appointments for oncology are current and available on the AVS and MyChart. Upon discharge from the hospital, hematology/oncology's post discharge plan of care for the outpatient setting is: 02/24/2021   [name] will be called within two business days after discharge to review hematology/oncology's plan of care for full understanding.    Outpatient Oncology Specific Care Only: Oncology appointment transportation needs addressed?:  not applicable Oncology medication management for symptom management addressed?:  not applicable Chemo Alert Card reviewed?:  not applicable Immunotherapy Alert Card reviewed?:  not applicable

## 2021-02-05 NOTE — Transfer of Care (Signed)
Immediate Anesthesia Transfer of Care Note  Patient: Waynard Edwards  Procedure(s) Performed: ESOPHAGOGASTRODUODENOSCOPY (EGD) WITH PROPOFOL BIOPSY  Patient Location: PACU  Anesthesia Type:General  Level of Consciousness: awake  Airway & Oxygen Therapy: Patient Spontanous Breathing  Post-op Assessment: Report given to RN  Post vital signs: Reviewed and stable  Last Vitals:  Vitals Value Taken Time  BP    Temp    Pulse 68 02/05/21 1319  Resp 29 02/05/21 1319  SpO2 98 % 02/05/21 1319  Vitals shown include unvalidated device data.  Last Pain:  Vitals:   02/05/21 1303  TempSrc:   PainSc: 0-No pain      Patients Stated Pain Goal: 6 (77/41/42 3953)  Complications: No notable events documented.

## 2021-02-05 NOTE — Anesthesia Preprocedure Evaluation (Addendum)
Anesthesia Evaluation  Patient identified by MRN, date of birth, ID band Patient awake    Reviewed: Allergy & Precautions, NPO status , Patient's Chart, lab work & pertinent test results, reviewed documented beta blocker date and time   Airway Mallampati: II  TM Distance: >3 FB Neck ROM: Full    Dental  (+) Edentulous Upper, Edentulous Lower   Pulmonary neg pulmonary ROS, former smoker,           Cardiovascular Exercise Tolerance: Good hypertension, Pt. on medications and Pt. on home beta blockers + dysrhythmias Atrial Fibrillation + pacemaker  Rhythm:Regular Rate:Normal + Systolic murmurs [id number] 19:12:24 [place] System-AP-ER ROUTINE RECORD 02/05/1927 (89 yr) Male Black Vent. rate 60 BPM PR interval 225 ms QRS duration 182 ms QT/QTcB 517/517 ms P-R-T axes 19 -88 79 Sinus rhythm Prolonged PR interval Nonspecific IVCD with LAD Left ventricular hypertrophy Anterolateral infarct, old Confirmed by Elnora Morrison [phone]) on 02/04/2021 7:09:02 AM   Neuro/Psych  Headaches, negative psych ROS   GI/Hepatic Neg liver ROS, GERD  Medicated,  Endo/Other  negative endocrine ROS  Renal/GU Renal InsufficiencyRenal disease  negative genitourinary   Musculoskeletal  (+) Arthritis , Osteoarthritis,    Abdominal   Peds negative pediatric ROS (+)  Hematology  (+) Blood dyscrasia, anemia ,   Anesthesia Other Findings   Reproductive/Obstetrics negative OB ROS                             Anesthesia Physical Anesthesia Plan  ASA: 3  Anesthesia Plan: General   Post-op Pain Management: Minimal or no pain anticipated   Induction: Intravenous  PONV Risk Score and Plan:   Airway Management Planned: Nasal Cannula and Natural Airway  Additional Equipment:   Intra-op Plan:   Post-operative Plan:   Informed Consent: I have reviewed the patients History and Physical, chart, labs and  discussed the procedure including the risks, benefits and alternatives for the proposed anesthesia with the patient or authorized representative who has indicated his/her understanding and acceptance.     Dental advisory given  Plan Discussed with:   Anesthesia Plan Comments:        Anesthesia Quick Evaluation

## 2021-02-05 NOTE — Discharge Summary (Signed)
Physician Discharge Summary  Derrick Long OZH:[id number] DOB: [birth year] DOA: 02/03/2021  PCP: [facility]  Admit date: 02/03/2021 Discharge date: 02/05/2021  Time spent: 35 minutes  Recommendations for Outpatient Follow-up:  Repeat CBC to follow hemoglobin trend Repeat basic metabolic panel to follow electrolytes and renal function.  Discharge Diagnoses:  Principal Problem:   Intractable abdominal pain Active Problems:   Essential hypertension   Bradycardia   Pacemaker   Paroxysmal atrial fibrillation (HCC)   Prostate cancer (HCC)   Elevated bilirubin   Elevated LFTs   RUQ pain Moderate protein calorie malnutrition  Discharge Condition: Stable and improved.  Discharged home with instruction to follow-up with PCP and GI as an outpatient.  CODE STATUS: Full code.  Diet recommendation: Heart healthy diet.  Filed Weights   02/03/21 1239 02/04/21 0447  Weight: 90.7 kg 83.9 kg    History of present illness:  As per H&P written by Dr. Denton Brick on 02/03/21 Derrick Long is a 87 y.o. male with medical history significant for hypertension, atrial fibrillation, prostate cancer. Initially presented to the ED early hours of this morning with complaints of right flank pain and right upper abdominal pain.  Reports abdominal pain started about 9 PM last night.  Blood work was unremarkable, CT abdomen chest and pelvis was negative for any acute findings to explain his pain.  With improvement in his pain he was discharged home. Patient came back to the ED this afternoon reports of worsening pain and now with vomiting.  He had 4 episodes of vomiting in the ED.  Pain is right upper abdomen and right flank.  He denies NSAID use.  Denies alcohol intake.  No blood in vomitus.  No black stools or blood in stools.  Pain is constant.  Abdominal pain is not related to presence or absence of food.  Denies history of GI bleed or peptic ulcer.  Does not think he has had an EGD done  before.  Last bowel movement was this morning at about 10:00. No pain with urination.   No chest pain.   Daughter Derrick Long is at bedside, but patient lives with his daughter Derrick Long.  Patient is awake alert oriented, independent of all ADLs and still drives.   ED Course: Temperature 97.4.  Heart rate mostly 50s.  Respiratory rate 16-24.  Blood pressure systolic 629B to 284X.  WBC 8.5.  Stable hemoglobin 12.1.  Sodium 132 otherwise unremarkable BMP.  COVID and influenza test negative.  Unremarkable liver enzymes.  Normal lipase 35.  Troponins 40 > 46.  Hospitalist to admit for intractable pain.  Hospital Course:  1-abdominal pain and intractable nausea/vomiting: In the setting of gastritis/GERD. -Improved with bowel rest, PPI -No active bleeding appreciated; positive angiodysplastic lesion and gastritis appreciated on EGD. -Given chronic history of anticoagulation and steroids; recommendation is to treat patient with PPI twice a day. -Advised to follow small multiple meals throughout the day to further assist with acid offering. -As needed antiemetics has been prescribed.  2-paroxysmal atrial fibrillation -S/p pacemaker implantation -Resume Xarelto -Continue Coreg for rate control.  3-hypertension -Overall stable -Continue amlodipine and carvedilol.  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  5-malignancy related anorexia/moderate protein calorie malnutrition -Continue Megace and Ensure.  6-thoracic aortic aneurysm -Ascending thoracic aorta measuring 4.9 cm in diameter -Continue outpatient surveillance and follow-up with vascular surgery -Condition is stable since June 12, 2020.  Procedures:  Endoscopy: Normal esophagus; none actively bleeding angioplastic lesion appreciated in the stomach; positive gastritis.  Consultations: GI  service  Discharge Exam: Vitals:   02/05/21 1330 02/05/21 1452  BP: (!) 108/44 (!) 155/71  Pulse: 65 68  Resp: (!) 23 18  Temp:  98.4 F (36.9 C)  SpO2: 99% 99%    General: Afebrile, no chest pain, no nausea, no vomiting, no abdominal pain currently.  Patient in no acute distress feeling ready to go home. Cardiovascular: Rate controlled, no rubs, no gallops, no JVD. Respiratory: Good air movement bilaterally; no using accessory muscle.  Good saturation on room air Abdomen: Soft, nontender, positive bowel sounds Extremities: No cyanosis or clubbing.  Discharge Instructions   Discharge Instructions     Diet - low sodium heart healthy   Complete by: As directed    Discharge instructions   Complete by: As directed    Maintain adequate hydration Take medications as prescribed Multiple small meals throughout the day recommended (in order to improve appetite and further assist with gastric acid buffering). Arrange follow-up with PCP in 10 days Follow-up with gastroenterology service as an outpatient (office will contact you with appointment details).      Allergies as of 02/05/2021   No Known Allergies      Medication List     STOP taking these medications    colchicine 0.6 MG tablet   dronabinol 2.5 MG capsule Commonly known as: MARINOL   omeprazole 20 MG capsule Commonly known as: PRILOSEC   traMADol 50 MG tablet Commonly known as: ULTRAM       TAKE these medications    abiraterone acetate 250 MG tablet Commonly known as: ZYTIGA Take 2 tablets (500 mg total) by mouth daily. Take on an empty stomach 1 hour before or 2 hours after a meal   amLODipine 10 MG tablet Commonly known as: NORVASC Take 10 mg by mouth daily.   atorvastatin 20 MG tablet Commonly known as: LIPITOR Take 1 tablet (20 mg total) by mouth daily at 6 PM. What changed: when to take this   carvedilol 25 MG tablet Commonly known as: COREG Take 50 mg by mouth 2 (two) times daily with  a meal.   Cholecalciferol 25 MCG (1000 UT) tablet Take 1,000 Units by mouth daily.   diclofenac sodium 1 % Gel Commonly known as: VOLTAREN Apply 2 g topically 4 (four) times daily. What changed:  when to take this reasons to take this   Ensure Take 237 mLs by mouth daily as needed (nutrition).   ferrous sulfate 324 MG Tbec Take 324 mg by mouth 2 (two) times daily.   fluticasone 50 MCG/ACT nasal spray Commonly known as: FLONASE Place 2 sprays into both nostrils daily.   HYDROcodone-acetaminophen 5-325 MG tablet Commonly known as: NORCO/VICODIN Take 1 tablet by mouth every 6 (six) hours as needed for moderate pain.   megestrol 400 MG/10ML suspension Commonly known as: MEGACE Take 10 mLs (400 mg total) by mouth 2 (two) times daily.   ondansetron 8 MG disintegrating tablet Commonly known as: ZOFRAN-ODT Take 1 tablet (8 mg total) by mouth every 8 (eight) hours as needed for nausea or vomiting.   pantoprazole 40 MG tablet Commonly known as: Protonix Take 1 tablet (40 mg total) by mouth 2 (two) times daily.   potassium chloride SA 20 MEQ tablet Commonly known as: KLOR-CON M Take 20 mEq by mouth daily.   predniSONE 5 MG tablet Commonly known as: DELTASONE Take 1 tablet (5 mg total)  by mouth daily with breakfast.   rivaroxaban 20 MG Tabs tablet Commonly known as: XARELTO Take 1 tablet (20 mg total) by mouth daily with supper.   silodosin 8 MG Caps capsule Commonly known as: Rapaflo Take 1 capsule (8 mg total) by mouth at bedtime.       No Known Allergies  Follow-up [facility]. Schedule an appointment as soon as possible for a visit in 10 day(s).   Specialty: General Practice Contact information: [place] [address] [phone]         Harvel Quale, MD Follow up.   Specialty: Gastroenterology Why: office will contcat you with appointment details Contact information: [address] [place] [address] [phone]                  The results of significant diagnostics from this hospitalization (including imaging, microbiology, ancillary and laboratory) are listed below for reference.    Significant Diagnostic Studies: ECHOCARDIOGRAM COMPLETE  Result Date: 02/05/2021    ECHOCARDIOGRAM REPORT   Patient Name:   WILFRED DAYRIT Date of Exam: 02/05/2021 Medical Rec #:  [id number]         Height:       73.0 in Accession #:    [id number]        Weight:       185.0 lb Date of Birth:  1926/11/27          BSA:          2.081 m Patient Age:    19 years          BP:           118/65 mmHg Patient Gender: M                 HR:           72 bpm. Exam Location:  [facility] Procedure: 2D Echo, Cardiac Doppler and Color Doppler Indications:    Abnormal ECG  History:        Patient has prior history of Echocardiogram examinations, most                 recent 07/20/2012. Pacemaker, Arrythmias:Atrial Fibrillation,                 Signs/Symptoms:Chest Pain; Risk Factors:Hypertension,                 Dyslipidemia and Former Smoker.  Sonographer:    Wenda Low Referring Phys: [id number] COURAGE EMOKPAE IMPRESSIONS  1. LV function is vigorous with near cavity obliteration during systole . Left ventricular ejection fraction, by estimation, is 70 to 75%. The left ventricle has hyperdynamic function. The left ventricle has no regional wall motion abnormalities. There is severe left ventricular hypertrophy. Left ventricular diastolic parameters are consistent with Grade I diastolic dysfunction (impaired relaxation). Elevated left atrial pressure.  2. Right ventricular systolic function is normal. The right ventricular size is normal. There is normal pulmonary artery systolic pressure.  3. Left atrial size was mild to moderately dilated.  4. Lead seen in RA. Right atrial size was mildly dilated.  5. Trivial mitral valve regurgitation.  6. AV is thickened, calcified with restricted motion Peak and  mean gradients through the valve are 19 and 9 mm Hg respectively. Dimensionless index is 0.53 Overall consistent with mild AS. Marland Kitchen The aortic valve is tricuspid. Aortic valve regurgitation is not visualized.  7. The inferior  vena cava is normal in size with greater than 50% respiratory variability, suggesting right atrial pressure of 3 mmHg. FINDINGS  Left Ventricle: LV function is vigorous with near cavity obliteration during systole. Left ventricular ejection fraction, by estimation, is 70 to 75%. The left ventricle has hyperdynamic function. The left ventricle has no regional wall motion abnormalities. The left ventricular internal cavity size was small. There is severe left ventricular hypertrophy. Left ventricular diastolic parameters are consistent with Grade I diastolic dysfunction (impaired relaxation). Elevated left atrial pressure. Right Ventricle: The right ventricular size is normal. Right vetricular wall thickness was not assessed. Right ventricular systolic function is normal. There is normal pulmonary artery systolic pressure. The tricuspid regurgitant velocity is 2.09 m/s, and with an assumed right atrial pressure of 3 mmHg, the estimated right ventricular systolic pressure is 10.1 mmHg. Left Atrium: Left atrial size was mild to moderately dilated. Right Atrium: Lead seen in RA. Right atrial size was mildly dilated. Pericardium: There is no evidence of pericardial effusion. Mitral Valve: There is mild thickening of the mitral valve leaflet(s). Mild mitral annular calcification. Trivial mitral valve regurgitation. MV peak gradient, 6.5 mmHg. The mean mitral valve gradient is 3.0 mmHg. Tricuspid Valve: The tricuspid valve is normal in structure. Tricuspid valve regurgitation is trivial. Aortic Valve: AV is thickened, calcified with restricted motion Peak and mean gradients through the valve are 19 and 9 mm Hg respectively. Dimensionless index is 0.53 Overall consistent with mild AS. The aortic valve is  tricuspid. Aortic valve regurgitation is not visualized. Aortic valve mean gradient measures 10.2 mmHg. Aortic valve peak gradient measures 19.8 mmHg. Aortic valve area, by VTI measures 1.84 cm. Pulmonic Valve: The pulmonic valve was normal in structure. Pulmonic valve regurgitation is trivial. Aorta: The aortic root is normal in size and structure. Venous: The inferior vena cava is normal in size with greater than 50% respiratory variability, suggesting right atrial pressure of 3 mmHg. IAS/Shunts: No atrial level shunt detected by color flow Doppler.  LEFT VENTRICLE PLAX 2D LVIDd:         3.30 cm   Diastology LVIDs:         2.30 cm   LV e' medial:    3.16 cm/s LV PW:         1.80 cm   LV E/e' medial:  22.0 LV IVS:        1.90 cm   LV e' lateral:   7.05 cm/s LVOT diam:     2.10 cm   LV E/e' lateral: 9.8 LV SV:         85 LV SV Index:   41 LVOT Area:     3.46 cm  RIGHT VENTRICLE RV Basal diam:  4.20 cm RV Mid diam:    3.10 cm RV S prime:     9.41 cm/s TAPSE (M-mode): 2.3 cm LEFT ATRIUM              Index        RIGHT ATRIUM           Index LA diam:        3.20 cm  1.54 cm/m   RA Area:     23.10 cm LA Vol (A2C):   113.0 ml 54.29 ml/m  RA Volume:   70.50 ml  33.87 ml/m LA Vol (A4C):   78.8 ml  37.86 ml/m LA Biplane Vol: 98.8 ml  47.47 ml/m  AORTIC VALVE  PULMONIC VALVE AV Area (Vmax):    1.90 cm      PV Vmax:       1.00 m/s AV Area (Vmean):   1.92 cm      PV Peak grad:  4.0 mmHg AV Area (VTI):     1.84 cm AV Vmax:           222.75 cm/s AV Vmean:          148.250 cm/s AV VTI:            0.462 m AV Peak Grad:      19.8 mmHg AV Mean Grad:      10.2 mmHg LVOT Vmax:         122.00 cm/s LVOT Vmean:        82.300 cm/s LVOT VTI:          0.245 m LVOT/AV VTI ratio: 0.53  AORTA Ao Root diam: 3.40 cm Ao Asc diam:  3.50 cm MITRAL VALVE                TRICUSPID VALVE MV Area (PHT): 4.10 cm     TR Peak grad:   17.5 mmHg MV Area VTI:   3.31 cm     TR Vmax:        209.00 cm/s MV Peak grad:  6.5 mmHg MV  Mean grad:  3.0 mmHg     SHUNTS MV Vmax:       1.27 m/s     Systemic VTI:  0.24 m MV Vmean:      69.6 cm/s    Systemic Diam: 2.10 cm MV Decel Time: 185 msec MV E velocity: 69.40 cm/s MV A velocity: 115.00 cm/s MV E/A ratio:  0.60 Dorris Carnes MD Electronically signed by Dorris Carnes MD Signature Date/Time: 02/05/2021/3:15:32 PM    Final    CT Angio Chest/Abd/Pel for Dissection W and/or Wo Contrast  Result Date: 02/03/2021 CLINICAL DATA:  Right flank pain.  Acute aortic syndrome suspected. EXAM: CT ANGIOGRAPHY CHEST, ABDOMEN AND PELVIS TECHNIQUE: Non-contrast CT of the chest was initially obtained. Multidetector CT imaging through the chest, abdomen and pelvis was performed using the standard protocol during bolus administration of intravenous contrast. Multiplanar reconstructed images and MIPs were obtained and reviewed to evaluate the vascular anatomy. CONTRAST:  144mL OMNIPAQUE IOHEXOL 350 MG/ML SOLN COMPARISON:  Chest abdomen pelvis CT 06/12/2020. Abdomen/pelvis CT 10/25/2020. FINDINGS: CTA CHEST FINDINGS Cardiovascular: Pre contrast imaging shows no hyperdense crescent in the wall of the thoracic aorta to suggest acute intramural hematoma. Ascending thoracic aorta measures 4.5 cm diameter stable since 06/12/2020. No mural thickening or irregularity in the thoracic aorta. There is no dissection of the thoracic aorta. Branch vessel anatomy from the aortic arch opacifies normally. Left permanent pacemaker noted. Mediastinum/Nodes: No mediastinal lymphadenopathy. There is no hilar lymphadenopathy. The esophagus has normal imaging features. There is no axillary lymphadenopathy. Lungs/Pleura: No suspicious pulmonary nodule or mass. 4 mm peripheral left lower lobe pulmonary nodule identified previously is stable a on 88/7 today. No focal airspace consolidation. There is no evidence of pleural effusion. Musculoskeletal: No worrisome lytic or sclerotic osseous abnormality. Review of the MIP images confirms the above  findings. CTA ABDOMEN AND PELVIS FINDINGS VASCULAR Aorta: Normal caliber aorta without aneurysm, dissection, vasculitis or significant stenosis. Celiac: Calcific ostial plaque. Patent without evidence of aneurysm, dissection, vasculitis or significant stenosis. SMA: Patent without evidence of aneurysm, dissection, vasculitis or significant stenosis. Renals: Both renal arteries are patent without evidence of aneurysm, dissection, vasculitis,  fibromuscular dysplasia or significant stenosis. IMA: Patent without evidence of aneurysm, dissection, vasculitis or significant stenosis. Inflow: Patent without evidence of aneurysm, dissection, vasculitis or significant stenosis. Right common iliac artery measures 1.9 cm diameter. Veins: No obvious venous abnormality within the limitations of this arterial phase study. Review of the MIP images confirms the above findings. NON-VASCULAR Hepatobiliary: Scattered tiny foci of arterial phase hyperenhancement are identified in both hepatic lobes, stable since 06/12/2020, likely benign and potentially vascular malformation or flash filling hemangiomas. Gallbladder surgically absent. No intrahepatic or extrahepatic biliary dilation. Pancreas: No focal mass lesion. No dilatation of the main duct. No intraparenchymal cyst. No peripancreatic edema. Spleen: No splenomegaly. No focal mass lesion. Adrenals/Urinary Tract: Stable 3.2 cm right adrenal mass. No left adrenal nodule or mass. Multiple bilateral renal lesions again noted. Previous CT of 09/16/2019 characterized enhancing lesions in both kidneys. 2.3 cm enhancing lesion lateral interpolar right kidney was 2.2 cm on that study. 2.2 cm lesion in the lower pole right kidney shows apparent enhancement, also stable since 09/16/2019. Right renal lesions are stable. No evidence for hydroureter. The urinary bladder appears normal for the degree of distention. Stomach/Bowel: Stomach is unremarkable. No gastric wall thickening. No evidence of  outlet obstruction. Duodenum is normally positioned as is the ligament of Treitz. No small bowel wall thickening. No small bowel dilatation. Status post right hemicolectomy. No gross colonic mass. No colonic wall thickening. Lymphatic: No abdominal lymphadenopathy No pelvic sidewall lymphadenopathy. Reproductive: Brachytherapy seeds noted in the prostate gland. Other: No intraperitoneal free fluid. Musculoskeletal: No worrisome lytic or sclerotic osseous abnormality. Degenerative changes noted in both hips, right greater than left. Review of the MIP images confirms the above findings. IMPRESSION: 1. No evidence for acute intramural hematoma or dissection of the thoracic aorta. There is no dissection of the thoracoabdominal aorta. 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 2. No acute findings in the chest, abdomen or pelvis. Specifically, no findings to explain the patient's history of right-sided flank pain. 3. Multiple bilateral renal lesions again noted with enhancing lesions in both kidneys as characterized previously. No substantial interval change. 4. Scattered foci of arterial phase hyperenhancement in the liver are unchanged. Continued attention on follow-up recommended. 5. Stable 3.2 cm right adrenal mass. 6. Status post right hemicolectomy. 7. Aortic Atherosclerosis (ICD10-I70.0). Electronically Signed   By: Misty Stanley M.D.   On: 02/03/2021 06:53   US Abdomen Limited RUQ (LIVER/GB)  Result Date: 02/04/2021 CLINICAL DATA:  Abnormal LFTs EXAM: ULTRASOUND ABDOMEN LIMITED RIGHT UPPER QUADRANT COMPARISON:  CT 10/25/2020 FINDINGS: Gallbladder: Prior cholecystectomy Common bile duct: Diameter: Dilated, measuring 12 mm. This is stable when compared to prior CT, likely related to patient's age and post cholecystectomy state. Liver: No focal lesion identified. Within normal limits in parenchymal echogenicity. Portal vein is  patent on color Doppler imaging with normal direction of blood flow towards the liver. Other: None. IMPRESSION: Prior cholecystectomy. Prominent common bile duct is stable likely related to age and post cholecystectomy state. No acute findings. Electronically Signed   By: Rolm Baptise M.D.   On: 02/04/2021 09:15    Microbiology: Recent Results (from the past 240 hour(s))  Resp Panel by RT-PCR (Flu A&B, Covid) Nasopharyngeal Swab     Status: None   Collection Time: 02/03/21  3:10 PM   Specimen: Nasopharyngeal Swab; Nasopharyngeal(NP) swabs in vial transport medium  Result Value Ref Range Status   SARS Coronavirus 2 by RT  PCR NEGATIVE NEGATIVE Final    Comment: (NOTE) SARS-CoV-2 target nucleic acids are NOT DETECTED.  The SARS-CoV-2 RNA is generally detectable in upper respiratory specimens during the acute phase of infection. The lowest concentration of SARS-CoV-2 viral copies this assay can detect is 138 copies/mL. A negative result does not preclude SARS-Cov-2 infection and should not be used as the sole basis for treatment or other patient management decisions. A negative result may occur with  improper specimen collection/handling, submission of specimen other than nasopharyngeal swab, presence of viral mutation(s) within the areas targeted by this assay, and inadequate number of viral copies(<138 copies/mL). A negative result must be combined with clinical observations, patient history, and epidemiological information. The expected result is Negative.  Fact Sheet for Patients:  EntrepreneurPulse.com.au  Fact Sheet for Healthcare Providers:  IncredibleEmployment.be  This test is no t yet approved or cleared by the [place] FDA and  has been authorized for detection and/or diagnosis of SARS-CoV-2 by FDA under an Emergency Use Authorization (EUA). This EUA will remain  in effect (meaning this test can be used) for the duration of  the COVID-19 declaration under Section 564(b)(1) of the Act, 21 U.S.C.section 360bbb-3(b)(1), unless the authorization is terminated  or revoked sooner.       Influenza A by PCR NEGATIVE NEGATIVE Final   Influenza B by PCR NEGATIVE NEGATIVE Final    Comment: (NOTE) The Xpert Xpress SARS-CoV-2/FLU/RSV plus assay is intended as an aid in the diagnosis of influenza from Nasopharyngeal swab specimens and should not be used as a sole basis for treatment. Nasal washings and aspirates are unacceptable for Xpert Xpress SARS-CoV-2/FLU/RSV testing.  Fact Sheet for Patients: EntrepreneurPulse.com.au  Fact Sheet for Healthcare Providers: IncredibleEmployment.be  This test is not yet approved or cleared by the [place] FDA and has been authorized for detection and/or diagnosis of SARS-CoV-2 by FDA under an Emergency Use Authorization (EUA). This EUA will remain in effect (meaning this test can be used) for the duration of the COVID-19 declaration under Section 564(b)(1) of the Act, 21 U.S.C. section 360bbb-3(b)(1), unless the authorization is terminated or revoked.  Performed at [facility], [address], [place], [place] [address]      Labs: Basic Metabolic Panel: Recent Labs  Lab 02/03/21 0437 02/03/21 1510 02/04/21 0459  NA 134* 132* 133*  K 4.0 4.2 4.1  CL 106 104 105  CO2 19* 21* 21*  GLUCOSE 122* 115* 86  BUN 31* 26* 23  CREATININE 1.31* 1.08 1.04  CALCIUM 9.5 9.8 9.5   Liver Function Tests: Recent Labs  Lab 02/03/21 0437 02/03/21 1510 02/04/21 0459  AST 19 21 52*  ALT 20 22 52*  ALKPHOS 57 54 53  BILITOT 0.6 0.8 1.5*   1.4*  PROT 7.2 7.5 6.6  ALBUMIN 3.5 3.8 3.1*   Recent Labs  Lab 02/03/21 0437  LIPASE 35   CBC: Recent Labs  Lab 02/03/21 0437 02/03/21 1510 02/04/21 0459  WBC 7.7 8.5 7.1  NEUTROABS 5.0 5.7  --   HGB 12.1* 12.1* 11.4*  HCT 34.7* 35.9* 33.7*  MCV 99.1 98.1 98.3  PLT 232 223 215    CBG: Recent Labs  Lab 02/05/21 0009 02/05/21 0523 02/05/21 1126  GLUCAP 88 91 93    Signed:  Barton Dubois MD.  [facility] 02/05/2021, 3:59 PM

## 2021-02-05 NOTE — Progress Notes (Signed)
Patient and family state understanding of discharge instructions 

## 2021-02-05 NOTE — Progress Notes (Signed)
We will proceed with EGD as scheduled.  I thoroughly discussed with the patient his procedure, including the risks involved. Patient understands what the procedure involves including the benefits and any risks. Patient understands alternatives to the proposed procedure. Risks including (but not limited to) bleeding, tearing of the lining (perforation), rupture of adjacent organs, problems with heart and lung function, infection, and medication reactions. A small percentage of complications may require surgery, hospitalization, repeat endoscopic procedure, and/or transfusion.  Patient understood and agreed.  Cherolyn Behrle Castaneda, MD Gastroenterology and Hepatology [facility]  

## 2021-02-05 NOTE — Brief Op Note (Signed)
02/03/2021 - 02/05/2021  1:10 PM  PATIENT:  Derrick Long  87 y.o. male  PRE-OPERATIVE DIAGNOSIS:  RUQ abdominal pain, decreased appetite  POST-OPERATIVE DIAGNOSIS:  Gastric AVM; Gastric nodule;   PROCEDURE:  Procedure(s): ESOPHAGOGASTRODUODENOSCOPY (EGD) WITH PROPOFOL (N/A) BIOPSY  SURGEON:  Surgeon(s) and Role:    * Harvel Quale, MD - Primary  Patient underwent EGD under propofol sedation.  Tolerated the procedure adequately.  Esophagus was normal.  Stomach had presence of mild gastritis in the antrum characterized by erythema.  Biopsies were taken from the body and antrum to rule out H. pylori.  There was presence of a whitish nodule in the prepyloric area which was biopsied.  Normal duodenum.  RECOMMENDATIONS - Return patient to hospital ward for ongoing care.  - Resume previous diet.  - Await pathology results.  - Use Protonix (pantoprazole) 40 mg PO daily.  - GI service will sign-off, please call us back if you have any more questions.   Maylon Peppers, MD Gastroenterology and Hepatology [facility]

## 2021-02-05 NOTE — Progress Notes (Signed)
*  PRELIMINARY RESULTS* Echocardiogram 2D Echocardiogram has been performed.  Derrick Long 02/05/2021, 11:18 AM

## 2021-02-06 ENCOUNTER — Inpatient Hospital Stay (HOSPITAL_COMMUNITY): Payer: No Typology Code available for payment source

## 2021-02-06 ENCOUNTER — Ambulatory Visit (HOSPITAL_COMMUNITY): Payer: No Typology Code available for payment source | Admitting: Hematology

## 2021-02-06 LAB — SURGICAL PATHOLOGY

## 2021-02-11 ENCOUNTER — Encounter (HOSPITAL_COMMUNITY): Payer: Self-pay | Admitting: Gastroenterology

## 2021-02-14 ENCOUNTER — Encounter (HOSPITAL_COMMUNITY): Payer: Self-pay | Admitting: Hematology

## 2021-02-16 ENCOUNTER — Other Ambulatory Visit: Payer: Self-pay

## 2021-02-16 ENCOUNTER — Emergency Department (HOSPITAL_COMMUNITY): Payer: No Typology Code available for payment source

## 2021-02-16 ENCOUNTER — Encounter (HOSPITAL_COMMUNITY): Payer: Self-pay

## 2021-02-16 ENCOUNTER — Emergency Department (HOSPITAL_COMMUNITY)
Admission: EM | Admit: 2021-02-16 | Discharge: 2021-02-16 | Disposition: A | Discharge status: Home / Self Care | Payer: No Typology Code available for payment source | Attending: Emergency Medicine | Admitting: Emergency Medicine

## 2021-02-16 DIAGNOSIS — Z7901 Long term (current) use of anticoagulants: Secondary | ICD-10-CM | POA: Diagnosis not present

## 2021-02-16 DIAGNOSIS — I4891 Unspecified atrial fibrillation: Secondary | ICD-10-CM | POA: Diagnosis not present

## 2021-02-16 DIAGNOSIS — W19XXXA Unspecified fall, initial encounter: Secondary | ICD-10-CM

## 2021-02-16 DIAGNOSIS — I10 Essential (primary) hypertension: Secondary | ICD-10-CM | POA: Insufficient documentation

## 2021-02-16 DIAGNOSIS — Z79899 Other long term (current) drug therapy: Secondary | ICD-10-CM | POA: Insufficient documentation

## 2021-02-16 DIAGNOSIS — R531 Weakness: Secondary | ICD-10-CM | POA: Diagnosis not present

## 2021-02-16 DIAGNOSIS — R519 Headache, unspecified: Secondary | ICD-10-CM | POA: Insufficient documentation

## 2021-02-16 NOTE — ED Provider Notes (Signed)
[facility] EMERGENCY DEPARTMENT Provider Note   CSN: [id number] Arrival date & time: 02/16/21  1048     History  Chief Complaint  Patient presents with   Weakness    Derrick Long is a 88 y.o. male.  Patient fell yesterday and hit his head.  No loss of conscious patient has a history of hypertension atrial fibrillation on Xarelto.  Patient complains of mild headache  The history is provided by the patient and medical records. No language interpreter was used.  Weakness Severity:  Mild Onset quality:  Sudden Timing:  Intermittent Progression:  Waxing and waning Chronicity:  New Context: not alcohol use   Relieved by:  Nothing Worsened by:  Nothing Ineffective treatments:  None tried Associated symptoms: no abdominal pain, no chest pain, no cough, no diarrhea, no frequency, no headaches and no seizures       Home Medications Prior to Admission medications   Medication Sig Start Date End Date Taking? Authorizing Provider  abiraterone acetate (ZYTIGA) 250 MG tablet Take 2 tablets (500 mg total) by mouth daily. Take on an empty stomach 1 hour before or 2 hours after a meal 01/15/21   Derek Jack, MD  amLODipine (NORVASC) 10 MG tablet Take 10 mg by mouth daily.    [provider]  atorvastatin (LIPITOR) 20 MG tablet Take 1 tablet (20 mg total) by mouth daily at 6 PM. Patient taking differently: Take 20 mg by mouth at bedtime. 07/23/12   Lyda Jester M, PA-C  carvedilol (COREG) 25 MG tablet Take 50 mg by mouth 2 (two) times daily with a meal.    [provider]  Cholecalciferol 25 MCG (1000 UT) tablet Take 1,000 Units by mouth daily.    [provider]  diclofenac sodium (VOLTAREN) 1 % GEL Apply 2 g topically 4 (four) times daily. Patient taking differently: Apply 2 g topically 4 (four) times daily as needed (pain). 06/19/17   Rancour, Annie Main, MD  Ensure (ENSURE) Take 237 mLs by mouth daily as needed (nutrition).    [provider]  ferrous sulfate 324 MG TBEC Take 324 mg by mouth 2 (two) times daily.    [provider]  fluticasone (FLONASE) 50 MCG/ACT nasal spray Place 2 sprays into both nostrils daily. Patient not taking: Reported on 02/03/2021 02/15/16   Ward, Delice Bison, DO  HYDROcodone-acetaminophen (NORCO/VICODIN) 5-325 MG tablet Take 1 tablet by mouth every 6 (six) hours as needed for moderate pain. 05/03/20   Milton Ferguson, MD  megestrol (MEGACE) 400 MG/10ML suspension Take 10 mLs (400 mg total) by mouth 2 (two) times daily. 01/15/21   Derek Jack, MD  ondansetron (ZOFRAN-ODT) 8 MG disintegrating tablet Take 1 tablet (8 mg total) by mouth every 8 (eight) hours as needed for nausea or vomiting. 02/05/21   Barton Dubois, MD  pantoprazole (PROTONIX) 40 MG tablet Take 1 tablet (40 mg total) by mouth 2 (two) times daily. 02/05/21 02/05/22  Barton Dubois, MD  potassium chloride SA (KLOR-CON) 20 MEQ tablet Take 20 mEq by mouth daily. 02/28/20   [provider]  predniSONE (DELTASONE) 5 MG tablet Take 1 tablet (5 mg total) by mouth daily with breakfast. 01/15/21   Derek Jack, MD  rivaroxaban (XARELTO) 20 MG TABS tablet Take 1 tablet (20 mg total) by mouth daily with supper. 10/30/13   Croitoru, Mihai, MD  silodosin (RAPAFLO) 8 MG CAPS capsule Take 1 capsule (8 mg total) by mouth at bedtime. 01/21/21   Festus Aloe, MD  Allergies    Patient has no known allergies.    Review of Systems   Review of Systems  Constitutional:  Negative for appetite change and fatigue.  HENT:  Negative for congestion, ear discharge and sinus pressure.        Headache  Eyes:  Negative for discharge.  Respiratory:  Negative for cough.   Cardiovascular:  Negative for chest pain.  Gastrointestinal:  Negative for abdominal pain and diarrhea.  Genitourinary:  Negative for frequency and hematuria.  Musculoskeletal:  Negative for back pain.  Skin:  Negative for rash.  Neurological:   Positive for weakness. Negative for seizures and headaches.  Psychiatric/Behavioral:  Negative for hallucinations.    Physical Exam Updated Vital Signs BP (!) 147/74    Pulse 61    Temp 97.7 F (36.5 C)    Resp (!) 24    Ht 6\' 1"  (1.854 m)    Wt 91.6 kg    SpO2 100%    BMI 26.65 kg/m  Physical Exam Vitals and nursing note reviewed.  Constitutional:      Appearance: He is well-developed.  HENT:     Head: Normocephalic.     Nose: Nose normal.  Eyes:     General: No scleral icterus.    Conjunctiva/sclera: Conjunctivae normal.  Neck:     Thyroid: No thyromegaly.  Cardiovascular:     Rate and Rhythm: Normal rate and regular rhythm.     Heart sounds: No murmur heard.   No friction rub. No gallop.  Pulmonary:     Breath sounds: No stridor. No wheezing or rales.  Chest:     Chest wall: No tenderness.  Abdominal:     General: There is no distension.     Tenderness: There is no abdominal tenderness. There is no rebound.  Musculoskeletal:        General: Normal range of motion.     Cervical back: Neck supple.  Lymphadenopathy:     Cervical: No cervical adenopathy.  Skin:    Findings: No erythema or rash.  Neurological:     Mental Status: He is alert and oriented to person, place, and time.     Motor: No abnormal muscle tone.     Coordination: Coordination normal.  Psychiatric:        Behavior: Behavior normal.    ED Results / Procedures / Treatments   Labs (all labs ordered are listed, but only abnormal results are displayed) Labs Reviewed - No data to display  EKG None  Radiology CT Head Wo Contrast  Result Date: 02/16/2021 CLINICAL DATA:  Head injury after fall. EXAM: CT HEAD WITHOUT CONTRAST TECHNIQUE: Contiguous axial images were obtained from the base of the skull through the vertex without intravenous contrast. COMPARISON:  Jun 19, 2017. FINDINGS: Brain: Mild chronic ischemic white matter disease is noted. No mass effect or midline shift is noted. Ventricular size is  within normal limits. There is no evidence of mass lesion, hemorrhage or acute infarction. Vascular: No hyperdense vessel or unexpected calcification. Skull: Normal. Negative for fracture or focal lesion. Sinuses/Orbits: No acute finding. Other: None. IMPRESSION: No acute intracranial abnormality seen. Electronically Signed   By: Marijo Conception M.D.   On: 02/16/2021 12:31    Procedures Procedures    Medications Ordered in ED Medications - No data to display  ED Course/ Medical Decision Making/ A&P  Medical Decision Making  Patient with a fall leading his head no loss of consciousness.  CT scan negative.  He will follow-up as needed This patient presents to the ED for concern of head injury, this involves an extensive number of treatment options, and is a complaint that carries with it a high risk of complications and morbidity.  The differential diagnosis includes subdural hematoma subarachnoid hematoma contusion to head   Co morbidities that complicate the patient evaluation  Patient is atrial fibs with Xarelto   Additional history obtained:  Additional history obtained from nursing home records External records from outside source obtained and reviewed including nursing home record   Lab Tests:  I Ordered, and personally interpreted labs.  The pertinent results include: No labs ordered   Imaging Studies ordered:  I ordered imaging studies including CT head I independently visualized and interpreted imaging which showed no acute bleed or injury I agree with the radiologist interpretation   Cardiac Monitoring:  The patient was maintained on a cardiac monitor.  I personally viewed and interpreted the cardiac monitored which showed an underlying rhythm of: Atrial fibs   Medicines ordered and prescription drug management:   Reevaluation of the patient after these medicines showed that the patient stayed the same I have reviewed the patients home  medicines and have made adjustments as needed   Test Considered:  None   Critical Interventions:  None   Consultations Obtained:    Problem List / ED Course:  Fall with head injury, no bleed or fractures until  Reevaluation:  After the interventions noted above, I reevaluated the patient and found that they have :stayed the same   Social Determinants of Health:  Patient is back to his home   Dispostion:  After consideration of the diagnostic results and the patients response to treatment, I feel that the patent would benefit from no additional treatment        Final Clinical Impression(s) / ED Diagnoses Final diagnoses:  Fall, initial encounter    Rx / DC Orders ED Discharge Orders     None         Milton Ferguson, MD 02/18/21 [phone]

## 2021-02-16 NOTE — ED Triage Notes (Addendum)
Pt bib ems from home for generalized weakness.  Pt was reported to have fallen yesterday and hit head.  Pt has mild swelling to right eyebrow area.  Pt is alert and oriented x 4.  Resp even and unlabored.  Bgl 134 by ems.  Negative stroke screening by ems.  Ambulatory to bed from ems stretcher with assistance.  No facial droop or lateral weakness.

## 2021-02-16 NOTE — Discharge Instructions (Signed)
Follow up with your md if needed °

## 2021-02-17 ENCOUNTER — Other Ambulatory Visit (HOSPITAL_COMMUNITY): Payer: Self-pay | Admitting: Hematology

## 2021-02-19 ENCOUNTER — Encounter (HOSPITAL_COMMUNITY): Payer: Self-pay | Admitting: Emergency Medicine

## 2021-02-19 ENCOUNTER — Other Ambulatory Visit: Payer: Self-pay

## 2021-02-19 ENCOUNTER — Emergency Department (HOSPITAL_COMMUNITY): Payer: No Typology Code available for payment source

## 2021-02-19 ENCOUNTER — Inpatient Hospital Stay (HOSPITAL_COMMUNITY)
Admission: EM | Admit: 2021-02-19 | Discharge: 2021-02-22 | DRG: 641 | Disposition: A | Discharge status: Home / Self Care | Payer: No Typology Code available for payment source | Attending: Internal Medicine | Admitting: Internal Medicine

## 2021-02-19 DIAGNOSIS — Z8679 Personal history of other diseases of the circulatory system: Secondary | ICD-10-CM

## 2021-02-19 DIAGNOSIS — I10 Essential (primary) hypertension: Secondary | ICD-10-CM | POA: Diagnosis present

## 2021-02-19 DIAGNOSIS — G8929 Other chronic pain: Secondary | ICD-10-CM | POA: Diagnosis present

## 2021-02-19 DIAGNOSIS — Z85038 Personal history of other malignant neoplasm of large intestine: Secondary | ICD-10-CM

## 2021-02-19 DIAGNOSIS — I480 Paroxysmal atrial fibrillation: Secondary | ICD-10-CM | POA: Diagnosis present

## 2021-02-19 DIAGNOSIS — C7901 Secondary malignant neoplasm of right kidney and renal pelvis: Secondary | ICD-10-CM | POA: Diagnosis present

## 2021-02-19 DIAGNOSIS — Z79899 Other long term (current) drug therapy: Secondary | ICD-10-CM

## 2021-02-19 DIAGNOSIS — E871 Hypo-osmolality and hyponatremia: Secondary | ICD-10-CM | POA: Diagnosis present

## 2021-02-19 DIAGNOSIS — C7902 Secondary malignant neoplasm of left kidney and renal pelvis: Secondary | ICD-10-CM | POA: Diagnosis present

## 2021-02-19 DIAGNOSIS — R55 Syncope and collapse: Secondary | ICD-10-CM | POA: Diagnosis not present

## 2021-02-19 DIAGNOSIS — Z87891 Personal history of nicotine dependence: Secondary | ICD-10-CM

## 2021-02-19 DIAGNOSIS — Z950 Presence of cardiac pacemaker: Secondary | ICD-10-CM

## 2021-02-19 DIAGNOSIS — N179 Acute kidney failure, unspecified: Secondary | ICD-10-CM | POA: Diagnosis present

## 2021-02-19 DIAGNOSIS — E860 Dehydration: Secondary | ICD-10-CM | POA: Diagnosis not present

## 2021-02-19 DIAGNOSIS — E872 Acidosis, unspecified: Secondary | ICD-10-CM | POA: Diagnosis present

## 2021-02-19 DIAGNOSIS — E785 Hyperlipidemia, unspecified: Secondary | ICD-10-CM | POA: Diagnosis present

## 2021-02-19 DIAGNOSIS — Z923 Personal history of irradiation: Secondary | ICD-10-CM

## 2021-02-19 DIAGNOSIS — C61 Malignant neoplasm of prostate: Secondary | ICD-10-CM | POA: Diagnosis present

## 2021-02-19 DIAGNOSIS — M109 Gout, unspecified: Secondary | ICD-10-CM | POA: Diagnosis present

## 2021-02-19 DIAGNOSIS — Z20822 Contact with and (suspected) exposure to covid-19: Secondary | ICD-10-CM | POA: Diagnosis present

## 2021-02-19 DIAGNOSIS — C641 Malignant neoplasm of right kidney, except renal pelvis: Secondary | ICD-10-CM | POA: Diagnosis present

## 2021-02-19 DIAGNOSIS — M549 Dorsalgia, unspecified: Secondary | ICD-10-CM | POA: Diagnosis present

## 2021-02-19 DIAGNOSIS — D649 Anemia, unspecified: Secondary | ICD-10-CM | POA: Diagnosis present

## 2021-02-19 DIAGNOSIS — Z9049 Acquired absence of other specified parts of digestive tract: Secondary | ICD-10-CM

## 2021-02-19 DIAGNOSIS — Z8546 Personal history of malignant neoplasm of prostate: Secondary | ICD-10-CM

## 2021-02-19 DIAGNOSIS — Z7901 Long term (current) use of anticoagulants: Secondary | ICD-10-CM

## 2021-02-19 DIAGNOSIS — Z7952 Long term (current) use of systemic steroids: Secondary | ICD-10-CM

## 2021-02-19 DIAGNOSIS — N2889 Other specified disorders of kidney and ureter: Secondary | ICD-10-CM | POA: Diagnosis present

## 2021-02-19 LAB — CBC WITH DIFFERENTIAL/PLATELET
Abs Immature Granulocytes: 0.06 10*3/uL (ref 0.00–0.07)
Basophils Absolute: 0 10*3/uL (ref 0.0–0.1)
Basophils Relative: 0 %
Eosinophils Absolute: 0 10*3/uL (ref 0.0–0.5)
Eosinophils Relative: 1 %
HCT: 37.5 % — ABNORMAL LOW (ref 39.0–52.0)
Hemoglobin: 13.2 g/dL (ref 13.0–17.0)
Immature Granulocytes: 1 %
Lymphocytes Relative: 19 %
Lymphs Abs: 1.3 10*3/uL (ref 0.7–4.0)
MCH: 34.2 pg — ABNORMAL HIGH (ref 26.0–34.0)
MCHC: 35.2 g/dL (ref 30.0–36.0)
MCV: 97.2 fL (ref 80.0–100.0)
Monocytes Absolute: 0.7 10*3/uL (ref 0.1–1.0)
Monocytes Relative: 10 %
Neutro Abs: 4.6 10*3/uL (ref 1.7–7.7)
Neutrophils Relative %: 69 %
Platelets: 326 10*3/uL (ref 150–400)
RBC: 3.86 MIL/uL — ABNORMAL LOW (ref 4.22–5.81)
RDW: 13.2 % (ref 11.5–15.5)
WBC: 6.6 10*3/uL (ref 4.0–10.5)
nRBC: 0 % (ref 0.0–0.2)

## 2021-02-19 LAB — I-STAT CHEM 8, ED
BUN: 52 mg/dL — ABNORMAL HIGH (ref 8–23)
Calcium, Ion: 1.18 mmol/L (ref 1.15–1.40)
Chloride: 108 mmol/L (ref 98–111)
Creatinine, Ser: 2.3 mg/dL — ABNORMAL HIGH (ref 0.61–1.24)
Glucose, Bld: 112 mg/dL — ABNORMAL HIGH (ref 70–99)
HCT: 38 % — ABNORMAL LOW (ref 39.0–52.0)
Hemoglobin: 12.9 g/dL — ABNORMAL LOW (ref 13.0–17.0)
Potassium: 4.8 mmol/L (ref 3.5–5.1)
Sodium: 134 mmol/L — ABNORMAL LOW (ref 135–145)
TCO2: 18 mmol/L — ABNORMAL LOW (ref 22–32)

## 2021-02-19 LAB — TROPONIN I (HIGH SENSITIVITY)
Troponin I (High Sensitivity): 57 ng/L — ABNORMAL HIGH (ref <18)
Troponin I (High Sensitivity): 53 ng/L — ABNORMAL HIGH (ref <18)

## 2021-02-19 LAB — COMPREHENSIVE METABOLIC PANEL
ALT: 19 U/L (ref 0–44)
AST: 17 U/L (ref 15–41)
Albumin: 3.1 g/dL — ABNORMAL LOW (ref 3.5–5.0)
Alkaline Phosphatase: 58 U/L (ref 38–126)
Anion gap: 9 (ref 5–15)
BUN: 60 mg/dL — ABNORMAL HIGH (ref 8–23)
CO2: 16 mmol/L — ABNORMAL LOW (ref 22–32)
Calcium: 9.3 mg/dL (ref 8.9–10.3)
Chloride: 106 mmol/L (ref 98–111)
Creatinine, Ser: 2.25 mg/dL — ABNORMAL HIGH (ref 0.61–1.24)
GFR, Estimated: 27 mL/min — ABNORMAL LOW (ref >60)
Glucose, Bld: 111 mg/dL — ABNORMAL HIGH (ref 70–99)
Potassium: 4.6 mmol/L (ref 3.5–5.1)
Sodium: 131 mmol/L — ABNORMAL LOW (ref 135–145)
Total Bilirubin: 0.4 mg/dL (ref 0.3–1.2)
Total Protein: 6.9 g/dL (ref 6.5–8.1)

## 2021-02-19 LAB — URINALYSIS, ROUTINE W REFLEX MICROSCOPIC
Bilirubin Urine: NEGATIVE
Glucose, UA: NEGATIVE mg/dL
Hgb urine dipstick: NEGATIVE
Ketones, ur: NEGATIVE mg/dL
Leukocytes,Ua: NEGATIVE
Nitrite: NEGATIVE
Protein, ur: NEGATIVE mg/dL
Specific Gravity, Urine: 1.012 (ref 1.005–1.030)
pH: 5 (ref 5.0–8.0)

## 2021-02-19 LAB — RESP PANEL BY RT-PCR (FLU A&B, COVID) ARPGX2
Influenza A by PCR: NEGATIVE
Influenza B by PCR: NEGATIVE
SARS Coronavirus 2 by RT PCR: NEGATIVE

## 2021-02-19 LAB — LIPASE, BLOOD: Lipase: 52 U/L — ABNORMAL HIGH (ref 11–51)

## 2021-02-19 MED ORDER — SODIUM CHLORIDE 0.9 % IV BOLUS
500.0000 mL | Freq: Once | INTRAVENOUS | Status: AC
  Administered 2021-02-19: 500 mL via INTRAVENOUS

## 2021-02-19 NOTE — ED Provider Notes (Addendum)
[facility] EMERGENCY DEPARTMENT Provider Note   CSN: [id number] Arrival date & time: 02/19/21  2008     History  Chief Complaint  Patient presents with   Generalized Weakness    Derrick Long is a 88 y.o. male.  The history is provided by the patient and the spouse.  Weakness Severity:  Moderate Onset quality:  Gradual Duration:  3 days Timing:  Intermittent Progression:  Waxing and waning Chronicity:  Derrick Relieved by:  Nothing Worsened by:  Nothing Associated symptoms: falls, nausea and near-syncope   Associated symptoms: no abdominal pain, no anorexia, no aphasia, no arthralgias, no ataxia, no chest pain, no cough, no diarrhea, no dysuria, no numbness in extremities, no fever, no headaches, no hematochezia, no loss of consciousness, no melena, no seizures, no shortness of breath, no stroke symptoms and no vomiting   Risk factors comment:  HTN, prostate cancer on Derrick medicine zytiga     Home Medications Prior to Admission medications   Medication Sig Start Date End Date Taking? Authorizing Provider  abiraterone acetate (ZYTIGA) 250 MG tablet Take 2 tablets (500 mg total) by mouth daily. Take on an empty stomach 1 hour before or 2 hours after a meal 01/15/21   Derek Jack, MD  amLODipine (NORVASC) 10 MG tablet Take 10 mg by mouth daily.    [provider]  atorvastatin (LIPITOR) 20 MG tablet Take 1 tablet (20 mg total) by mouth daily at 6 PM. Patient taking differently: Take 20 mg by mouth at bedtime. 07/23/12   Lyda Jester M, PA-C  carvedilol (COREG) 25 MG tablet Take 50 mg by mouth 2 (two) times daily with a meal.    [provider]  Cholecalciferol 25 MCG (1000 UT) tablet Take 1,000 Units by mouth daily.    [provider]  diclofenac sodium (VOLTAREN) 1 % GEL Apply 2 g topically 4 (four) times daily. Patient taking differently: Apply 2 g topically 4 (four) times daily as needed (pain). 06/19/17   Rancour,  Annie Main, MD  Ensure (ENSURE) Take 237 mLs by mouth daily as needed (nutrition).    [provider]  ferrous sulfate 324 MG TBEC Take 324 mg by mouth 2 (two) times daily.    [provider]  fluticasone (FLONASE) 50 MCG/ACT nasal spray Place 2 sprays into both nostrils daily. Patient not taking: Reported on 02/03/2021 02/15/16   Ward, Delice Bison, DO  HYDROcodone-acetaminophen (NORCO/VICODIN) 5-325 MG tablet Take 1 tablet by mouth every 6 (six) hours as needed for moderate pain. 05/03/20   Milton Ferguson, MD  megestrol (MEGACE) 400 MG/10ML suspension Take 10 mLs (400 mg total) by mouth 2 (two) times daily. 01/15/21   Derek Jack, MD  ondansetron (ZOFRAN-ODT) 8 MG disintegrating tablet Take 1 tablet (8 mg total) by mouth every 8 (eight) hours as needed for nausea or vomiting. 02/05/21   Barton Dubois, MD  pantoprazole (PROTONIX) 40 MG tablet Take 1 tablet (40 mg total) by mouth 2 (two) times daily. 02/05/21 02/05/22  Barton Dubois, MD  potassium chloride SA (KLOR-CON) 20 MEQ tablet Take 20 mEq by mouth daily. 02/28/20   [provider]  predniSONE (DELTASONE) 5 MG tablet Take 1 tablet (5 mg total) by mouth daily with breakfast. 01/15/21   Derek Jack, MD  rivaroxaban (XARELTO) 20 MG TABS tablet Take 1 tablet (20 mg total) by mouth daily with supper. 10/30/13   Croitoru, Mihai, MD  silodosin (RAPAFLO) 8 MG CAPS capsule Take 1 capsule (8 mg total)  by mouth at bedtime. 01/21/21   Festus Aloe, MD      Allergies    Patient has no known allergies.    Review of Systems   Review of Systems  Constitutional:  Positive for fatigue. Negative for chills and fever.  HENT:  Negative for ear pain and sore throat.   Eyes:  Negative for pain and visual disturbance.  Respiratory:  Negative for cough and shortness of breath.   Cardiovascular:  Positive for near-syncope. Negative for chest pain and palpitations.  Gastrointestinal:  Positive for nausea. Negative for  abdominal pain, anorexia, diarrhea, hematochezia, melena and vomiting.  Genitourinary:  Negative for dysuria and hematuria.  Musculoskeletal:  Positive for falls. Negative for arthralgias and back pain.  Skin:  Negative for color change and rash.  Neurological:  Positive for weakness and light-headedness. Negative for seizures, loss of consciousness, syncope and headaches.  All other systems reviewed and are negative.  Physical Exam Updated Vital Signs  ED Triage Vitals  Enc Vitals Group     BP 02/19/21 2013 (!) 151/74     Pulse Rate 02/19/21 2013 66     Resp 02/19/21 2013 19     Temp 02/19/21 2013 97.6 F (36.4 C)     Temp Source 02/19/21 2013 Temporal     SpO2 02/19/21 2013 99 %     Weight --      Height --      Head Circumference --      Peak Flow --      Pain Score 02/19/21 2010 0     Pain Loc --      Pain Edu? --      Excl. in [place]? --     Physical Exam Vitals and nursing note reviewed.  Constitutional:      General: He is not in acute distress.    Appearance: He is well-developed. He is not ill-appearing.  HENT:     Head: Normocephalic and atraumatic.     Nose: Nose normal.     Mouth/Throat:     Mouth: Mucous membranes are dry.  Eyes:     Extraocular Movements: Extraocular movements intact.     Conjunctiva/sclera: Conjunctivae normal.     Pupils: Pupils are equal, round, and reactive to light.  Cardiovascular:     Rate and Rhythm: Normal rate. Rhythm irregular.     Pulses: Normal pulses.     Heart sounds: Normal heart sounds. No murmur heard. Pulmonary:     Effort: Pulmonary effort is normal. No respiratory distress.     Breath sounds: Normal breath sounds.  Abdominal:     Palpations: Abdomen is soft.     Tenderness: There is no abdominal tenderness.  Musculoskeletal:        General: No swelling or tenderness. Normal range of motion.     Cervical back: Normal range of motion and neck supple.  Skin:    General: Skin is warm and dry.     Capillary Refill:  Capillary refill takes less than 2 seconds.  Neurological:     General: No focal deficit present.     Mental Status: He is alert and oriented to person, place, and time.     Cranial Nerves: No cranial nerve deficit.     Sensory: No sensory deficit.     Motor: No weakness.     Comments: 5+ out of 5 strength, normal speech, no visual field deficits, when he stands up he feels too weak and he sits back  down and unable to test gait  Psychiatric:        Mood and Affect: Mood normal.    ED Results / Procedures / Treatments   Labs (all labs ordered are listed, but only abnormal results are displayed) Labs Reviewed  CBC WITH DIFFERENTIAL/PLATELET - Abnormal; Notable for the following components:      Result Value   RBC 3.86 (*)    HCT 37.5 (*)    MCH 34.2 (*)    All other components within normal limits  COMPREHENSIVE METABOLIC PANEL - Abnormal; Notable for the following components:   Sodium 131 (*)    CO2 16 (*)    Glucose, Bld 111 (*)    BUN 60 (*)    Creatinine, Ser 2.25 (*)    Albumin 3.1 (*)    GFR, Estimated 27 (*)    All other components within normal limits  LIPASE, BLOOD - Abnormal; Notable for the following components:   Lipase 52 (*)    All other components within normal limits  I-STAT CHEM 8, ED - Abnormal; Notable for the following components:   Sodium 134 (*)    BUN 52 (*)    Creatinine, Ser 2.30 (*)    Glucose, Bld 112 (*)    TCO2 18 (*)    Hemoglobin 12.9 (*)    HCT 38.0 (*)    All other components within normal limits  TROPONIN I (HIGH SENSITIVITY) - Abnormal; Notable for the following components:   Troponin I (High Sensitivity) 57 (*)    All other components within normal limits  RESP PANEL BY RT-PCR (FLU A&B, COVID) ARPGX2  URINALYSIS, ROUTINE W REFLEX MICROSCOPIC  TROPONIN I (HIGH SENSITIVITY)    EKG None  Radiology CT HEAD WO CONTRAST  Result Date: 02/19/2021 CLINICAL DATA:  Trauma, status post fall. EXAM: CT HEAD WITHOUT CONTRAST TECHNIQUE:  Contiguous axial images were obtained from the base of the skull through the vertex without intravenous contrast. COMPARISON:  Head CT 3 days ago 02/16/2021 FINDINGS: Brain: Unchanged atrophy and chronic small vessel ischemia. No intracranial hemorrhage, mass effect, or midline shift. No hydrocephalus. The basilar cisterns are patent. Tiny left inferior frontal parenchymal calcification is unchanged. No evidence of territorial infarct or acute ischemia. No extra-axial or intracranial fluid collection. Vascular: Atherosclerosis of skullbase vasculature without hyperdense vessel or abnormal calcification. Skull: No fracture or focal lesion. Sinuses/Orbits: No acute findings. Scattered mucous retention cysts in the maxillary sinuses. Bilateral cataract resection. Other: None. IMPRESSION: 1. No acute intracranial abnormality. No skull fracture. 2. Unchanged atrophy and chronic small vessel ischemia. Electronically Signed   By: Keith Rake M.D.   On: 02/19/2021 21:53   CT CERVICAL SPINE WO CONTRAST  Result Date: 02/19/2021 CLINICAL DATA:  Neck trauma.  Fall. EXAM: CT CERVICAL SPINE WITHOUT CONTRAST TECHNIQUE: Multidetector CT imaging of the cervical spine was performed without intravenous contrast. Multiplanar CT image reconstructions were also generated. COMPARISON:  None. FINDINGS: Alignment: No traumatic subluxation. 3-4 mm anterolisthesis of C7 on T1, 2-3 mm anterolisthesis of T1 on T2, both likely facet mediated. Skull base and vertebrae: No acute fracture. Advanced degenerative change at C1-C2, osteophytes simulating fracture. Moderate pannus. The dens and skull base are intact. Soft tissues and spinal canal: No prevertebral fluid or swelling. No visible canal hematoma. Disc levels: Diffuse degenerative disc disease with disc space narrowing and endplate spurring at all levels, most prominently affecting C5-C6 and C6-C7. There is prominent multilevel facet hypertrophy. Upper chest: No acute or unexpected  findings. Other:  Carotid calcifications. IMPRESSION: 1. No acute fracture or subluxation of the cervical spine. 2. Advanced multilevel degenerative disc disease and facet hypertrophy. Electronically Signed   By: Keith Rake M.D.   On: 02/19/2021 21:56   DG Chest Port 1 View  Result Date: 02/19/2021 CLINICAL DATA:  near syncope EXAM: PORTABLE CHEST 1 VIEW.  Patient is rotated. COMPARISON:  Chest x-ray 09/16/2019.  CT angio chest 02/03/2021 FINDINGS: Left chest wall 2 lead pacemaker in similar position. The heart and mediastinal contours are unchanged given rotation. Aortic calcification. Biapical pleural/pulmonary scarring. No focal consolidation. No pulmonary edema. No pleural effusion. No pneumothorax. No acute osseous abnormality. IMPRESSION: No active disease. Electronically Signed   By: Iven Finn M.D.   On: 02/19/2021 21:23    Procedures Procedures    Medications Ordered in ED Medications  sodium chloride 0.9 % bolus 500 mL (0 mLs Intravenous Stopped 02/19/21 2146)  sodium chloride 0.9 % bolus 500 mL (0 mLs Intravenous Stopped 02/19/21 2219)    ED Course/ Medical Decision Making/ A&P                           Medical Decision Making  Derrick Long is a 88 year old male with history of hypertension, prostate cancer on hormone therapy recently started on a Derrick medicine called Zytiga, paroxysmal atrial fibrillation status post pacemaker who presents to the ED after multiple near syncopal events.  He has been feeling weak over the last 3 to 4 days.  He had a fall 3 days ago and went to [facility] and had a head CT that was unremarkable.  He is on blood thinner Xarelto.  Does not appear that lab work was initiated at that time.  He did have a recent hospital admission last month upon chart review for abdominal pain.  He had an EGD and was started on reflux medicine.  Overall he had unremarkable work-up.  There is no evidence of GI bleed.  He denies any melena or hematochezia.   His orthostatics in the room do show a BP of 150 sys while lying and a 130 while sitting up, to weak to stand up long time.  Neurologic exam is normal.  He describes weakness with position changes and ambulation.  He feels like he is in a pass out at times when he is walking.  He has not had any full loss of consciousness but has had frequent feeling of near syncope.  Denies any chest pain or shortness of breath or infectious symptoms.    Overall will include lab work and imaging of his head, neck, chest for evaluation today.    Differential includes cardiac arrhythmia and will have his pacemaker interrogated he states that he is due for battery change, EKG ordered and interpreted by me shows sinus rhythm.  No obvious Derrick ischemic changes.  Fairly unchanged from prior.  He did appear to have an episode of PVCs/wide rhythm tachycardia while in the room with me but short lasting.  Will evaluate for anemia, head injury, other electrolyte abnormality/infectious process such as pneumonia, COVID or UTI.  Will give IV fluid bolus and reevaluate when lab work is obtained.  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  He has also recently started a Derrick medicine for his prostate cancer called Zytiga and possible that some sort of side effect from the medicine as well.  I have no suspicion for stroke at this time.  History was obtained by patient and family member.  Previous medical charts have been reviewed including medications.  10:24 PM talked on the phone with the [employer] rep for his pacemaker and overall no concerning arrhythmic events.  Does have a low battery notice but is still functioning.  Patient with lab work that was ordered and upon my interpretation it does appear that he has acute kidney injury.  Kidney function is 2.3, bicarb is 16, BUN is elevated.  Troponin is at his baseline.  Hemoglobin 13.  No leukocytosis.  Overall possibly dehydration as clinically he looks dehydrated.  He was  mildly orthostatic.  He denies any decreased p.o. intake or GI losses such as diarrhea.  CT scan of his head and neck which were ordered by me and interpreted and reviewed by me showed no acute findings.  Chest x-ray also reviewed and interpreted by myself shows no acute process.  Overall given his multiple near syncopal symptoms especially with activity believe he would benefit from admission for IV fluids and evaluation of his medications.  Furthermore, after discussion with Dr. Alcario Drought with hospitalist service who will be admitting we will add CT renal study to evaluate for any issues in the prostate/ureters/bladder area given his history of prostate cancer.  He has been able to urinate without any issues.  Suspect AKI could be from obstructive process from renal system.  CT has been ordered and urinalysis is pending.  Hospitalist will admit.   This chart was dictated using voice recognition software.  Despite best efforts to proofread,  errors can occur which can change the documentation meaning.    Final Clinical Impression(s) / ED Diagnoses Final diagnoses:  Near syncope  AKI (acute kidney injury) [facility])    Rx / [place] Orders ED Discharge Orders     None         Lennice Sites, DO 02/19/21 2227    Lennice Sites, DO 02/19/21 2243

## 2021-02-19 NOTE — ED Triage Notes (Signed)
Patient arrived with EMS from home reports generalized weakness this week with lightheaded/dizziness , family reported that patient's pacemaker/defibrillator  is due for replacement this month . Denies chest pain/ respirations unlabored.

## 2021-02-19 NOTE — ED Notes (Signed)
Secretary notified [employer] pacemaker technician to interrogate pacemaker.

## 2021-02-19 NOTE — ED Notes (Signed)
Patient transported to CT scan . 

## 2021-02-20 ENCOUNTER — Encounter (HOSPITAL_COMMUNITY): Payer: Self-pay | Admitting: Internal Medicine

## 2021-02-20 ENCOUNTER — Telehealth (HOSPITAL_COMMUNITY): Payer: Self-pay | Admitting: Pharmacy Technician

## 2021-02-20 DIAGNOSIS — R55 Syncope and collapse: Secondary | ICD-10-CM

## 2021-02-20 DIAGNOSIS — Z8546 Personal history of malignant neoplasm of prostate: Secondary | ICD-10-CM | POA: Diagnosis not present

## 2021-02-20 DIAGNOSIS — N179 Acute kidney failure, unspecified: Secondary | ICD-10-CM

## 2021-02-20 DIAGNOSIS — D649 Anemia, unspecified: Secondary | ICD-10-CM | POA: Diagnosis present

## 2021-02-20 DIAGNOSIS — Z923 Personal history of irradiation: Secondary | ICD-10-CM | POA: Diagnosis not present

## 2021-02-20 DIAGNOSIS — E872 Acidosis, unspecified: Secondary | ICD-10-CM | POA: Diagnosis present

## 2021-02-20 DIAGNOSIS — C7901 Secondary malignant neoplasm of right kidney and renal pelvis: Secondary | ICD-10-CM | POA: Diagnosis present

## 2021-02-20 DIAGNOSIS — Z7952 Long term (current) use of systemic steroids: Secondary | ICD-10-CM | POA: Diagnosis not present

## 2021-02-20 DIAGNOSIS — C641 Malignant neoplasm of right kidney, except renal pelvis: Secondary | ICD-10-CM | POA: Diagnosis present

## 2021-02-20 DIAGNOSIS — E871 Hypo-osmolality and hyponatremia: Secondary | ICD-10-CM | POA: Diagnosis present

## 2021-02-20 DIAGNOSIS — C7902 Secondary malignant neoplasm of left kidney and renal pelvis: Secondary | ICD-10-CM | POA: Diagnosis present

## 2021-02-20 DIAGNOSIS — C61 Malignant neoplasm of prostate: Secondary | ICD-10-CM

## 2021-02-20 DIAGNOSIS — M549 Dorsalgia, unspecified: Secondary | ICD-10-CM | POA: Diagnosis present

## 2021-02-20 DIAGNOSIS — Z950 Presence of cardiac pacemaker: Secondary | ICD-10-CM | POA: Diagnosis not present

## 2021-02-20 DIAGNOSIS — Z87891 Personal history of nicotine dependence: Secondary | ICD-10-CM | POA: Diagnosis not present

## 2021-02-20 DIAGNOSIS — M109 Gout, unspecified: Secondary | ICD-10-CM | POA: Diagnosis present

## 2021-02-20 DIAGNOSIS — I480 Paroxysmal atrial fibrillation: Secondary | ICD-10-CM | POA: Diagnosis present

## 2021-02-20 DIAGNOSIS — G8929 Other chronic pain: Secondary | ICD-10-CM | POA: Diagnosis present

## 2021-02-20 DIAGNOSIS — Z20822 Contact with and (suspected) exposure to covid-19: Secondary | ICD-10-CM | POA: Diagnosis present

## 2021-02-20 DIAGNOSIS — E860 Dehydration: Secondary | ICD-10-CM | POA: Diagnosis present

## 2021-02-20 DIAGNOSIS — Z7901 Long term (current) use of anticoagulants: Secondary | ICD-10-CM | POA: Diagnosis not present

## 2021-02-20 DIAGNOSIS — N2889 Other specified disorders of kidney and ureter: Secondary | ICD-10-CM | POA: Diagnosis not present

## 2021-02-20 DIAGNOSIS — Z85038 Personal history of other malignant neoplasm of large intestine: Secondary | ICD-10-CM | POA: Diagnosis not present

## 2021-02-20 DIAGNOSIS — E785 Hyperlipidemia, unspecified: Secondary | ICD-10-CM | POA: Diagnosis present

## 2021-02-20 DIAGNOSIS — Z8679 Personal history of other diseases of the circulatory system: Secondary | ICD-10-CM

## 2021-02-20 DIAGNOSIS — Z9049 Acquired absence of other specified parts of digestive tract: Secondary | ICD-10-CM | POA: Diagnosis not present

## 2021-02-20 DIAGNOSIS — I10 Essential (primary) hypertension: Secondary | ICD-10-CM | POA: Diagnosis present

## 2021-02-20 DIAGNOSIS — Z79899 Other long term (current) drug therapy: Secondary | ICD-10-CM | POA: Diagnosis not present

## 2021-02-20 LAB — BASIC METABOLIC PANEL
Anion gap: 6 (ref 5–15)
BUN: 51 mg/dL — ABNORMAL HIGH (ref 8–23)
CO2: 15 mmol/L — ABNORMAL LOW (ref 22–32)
Calcium: 9 mg/dL (ref 8.9–10.3)
Chloride: 110 mmol/L (ref 98–111)
Creatinine, Ser: 1.98 mg/dL — ABNORMAL HIGH (ref 0.61–1.24)
GFR, Estimated: 31 mL/min — ABNORMAL LOW (ref >60)
Glucose, Bld: 172 mg/dL — ABNORMAL HIGH (ref 70–99)
Potassium: 3.9 mmol/L (ref 3.5–5.1)
Sodium: 131 mmol/L — ABNORMAL LOW (ref 135–145)

## 2021-02-20 LAB — CBC
HCT: 35.3 % — ABNORMAL LOW (ref 39.0–52.0)
Hemoglobin: 12 g/dL — ABNORMAL LOW (ref 13.0–17.0)
MCH: 33.9 pg (ref 26.0–34.0)
MCHC: 34 g/dL (ref 30.0–36.0)
MCV: 99.7 fL (ref 80.0–100.0)
Platelets: 281 10*3/uL (ref 150–400)
RBC: 3.54 MIL/uL — ABNORMAL LOW (ref 4.22–5.81)
RDW: 13.2 % (ref 11.5–15.5)
WBC: 6.2 10*3/uL (ref 4.0–10.5)
nRBC: 0 % (ref 0.0–0.2)

## 2021-02-20 MED ORDER — PREDNISONE 5 MG PO TABS
5.0000 mg | ORAL_TABLET | Freq: Every day | ORAL | Status: DC
  Administered 2021-02-20 – 2021-02-22 (×3): 5 mg via ORAL
  Filled 2021-02-20 (×4): qty 1

## 2021-02-20 MED ORDER — DICLOFENAC SODIUM 1 % EX GEL
2.0000 g | Freq: Four times a day (QID) | CUTANEOUS | Status: DC | PRN
  Filled 2021-02-20: qty 100

## 2021-02-20 MED ORDER — ABIRATERONE ACETATE 250 MG PO TABS
500.0000 mg | ORAL_TABLET | Freq: Every day | ORAL | Status: DC
Start: 2021-02-20 — End: 2021-02-21

## 2021-02-20 MED ORDER — RIVAROXABAN 15 MG PO TABS
15.0000 mg | ORAL_TABLET | Freq: Every day | ORAL | Status: DC
  Administered 2021-02-20 – 2021-02-21 (×2): 15 mg via ORAL
  Filled 2021-02-20 (×2): qty 1

## 2021-02-20 MED ORDER — ONDANSETRON HCL 4 MG/2ML IJ SOLN: 4.0000 mg | Freq: Four times a day (QID) | INTRAMUSCULAR | Status: DC | PRN

## 2021-02-20 MED ORDER — MEGESTROL ACETATE 400 MG/10ML PO SUSP
400.0000 mg | Freq: Two times a day (BID) | ORAL | Status: DC
  Administered 2021-02-20 – 2021-02-22 (×5): 400 mg via ORAL
  Filled 2021-02-20 (×7): qty 10

## 2021-02-20 MED ORDER — AMLODIPINE BESYLATE 10 MG PO TABS
10.0000 mg | ORAL_TABLET | Freq: Every day | ORAL | Status: DC
  Administered 2021-02-20 – 2021-02-22 (×3): 10 mg via ORAL
  Filled 2021-02-20: qty 1
  Filled 2021-02-20: qty 2
  Filled 2021-02-20: qty 1

## 2021-02-20 MED ORDER — HEPARIN SODIUM (PORCINE) 5000 UNIT/ML IJ SOLN: 5000.0000 [IU] | Freq: Three times a day (TID) | INTRAMUSCULAR | Status: DC

## 2021-02-20 MED ORDER — STERILE WATER FOR INJECTION IV SOLN
INTRAVENOUS | Status: DC
  Filled 2021-02-20 (×5): qty 1000

## 2021-02-20 MED ORDER — PANTOPRAZOLE SODIUM 40 MG PO TBEC
40.0000 mg | DELAYED_RELEASE_TABLET | Freq: Two times a day (BID) | ORAL | Status: DC
  Administered 2021-02-20 – 2021-02-22 (×5): 40 mg via ORAL
  Filled 2021-02-20 (×5): qty 1

## 2021-02-20 MED ORDER — ONDANSETRON HCL 4 MG PO TABS: 4.0000 mg | ORAL_TABLET | Freq: Four times a day (QID) | ORAL | Status: DC | PRN

## 2021-02-20 MED ORDER — FERROUS SULFATE 325 (65 FE) MG PO TABS
324.0000 mg | ORAL_TABLET | Freq: Two times a day (BID) | ORAL | Status: DC
  Administered 2021-02-20 – 2021-02-22 (×5): 324 mg via ORAL
  Filled 2021-02-20 (×5): qty 1

## 2021-02-20 MED ORDER — HYDROCODONE-ACETAMINOPHEN 5-325 MG PO TABS: 1.0000 | ORAL_TABLET | Freq: Four times a day (QID) | ORAL | Status: DC | PRN

## 2021-02-20 MED ORDER — VITAMIN D 25 MCG (1000 UNIT) PO TABS
1000.0000 [IU] | ORAL_TABLET | Freq: Every day | ORAL | Status: DC
  Administered 2021-02-20 – 2021-02-22 (×3): 1000 [IU] via ORAL
  Filled 2021-02-20 (×3): qty 1

## 2021-02-20 MED ORDER — RIVAROXABAN 10 MG PO TABS: 20.0000 mg | ORAL_TABLET | Freq: Every day | ORAL | Status: DC

## 2021-02-20 MED ORDER — ACETAMINOPHEN 325 MG PO TABS: 650.0000 mg | ORAL_TABLET | Freq: Four times a day (QID) | ORAL | Status: DC | PRN

## 2021-02-20 MED ORDER — ACETAMINOPHEN 650 MG RE SUPP: 650.0000 mg | Freq: Four times a day (QID) | RECTAL | Status: DC | PRN

## 2021-02-20 MED ORDER — DICLOFENAC SODIUM 1 % TD GEL: 2.0000 g | Freq: Four times a day (QID) | TRANSDERMAL | Status: DC | PRN

## 2021-02-20 MED ORDER — CARVEDILOL 25 MG PO TABS
50.0000 mg | ORAL_TABLET | Freq: Two times a day (BID) | ORAL | Status: DC
  Administered 2021-02-20 – 2021-02-22 (×5): 50 mg via ORAL
  Filled 2021-02-20: qty 4
  Filled 2021-02-20 (×2): qty 2
  Filled 2021-02-20: qty 4
  Filled 2021-02-20: qty 2

## 2021-02-20 MED ORDER — NAPHAZOLINE-GLYCERIN 0.012-0.25 % OP SOLN
2.0000 [drp] | Freq: Four times a day (QID) | OPHTHALMIC | Status: DC | PRN
  Filled 2021-02-20: qty 15

## 2021-02-20 MED ORDER — ATORVASTATIN CALCIUM 10 MG PO TABS
20.0000 mg | ORAL_TABLET | Freq: Every evening | ORAL | Status: DC
  Administered 2021-02-20 – 2021-02-21 (×2): 20 mg via ORAL
  Filled 2021-02-20 (×2): qty 2

## 2021-02-20 MED ORDER — TAMSULOSIN HCL 0.4 MG PO CAPS
0.4000 mg | ORAL_CAPSULE | Freq: Every day | ORAL | Status: DC
  Administered 2021-02-20 – 2021-02-21 (×2): 0.4 mg via ORAL
  Filled 2021-02-20 (×2): qty 1

## 2021-02-20 NOTE — Progress Notes (Signed)
Patient arrived to [place] room 11 alert. Pain level 0. Bed in lowest position. Call light in reach.

## 2021-02-20 NOTE — ED Notes (Signed)
Pt assisted to bathroom at this time via wheelchair

## 2021-02-20 NOTE — Progress Notes (Signed)
[facility] PROGRESS NOTE   Derrick Long KDX:[id number] DOB: [birth year] DOA: 02/19/2021  PCP: [facility]  Brief History/Interval Summary: 88 y.o. male with medical history significant of HTN, PAF, PPM, Prostate CA: s/p seed implant ~20 years ago, recurrent metastatic disease with adrenal met confirmed on biopsy as of March 2022.  Also has Renal masses though am unclear if these are metastatic prostate CA vs RCC.  Havent had these biopsied due to risk of bleed. Recently admitted 12/19-12/20/22 for N/V and abd pain.  Felt to be due to gastritis.  GI symptoms have resolved.  Patient presented with generalized weakness and near syncope.  Pacemaker was interrogated in the emergency department and no arrhythmias were recorded.  Battery was low but functioning.  Noted to have acute kidney injury on blood work.  Hospitalize for further management.  Reason for Visit: Ac Kidney injury  Consultants: None  Procedures: None yet    Subjective/Interval History: Patient mentions that he is feeling slightly better today though still feels quite weak and deconditioned.  His son is at the bedside.  His right eye is a little bit swollen and watery.  Denies any pain in the eye.  No vision disturbances.  No history of glaucoma.  Patient denies any chest pain shortness of breath nausea vomiting or diarrhea in the last 24 to 48 hours.     Assessment/Plan:  Acute kidney injury/metabolic acidosis/hyponatremia Baseline renal function normal with creatinine between 1.0-1.2.  Presented with creatinine of 2.3.  BUN was elevated at 52.  He was noted to have low bicarbonate level as well.  Started on fluids.  Slight improvement noted in creatinine. All of this is likely due to dehydration from recent GI illness along with poor oral intake the last several days.  Abdomen remains benign.  CT renal study did not show any hydronephrosis.  UA unremarkable. Monitor urine output.  Avoid nephrotoxic  agents.  Continue IV fluids.  Recheck labs tomorrow.  Paroxysmal atrial fibrillation Stable.  Continue Xarelto and carvedilol.  Pacemaker was interrogated in the emergency department and no arrhythmias were noted.  Battery was low but pacemaker is functioning normally.  Need to determine who his cardiologist is and where they are located.  Could be at the [place].  Generalized weakness/near syncope Likely due to dehydration.  No focal neurological deficits noted.  No arrhythmias noted on pacemaker interrogation.  PT and OT evaluation.  Essential hypertension Pressure is reasonably well controlled.  Continue amlodipine and carvedilol.  Metastatic prostate cancer Followed by medical oncology.  Noted to be on Zytiga Megace Flomax and prednisone chronically.  Bilateral renal masses Thought to be renal cell carcinoma versus metastatic lesion from prostate cancer.  Follow-up with medical oncology.    DVT Prophylaxis: Xarelto Code Status: Full code Family Communication: Discussed with patient and his son who was at the bedside Disposition Plan: Hopefully return home when improved  Status is: Inpatient  Remains inpatient appropriate because: Acute kidney injury, generalized weakness and deconditioning    Medications: Scheduled:  abiraterone acetate  500 mg Oral Daily   amLODipine  10 mg Oral Daily   atorvastatin  20 mg Oral QPM   carvedilol  50 mg Oral BID WC   cholecalciferol  1,000 Units Oral Daily   ferrous sulfate  324 mg Oral BID   megestrol  400 mg Oral BID   pantoprazole  40 mg Oral BID   predniSONE  5 mg Oral Q breakfast   rivaroxaban  15  mg Oral Q supper   tamsulosin  0.4 mg Oral QPC supper   Continuous:   sodium bicarbonate (isotonic) infusion in sterile water     OZD:[id number] **OR** acetaminophen, diclofenac Sodium, HYDROcodone-acetaminophen, ondansetron **OR** ondansetron (ZOFRAN) IV  Antibiotics: Anti-infectives (From admission, onward)    None        Objective:  Vital Signs  Vitals:   02/20/21 0400 02/20/21 0425 02/20/21 0500 02/20/21 0630  BP: (!) 148/51 (!) 123/56 129/77 123/77  Pulse: 64 67 68 70  Resp: _0 Temp:      TempSrc:      SpO2: 96% 95% 97% 98%    Intake/Output Summary (Last 24 hours) at 02/20/2021 6387 Last data filed at 02/20/2021 0520 Gross per 24 hour  Intake --  Output 375 ml  Net -375 ml   There were no vitals filed for this visit.  General appearance: Awake alert.  In no distress Resp: Clear to auscultation bilaterally.  Normal effort Cardio: S1-S2 is normal regular.  No S3-S4.  No rubs murmurs or bruit GI: Abdomen is soft.  Nontender nondistended.  Bowel sounds are present normal.  No masses organomegaly Extremities: No edema.  Full range of motion of lower extremities. Neurologic: Alert and oriented x3.  No focal neurological deficits.    Lab Results:  Data Reviewed: I have personally reviewed following labs and imaging studies  CBC: Recent Labs  Lab 02/19/21 2128 02/19/21 2131 02/20/21 0337  WBC  --  6.6 6.2  NEUTROABS  --  4.6  --   HGB 12.9* 13.2 12.0*  HCT 38.0* 37.5* 35.3*  MCV  --  97.2 99.7  PLT  --  326 564    Basic Metabolic Panel: Recent Labs  Lab 02/19/21 2128 02/19/21 2131 02/20/21 0337  NA 134* 131* 131*  K 4.8 4.6 3.9  CL 108 106 110  CO2  --  16* 15*  GLUCOSE 112* 111* 172*  BUN 52* 60* 51*  CREATININE 2.30* 2.25* 1.98*  CALCIUM  --  9.3 9.0    GFR: Estimated Creatinine Clearance: 26.9 mL/min (A) (by C-G formula based on SCr of 1.98 mg/dL (H)).  Liver Function Tests: Recent Labs  Lab 02/19/21 2131  AST 17  ALT 19  ALKPHOS 58  BILITOT 0.4  PROT 6.9  ALBUMIN 3.1*    Recent Labs  Lab 02/19/21 2131  LIPASE 52*     Recent Results (from the past 240 hour(s))  Resp Panel by RT-PCR (Flu A&B, Covid) Nasopharyngeal Swab     Status: None   Collection Time: 02/19/21  8:50 PM   Specimen: Nasopharyngeal Swab; Nasopharyngeal(NP) swabs in vial  transport medium  Result Value Ref Range Status   SARS Coronavirus 2 by RT PCR NEGATIVE NEGATIVE Final    Comment: (NOTE) SARS-CoV-2 target nucleic acids are NOT DETECTED.  The SARS-CoV-2 RNA is generally detectable in upper respiratory specimens during the acute phase of infection. The lowest concentration of SARS-CoV-2 viral copies this assay can detect is 138 copies/mL. A negative result does not preclude SARS-Cov-2 infection and should not be used as the sole basis for treatment or other patient management decisions. A negative result may occur with  improper specimen collection/handling, submission of specimen other than nasopharyngeal swab, presence of viral mutation(s) within the areas targeted by this assay, and inadequate number of viral copies(<138 copies/mL). A negative result must be combined with clinical observations, patient history, and epidemiological information. The expected result is Negative.  Fact Sheet for  Patients:  EntrepreneurPulse.com.au  Fact Sheet for Healthcare Providers:  IncredibleEmployment.be  This test is no t yet approved or cleared by the [place] FDA and  has been authorized for detection and/or diagnosis of SARS-CoV-2 by FDA under an Emergency Use Authorization (EUA). This EUA will remain  in effect (meaning this test can be used) for the duration of the COVID-19 declaration under Section 564(b)(1) of the Act, 21 U.S.C.section 360bbb-3(b)(1), unless the authorization is terminated  or revoked sooner.       Influenza A by PCR NEGATIVE NEGATIVE Final   Influenza B by PCR NEGATIVE NEGATIVE Final    Comment: (NOTE) The Xpert Xpress SARS-CoV-2/FLU/RSV plus assay is intended as an aid in the diagnosis of influenza from Nasopharyngeal swab specimens and should not be used as a sole basis for treatment. Nasal washings and aspirates are unacceptable for Xpert Xpress SARS-CoV-2/FLU/RSV testing.  Fact  Sheet for Patients: EntrepreneurPulse.com.au  Fact Sheet for Healthcare Providers: IncredibleEmployment.be  This test is not yet approved or cleared by the [place] FDA and has been authorized for detection and/or diagnosis of SARS-CoV-2 by FDA under an Emergency Use Authorization (EUA). This EUA will remain in effect (meaning this test can be used) for the duration of the COVID-19 declaration under Section 564(b)(1) of the Act, 21 U.S.C. section 360bbb-3(b)(1), unless the authorization is terminated or revoked.  Performed at [facility], [place] [address] [place], [place] [address]       Radiology Studies: CT HEAD WO CONTRAST  Result Date: 02/19/2021 CLINICAL DATA:  Trauma, status post fall. EXAM: CT HEAD WITHOUT CONTRAST TECHNIQUE: Contiguous axial images were obtained from the base of the skull through the vertex without intravenous contrast. COMPARISON:  Head CT 3 days ago 02/16/2021 FINDINGS: Brain: Unchanged atrophy and chronic small vessel ischemia. No intracranial hemorrhage, mass effect, or midline shift. No hydrocephalus. The basilar cisterns are patent. Tiny left inferior frontal parenchymal calcification is unchanged. No evidence of territorial infarct or acute ischemia. No extra-axial or intracranial fluid collection. Vascular: Atherosclerosis of skullbase vasculature without hyperdense vessel or abnormal calcification. Skull: No fracture or focal lesion. Sinuses/Orbits: No acute findings. Scattered mucous retention cysts in the maxillary sinuses. Bilateral cataract resection. Other: None. IMPRESSION: 1. No acute intracranial abnormality. No skull fracture. 2. Unchanged atrophy and chronic small vessel ischemia. Electronically Signed   By: Keith Rake M.D.   On: 02/19/2021 21:53   CT CERVICAL SPINE WO CONTRAST  Result Date: 02/19/2021 CLINICAL DATA:  Neck trauma.  Fall. EXAM: CT CERVICAL SPINE WITHOUT CONTRAST TECHNIQUE:  Multidetector CT imaging of the cervical spine was performed without intravenous contrast. Multiplanar CT image reconstructions were also generated. COMPARISON:  None. FINDINGS: Alignment: No traumatic subluxation. 3-4 mm anterolisthesis of C7 on T1, 2-3 mm anterolisthesis of T1 on T2, both likely facet mediated. Skull base and vertebrae: No acute fracture. Advanced degenerative change at C1-C2, osteophytes simulating fracture. Moderate pannus. The dens and skull base are intact. Soft tissues and spinal canal: No prevertebral fluid or swelling. No visible canal hematoma. Disc levels: Diffuse degenerative disc disease with disc space narrowing and endplate spurring at all levels, most prominently affecting C5-C6 and C6-C7. There is prominent multilevel facet hypertrophy. Upper chest: No acute or unexpected findings. Other: Carotid calcifications. IMPRESSION: 1. No acute fracture or subluxation of the cervical spine. 2. Advanced multilevel degenerative disc disease and facet hypertrophy. Electronically Signed   By: Keith Rake M.D.   On: 02/19/2021 21:56   DG Chest Port 1  View  Result Date: 02/19/2021 CLINICAL DATA:  near syncope EXAM: PORTABLE CHEST 1 VIEW.  Patient is rotated. COMPARISON:  Chest x-ray 09/16/2019.  CT angio chest 02/03/2021 FINDINGS: Left chest wall 2 lead pacemaker in similar position. The heart and mediastinal contours are unchanged given rotation. Aortic calcification. Biapical pleural/pulmonary scarring. No focal consolidation. No pulmonary edema. No pleural effusion. No pneumothorax. No acute osseous abnormality. IMPRESSION: No active disease. Electronically Signed   By: Iven Finn M.D.   On: 02/19/2021 21:23   CT Renal Stone Study  Result Date: 02/20/2021 CLINICAL DATA:  Urinary retention.  History of prostate cancer. EXAM: CT ABDOMEN AND PELVIS WITHOUT CONTRAST TECHNIQUE: Multidetector CT imaging of the abdomen and pelvis was performed following the standard protocol without IV  contrast. COMPARISON:  02/03/2021. FINDINGS: Lower chest: The heart is normal in size and scattered coronary artery calcifications are noted. Pacemaker leads are present in the heart. A 4 mm nodule is present in the right lower lobe, axial image 8. Hepatobiliary: The liver is within normal limits. Mild intrahepatic and extrahepatic biliary ductal dilatation is seen which is likely due to patient's post cholecystectomy status. Pancreas: Unremarkable. No pancreatic ductal dilatation or surrounding inflammatory changes. Spleen: Normal in size without focal abnormality. Adrenals/Urinary Tract: There is redemonstration of a right adrenal mass measuring 3.2 cm, unchanged. The left adrenal gland is within normal limits. Scattered hypo and hyperdense structures are present in the kidneys bilaterally. Complex masslike lesions are noted in the kidneys bilaterally measuring 2.6 cm in the mid left kidney and 2.6 cm in the mid right kidney. No renal or ureteral calculus or obstructive uropathy bilaterally. The bladder is unremarkable. Stomach/Bowel: The stomach is nondistended. No bowel obstruction, free air or pneumatosis. A few scattered diverticular noted along the colon without evidence of diverticulitis. Right hemicolectomy changes are noted. Vascular/Lymphatic: Aortic atherosclerosis with ectasia of the distal abdominal aorta. 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 The common iliac artery on the left measures 1.8 cm. No abdominal or pelvic lymphadenopathy. Reproductive: The prostate gland is enlarged and contains multiple radiopaque brachytherapy seeds. Other: No free fluid. A small fat containing umbilical hernia is noted. There is a small right inguinal fat containing hernia. Musculoskeletal: Degenerative changes are present in the thoracolumbar spine and bilateral hips. A stable sclerotic lesion is noted in the L2 vertebral body on the right, possible bone island.  IMPRESSION: 1. No renal or ureteral calculus or obstructive uropathy bilaterally. 2. Multiple hyperdense and hypodense lesions in the kidneys bilaterally. Complex masses are noted in the mid kidneys bilaterally, compatible with history of multifocal renal cell carcinoma and slightly increased in size from the prior exam. 3. Enlarged prostate gland containing brachytherapy seeds. 4. Right lower lobe pulmonary nodule. No follow-up needed if patient is low-risk. Non-contrast chest CT can be considered in 12 months if patient is high-risk. This recommendation follows the consensus statement: Guidelines for Management of Incidental Pulmonary Nodules Detected on CT Images: From the Fleischner Society 2017; Radiology 2017; 284:228-243. 5. Aortic atherosclerosis. 6. Coronary artery calcifications. 7. Remaining chronic findings as described above. Electronically Signed   By: Brett Fairy M.D.   On: 02/20/2021 00:28       LOS: 0 days   [facility] Pager on [email or web address]  02/20/2021, 9:27 AM

## 2021-02-20 NOTE — H&P (Signed)
History and Physical    Derrick Long AXK:[id number] DOB: 15-Sep-1926 DOA: 02/19/2021  PCP: [facility] [place]  Patient coming from: Home  I have personally briefly reviewed patient's old medical records in [place]  Chief Complaint: Generalized weakness  HPI: Derrick Long is a 88 y.o. male with medical history significant of HTN, PAF, prostate CA, PPM.  Prostate CA: s/p seed implant ~20 years ago, recurrent metastatic disease with adrenal met confirmed on biopsy as of March 2022.  Also has Renal masses though am unclear if these are metastatic prostate CA vs RCC.  Havent had these biopsied due to risk of bleed.  Pt recently admitted 12/19-12/20/22 for N/V and abd pain.  Felt to be due to gastritis.  Resolved.  Per daughter, no further N/V nor abd pain since discharge.  Has had chronic back pain.  Has a PPM that needs battery replaced in near future.  Today presents to ED with c/o 3 day h/o generalized weakness.  Associated falls, nausea, near-syncope.  No abd pain, no vomiting.  No stroke symptoms.  Has been on new med: Zytiga for prostate CA.  No CP, no cough, no dysuria, no fever.   ED Course: Pt with AKI with creat of 2.3 up from 1.0 on 12/19.  BUN 60.  NAG acidosis with bicarb 16.   Review of Systems: As per HPI, otherwise all review of systems negative.  Past Medical History:  Diagnosis Date   Acid reflux    Anemia    Colon cancer ([place])    per patient, colon cancer more than 10 years ago s/p right hemicolectomy   Ex-cigarette smoker    Gout    Hyperlipemia    Hypertension    Paroxysmal atrial fibrillation ([place]) 2014   Prostate cancer ([place]) 08/08/2013   SEEDS 20 YEARS AGO   Prostate disease     Past Surgical History:  Procedure Laterality Date   BIOPSY  02/05/2021   Procedure: BIOPSY;  Surgeon: Montez Morita, Quillian Quince, MD;  Location: [facility];  Service: Gastroenterology;;   COLON SURGERY     Right hemicolectomy in the setting  of colon cancer, per patient   ESOPHAGOGASTRODUODENOSCOPY (EGD) WITH PROPOFOL N/A 02/05/2021   Procedure: ESOPHAGOGASTRODUODENOSCOPY (EGD) WITH PROPOFOL;  Surgeon: Harvel Quale, MD;  Location: [facility];  Service: Gastroenterology;  Laterality: N/A;   LEFT HEART CATHETERIZATION WITH CORONARY ANGIOGRAM N/A 07/21/2012   Procedure: LEFT HEART CATHETERIZATION WITH CORONARY ANGIOGRAM;  Surgeon: Troy Sine, MD;  [facility] C[facility] LAB;  Service: Cardiovascular;  Laterality: N/A;   PACEMAKER INSERTION     PERMANENT PACEMAKER INSERTION N/A 07/22/2012   Procedure: PERMANENT PACEMAKER INSERTION;  Surgeon: Sanda Klein, MD;  Location: [facility] CATH LAB;  Service: Cardiovascular;  Laterality: N/A;   PROSTATE SURGERY       reports that he quit smoking about 18 years ago. He has never used smokeless tobacco. He reports that he does not drink alcohol and does not use drugs.  No Known Allergies  Family History  Problem Relation Age of Onset   Cancer Neg Hx      Prior to Admission medications   Medication Sig Start Date End Date Taking? Authorizing Provider  abiraterone acetate (ZYTIGA) 250 MG tablet Take 2 tablets (500 mg total) by mouth daily. Take on an empty stomach 1 hour before or 2 hours after a meal 01/15/21  Yes Derek Jack, MD  acetaminophen (TYLENOL) 650 MG CR tablet Take 1,300 mg by mouth  every 8 (eight) hours as needed for pain.   Yes [provider]  amLODipine (NORVASC) 10 MG tablet Take 10 mg by mouth daily.   Yes [provider]  atorvastatin (LIPITOR) 20 MG tablet Take 1 tablet (20 mg total) by mouth daily at 6 PM. Patient taking differently: Take 20 mg by mouth every evening. 07/23/12  Yes Rosita Fire, Brittainy M, PA-C  carvedilol (COREG) 25 MG tablet Take 50 mg by mouth 2 (two) times daily with a meal.   Yes [provider]  Cholecalciferol 25 MCG (1000 UT) tablet Take 1,000 Units by mouth daily.   Yes [provider]   diclofenac sodium (VOLTAREN) 1 % GEL Apply 2 g topically 4 (four) times daily. Patient taking differently: Apply 2 g topically 4 (four) times daily as needed (pain). 06/19/17  Yes Rancour, Annie Main, MD  Ensure (ENSURE) Take 237 mLs by mouth daily as needed (nutrition). Vanilla or chocolate   Yes [provider]  ferrous sulfate 324 MG TBEC Take 324 mg by mouth 2 (two) times daily.   Yes [provider]  fluticasone (FLONASE) 50 MCG/ACT nasal spray Place 2 sprays into both nostrils daily. Patient taking differently: Place 2 sprays into both nostrils daily as needed for allergies. 02/15/16  Yes Ward, Delice Bison, DO  HYDROcodone-acetaminophen (NORCO/VICODIN) 5-325 MG tablet Take 1 tablet by mouth every 6 (six) hours as needed for moderate pain. 05/03/20  Yes Milton Ferguson, MD  megestrol (MEGACE) 400 MG/10ML suspension Take 10 mLs (400 mg total) by mouth 2 (two) times daily. 01/15/21  Yes Derek Jack, MD  ondansetron (ZOFRAN-ODT) 8 MG disintegrating tablet Take 1 tablet (8 mg total) by mouth every 8 (eight) hours as needed for nausea or vomiting. 02/05/21  Yes Barton Dubois, MD  pantoprazole (PROTONIX) 40 MG tablet Take 1 tablet (40 mg total) by mouth 2 (two) times daily. 02/05/21 02/05/22 Yes Barton Dubois, MD  potassium chloride SA (KLOR-CON) 20 MEQ tablet Take 20 mEq by mouth daily. 02/28/20  Yes [provider]  predniSONE (DELTASONE) 5 MG tablet Take 1 tablet (5 mg total) by mouth daily with breakfast. 01/15/21  Yes Derek Jack, MD  rivaroxaban (XARELTO) 20 MG TABS tablet Take 1 tablet (20 mg total) by mouth daily with supper. 10/30/13  Yes Croitoru, Mihai, MD  silodosin (RAPAFLO) 8 MG CAPS capsule Take 1 capsule (8 mg total) by mouth at bedtime. Patient taking differently: Take 8 mg by mouth every evening. 01/21/21  Yes Festus Aloe, MD    Physical Exam: Vitals:   02/19/21 2245 02/19/21 2300 02/19/21 2315 02/19/21 2330  BP: (!) 160/74 (!) 142/58  (!) 149/54 (!) 147/69  Pulse: (!) 58 (!) 59 (!) 56 60  Resp: (!) 22 18 20 15   Temp:      TempSrc:      SpO2: 99% 98% 98% 98%    Constitutional: NAD, calm, comfortable Eyes: PERRL, lids and conjunctivae normal ENMT: Mucous membranes are moist. Posterior pharynx clear of any exudate or lesions.Normal dentition.  Neck: normal, supple, no masses, no thyromegaly Respiratory: clear to auscultation bilaterally, no wheezing, no crackles. Normal respiratory effort. No accessory muscle use.  Cardiovascular: PPM present, 3/6 SEM. Abdomen: no tenderness, no masses palpated. No hepatosplenomegaly. Bowel sounds positive.  Musculoskeletal: no clubbing / cyanosis. No joint deformity upper and lower extremities. Good ROM, no contractures. Normal muscle tone.  Skin: no rashes, lesions, ulcers. No induration Neurologic: CN 2-12 grossly intact. Sensation intact, DTR normal. Strength 5/5 in all 4.  Psychiatric: Normal judgment and insight. Alert and oriented x 3. Normal mood.    Labs on Admission: I have personally reviewed following labs and imaging studies  CBC: Recent Labs  Lab 02/19/21 2128 02/19/21 2131  WBC  --  6.6  NEUTROABS  --  4.6  HGB 12.9* 13.2  HCT 38.0* 37.5*  MCV  --  97.2  PLT  --  382   Basic Metabolic Panel: Recent Labs  Lab 02/19/21 2128 02/19/21 2131  NA 134* 131*  K 4.8 4.6  CL 108 106  CO2  --  16*  GLUCOSE 112* 111*  BUN 52* 60*  CREATININE 2.30* 2.25*  CALCIUM  --  9.3   GFR: Estimated Creatinine Clearance: 23.7 mL/min (A) (by C-G formula based on SCr of 2.25 mg/dL (H)). Liver Function Tests: Recent Labs  Lab 02/19/21 2131  AST 17  ALT 19  ALKPHOS 58  BILITOT 0.4  PROT 6.9  ALBUMIN 3.1*   Recent Labs  Lab 02/19/21 2131  LIPASE 52*   No results for input(s): AMMONIA in the last 168 hours. Coagulation Profile: No results for input(s): INR, PROTIME in the last 168 hours. Cardiac Enzymes: No results for input(s): CKTOTAL, CKMB, CKMBINDEX,  TROPONINI in the last 168 hours. BNP (last 3 results) No results for input(s): PROBNP in the last 8760 hours. HbA1C: No results for input(s): HGBA1C in the last 72 hours. CBG: No results for input(s): GLUCAP in the last 168 hours. Lipid Profile: No results for input(s): CHOL, HDL, LDLCALC, TRIG, CHOLHDL, LDLDIRECT in the last 72 hours. Thyroid Function Tests: No results for input(s): TSH, T4TOTAL, FREET4, T3FREE, THYROIDAB in the last 72 hours. Anemia Panel: No results for input(s): VITAMINB12, FOLATE, FERRITIN, TIBC, IRON, RETICCTPCT in the last 72 hours. Urine analysis:    Component Value Date/Time   COLORURINE YELLOW 02/19/2021 2300   APPEARANCEUR CLEAR 02/19/2021 2300   APPEARANCEUR Clear 01/20/2021 0938   LABSPEC 1.012 02/19/2021 2300   PHURINE 5.0 02/19/2021 2300   GLUCOSEU NEGATIVE 02/19/2021 2300   HGBUR NEGATIVE 02/19/2021 2300   HGBUR negative 06/24/2007 0833   BILIRUBINUR NEGATIVE 02/19/2021 2300   BILIRUBINUR Negative 01/20/2021 0938   KETONESUR NEGATIVE 02/19/2021 2300   PROTEINUR NEGATIVE 02/19/2021 2300   UROBILINOGEN 1.0 07/29/2013 2032   NITRITE NEGATIVE 02/19/2021 2300   LEUKOCYTESUR NEGATIVE 02/19/2021 2300    Radiological Exams on Admission: CT HEAD WO CONTRAST  Result Date: 02/19/2021 CLINICAL DATA:  Trauma, status post fall. EXAM: CT HEAD WITHOUT CONTRAST TECHNIQUE: Contiguous axial images were obtained from the base of the skull through the vertex without intravenous contrast. COMPARISON:  Head CT 3 days ago 02/16/2021 FINDINGS: Brain: Unchanged atrophy and chronic small vessel ischemia. No intracranial hemorrhage, mass effect, or midline shift. No hydrocephalus. The basilar cisterns are patent. Tiny left inferior frontal parenchymal calcification is unchanged. No evidence of territorial infarct or acute ischemia. No extra-axial or intracranial fluid collection. Vascular: Atherosclerosis of skullbase vasculature without hyperdense vessel or abnormal  calcification. Skull: No fracture or focal lesion. Sinuses/Orbits: No acute findings. Scattered mucous retention cysts in the maxillary sinuses. Bilateral cataract resection. Other: None. IMPRESSION: 1. No acute intracranial abnormality. No skull fracture. 2. Unchanged atrophy and chronic small vessel ischemia. Electronically Signed   By: Keith Rake M.D.   On: 02/19/2021 21:53   CT CERVICAL SPINE WO CONTRAST  Result Date: 02/19/2021 CLINICAL DATA:  Neck trauma.  Fall. EXAM: CT CERVICAL SPINE WITHOUT CONTRAST TECHNIQUE: Multidetector CT imaging of the cervical spine was performed  without intravenous contrast. Multiplanar CT image reconstructions were also generated. COMPARISON:  None. FINDINGS: Alignment: No traumatic subluxation. 3-4 mm anterolisthesis of C7 on T1, 2-3 mm anterolisthesis of T1 on T2, both likely facet mediated. Skull base and vertebrae: No acute fracture. Advanced degenerative change at C1-C2, osteophytes simulating fracture. Moderate pannus. The dens and skull base are intact. Soft tissues and spinal canal: No prevertebral fluid or swelling. No visible canal hematoma. Disc levels: Diffuse degenerative disc disease with disc space narrowing and endplate spurring at all levels, most prominently affecting C5-C6 and C6-C7. There is prominent multilevel facet hypertrophy. Upper chest: No acute or unexpected findings. Other: Carotid calcifications. IMPRESSION: 1. No acute fracture or subluxation of the cervical spine. 2. Advanced multilevel degenerative disc disease and facet hypertrophy. Electronically Signed   By: Keith Rake M.D.   On: 02/19/2021 21:56   DG Chest Port 1 View  Result Date: 02/19/2021 CLINICAL DATA:  near syncope EXAM: PORTABLE CHEST 1 VIEW.  Patient is rotated. COMPARISON:  Chest x-ray 09/16/2019.  CT angio chest 02/03/2021 FINDINGS: Left chest wall 2 lead pacemaker in similar position. The heart and mediastinal contours are unchanged given rotation. Aortic  calcification. Biapical pleural/pulmonary scarring. No focal consolidation. No pulmonary edema. No pleural effusion. No pneumothorax. No acute osseous abnormality. IMPRESSION: No active disease. Electronically Signed   By: Iven Finn M.D.   On: 02/19/2021 21:23    EKG: Independently reviewed.  Assessment/Plan Principal Problem:   AKI (acute kidney injury) ([place]) Active Problems:   Essential hypertension   History of atrial fibrillation   Bilateral kidney masses   Prostate cancer ([place])    AKI - ? Due to dehydration CT renal stone study results are pending, but it doesn't seem to have any obvious findings of obstruction to me at least on my non-radiologist unofficial read. Strict intake and output IVF: 500cc bolus in ED, then will put on isotonic bicarb for the NAG acidosis Repeat BMP in AM Nephrology consult in AM if not rapidly improving PAF - Cont Xarelto Cont coreg HTN - Cont coreg Cont amlodipine Metastatic prostate CA - Cont Zytiga Cont megace Cont prednisone (looks like he takes this 32m daily chronically) B renal masses - RCC vs metastatic prostate CA? Will see if any major changes on today's CT Cardiac murmur - Severe LVH but nl EF, grade 1 DD on echo last month Mild AS  DVT prophylaxis: Xarelto Code Status: Full Family Communication: Daughter at bedside Disposition Plan: Home after renal recovery Consults called: none Admission status: Admit to inpatient  Severity of Illness: The appropriate patient status for this patient is INPATIENT. Inpatient status is judged to be reasonable and necessary in order to provide the required intensity of service to ensure the patient's safety. The patient's presenting symptoms, physical exam findings, and initial radiographic and laboratory data in the context of their chronic comorbidities is felt to place them at high risk for further clinical deterioration. Furthermore, it is not anticipated that the patient will be  medically stable for discharge from the hospital within 2 midnights of admission.   Patient has acute kidney injury.  Patient has one of the following: Increase in Serum Creatinine >0.3 mg/dL within 48h Increase in Serum Creatinine > 1.5 times baseline known or presumed to have been within the last 7 days Urine volume < 0.5 ml/kg/hr for 6 hours   * I certify that at the point of admission it is my clinical judgment that the patient will require inpatient hospital care  spanning beyond 2 midnights from the point of admission due to high intensity of service, high risk for further deterioration and high frequency of surveillance required.*   Gamaliel Charney M. DO [facility]  How to contact the [facility] Attending or Consulting provider [place] or covering provider during after hours [place], for this patient?  Check the care team in [facility] and look for a) attending/consulting [facility] provider listed and b) the [facility] team listed Log into [email or web address]  Amion Physician Scheduling and messaging for groups and whole hospitals  On call and physician scheduling software for group practices, residents, hospitalists and other medical providers for call, clinic, rotation and shift schedules. OnCall [employer] is a hospital-wide system for scheduling doctors and paging doctors on call. EasyPlot is for scientific plotting and data analysis.  [email or web address]  and use [place]'s universal password to access. If you do not have the password, please contact the hospital operator.  Locate the [facility] provider you are looking for under [facility] and page to a number that you can be directly reached. If you still have difficulty reaching the provider, please page the [facility] (Director on Call) for the Hospitalists listed on amion for assistance.  02/20/2021, 12:29 AM

## 2021-02-20 NOTE — ED Notes (Signed)
Admitting MD ( Dr. Fabio Neighbors ) at bedside evaluating patient , plan of care/admission explained to patient and family member.

## 2021-02-21 LAB — CBC
HCT: 32.2 % — ABNORMAL LOW (ref 39.0–52.0)
Hemoglobin: 11.4 g/dL — ABNORMAL LOW (ref 13.0–17.0)
MCH: 33.6 pg (ref 26.0–34.0)
MCHC: 35.4 g/dL (ref 30.0–36.0)
MCV: 95 fL (ref 80.0–100.0)
Platelets: 278 10*3/uL (ref 150–400)
RBC: 3.39 MIL/uL — ABNORMAL LOW (ref 4.22–5.81)
RDW: 13.1 % (ref 11.5–15.5)
WBC: 5.9 10*3/uL (ref 4.0–10.5)
nRBC: 0 % (ref 0.0–0.2)

## 2021-02-21 LAB — BASIC METABOLIC PANEL
Anion gap: 6 (ref 5–15)
BUN: 35 mg/dL — ABNORMAL HIGH (ref 8–23)
CO2: 20 mmol/L — ABNORMAL LOW (ref 22–32)
Calcium: 8.8 mg/dL — ABNORMAL LOW (ref 8.9–10.3)
Chloride: 107 mmol/L (ref 98–111)
Creatinine, Ser: 1.44 mg/dL — ABNORMAL HIGH (ref 0.61–1.24)
GFR, Estimated: 46 mL/min — ABNORMAL LOW (ref >60)
Glucose, Bld: 104 mg/dL — ABNORMAL HIGH (ref 70–99)
Potassium: 4 mmol/L (ref 3.5–5.1)
Sodium: 133 mmol/L — ABNORMAL LOW (ref 135–145)

## 2021-02-21 MED ORDER — ABIRATERONE ACETATE 250 MG PO TABS
500.0000 mg | ORAL_TABLET | Freq: Two times a day (BID) | ORAL | Status: DC
  Administered 2021-02-21 (×2): 500 mg via ORAL
  Filled 2021-02-21 (×3): qty 2

## 2021-02-21 MED ORDER — SODIUM CHLORIDE 0.45 % IV SOLN: INTRAVENOUS | Status: DC

## 2021-02-21 NOTE — Evaluation (Signed)
Occupational Therapy Evaluation Patient Details Name: Derrick Long MRN: [id number] DOB: 1926-03-10 Today's Date: 02/21/2021   History of Present Illness 88 yo male presenting to ED on 02/19/21 with generalized weakness and mutiple near syncopal episodes. Recently went to [facility] ED for fall on 02/16/21; CT of head negative. PMH including HTN, A fib s/p pacemaker, prostate cancer on hormone therapy, colon cancer s/p R hemicolectomy, and gout.   Clinical Impression   PTA, pt was living with his daughter and granddaughter and was independent with ADLs, light IADLs, and driving; using RW and SPC as needed. Pt currently requiring Supervision-Min Guard A for ADLs and functional mobility. Answered all pt questions. Recommend dc home with family once medically stable per physician. All acute OT needs met and will sign off. Thank you.  BP's as follows: sitting 115/65, static stand 85/61 and feeling weak, return to sitting 126/65, standing post walk 114/66 - pt asymptomatic with walking      Recommendations for follow up therapy are one component of a multi-disciplinary discharge planning process, led by the attending physician.  Recommendations may be updated based on patient status, additional functional criteria and insurance authorization.   Follow Up Recommendations  No OT follow up    Assistance Recommended at Discharge Intermittent Supervision/Assistance  Patient can return home with the following      Functional Status Assessment  Patient has had a recent decline in their functional status and demonstrates the ability to make significant improvements in function in a reasonable and predictable amount of time.  Equipment Recommendations  None recommended by OT (Discussed son ordering shower seat through [place])    Recommendations for Other Services       Precautions / Restrictions Precautions Precautions: Fall Precaution Comments: moderate risk per chart      Mobility Bed  Mobility Overal bed mobility: Independent             General bed mobility comments: sitting EOB at arrival    Transfers Overall transfer level: Needs assistance Equipment used: Rolling walker (2 wheels);None Transfers: Sit to/from Stand Sit to Stand: Min guard           General transfer comment: min guard for safety due to syncope at admission; performed x 2      Balance Overall balance assessment: Independent   Sitting balance-Leahy Scale: Normal     Standing balance support: No upper extremity supported Standing balance-Leahy Scale: Good                             ADL either performed or assessed with clinical judgement   ADL Overall ADL's : Needs assistance/impaired Eating/Feeding: Set up;Sitting   Grooming: Set up;Supervision/safety;Sitting   Upper Body Bathing: Set up;Supervision/ safety;Sitting   Lower Body Bathing: Min guard;Sit to/from stand   Upper Body Dressing : Supervision/safety;Set up;Sitting   Lower Body Dressing: Min guard;Sit to/from stand   Toilet Transfer: Min guard;Ambulation;Rolling walker (2 wheels)           Functional mobility during ADLs: Min guard;Rolling walker (2 wheels) General ADL Comments: Pt performing  ADLs and functional mobility at [employer] A level     Vision Baseline Vision/History: 1 Wears glasses (as needed)       Perception     Praxis      Pertinent Vitals/Pain Pain Assessment: No/denies pain     Hand Dominance Right   Extremity/Trunk Assessment Upper Extremity Assessment Upper Extremity Assessment: Overall  WFL for tasks assessed (limited shoulder flexion at baseline)   Lower Extremity Assessment Lower Extremity Assessment: Defer to PT evaluation   Cervical / Trunk Assessment Cervical / Trunk Assessment: Normal   Communication Communication Communication: No difficulties   Cognition Arousal/Alertness: Awake/alert Behavior During Therapy: WFL for tasks  assessed/performed Overall Cognitive Status: Within Functional Limits for tasks assessed                                       General Comments  BP's as follows: sitting 115/65, static stand 85/61 and feeling weak, return to sitting 126/65, standing post walk 114/66 - pt asymptomatic with walking    Exercises     Shoulder Instructions      Home Living Family/patient expects to be discharged to:: Private residence Living Arrangements: Children Available Help at Discharge: Family;Available 24 hours/day Type of Home: House Home Access: Stairs to enter [employer] of Steps: 2 Entrance Stairs-Rails: None Home Layout: One level     Bathroom Shower/Tub: Tub/shower unit;Door   [employer] Toilet: Standard     Home Equipment: Conservation officer, nature (2 wheels);Cane - single point          Prior Functioning/Environment Prior Level of Function : Independent/Modified Independent             Mobility Comments: Using RW as needed; could ambulate in community ADLs Comments: ADLs and driving short distances. daughter does IADLs; likes to fish        OT Problem List: Decreased strength;Decreased range of motion;Decreased activity tolerance;Impaired balance (sitting and/or standing);Decreased knowledge of use of DME or AE;Decreased knowledge of precautions      OT Treatment/Interventions: Self-care/ADL training;Therapeutic exercise;Energy conservation;DME and/or AE instruction;Therapeutic activities;Patient/family education    OT Goals(Current goals can be found in the care plan section) Acute Rehab OT Goals Patient Stated Goal: Go home OT Goal Formulation: All assessment and education complete, DC therapy  OT Frequency: Min 2X/week    Co-evaluation   Reason for Co-Treatment: Complexity of the patient's impairments (multi-system involvement) (admitted with syncope, 88 yo) PT goals addressed during session: Mobility/safety with mobility        AM-PAC OT "6  Clicks" Daily Activity     Outcome Measure Help from another person eating meals?: None Help from another person taking care of personal grooming?: None Help from another person toileting, which includes using toliet, bedpan, or urinal?: A Little Help from another person bathing (including washing, rinsing, drying)?: A Little Help from another person to put on and taking off regular upper body clothing?: A Little Help from another person to put on and taking off regular lower body clothing?: A Little 6 Click Score: 20   End of Session Equipment Utilized During Treatment: Rolling walker (2 wheels);Gait belt Nurse Communication: Mobility status  Activity Tolerance: Patient tolerated treatment well Patient left: in bed;with call bell/phone within reach;with family/visitor present (EOB)  OT Visit Diagnosis: Unsteadiness on feet (R26.81);Other abnormalities of gait and mobility (R26.89);Muscle weakness (generalized) (M62.81)                Time: 4562-5638 OT Time Calculation (min): 24 min Charges:  OT General Charges $OT Visit: 1 Visit OT Evaluation $OT Eval Low Complexity: [place], OTR/L Acute Rehab Pager: [phone] Office: [place] 02/21/2021, 12:40 PM

## 2021-02-21 NOTE — Evaluation (Signed)
Physical Therapy Evaluation Patient Details Name: Derrick Long: [id number] DOB: 03-Oct-1926 Today's Date: 02/21/2021  History of Present Illness  Pt is 88 yo male admitted on 02/19/21 with weakness and near syncope - found to have AKI and hyponatremia.  Pt has hx of HTN , PAF, pacemaker,, prostate CA  s/p seed implant ~20 years ago, recurrent metastatic disease with adrenal met confirmed on biopsy as of March 2022, and renal masses.  Clinical Impression  Pt admitted with above diagnosis.  At baseline pt is independent but does have support from family if needed.  Today, he did get orthostatic BP with static standing and felt weak.  However, had pt return to sitting , allowed BP to recover, and then ambulated (supervision of 2 for safety) - pt asymptomatic and BP stable.  Educated on orthostatic BP and safety. Pt appears near baseline and has no further PT needs.   Recommend ambulation with nursing, mobility team, and family as long as remain asymptomatic.     Recommendations for follow up therapy are one component of a multi-disciplinary discharge planning process, led by the attending physician.  Recommendations may be updated based on patient status, additional functional criteria and insurance authorization.  Follow Up Recommendations No PT follow up    Assistance Recommended at Discharge Set up Supervision/Assistance  Patient can return home with the following  Assistance with cooking/housework    Equipment Recommendations Other (comment) (shower chair)  Recommendations for Other Services       Functional Status Assessment Patient has not had a recent decline in their functional status     Precautions / Restrictions Precautions Precautions: Fall Precaution Comments: moderate risk per chart      Mobility  Bed Mobility Overal bed mobility: Independent             General bed mobility comments: sitting EOB at arrival    Transfers Overall transfer level: Needs  assistance Equipment used: Rolling walker (2 wheels);None Transfers: Sit to/from Stand Sit to Stand: Min guard           General transfer comment: min guard for safety due to syncope at admission; performed x 2    Ambulation/Gait Ambulation/Gait assistance: Min guard;Supervision Gait Distance (Feet): 150 Feet Assistive device: Rolling walker (2 wheels);None Gait Pattern/deviations: Step-through pattern Gait velocity: normal for pt     General Gait Details: Min guard progerssing to close supervision; near normal gait  Stairs            Wheelchair Mobility    Modified Rankin (Stroke Patients Only)       Balance Overall balance assessment: Independent   Sitting balance-Leahy Scale: Normal     Standing balance support: No upper extremity supported Standing balance-Leahy Scale: Good                               Pertinent Vitals/Pain Pain Assessment: No/denies pain    Home Living Family/patient expects to be discharged to:: Private residence Living Arrangements: Children Available Help at Discharge: Family;Available 24 hours/day Type of Home: House Home Access: Stairs to enter Entrance Stairs-Rails: None Entrance Stairs-Number of Steps: 2   Home Layout: One level Home Equipment: Conservation officer, nature (2 wheels);Cane - single point      Prior Function Prior Level of Function : Independent/Modified Independent             Mobility Comments: Using RW as needed; could ambulate in community ADLs Comments: ADLs  and driving short distances. daughter does IADLs; likes to fish     Hand Dominance   Dominant Hand: Right    Extremity/Trunk Assessment   Upper Extremity Assessment Upper Extremity Assessment: Defer to OT evaluation    Lower Extremity Assessment Lower Extremity Assessment: Overall WFL for tasks assessed (ROM WFL; MMT 5/5)    Cervical / Trunk Assessment Cervical / Trunk Assessment: Normal  Communication   Communication: No  difficulties  Cognition Arousal/Alertness: Awake/alert Behavior During Therapy: WFL for tasks assessed/performed Overall Cognitive Status: Within Functional Limits for tasks assessed                                          General Comments General comments (skin integrity, edema, etc.): BP's as follows: sitting 115/65, static stand 85/61 and feeling weak, return to sitting 126/65, standing post walk 114/66 - pt asymptomatic with walking    Exercises     Assessment/Plan    PT Assessment Patient does not need any further PT services  PT Problem List         PT Treatment Interventions      PT Goals (Current goals can be found in the Care Plan section)  Acute Rehab PT Goals Patient Stated Goal: return home PT Goal Formulation: All assessment and education complete, DC therapy    Frequency       Co-evaluation PT/OT/SLP Co-Evaluation/Treatment: Yes Reason for Co-Treatment: Complexity of the patient's impairments (multi-system involvement) (admitted with syncope, 88 yo) PT goals addressed during session: Mobility/safety with mobility         AM-PAC PT "6 Clicks" Mobility  Outcome Measure Help needed turning from your back to your side while in a flat bed without using bedrails?: None Help needed moving from lying on your back to sitting on the side of a flat bed without using bedrails?: None Help needed moving to and from a bed to a chair (including a wheelchair)?: A Little Help needed standing up from a chair using your arms (e.g., wheelchair or bedside chair)?: A Little Help needed to walk in hospital room?: A Little Help needed climbing 3-5 steps with a railing? : A Little 6 Click Score: 20    End of Session Equipment Utilized During Treatment: Gait belt Activity Tolerance: Patient tolerated treatment well Patient left: in bed;with call bell/phone within reach Nurse Communication: Mobility status;Other (comment) (orthostatic with static stand but did  well with ambulation) PT Visit Diagnosis: Other abnormalities of gait and mobility (R26.89)    Time: 5397-6734 PT Time Calculation (min) (ACUTE ONLY): 24 min   Charges:   PT Evaluation $PT Eval Low Complexity: 1 Low          Yani Coventry, PT Acute Rehab Services Pager [phone] [facility] [phone]   Karlton Lemon 02/21/2021, 11:21 AM

## 2021-02-21 NOTE — Progress Notes (Signed)
[facility] PROGRESS NOTE   Derrick Long VVO:[id number] DOB: 26-Mar-1926 DOA: 02/19/2021  [facility]P: [facility]  Brief History/Interval Summary: 88 y.o. male with medical history significant of HTN, PAF, PPM, Prostate CA: s/p seed implant ~20 years ago, recurrent metastatic disease with adrenal met confirmed on biopsy as of March 2022.  Also has Renal masses though am unclear if these are metastatic prostate CA vs RCC.  Havent had these biopsied due to risk of bleed. Recently admitted 12/19-12/20/22 for N/V and abd pain.  Felt to be due to gastritis.  GI symptoms have resolved.  Patient presented with generalized weakness and near syncope.  Pacemaker was interrogated in the emergency department and no arrhythmias were recorded.  Battery was low but functioning.  Noted to have acute kidney injury on blood work.  Hospitalize for further management.  Reason for Visit: Ac Kidney injury  Consultants: None  Procedures: None yet    Subjective/Interval History: Patient mentions that he continues to improve.  Still feels fatigued at times.  His daughter is at the bedside.  Patient denies any nausea vomiting.  No chest pain or shortness of breath.     Assessment/Plan:  Acute kidney injury/metabolic acidosis/hyponatremia Baseline renal function normal with creatinine between 1.0-1.2.  Presented with creatinine of 2.3.  BUN was elevated at 52.  He was noted to have low bicarbonate level as well.  Started on fluids.   All of this is likely due to dehydration from recent GI illness along with poor oral intake the last several days.   CT renal study did not show any hydronephrosis.  UA unremarkable. Patient was placed on IV fluids with bicarbonate.  Renal function continues to improve.  Cut back on IV fluids and changed to half-normal saline.  Continue to avoid nephrotoxic agents.  Monitor urine output.  Paroxysmal atrial fibrillation Stable.  Continue Xarelto and carvedilol.   Pacemaker was interrogated in the emergency department and no arrhythmias were noted.  Battery was low but pacemaker is functioning normally.  Need to determine who his cardiologist is and where they are located.  Could be at the [place].  Generalized weakness/near syncope Likely due to dehydration.  No focal neurological deficits noted.  No arrhythmias noted on pacemaker interrogation.  PT and OT evaluation is pending.  Essential hypertension Pressure is reasonably well controlled.  Continue amlodipine and carvedilol.  Normocytic anemia Mild drop in hemoglobin is likely dilutional.  No evidence of overt bleeding  Metastatic prostate cancer Followed by medical oncology.  Noted to be on Zytiga Megace Flomax and prednisone chronically.  Bilateral renal masses Thought to be renal cell carcinoma versus metastatic lesion from prostate cancer.  Follow-up with medical oncology.    DVT Prophylaxis: Xarelto Code Status: Full code Family Communication: Discussed with patient as well as with his daughter who was at the bedside. Disposition Plan: Hopefully return home when improved  Status is: Inpatient  Remains inpatient appropriate because: Acute kidney injury, generalized weakness and deconditioning    Medications: Scheduled:  abiraterone acetate  500 mg Oral Daily   amLODipine  10 mg Oral Daily   atorvastatin  20 mg Oral QPM   carvedilol  50 mg Oral BID WC   cholecalciferol  1,000 Units Oral Daily   ferrous sulfate  324 mg Oral BID   megestrol  400 mg Oral BID   pantoprazole  40 mg Oral BID   predniSONE  5 mg Oral Q breakfast   rivaroxaban  15 mg Oral  Q supper   tamsulosin  0.4 mg Oral QPC supper   Continuous:  sodium chloride 75 mL/hr at 02/21/21 0704   YJE:[id number] **OR** acetaminophen, diclofenac Sodium, HYDROcodone-acetaminophen, naphazoline-glycerin, ondansetron **OR** ondansetron (ZOFRAN) IV  Antibiotics: Anti-infectives (From admission, onward)    None        Objective:  Vital Signs  Vitals:   02/20/21 2033 02/21/21 0031 02/21/21 0501 02/21/21 0903  BP: 125/65 125/63 (!) 159/77 (!) 142/49  Pulse: 62 62 61 (!) 56  Resp: 18 18 18 17   Temp: 98.2 F (36.8 C) 98.3 F (36.8 C) 97.9 F (36.6 C) 97.6 F (36.4 C)  TempSrc: Oral Oral Oral Oral  SpO2: 97% 98% 94% 100%    Intake/Output Summary (Last 24 hours) at 02/21/2021 1022 Last data filed at 02/21/2021 0900 Gross per 24 hour  Intake 1229.73 ml  Output 150 ml  Net 1079.73 ml    There were no vitals filed for this visit.  General appearance: Awake alert.  In no distress Resp: Clear to auscultation bilaterally.  Normal effort Cardio: S1-S2 is normal regular.  No S3-S4.  No rubs murmurs or bruit GI: Abdomen is soft.  Nontender nondistended.  Bowel sounds are present normal.  No masses organomegaly Extremities: No edema.   Neurologic:  No focal neurological deficits.    Lab Results:  Data Reviewed: I have personally reviewed following labs and imaging studies  CBC: Recent Labs  Lab 02/19/21 2128 02/19/21 2131 02/20/21 0337 02/21/21 0121  WBC  --  6.6 6.2 5.9  NEUTROABS  --  4.6  --   --   HGB 12.9* 13.2 12.0* 11.4*  HCT 38.0* 37.5* 35.3* 32.2*  MCV  --  97.2 99.7 95.0  PLT  --  326 281 278     Basic Metabolic Panel: Recent Labs  Lab 02/19/21 2128 02/19/21 2131 02/20/21 0337 02/21/21 0121  NA 134* 131* 131* 133*  K 4.8 4.6 3.9 4.0  CL 108 106 110 107  CO2  --  16* 15* 20*  GLUCOSE 112* 111* 172* 104*  BUN 52* 60* 51* 35*  CREATININE 2.30* 2.25* 1.98* 1.44*  CALCIUM  --  9.3 9.0 8.8*     GFR: Estimated Creatinine Clearance: 37 mL/min (A) (by C-G formula based on SCr of 1.44 mg/dL (H)).  Liver Function Tests: Recent Labs  Lab 02/19/21 2131  AST 17  ALT 19  ALKPHOS 58  BILITOT 0.4  PROT 6.9  ALBUMIN 3.1*     Recent Labs  Lab 02/19/21 2131  LIPASE 52*      Recent Results (from the past 240 hour(s))  Resp Panel by RT-PCR (Flu A&B, Covid)  Nasopharyngeal Swab     Status: None   Collection Time: 02/19/21  8:50 PM   Specimen: Nasopharyngeal Swab; Nasopharyngeal(NP) swabs in vial transport medium  Result Value Ref Range Status   SARS Coronavirus 2 by RT PCR NEGATIVE NEGATIVE Final    Comment: (NOTE) SARS-CoV-2 target nucleic acids are NOT DETECTED.  The SARS-CoV-2 RNA is generally detectable in upper respiratory specimens during the acute phase of infection. The lowest concentration of SARS-CoV-2 viral copies this assay can detect is 138 copies/mL. A negative result does not preclude SARS-Cov-2 infection and should not be used as the sole basis for treatment or other patient management decisions. A negative result may occur with  improper specimen collection/handling, submission of specimen other than nasopharyngeal swab, presence of viral mutation(s) within the areas targeted by this assay, and inadequate number of viral  copies(<138 copies/mL). A negative result must be combined with clinical observations, patient history, and epidemiological information. The expected result is Negative.  Fact Sheet for Patients:  EntrepreneurPulse.com.au  Fact Sheet for Healthcare Providers:  IncredibleEmployment.be  This test is no t yet approved or cleared by the [place] FDA and  has been authorized for detection and/or diagnosis of SARS-CoV-2 by FDA under an Emergency Use Authorization (EUA). This EUA will remain  in effect (meaning this test can be used) for the duration of the COVID-19 declaration under Section 564(b)(1) of the Act, 21 U.S.C.section 360bbb-3(b)(1), unless the authorization is terminated  or revoked sooner.       Influenza A by PCR NEGATIVE NEGATIVE Final   Influenza B by PCR NEGATIVE NEGATIVE Final    Comment: (NOTE) The Xpert Xpress SARS-CoV-2/FLU/RSV plus assay is intended as an aid in the diagnosis of influenza from Nasopharyngeal swab specimens and should not be  used as a sole basis for treatment. Nasal washings and aspirates are unacceptable for Xpert Xpress SARS-CoV-2/FLU/RSV testing.  Fact Sheet for Patients: EntrepreneurPulse.com.au  Fact Sheet for Healthcare Providers: IncredibleEmployment.be  This test is not yet approved or cleared by the [place] FDA and has been authorized for detection and/or diagnosis of SARS-CoV-2 by FDA under an Emergency Use Authorization (EUA). This EUA will remain in effect (meaning this test can be used) for the duration of the COVID-19 declaration under Section 564(b)(1) of the Act, 21 U.S.C. section 360bbb-3(b)(1), unless the authorization is terminated or revoked.  Performed at [facility], [place] [address], [place], [place] [address]        Radiology Studies: CT HEAD WO CONTRAST  Result Date: 02/19/2021 CLINICAL DATA:  Trauma, status post fall. EXAM: CT HEAD WITHOUT CONTRAST TECHNIQUE: Contiguous axial images were obtained from the base of the skull through the vertex without intravenous contrast. COMPARISON:  Head CT 3 days ago 02/16/2021 FINDINGS: Brain: Unchanged atrophy and chronic small vessel ischemia. No intracranial hemorrhage, mass effect, or midline shift. No hydrocephalus. The basilar cisterns are patent. Tiny left inferior frontal parenchymal calcification is unchanged. No evidence of territorial infarct or acute ischemia. No extra-axial or intracranial fluid collection. Vascular: Atherosclerosis of skullbase vasculature without hyperdense vessel or abnormal calcification. Skull: No fracture or focal lesion. Sinuses/Orbits: No acute findings. Scattered mucous retention cysts in the maxillary sinuses. Bilateral cataract resection. Other: None. IMPRESSION: 1. No acute intracranial abnormality. No skull fracture. 2. Unchanged atrophy and chronic small vessel ischemia. Electronically Signed   By: Keith Rake M.D.   On: 02/19/2021 21:53   CT CERVICAL  SPINE WO CONTRAST  Result Date: 02/19/2021 CLINICAL DATA:  Neck trauma.  Fall. EXAM: CT CERVICAL SPINE WITHOUT CONTRAST TECHNIQUE: Multidetector CT imaging of the cervical spine was performed without intravenous contrast. Multiplanar CT image reconstructions were also generated. COMPARISON:  None. FINDINGS: Alignment: No traumatic subluxation. 3-4 mm anterolisthesis of C7 on T1, 2-3 mm anterolisthesis of T1 on T2, both likely facet mediated. Skull base and vertebrae: No acute fracture. Advanced degenerative change at C1-C2, osteophytes simulating fracture. Moderate pannus. The dens and skull base are intact. Soft tissues and spinal canal: No prevertebral fluid or swelling. No visible canal hematoma. Disc levels: Diffuse degenerative disc disease with disc space narrowing and endplate spurring at all levels, most prominently affecting C5-C6 and C6-C7. There is prominent multilevel facet hypertrophy. Upper chest: No acute or unexpected findings. Other: Carotid calcifications. IMPRESSION: 1. No acute fracture or subluxation of the cervical spine. 2. Advanced multilevel  degenerative disc disease and facet hypertrophy. Electronically Signed   By: Keith Rake M.D.   On: 02/19/2021 21:56   DG Chest Port 1 View  Result Date: 02/19/2021 CLINICAL DATA:  near syncope EXAM: PORTABLE CHEST 1 VIEW.  Patient is rotated. COMPARISON:  Chest x-ray 09/16/2019.  CT angio chest 02/03/2021 FINDINGS: Left chest wall 2 lead pacemaker in similar position. The heart and mediastinal contours are unchanged given rotation. Aortic calcification. Biapical pleural/pulmonary scarring. No focal consolidation. No pulmonary edema. No pleural effusion. No pneumothorax. No acute osseous abnormality. IMPRESSION: No active disease. Electronically Signed   By: Iven Finn M.D.   On: 02/19/2021 21:23   CT Renal Stone Study  Result Date: 02/20/2021 CLINICAL DATA:  Urinary retention.  History of prostate cancer. EXAM: CT ABDOMEN AND PELVIS  WITHOUT CONTRAST TECHNIQUE: Multidetector CT imaging of the abdomen and pelvis was performed following the standard protocol without IV contrast. COMPARISON:  02/03/2021. FINDINGS: Lower chest: The heart is normal in size and scattered coronary artery calcifications are noted. Pacemaker leads are present in the heart. A 4 mm nodule is present in the right lower lobe, axial image 8. Hepatobiliary: The liver is within normal limits. Mild intrahepatic and extrahepatic biliary ductal dilatation is seen which is likely due to patient's post cholecystectomy status. Pancreas: Unremarkable. No pancreatic ductal dilatation or surrounding inflammatory changes. Spleen: Normal in size without focal abnormality. Adrenals/Urinary Tract: There is redemonstration of a right adrenal mass measuring 3.2 cm, unchanged. The left adrenal gland is within normal limits. Scattered hypo and hyperdense structures are present in the kidneys bilaterally. Complex masslike lesions are noted in the kidneys bilaterally measuring 2.6 cm in the mid left kidney and 2.6 cm in the mid right kidney. No renal or ureteral calculus or obstructive uropathy bilaterally. The bladder is unremarkable. Stomach/Bowel: The stomach is nondistended. No bowel obstruction, free air or pneumatosis. A few scattered diverticular noted along the colon without evidence of diverticulitis. Right hemicolectomy changes are noted. Vascular/Lymphatic: Aortic atherosclerosis with ectasia of the distal abdominal aorta. 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 The common iliac artery on the left measures 1.8 cm. No abdominal or pelvic lymphadenopathy. Reproductive: The prostate gland is enlarged and contains multiple radiopaque brachytherapy seeds. Other: No free fluid. A small fat containing umbilical hernia is noted. There is a small right inguinal fat containing hernia. Musculoskeletal: Degenerative changes are present in the  thoracolumbar spine and bilateral hips. A stable sclerotic lesion is noted in the L2 vertebral body on the right, possible bone island. IMPRESSION: 1. No renal or ureteral calculus or obstructive uropathy bilaterally. 2. Multiple hyperdense and hypodense lesions in the kidneys bilaterally. Complex masses are noted in the mid kidneys bilaterally, compatible with history of multifocal renal cell carcinoma and slightly increased in size from the prior exam. 3. Enlarged prostate gland containing brachytherapy seeds. 4. Right lower lobe pulmonary nodule. No follow-up needed if patient is low-risk. Non-contrast chest CT can be considered in 12 months if patient is high-risk. This recommendation follows the consensus statement: Guidelines for Management of Incidental Pulmonary Nodules Detected on CT Images: From the Fleischner Society 2017; Radiology 2017; 284:228-243. 5. Aortic atherosclerosis. 6. Coronary artery calcifications. 7. Remaining chronic findings as described above. Electronically Signed   By: Brett Fairy M.D.   On: 02/20/2021 00:28       LOS: 1 day   [facility] Pager on [email or web address]  02/21/2021, 10:22 AM

## 2021-02-21 NOTE — Progress Notes (Signed)
Patient had an uneventful night. No new complaints. Reports feeling better than when he came in. He was slightly confused at the beginning of shift about what time it was but, was easily redirected. No PRN's required overnight. Family remained at the bedside for the duration of the shift.

## 2021-02-22 LAB — BASIC METABOLIC PANEL
Anion gap: 6 (ref 5–15)
BUN: 26 mg/dL — ABNORMAL HIGH (ref 8–23)
CO2: 20 mmol/L — ABNORMAL LOW (ref 22–32)
Calcium: 8.9 mg/dL (ref 8.9–10.3)
Chloride: 106 mmol/L (ref 98–111)
Creatinine, Ser: 1.28 mg/dL — ABNORMAL HIGH (ref 0.61–1.24)
GFR, Estimated: 53 mL/min — ABNORMAL LOW (ref >60)
Glucose, Bld: 94 mg/dL (ref 70–99)
Potassium: 4.4 mmol/L (ref 3.5–5.1)
Sodium: 132 mmol/L — ABNORMAL LOW (ref 135–145)

## 2021-02-22 NOTE — Discharge Summary (Signed)
[facility]  Physician Discharge Summary   Patient ID: Derrick Long MRN: [id number] DOB/AGE: [birth year] 88 y.o.  Admit date: 02/19/2021 Discharge date: 02/22/2021    PCP: [facility] [place]:  Acute kidney injury Metabolic acidosis Hyponatremia Paroxysmal atrial fibrillation Near syncope secondary to dehydration Essential hypertension Normocytic anemia Metastatic prostate cancer Bilateral renal masses   RECOMMENDATIONS FOR OUTPATIENT FOLLOW UP: Follow-up with medical oncology for his prostate cancer as well as bilateral renal masses Needs to follow-up with his cardiologist regarding low battery in pacemaker    Home Health: None Equipment/Devices: None  CODE STATUS: Full code  DISCHARGE CONDITION: fair  Diet recommendation: As before  INITIAL HISTORY: 88 y.o. male with medical history significant of HTN, PAF, PPM, Prostate CA: s/p seed implant ~20 years ago, recurrent metastatic disease with adrenal met confirmed on biopsy as of March 2022.  Also has Renal masses though am unclear if these are metastatic prostate CA vs RCC.  Havent had these biopsied due to risk of bleed. Recently admitted 12/19-12/20/22 for N/V and abd pain.  Felt to be due to gastritis.  GI symptoms have resolved.  Patient presented with generalized weakness and near syncope.  Pacemaker was interrogated in the emergency department and no arrhythmias were recorded.  Battery was low but functioning.  Noted to have acute kidney injury on blood work.  Hospitalize for further management.   HOSPITAL COURSE:   Acute kidney injury/metabolic acidosis/hyponatremia Baseline renal function normal with creatinine between 1.0-1.2.  Presented with creatinine of 2.3.  BUN was elevated at 52.  He was noted to have low bicarbonate level as well.  Started on fluids.   All of this is likely due to dehydration from recent GI illness along with poor oral intake the last several days.    CT renal study did not show any hydronephrosis.  UA unremarkable. Patient was placed on IV fluids with bicarbonate.  Renal function has improved significantly and almost back to baseline.     Paroxysmal atrial fibrillation Pacemaker was interrogated in the emergency department and no arrhythmias were noted.  Battery was low but pacemaker is functioning normally.  Needs to follow-up with his cardiologist. Continue home medications   Generalized weakness/near syncope Likely due to dehydration.  No focal neurological deficits noted.  No arrhythmias noted on pacemaker interrogation.  Seen by physical therapy.  Ambulated without difficulty.   Essential hypertension Continue amlodipine and carvedilol.   Normocytic anemia Mild drop in hemoglobin is likely dilutional.  No evidence of overt bleeding  Metastatic prostate cancer Followed by medical oncology.  Noted to be on Zytiga Megace Flomax and prednisone chronically.  Bilateral renal masses Thought to be renal cell carcinoma versus metastatic lesion from prostate cancer.  Follow-up with medical oncology.   Patient is stable.  Okay for discharge home today.   PERTINENT LABS:  The results of significant diagnostics from this hospitalization (including imaging, microbiology, ancillary and laboratory) are listed below for reference.    Microbiology: Recent Results (from the past 240 hour(s))  Resp Panel by RT-PCR (Flu A&B, Covid) Nasopharyngeal Swab     Status: None   Collection Time: 02/19/21  8:50 PM   Specimen: Nasopharyngeal Swab; Nasopharyngeal(NP) swabs in vial transport medium  Result Value Ref Range Status   SARS Coronavirus 2 by RT PCR NEGATIVE NEGATIVE Final    Comment: (NOTE) SARS-CoV-2 target nucleic acids are NOT DETECTED.  The SARS-CoV-2 RNA is generally detectable in upper respiratory specimens during the acute phase  of infection. The lowest concentration of SARS-CoV-2 viral copies this assay can detect is 138  copies/mL. A negative result does not preclude SARS-Cov-2 infection and should not be used as the sole basis for treatment or other patient management decisions. A negative result may occur with  improper specimen collection/handling, submission of specimen other than nasopharyngeal swab, presence of viral mutation(s) within the areas targeted by this assay, and inadequate number of viral copies(<138 copies/mL). A negative result must be combined with clinical observations, patient history, and epidemiological information. The expected result is Negative.  Fact Sheet for Patients:  EntrepreneurPulse.com.au  Fact Sheet for Healthcare Providers:  IncredibleEmployment.be  This test is no t yet approved or cleared by the [place] FDA and  has been authorized for detection and/or diagnosis of SARS-CoV-2 by FDA under an Emergency Use Authorization (EUA). This EUA will remain  in effect (meaning this test can be used) for the duration of the COVID-19 declaration under Section 564(b)(1) of the Act, 21 U.S.C.section 360bbb-3(b)(1), unless the authorization is terminated  or revoked sooner.       Influenza A by PCR NEGATIVE NEGATIVE Final   Influenza B by PCR NEGATIVE NEGATIVE Final    Comment: (NOTE) The Xpert Xpress SARS-CoV-2/FLU/RSV plus assay is intended as an aid in the diagnosis of influenza from Nasopharyngeal swab specimens and should not be used as a sole basis for treatment. Nasal washings and aspirates are unacceptable for Xpert Xpress SARS-CoV-2/FLU/RSV testing.  Fact Sheet for Patients: EntrepreneurPulse.com.au  Fact Sheet for Healthcare Providers: IncredibleEmployment.be  This test is not yet approved or cleared by the [place] FDA and has been authorized for detection and/or diagnosis of SARS-CoV-2 by FDA under an Emergency Use Authorization (EUA). This EUA will remain in effect (meaning  this test can be used) for the duration of the COVID-19 declaration under Section 564(b)(1) of the Act, 21 U.S.C. section 360bbb-3(b)(1), unless the authorization is terminated or revoked.  Performed at [facility], [place] [address] [place], [place] [address]      Labs:  COVID-19 Labs   Lab Results  Component Value Date   SARSCOV2NAA NEGATIVE 02/19/2021   [place] NEGATIVE 02/03/2021   [place] NEGATIVE 09/15/2019      Basic Metabolic Panel: Recent Labs  Lab 02/19/21 2128 02/19/21 2131 02/20/21 0337 02/21/21 0121 02/22/21 0200  NA 134* 131* 131* 133* 132*  K 4.8 4.6 3.9 4.0 4.4  CL 108 106 110 107 106  CO2  --  16* 15* 20* 20*  GLUCOSE 112* 111* 172* 104* 94  BUN 52* 60* 51* 35* 26*  CREATININE 2.30* 2.25* 1.98* 1.44* 1.28*  CALCIUM  --  9.3 9.0 8.8* 8.9   Liver Function Tests: Recent Labs  Lab 02/19/21 2131  AST 17  ALT 19  ALKPHOS 58  BILITOT 0.4  PROT 6.9  ALBUMIN 3.1*   Recent Labs  Lab 02/19/21 2131  LIPASE 52*    CBC: Recent Labs  Lab 02/19/21 2128 02/19/21 2131 02/20/21 0337 02/21/21 0121  WBC  --  6.6 6.2 5.9  NEUTROABS  --  4.6  --   --   HGB 12.9* 13.2 12.0* 11.4*  HCT 38.0* 37.5* 35.3* 32.2*  MCV  --  97.2 99.7 95.0  PLT  --  326 281 278      IMAGING STUDIES CT HEAD WO CONTRAST  Result Date: 02/19/2021 CLINICAL DATA:  Trauma, status post fall. EXAM: CT HEAD WITHOUT CONTRAST TECHNIQUE: Contiguous axial images were obtained from the  base of the skull through the vertex without intravenous contrast. COMPARISON:  Head CT 3 days ago 02/16/2021 FINDINGS: Brain: Unchanged atrophy and chronic small vessel ischemia. No intracranial hemorrhage, mass effect, or midline shift. No hydrocephalus. The basilar cisterns are patent. Tiny left inferior frontal parenchymal calcification is unchanged. No evidence of territorial infarct or acute ischemia. No extra-axial or intracranial fluid collection. Vascular: Atherosclerosis of  skullbase vasculature without hyperdense vessel or abnormal calcification. Skull: No fracture or focal lesion. Sinuses/Orbits: No acute findings. Scattered mucous retention cysts in the maxillary sinuses. Bilateral cataract resection. Other: None. IMPRESSION: 1. No acute intracranial abnormality. No skull fracture. 2. Unchanged atrophy and chronic small vessel ischemia. Electronically Signed   By: Keith Rake M.D.   On: 02/19/2021 21:53   CT Head Wo Contrast  Result Date: 02/16/2021 CLINICAL DATA:  Head injury after fall. EXAM: CT HEAD WITHOUT CONTRAST TECHNIQUE: Contiguous axial images were obtained from the base of the skull through the vertex without intravenous contrast. COMPARISON:  Jun 19, 2017. FINDINGS: Brain: Mild chronic ischemic white matter disease is noted. No mass effect or midline shift is noted. Ventricular size is within normal limits. There is no evidence of mass lesion, hemorrhage or acute infarction. Vascular: No hyperdense vessel or unexpected calcification. Skull: Normal. Negative for fracture or focal lesion. Sinuses/Orbits: No acute finding. Other: None. IMPRESSION: No acute intracranial abnormality seen. Electronically Signed   By: Marijo Conception M.D.   On: 02/16/2021 12:31   CT CERVICAL SPINE WO CONTRAST  Result Date: 02/19/2021 CLINICAL DATA:  Neck trauma.  Fall. EXAM: CT CERVICAL SPINE WITHOUT CONTRAST TECHNIQUE: Multidetector CT imaging of the cervical spine was performed without intravenous contrast. Multiplanar CT image reconstructions were also generated. COMPARISON:  None. FINDINGS: Alignment: No traumatic subluxation. 3-4 mm anterolisthesis of C7 on T1, 2-3 mm anterolisthesis of T1 on T2, both likely facet mediated. Skull base and vertebrae: No acute fracture. Advanced degenerative change at C1-C2, osteophytes simulating fracture. Moderate pannus. The dens and skull base are intact. Soft tissues and spinal canal: No prevertebral fluid or swelling. No visible canal  hematoma. Disc levels: Diffuse degenerative disc disease with disc space narrowing and endplate spurring at all levels, most prominently affecting C5-C6 and C6-C7. There is prominent multilevel facet hypertrophy. Upper chest: No acute or unexpected findings. Other: Carotid calcifications. IMPRESSION: 1. No acute fracture or subluxation of the cervical spine. 2. Advanced multilevel degenerative disc disease and facet hypertrophy. Electronically Signed   By: Keith Rake M.D.   On: 02/19/2021 21:56   DG Chest Port 1 View  Result Date: 02/19/2021 CLINICAL DATA:  near syncope EXAM: PORTABLE CHEST 1 VIEW.  Patient is rotated. COMPARISON:  Chest x-ray 09/16/2019.  CT angio chest 02/03/2021 FINDINGS: Left chest wall 2 lead pacemaker in similar position. The heart and mediastinal contours are unchanged given rotation. Aortic calcification. Biapical pleural/pulmonary scarring. No focal consolidation. No pulmonary edema. No pleural effusion. No pneumothorax. No acute osseous abnormality. IMPRESSION: No active disease. Electronically Signed   By: Iven Finn M.D.   On: 02/19/2021 21:23   ECHOCARDIOGRAM COMPLETE  Result Date: 02/05/2021    ECHOCARDIOGRAM REPORT   Patient Name:   Derrick Long Date of Exam: 02/05/2021 Medical Rec #:  [id number]         Height:       73.0 in Accession #:    [id number]        Weight:       185.0 lb Date of Birth:  07-29-1926  BSA:          2.081 m Patient Age:    82 years          BP:           118/65 mmHg Patient Gender: M                 HR:           72 bpm. Exam Location:  [facility] Procedure: 2D Echo, Cardiac Doppler and Color Doppler Indications:    Abnormal ECG  History:        Patient has prior history of Echocardiogram examinations, most                 recent 07/20/2012. Pacemaker, Arrythmias:Atrial Fibrillation,                 Signs/Symptoms:Chest Pain; Risk Factors:Hypertension,                 Dyslipidemia and Former Smoker.  Sonographer:    Wenda Low  Referring Phys: [id number] COURAGE EMOKPAE IMPRESSIONS  1. LV function is vigorous with near cavity obliteration during systole . Left ventricular ejection fraction, by estimation, is 70 to 75%. The left ventricle has hyperdynamic function. The left ventricle has no regional wall motion abnormalities. There is severe left ventricular hypertrophy. Left ventricular diastolic parameters are consistent with Grade I diastolic dysfunction (impaired relaxation). Elevated left atrial pressure.  2. Right ventricular systolic function is normal. The right ventricular size is normal. There is normal pulmonary artery systolic pressure.  3. Left atrial size was mild to moderately dilated.  4. Lead seen in RA. Right atrial size was mildly dilated.  5. Trivial mitral valve regurgitation.  6. AV is thickened, calcified with restricted motion Peak and mean gradients through the valve are 19 and 9 mm Hg respectively. Dimensionless index is 0.53 Overall consistent with mild AS. Marland Kitchen The aortic valve is tricuspid. Aortic valve regurgitation is not visualized.  7. The inferior vena cava is normal in size with greater than 50% respiratory variability, suggesting right atrial pressure of 3 mmHg. FINDINGS  Left Ventricle: LV function is vigorous with near cavity obliteration during systole. Left ventricular ejection fraction, by estimation, is 70 to 75%. The left ventricle has hyperdynamic function. The left ventricle has no regional wall motion abnormalities. The left ventricular internal cavity size was small. There is severe left ventricular hypertrophy. Left ventricular diastolic parameters are consistent with Grade I diastolic dysfunction (impaired relaxation). Elevated left atrial pressure. Right Ventricle: The right ventricular size is normal. Right vetricular wall thickness was not assessed. Right ventricular systolic function is normal. There is normal pulmonary artery systolic pressure. The tricuspid regurgitant velocity is 2.09 m/s,  and with an assumed right atrial pressure of 3 mmHg, the estimated right ventricular systolic pressure is 44.0 mmHg. Left Atrium: Left atrial size was mild to moderately dilated. Right Atrium: Lead seen in RA. Right atrial size was mildly dilated. Pericardium: There is no evidence of pericardial effusion. Mitral Valve: There is mild thickening of the mitral valve leaflet(s). Mild mitral annular calcification. Trivial mitral valve regurgitation. MV peak gradient, 6.5 mmHg. The mean mitral valve gradient is 3.0 mmHg. Tricuspid Valve: The tricuspid valve is normal in structure. Tricuspid valve regurgitation is trivial. Aortic Valve: AV is thickened, calcified with restricted motion Peak and mean gradients through the valve are 19 and 9 mm Hg respectively. Dimensionless index is 0.53 Overall consistent with mild AS. The aortic valve is tricuspid. Aortic  valve regurgitation is not visualized. Aortic valve mean gradient measures 10.2 mmHg. Aortic valve peak gradient measures 19.8 mmHg. Aortic valve area, by VTI measures 1.84 cm. Pulmonic Valve: The pulmonic valve was normal in structure. Pulmonic valve regurgitation is trivial. Aorta: The aortic root is normal in size and structure. Venous: The inferior vena cava is normal in size with greater than 50% respiratory variability, suggesting right atrial pressure of 3 mmHg. IAS/Shunts: No atrial level shunt detected by color flow Doppler.  LEFT VENTRICLE PLAX 2D LVIDd:         3.30 cm   Diastology LVIDs:         2.30 cm   LV e' medial:    3.16 cm/s LV PW:         1.80 cm   LV E/e' medial:  22.0 LV IVS:        1.90 cm   LV e' lateral:   7.05 cm/s LVOT diam:     2.10 cm   LV E/e' lateral: 9.8 LV SV:         85 LV SV Index:   41 LVOT Area:     3.46 cm  RIGHT VENTRICLE RV Basal diam:  4.20 cm RV Mid diam:    3.10 cm RV S prime:     9.41 cm/s TAPSE (M-mode): 2.3 cm LEFT ATRIUM              Index        RIGHT ATRIUM           Index LA diam:        3.20 cm  1.54 cm/m   RA Area:      23.10 cm LA Vol (A2C):   113.0 ml 54.29 ml/m  RA Volume:   70.50 ml  33.87 ml/m LA Vol (A4C):   78.8 ml  37.86 ml/m LA Biplane Vol: 98.8 ml  47.47 ml/m  AORTIC VALVE                     PULMONIC VALVE AV Area (Vmax):    1.90 cm      PV Vmax:       1.00 m/s AV Area (Vmean):   1.92 cm      PV Peak grad:  4.0 mmHg AV Area (VTI):     1.84 cm AV Vmax:           222.75 cm/s AV Vmean:          148.250 cm/s AV VTI:            0.462 m AV Peak Grad:      19.8 mmHg AV Mean Grad:      10.2 mmHg LVOT Vmax:         122.00 cm/s LVOT Vmean:        82.300 cm/s LVOT VTI:          0.245 m LVOT/AV VTI ratio: 0.53  AORTA Ao Root diam: 3.40 cm Ao Asc diam:  3.50 cm MITRAL VALVE                TRICUSPID VALVE MV Area (PHT): 4.10 cm     TR Peak grad:   17.5 mmHg MV Area VTI:   3.31 cm     TR Vmax:        209.00 cm/s MV Peak grad:  6.5 mmHg MV Mean grad:  3.0 mmHg     SHUNTS MV Vmax:  1.27 m/s     Systemic VTI:  0.24 m MV Vmean:      69.6 cm/s    Systemic Diam: 2.10 cm MV Decel Time: 185 msec MV E velocity: 69.40 cm/s MV A velocity: 115.00 cm/s MV E/A ratio:  0.60 Dorris Carnes MD Electronically signed by Dorris Carnes MD Signature Date/Time: 02/05/2021/3:15:32 PM    Final    CT Renal Stone Study  Result Date: 02/20/2021 CLINICAL DATA:  Urinary retention.  History of prostate cancer. EXAM: CT ABDOMEN AND PELVIS WITHOUT CONTRAST TECHNIQUE: Multidetector CT imaging of the abdomen and pelvis was performed following the standard protocol without IV contrast. COMPARISON:  02/03/2021. FINDINGS: Lower chest: The heart is normal in size and scattered coronary artery calcifications are noted. Pacemaker leads are present in the heart. A 4 mm nodule is present in the right lower lobe, axial image 8. Hepatobiliary: The liver is within normal limits. Mild intrahepatic and extrahepatic biliary ductal dilatation is seen which is likely due to patient's post cholecystectomy status. Pancreas: Unremarkable. No pancreatic ductal dilatation or  surrounding inflammatory changes. Spleen: Normal in size without focal abnormality. Adrenals/Urinary Tract: There is redemonstration of a right adrenal mass measuring 3.2 cm, unchanged. The left adrenal gland is within normal limits. Scattered hypo and hyperdense structures are present in the kidneys bilaterally. Complex masslike lesions are noted in the kidneys bilaterally measuring 2.6 cm in the mid left kidney and 2.6 cm in the mid right kidney. No renal or ureteral calculus or obstructive uropathy bilaterally. The bladder is unremarkable. Stomach/Bowel: The stomach is nondistended. No bowel obstruction, free air or pneumatosis. A few scattered diverticular noted along the colon without evidence of diverticulitis. Right hemicolectomy changes are noted. Vascular/Lymphatic: Aortic atherosclerosis with ectasia of the distal abdominal aorta. 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 The common iliac artery on the left measures 1.8 cm. No abdominal or pelvic lymphadenopathy. Reproductive: The prostate gland is enlarged and contains multiple radiopaque brachytherapy seeds. Other: No free fluid. A small fat containing umbilical hernia is noted. There is a small right inguinal fat containing hernia. Musculoskeletal: Degenerative changes are present in the thoracolumbar spine and bilateral hips. A stable sclerotic lesion is noted in the L2 vertebral body on the right, possible bone island. IMPRESSION: 1. No renal or ureteral calculus or obstructive uropathy bilaterally. 2. Multiple hyperdense and hypodense lesions in the kidneys bilaterally. Complex masses are noted in the mid kidneys bilaterally, compatible with history of multifocal renal cell carcinoma and slightly increased in size from the prior exam. 3. Enlarged prostate gland containing brachytherapy seeds. 4. Right lower lobe pulmonary nodule. No follow-up needed if patient is low-risk. Non-contrast chest CT can be  considered in 12 months if patient is high-risk. This recommendation follows the consensus statement: Guidelines for Management of Incidental Pulmonary Nodules Detected on CT Images: From the Fleischner Society 2017; Radiology 2017; 284:228-243. 5. Aortic atherosclerosis. 6. Coronary artery calcifications. 7. Remaining chronic findings as described above. Electronically Signed   By: Brett Fairy M.D.   On: 02/20/2021 00:28   CT Angio Chest/Abd/Pel for Dissection W and/or Wo Contrast  Result Date: 02/03/2021 CLINICAL DATA:  Right flank pain.  Acute aortic syndrome suspected. EXAM: CT ANGIOGRAPHY CHEST, ABDOMEN AND PELVIS TECHNIQUE: Non-contrast CT of the chest was initially obtained. Multidetector CT imaging through the chest, abdomen and pelvis was performed using the standard protocol during bolus administration of intravenous contrast. Multiplanar reconstructed images and MIPs were obtained and  reviewed to evaluate the vascular anatomy. CONTRAST:  172m OMNIPAQUE IOHEXOL 350 MG/ML SOLN COMPARISON:  Chest abdomen pelvis CT 06/12/2020. Abdomen/pelvis CT 10/25/2020. FINDINGS: CTA CHEST FINDINGS Cardiovascular: Pre contrast imaging shows no hyperdense crescent in the wall of the thoracic aorta to suggest acute intramural hematoma. Ascending thoracic aorta measures 4.5 cm diameter stable since 06/12/2020. No mural thickening or irregularity in the thoracic aorta. There is no dissection of the thoracic aorta. Branch vessel anatomy from the aortic arch opacifies normally. Left permanent pacemaker noted. Mediastinum/Nodes: No mediastinal lymphadenopathy. There is no hilar lymphadenopathy. The esophagus has normal imaging features. There is no axillary lymphadenopathy. Lungs/Pleura: No suspicious pulmonary nodule or mass. 4 mm peripheral left lower lobe pulmonary nodule identified previously is stable a on 88/7 today. No focal airspace consolidation. There is no evidence of pleural effusion. Musculoskeletal: No  worrisome lytic or sclerotic osseous abnormality. Review of the MIP images confirms the above findings. CTA ABDOMEN AND PELVIS FINDINGS VASCULAR Aorta: Normal caliber aorta without aneurysm, dissection, vasculitis or significant stenosis. Celiac: Calcific ostial plaque. Patent without evidence of aneurysm, dissection, vasculitis or significant stenosis. SMA: Patent without evidence of aneurysm, dissection, vasculitis or significant stenosis. Renals: Both renal arteries are patent without evidence of aneurysm, dissection, vasculitis, fibromuscular dysplasia or significant stenosis. IMA: Patent without evidence of aneurysm, dissection, vasculitis or significant stenosis. Inflow: Patent without evidence of aneurysm, dissection, vasculitis or significant stenosis. Right common iliac artery measures 1.9 cm diameter. Veins: No obvious venous abnormality within the limitations of this arterial phase study. Review of the MIP images confirms the above findings. NON-VASCULAR Hepatobiliary: Scattered tiny foci of arterial phase hyperenhancement are identified in both hepatic lobes, stable since 06/12/2020, likely benign and potentially vascular malformation or flash filling hemangiomas. Gallbladder surgically absent. No intrahepatic or extrahepatic biliary dilation. Pancreas: No focal mass lesion. No dilatation of the main duct. No intraparenchymal cyst. No peripancreatic edema. Spleen: No splenomegaly. No focal mass lesion. Adrenals/Urinary Tract: Stable 3.2 cm right adrenal mass. No left adrenal nodule or mass. Multiple bilateral renal lesions again noted. Previous CT of 09/16/2019 characterized enhancing lesions in both kidneys. 2.3 cm enhancing lesion lateral interpolar right kidney was 2.2 cm on that study. 2.2 cm lesion in the lower pole right kidney shows apparent enhancement, also stable since 09/16/2019. Right renal lesions are stable. No evidence for hydroureter. The urinary bladder appears normal for the degree of  distention. Stomach/Bowel: Stomach is unremarkable. No gastric wall thickening. No evidence of outlet obstruction. Duodenum is normally positioned as is the ligament of Treitz. No small bowel wall thickening. No small bowel dilatation. Status post right hemicolectomy. No gross colonic mass. No colonic wall thickening. Lymphatic: No abdominal lymphadenopathy No pelvic sidewall lymphadenopathy. Reproductive: Brachytherapy seeds noted in the prostate gland. Other: No intraperitoneal free fluid. Musculoskeletal: No worrisome lytic or sclerotic osseous abnormality. Degenerative changes noted in both hips, right greater than left. Review of the MIP images confirms the above findings. IMPRESSION: 1. No evidence for acute intramural hematoma or dissection of the thoracic aorta. There is no dissection of the thoracoabdominal aorta. 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 2. No acute findings in the chest, abdomen or pelvis. Specifically, no findings to explain the patient's history of right-sided flank pain. 3. Multiple bilateral renal lesions again noted with enhancing lesions in both kidneys as characterized previously. No substantial interval change. 4. Scattered foci of arterial phase hyperenhancement in the liver are unchanged. Continued  attention on follow-up recommended. 5. Stable 3.2 cm right adrenal mass. 6. Status post right hemicolectomy. 7. Aortic Atherosclerosis (ICD10-I70.0). Electronically Signed   By: Misty Stanley M.D.   On: 02/03/2021 06:53   US Abdomen Limited RUQ (LIVER/GB)  Result Date: 02/04/2021 CLINICAL DATA:  Abnormal LFTs EXAM: ULTRASOUND ABDOMEN LIMITED RIGHT UPPER QUADRANT COMPARISON:  CT 10/25/2020 FINDINGS: Gallbladder: Prior cholecystectomy Common bile duct: Diameter: Dilated, measuring 12 mm. This is stable when compared to prior CT, likely related to patient's age and post cholecystectomy state. Liver:  No focal lesion identified. Within normal limits in parenchymal echogenicity. Portal vein is patent on color Doppler imaging with normal direction of blood flow towards the liver. Other: None. IMPRESSION: Prior cholecystectomy. Prominent common bile duct is stable likely related to age and post cholecystectomy state. No acute findings. Electronically Signed   By: Rolm Baptise M.D.   On: 02/04/2021 09:15    DISCHARGE EXAMINATION: Vitals:   02/21/21 0903 02/21/21 2222 02/22/21 0616 02/22/21 0751  BP: (!) 142/49 (!) 157/57 136/65 (!) 135/56  Pulse: (!) 56 63 (!) 54 (!) 56  Resp: _0 Temp: 97.6 F (36.4 C) 98.2 F (36.8 C) 98.4 F (36.9 C) 98.1 F (36.7 C)  TempSrc: Oral Oral Oral Oral  SpO2: 100% 100% 98% 99%   General appearance: Awake alert.  In no distress Resp: Clear to auscultation bilaterally.  Normal effort Cardio: S1-S2 is normal regular.  No S3-S4.  No rubs murmurs or bruit GI: Abdomen is soft.  Nontender nondistended.  Bowel sounds are present normal.  No masses organomegaly    DISPOSITION: Home with son  Discharge Instructions     Call MD for:  difficulty breathing, headache or visual disturbances   Complete by: As directed    Call MD for:  extreme fatigue   Complete by: As directed    Call MD for:  persistant dizziness or light-headedness   Complete by: As directed    Call MD for:  persistant nausea and vomiting   Complete by: As directed    Call MD for:  severe uncontrolled pain   Complete by: As directed    Call MD for:  temperature >100.4   Complete by: As directed    Diet - low sodium heart healthy   Complete by: As directed    Discharge instructions   Complete by: As directed    Please stay well-hydrated for the next few days.  Please be sure to follow-up with your medical oncologist.  Take your medications as prescribed.  You were cared for by a hospitalist during your hospital stay. If you have any questions about your discharge medications or the  care you received while you were in the hospital after you are discharged, you can call the unit and asked to speak with the hospitalist on call if the hospitalist that took care of you is not available. Once you are discharged, your primary care physician will handle any further medical issues. Please note that NO REFILLS for any discharge medications will be authorized once you are discharged, as it is imperative that you return to your primary care physician (or establish a relationship with a primary care physician if you do not have one) for your aftercare needs so that they can reassess your need for medications and monitor your lab values. If you do not have a primary care physician, you can call [phone] for a physician referral.   Increase activity slowly   Complete by:  As directed           Allergies as of 02/22/2021   No Known Allergies      Medication List     TAKE these medications    abiraterone acetate 250 MG tablet Commonly known as: ZYTIGA Take 2 tablets (500 mg total) by mouth daily. Take on an empty stomach 1 hour before or 2 hours after a meal   acetaminophen 650 MG CR tablet Commonly known as: TYLENOL Take 1,300 mg by mouth every 8 (eight) hours as needed for pain.   amLODipine 10 MG tablet Commonly known as: NORVASC Take 10 mg by mouth daily.   atorvastatin 20 MG tablet Commonly known as: LIPITOR Take 1 tablet (20 mg total) by mouth daily at 6 PM. What changed: when to take this   carvedilol 25 MG tablet Commonly known as: COREG Take 50 mg by mouth 2 (two) times daily with a meal.   Cholecalciferol 25 MCG (1000 UT) tablet Take 1,000 Units by mouth daily.   diclofenac sodium 1 % Gel Commonly known as: VOLTAREN Apply 2 g topically 4 (four) times daily. What changed:  when to take this reasons to take this   Ensure Take 237 mLs by mouth daily as needed (nutrition). Vanilla or chocolate   ferrous sulfate 324 MG Tbec Take 324 mg by mouth 2 (two)  times daily.   fluticasone 50 MCG/ACT nasal spray Commonly known as: FLONASE Place 2 sprays into both nostrils daily. What changed:  when to take this reasons to take this   HYDROcodone-acetaminophen 5-325 MG tablet Commonly known as: NORCO/VICODIN Take 1 tablet by mouth every 6 (six) hours as needed for moderate pain.   megestrol 400 MG/10ML suspension Commonly known as: MEGACE Take 10 mLs (400 mg total) by mouth 2 (two) times daily.   ondansetron 8 MG disintegrating tablet Commonly known as: ZOFRAN-ODT Take 1 tablet (8 mg total) by mouth every 8 (eight) hours as needed for nausea or vomiting.   pantoprazole 40 MG tablet Commonly known as: Protonix Take 1 tablet (40 mg total) by mouth 2 (two) times daily.   potassium chloride SA 20 MEQ tablet Commonly known as: KLOR-CON M Take 20 mEq by mouth daily.   predniSONE 5 MG tablet Commonly known as: DELTASONE Take 1 tablet (5 mg total) by mouth daily with breakfast.   rivaroxaban 20 MG Tabs tablet Commonly known as: XARELTO Take 1 tablet (20 mg total) by mouth daily with supper.   silodosin 8 MG Caps capsule Commonly known as: Rapaflo Take 1 capsule (8 mg total) by mouth at bedtime. What changed: when to take this          [place], [facility]. Schedule an appointment as soon as possible for a visit in 1 week(s).   Specialty: General Practice Contact information: [place] [address] [phone]         Derek Jack, MD Follow up.   Specialty: Hematology Contact information: [place] [address] [phone]                 TOTAL DISCHARGE TIME: 60 minutes  [facility] Pager on [email or web address]  02/23/2021, 1:05 PM

## 2021-02-22 NOTE — Progress Notes (Signed)
Discharge instructions, RX's and follow up appts explained and provided to patient and son verbalized understanding. Patients home medications picked up from pharmacy and returned to patient. Patient left floor via wheelchair accompanied by staff no c/o pain or shortness of breath at d/c.  Evanthia Maund, Tivis Ringer, RN

## 2021-02-24 ENCOUNTER — Ambulatory Visit (HOSPITAL_COMMUNITY): Payer: No Typology Code available for payment source

## 2021-02-24 ENCOUNTER — Inpatient Hospital Stay (HOSPITAL_COMMUNITY): Payer: Medicare Other

## 2021-02-24 ENCOUNTER — Ambulatory Visit (HOSPITAL_COMMUNITY): Payer: No Typology Code available for payment source | Admitting: Hematology

## 2021-02-26 ENCOUNTER — Other Ambulatory Visit (HOSPITAL_COMMUNITY): Payer: Self-pay

## 2021-02-26 ENCOUNTER — Other Ambulatory Visit: Payer: Self-pay

## 2021-02-26 ENCOUNTER — Inpatient Hospital Stay (HOSPITAL_COMMUNITY): Payer: Medicare Other

## 2021-02-26 ENCOUNTER — Encounter (HOSPITAL_COMMUNITY): Payer: Self-pay | Admitting: Hematology

## 2021-02-26 ENCOUNTER — Inpatient Hospital Stay (HOSPITAL_BASED_OUTPATIENT_CLINIC_OR_DEPARTMENT_OTHER): Payer: Medicare Other | Admitting: Hematology

## 2021-02-26 ENCOUNTER — Other Ambulatory Visit (HOSPITAL_COMMUNITY): Payer: Self-pay | Admitting: *Deleted

## 2021-02-26 ENCOUNTER — Inpatient Hospital Stay (HOSPITAL_COMMUNITY): Payer: Medicare Other | Attending: Hematology

## 2021-02-26 VITALS — BP 126/63 | HR 82 | Temp 98.2°F | Resp 18 | Ht 73.62 in | Wt 179.0 lb

## 2021-02-26 DIAGNOSIS — C61 Malignant neoplasm of prostate: Secondary | ICD-10-CM

## 2021-02-26 DIAGNOSIS — D508 Other iron deficiency anemias: Secondary | ICD-10-CM

## 2021-02-26 DIAGNOSIS — E278 Other specified disorders of adrenal gland: Secondary | ICD-10-CM

## 2021-02-26 DIAGNOSIS — C641 Malignant neoplasm of right kidney, except renal pelvis: Secondary | ICD-10-CM

## 2021-02-26 DIAGNOSIS — C7971 Secondary malignant neoplasm of right adrenal gland: Secondary | ICD-10-CM | POA: Insufficient documentation

## 2021-02-26 DIAGNOSIS — C642 Malignant neoplasm of left kidney, except renal pelvis: Secondary | ICD-10-CM

## 2021-02-26 DIAGNOSIS — Z5111 Encounter for antineoplastic chemotherapy: Secondary | ICD-10-CM | POA: Diagnosis present

## 2021-02-26 LAB — MAGNESIUM: Magnesium: 1.7 mg/dL (ref 1.7–2.4)

## 2021-02-26 LAB — CBC WITH DIFFERENTIAL/PLATELET
Abs Immature Granulocytes: 0.03 10*3/uL (ref 0.00–0.07)
Basophils Absolute: 0 10*3/uL (ref 0.0–0.1)
Basophils Relative: 0 %
Eosinophils Absolute: 0.1 10*3/uL (ref 0.0–0.5)
Eosinophils Relative: 1 %
HCT: 37.6 % — ABNORMAL LOW (ref 39.0–52.0)
Hemoglobin: 12.5 g/dL — ABNORMAL LOW (ref 13.0–17.0)
Immature Granulocytes: 1 %
Lymphocytes Relative: 28 %
Lymphs Abs: 1.7 10*3/uL (ref 0.7–4.0)
MCH: 33.1 pg (ref 26.0–34.0)
MCHC: 33.2 g/dL (ref 30.0–36.0)
MCV: 99.5 fL (ref 80.0–100.0)
Monocytes Absolute: 0.7 10*3/uL (ref 0.1–1.0)
Monocytes Relative: 11 %
Neutro Abs: 3.7 10*3/uL (ref 1.7–7.7)
Neutrophils Relative %: 59 %
Platelets: 212 10*3/uL (ref 150–400)
RBC: 3.78 MIL/uL — ABNORMAL LOW (ref 4.22–5.81)
RDW: 13.2 % (ref 11.5–15.5)
WBC: 6.2 10*3/uL (ref 4.0–10.5)
nRBC: 0 % (ref 0.0–0.2)

## 2021-02-26 LAB — COMPREHENSIVE METABOLIC PANEL
ALT: 19 U/L (ref 0–44)
AST: 18 U/L (ref 15–41)
Albumin: 3.4 g/dL — ABNORMAL LOW (ref 3.5–5.0)
Alkaline Phosphatase: 46 U/L (ref 38–126)
Anion gap: 7 (ref 5–15)
BUN: 35 mg/dL — ABNORMAL HIGH (ref 8–23)
CO2: 22 mmol/L (ref 22–32)
Calcium: 9.3 mg/dL (ref 8.9–10.3)
Chloride: 106 mmol/L (ref 98–111)
Creatinine, Ser: 1.69 mg/dL — ABNORMAL HIGH (ref 0.61–1.24)
GFR, Estimated: 38 mL/min — ABNORMAL LOW (ref >60)
Glucose, Bld: 88 mg/dL (ref 70–99)
Potassium: 4.3 mmol/L (ref 3.5–5.1)
Sodium: 135 mmol/L (ref 135–145)
Total Bilirubin: 0.6 mg/dL (ref 0.3–1.2)
Total Protein: 7.1 g/dL (ref 6.5–8.1)

## 2021-02-26 MED ORDER — TRAMADOL HCL 50 MG PO TABS: 50.0000 mg | ORAL_TABLET | Freq: Two times a day (BID) | ORAL | 0 refills | Status: DC

## 2021-02-26 MED ORDER — LEUPROLIDE ACETATE (6 MONTH) 45 MG ~~LOC~~ KIT
45.0000 mg | PACK | Freq: Once | SUBCUTANEOUS | Status: AC
  Administered 2021-02-26: 45 mg via SUBCUTANEOUS
  Filled 2021-02-26: qty 45

## 2021-02-26 MED ORDER — ABIRATERONE ACETATE 250 MG PO TABS
500.0000 mg | ORAL_TABLET | Freq: Every day | ORAL | 3 refills | Status: DC
  Filled 2021-02-26 – 2021-03-18 (×2): qty 120, 60d supply, fill #0
  Filled 2021-05-05: qty 120, 60d supply, fill #1

## 2021-02-26 NOTE — Progress Notes (Signed)
Patient is taking Zytiga as prescribed.  He has not missed any doses and reports no side effects at this time.   

## 2021-02-26 NOTE — Progress Notes (Signed)
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    CANCER STAGING:  Cancer Staging  Prostate cancer [place]) Staging form: Prostate, AJCC 8th Edition - Clinical stage from 07/10/2020: Stage IVB (cTX, cNX, pM1c, PSA: 10.2) - Unsigned   INTERVAL HISTORY:  [name], a 88 y.o. 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 Derrick Long was last seen on 01/15/2021.   Today he reports feeling well. He reports increased fatigue and SOB, and he has had 1 fall 2 weeks ago. He continues to have severe back pain. He reports diarrhea. He has lost 10 lbs since 11/30. His appetite has improved.   REVIEW OF SYSTEMS:  Review of Systems  Constitutional:  Positive for fatigue and unexpected weight change (-10 lbs). Negative for appetite change.  Respiratory:  Positive for shortness of breath.   Gastrointestinal:  Positive for diarrhea.  Musculoskeletal:  Positive for back pain (5/10).  All other systems reviewed and are negative.  PAST MEDICAL/SURGICAL HISTORY:  Past Medical History:  Diagnosis Date   Acid reflux    Anemia    Colon cancer ([place])    per patient, colon cancer more than 10 years ago s/p right hemicolectomy   Ex-cigarette smoker    Gout    Hyperlipemia    Hypertension    Paroxysmal atrial fibrillation ([facility]) 2014   Prostate cancer ([place]) 08/08/2013   SEEDS 20 YEARS AGO   Prostate disease    Past Surgical History:  Procedure Laterality Date   BIOPSY  02/05/2021   Procedure: BIOPSY;  Surgeon: Montez Morita,  Quillian Quince, MD;  [facility]: [facility];  Service: Gastroenterology;;   COLON SURGERY     Right hemicolectomy in the setting of colon cancer, per patient   ESOPHAGOGASTRODUODENOSCOPY (EGD) WITH PROPOFOL N/A 02/05/2021   Procedure: ESOPHAGOGASTRODUODENOSCOPY (EGD) WITH PROPOFOL;  Surgeon: Harvel Quale, MD;  Location: [facility];  Service: Gastroenterology;  Laterality: N/A;   LEFT HEART CATHETERIZATION WITH CORONARY ANGIOGRAM N/A 07/21/2012   Procedure: LEFT HEART CATHETERIZATION WITH CORONARY ANGIOGRAM;  Surgeon: Troy Sine, MD;  Location: [facility] CATH LAB;  Service: Cardiovascular;  Laterality: N/A;   PACEMAKER INSERTION     PERMANENT PACEMAKER INSERTION N/A 07/22/2012   Procedure: PERMANENT PACEMAKER INSERTION;  Surgeon: Sanda Klein, MD;  Location: [facility] CATH LAB;  Service: Cardiovascular;  Laterality: N/A;   PROSTATE SURGERY      SOCIAL HISTORY:  Social History   Socioeconomic History   Marital status: Married    Spouse name: Not on file   Number of children: Not on file   Years of education: Not on file   Highest education level: Not on file  Occupational History   Not on file  Tobacco Use   Smoking status: Former    Types: Cigarettes    Quit date: 07/20/2002    Years since quitting: 18.6   Smokeless tobacco: Never  Vaping Use   Vaping Use: Never used  Substance and Sexual Activity   Alcohol use: No   Drug use: No   Sexual activity:  Not on file  Other Topics Concern   Not on file  Social History Narrative   Not on file   Social Determinants of Health   Financial Resource Strain: Low Risk    Difficulty of Paying Living Expenses: Not hard at all  Food Insecurity: No Food Insecurity   Worried About [place] in the Last Year: Never true   [place] in the Last Year: Never true  Transportation Needs: No Transportation Needs   Lack of Transportation (Medical): No   Lack of Transportation (Non-Medical): No  Physical Activity: Insufficiently  Active   Days of Exercise per Week: 3 days   Minutes of Exercise per Session: 30 min  Stress: No Stress Concern Present   Feeling of Stress : Not at all  Social Connections: Moderately Integrated   Frequency of Communication with Friends and Family: More than three times a week   Frequency of Social Gatherings with Friends and Family: More than three times a week   Attends Religious Services: More than 4 times per year   Active Member of Clubs or Organizations: No   Attends Archivist Meetings: 1 to 4 times per year   Marital Status: Widowed  Human resources officer Violence: Not At Risk   Fear of Current or Ex-Partner: No   Emotionally Abused: No   Physically Abused: No   Sexually Abused: No    FAMILY HISTORY:  Family History  Problem Relation Age of Onset   Cancer Neg Hx     CURRENT MEDICATIONS:  Current Outpatient Medications  Medication Sig Dispense Refill   abiraterone acetate (ZYTIGA) 250 MG tablet Take 2 tablets (500 mg total) by mouth daily. Take on an empty stomach 1 hour before or 2 hours after a meal 120 tablet 1   amLODipine (NORVASC) 10 MG tablet Take 10 mg by mouth daily.     atorvastatin (LIPITOR) 20 MG tablet Take 1 tablet (20 mg total) by mouth daily at 6 PM. (Patient taking differently: Take 20 mg by mouth every evening.) 30 tablet 5   carvedilol (COREG) 25 MG tablet Take 50 mg by mouth 2 (two) times daily with a meal.     Cholecalciferol 25 MCG (1000 UT) tablet Take 1,000 Units by mouth daily.     diclofenac sodium (VOLTAREN) 1 % GEL Apply 2 g topically 4 (four) times daily. (Patient taking differently: Apply 2 g topically 4 (four) times daily as needed (pain).) 100 g 0   ferrous sulfate 324 MG TBEC Take 324 mg by mouth 2 (two) times daily.     fluticasone (FLONASE) 50 MCG/ACT nasal spray Place 2 sprays into both nostrils daily. (Patient taking differently: Place 2 sprays into both nostrils daily as needed for allergies.) 16 g 2   megestrol (MEGACE) 400  MG/10ML suspension Take 10 mLs (400 mg total) by mouth 2 (two) times daily. 480 mL 2   pantoprazole (PROTONIX) 40 MG tablet Take 1 tablet (40 mg total) by mouth 2 (two) times daily. 60 tablet 2   potassium chloride SA (KLOR-CON) 20 MEQ tablet Take 20 mEq by mouth daily.     predniSONE (DELTASONE) 5 MG tablet Take 1 tablet (5 mg total) by mouth daily with breakfast. 30 tablet 6   rivaroxaban (XARELTO) 20 MG TABS tablet Take 1 tablet (20 mg total) by mouth daily with supper. 20 tablet 0   silodosin (RAPAFLO) 8 MG CAPS capsule Take 1 capsule (8 mg total) by mouth at bedtime. (Patient  taking differently: Take 8 mg by mouth every evening.) 30 capsule 0   acetaminophen (TYLENOL) 650 MG CR tablet Take 1,300 mg by mouth every 8 (eight) hours as needed for pain. (Patient not taking: Reported on 02/26/2021)     Ensure (ENSURE) Take 237 mLs by mouth daily as needed (nutrition). Vanilla or chocolate (Patient not taking: Reported on 02/26/2021)     ondansetron (ZOFRAN-ODT) 8 MG disintegrating tablet Take 1 tablet (8 mg total) by mouth every 8 (eight) hours as needed for nausea or vomiting. (Patient not taking: Reported on 02/26/2021) 20 tablet 0   No current facility-administered medications for this visit.   Facility-Administered Medications Ordered in Other Visits  Medication Dose Route Frequency Provider Last Rate Last Admin   leuprolide (6 Month) (ELIGARD) injection 45 mg  45 mg Subcutaneous Once Derek Jack, MD        ALLERGIES:  No Known Allergies  PHYSICAL EXAM:  Performance status (ECOG): 1 - Symptomatic but completely ambulatory  Vitals:   02/26/21 0926  BP: 126/63  Pulse: 82  Resp: 18  Temp: 98.2 F (36.8 C)  SpO2: 100%   Wt Readings from Last 3 Encounters:  02/26/21 179 lb (81.2 kg)  02/16/21 202 lb (91.6 kg)  02/04/21 184 lb 15.5 oz (83.9 kg)   Physical Exam Vitals reviewed.  Constitutional:      Appearance: Normal appearance.     Comments: In wheelchair   Cardiovascular:     Rate and Rhythm: Normal rate and regular rhythm.     Pulses: Normal pulses.     Heart sounds: Normal heart sounds.  Pulmonary:     Effort: Pulmonary effort is normal.     Breath sounds: Normal breath sounds.  Musculoskeletal:     Right lower leg: No edema.     Left lower leg: No edema.  Neurological:     General: No focal deficit present.     Mental Status: He is alert and oriented to person, place, and time.  Psychiatric:        Mood and Affect: Mood normal.        Behavior: Behavior normal.     LABORATORY DATA:  I have reviewed the labs as listed.  CBC Latest Ref Rng & Units 02/26/2021 02/21/2021 02/20/2021  WBC 4.0 - 10.5 K/uL 6.2 5.9 6.2  Hemoglobin 13.0 - 17.0 g/dL 12.5(L) 11.4(L) 12.0(L)  Hematocrit 39.0 - 52.0 % 37.6(L) 32.2(L) 35.3(L)  Platelets 150 - 400 K/uL 212 278 281   CMP Latest Ref Rng & Units 02/26/2021 02/22/2021 02/21/2021  Glucose 70 - 99 mg/dL 88 94 104(H)  BUN 8 - 23 mg/dL 35(H) 26(H) 35(H)  Creatinine 0.61 - 1.24 mg/dL 1.69(H) 1.28(H) 1.44(H)  Sodium 135 - 145 mmol/L 135 132(L) 133(L)  Potassium 3.5 - 5.1 mmol/L 4.3 4.4 4.0  Chloride 98 - 111 mmol/L 106 106 107  CO2 22 - 32 mmol/L 22 20(L) 20(L)  Calcium 8.9 - 10.3 mg/dL 9.3 8.9 8.8(L)  Total Protein 6.5 - 8.1 g/dL 7.1 - -  Total Bilirubin 0.3 - 1.2 mg/dL 0.6 - -  Alkaline Phos 38 - 126 U/L 46 - -  AST 15 - 41 U/L 18 - -  ALT 0 - 44 U/L 19 - -    DIAGNOSTIC IMAGING:  I have independently reviewed the scans and discussed with the patient. CT HEAD WO CONTRAST  Result Date: 02/19/2021 CLINICAL DATA:  Trauma, status post fall. EXAM: CT HEAD WITHOUT CONTRAST TECHNIQUE: Contiguous axial images were obtained  from the base of the skull through the vertex without intravenous contrast. COMPARISON:  Head CT 3 days ago 02/16/2021 FINDINGS: Brain: Unchanged atrophy and chronic small vessel ischemia. No intracranial hemorrhage, mass effect, or midline shift. No hydrocephalus. The basilar cisterns are  patent. Tiny left inferior frontal parenchymal calcification is unchanged. No evidence of territorial infarct or acute ischemia. No extra-axial or intracranial fluid collection. Vascular: Atherosclerosis of skullbase vasculature without hyperdense vessel or abnormal calcification. Skull: No fracture or focal lesion. Sinuses/Orbits: No acute findings. Scattered mucous retention cysts in the maxillary sinuses. Bilateral cataract resection. Other: None. IMPRESSION: 1. No acute intracranial abnormality. No skull fracture. 2. Unchanged atrophy and chronic small vessel ischemia. Electronically Signed   By: Keith Rake M.D.   On: 02/19/2021 21:53   CT Head Wo Contrast  Result Date: 02/16/2021 CLINICAL DATA:  Head injury after fall. EXAM: CT HEAD WITHOUT CONTRAST TECHNIQUE: Contiguous axial images were obtained from the base of the skull through the vertex without intravenous contrast. COMPARISON:  Jun 19, 2017. FINDINGS: Brain: Mild chronic ischemic white matter disease is noted. No mass effect or midline shift is noted. Ventricular size is within normal limits. There is no evidence of mass lesion, hemorrhage or acute infarction. Vascular: No hyperdense vessel or unexpected calcification. Skull: Normal. Negative for fracture or focal lesion. Sinuses/Orbits: No acute finding. Other: None. IMPRESSION: No acute intracranial abnormality seen. Electronically Signed   By: Marijo Conception M.D.   On: 02/16/2021 12:31   CT CERVICAL SPINE WO CONTRAST  Result Date: 02/19/2021 CLINICAL DATA:  Neck trauma.  Fall. EXAM: CT CERVICAL SPINE WITHOUT CONTRAST TECHNIQUE: Multidetector CT imaging of the cervical spine was performed without intravenous contrast. Multiplanar CT image reconstructions were also generated. COMPARISON:  None. FINDINGS: Alignment: No traumatic subluxation. 3-4 mm anterolisthesis of C7 on T1, 2-3 mm anterolisthesis of T1 on T2, both likely facet mediated. Skull base and vertebrae: No acute fracture. Advanced  degenerative change at C1-C2, osteophytes simulating fracture. Moderate pannus. The dens and skull base are intact. Soft tissues and spinal canal: No prevertebral fluid or swelling. No visible canal hematoma. Disc levels: Diffuse degenerative disc disease with disc space narrowing and endplate spurring at all levels, most prominently affecting C5-C6 and C6-C7. There is prominent multilevel facet hypertrophy. Upper chest: No acute or unexpected findings. Other: Carotid calcifications. IMPRESSION: 1. No acute fracture or subluxation of the cervical spine. 2. Advanced multilevel degenerative disc disease and facet hypertrophy. Electronically Signed   By: Keith Rake M.D.   On: 02/19/2021 21:56   DG Chest Port 1 View  Result Date: 02/19/2021 CLINICAL DATA:  near syncope EXAM: PORTABLE CHEST 1 VIEW.  Patient is rotated. COMPARISON:  Chest x-ray 09/16/2019.  CT angio chest 02/03/2021 FINDINGS: Left chest wall 2 lead pacemaker in similar position. The heart and mediastinal contours are unchanged given rotation. Aortic calcification. Biapical pleural/pulmonary scarring. No focal consolidation. No pulmonary edema. No pleural effusion. No pneumothorax. No acute osseous abnormality. IMPRESSION: No active disease. Electronically Signed   By: Iven Finn M.D.   On: 02/19/2021 21:23   ECHOCARDIOGRAM COMPLETE  Result Date: 02/05/2021    ECHOCARDIOGRAM REPORT   Patient Name:   WESTLEE DEVITA Date of Exam: 02/05/2021 Medical Rec #:  [id number]         Height:       73.0 in Accession #:    [id number]        Weight:       185.0 lb Date of Birth:  19-Jan-1927          BSA:          2.081 m Patient Age:    81 years          BP:           118/65 mmHg Patient Gender: M                 HR:           72 bpm. Exam Location:  [facility] Procedure: 2D Echo, Cardiac Doppler and Color Doppler Indications:    Abnormal ECG  History:        Patient has prior history of Echocardiogram examinations, most                 recent  07/20/2012. Pacemaker, Arrythmias:Atrial Fibrillation,                 Signs/Symptoms:Chest Pain; Risk Factors:Hypertension,                 Dyslipidemia and Former Smoker.  Sonographer:    Wenda Low Referring Phys: [id number] COURAGE EMOKPAE IMPRESSIONS  1. LV function is vigorous with near cavity obliteration during systole . Left ventricular ejection fraction, by estimation, is 70 to 75%. The left ventricle has hyperdynamic function. The left ventricle has no regional wall motion abnormalities. There is severe left ventricular hypertrophy. Left ventricular diastolic parameters are consistent with Grade I diastolic dysfunction (impaired relaxation). Elevated left atrial pressure.  2. Right ventricular systolic function is normal. The right ventricular size is normal. There is normal pulmonary artery systolic pressure.  3. Left atrial size was mild to moderately dilated.  4. Lead seen in RA. Right atrial size was mildly dilated.  5. Trivial mitral valve regurgitation.  6. AV is thickened, calcified with restricted motion Peak and mean gradients through the valve are 19 and 9 mm Hg respectively. Dimensionless index is 0.53 Overall consistent with mild AS. Marland Kitchen The aortic valve is tricuspid. Aortic valve regurgitation is not visualized.  7. The inferior vena cava is normal in size with greater than 50% respiratory variability, suggesting right atrial pressure of 3 mmHg. FINDINGS  Left Ventricle: LV function is vigorous with near cavity obliteration during systole. Left ventricular ejection fraction, by estimation, is 70 to 75%. The left ventricle has hyperdynamic function. The left ventricle has no regional wall motion abnormalities. The left ventricular internal cavity size was small. There is severe left ventricular hypertrophy. Left ventricular diastolic parameters are consistent with Grade I diastolic dysfunction (impaired relaxation). Elevated left atrial pressure. Right Ventricle: The right ventricular size is  normal. Right vetricular wall thickness was not assessed. Right ventricular systolic function is normal. There is normal pulmonary artery systolic pressure. The tricuspid regurgitant velocity is 2.09 m/s, and with an assumed right atrial pressure of 3 mmHg, the estimated right ventricular systolic pressure is 25.4 mmHg. Left Atrium: Left atrial size was mild to moderately dilated. Right Atrium: Lead seen in RA. Right atrial size was mildly dilated. Pericardium: There is no evidence of pericardial effusion. Mitral Valve: There is mild thickening of the mitral valve leaflet(s). Mild mitral annular calcification. Trivial mitral valve regurgitation. MV peak gradient, 6.5 mmHg. The mean mitral valve gradient is 3.0 mmHg. Tricuspid Valve: The tricuspid valve is normal in structure. Tricuspid valve regurgitation is trivial. Aortic Valve: AV is thickened, calcified with restricted motion Peak and mean gradients through the valve are 19 and 9 mm Hg respectively. Dimensionless index is 0.53 Overall  consistent with mild AS. The aortic valve is tricuspid. Aortic valve regurgitation is not visualized. Aortic valve mean gradient measures 10.2 mmHg. Aortic valve peak gradient measures 19.8 mmHg. Aortic valve area, by VTI measures 1.84 cm. Pulmonic Valve: The pulmonic valve was normal in structure. Pulmonic valve regurgitation is trivial. Aorta: The aortic root is normal in size and structure. Venous: The inferior vena cava is normal in size with greater than 50% respiratory variability, suggesting right atrial pressure of 3 mmHg. IAS/Shunts: No atrial level shunt detected by color flow Doppler.  LEFT VENTRICLE PLAX 2D LVIDd:         3.30 cm   Diastology LVIDs:         2.30 cm   LV e' medial:    3.16 cm/s LV PW:         1.80 cm   LV E/e' medial:  22.0 LV IVS:        1.90 cm   LV e' lateral:   7.05 cm/s LVOT diam:     2.10 cm   LV E/e' lateral: 9.8 LV SV:         85 LV SV Index:   41 LVOT Area:     3.46 cm  RIGHT VENTRICLE RV  Basal diam:  4.20 cm RV Mid diam:    3.10 cm RV S prime:     9.41 cm/s TAPSE (M-mode): 2.3 cm LEFT ATRIUM              Index        RIGHT ATRIUM           Index LA diam:        3.20 cm  1.54 cm/m   RA Area:     23.10 cm LA Vol (A2C):   113.0 ml 54.29 ml/m  RA Volume:   70.50 ml  33.87 ml/m LA Vol (A4C):   78.8 ml  37.86 ml/m LA Biplane Vol: 98.8 ml  47.47 ml/m  AORTIC VALVE                     PULMONIC VALVE AV Area (Vmax):    1.90 cm      PV Vmax:       1.00 m/s AV Area (Vmean):   1.92 cm      PV Peak grad:  4.0 mmHg AV Area (VTI):     1.84 cm AV Vmax:           222.75 cm/s AV Vmean:          148.250 cm/s AV VTI:            0.462 m AV Peak Grad:      19.8 mmHg AV Mean Grad:      10.2 mmHg LVOT Vmax:         122.00 cm/s LVOT Vmean:        82.300 cm/s LVOT VTI:          0.245 m LVOT/AV VTI ratio: 0.53  AORTA Ao Root diam: 3.40 cm Ao Asc diam:  3.50 cm MITRAL VALVE                TRICUSPID VALVE MV Area (PHT): 4.10 cm     TR Peak grad:   17.5 mmHg MV Area VTI:   3.31 cm     TR Vmax:        209.00 cm/s MV Peak grad:  6.5 mmHg MV Mean grad:  3.0 mmHg  SHUNTS MV Vmax:       1.27 m/s     Systemic VTI:  0.24 m MV Vmean:      69.6 cm/s    Systemic Diam: 2.10 cm MV Decel Time: 185 msec MV E velocity: 69.40 cm/s MV A velocity: 115.00 cm/s MV E/A ratio:  0.60 Dorris Carnes MD Electronically signed by Dorris Carnes MD Signature Date/Time: 02/05/2021/3:15:32 PM    Final    CT Renal Stone Study  Result Date: 02/20/2021 CLINICAL DATA:  Urinary retention.  History of prostate cancer. EXAM: CT ABDOMEN AND PELVIS WITHOUT CONTRAST TECHNIQUE: Multidetector CT imaging of the abdomen and pelvis was performed following the standard protocol without IV contrast. COMPARISON:  02/03/2021. FINDINGS: Lower chest: The heart is normal in size and scattered coronary artery calcifications are noted. Pacemaker leads are present in the heart. A 4 mm nodule is present in the right lower lobe, axial image 8. Hepatobiliary: The liver is  within normal limits. Mild intrahepatic and extrahepatic biliary ductal dilatation is seen which is likely due to patient's post cholecystectomy status. Pancreas: Unremarkable. No pancreatic ductal dilatation or surrounding inflammatory changes. Spleen: Normal in size without focal abnormality. Adrenals/Urinary Tract: There is redemonstration of a right adrenal mass measuring 3.2 cm, unchanged. The left adrenal gland is within normal limits. Scattered hypo and hyperdense structures are present in the kidneys bilaterally. Complex masslike lesions are noted in the kidneys bilaterally measuring 2.6 cm in the mid left kidney and 2.6 cm in the mid right kidney. No renal or ureteral calculus or obstructive uropathy bilaterally. The bladder is unremarkable. Stomach/Bowel: The stomach is nondistended. No bowel obstruction, free air or pneumatosis. A few scattered diverticular noted along the colon without evidence of diverticulitis. Right hemicolectomy changes are noted. Vascular/Lymphatic: Aortic atherosclerosis with ectasia of the distal abdominal aorta. 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 The common iliac artery on the left measures 1.8 cm. No abdominal or pelvic lymphadenopathy. Reproductive: The prostate gland is enlarged and contains multiple radiopaque brachytherapy seeds. Other: No free fluid. A small fat containing umbilical hernia is noted. There is a small right inguinal fat containing hernia. Musculoskeletal: Degenerative changes are present in the thoracolumbar spine and bilateral hips. A stable sclerotic lesion is noted in the L2 vertebral body on the right, possible bone island. IMPRESSION: 1. No renal or ureteral calculus or obstructive uropathy bilaterally. 2. Multiple hyperdense and hypodense lesions in the kidneys bilaterally. Complex masses are noted in the mid kidneys bilaterally, compatible with history of multifocal renal cell carcinoma and slightly  increased in size from the prior exam. 3. Enlarged prostate gland containing brachytherapy seeds. 4. Right lower lobe pulmonary nodule. No follow-up needed if patient is low-risk. Non-contrast chest CT can be considered in 12 months if patient is high-risk. This recommendation follows the consensus statement: Guidelines for Management of Incidental Pulmonary Nodules Detected on CT Images: From the Fleischner Society 2017; Radiology 2017; 284:228-243. 5. Aortic atherosclerosis. 6. Coronary artery calcifications. 7. Remaining chronic findings as described above. Electronically Signed   By: Brett Fairy M.D.   On: 02/20/2021 00:28   CT Angio Chest/Abd/Pel for Dissection W and/or Wo Contrast  Result Date: 02/03/2021 CLINICAL DATA:  Right flank pain.  Acute aortic syndrome suspected. EXAM: CT ANGIOGRAPHY CHEST, ABDOMEN AND PELVIS TECHNIQUE: Non-contrast CT of the chest was initially obtained. Multidetector CT imaging through the chest, abdomen and pelvis was performed using the standard protocol during bolus administration of intravenous  contrast. Multiplanar reconstructed images and MIPs were obtained and reviewed to evaluate the vascular anatomy. CONTRAST:  137mL OMNIPAQUE IOHEXOL 350 MG/ML SOLN COMPARISON:  Chest abdomen pelvis CT 06/12/2020. Abdomen/pelvis CT 10/25/2020. FINDINGS: CTA CHEST FINDINGS Cardiovascular: Pre contrast imaging shows no hyperdense crescent in the wall of the thoracic aorta to suggest acute intramural hematoma. Ascending thoracic aorta measures 4.5 cm diameter stable since 06/12/2020. No mural thickening or irregularity in the thoracic aorta. There is no dissection of the thoracic aorta. Branch vessel anatomy from the aortic arch opacifies normally. Left permanent pacemaker noted. Mediastinum/Nodes: No mediastinal lymphadenopathy. There is no hilar lymphadenopathy. The esophagus has normal imaging features. There is no axillary lymphadenopathy. Lungs/Pleura: No suspicious pulmonary  nodule or mass. 4 mm peripheral left lower lobe pulmonary nodule identified previously is stable a on 88/7 today. No focal airspace consolidation. There is no evidence of pleural effusion. Musculoskeletal: No worrisome lytic or sclerotic osseous abnormality. Review of the MIP images confirms the above findings. CTA ABDOMEN AND PELVIS FINDINGS VASCULAR Aorta: Normal caliber aorta without aneurysm, dissection, vasculitis or significant stenosis. Celiac: Calcific ostial plaque. Patent without evidence of aneurysm, dissection, vasculitis or significant stenosis. SMA: Patent without evidence of aneurysm, dissection, vasculitis or significant stenosis. Renals: Both renal arteries are patent without evidence of aneurysm, dissection, vasculitis, fibromuscular dysplasia or significant stenosis. IMA: Patent without evidence of aneurysm, dissection, vasculitis or significant stenosis. Inflow: Patent without evidence of aneurysm, dissection, vasculitis or significant stenosis. Right common iliac artery measures 1.9 cm diameter. Veins: No obvious venous abnormality within the limitations of this arterial phase study. Review of the MIP images confirms the above findings. NON-VASCULAR Hepatobiliary: Scattered tiny foci of arterial phase hyperenhancement are identified in both hepatic lobes, stable since 06/12/2020, likely benign and potentially vascular malformation or flash filling hemangiomas. Gallbladder surgically absent. No intrahepatic or extrahepatic biliary dilation. Pancreas: No focal mass lesion. No dilatation of the main duct. No intraparenchymal cyst. No peripancreatic edema. Spleen: No splenomegaly. No focal mass lesion. Adrenals/Urinary Tract: Stable 3.2 cm right adrenal mass. No left adrenal nodule or mass. Multiple bilateral renal lesions again noted. Previous CT of 09/16/2019 characterized enhancing lesions in both kidneys. 2.3 cm enhancing lesion lateral interpolar right kidney was 2.2 cm on that study. 2.2 cm  lesion in the lower pole right kidney shows apparent enhancement, also stable since 09/16/2019. Right renal lesions are stable. No evidence for hydroureter. The urinary bladder appears normal for the degree of distention. Stomach/Bowel: Stomach is unremarkable. No gastric wall thickening. No evidence of outlet obstruction. Duodenum is normally positioned as is the ligament of Treitz. No small bowel wall thickening. No small bowel dilatation. Status post right hemicolectomy. No gross colonic mass. No colonic wall thickening. Lymphatic: No abdominal lymphadenopathy No pelvic sidewall lymphadenopathy. Reproductive: Brachytherapy seeds noted in the prostate gland. Other: No intraperitoneal free fluid. Musculoskeletal: No worrisome lytic or sclerotic osseous abnormality. Degenerative changes noted in both hips, right greater than left. Review of the MIP images confirms the above findings. IMPRESSION: 1. No evidence for acute intramural hematoma or dissection of the thoracic aorta. There is no dissection of the thoracoabdominal aorta. 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 2. No acute findings in the chest, abdomen or pelvis. Specifically, no findings to explain the patient's history of right-sided flank pain. 3. Multiple bilateral renal lesions again noted with enhancing lesions in both kidneys as characterized previously. No substantial interval change. 4. Scattered foci of  arterial phase hyperenhancement in the liver are unchanged. Continued attention on follow-up recommended. 5. Stable 3.2 cm right adrenal mass. 6. Status post right hemicolectomy. 7. Aortic Atherosclerosis (ICD10-I70.0). Electronically Signed   By: Misty Stanley M.D.   On: 02/03/2021 06:53   US Abdomen Limited RUQ (LIVER/GB)  Result Date: 02/04/2021 CLINICAL DATA:  Abnormal LFTs EXAM: ULTRASOUND ABDOMEN LIMITED RIGHT UPPER QUADRANT COMPARISON:  CT 10/25/2020  FINDINGS: Gallbladder: Prior cholecystectomy Common bile duct: Diameter: Dilated, measuring 12 mm. This is stable when compared to prior CT, likely related to patient's age and post cholecystectomy state. Liver: No focal lesion identified. Within normal limits in parenchymal echogenicity. Portal vein is patent on color Doppler imaging with normal direction of blood flow towards the liver. Other: None. IMPRESSION: Prior cholecystectomy. Prominent common bile duct is stable likely related to age and post cholecystectomy state. No acute findings. Electronically Signed   By: Rolm Baptise M.D.   On: 02/04/2021 09:15     ASSESSMENT:  1.  Metastatic prostate cancer to the right adrenal gland: -Presentation to the ER with worsening right-sided back pain, which has been on and off for the last couple of years. - Some decrease in appetite with 6 pound weight loss in the last 6 months.  No hematuria or other B symptoms. - CT renal study on 05/03/2020 showed new right adrenal mass with bilateral renal lesions.  Indeterminate L2 vertebral bodies sclerotic lesion.  Low-attenuation lesions in the kidneys measure up to 2.3 cm on the right and were similar to scan from July 2021. - CT CAP on 06/12/2020 did not show any evidence of nodal metastasis or metastatic disease in the chest.  Right adrenal mass measures 4.2 x 3.8 cm exerts mass-effect upon the posterior and right lateral wall of the IVC.  Right kidney masses measuring 2.3 x 2.3 cm and 1.9 x 1.7 cm suspicious for RCC.  Upper pole of the left kidney lesion 2.2 x 2.0 cm and medial cortex of the inferior pole measuring 2 x 2 cm and another interpolar left kidney lesion measuring 1.3 x 1.5 cm. - Bone scan on 06/13/2020 was negative. - Right adrenal biopsy consistent with prostatic adenocarcinoma - Degarelix on 07/16/2020, transition to Eligard 45 mg on 08/21/2020.   2.  Social/family history: - He lives at home with his daughter.  He walks with a cane. - He mows lawn with  a riding mower.  He has a garden and gross vegetables.  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  Quit smoking 25 years ago. - No family history of malignancies that he is aware of.   PLAN:  1.  Metastatic prostate cancer to the right adrenal gland: - Eligard 45 mg on 02/26/2021. - Last PSA 2.45 on 01/15/2021, up from 2.22 on 12/11/2020. - Abiraterone 500 mg daily along with prednisone 5 mg daily started around 01/22/2021. - Hospitalized from 02/03/2021 through 02/05/2021 with abdominal pain with normal EGD and some gastritis. - Recent hospitalization on 02/19/2021 to 02/22/2021 with acute kidney injury, near syncope due to dehydration.  I have reviewed hospitalization records. - He is tolerating Abiraterone 500 mg daily reasonably well. - Reviewed labs today which showed creatinine 1.69, slightly above his baseline.  LFTs are normal.  Potassium was 4.3.  CBC was grossly normal.  Blood pressure is 992 systolic. - Continue same dose of Abiraterone with prednisone. - RTC 3 weeks with repeat PSA.     2.  Right sided back pain: - Right loin pain  is stable. - We will restart him back on tramadol 50 mg twice daily.  3.  Urinary urgency: - Continue Rapaflo.  4.  Decreased appetite: - He has lost 10 pounds since last 2 hospitalizations. - Continue Megace twice daily.  He is drinking 1 Ensure per day. - Recommend increase Ensure to 2 cans/day.   Orders placed this encounter:  No orders of the defined types were placed in this encounter.    Derek Jack, MD [place] [phone]   I, Thana Ates, am acting as a scribe for Dr. Derek Jack.  I, Derek Jack MD, have reviewed the above documentation for accuracy and completeness, and I agree with the above.

## 2021-02-26 NOTE — Patient Instructions (Addendum)
[place] at [facility] Discharge Instructions   You were seen and examined today by Dr. Delton Coombes.  He reviewed your lab work with you, which is normal/stable.  Continue Zytiga as prescribed.   Continue Megace as prescribed for appetite.  Increase Ensure to 2 cans a day.   We refilled his prescription for Tramadol for pain.   Return as scheduled for lab work and office visit.   Please call the clinic should you have any questions or concerns prior to your next visit.    Thank you for choosing [place] at [facility] to provide your oncology and hematology care.  To afford each patient quality time with our provider, please arrive at least 15 minutes before your scheduled appointment time.   If you have a lab appointment with the [facility] please come in thru the Main Entrance and check in at the main information desk.  You need to re-schedule your appointment should you arrive 10 or more minutes late.  We strive to give you quality time with our providers, and arriving late affects you and other patients whose appointments are after yours.  Also, if you no show three or more times for appointments you may be dismissed from the clinic at the providers discretion.     Again, thank you for choosing [facility].  Our hope is that these requests will decrease the amount of time that you wait before being seen by our physicians.       _____________________________________________________________  Should you have questions after your visit to [facility], please contact our office at [phone] and follow the prompts.  Our office hours are 8:00 a.m. and 4:30 p.m. Monday - Friday.  Please note that voicemails left after 4:00 p.m. may not be returned until the following business day.  We are closed weekends and major holidays.  You do have access to a nurse 24-7, just call the main number to the clinic  [phone] and do not press any options, hold on the line and a nurse will answer the phone.    For prescription refill requests, have your pharmacy contact our office and allow 72 hours.    Due to Covid, you will need to wear a mask upon entering the hospital. If you do not have a mask, a mask will be given to you at the Main Entrance upon arrival. For doctor visits, patients may have 1 support person age 65 or older with them. For treatment visits, patients can not have anyone with them due to social distancing guidelines and our immunocompromised population.

## 2021-02-26 NOTE — Progress Notes (Signed)
Zytiga refilled per Dr. Delton Coombes

## 2021-02-26 NOTE — Patient Instructions (Signed)
[place]  Discharge Instructions: Thank you for choosing [facility] to provide your oncology and hematology care.  If you have a lab appointment with the [facility], please come in thru the Main Entrance and check in at the main information desk.  Wear comfortable clothing and clothing appropriate for easy access to any Portacath or PICC line.   We strive to give you quality time with your provider. You may need to reschedule your appointment if you arrive late (15 or more minutes).  Arriving late affects you and other patients whose appointments are after yours.  Also, if you miss three or more appointments without notifying the office, you may be dismissed from the clinic at the providers discretion.      For prescription refill requests, have your pharmacy contact our office and allow 72 hours for refills to be completed.    Today you received the following chemotherapy and/or immunotherapy agents Lupron      To help prevent nausea and vomiting after your treatment, we encourage you to take your nausea medication as directed.  BELOW ARE SYMPTOMS THAT SHOULD BE REPORTED IMMEDIATELY: *FEVER GREATER THAN 100.4 F (38 C) OR HIGHER *CHILLS OR SWEATING *NAUSEA AND VOMITING THAT IS NOT CONTROLLED WITH YOUR NAUSEA MEDICATION *UNUSUAL SHORTNESS OF BREATH *UNUSUAL BRUISING OR BLEEDING *URINARY PROBLEMS (pain or burning when urinating, or frequent urination) *BOWEL PROBLEMS (unusual diarrhea, constipation, pain near the anus) TENDERNESS IN MOUTH AND THROAT WITH OR WITHOUT PRESENCE OF ULCERS (sore throat, sores in mouth, or a toothache) UNUSUAL RASH, SWELLING OR PAIN  UNUSUAL VAGINAL DISCHARGE OR ITCHING   Items with * indicate a potential emergency and should be followed up as soon as possible or go to the Emergency Department if any problems should occur.  Please show the CHEMOTHERAPY ALERT CARD or IMMUNOTHERAPY ALERT CARD at check-in to the Emergency  Department and triage nurse.  Should you have questions after your visit or need to cancel or reschedule your appointment, please contact [facility] [phone]  and follow the prompts.  Office hours are 8:00 a.m. to 4:30 p.m. Monday - Friday. Please note that voicemails left after 4:00 p.m. may not be returned until the following business day.  We are closed weekends and major holidays. You have access to a nurse at all times for urgent questions. Please call the main number to the clinic [phone] and follow the prompts.  For any non-urgent questions, you may also contact your provider using MyChart. We now offer e-Visits for anyone 50 and older to request care online for non-urgent symptoms. For details visit [email or web address].   Also download the MyChart app! Go to the app store, search "MyChart", open the app, select [place], and log in with your MyChart username and password.  Due to Covid, a mask is required upon entering the hospital/clinic. If you do not have a mask, one will be given to you upon arrival. For doctor visits, patients may have 1 support person aged 72 or older with them. For treatment visits, patients cannot have anyone with them due to current Covid guidelines and our immunocompromised population.

## 2021-02-26 NOTE — Progress Notes (Signed)
Derrick Long presents today for Lupron injection per the provider's orders.  Stable during administration without incident; injection site WNL; see MAR for injection details.  Patient tolerated procedure well and without incident.  No questions or complaints noted at this time. Discharge from clinic via wheelchair in stable condition.  Alert and oriented X 3.  Follow up with [facility] as scheduled.

## 2021-03-06 ENCOUNTER — Emergency Department (HOSPITAL_COMMUNITY): Payer: No Typology Code available for payment source

## 2021-03-06 ENCOUNTER — Encounter (HOSPITAL_COMMUNITY): Payer: Self-pay | Admitting: Emergency Medicine

## 2021-03-06 ENCOUNTER — Other Ambulatory Visit: Payer: Self-pay

## 2021-03-06 ENCOUNTER — Emergency Department (HOSPITAL_COMMUNITY)
Admission: EM | Admit: 2021-03-06 | Discharge: 2021-03-06 | Disposition: A | Discharge status: Home / Self Care | Payer: No Typology Code available for payment source | Attending: Emergency Medicine | Admitting: Emergency Medicine

## 2021-03-06 DIAGNOSIS — I4891 Unspecified atrial fibrillation: Secondary | ICD-10-CM | POA: Diagnosis not present

## 2021-03-06 DIAGNOSIS — R5383 Other fatigue: Secondary | ICD-10-CM | POA: Diagnosis not present

## 2021-03-06 DIAGNOSIS — Z7901 Long term (current) use of anticoagulants: Secondary | ICD-10-CM | POA: Insufficient documentation

## 2021-03-06 DIAGNOSIS — E860 Dehydration: Secondary | ICD-10-CM | POA: Insufficient documentation

## 2021-03-06 DIAGNOSIS — Z20822 Contact with and (suspected) exposure to covid-19: Secondary | ICD-10-CM | POA: Insufficient documentation

## 2021-03-06 DIAGNOSIS — R531 Weakness: Secondary | ICD-10-CM | POA: Diagnosis present

## 2021-03-06 DIAGNOSIS — Z79899 Other long term (current) drug therapy: Secondary | ICD-10-CM | POA: Diagnosis not present

## 2021-03-06 DIAGNOSIS — Z8546 Personal history of malignant neoplasm of prostate: Secondary | ICD-10-CM | POA: Insufficient documentation

## 2021-03-06 DIAGNOSIS — N179 Acute kidney failure, unspecified: Secondary | ICD-10-CM | POA: Diagnosis not present

## 2021-03-06 DIAGNOSIS — I10 Essential (primary) hypertension: Secondary | ICD-10-CM | POA: Insufficient documentation

## 2021-03-06 DIAGNOSIS — R197 Diarrhea, unspecified: Secondary | ICD-10-CM | POA: Insufficient documentation

## 2021-03-06 DIAGNOSIS — R0602 Shortness of breath: Secondary | ICD-10-CM | POA: Insufficient documentation

## 2021-03-06 LAB — URINALYSIS, ROUTINE W REFLEX MICROSCOPIC
Bilirubin Urine: NEGATIVE
Glucose, UA: NEGATIVE mg/dL
Hgb urine dipstick: NEGATIVE
Ketones, ur: NEGATIVE mg/dL
Leukocytes,Ua: NEGATIVE
Nitrite: NEGATIVE
Protein, ur: NEGATIVE mg/dL
Specific Gravity, Urine: 1.017 (ref 1.005–1.030)
pH: 5 (ref 5.0–8.0)

## 2021-03-06 LAB — CBC WITH DIFFERENTIAL/PLATELET
Abs Immature Granulocytes: 0.02 10*3/uL (ref 0.00–0.07)
Basophils Absolute: 0 10*3/uL (ref 0.0–0.1)
Basophils Relative: 0 %
Eosinophils Absolute: 0 10*3/uL (ref 0.0–0.5)
Eosinophils Relative: 1 %
HCT: 33.3 % — ABNORMAL LOW (ref 39.0–52.0)
Hemoglobin: 11.7 g/dL — ABNORMAL LOW (ref 13.0–17.0)
Immature Granulocytes: 0 %
Lymphocytes Relative: 12 %
Lymphs Abs: 0.6 10*3/uL — ABNORMAL LOW (ref 0.7–4.0)
MCH: 35.2 pg — ABNORMAL HIGH (ref 26.0–34.0)
MCHC: 35.1 g/dL (ref 30.0–36.0)
MCV: 100.3 fL — ABNORMAL HIGH (ref 80.0–100.0)
Monocytes Absolute: 0.4 10*3/uL (ref 0.1–1.0)
Monocytes Relative: 8 %
Neutro Abs: 4.2 10*3/uL (ref 1.7–7.7)
Neutrophils Relative %: 79 %
Platelets: 154 10*3/uL (ref 150–400)
RBC: 3.32 MIL/uL — ABNORMAL LOW (ref 4.22–5.81)
RDW: 13.3 % (ref 11.5–15.5)
WBC: 5.3 10*3/uL (ref 4.0–10.5)
nRBC: 0.4 % — ABNORMAL HIGH (ref 0.0–0.2)

## 2021-03-06 LAB — COMPREHENSIVE METABOLIC PANEL
ALT: 18 U/L (ref 0–44)
AST: 17 U/L (ref 15–41)
Albumin: 3 g/dL — ABNORMAL LOW (ref 3.5–5.0)
Alkaline Phosphatase: 45 U/L (ref 38–126)
Anion gap: 10 (ref 5–15)
BUN: 38 mg/dL — ABNORMAL HIGH (ref 8–23)
CO2: 19 mmol/L — ABNORMAL LOW (ref 22–32)
Calcium: 8.8 mg/dL — ABNORMAL LOW (ref 8.9–10.3)
Chloride: 103 mmol/L (ref 98–111)
Creatinine, Ser: 2.06 mg/dL — ABNORMAL HIGH (ref 0.61–1.24)
GFR, Estimated: 30 mL/min — ABNORMAL LOW (ref >60)
Glucose, Bld: 101 mg/dL — ABNORMAL HIGH (ref 70–99)
Potassium: 4.5 mmol/L (ref 3.5–5.1)
Sodium: 132 mmol/L — ABNORMAL LOW (ref 135–145)
Total Bilirubin: 1.2 mg/dL (ref 0.3–1.2)
Total Protein: 6.6 g/dL (ref 6.5–8.1)

## 2021-03-06 LAB — RESP PANEL BY RT-PCR (FLU A&B, COVID) ARPGX2
Influenza A by PCR: NEGATIVE
Influenza B by PCR: NEGATIVE
SARS Coronavirus 2 by RT PCR: NEGATIVE

## 2021-03-06 MED ORDER — SODIUM CHLORIDE 0.9 % IV SOLN
1000.0000 mL | INTRAVENOUS | Status: DC
  Administered 2021-03-06: 1000 mL via INTRAVENOUS

## 2021-03-06 MED ORDER — SODIUM CHLORIDE 0.9 % IV BOLUS (SEPSIS)
500.0000 mL | Freq: Once | INTRAVENOUS | Status: AC
  Administered 2021-03-06: 500 mL via INTRAVENOUS

## 2021-03-06 NOTE — ED Provider Notes (Signed)
[facility] EMERGENCY DEPARTMENT Provider Note   CSN: [id number] Arrival date & time: 03/06/21  1036     History  Chief Complaint  Patient presents with   Weakness    Derrick Long is a 88 y.o. male.   Weakness Associated symptoms: no abdominal pain and no fever    Patient has a history of hypertension, that is static prostate cancer, hyperlipidemia, gout, anemia and and paroxysmal atrial fibrillation.  Patient was recently admitted to the hospital on January 4 for metabolic acidosis, hyponatremia and acute kidney injury.  Patient was discharged on January 7.  Patient did follow-up in the oncology office on January 11 and it was noted he was still having trouble with fatigue and shortness of breath.  He has had severe back pain associated with his cancer.  He has been having diarrhea and weight loss.  He did have a Lupron injection on January 11.  Patient states he has had persistent issues with intermittent weakness.  Patient states sometimes when he will stand up he will feel like his legs are going to give out.  He feels weak all over when this occurs.  He has not had any focal neurologic deficits.  He has not had any trouble with his speech.  He denies any recent fevers or chills.  No vomiting.  No abdominal pain.  No coughing.  He had another episode of the weakness this morning he came to the ED for evaluation  Home Medications Prior to Admission medications   Medication Sig Start Date End Date Taking? Authorizing Provider  abiraterone acetate (ZYTIGA) 250 MG tablet Take 2 tablets (500 mg total) by mouth daily. Take on an empty stomach 1 hour before or 2 hours after a meal 02/26/21   Derek Jack, MD  acetaminophen (TYLENOL) 650 MG CR tablet Take 1,300 mg by mouth every 8 (eight) hours as needed for pain. Patient not taking: Reported on 02/26/2021    [provider]  amLODipine (NORVASC) 10 MG tablet Take 10 mg by mouth daily.    [provider]   atorvastatin (LIPITOR) 20 MG tablet Take 1 tablet (20 mg total) by mouth daily at 6 PM. Patient taking differently: Take 20 mg by mouth every evening. 07/23/12   Lyda Jester M, PA-C  carvedilol (COREG) 25 MG tablet Take 50 mg by mouth 2 (two) times daily with a meal.    [provider]  Cholecalciferol 25 MCG (1000 UT) tablet Take 1,000 Units by mouth daily.    [provider]  diclofenac sodium (VOLTAREN) 1 % GEL Apply 2 g topically 4 (four) times daily. Patient taking differently: Apply 2 g topically 4 (four) times daily as needed (pain). 06/19/17   Rancour, Annie Main, MD  Ensure (ENSURE) Take 237 mLs by mouth daily as needed (nutrition). Vanilla or chocolate Patient not taking: Reported on 02/26/2021    [provider]  ferrous sulfate 324 MG TBEC Take 324 mg by mouth 2 (two) times daily.    [provider]  fluticasone (FLONASE) 50 MCG/ACT nasal spray Place 2 sprays into both nostrils daily. Patient taking differently: Place 2 sprays into both nostrils daily as needed for allergies. 02/15/16   Ward, Delice Bison, DO  megestrol (MEGACE) 400 MG/10ML suspension Take 10 mLs (400 mg total) by mouth 2 (two) times daily. 01/15/21   Derek Jack, MD  ondansetron (ZOFRAN-ODT) 8 MG disintegrating tablet Take 1 tablet (8 mg total) by mouth every 8 (eight) hours as needed for nausea  or vomiting. Patient not taking: Reported on 02/26/2021 02/05/21   Barton Dubois, MD  pantoprazole (PROTONIX) 40 MG tablet Take 1 tablet (40 mg total) by mouth 2 (two) times daily. 02/05/21 02/05/22  Barton Dubois, MD  potassium chloride SA (KLOR-CON) 20 MEQ tablet Take 20 mEq by mouth daily. 02/28/20   [provider]  predniSONE (DELTASONE) 5 MG tablet Take 1 tablet (5 mg total) by mouth daily with breakfast. 01/15/21   Derek Jack, MD  rivaroxaban (XARELTO) 20 MG TABS tablet Take 1 tablet (20 mg total) by mouth daily with supper. 10/30/13   Croitoru, Mihai, MD   silodosin (RAPAFLO) 8 MG CAPS capsule Take 1 capsule (8 mg total) by mouth at bedtime. Patient taking differently: Take 8 mg by mouth every evening. 01/21/21   Festus Aloe, MD  traMADol (ULTRAM) 50 MG tablet Take 1 tablet (50 mg total) by mouth 2 (two) times daily. 02/26/21   Derek Jack, MD      Allergies    Patient has no known allergies.    Review of Systems   Review of Systems  Constitutional:  Negative for fever.  Gastrointestinal:  Negative for abdominal pain.  Genitourinary:  Positive for difficulty urinating (Intermittent).  Neurological:  Positive for weakness.   Physical Exam Updated Vital Signs BP 133/62 (BP Location: Left Arm)    Pulse 72    Temp 98.2 F (36.8 C) (Oral)    Ht 1.854 m (6\' 1" )    Wt 86.2 kg    SpO2 98%    BMI 25.07 kg/m  Physical Exam Vitals and nursing note reviewed.  Constitutional:      Appearance: He is well-developed. He is not diaphoretic.     Comments: Elderly, frail  HENT:     Head: Normocephalic and atraumatic.     Right Ear: External ear normal.     Left Ear: External ear normal.  Eyes:     General: No scleral icterus.       Right eye: No discharge.        Left eye: No discharge.     Conjunctiva/sclera: Conjunctivae normal.  Neck:     Trachea: No tracheal deviation.  Cardiovascular:     Rate and Rhythm: Normal rate and regular rhythm.  Pulmonary:     Effort: Pulmonary effort is normal. No respiratory distress.     Breath sounds: Normal breath sounds. No stridor. No wheezing or rales.  Abdominal:     General: Bowel sounds are normal. There is no distension.     Palpations: Abdomen is soft.     Tenderness: There is no abdominal tenderness. There is no guarding or rebound.  Musculoskeletal:        General: No tenderness or deformity.     Cervical back: Neck supple.  Skin:    General: Skin is warm and dry.     Findings: No rash.  Neurological:     General: No focal deficit present.     Mental Status: He is alert.      Cranial Nerves: No cranial nerve deficit (no facial droop, extraocular movements intact, no slurred speech).     Sensory: No sensory deficit.     Motor: No abnormal muscle tone or seizure activity.     Coordination: Coordination normal.     Comments: Equal grip strength bilaterally, patient is able to lift both legs off the bed, no sensory deficits  Psychiatric:        Mood and Affect: Mood normal.  ED Results / Procedures / Treatments   Labs (all labs ordered are listed, but only abnormal results are displayed) Labs Reviewed  COMPREHENSIVE METABOLIC PANEL - Abnormal; Notable for the following components:      Result Value   Sodium 132 (*)    CO2 19 (*)    Glucose, Bld 101 (*)    BUN 38 (*)    Creatinine, Ser 2.06 (*)    Calcium 8.8 (*)    Albumin 3.0 (*)    GFR, Estimated 30 (*)    All other components within normal limits  CBC WITH DIFFERENTIAL/PLATELET - Abnormal; Notable for the following components:   RBC 3.32 (*)    Hemoglobin 11.7 (*)    HCT 33.3 (*)    MCV 100.3 (*)    MCH 35.2 (*)    nRBC 0.4 (*)    Lymphs Abs 0.6 (*)    All other components within normal limits  RESP PANEL BY RT-PCR (FLU A&B, COVID) ARPGX2  URINALYSIS, ROUTINE W REFLEX MICROSCOPIC    EKG None  Radiology DG Chest 2 View  Result Date: 03/06/2021 CLINICAL DATA:  Intermittent weakness EXAM: CHEST - 2 VIEW COMPARISON:  02/19/2021 FINDINGS: Transverse diameter of heart is slightly increased. Thoracic aorta is tortuous and ectatic. Lung fields are clear of any infiltrates or pulmonary edema. Left hemidiaphragm is elevated. There is no pleural effusion or pneumothorax. Pacemaker battery is seen in the left infraclavicular region. Degenerative changes are noted in the right shoulder and right AC joint. IMPRESSION: No active cardiopulmonary disease. Electronically Signed   By: Elmer Picker M.D.   On: 03/06/2021 12:36    Procedures Procedures    Medications Ordered in ED Medications  sodium  chloride 0.9 % bolus 500 mL (0 mLs Intravenous Stopped 03/06/21 1806)    ED Course/ Medical Decision Making/ A&P Clinical Course as of 03/07/21 0707  Thu Mar 06, 2021  1347 CBC with Differential(!) Hemoglobin similar to previous values.  Slightly decreased from last but similar to hemoglobin prior [JK]  1347 Comprehensive metabolic panel(!) Bicarb decreased and creatinine elevated compared to previous [JK]  1347 DG Chest 2 View Chest x-ray images and report reviewed.  No pneumonia [JK]    Clinical Course User Index [JK] Dorie Rank, MD                           Medical Decision Making Amount and/or Complexity of Data Reviewed Labs: ordered. Decision-making details documented in ED Course. Radiology: ordered. Decision-making details documented in ED Course.  Risk Prescription drug management.   Pt with generalized weakness.  Recent admission for AKI in setting of metastatic prostate Ca.  Does have an increase in creatine again.  Treated with IV fluids.  COvid flu pending at shift change.  No active vomiting or diarrhea in the ED.  Plan will be to reassess after all tests results back.  Could consider re admission for AKI versus close outpatient follow up.        Final Clinical Impression(s) / ED Diagnoses Final diagnoses:  Generalized weakness  Dehydration  AKI (acute kidney injury) [facility])    Rx / DC Orders ED Discharge Orders     None         Dorie Rank, MD 03/07/21 [phone]

## 2021-03-06 NOTE — ED Triage Notes (Signed)
Pt arrived via [facility] with c/o intermittent weakness for the past couple of days.

## 2021-03-06 NOTE — ED Notes (Signed)
I attempted to obtain IV access twice on pt. Pt family states that "when he comes to the hospital, the nurses always have to use an ultrasound" to obtain access. I notified the nurse certified to do ultrasound IV that pt needs an IV via ultrasound

## 2021-03-06 NOTE — Discharge Instructions (Signed)
Drink plenty of fluid Your testing shows that your kidneys were a little bit dehydrated, we have given you some IV fluids to help, if you should have severe worsening symptoms return to the emergency department otherwise talk to your oncologist about the need to continue with chemotherapy or immunotherapy and talk to your family doctor to have your labs rechecked within 1 week.  If you should have worsening or severe symptoms return to the emergency department immediately

## 2021-03-06 NOTE — ED Provider Notes (Signed)
I have personally viewed and interpreted the patient's laboratory data, there is a slight increase in his creatinine up to 2.06, the patient is aware of this, the family member at the bedside is also aware.  He is not anemic has no leukocytosis urinalysis is clean and the chest x-ray shows no signs of pneumonia.  His COVID test is negative, flu test is negative, vital signs are unremarkable and I gave the patient and the family member the option of being admitted versus going home, they want to go home which I think is reasonable.  They will follow-up in the outpatient setting.   Noemi Chapel, MD 03/06/21 1739

## 2021-03-17 ENCOUNTER — Inpatient Hospital Stay (HOSPITAL_COMMUNITY): Payer: Medicare Other

## 2021-03-18 ENCOUNTER — Other Ambulatory Visit (HOSPITAL_COMMUNITY): Payer: Self-pay

## 2021-03-18 ENCOUNTER — Other Ambulatory Visit: Payer: Self-pay

## 2021-03-18 ENCOUNTER — Inpatient Hospital Stay (HOSPITAL_COMMUNITY): Payer: Medicare Other

## 2021-03-18 DIAGNOSIS — C642 Malignant neoplasm of left kidney, except renal pelvis: Secondary | ICD-10-CM

## 2021-03-18 DIAGNOSIS — C641 Malignant neoplasm of right kidney, except renal pelvis: Secondary | ICD-10-CM

## 2021-03-18 DIAGNOSIS — C61 Malignant neoplasm of prostate: Secondary | ICD-10-CM

## 2021-03-18 DIAGNOSIS — Z5111 Encounter for antineoplastic chemotherapy: Secondary | ICD-10-CM | POA: Diagnosis not present

## 2021-03-18 DIAGNOSIS — E278 Other specified disorders of adrenal gland: Secondary | ICD-10-CM

## 2021-03-18 LAB — COMPREHENSIVE METABOLIC PANEL
ALT: 18 U/L (ref 0–44)
AST: 20 U/L (ref 15–41)
Albumin: 3.2 g/dL — ABNORMAL LOW (ref 3.5–5.0)
Alkaline Phosphatase: 47 U/L (ref 38–126)
Anion gap: 6 (ref 5–15)
BUN: 29 mg/dL — ABNORMAL HIGH (ref 8–23)
CO2: 22 mmol/L (ref 22–32)
Calcium: 8.9 mg/dL (ref 8.9–10.3)
Chloride: 104 mmol/L (ref 98–111)
Creatinine, Ser: 1.7 mg/dL — ABNORMAL HIGH (ref 0.61–1.24)
GFR, Estimated: 37 mL/min — ABNORMAL LOW (ref >60)
Glucose, Bld: 109 mg/dL — ABNORMAL HIGH (ref 70–99)
Potassium: 3.9 mmol/L (ref 3.5–5.1)
Sodium: 132 mmol/L — ABNORMAL LOW (ref 135–145)
Total Bilirubin: 0.9 mg/dL (ref 0.3–1.2)
Total Protein: 6.9 g/dL (ref 6.5–8.1)

## 2021-03-18 LAB — MAGNESIUM: Magnesium: 1.7 mg/dL (ref 1.7–2.4)

## 2021-03-18 NOTE — Telephone Encounter (Signed)
Oral Oncology Patient Advocate Encounter   Was successful in securing patient an $59 grant from Patient [facility]) to provide copayment coverage for Zytiga.  This will keep the out of pocket expense at $0.    The billing information is as follows and has been shared with [place].   Member ID: [id number] Group ID: [id number] RxBin: [id number] Dates of Eligibility: 11/22/20 through 02/19/22  Fund:  [place] [facility] [employer] Phone [phone] Fax [phone] 03/18/2021 9:08 AM

## 2021-03-19 LAB — PSA: Prostatic Specific Antigen: 2.92 ng/mL (ref 0.00–4.00)

## 2021-03-20 ENCOUNTER — Inpatient Hospital Stay (HOSPITAL_COMMUNITY): Payer: Medicare Other | Attending: Hematology | Admitting: Hematology

## 2021-03-20 ENCOUNTER — Telehealth (HOSPITAL_COMMUNITY): Payer: Self-pay | Admitting: *Deleted

## 2021-03-20 ENCOUNTER — Other Ambulatory Visit: Payer: Self-pay

## 2021-03-20 VITALS — BP 115/74 | HR 67 | Temp 97.5°F | Resp 16 | Ht 73.0 in | Wt 177.0 lb

## 2021-03-20 DIAGNOSIS — C61 Malignant neoplasm of prostate: Secondary | ICD-10-CM | POA: Diagnosis not present

## 2021-03-20 DIAGNOSIS — D508 Other iron deficiency anemias: Secondary | ICD-10-CM | POA: Diagnosis not present

## 2021-03-20 DIAGNOSIS — C7971 Secondary malignant neoplasm of right adrenal gland: Secondary | ICD-10-CM | POA: Diagnosis not present

## 2021-03-20 MED ORDER — MEGESTROL ACETATE 400 MG/10ML PO SUSP: 400.0000 mg | Freq: Two times a day (BID) | ORAL | 2 refills | Status: DC

## 2021-03-20 MED ORDER — PANTOPRAZOLE SODIUM 40 MG PO TBEC: 40.0000 mg | DELAYED_RELEASE_TABLET | Freq: Every day | ORAL | 3 refills | Status: DC

## 2021-03-20 NOTE — Progress Notes (Signed)
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    CANCER STAGING:  Cancer Staging  Prostate cancer [facility]) Staging form: Prostate, AJCC 8th Edition - Clinical stage from 07/10/2020: Stage IVB (cTX, cNX, pM1c, PSA: 10.2) - Unsigned   INTERVAL HISTORY:  Derrick Long, a 88 y.o. 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 Derrick Long was last seen on 02/26/2021.   Today he reports feeling fair. He reports increased fatigue and confusion. His activity levels at home are low. His appetite is poor which has not been improved with Megace, and he reports epigastric pain after eating. He has lost 2 lbs since 02/26/2021.  REVIEW OF SYSTEMS:  Review of Systems  Constitutional:  Positive for appetite change, fatigue and unexpected weight change (-2 lbs).  Gastrointestinal:  Positive for abdominal pain.  Psychiatric/Behavioral:  Positive for confusion and sleep disturbance.   All other systems reviewed and are negative.  PAST MEDICAL/SURGICAL HISTORY:  Past Medical History:  Diagnosis Date   Acid reflux    Anemia    Colon cancer ([place])    per patient, colon cancer more than 10 years ago s/p right hemicolectomy   Ex-cigarette smoker    Gout    Hyperlipemia    Hypertension    Paroxysmal atrial fibrillation ([place]) 2014   Prostate cancer ([place]) 08/08/2013   SEEDS 20 YEARS AGO   Prostate disease    Past Surgical History:  Procedure Laterality Date   BIOPSY  02/05/2021   Procedure: BIOPSY;  Surgeon: Montez Morita, Quillian Quince,  MD;  [facility];  Service: Gastroenterology;;   COLON SURGERY     Right hemicolectomy in the setting of colon cancer, per patient   ESOPHAGOGASTRODUODENOSCOPY (EGD) WITH PROPOFOL N/A 02/05/2021   Procedure: ESOPHAGOGASTRODUODENOSCOPY (EGD) WITH PROPOFOL;  Surgeon: Harvel Quale, MD;  [facility];  Service: Gastroenterology;  Laterality: N/A;   LEFT HEART CATHETERIZATION WITH CORONARY ANGIOGRAM N/A 07/21/2012   Procedure: LEFT HEART CATHETERIZATION WITH CORONARY ANGIOGRAM;  Surgeon: Troy Sine, MD;  Location: [facility] CATH LAB;  Service: Cardiovascular;  Laterality: N/A;   PACEMAKER INSERTION     PERMANENT PACEMAKER INSERTION N/A 07/22/2012   Procedure: PERMANENT PACEMAKER INSERTION;  Surgeon: Sanda Klein, MD;  Location: [facility] CATH LAB;  Service: Cardiovascular;  Laterality: N/A;   PROSTATE SURGERY      SOCIAL HISTORY:  Social History   Socioeconomic History   Marital status: Married    Spouse name: Not on file   Number of children: Not on file   Years of education: Not on file   Highest education level: Not on file  Occupational History   Not on file  Tobacco Use   Smoking status: Former    Types: Cigarettes    Quit date: 07/20/2002    Years since quitting: 18.6   Smokeless tobacco: Never  Vaping Use   Vaping Use: Never used  Substance and Sexual Activity   Alcohol use: No   Drug use: No   Sexual activity: Not on file  Other Topics Concern  Not on file  Social History Narrative   Not on file   Social Determinants of Health   Financial Resource Strain: Low Risk    Difficulty of Paying Living Expenses: Not hard at all  Food Insecurity: No Food Insecurity   Worried About Charity fundraiser in the Last Year: Never true   Arboriculturist in the Last Year: Never true  Transportation Needs: No Transportation Needs   Lack of Transportation (Medical): No   Lack of Transportation (Non-Medical): No  Physical Activity: Insufficiently Active    Days of Exercise per Week: 3 days   Minutes of Exercise per Session: 30 min  Stress: No Stress Concern Present   Feeling of Stress : Not at all  Social Connections: Moderately Integrated   Frequency of Communication with Friends and Family: More than three times a week   Frequency of Social Gatherings with Friends and Family: More than three times a week   Attends Religious Services: More than 4 times per year   Active Member of Clubs or Organizations: No   Attends Archivist Meetings: 1 to 4 times per year   Marital Status: Widowed  Human resources officer Violence: Not At Risk   Fear of Current or Ex-Partner: No   Emotionally Abused: No   Physically Abused: No   Sexually Abused: No    FAMILY HISTORY:  Family History  Problem Relation Age of Onset   Cancer Neg Hx     CURRENT MEDICATIONS:  Current Outpatient Medications  Medication Sig Dispense Refill   abiraterone acetate (ZYTIGA) 250 MG tablet Take 2 tablets (500 mg total) by mouth daily. Take on an empty stomach 1 hour before or 2 hours after a meal 120 tablet 3   acetaminophen (TYLENOL) 650 MG CR tablet Take 1,300 mg by mouth every 8 (eight) hours as needed for pain.     amLODipine (NORVASC) 10 MG tablet Take 10 mg by mouth daily.     ascorbic acid (VITAMIN C) 250 MG tablet TAKE ONE TABLET BY MOUTH THREE TIMES A DAY WITH MEALS - TAKE WITH IRON TO PROMOTE BETTER ABSORPTION     atorvastatin (LIPITOR) 20 MG tablet Take 1 tablet (20 mg total) by mouth daily at 6 PM. (Patient taking differently: Take 20 mg by mouth every evening.) 30 tablet 5   carvedilol (COREG) 25 MG tablet Take 50 mg by mouth 2 (two) times daily with a meal.     Cholecalciferol 25 MCG (1000 UT) tablet Take 1,000 Units by mouth daily.     diclofenac sodium (VOLTAREN) 1 % GEL Apply 2 g topically 4 (four) times daily. (Patient taking differently: Apply 2 g topically 4 (four) times daily as needed (pain).) 100 g 0   Ensure (ENSURE) Take 237 mLs by mouth daily as  needed (nutrition). Vanilla or chocolate     ferrous sulfate 324 MG TBEC Take 324 mg by mouth 2 (two) times daily.     fluticasone (FLONASE) 50 MCG/ACT nasal spray Place 2 sprays into both nostrils daily. (Patient taking differently: Place 2 sprays into both nostrils daily as needed for allergies.) 16 g 2   furosemide (LASIX) 40 MG tablet TAKE ONE TABLET BY MOUTH ONCE EVERY DAY FOR BLOOD PRESSURE AND FOR FLUID CONTROL     lisinopril (ZESTRIL) 40 MG tablet Take 1 tablet by mouth daily.     Menthol-Methyl Salicylate (THERA-GESIC) 0.5-15 % CREA APPLY MODERATE AMOUNT TOPICALLY 3 TIMES A DAY AS NEEDED FOR PAIN ( AVOID  FACE and EYES, [place] HANDS AFTER USING). ++  FOR BACK PAIN  ++     pantoprazole (PROTONIX) 40 MG tablet Take 1 tablet (40 mg total) by mouth daily. 30 tablet 3   potassium chloride SA (KLOR-CON) 20 MEQ tablet Take 20 mEq by mouth daily.     predniSONE (DELTASONE) 5 MG tablet Take 1 tablet (5 mg total) by mouth daily with breakfast. 30 tablet 6   rivaroxaban (XARELTO) 20 MG TABS tablet Take 1 tablet (20 mg total) by mouth daily with supper. 20 tablet 0   traMADol (ULTRAM) 50 MG tablet Take 1 tablet (50 mg total) by mouth 2 (two) times daily. 60 tablet 0   megestrol (MEGACE) 400 MG/10ML suspension Take 10 mLs (400 mg total) by mouth 2 (two) times daily. 480 mL 2   ondansetron (ZOFRAN-ODT) 8 MG disintegrating tablet Take 1 tablet (8 mg total) by mouth every 8 (eight) hours as needed for nausea or vomiting. (Patient not taking: Reported on 03/20/2021) 20 tablet 0   No current facility-administered medications for this visit.    ALLERGIES:  No Known Allergies  PHYSICAL EXAM:  Performance status (ECOG): 1 - Symptomatic but completely ambulatory  Vitals:   03/20/21 1000  BP: 115/74  Pulse: 67  Resp: 16  Temp: (!) 97.5 F (36.4 C)  SpO2: 100%   Wt Readings from Last 3 Encounters:  03/20/21 177 lb 0.5 oz (80.3 kg)  03/06/21 190 lb (86.2 kg)  02/26/21 179 lb (81.2 kg)   Physical  Exam Vitals reviewed.  Constitutional:      Appearance: Normal appearance.     Comments: In wheelchair  Cardiovascular:     Rate and Rhythm: Normal rate and regular rhythm.     Pulses: Normal pulses.     Heart sounds: Normal heart sounds.  Pulmonary:     Effort: Pulmonary effort is normal.     Breath sounds: Normal breath sounds.  Musculoskeletal:     Right lower leg: No edema.     Left lower leg: No edema.  Neurological:     General: No focal deficit present.     Mental Status: He is alert and oriented to person, place, and time.  Psychiatric:        Mood and Affect: Mood normal.        Behavior: Behavior normal.     LABORATORY DATA:  I have reviewed the labs as listed.  CBC Latest Ref Rng & Units 03/06/2021 02/26/2021 02/21/2021  WBC 4.0 - 10.5 K/uL 5.3 6.2 5.9  Hemoglobin 13.0 - 17.0 g/dL 11.7(L) 12.5(L) 11.4(L)  Hematocrit 39.0 - 52.0 % 33.3(L) 37.6(L) 32.2(L)  Platelets 150 - 400 K/uL 154 212 278   CMP Latest Ref Rng & Units 03/18/2021 03/06/2021 02/26/2021  Glucose 70 - 99 mg/dL 109(H) 101(H) 88  BUN 8 - 23 mg/dL 29(H) 38(H) 35(H)  Creatinine 0.61 - 1.24 mg/dL 1.70(H) 2.06(H) 1.69(H)  Sodium 135 - 145 mmol/L 132(L) 132(L) 135  Potassium 3.5 - 5.1 mmol/L 3.9 4.5 4.3  Chloride 98 - 111 mmol/L 104 103 106  CO2 22 - 32 mmol/L 22 19(L) 22  Calcium 8.9 - 10.3 mg/dL 8.9 8.8(L) 9.3  Total Protein 6.5 - 8.1 g/dL 6.9 6.6 7.1  Total Bilirubin 0.3 - 1.2 mg/dL 0.9 1.2 0.6  Alkaline Phos 38 - 126 U/L 47 45 46  AST 15 - 41 U/L 20 17 18   ALT 0 - 44 U/L 18 18 19     DIAGNOSTIC IMAGING:  I  have independently reviewed the scans and discussed with the patient. DG Chest 2 View  Result Date: 03/06/2021 CLINICAL DATA:  Intermittent weakness EXAM: CHEST - 2 VIEW COMPARISON:  02/19/2021 FINDINGS: Transverse diameter of heart is slightly increased. Thoracic aorta is tortuous and ectatic. Lung fields are clear of any infiltrates or pulmonary edema. Left hemidiaphragm is elevated. There is no  pleural effusion or pneumothorax. Pacemaker battery is seen in the left infraclavicular region. Degenerative changes are noted in the right shoulder and right AC joint. IMPRESSION: No active cardiopulmonary disease. Electronically Signed   By: Elmer Picker M.D.   On: 03/06/2021 12:36   CT HEAD WO CONTRAST  Result Date: 02/19/2021 CLINICAL DATA:  Trauma, status post fall. EXAM: CT HEAD WITHOUT CONTRAST TECHNIQUE: Contiguous axial images were obtained from the base of the skull through the vertex without intravenous contrast. COMPARISON:  Head CT 3 days ago 02/16/2021 FINDINGS: Brain: Unchanged atrophy and chronic small vessel ischemia. No intracranial hemorrhage, mass effect, or midline shift. No hydrocephalus. The basilar cisterns are patent. Tiny left inferior frontal parenchymal calcification is unchanged. No evidence of territorial infarct or acute ischemia. No extra-axial or intracranial fluid collection. Vascular: Atherosclerosis of skullbase vasculature without hyperdense vessel or abnormal calcification. Skull: No fracture or focal lesion. Sinuses/Orbits: No acute findings. Scattered mucous retention cysts in the maxillary sinuses. Bilateral cataract resection. Other: None. IMPRESSION: 1. No acute intracranial abnormality. No skull fracture. 2. Unchanged atrophy and chronic small vessel ischemia. Electronically Signed   By: Keith Rake M.D.   On: 02/19/2021 21:53   CT CERVICAL SPINE WO CONTRAST  Result Date: 02/19/2021 CLINICAL DATA:  Neck trauma.  Fall. EXAM: CT CERVICAL SPINE WITHOUT CONTRAST TECHNIQUE: Multidetector CT imaging of the cervical spine was performed without intravenous contrast. Multiplanar CT image reconstructions were also generated. COMPARISON:  None. FINDINGS: Alignment: No traumatic subluxation. 3-4 mm anterolisthesis of C7 on T1, 2-3 mm anterolisthesis of T1 on T2, both likely facet mediated. Skull base and vertebrae: No acute fracture. Advanced degenerative change at  C1-C2, osteophytes simulating fracture. Moderate pannus. The dens and skull base are intact. Soft tissues and spinal canal: No prevertebral fluid or swelling. No visible canal hematoma. Disc levels: Diffuse degenerative disc disease with disc space narrowing and endplate spurring at all levels, most prominently affecting C5-C6 and C6-C7. There is prominent multilevel facet hypertrophy. Upper chest: No acute or unexpected findings. Other: Carotid calcifications. IMPRESSION: 1. No acute fracture or subluxation of the cervical spine. 2. Advanced multilevel degenerative disc disease and facet hypertrophy. Electronically Signed   By: Keith Rake M.D.   On: 02/19/2021 21:56   DG Chest Port 1 View  Result Date: 02/19/2021 CLINICAL DATA:  near syncope EXAM: PORTABLE CHEST 1 VIEW.  Patient is rotated. COMPARISON:  Chest x-ray 09/16/2019.  CT angio chest 02/03/2021 FINDINGS: Left chest wall 2 lead pacemaker in similar position. The heart and mediastinal contours are unchanged given rotation. Aortic calcification. Biapical pleural/pulmonary scarring. No focal consolidation. No pulmonary edema. No pleural effusion. No pneumothorax. No acute osseous abnormality. IMPRESSION: No active disease. Electronically Signed   By: Iven Finn M.D.   On: 02/19/2021 21:23   CT Renal Stone Study  Result Date: 02/20/2021 CLINICAL DATA:  Urinary retention.  History of prostate cancer. EXAM: CT ABDOMEN AND PELVIS WITHOUT CONTRAST TECHNIQUE: Multidetector CT imaging of the abdomen and pelvis was performed following the standard protocol without IV contrast. COMPARISON:  02/03/2021. FINDINGS: Lower chest: The heart is normal in size and scattered coronary artery  calcifications are noted. Pacemaker leads are present in the heart. A 4 mm nodule is present in the right lower lobe, axial image 8. Hepatobiliary: The liver is within normal limits. Mild intrahepatic and extrahepatic biliary ductal dilatation is seen which is likely due to  patient's post cholecystectomy status. Pancreas: Unremarkable. No pancreatic ductal dilatation or surrounding inflammatory changes. Spleen: Normal in size without focal abnormality. Adrenals/Urinary Tract: There is redemonstration of a right adrenal mass measuring 3.2 cm, unchanged. The left adrenal gland is within normal limits. Scattered hypo and hyperdense structures are present in the kidneys bilaterally. Complex masslike lesions are noted in the kidneys bilaterally measuring 2.6 cm in the mid left kidney and 2.6 cm in the mid right kidney. No renal or ureteral calculus or obstructive uropathy bilaterally. The bladder is unremarkable. Stomach/Bowel: The stomach is nondistended. No bowel obstruction, free air or pneumatosis. A few scattered diverticular noted along the colon without evidence of diverticulitis. Right hemicolectomy changes are noted. Vascular/Lymphatic: Aortic atherosclerosis with ectasia of the distal abdominal aorta. 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 The common iliac artery on the left measures 1.8 cm. No abdominal or pelvic lymphadenopathy. Reproductive: The prostate gland is enlarged and contains multiple radiopaque brachytherapy seeds. Other: No free fluid. A small fat containing umbilical hernia is noted. There is a small right inguinal fat containing hernia. Musculoskeletal: Degenerative changes are present in the thoracolumbar spine and bilateral hips. A stable sclerotic lesion is noted in the L2 vertebral body on the right, possible bone island. IMPRESSION: 1. No renal or ureteral calculus or obstructive uropathy bilaterally. 2. Multiple hyperdense and hypodense lesions in the kidneys bilaterally. Complex masses are noted in the mid kidneys bilaterally, compatible with history of multifocal renal cell carcinoma and slightly increased in size from the prior exam. 3. Enlarged prostate gland containing brachytherapy seeds. 4. Right lower  lobe pulmonary nodule. No follow-up needed if patient is low-risk. Non-contrast chest CT can be considered in 12 months if patient is high-risk. This recommendation follows the consensus statement: Guidelines for Management of Incidental Pulmonary Nodules Detected on CT Images: From the Fleischner Society 2017; Radiology 2017; 284:228-243. 5. Aortic atherosclerosis. 6. Coronary artery calcifications. 7. Remaining chronic findings as described above. Electronically Signed   By: Brett Fairy M.D.   On: 02/20/2021 00:28     ASSESSMENT:  1.  Metastatic prostate cancer to the right adrenal gland: -Presentation to the ER with worsening right-sided back pain, which has been on and off for the last couple of years. - Some decrease in appetite with 6 pound weight loss in the last 6 months.  No hematuria or other B symptoms. - CT renal study on 05/03/2020 showed new right adrenal mass with bilateral renal lesions.  Indeterminate L2 vertebral bodies sclerotic lesion.  Low-attenuation lesions in the kidneys measure up to 2.3 cm on the right and were similar to scan from July 2021. - CT CAP on 06/12/2020 did not show any evidence of nodal metastasis or metastatic disease in the chest.  Right adrenal mass measures 4.2 x 3.8 cm exerts mass-effect upon the posterior and right lateral wall of the IVC.  Right kidney masses measuring 2.3 x 2.3 cm and 1.9 x 1.7 cm suspicious for RCC.  Upper pole of the left kidney lesion 2.2 x 2.0 cm and medial cortex of the inferior pole measuring 2 x 2 cm and another interpolar left kidney lesion measuring 1.3 x 1.5 cm. - Bone  scan on 06/13/2020 was negative. - Right adrenal biopsy consistent with prostatic adenocarcinoma - Degarelix on 07/16/2020, transition to Eligard 45 mg on 08/21/2020. - Abiraterone 100 mg daily/prednisone 5 mg daily started around 01/22/2021.   2.  Social/family history: - He lives at home with his daughter.  He walks with a cane. - He mows lawn with a riding mower.   He has a garden and gross vegetables.  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  Quit smoking 25 years ago. - No family history of malignancies that he is aware of.   PLAN:  1.  Metastatic prostate cancer to the right adrenal gland: - He is continuing Abiraterone 500 mg daily reasonably well.  He reports some fatigue which is hard to tell if it is coming from Abiraterone versus previously present. - We reviewed labs from 03/18/2020 which showed slight improvement in renal function with creatinine 1.7.  Calcium was normal.  PSA was 2.92, up from 2.45 (01/15/2021). - Abiraterone was started around 01/22/2021. - Recommend continuing Abiraterone 500 mg daily along with prednisone 5 mg daily. - We will plan to see him back in 4 weeks for follow-up.  We will repeat PSA at that time. - We have discussed prognosis of metastatic prostate cancer again today.  We have also discussed best supportive care in the form of hospice.  Patient would like to continue treatment at this time.  However because of his fatigue, I have recommended home physical therapy. - He reports some epigastric pain.  He will continue Protonix and take Maalox as needed.     2.  Right sided back pain: - This is stable.  Continue tramadol 50 mg twice daily as needed.  3.  Urinary urgency: - Continue Rapaflo.  4.  Decreased appetite: - He lost 2 pounds.  Continue Megace twice daily. - Continue nutritional supplements.   Orders placed this encounter:  No orders of the defined types were placed in this encounter.    Derek Jack, MD [place] [phone]   I, Thana Ates, am acting as a scribe for Dr. Derek Jack.  I, Derek Jack MD, have reviewed the above documentation for accuracy and completeness, and I agree with the above.

## 2021-03-20 NOTE — Progress Notes (Signed)
Patient is taking Zytiga as prescribed.  He has not missed any doses and reports no side effects at this time.   

## 2021-03-20 NOTE — Patient Instructions (Addendum)
[place] at [facility] Discharge Instructions   You were seen and examined today by Dr. Delton Coombes.  He reviewed your lab work which is normal/stable.   Continue Zytiga as prescribed.  We will make a referral for home health for physical therapy and strengthening exercises.   STOP taking omeprazole and continue pantoprazole.  A new prescription has been sent to your pharmacy.   Return as scheduled for lab work and office visit.    Thank you for choosing [place] at [facility] to provide your oncology and hematology care.  To afford each patient quality time with our provider, please arrive at least 15 minutes before your scheduled appointment time.   If you have a lab appointment with the [place] please come in thru the Main Entrance and check in at the main information desk.  You need to re-schedule your appointment should you arrive 10 or more minutes late.  We strive to give you quality time with our providers, and arriving late affects you and other patients whose appointments are after yours.  Also, if you no show three or more times for appointments you may be dismissed from the clinic at the providers discretion.     Again, thank you for choosing [facility].  Our hope is that these requests will decrease the amount of time that you wait before being seen by our physicians.       _____________________________________________________________  Should you have questions after your visit to [facility], please contact our office at [phone] and follow the prompts.  Our office hours are 8:00 a.m. and 4:30 p.m. Monday - Friday.  Please note that voicemails left after 4:00 p.m. may not be returned until the following business day.  We are closed weekends and major holidays.  You do have access to a nurse 24-7, just call the main number to the clinic [phone] and do not press any options, hold on the  line and a nurse will answer the phone.    For prescription refill requests, have your pharmacy contact our office and allow 72 hours.    Due to Covid, you will need to wear a mask upon entering the hospital. If you do not have a mask, a mask will be given to you at the Main Entrance upon arrival. For doctor visits, patients may have 1 support person age 14 or older with them. For treatment visits, patients can not have anyone with them due to social distancing guidelines and our immunocompromised population.

## 2021-03-20 NOTE — Telephone Encounter (Signed)
Home health referral placed for RN assessment/PT/OT.  Spoke with Romualdo Bolk, RN of [facility].  She is unable to accept any more patients with week with [facility] Medicare coverage. She will be able to accept pt on Tuesday, 03/25/2021, and advised me to call her back at that time. I will f/u as instructed for referral.

## 2021-03-26 ENCOUNTER — Other Ambulatory Visit: Payer: Self-pay

## 2021-03-26 ENCOUNTER — Encounter (HOSPITAL_COMMUNITY): Payer: Self-pay | Admitting: Hematology

## 2021-03-26 ENCOUNTER — Encounter (HOSPITAL_COMMUNITY): Payer: Self-pay | Admitting: Pharmacy Technician

## 2021-03-26 ENCOUNTER — Emergency Department (HOSPITAL_COMMUNITY): Payer: No Typology Code available for payment source

## 2021-03-26 ENCOUNTER — Inpatient Hospital Stay (HOSPITAL_COMMUNITY)
Admission: EM | Admit: 2021-03-26 | Discharge: 2021-03-28 | DRG: 312 | Disposition: A | Discharge status: Home Health | Payer: No Typology Code available for payment source | Attending: Internal Medicine | Admitting: Internal Medicine

## 2021-03-26 DIAGNOSIS — Z7901 Long term (current) use of anticoagulants: Secondary | ICD-10-CM

## 2021-03-26 DIAGNOSIS — Z20822 Contact with and (suspected) exposure to covid-19: Secondary | ICD-10-CM | POA: Diagnosis present

## 2021-03-26 DIAGNOSIS — Z9049 Acquired absence of other specified parts of digestive tract: Secondary | ICD-10-CM

## 2021-03-26 DIAGNOSIS — N179 Acute kidney failure, unspecified: Secondary | ICD-10-CM | POA: Diagnosis not present

## 2021-03-26 DIAGNOSIS — I951 Orthostatic hypotension: Principal | ICD-10-CM | POA: Diagnosis present

## 2021-03-26 DIAGNOSIS — Z87891 Personal history of nicotine dependence: Secondary | ICD-10-CM

## 2021-03-26 DIAGNOSIS — E785 Hyperlipidemia, unspecified: Secondary | ICD-10-CM | POA: Diagnosis present

## 2021-03-26 DIAGNOSIS — I129 Hypertensive chronic kidney disease with stage 1 through stage 4 chronic kidney disease, or unspecified chronic kidney disease: Secondary | ICD-10-CM | POA: Diagnosis present

## 2021-03-26 DIAGNOSIS — I10 Essential (primary) hypertension: Secondary | ICD-10-CM | POA: Diagnosis not present

## 2021-03-26 DIAGNOSIS — Z85038 Personal history of other malignant neoplasm of large intestine: Secondary | ICD-10-CM

## 2021-03-26 DIAGNOSIS — C7972 Secondary malignant neoplasm of left adrenal gland: Secondary | ICD-10-CM | POA: Diagnosis present

## 2021-03-26 DIAGNOSIS — Z79818 Long term (current) use of other agents affecting estrogen receptors and estrogen levels: Secondary | ICD-10-CM

## 2021-03-26 DIAGNOSIS — I959 Hypotension, unspecified: Secondary | ICD-10-CM | POA: Diagnosis present

## 2021-03-26 DIAGNOSIS — Z79891 Long term (current) use of opiate analgesic: Secondary | ICD-10-CM

## 2021-03-26 DIAGNOSIS — E278 Other specified disorders of adrenal gland: Secondary | ICD-10-CM | POA: Diagnosis present

## 2021-03-26 DIAGNOSIS — D509 Iron deficiency anemia, unspecified: Secondary | ICD-10-CM | POA: Diagnosis present

## 2021-03-26 DIAGNOSIS — R55 Syncope and collapse: Secondary | ICD-10-CM | POA: Diagnosis present

## 2021-03-26 DIAGNOSIS — Z7952 Long term (current) use of systemic steroids: Secondary | ICD-10-CM

## 2021-03-26 DIAGNOSIS — Z79899 Other long term (current) drug therapy: Secondary | ICD-10-CM

## 2021-03-26 DIAGNOSIS — R112 Nausea with vomiting, unspecified: Secondary | ICD-10-CM

## 2021-03-26 DIAGNOSIS — N1832 Chronic kidney disease, stage 3b: Secondary | ICD-10-CM | POA: Diagnosis not present

## 2021-03-26 DIAGNOSIS — M109 Gout, unspecified: Secondary | ICD-10-CM | POA: Diagnosis present

## 2021-03-26 DIAGNOSIS — C7971 Secondary malignant neoplasm of right adrenal gland: Secondary | ICD-10-CM | POA: Diagnosis present

## 2021-03-26 DIAGNOSIS — E872 Acidosis, unspecified: Secondary | ICD-10-CM | POA: Diagnosis present

## 2021-03-26 DIAGNOSIS — N2889 Other specified disorders of kidney and ureter: Secondary | ICD-10-CM | POA: Diagnosis present

## 2021-03-26 DIAGNOSIS — Z66 Do not resuscitate: Secondary | ICD-10-CM | POA: Diagnosis present

## 2021-03-26 DIAGNOSIS — N183 Chronic kidney disease, stage 3 unspecified: Secondary | ICD-10-CM | POA: Insufficient documentation

## 2021-03-26 DIAGNOSIS — I480 Paroxysmal atrial fibrillation: Secondary | ICD-10-CM | POA: Diagnosis present

## 2021-03-26 DIAGNOSIS — N3000 Acute cystitis without hematuria: Secondary | ICD-10-CM

## 2021-03-26 DIAGNOSIS — N390 Urinary tract infection, site not specified: Secondary | ICD-10-CM

## 2021-03-26 DIAGNOSIS — Z8546 Personal history of malignant neoplasm of prostate: Secondary | ICD-10-CM

## 2021-03-26 DIAGNOSIS — N189 Chronic kidney disease, unspecified: Secondary | ICD-10-CM

## 2021-03-26 DIAGNOSIS — Z8679 Personal history of other diseases of the circulatory system: Secondary | ICD-10-CM

## 2021-03-26 DIAGNOSIS — K219 Gastro-esophageal reflux disease without esophagitis: Secondary | ICD-10-CM | POA: Diagnosis present

## 2021-03-26 HISTORY — DX: Urinary tract infection, site not specified: N39.0

## 2021-03-26 LAB — URINALYSIS, ROUTINE W REFLEX MICROSCOPIC
Bilirubin Urine: NEGATIVE
Glucose, UA: NEGATIVE mg/dL
Ketones, ur: NEGATIVE mg/dL
Nitrite: NEGATIVE
Protein, ur: 30 mg/dL — AB
Specific Gravity, Urine: 1.01 (ref 1.005–1.030)
WBC, UA: >50 WBC/hpf — ABNORMAL HIGH (ref 0–5)
pH: 5 (ref 5.0–8.0)

## 2021-03-26 LAB — CBC WITH DIFFERENTIAL/PLATELET
Abs Immature Granulocytes: 0.07 10*3/uL (ref 0.00–0.07)
Basophils Absolute: 0 10*3/uL (ref 0.0–0.1)
Basophils Relative: 0 %
Eosinophils Absolute: 0 10*3/uL (ref 0.0–0.5)
Eosinophils Relative: 0 %
HCT: 34.9 % — ABNORMAL LOW (ref 39.0–52.0)
Hemoglobin: 11.6 g/dL — ABNORMAL LOW (ref 13.0–17.0)
Immature Granulocytes: 1 %
Lymphocytes Relative: 20 %
Lymphs Abs: 1.8 10*3/uL (ref 0.7–4.0)
MCH: 34.6 pg — ABNORMAL HIGH (ref 26.0–34.0)
MCHC: 33.2 g/dL (ref 30.0–36.0)
MCV: 104.2 fL — ABNORMAL HIGH (ref 80.0–100.0)
Monocytes Absolute: 0.9 10*3/uL (ref 0.1–1.0)
Monocytes Relative: 10 %
Neutro Abs: 6.1 10*3/uL (ref 1.7–7.7)
Neutrophils Relative %: 69 %
Platelets: 197 10*3/uL (ref 150–400)
RBC: 3.35 MIL/uL — ABNORMAL LOW (ref 4.22–5.81)
RDW: 13.8 % (ref 11.5–15.5)
WBC: 8.9 10*3/uL (ref 4.0–10.5)
nRBC: 0 % (ref 0.0–0.2)

## 2021-03-26 LAB — COMPREHENSIVE METABOLIC PANEL
ALT: 15 U/L (ref 0–44)
AST: 18 U/L (ref 15–41)
Albumin: 3.1 g/dL — ABNORMAL LOW (ref 3.5–5.0)
Alkaline Phosphatase: 38 U/L (ref 38–126)
Anion gap: 10 (ref 5–15)
BUN: 29 mg/dL — ABNORMAL HIGH (ref 8–23)
CO2: 19 mmol/L — ABNORMAL LOW (ref 22–32)
Calcium: 9 mg/dL (ref 8.9–10.3)
Chloride: 104 mmol/L (ref 98–111)
Creatinine, Ser: 2.07 mg/dL — ABNORMAL HIGH (ref 0.61–1.24)
GFR, Estimated: 29 mL/min — ABNORMAL LOW (ref >60)
Glucose, Bld: 95 mg/dL (ref 70–99)
Potassium: 4 mmol/L (ref 3.5–5.1)
Sodium: 133 mmol/L — ABNORMAL LOW (ref 135–145)
Total Bilirubin: 1.3 mg/dL — ABNORMAL HIGH (ref 0.3–1.2)
Total Protein: 6.4 g/dL — ABNORMAL LOW (ref 6.5–8.1)

## 2021-03-26 LAB — RESP PANEL BY RT-PCR (FLU A&B, COVID) ARPGX2
Influenza A by PCR: NEGATIVE
Influenza B by PCR: NEGATIVE
SARS Coronavirus 2 by RT PCR: NEGATIVE

## 2021-03-26 LAB — LACTIC ACID, PLASMA
Lactic Acid, Venous: 2.2 mmol/L (ref 0.5–1.9)
Lactic Acid, Venous: 1.8 mmol/L (ref 0.5–1.9)

## 2021-03-26 LAB — TROPONIN I (HIGH SENSITIVITY)
Troponin I (High Sensitivity): 43 ng/L — ABNORMAL HIGH (ref <18)
Troponin I (High Sensitivity): 43 ng/L — ABNORMAL HIGH (ref <18)

## 2021-03-26 MED ORDER — SODIUM CHLORIDE 0.9 % IV SOLN: INTRAVENOUS | Status: DC

## 2021-03-26 MED ORDER — RIVAROXABAN 20 MG PO TABS: 20.0000 mg | ORAL_TABLET | Freq: Every day | ORAL | Status: DC

## 2021-03-26 MED ORDER — ONDANSETRON HCL 4 MG/2ML IJ SOLN: 4.0000 mg | Freq: Four times a day (QID) | INTRAMUSCULAR | Status: DC | PRN

## 2021-03-26 MED ORDER — ACETAMINOPHEN 325 MG PO TABS: 650.0000 mg | ORAL_TABLET | Freq: Four times a day (QID) | ORAL | Status: DC | PRN

## 2021-03-26 MED ORDER — SODIUM CHLORIDE 0.9 % IV BOLUS
1000.0000 mL | Freq: Once | INTRAVENOUS | Status: AC
  Administered 2021-03-26: 1000 mL via INTRAVENOUS

## 2021-03-26 MED ORDER — ONDANSETRON HCL 4 MG PO TABS: 4.0000 mg | ORAL_TABLET | Freq: Four times a day (QID) | ORAL | Status: DC | PRN

## 2021-03-26 MED ORDER — SODIUM CHLORIDE 0.9 % IV SOLN
1.0000 g | Freq: Once | INTRAVENOUS | Status: AC
  Administered 2021-03-27: 1 g via INTRAVENOUS
  Filled 2021-03-26: qty 10

## 2021-03-26 MED ORDER — POLYETHYLENE GLYCOL 3350 17 G PO PACK: 17.0000 g | PACK | Freq: Every day | ORAL | Status: DC | PRN

## 2021-03-26 MED ORDER — ONDANSETRON HCL 4 MG/2ML IJ SOLN
4.0000 mg | Freq: Once | INTRAMUSCULAR | Status: AC
  Administered 2021-03-26: 4 mg via INTRAVENOUS
  Filled 2021-03-26: qty 2

## 2021-03-26 MED ORDER — SODIUM CHLORIDE 0.9 % IV BOLUS
500.0000 mL | Freq: Once | INTRAVENOUS | Status: AC
Start: 2021-03-26 — End: 2021-03-26
  Administered 2021-03-26: 500 mL via INTRAVENOUS

## 2021-03-26 MED ORDER — ACETAMINOPHEN 650 MG RE SUPP: 650.0000 mg | Freq: Four times a day (QID) | RECTAL | Status: DC | PRN

## 2021-03-26 MED ORDER — ENSURE ENLIVE PO LIQD: 237.0000 mL | Freq: Every day | ORAL | Status: DC | PRN

## 2021-03-26 MED ORDER — MEGESTROL ACETATE 400 MG/10ML PO SUSP
400.0000 mg | Freq: Two times a day (BID) | ORAL | Status: DC
  Administered 2021-03-26 – 2021-03-28 (×3): 400 mg via ORAL
  Filled 2021-03-26 (×4): qty 10

## 2021-03-26 MED ORDER — PANTOPRAZOLE SODIUM 40 MG PO TBEC
40.0000 mg | DELAYED_RELEASE_TABLET | Freq: Every day | ORAL | Status: DC
  Administered 2021-03-26 – 2021-03-28 (×3): 40 mg via ORAL
  Filled 2021-03-26 (×3): qty 1

## 2021-03-26 MED ORDER — SODIUM CHLORIDE 0.9 % IV SOLN
1.0000 g | Freq: Once | INTRAVENOUS | Status: AC
  Administered 2021-03-26: 1 g via INTRAVENOUS
  Filled 2021-03-26: qty 10

## 2021-03-26 MED ORDER — TAMSULOSIN HCL 0.4 MG PO CAPS
0.4000 mg | ORAL_CAPSULE | Freq: Every day | ORAL | Status: DC
  Administered 2021-03-27 – 2021-03-28 (×2): 0.4 mg via ORAL
  Filled 2021-03-26 (×2): qty 1

## 2021-03-26 MED ORDER — ENOXAPARIN SODIUM 40 MG/0.4ML IJ SOSY: 40.0000 mg | PREFILLED_SYRINGE | INTRAMUSCULAR | Status: DC

## 2021-03-26 MED ORDER — HEPARIN SODIUM (PORCINE) 5000 UNIT/ML IJ SOLN: 5000.0000 [IU] | Freq: Three times a day (TID) | INTRAMUSCULAR | Status: DC

## 2021-03-26 MED ORDER — RIVAROXABAN 15 MG PO TABS
15.0000 mg | ORAL_TABLET | Freq: Every day | ORAL | Status: DC
  Administered 2021-03-27: 15 mg via ORAL
  Filled 2021-03-26: qty 1

## 2021-03-26 MED ORDER — PREDNISONE 5 MG PO TABS
5.0000 mg | ORAL_TABLET | Freq: Every day | ORAL | Status: DC
  Administered 2021-03-27 – 2021-03-28 (×2): 5 mg via ORAL
  Filled 2021-03-26 (×2): qty 1

## 2021-03-26 MED ORDER — ABIRATERONE ACETATE 250 MG PO TABS: 500.0000 mg | ORAL_TABLET | Freq: Every day | ORAL | Status: DC

## 2021-03-26 NOTE — Assessment & Plan Note (Addendum)
Rate controlled.  Holding carvedilol due to hypotension during hospitalization will consider resuming Coreg at lower dose and continue home  Xarelto.

## 2021-03-26 NOTE — ED Notes (Signed)
Pt began vomiting, became near syncopal. BP in the 70's. PA made aware.

## 2021-03-26 NOTE — ED Provider Notes (Signed)
[facility] EMERGENCY DEPARTMENT Provider Note   CSN: [id number] Arrival date & time: 03/26/21  1001     History  Chief Complaint  Patient presents with   Loss of Consciousness   Emesis    Derrick Long is a 88 y.o. male with a history significant for hypertension, iron deficiency anemia, history of paroxysmal atrial fibrillation on Xarelto, also patient is being treated for metastatic prostate cancer under the care of Dr. Delton Coombes presenting for evaluation of syncope.  He was sitting at the table this morning around 9 AM when he started to tremble according to the daughter at the bedside he tried to stand up but became lightheaded, sat back down and promptly passed out.  He did not fall during this event, daughter describing he laid his head on the table and was not responsive for less than 5 seconds.  Afterwards he was fully awake and normal with his mentation.  The history is provided by the patient and a relative (Daughter at bedside).      Home Medications Prior to Admission medications   Medication Sig Start Date End Date Taking? Authorizing Provider  abiraterone acetate (ZYTIGA) 250 MG tablet Take 2 tablets (500 mg total) by mouth daily. Take on an empty stomach 1 hour before or 2 hours after a meal 02/26/21  Yes Derek Jack, MD  acetaminophen (TYLENOL) 650 MG CR tablet Take 1,300 mg by mouth every 8 (eight) hours as needed for pain.   Yes [provider]  amLODipine (NORVASC) 10 MG tablet Take 10 mg by mouth daily.   Yes [provider]  ascorbic acid (VITAMIN C) 250 MG tablet TAKE ONE TABLET BY MOUTH THREE TIMES A DAY WITH MEALS - TAKE WITH IRON TO PROMOTE BETTER ABSORPTION 03/18/21  Yes [provider]  atorvastatin (LIPITOR) 20 MG tablet Take 1 tablet (20 mg total) by mouth daily at 6 PM. Patient taking differently: Take 20 mg by mouth every evening. 07/23/12  Yes Rosita Fire, Brittainy M, PA-C  carvedilol (COREG) 25 MG tablet Take 50 mg by  mouth 2 (two) times daily with a meal.   Yes [provider]  Cholecalciferol 25 MCG (1000 UT) tablet Take 1,000 Units by mouth daily.   Yes [provider]  diclofenac sodium (VOLTAREN) 1 % GEL Apply 2 g topically 4 (four) times daily. Patient taking differently: Apply 2 g topically 4 (four) times daily as needed (pain). 06/19/17  Yes Rancour, Annie Main, MD  Ensure (ENSURE) Take 237 mLs by mouth daily as needed (nutrition). Vanilla or chocolate   Yes [provider]  ferrous sulfate 324 MG TBEC Take 324 mg by mouth 2 (two) times daily.   Yes [provider]  fluticasone (FLONASE) 50 MCG/ACT nasal spray Place 2 sprays into both nostrils daily. Patient taking differently: Place 2 sprays into both nostrils daily as needed for allergies. 02/15/16  Yes Ward, Cyril Mourning N, DO  furosemide (LASIX) 40 MG tablet TAKE ONE TABLET BY MOUTH ONCE EVERY DAY FOR BLOOD PRESSURE AND FOR FLUID CONTROL 03/18/21  Yes [provider]  lisinopril (ZESTRIL) 40 MG tablet Take 1 tablet by mouth daily. 03/18/21  Yes [provider]  megestrol (MEGACE) 400 MG/10ML suspension Take 10 mLs (400 mg total) by mouth 2 (two) times daily. 03/20/21  Yes Derek Jack, MD  pantoprazole (PROTONIX) 40 MG tablet Take 1 tablet (40 mg total) by mouth daily. 03/20/21  Yes Derek Jack, MD  potassium chloride SA (KLOR-CON) 20 MEQ tablet Take 20  mEq by mouth daily. 02/28/20  Yes [provider]  predniSONE (DELTASONE) 5 MG tablet Take 1 tablet (5 mg total) by mouth daily with breakfast. 01/15/21  Yes Derek Jack, MD  rivaroxaban (XARELTO) 20 MG TABS tablet Take 1 tablet (20 mg total) by mouth daily with supper. 10/30/13  Yes Croitoru, Mihai, MD  tamsulosin (FLOMAX) 0.4 MG CAPS capsule Take 0.4 mg by mouth daily.   Yes [provider]  traMADol (ULTRAM) 50 MG tablet Take 1 tablet (50 mg total) by mouth 2 (two) times daily. 02/26/21  Yes Derek Jack, MD   ondansetron (ZOFRAN-ODT) 8 MG disintegrating tablet Take 1 tablet (8 mg total) by mouth every 8 (eight) hours as needed for nausea or vomiting. Patient not taking: Reported on 03/20/2021 02/05/21   Barton Dubois, MD      Allergies    Patient has no known allergies.    Review of Systems   Review of Systems  Constitutional:  Negative for chills and fever.  HENT:  Negative for congestion and sore throat.   Eyes: Negative.   Respiratory:  Negative for chest tightness and shortness of breath.   Cardiovascular:  Positive for chest pain. Negative for palpitations and leg swelling.  Gastrointestinal:  Positive for nausea and vomiting. Negative for abdominal pain.  Genitourinary: Negative.   Musculoskeletal:  Negative for arthralgias, joint swelling and neck pain.  Skin: Negative.  Negative for rash and wound.  Neurological:  Positive for syncope. Negative for dizziness, weakness, light-headedness, numbness and headaches.  Psychiatric/Behavioral: Negative.     Physical Exam Updated Vital Signs BP (!) 105/92    Pulse (!) 54    Temp 98.5 F (36.9 C) (Oral)    Resp (!) 21    Ht 6\' 1"  (1.854 m)    Wt 80.7 kg    SpO2 99%    BMI 23.48 kg/m  Physical Exam  ED Results / Procedures / Treatments   Labs (all labs ordered are listed, but only abnormal results are displayed) Labs Reviewed  CBC WITH DIFFERENTIAL/PLATELET - Abnormal; Notable for the following components:      Result Value   RBC 3.35 (*)    Hemoglobin 11.6 (*)    HCT 34.9 (*)    MCV 104.2 (*)    MCH 34.6 (*)    All other components within normal limits  COMPREHENSIVE METABOLIC PANEL - Abnormal; Notable for the following components:   Sodium 133 (*)    CO2 19 (*)    BUN 29 (*)    Creatinine, Ser 2.07 (*)    Total Protein 6.4 (*)    Albumin 3.1 (*)    Total Bilirubin 1.3 (*)    GFR, Estimated 29 (*)    All other components within normal limits  URINALYSIS, ROUTINE W REFLEX MICROSCOPIC - Abnormal; Notable for the following  components:   APPearance CLOUDY (*)    Hgb urine dipstick MODERATE (*)    Protein, ur 30 (*)    Leukocytes,Ua LARGE (*)    WBC, UA >50 (*)    Bacteria, UA RARE (*)    All other components within normal limits  LACTIC ACID, PLASMA - Abnormal; Notable for the following components:   Lactic Acid, Venous 2.2 (*)    All other components within normal limits  TROPONIN I (HIGH SENSITIVITY) - Abnormal; Notable for the following components:   Troponin I (High Sensitivity) 43 (*)    All other components within normal limits  TROPONIN I (HIGH SENSITIVITY) - Abnormal;  Notable for the following components:   Troponin I (High Sensitivity) 43 (*)    All other components within normal limits  RESP PANEL BY RT-PCR (FLU A&B, COVID) ARPGX2  CULTURE, BLOOD (ROUTINE X 2)  CULTURE, BLOOD (ROUTINE X 2)  URINE CULTURE  LACTIC ACID, PLASMA    EKG EKG Interpretation  Date/Time:  Wednesday March 26 2021 16:30:50 EST Ventricular Rate:  62 PR Interval:  319 QRS Duration: 97 QT Interval:  457 QTC Calculation: 465 R Axis:   17 Text Interpretation: Ventricular-paced complexes No further rhythm analysis attempted due to paced rhythm Prolonged PR interval Anteroseptal infarct, old Repol abnrm, global ischemia, diffuse leads t wave inversions more pronounced than prior March 27, 2022 Confirmed by Aletta Edouard [phone]) on 03/26/2021 4:57:33 PM  Radiology CT Head Wo Contrast  Result Date: 03/26/2021 CLINICAL DATA:  Syncope/presyncope, cerebrovascular cause suspected EXAM: CT HEAD WITHOUT CONTRAST TECHNIQUE: Contiguous axial images were obtained from the base of the skull through the vertex without intravenous contrast. RADIATION DOSE REDUCTION: This exam was performed according to the departmental dose-optimization program which includes automated exposure control, adjustment of the mA and/or kV according to patient size and/or use of iterative reconstruction technique. COMPARISON:  02/19/2021. FINDINGS: Brain: No  evidence of acute infarction, hemorrhage, hydrocephalus, extra-axial collection or mass lesion/mass effect. Similar patchy white matter hypoattenuation, nonspecific but bowed chronic microvascular ischemic disease. Similar atrophy. Vascular: No hyperdense vessel identified. Calcific intracranial atherosclerosis. Skull: No acute fracture. Sinuses/Orbits: Clear visualized sinuses.  Unremarkable orbits. Other: No mastoid effusions. IMPRESSION: 1. No evidence of acute intracranial abnormality. 2. Similar chronic microvascular ischemic disease and cerebral atrophy (ICD10-G31.9). Electronically Signed   By: Margaretha Sheffield M.D.   On: 03/26/2021 15:46   DG Chest Port 1 View  Result Date: 03/26/2021 CLINICAL DATA:  Syncope and weakness. EXAM: PORTABLE CHEST 1 VIEW COMPARISON:  03/06/2021 and CT chest 02/03/2021. FINDINGS: Trachea is midline. Heart size stable. Pacemaker lead tips are in the right atrium and right ventricle. Thoracic aorta is calcified. Minimal bibasilar linear scarring. No airspace consolidation or pleural fluid. IMPRESSION: No acute findings. Electronically Signed   By: Lorin Picket M.D.   On: 03/26/2021 11:23    Procedures Procedures    Medications Ordered in ED Medications  sodium chloride 0.9 % bolus 500 mL (500 mLs Intravenous New Bag/Given 03/26/21 1153)  sodium chloride 0.9 % bolus 1,000 mL (1,000 mLs Intravenous New Bag/Given 03/26/21 1615)  ondansetron (ZOFRAN) injection 4 mg (4 mg Intravenous Given 03/26/21 1636)  cefTRIAXone (ROCEPHIN) 1 g in sodium chloride 0.9 % 100 mL IVPB (1 g Intravenous New Bag/Given 03/26/21 1635)    ED Course/ Medical Decision Making/ A&P Clinical Course as of 03/26/21 1751  Wed Mar 26, 2021  1130 Call from [place] pacemaker interrogation - no ectopy, no abnormal findings with pacemaker this am. [JI]    Clinical Course User Index [JI] Evalee Jefferson, PA-C                           Medical Decision Making Patient with syncopal episode prior to arrival  now asymptomatic.  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  No fall or injury with today's event.  Amount and/or Complexity of Data Reviewed Independent Historian: caregiver    Details: Patient lives with his daughter who provides additional medical information. Labs: ordered. Radiology: ordered.    Details: Chest x-ray is negative for acute disease, CT head is  negative for trauma, subdural, subacute stroke. ECG/medicine tests: ordered.    Details: Paced rhythm.  Risk Decision regarding hospitalization. Diagnosis or treatment significantly limited by social determinants of health. Risk Details: Patient with metastatic prostate cancer, syncopal event this morning.  His pacemaker was interrogated, no ectopy or event suggesting cardiac source of syncope.  He was given IV fluids here.  He was given Zofran.  He has been afebrile here.  He initially presented with hypotension but afebrile, and without tachycardia.  Initially doubted this represented sepsis, however lactate was obtained and it was elevated at 2.2.  He had a normal WBC count, borderline anemic with a hemoglobin 11.6.  His creatinine is 2.07, obviously elevated but consistent with prior creatinines.  Additionally he has a small elevation in his troponin, delta troponins are flat at 43 and this number is consistent with prior troponins, in fact less then prior measurements, I suspect this is his baseline.  He was feeling significantly improved towards the end of his visit.  The plan was to let him go home if he continued to be symptom-free and his second troponin was negative.  He was motivated to go home.  He was ambulated without difficulty.  However we also offered him a snack prior to discharge home.  As soon as he ate a graham cracker and had some p.o. fluid he probably vomited and had a significant transient drop in his blood pressure.  He did not pass out but he did get  lightheaded.  Patient would be best admitted to the hospital given his persistent symptoms with trial of p.o. intake.  He does have an equivocal urinalysis with cloudy urine, moderate hemoglobin and WBCs but is nitrite negative and negative bacteria.  A urine culture is ordered.  Blood cultures were also collected.  He was given an IV dose of Rocephin and additional IV fluids have been ordered.  Urine culture was also ordered.  Patient discussed with Dr. Denton Brick who accepts patient for admission.           Final Clinical Impression(s) / ED Diagnoses Final diagnoses:  Syncope, unspecified syncope type  Hypotension, unspecified hypotension type  Nausea and vomiting, unspecified vomiting type    Rx / DC Orders ED Discharge Orders     None         Landis Martins 03/26/21 1751    Milton Ferguson, MD 03/27/21 [phone]

## 2021-03-26 NOTE — H&P (Addendum)
History and Physical    Derrick Long:[id number] DOB: Jul 02, 1926 DOA: 03/26/2021  PCP: [facility] [place]   Patient coming from: Home  I have personally briefly reviewed patient's old medical records in [place]  Chief Complaint: Syncope  HPI: Derrick Long is a 88 y.o. male with medical history significant for stage IV prostate cancer, atrial fibrillation, hypertension. Patient was brought to the ED via EMS for parts of the syncopal event.  Patient daughter reported that patient started trembling, tried to stand from sitting position, became lightheaded sat back down and passed out.  He did not hit his head.  He was out for maybe 5 seconds.  Denies any change in his oral intake.  No vomiting no loose stools.  No chest pain no difficulty breathing no leg swelling.  He is on Lasix, lisinopril and carvedilol and reports compliance.  No confusion or change in mental status. Reports recent urinary frequency, and change in odor of his urine. EMS reports systolic in the 38H.  ED Course: Afebrile temperature 97.9.  Heart rate 50s to 60s.  Respiratory rate 17-24. UA suggestive of UTI with positive leukocytes. Creatinine 2.07. Troponin 43 x 2.  Lactic acid acid 2.2 > 1.8 Pacemaker interrogated in the ED- no ectopy or event. Patient was feeling better after fluids, plan was to go home, but after eating a snack, patient vomited again, came lightheaded with drop in blood pressure to 70s.  He did not pass out. 1.5 L given.  Admission requested.  Review of Systems: As per HPI all other systems reviewed and negative.  Past Medical History:  Diagnosis Date   Acid reflux    Anemia    Colon cancer ([place])    per patient, colon cancer more than 10 years ago s/p right hemicolectomy   Ex-cigarette smoker    Gout    Hyperlipemia    Hypertension    Paroxysmal atrial fibrillation ([place]) 2014   Prostate cancer ([place]) 08/08/2013   SEEDS 20 YEARS AGO   Prostate disease     Past  Surgical History:  Procedure Laterality Date   BIOPSY  02/05/2021   Procedure: BIOPSY;  Surgeon: Montez Morita, Quillian Quince, MD;  Location: [facility];  Service: Gastroenterology;;   COLON SURGERY     Right hemicolectomy in the setting of colon cancer, per patient   ESOPHAGOGASTRODUODENOSCOPY (EGD) WITH PROPOFOL N/A 02/05/2021   Procedure: ESOPHAGOGASTRODUODENOSCOPY (EGD) WITH PROPOFOL;  Surgeon: Harvel Quale, MD;  Location: [facility];  Service: Gastroenterology;  Laterality: N/A;   LEFT HEART CATHETERIZATION WITH CORONARY ANGIOGRAM N/A 07/21/2012   Procedure: LEFT HEART CATHETERIZATION WITH CORONARY ANGIOGRAM;  Surgeon: Troy Sine, MD;  Location[facility] CATH LAB;  Service: Cardiovascular;  Laterality: N/A;   PACEMAKER INSERTION     PERMANENT PACEMAKER INSERTION N/A 07/22/2012   Procedure: PERMANENT PACEMAKER INSERTION;  Surgeon: Sanda Klein, MD;  Location: [facility] CATH LAB;  Service: Cardiovascular;  Laterality: N/A;   PROSTATE SURGERY       reports that he quit smoking about 18 years ago. He has never used smokeless tobacco. He reports that he does not drink alcohol and does not use drugs.  No Known Allergies  Family History  Problem Relation Age of Onset   Cancer Neg Hx    Prior to Admission medications   Medication Sig Start Date End Date Taking? Authorizing Provider  abiraterone acetate (ZYTIGA) 250 MG tablet Take 2 tablets (500 mg total) by mouth daily. Take on an  empty stomach 1 hour before or 2 hours after a meal 02/26/21  Yes Derek Jack, MD  acetaminophen (TYLENOL) 650 MG CR tablet Take 1,300 mg by mouth every 8 (eight) hours as needed for pain.   Yes [provider]  amLODipine (NORVASC) 10 MG tablet Take 10 mg by mouth daily.   Yes [provider]  ascorbic acid (VITAMIN C) 250 MG tablet TAKE ONE TABLET BY MOUTH THREE TIMES A DAY WITH MEALS - TAKE WITH IRON TO PROMOTE BETTER ABSORPTION 03/18/21  Yes [provider]   atorvastatin (LIPITOR) 20 MG tablet Take 1 tablet (20 mg total) by mouth daily at 6 PM. Patient taking differently: Take 20 mg by mouth every evening. 07/23/12  Yes Rosita Fire, Brittainy M, PA-C  carvedilol (COREG) 25 MG tablet Take 50 mg by mouth 2 (two) times daily with a meal.   Yes [provider]  Cholecalciferol 25 MCG (1000 UT) tablet Take 1,000 Units by mouth daily.   Yes [provider]  diclofenac sodium (VOLTAREN) 1 % GEL Apply 2 g topically 4 (four) times daily. Patient taking differently: Apply 2 g topically 4 (four) times daily as needed (pain). 06/19/17  Yes Rancour, Annie Main, MD  Ensure (ENSURE) Take 237 mLs by mouth daily as needed (nutrition). Vanilla or chocolate   Yes [provider]  ferrous sulfate 324 MG TBEC Take 324 mg by mouth 2 (two) times daily.   Yes [provider]  fluticasone (FLONASE) 50 MCG/ACT nasal spray Place 2 sprays into both nostrils daily. Patient taking differently: Place 2 sprays into both nostrils daily as needed for allergies. 02/15/16  Yes Ward, Cyril Mourning N, DO  furosemide (LASIX) 40 MG tablet TAKE ONE TABLET BY MOUTH ONCE EVERY DAY FOR BLOOD PRESSURE AND FOR FLUID CONTROL 03/18/21  Yes [provider]  lisinopril (ZESTRIL) 40 MG tablet Take 1 tablet by mouth daily. 03/18/21  Yes [provider]  megestrol (MEGACE) 400 MG/10ML suspension Take 10 mLs (400 mg total) by mouth 2 (two) times daily. 03/20/21  Yes Derek Jack, MD  pantoprazole (PROTONIX) 40 MG tablet Take 1 tablet (40 mg total) by mouth daily. 03/20/21  Yes Derek Jack, MD  potassium chloride SA (KLOR-CON) 20 MEQ tablet Take 20 mEq by mouth daily. 02/28/20  Yes [provider]  predniSONE (DELTASONE) 5 MG tablet Take 1 tablet (5 mg total) by mouth daily with breakfast. 01/15/21  Yes Derek Jack, MD  rivaroxaban (XARELTO) 20 MG TABS tablet Take 1 tablet (20 mg total) by mouth daily with supper. 10/30/13  Yes Croitoru,  Mihai, MD  tamsulosin (FLOMAX) 0.4 MG CAPS capsule Take 0.4 mg by mouth daily.   Yes [provider]  traMADol (ULTRAM) 50 MG tablet Take 1 tablet (50 mg total) by mouth 2 (two) times daily. 02/26/21  Yes Derek Jack, MD  ondansetron (ZOFRAN-ODT) 8 MG disintegrating tablet Take 1 tablet (8 mg total) by mouth every 8 (eight) hours as needed for nausea or vomiting. Patient not taking: Reported on 03/20/2021 02/05/21   Barton Dubois, MD    Physical Exam: Vitals:   03/26/21 1555 03/26/21 1557 03/26/21 1600 03/26/21 1650  BP: (!) 78/51 (!) 76/55 (!) 105/92   Pulse: (!) 128 79 (!) 54   Resp: 19 (!) 30 (!) 21   Temp:    98.5 F (36.9 C)  TempSrc:    Oral  SpO2: 95% 96% 99%   Weight:      Height:  Constitutional: NAD, calm, comfortable Vitals:   03/26/21 1555 03/26/21 1557 03/26/21 1600 03/26/21 1650  BP: (!) 78/51 (!) 76/55 (!) 105/92   Pulse: (!) 128 79 (!) 54   Resp: 19 (!) 30 (!) 21   Temp:    98.5 F (36.9 C)  TempSrc:    Oral  SpO2: 95% 96% 99%   Weight:      Height:       Eyes: PERRL, lids and conjunctivae normal ENMT: Mucous membranes are dry.  Neck: normal, supple, no masses, no thyromegaly Respiratory: clear to auscultation bilaterally, no wheezing, no crackles. Normal respiratory effort. No accessory muscle use.  Cardiovascular: Regular rate and rhythm, 3/6 known systolic murmur, no  rubs / gallops. No extremity edema. 2+ pedal pulses.  Abdomen: no tenderness, no masses palpated. No hepatosplenomegaly. Bowel sounds positive.  Musculoskeletal: no clubbing / cyanosis. No joint deformity upper and lower extremities. Good ROM, no contractures. Normal muscle tone.  Skin: no rashes, lesions, ulcers. No induration Neurologic: No apparent cranial nerve abnormalities, moving extremities spontaneously.Marland Kitchen  Psychiatric: Normal judgment and insight. Alert and oriented x 3. Normal mood.   Labs on Admission: I have personally reviewed following labs and imaging  studies  CBC: Recent Labs  Lab 03/26/21 1144  WBC 8.9  NEUTROABS 6.1  HGB 11.6*  HCT 34.9*  MCV 104.2*  PLT 256   Basic Metabolic Panel: Recent Labs  Lab 03/26/21 1144  NA 133*  K 4.0  CL 104  CO2 19*  GLUCOSE 95  BUN 29*  CREATININE 2.07*  CALCIUM 9.0   GFR: Estimated Creatinine Clearance: 25.7 mL/min (A) (by C-G formula based on SCr of 2.07 mg/dL (H)). Liver Function Tests: Recent Labs  Lab 03/26/21 1144  AST 18  ALT 15  ALKPHOS 38  BILITOT 1.3*  PROT 6.4*  ALBUMIN 3.1*    Urine analysis:    Component Value Date/Time   COLORURINE YELLOW 03/26/2021 1101   APPEARANCEUR CLOUDY (A) 03/26/2021 1101   APPEARANCEUR Clear 01/20/2021 0938   LABSPEC 1.010 03/26/2021 1101   PHURINE 5.0 03/26/2021 1101   GLUCOSEU NEGATIVE 03/26/2021 1101   HGBUR MODERATE (A) 03/26/2021 1101   HGBUR negative 06/24/2007 0833   BILIRUBINUR NEGATIVE 03/26/2021 1101   BILIRUBINUR Negative 01/20/2021 0938   KETONESUR NEGATIVE 03/26/2021 1101   PROTEINUR 30 (A) 03/26/2021 1101   UROBILINOGEN 1.0 07/29/2013 2032   NITRITE NEGATIVE 03/26/2021 1101   LEUKOCYTESUR LARGE (A) 03/26/2021 1101    Radiological Exams on Admission: CT Head Wo Contrast  Result Date: 03/26/2021 CLINICAL DATA:  Syncope/presyncope, cerebrovascular cause suspected EXAM: CT HEAD WITHOUT CONTRAST TECHNIQUE: Contiguous axial images were obtained from the base of the skull through the vertex without intravenous contrast. RADIATION DOSE REDUCTION: This exam was performed according to the departmental dose-optimization program which includes automated exposure control, adjustment of the mA and/or kV according to patient size and/or use of iterative reconstruction technique. COMPARISON:  02/19/2021. FINDINGS: Brain: No evidence of acute infarction, hemorrhage, hydrocephalus, extra-axial collection or mass lesion/mass effect. Similar patchy white matter hypoattenuation, nonspecific but bowed chronic microvascular ischemic  disease. Similar atrophy. Vascular: No hyperdense vessel identified. Calcific intracranial atherosclerosis. Skull: No acute fracture. Sinuses/Orbits: Clear visualized sinuses.  Unremarkable orbits. Other: No mastoid effusions. IMPRESSION: 1. No evidence of acute intracranial abnormality. 2. Similar chronic microvascular ischemic disease and cerebral atrophy (ICD10-G31.9). Electronically Signed   By: Margaretha Sheffield M.D.   On: 03/26/2021 15:46   DG Chest Port 1 View  Result Date: 03/26/2021 CLINICAL DATA:  Syncope and weakness. EXAM: PORTABLE CHEST 1 VIEW COMPARISON:  03/06/2021 and CT chest 02/03/2021. FINDINGS: Trachea is midline. Heart size stable. Pacemaker lead tips are in the right atrium and right ventricle. Thoracic aorta is calcified. Minimal bibasilar linear scarring. No airspace consolidation or pleural fluid. IMPRESSION: No acute findings. Electronically Signed   By: Lorin Picket M.D.   On: 03/26/2021 11:23    EKG: Independently reviewed.  EKG with T wave abnormalities: First on last EKG show difference T waves, last EKG in ED similar to EKG from 02/2021, T wave inversions more pronounced.  Assessment/Plan Principal Problem:   Syncope Active Problems:   AKI (acute kidney injury) ([place])   UTI (urinary tract infection)   Essential hypertension   PROSTATE CANCER, HX OF   HTN (hypertension)   History of atrial fibrillation   Bilateral kidney masses   Adrenal mass, right (HCC)   DNR (do not resuscitate)   CKD (chronic kidney disease), stage III (HCC)   * Syncope- (present on admission) Likely due to low blood pressure, orthostasis.  Blood pressure dropped into the 70s.  In the setting of probable UTI.  Vomiting before passing out- ?  Vasovagal component.  He is on Lasix, lisinopril, carvedilol.  Troponin 43 x 2.  Head CT without acute abnormality. - Obtain echocardiogram. -1.5 L bolus given, continue N/s 75cc/hr x 20hrs -Hold Lasix and antihypertensives.    UTI (urinary tract  infection)- (present on admission) Reports urinary frequency.  UA with positive leukocytes and rare bacteria.  Afebrile no leukocytosis.  Rules out for sepsis. -Continue IV ceftriaxone 1g daily -Follow-up urine cultures  AKI (acute kidney injury) ([place])- (present on admission) AKI on CKD 3B. Denies GI losses, reports no change in oral intake.  Likely from Lasix, with lisinopril use. -Hold Both medications.  History of atrial fibrillation Rate controlled on carvedilol held for now due to hypotension and on anticoagulation with Xarelto.  HTN (hypertension)- (present on admission) Hypotensive in the ED down to 70s.  Hold lisinopril 40, Lasix 40 daily, Norvasc 10, carvedilol 50 twice daily.  PROSTATE CANCER, HX OF Stage IV.  Follows with Dr. Delton Coombes.  Patient no longer wants to continue Zytiga.   DVT prophylaxis: Heparin Code Status: DNR-confirmed with patient at bedside with daughter Gwinda Maine on the phone. Family Communication: Daughter Mamie on the phone, son Mel at bedside. Disposition Plan: ~ 2 days Consults called: None Admission status: obs tele    Bethena Roys MD [facility]  03/26/2021, 8:24 PM

## 2021-03-26 NOTE — Assessment & Plan Note (Addendum)
Stage IV.  Follows with Dr. Delton Coombes.  Patient no longer wants to continue Zytiga.

## 2021-03-26 NOTE — Assessment & Plan Note (Addendum)
Symptomatic with urinary frequency, UA grossly abnormal.  Treated with IV ceftriaxone.  Continues to oral antibiotics based on urine culture or duricef

## 2021-03-26 NOTE — Assessment & Plan Note (Deleted)
AKI on CKD 3B. Denies GI losses, reports no change in oral intake.  Likely from Lasix, with lisinopril use. -Hold Both medications.

## 2021-03-26 NOTE — Assessment & Plan Note (Addendum)
Hypotensive in the ED down to 70s.  Blood pressure now uptrending we can resume Coreg at low dose for his afib-and continue to hold rest of the medication which she will follow-up with PCP to discuss and resume

## 2021-03-26 NOTE — Assessment & Plan Note (Addendum)
Orthostatic syncope most likely etiology given patient's hypotension in 60s per EMS and similar episodes here, likely triggered by his UTI and multiple antihypertensives/lasix. Blood pressure improved with IV fluids.  Pacemaker interrogation unremarkable, CT head no acute finding, follow-up echocardiogram 02/05/21-EF 70 to 75%, no RWMA, G1 DD mild AS.  Advised to follow-up with PCP and resume antihypertensives slowly.  Echo not repeated due to insufficient echo in past 2 months.  PT OT advised home health PT OT.   Orthostatic VS for the past 24 hrs:  BP- Lying Pulse- Lying BP- Sitting Pulse- Sitting BP- Standing at 0 minutes Pulse- Standing at 0 minutes  03/28/21 0804 154/68 60 127/65 61 133/64 60  03/27/21 0915 146/63 62 118/55 64 98/58 74

## 2021-03-26 NOTE — ED Triage Notes (Signed)
Pt bib ems from home after syncopal event. Pt took tramadol and then started to feel sick.. pt began vomiting then had a syncopal event witnessed by family. Pt did not fall. BP 60 systolic after walking to the truck increased to 95/61 on arrival. CBG 94. 20g L hand, given 200cc NS en route.

## 2021-03-26 NOTE — ED Notes (Signed)
PA Idol made aware of Lactic 2.2

## 2021-03-27 DIAGNOSIS — Z85038 Personal history of other malignant neoplasm of large intestine: Secondary | ICD-10-CM | POA: Diagnosis not present

## 2021-03-27 DIAGNOSIS — Z8546 Personal history of malignant neoplasm of prostate: Secondary | ICD-10-CM | POA: Diagnosis not present

## 2021-03-27 DIAGNOSIS — M109 Gout, unspecified: Secondary | ICD-10-CM | POA: Diagnosis present

## 2021-03-27 DIAGNOSIS — D509 Iron deficiency anemia, unspecified: Secondary | ICD-10-CM | POA: Diagnosis present

## 2021-03-27 DIAGNOSIS — N179 Acute kidney failure, unspecified: Secondary | ICD-10-CM | POA: Diagnosis present

## 2021-03-27 DIAGNOSIS — I480 Paroxysmal atrial fibrillation: Secondary | ICD-10-CM | POA: Diagnosis present

## 2021-03-27 DIAGNOSIS — Z66 Do not resuscitate: Secondary | ICD-10-CM | POA: Diagnosis present

## 2021-03-27 DIAGNOSIS — I951 Orthostatic hypotension: Secondary | ICD-10-CM | POA: Diagnosis present

## 2021-03-27 DIAGNOSIS — Z9049 Acquired absence of other specified parts of digestive tract: Secondary | ICD-10-CM | POA: Diagnosis not present

## 2021-03-27 DIAGNOSIS — E785 Hyperlipidemia, unspecified: Secondary | ICD-10-CM | POA: Diagnosis present

## 2021-03-27 DIAGNOSIS — N390 Urinary tract infection, site not specified: Secondary | ICD-10-CM | POA: Diagnosis present

## 2021-03-27 DIAGNOSIS — Z7901 Long term (current) use of anticoagulants: Secondary | ICD-10-CM | POA: Diagnosis not present

## 2021-03-27 DIAGNOSIS — Z7952 Long term (current) use of systemic steroids: Secondary | ICD-10-CM | POA: Diagnosis not present

## 2021-03-27 DIAGNOSIS — C7972 Secondary malignant neoplasm of left adrenal gland: Secondary | ICD-10-CM | POA: Diagnosis present

## 2021-03-27 DIAGNOSIS — N1832 Chronic kidney disease, stage 3b: Secondary | ICD-10-CM | POA: Diagnosis present

## 2021-03-27 DIAGNOSIS — Z20822 Contact with and (suspected) exposure to covid-19: Secondary | ICD-10-CM | POA: Diagnosis present

## 2021-03-27 DIAGNOSIS — Z79818 Long term (current) use of other agents affecting estrogen receptors and estrogen levels: Secondary | ICD-10-CM | POA: Diagnosis not present

## 2021-03-27 DIAGNOSIS — K219 Gastro-esophageal reflux disease without esophagitis: Secondary | ICD-10-CM | POA: Diagnosis present

## 2021-03-27 DIAGNOSIS — Z79891 Long term (current) use of opiate analgesic: Secondary | ICD-10-CM | POA: Diagnosis not present

## 2021-03-27 DIAGNOSIS — Z87891 Personal history of nicotine dependence: Secondary | ICD-10-CM | POA: Diagnosis not present

## 2021-03-27 DIAGNOSIS — I129 Hypertensive chronic kidney disease with stage 1 through stage 4 chronic kidney disease, or unspecified chronic kidney disease: Secondary | ICD-10-CM | POA: Diagnosis present

## 2021-03-27 DIAGNOSIS — C7971 Secondary malignant neoplasm of right adrenal gland: Secondary | ICD-10-CM | POA: Diagnosis present

## 2021-03-27 DIAGNOSIS — I959 Hypotension, unspecified: Secondary | ICD-10-CM | POA: Diagnosis present

## 2021-03-27 DIAGNOSIS — N189 Chronic kidney disease, unspecified: Secondary | ICD-10-CM

## 2021-03-27 DIAGNOSIS — E872 Acidosis, unspecified: Secondary | ICD-10-CM | POA: Diagnosis present

## 2021-03-27 DIAGNOSIS — R55 Syncope and collapse: Secondary | ICD-10-CM | POA: Diagnosis present

## 2021-03-27 LAB — CBC
HCT: 29.6 % — ABNORMAL LOW (ref 39.0–52.0)
Hemoglobin: 9.9 g/dL — ABNORMAL LOW (ref 13.0–17.0)
MCH: 34.5 pg — ABNORMAL HIGH (ref 26.0–34.0)
MCHC: 33.4 g/dL (ref 30.0–36.0)
MCV: 103.1 fL — ABNORMAL HIGH (ref 80.0–100.0)
Platelets: 194 10*3/uL (ref 150–400)
RBC: 2.87 MIL/uL — ABNORMAL LOW (ref 4.22–5.81)
RDW: 13.7 % (ref 11.5–15.5)
WBC: 6 10*3/uL (ref 4.0–10.5)
nRBC: 0 % (ref 0.0–0.2)

## 2021-03-27 LAB — BASIC METABOLIC PANEL
Anion gap: 6 (ref 5–15)
BUN: 28 mg/dL — ABNORMAL HIGH (ref 8–23)
CO2: 20 mmol/L — ABNORMAL LOW (ref 22–32)
Calcium: 8.6 mg/dL — ABNORMAL LOW (ref 8.9–10.3)
Chloride: 109 mmol/L (ref 98–111)
Creatinine, Ser: 1.86 mg/dL — ABNORMAL HIGH (ref 0.61–1.24)
GFR, Estimated: 34 mL/min — ABNORMAL LOW (ref >60)
Glucose, Bld: 80 mg/dL (ref 70–99)
Potassium: 4 mmol/L (ref 3.5–5.1)
Sodium: 135 mmol/L (ref 135–145)

## 2021-03-27 MED ORDER — SODIUM CHLORIDE 0.9 % IV SOLN: INTRAVENOUS | Status: DC

## 2021-03-27 NOTE — Hospital Course (Addendum)
88 y.o. male with stage IV prostate cancer, atrial fibrillation, hypertension  after an episode where he  started trembling, tried to stand from sitting position, became lightheaded sat back down and passed out, did not hit his head, was out for maybe 5 seconds, had no confusion.EMS reports systolic BP in 16X. In ED-Afebrile temperature 97.9.  Heart rate 50s to 60s.  Respiratory rate 17-24.UA suggestive of UTI with positive leukocytes. Creatinine 2.07.Troponin 43 x 2.  Lactic acid acid 2.2 > 1.8.Pacemaker interrogated in the ED- no ectopy or event.Patient was feeling better after fluids, plan was to go home, but after eating a snack, patient vomited again, came lightheaded with drop in blood pressure to 70s.  He did not pass out.1.5 L IVF given and admitted for further management. His labs look stable and lactic acidosis improved creatinine down to 1.8.  Admitted for further IV hydration creatinine improved.  Echo was not repeated due to recent echo in past 2 months.  Seen by PT OT recommending home health PT OT.  Patient is clinically stabilized and improved.  If his orthostatic vitals remains negative will be discharged

## 2021-03-27 NOTE — Progress Notes (Signed)
PROGRESS NOTE Derrick Long  DEY:[id number] DOB: [birth year] DOA: 03/26/2021 PCP: [facility]   Brief Narrative/Hospital Course: Derrick Long, 88 y.o. male 88 y.o. male with stage IV prostate cancer, atrial fibrillation, hypertension  after an episode where he  started trembling, tried to stand from sitting position, became lightheaded sat back down and passed out, did not hit his head, was out for maybe 5 seconds, had no confusion.EMS reports systolic BP in 31S. In ED-Afebrile temperature 97.9.  Heart rate 50s to 60s.  Respiratory rate 17-24.UA suggestive of UTI with positive leukocytes. Creatinine 2.07.Troponin 43 x 2.  Lactic acid acid 2.2 > 1.8.Pacemaker interrogated in the ED- no ectopy or event.Patient was feeling better after fluids, plan was to go home, but after eating a snack, patient vomited again, came lightheaded with drop in blood pressure to 70s.  He did not pass out.1.5 L IVF given and admitted for further management    Subjective: Seen and examined this morning.  Daughter at the bedside feels somewhat better.  Orthostatic vitals still positive remains on IV fluid hydration Assessment and Plan: * Syncope- (present on admission) Orthostatic syncope most likely etiology given patient's hypotension in 60s per EMS and similar episodes here, likely triggered by his UTI.Blood pressure improved with IV fluids.  Pacemaker interrogation unremarkable, CT head no acute finding, follow-up echocardiogram 02/05/21-EF 70 to 75%, no RWMA, G1 DD mild AS. continue aggressive IV fluid hydration and continue to hold Lasix and antihypertensives. obtain PT OT evaluation for dispo.Advance diet.  Repeat orthostatics: ABNORMAL as below. Orthostatic VS for the past 24 hrs:  BP- Lying Pulse- Lying BP- Sitting Pulse- Sitting BP- Standing at 0 minutes Pulse- Standing at 0 minutes  03/27/21 0915 146/63 62 118/55 64 98/58 74       Acute kidney injury superimposed on chronic kidney disease  (HCC) AKI on CKD 3a recent baseline creat 1.2 in early Jan. likely multifactorial from Lasix lisinopril possible poor oral intake.  Holding those meds, continue on IV fluids creatinine improving, encourage oral intake. Recent Labs  Lab 03/26/21 1144 03/27/21 0422  BUN 29* 28*  CREATININE 2.07* 1.86*    UTI (urinary tract infection)- (present on admission) Symptomatic with urinary frequency, UA grossly abnormal.  Continue IV ceftriaxone follow-up culture and de-escalate antibiotics   History of atrial fibrillation Rate controlled.  Holding carvedilol due to hypotension.  On  Xarelto.  HTN (hypertension)- (present on admission) Hypotensive in the ED down to 70s.  We will continue to hold lisinopril 40, Lasix 40 daily, Norvasc 10, carvedilol 50 twice daily.  Will reintroduce meds slowly as BP allows   PROSTATE CANCER, HX OF Stage IV.  Follows with Dr. Delton Long.  Patient no longer wants to continue Zytiga.  DVT prophylaxis:  Code Status:   Code Status: DNR Family Communication: plan of care discussed with patient/daughter at bedside. Disposition: Currently not medically stable for discharge. Status is: Observation The patient will require care spanning > 2 midnights and should be moved to inpatient because: Ongoing orthostatic hypotension, AKI and need for IV fluid hydration. Objective: Vitals last 24 hrs: Vitals:   03/26/21 1858 03/26/21 2026 03/27/21 0203 03/27/21 0435  BP: (!) 123/47 (!) 81/60 95/60 132/63  Pulse: (!) 58 75 60 60  Resp: 17 20 17 18   Temp: 98 F (36.7 C) 97.8 F (36.6 C) 98.1 F (36.7 C) 98.2 F (36.8 C)  TempSrc: Oral     SpO2: 98% 97% 99% 97%  Weight:  Height:       Weight change:   Physical Examination: General exam: AA,older than stated age, weak appearing. HEENT:Oral mucosa moist, Ear/Nose WNL grossly, dentition normal. Respiratory system: bilaterally diminished BS, no use of accessory muscle Cardiovascular system: S1 & S2 +, No  JVD,. Gastrointestinal system: Abdomen soft,NT,ND, BS+ Nervous System:Alert, awake, moving extremities and grossly nonfocal Extremities: LE edema none,distal peripheral pulses palpable.  Skin: No rashes,no icterus. MSK: Normal muscle bulk,tone, power  Medications reviewed:  Scheduled Meds:  megestrol  400 mg Oral BID   pantoprazole  40 mg Oral Daily   predniSONE  5 mg Oral Q breakfast   rivaroxaban  15 mg Oral Q supper   tamsulosin  0.4 mg Oral Daily   Continuous Infusions:  sodium chloride     cefTRIAXone (ROCEPHIN)  IV        Diet Order             Diet full liquid Room service appropriate? Yes; Fluid consistency: Thin  Diet effective now                  Intake/Output Summary (Last 24 hours) at 03/27/2021 1124 Last data filed at 03/27/2021 0900 Gross per 24 hour  Intake 1467.54 ml  Output 600 ml  Net 867.54 ml   Net IO Since Admission: 867.54 mL [03/27/21 1124]  Wt Readings from Last 3 Encounters:  03/26/21 80.7 kg  03/20/21 80.3 kg  03/06/21 86.2 kg     Unresulted Labs (From admission, onward)     Start     Ordered   03/26/21 1727  Urine Culture  Add-on,   AD       Question:  Indication  Answer:  Altered mental status (if no other cause identified)   03/26/21 1727          Data Reviewed: I have personally reviewed following labs and imaging studies CBC: Recent Labs  Lab 03/26/21 1144 03/27/21 0422  WBC 8.9 6.0  NEUTROABS 6.1  --   HGB 11.6* 9.9*  HCT 34.9* 29.6*  MCV 104.2* 103.1*  PLT 197 622   Basic Metabolic Panel: Recent Labs  Lab 03/26/21 1144 03/27/21 0422  NA 133* 135  K 4.0 4.0  CL 104 109  CO2 19* 20*  GLUCOSE 95 80  BUN 29* 28*  CREATININE 2.07* 1.86*  CALCIUM 9.0 8.6*   GFR: Estimated Creatinine Clearance: 28.6 mL/min (A) (by C-G formula based on SCr of 1.86 mg/dL (H)). Liver Function Tests: Recent Labs  Lab 03/26/21 1144  AST 18  ALT 15  ALKPHOS 38  BILITOT 1.3*  PROT 6.4*  ALBUMIN 3.1*   No results for  input(s): LIPASE, AMYLASE in the last 168 hours. No results for input(s): AMMONIA in the last 168 hours. Coagulation Profile: No results for input(s): INR, PROTIME in the last 168 hours. Cardiac Enzymes: No results for input(s): CKTOTAL, CKMB, CKMBINDEX, TROPONINI in the last 168 hours. BNP (last 3 results) No results for input(s): PROBNP in the last 8760 hours. HbA1C: No results for input(s): HGBA1C in the last 72 hours. CBG: No results for input(s): GLUCAP in the last 168 hours. Lipid Profile: No results for input(s): CHOL, HDL, LDLCALC, TRIG, CHOLHDL, LDLDIRECT in the last 72 hours. Thyroid Function Tests: No results for input(s): TSH, T4TOTAL, FREET4, T3FREE, THYROIDAB in the last 72 hours. Anemia Panel: No results for input(s): VITAMINB12, FOLATE, FERRITIN, TIBC, IRON, RETICCTPCT in the last 72 hours. Sepsis Labs: Recent Labs  Lab 03/26/21 1144 03/26/21  1627  LATICACIDVEN 2.2* 1.8   Recent Results (from the past 240 hour(s))  Resp Panel by RT-PCR (Flu A&B, Covid) Nasopharyngeal Swab     Status: None   Collection Time: 03/26/21 11:01 AM   Specimen: Nasopharyngeal Swab; Nasopharyngeal(NP) swabs in vial transport medium  Result Value Ref Range Status   SARS Coronavirus 2 by RT PCR NEGATIVE NEGATIVE Final    Comment: (NOTE) SARS-CoV-2 target nucleic acids are NOT DETECTED.  The SARS-CoV-2 RNA is generally detectable in upper respiratory specimens during the acute phase of infection. The lowest concentration of SARS-CoV-2 viral copies this assay can detect is 138 copies/mL. A negative result does not preclude SARS-Cov-2 infection and should not be used as the sole basis for treatment or other patient management decisions. A negative result may occur with  improper specimen collection/handling, submission of specimen other than nasopharyngeal swab, presence of viral mutation(s) within the areas targeted by this assay, and inadequate number of viral copies(<138 copies/mL). A  negative result must be combined with clinical observations, patient history, and epidemiological information. The expected result is Negative.  Fact Sheet for Patients:  EntrepreneurPulse.com.au  Fact Sheet for Healthcare Providers:  IncredibleEmployment.be  This test is no t yet approved or cleared by the [place] FDA and  has been authorized for detection and/or diagnosis of SARS-CoV-2 by FDA under an Emergency Use Authorization (EUA). This EUA will remain  in effect (meaning this test can be used) for the duration of the COVID-19 declaration under Section 564(b)(1) of the Act, 21 U.S.C.section 360bbb-3(b)(1), unless the authorization is terminated  or revoked sooner.       Influenza A by PCR NEGATIVE NEGATIVE Final   Influenza B by PCR NEGATIVE NEGATIVE Final    Comment: (NOTE) The Xpert Xpress SARS-CoV-2/FLU/RSV plus assay is intended as an aid in the diagnosis of influenza from Nasopharyngeal swab specimens and should not be used as a sole basis for treatment. Nasal washings and aspirates are unacceptable for Xpert Xpress SARS-CoV-2/FLU/RSV testing.  Fact Sheet for Patients: EntrepreneurPulse.com.au  Fact Sheet for Healthcare Providers: IncredibleEmployment.be  This test is not yet approved or cleared by the [place] FDA and has been authorized for detection and/or diagnosis of SARS-CoV-2 by FDA under an Emergency Use Authorization (EUA). This EUA will remain in effect (meaning this test can be used) for the duration of the COVID-19 declaration under Section 564(b)(1) of the Act, 21 U.S.C. section 360bbb-3(b)(1), unless the authorization is terminated or revoked.  Performed at [facility], [address], [place], [place] [address]   Blood culture (routine x 2)     Status: None (Preliminary result)   Collection Time: 03/26/21  4:27 PM   Specimen: Right Antecubital; Blood  Result Value  Ref Range Status   Specimen Description RIGHT ANTECUBITAL  Final   Special Requests   Final    BOTTLES DRAWN AEROBIC AND ANAEROBIC Blood Culture adequate volume   Culture   Final    NO GROWTH < 24 HOURS Performed at [facility], [address] [place], [place] [address]    Report Status PENDING  Incomplete  Blood culture (routine x 2)     Status: None (Preliminary result)   Collection Time: 03/26/21  4:28 PM   Specimen: BLOOD RIGHT HAND  Result Value Ref Range Status   Specimen Description BLOOD RIGHT HAND  Final   Special Requests   Final    BOTTLES DRAWN AEROBIC ONLY Blood Culture results may not be optimal due to an inadequate  volume of blood received in culture bottles   Culture   Final    NO GROWTH < 24 HOURS Performed at [facility], [address], [place], [place] [address]    Report Status PENDING  Incomplete    Antimicrobials: Anti-infectives (From admission, onward)    Start     Dose/Rate Route Frequency Ordered Stop   03/27/21 1600  cefTRIAXone (ROCEPHIN) 1 g in sodium chloride 0.9 % 100 mL IVPB        1 g 200 mL/hr over 30 Minutes Intravenous  Once 03/26/21 1854     03/26/21 1615  cefTRIAXone (ROCEPHIN) 1 g in sodium chloride 0.9 % 100 mL IVPB        1 g 200 mL/hr over 30 Minutes Intravenous  Once 03/26/21 1602 03/26/21 1705      Culture/Microbiology    Component Value Date/Time   SDES BLOOD RIGHT HAND 03/26/2021 1628   SPECREQUEST  03/26/2021 1628    BOTTLES DRAWN AEROBIC ONLY Blood Culture results may not be optimal due to an inadequate volume of blood received in culture bottles   CULT  03/26/2021 1628    NO GROWTH < 24 HOURS Performed at [facility], [address] [place], [place] [address]    REPTSTATUS PENDING 03/26/2021 1628   Radiology Studies: CT Head Wo Contrast  Result Date: 03/26/2021 CLINICAL DATA:  Syncope/presyncope, cerebrovascular cause suspected EXAM: CT HEAD WITHOUT CONTRAST TECHNIQUE: Contiguous axial images were obtained from the  base of the skull through the vertex without intravenous contrast. RADIATION DOSE REDUCTION: This exam was performed according to the departmental dose-optimization program which includes automated exposure control, adjustment of the mA and/or kV according to patient size and/or use of iterative reconstruction technique. COMPARISON:  02/19/2021. FINDINGS: Brain: No evidence of acute infarction, hemorrhage, hydrocephalus, extra-axial collection or mass lesion/mass effect. Similar patchy white matter hypoattenuation, nonspecific but bowed chronic microvascular ischemic disease. Similar atrophy. Vascular: No hyperdense vessel identified. Calcific intracranial atherosclerosis. Skull: No acute fracture. Sinuses/Orbits: Clear visualized sinuses.  Unremarkable orbits. Other: No mastoid effusions. IMPRESSION: 1. No evidence of acute intracranial abnormality. 2. Similar chronic microvascular ischemic disease and cerebral atrophy (ICD10-G31.9). Electronically Signed   By: Margaretha Sheffield M.D.   On: 03/26/2021 15:46   DG Chest Port 1 View  Result Date: 03/26/2021 CLINICAL DATA:  Syncope and weakness. EXAM: PORTABLE CHEST 1 VIEW COMPARISON:  03/06/2021 and CT chest 02/03/2021. FINDINGS: Trachea is midline. Heart size stable. Pacemaker lead tips are in the right atrium and right ventricle. Thoracic aorta is calcified. Minimal bibasilar linear scarring. No airspace consolidation or pleural fluid. IMPRESSION: No acute findings. Electronically Signed   By: Lorin Picket M.D.   On: 03/26/2021 11:23     LOS: 0 days   Antonieta Pert, MD [facility]  03/27/2021, 11:24 AM

## 2021-03-27 NOTE — Progress Notes (Signed)
[facility] notified of pts hospital admission. [phone].

## 2021-03-27 NOTE — TOC Initial Note (Signed)
Transition of Care [facility]) - Initial/Assessment Note    Patient Details  Name: Derrick Long MRN: [id number] Date of Birth: 27-Jan-1927  Transition of Care [facility]) CM/SW Contact:    Shade Flood, LCSW Phone Number: 03/27/2021, 11:38 AM  Clinical Narrative:                  TOC received request from cancer center RN, Tomi, to assist with arranging [facility] for pt. They had been trying to arrange through their office without success. TOC met with pt and his daughter to discuss and review dc planning. Pt reports that he plans to return home at dc. He is aware of [facility] recommendation and he and his daughter are agreeable. CMS provider options reviewed. Will refer as requested.  Linda at [facility] working to get approval from [employer] provider. Once received, they can reach out to pt/family to arrange [facility] visits.  No other TOC needs identified at this time.  Expected Discharge Plan: [place] Barriers to Discharge: Continued Medical Work up   Patient Goals and CMS Choice Patient states their goals for this hospitalization and ongoing recovery are:: go home CMS Medicare.gov Compare Post Acute Care list provided to:: Patient Choice offered to / list presented to : Patient  Expected Discharge Plan and Services Expected Discharge Plan: [place] In-house Referral: Clinical Social Work   Post Acute Care Choice: [place] arrangements for the past 2 months: [place]: RN, PT, OT Deal Agency: [place] ([facility]) Date [place]: 03/27/21   Representative spoke with at [place]: Vaughan Basta  Prior Living Arrangements/Services Living arrangements for the past 2 months: [place] Lives with:: Adult Children Patient language and need for interpreter reviewed:: Yes Do you feel safe going back to the place where you live?: Yes      Need for Family Participation in Patient Care: Yes  (Comment) Care giver support system in place?: Yes (comment)   Criminal Activity/Legal Involvement Pertinent to Current Situation/Hospitalization: No - Comment as needed  Activities of Daily Living Home Assistive Devices/Equipment: Shower chair without back, Environmental consultant (specify type), Wheelchair ADL Screening (condition at time of admission) Patient's cognitive ability adequate to safely complete daily activities?: Yes Is the patient deaf or have difficulty hearing?: No Does the patient have difficulty seeing, even when wearing glasses/contacts?: No Does the patient have difficulty concentrating, remembering, or making decisions?: No Patient able to express need for assistance with ADLs?: Yes Does the patient have difficulty dressing or bathing?: Yes Independently performs ADLs?: No Communication: Independent Dressing (OT): Needs assistance Is this a change from baseline?: Pre-admission baseline Grooming: Needs assistance Is this a change from baseline?: Pre-admission baseline Feeding: Independent Bathing: Needs assistance Is this a change from baseline?: Pre-admission baseline Toileting: Needs assistance Is this a change from baseline?: Pre-admission baseline In/Out Bed: Needs assistance Is this a change from baseline?: Pre-admission baseline Walks in Home: Needs assistance Is this a change from baseline?: Pre-admission baseline Does the patient have difficulty walking or climbing stairs?: Yes Weakness of Legs: Both Weakness of Arms/Hands: None  Permission Sought/Granted Permission sought to share information with : Facility Art therapist granted to share information with : Yes, Verbal Permission Granted     Permission granted to share info w AGENCY: [facility]  Emotional Assessment Appearance:: Appears younger than stated age Attitude/Demeanor/Rapport: Engaged Affect (typically observed): Pleasant Orientation: : Oriented to Self, Oriented to Place,  Oriented to  Time, Oriented to Situation Alcohol / Substance Use: Not Applicable Psych Involvement: No (comment)  Admission diagnosis:  Syncope [R55] Hypotension, unspecified hypotension type [I95.9] Syncope, unspecified syncope type [R55] Nausea and vomiting, unspecified vomiting type [R11.2] Patient Active Problem List   Diagnosis Date Noted   Acute kidney injury superimposed on chronic kidney disease ([place]) 03/27/2021   UTI (urinary tract infection) 03/26/2021   DNR (do not resuscitate) 03/26/2021   CKD (chronic kidney disease), stage III ([place]) 03/26/2021   Elevated bilirubin    Elevated LFTs    RUQ pain    Intractable abdominal pain 02/03/2021   Prostate cancer ([place]) 07/10/2020   Bilateral kidney masses 06/06/2020   Adrenal mass, right ([place]) 06/06/2020   Acute colitis 09/16/2019   Abdominal pain 09/16/2019   Nausea 09/16/2019   Hypoalbuminemia 09/16/2019   Pelvic lymphadenopathy 09/16/2019   History of atrial fibrillation 09/16/2019   Colitis 09/16/2019   Paroxysmal atrial fibrillation ([place]) 09/02/2012   HTN (hypertension) 09/02/2012   Pacemaker 08/03/2012   Chest pain 07/31/2012   Leukocytosis, unspecified 07/31/2012   AKI (acute kidney injury) ([place]) 07/31/2012   Gout 07/31/2012   First degree AV block, PR interval 370 ms 07/19/2012   Chest pain, atypical 07/19/2012   Acute renal insufficiency 07/19/2012   Hypokalemia 07/19/2012   Syncope 07/19/2012   Bradycardia 07/19/2012   LUMBAR STRAIN 09/10/2008   ANEMIA, IRON DEFICIENCY NOS 08/16/2008   OSTEOARTHRITIS, MODERATE 09/21/2007   INSOMNIA 06/24/2007   Dyslipidemia 04/05/2007   DENTAL CARIES 10/08/2006   LATERAL MENISCUS TEAR, RIGHT 04/19/2006   MIGRAINE HEADACHE 12/18/2005   Essential hypertension 12/18/2005   GERD 12/18/2005   RENAL CALCULUS 12/18/2005   BENIGN PROSTATIC HYPERTROPHY, WITH OBSTRUCTION 12/18/2005   ARTHRITIS 12/18/2005   LOW BACK PAIN 12/18/2005   OSTEOPENIA 12/18/2005   COLON CANCER, HX OF  12/18/2005   PROSTATE CANCER, HX OF 12/18/2005   PCP:  [facility] [place]:   [place], [place] [address] [place] [address] Phone: [phone] Fax: [phone]  [place], [place] [address] Phone: [phone] Fax: [phone]     Social Determinants of Health ([place]) Interventions    Readmission Risk Interventions No flowsheet data found.

## 2021-03-27 NOTE — TOC Progression Note (Signed)
°  Transition of Care [facility]) Screening Note   Patient Details  Name: Derrick Long Date of Birth: 12-20-1926   Transition of Care [facility]) CM/SW Contact:    Shade Flood, LCSW Phone Number: 03/27/2021, 9:21 AM    Transition of Care Department [facility]) has reviewed patient and no TOC needs have been identified at this time. We will continue to monitor patient advancement through interdisciplinary progression rounds. If new patient transition needs arise, please place a TOC consult.  Received TOC consult for DME/HH needs. Reviewed pt's record. Pt does not have any therapy evaluation or recommendations at this time. Will clear this consult. If needs arise, TOC will follow up at that time.

## 2021-03-27 NOTE — Plan of Care (Signed)
°  Problem: Acute Rehab PT Goals(only PT should resolve) Goal: Patient Will Transfer Sit To/From Stand Outcome: Progressing Flowsheets (Taken 03/27/2021 1546) Patient will transfer sit to/from stand: Independently   Problem: Acute Rehab PT Goals(only PT should resolve) Goal: Pt Will Transfer Bed To Chair/Chair To Bed Outcome: Progressing Flowsheets (Taken 03/27/2021 1546) Pt will Transfer Bed to Chair/Chair to Bed: Independently   Problem: Acute Rehab PT Goals(only PT should resolve) Goal: Pt Will Ambulate Outcome: Progressing Flowsheets (Taken 03/27/2021 1546) Pt will Ambulate:  > 125 feet  with modified independence  with least restrictive assistive device  3:47 PM, 03/27/21 Lonell Grandchild, MPT Physical Therapist with [facility] [phone] office [phone] mobile phone

## 2021-03-27 NOTE — Assessment & Plan Note (Addendum)
AKI on CKD 3a recent baseline creat 1.2 in early Jan.  Improved to baseline with IV fluids.  Follow-up with PCP to resume rest of the home meds. Recent Labs  Lab 03/26/21 1144 03/27/21 0422 03/28/21 0348  BUN 29* 28* 22  CREATININE 2.07* 1.86* 1.32*

## 2021-03-27 NOTE — Evaluation (Signed)
Occupational Therapy Evaluation Patient Details Name: Derrick Long MRN: [id number] DOB: 03/03/1926 Today's Date: 03/27/2021   History of Present Illness Derrick Long is a 88 y.o. male with medical history significant for stage IV prostate cancer, atrial fibrillation, hypertension.  Patient was brought to the ED via EMS for parts of the syncopal event.  Patient daughter reported that patient started trembling, tried to stand from sitting position, became lightheaded sat back down and passed out.  He did not hit his head.  He was out for maybe 5 seconds.  Denies any change in his oral intake.  No vomiting no loose stools.  No chest pain no difficulty breathing no leg swelling.  He is on Lasix, lisinopril and carvedilol and reports compliance.  No confusion or change in mental status.  Reports recent urinary frequency, and change in odor of his urine.  EMS reports systolic in the 70J.   Clinical Impression   Pt agreeable to OT and PT co-evaluation. Pt reports independence at baseline but that he lives with his daughter who is available 24/7. Pt appears at or near baseline levels for ADL's with mild extended time to don socks seated at EOB. Pt demonstrated fair to good balance with use of RW in hall and room. Pt demonstrates B UE general weakness but reports this is his baseline levels. Pt is not recommended for further acute OT services and will be discharged to care of nursing staff for remaining length of stay.      Recommendations for follow up therapy are one component of a multi-disciplinary discharge planning process, led by the attending physician.  Recommendations may be updated based on patient status, additional functional criteria and insurance authorization.   Follow Up Recommendations  No OT follow up    Assistance Recommended at Discharge PRN        Functional Status Assessment  Patient has had a recent decline in their functional status and demonstrates the ability to make  significant improvements in function in a reasonable and predictable amount of time.  Equipment Recommendations  None recommended by OT           Precautions / Restrictions Precautions Precautions: Fall Restrictions Weight Bearing Restrictions: No      Mobility Bed Mobility Overal bed mobility: Independent                  Transfers Overall transfer level: Modified independent Equipment used: Rolling walker (2 wheels)               General transfer comment: Pt able to ambulate in room without RW initially but used RW when in hall and upon return to room.      Balance Overall balance assessment: Needs assistance Sitting-balance support: No upper extremity supported, Feet supported Sitting balance-Leahy Scale: Good Sitting balance - Comments: seated EOB   Standing balance support: Bilateral upper extremity supported, During functional activity Standing balance-Leahy Scale: Fair Standing balance comment: fair to good with RW                           ADL either performed or assessed with clinical judgement   ADL Overall ADL's : Modified independent                                       General ADL Comments: Mild labored movement for donning socks.  Pt able to ambulate in room and hall with RW.     Vision Baseline Vision/History: 1 Wears glasses (reading) Ability to See in Adequate Light: 1 Impaired Patient Visual Report: No change from baseline Vision Assessment?: No apparent visual deficits Additional Comments: Per observation during functional tasks.                Pertinent Vitals/Pain Pain Assessment Pain Assessment: No/denies pain     Hand Dominance Right   Extremity/Trunk Assessment Upper Extremity Assessment Upper Extremity Assessment: Generalized weakness   Lower Extremity Assessment Lower Extremity Assessment: Defer to PT evaluation   Cervical / Trunk Assessment Cervical / Trunk Assessment: Normal    Communication Communication Communication: No difficulties   Cognition Arousal/Alertness: Awake/alert Behavior During Therapy: WFL for tasks assessed/performed Overall Cognitive Status: Within Functional Limits for tasks assessed                                                        Home Living Family/patient expects to be discharged to:: Private residence Living Arrangements: Children Available Help at Discharge: Family;Available 24 hours/day Type of Home: House Home Access: Stairs to enter [employer] of Steps: 1 Entrance Stairs-Rails: Can reach both;Right;Left Home Layout: One level     Bathroom Shower/Tub: Tub/shower unit;Door   [employer] Toilet: Standard Bathroom Accessibility: Yes How Accessible: Accessible via wheelchair;Accessible via walker Home Equipment: Rolling Walker (2 wheels);Cane - single point;Other (comment);BSC/3in1;Wheelchair - manual;Shower seat;Grab bars - toilet;Grab bars - tub/shower (3 point cane)          Prior Functioning/Environment Prior Level of Function : Independent/Modified Independent             Mobility Comments: Using RW as needed; could ambulate in community ADLs Comments: Pt reports independence for ADL's and IADL's. Pt reports that he drives and does yard work.                      OT Goals(Current goals can be found in the care plan section) Acute Rehab OT Goals Patient Stated Goal: return home  OT Frequency:      Co-evaluation PT/OT/SLP Co-Evaluation/Treatment: Yes Reason for Co-Treatment: To address functional/ADL transfers   OT goals addressed during session: ADL's and self-care      AM-PAC OT "6 Clicks" Daily Activity     Outcome Measure Help from another person eating meals?: None Help from another person taking care of personal grooming?: None Help from another person toileting, which includes using toliet, bedpan, or urinal?: None Help from another person bathing  (including washing, rinsing, drying)?: None Help from another person to put on and taking off regular upper body clothing?: None Help from another person to put on and taking off regular lower body clothing?: None 6 Click Score: 24   End of Session Equipment Utilized During Treatment: Rolling walker (2 wheels)  Activity Tolerance: Patient tolerated treatment well Patient left: in bed;with call bell/phone within reach  OT Visit Diagnosis: Unsteadiness on feet (R26.81);Other abnormalities of gait and mobility (R26.89);Other (comment) (Syncope)                Time: 3818-2993 OT Time Calculation (min): 12 min Charges:  OT General Charges $OT Visit: 1 Visit OT Evaluation $OT Eval Low Complexity: 1 Low  Sneijder Bernards OT, MOT  Larey Seat 03/27/2021,  2:28 PM

## 2021-03-27 NOTE — Evaluation (Signed)
Physical Therapy Evaluation Patient Details Name: Derrick Long MRN: [id number] DOB: [birth year] Today's Date: 03/27/2021  History of Present Illness  Derrick Long is a 88 y.o. male with medical history significant for stage IV prostate cancer, atrial fibrillation, hypertension.  Patient was brought to the ED via EMS for parts of the syncopal event.  Patient daughter reported that patient started trembling, tried to stand from sitting position, became lightheaded sat back down and passed out.  He did not hit his head.  He was out for maybe 5 seconds.  Denies any change in his oral intake.  No vomiting no loose stools.  No chest pain no difficulty breathing no leg swelling.  He is on Lasix, lisinopril and carvedilol and reports compliance.  No confusion or change in mental status.  Reports recent urinary frequency, and change in odor of his urine.  EMS reports systolic in the 53Z.   Clinical Impression  Patient functioning near baseline for functional mobility and gait.  Patient able to ambulate short distances without AD, but safer using RW and demonstrates good return for ambulating in hallway without loss of balance and limited mostly due to fatigue.  Patient will benefit from continued skilled physical therapy in hospital and recommended venue below to increase strength, balance, endurance for safe ADLs and gait.         Recommendations for follow up therapy are one component of a multi-disciplinary discharge planning process, led by the attending physician.  Recommendations may be updated based on patient status, additional functional criteria and insurance authorization.  Follow Up Recommendations Home health PT    Assistance Recommended at Discharge PRN  Patient can return home with the following  A little help with walking and/or transfers;Help with stairs or ramp for entrance;Assistance with cooking/housework    Equipment Recommendations None recommended by PT  Recommendations  for Other Services       Functional Status Assessment Patient has had a recent decline in their functional status and demonstrates the ability to make significant improvements in function in a reasonable and predictable amount of time.     Precautions / Restrictions Precautions Precautions: Fall Restrictions Weight Bearing Restrictions: No      Mobility  Bed Mobility Overal bed mobility: Independent                  Transfers Overall transfer level: Modified independent                      Ambulation/Gait Ambulation/Gait assistance: Supervision Gait Distance (Feet): 100 Feet Assistive device: Rolling walker (2 wheels) Gait Pattern/deviations: Decreased step length - right, Decreased step length - left, Decreased stride length Gait velocity: decreased     General Gait Details: able to ambulate safely without AD for a few steps, but safer for longer distances using RW demonstrating good return for ambulation in hallway without loss of balance, limited secondary to fatigue  Stairs            Wheelchair Mobility    Modified Rankin (Stroke Patients Only)       Balance Overall balance assessment: Needs assistance Sitting-balance support: Feet supported, No upper extremity supported Sitting balance-Leahy Scale: Good Sitting balance - Comments: seated EOB   Standing balance support: During functional activity, No upper extremity supported Standing balance-Leahy Scale: Fair Standing balance comment: fair/good using RW  Pertinent Vitals/Pain Pain Assessment Pain Assessment: No/denies pain    Home Living Family/patient expects to be discharged to:: Private residence Living Arrangements: Children Available Help at Discharge: Family;Available 24 hours/day Type of Home: House Home Access: Stairs to enter Entrance Stairs-Rails: Can reach both;Right;Left Entrance Stairs-Number of Steps: 1   Home Layout: One  level Home Equipment: Conservation officer, nature (2 wheels);Cane - single point;Other (comment);BSC/3in1;Wheelchair - manual;Shower seat;Grab bars - toilet;Grab bars - tub/shower      Prior Function Prior Level of Function : Independent/Modified Independent             Mobility Comments: Using RW as needed; could ambulate in community ADLs Comments: Pt reports independence for ADL's and IADL's. Pt reports that he drives and does yard work.     Hand Dominance   Dominant Hand: Right    Extremity/Trunk Assessment   Upper Extremity Assessment Upper Extremity Assessment: Defer to OT evaluation    Lower Extremity Assessment Lower Extremity Assessment: Generalized weakness    Cervical / Trunk Assessment Cervical / Trunk Assessment: Normal  Communication   Communication: No difficulties  Cognition Arousal/Alertness: Awake/alert Behavior During Therapy: WFL for tasks assessed/performed Overall Cognitive Status: Within Functional Limits for tasks assessed                                          General Comments      Exercises     Assessment/Plan    PT Assessment Patient needs continued PT services  PT Problem List Decreased strength;Decreased activity tolerance;Decreased balance;Decreased mobility       PT Treatment Interventions DME instruction;Gait training;Stair training;Functional mobility training;Therapeutic activities;Therapeutic exercise;Patient/family education;Balance training    PT Goals (Current goals can be found in the Care Plan section)  Acute Rehab PT Goals Patient Stated Goal: return home with family to assist PT Goal Formulation: With patient Time For Goal Achievement: 03/31/21 Potential to Achieve Goals: Good    Frequency Min 2X/week     Co-evaluation PT/OT/SLP Co-Evaluation/Treatment: Yes Reason for Co-Treatment: To address functional/ADL transfers PT goals addressed during session: Mobility/safety with mobility;Balance;Proper use of  DME OT goals addressed during session: ADL's and self-care       AM-PAC PT "6 Clicks" Mobility  Outcome Measure Help needed turning from your back to your side while in a flat bed without using bedrails?: None Help needed moving from lying on your back to sitting on the side of a flat bed without using bedrails?: None Help needed moving to and from a bed to a chair (including a wheelchair)?: None Help needed standing up from a chair using your arms (e.g., wheelchair or bedside chair)?: None Help needed to walk in hospital room?: A Little Help needed climbing 3-5 steps with a railing? : A Little 6 Click Score: 22    End of Session   Activity Tolerance: Patient tolerated treatment well;Patient limited by fatigue Patient left: in bed;with call bell/phone within reach Nurse Communication: Mobility status PT Visit Diagnosis: Unsteadiness on feet (R26.81);Other abnormalities of gait and mobility (R26.89);Muscle weakness (generalized) (M62.81)    Time: 1287-8676 PT Time Calculation (min) (ACUTE ONLY): 26 min   Charges:   PT Evaluation $PT Eval Moderate Complexity: 1 Mod PT Treatments $Therapeutic Activity: 23-37 mins        3:44 PM, 03/27/21 Lonell Grandchild, MPT Physical Therapist with [facility] [phone] office [phone] mobile phone

## 2021-03-27 NOTE — Progress Notes (Signed)
Oncology Discharge Planning Admission Note  [place] at [facility] Address: [address], [place] [address] Hours of Operation:  8am - 5pm, Monday - Friday  Clinic Contact Information:  [phone]  Oncology Care Team: Medical Oncologist:  Derek Jack  Dr. Delton Coombes is aware of this hospital admission dated 03/26/21. Expected to be discharged today according to [facility]. The cancer center will follow [facility] inpatient care to assist with discharge planning as indicated by the oncologist.  Disclaimer:  This [place] note does not imply a formal consult request has been made by the admitting attending for this admission or there will be an inpatient consult completed by oncology.  Please request oncology consults as per standard process as indicated.

## 2021-03-28 LAB — BASIC METABOLIC PANEL
Anion gap: 6 (ref 5–15)
BUN: 22 mg/dL (ref 8–23)
CO2: 20 mmol/L — ABNORMAL LOW (ref 22–32)
Calcium: 8.5 mg/dL — ABNORMAL LOW (ref 8.9–10.3)
Chloride: 111 mmol/L (ref 98–111)
Creatinine, Ser: 1.32 mg/dL — ABNORMAL HIGH (ref 0.61–1.24)
GFR, Estimated: 51 mL/min — ABNORMAL LOW (ref >60)
Glucose, Bld: 84 mg/dL (ref 70–99)
Potassium: 4 mmol/L (ref 3.5–5.1)
Sodium: 137 mmol/L (ref 135–145)

## 2021-03-28 MED ORDER — CARVEDILOL 3.125 MG PO TABS: 3.1250 mg | ORAL_TABLET | Freq: Two times a day (BID) | ORAL | 0 refills | Status: DC

## 2021-03-28 MED ORDER — CEFADROXIL 500 MG PO CAPS: 500.0000 mg | ORAL_CAPSULE | Freq: Two times a day (BID) | ORAL | 0 refills | Status: AC

## 2021-03-28 NOTE — Progress Notes (Signed)
Patient has been stable during night with no complaints.  At approximately 0500, IV infiltrated.  Patient is for possible discharge today, vitals have been stable with no complaints.  Left out IV per patient and family request.

## 2021-03-28 NOTE — TOC Transition Note (Signed)
Transition of Care [facility]) - CM/SW Discharge Note   Patient Details  Name: FIDENCIO DUDDY MRN: [id number] Date of Birth: October 15, 1926  Transition of Care [facility]) CM/SW Contact:  Shade Flood, LCSW Phone Number: 03/28/2021, 8:42 AM   Clinical Narrative:     Pt with dc orders today. Plan remains for dc home with [facility] to provider RN and therapy in the home if/when approved by the [place]. Information added to the AVS. Anticipating family transport home.  There are no other TOC needs for dc.  Final next level of care: [place] Barriers to Discharge: Barriers Resolved   Patient Goals and CMS Choice Patient states their goals for this hospitalization and ongoing recovery are:: go home CMS Medicare.gov Compare Post Acute Care list provided to:: Patient Choice offered to / list presented to : Patient  Discharge Placement                       Discharge Plan and Services In-house Referral: Clinical Social Work   Post Acute Care Choice: Home Health                    HH Arranged: RN, PT, OT [facility] Agency: [place] ([place]) Date [place]: 03/27/21   Representative spoke with at [place]: [place] ([place]) Interventions     Readmission Risk Interventions No flowsheet data found.

## 2021-03-28 NOTE — Discharge Summary (Addendum)
Physician Discharge Summary   Patient: Derrick Long MRN: [id number] DOB: [birth year]  Admit date:     03/26/2021  Discharge date: 03/28/2021  Discharge Physician: Antonieta Pert   PCP: [facility] [place]   Recommendations at discharge:    Pcp 1 wk  Discharge Diagnoses: Principal Problem:   Syncope Active Problems:   Acute kidney injury superimposed on chronic kidney disease ([place])   UTI (urinary tract infection)   PROSTATE CANCER, HX OF   HTN (hypertension)   History of atrial fibrillation   DNR (do not resuscitate)  Resolved Problems:   * No resolved hospital problems. *   Hospital Course: 88 y.o. male with stage IV prostate cancer, atrial fibrillation, hypertension  after an episode where he  started trembling, tried to stand from sitting position, became lightheaded sat back down and passed out, did not hit his head, was out for maybe 5 seconds, had no confusion.EMS reports systolic BP in 76A. In ED-Afebrile temperature 97.9.  Heart rate 50s to 60s.  Respiratory rate 17-24.UA suggestive of UTI with positive leukocytes. Creatinine 2.07.Troponin 43 x 2.  Lactic acid acid 2.2 > 1.8.Pacemaker interrogated in the ED- no ectopy or event.Patient was feeling better after fluids, plan was to go home, but after eating a snack, patient vomited again, came lightheaded with drop in blood pressure to 70s.  He did not pass out.1.5 L IVF given and admitted for further management. His labs look stable and lactic acidosis improved creatinine down to 1.8.  Admitted for further IV hydration creatinine improved.  Echo was not repeated due to recent echo in past 2 months.  Seen by PT OT recommending home health PT OT.  Patient is clinically stabilized and improved.  If his orthostatic vitals remains negative will be discharged Orthostatic bp better and he is asymptomatic although bp decreasing on standing.  Assessment and Plan: * Syncope- (present on admission) Orthostatic syncope most likely  etiology given patient's hypotension in 60s per EMS and similar episodes here, likely triggered by his UTI and multiple antihypertensives/lasix. Blood pressure improved with IV fluids.  Pacemaker interrogation unremarkable, CT head no acute finding, follow-up echocardiogram 02/05/21-EF 70 to 75%, no RWMA, G1 DD mild AS.  Advised to follow-up with PCP and resume antihypertensives slowly.  Echo not repeated due to insufficient echo in past 2 months.  PT OT advised home health PT OT.   Orthostatic VS for the past 24 hrs:  BP- Lying Pulse- Lying BP- Sitting Pulse- Sitting BP- Standing at 0 minutes Pulse- Standing at 0 minutes  03/28/21 0804 154/68 60 127/65 61 133/64 60  03/27/21 0915 146/63 62 118/55 64 98/58 74       Acute kidney injury superimposed on chronic kidney disease (HCC) AKI on CKD 3a recent baseline creat 1.2 in early Jan.  Improved to baseline with IV fluids.  Follow-up with PCP to resume rest of the home meds. Recent Labs  Lab 03/26/21 1144 03/27/21 0422 03/28/21 0348  BUN 29* 28* 22  CREATININE 2.07* 1.86* 1.32*    UTI (urinary tract infection)- (present on admission) Symptomatic with urinary frequency, UA grossly abnormal.  Treated with IV ceftriaxone.  Continues to oral antibiotics based on urine culture or duricef   History of atrial fibrillation Rate controlled.  Holding carvedilol due to hypotension during hospitalization will consider resuming Coreg at lower dose and continue home  Xarelto.  HTN (hypertension)- (present on admission) Hypotensive in the ED down to 70s.  Blood pressure now uptrending we can  resume Coreg at low dose for his afib-and continue to hold rest of the medication which she will follow-up with PCP to discuss and resume   PROSTATE CANCER, HX OF Stage IV.  Follows with Dr. Delton Coombes.  Patient no longer wants to continue Zytiga.          Consultants:none Procedures performed: none  Disposition: Home Diet recommendation:  regular  diet  DISCHARGE MEDICATION: Allergies as of 03/28/2021   No Known Allergies      Medication List     STOP taking these medications    amLODipine 10 MG tablet Commonly known as: NORVASC   lisinopril 40 MG tablet Commonly known as: ZESTRIL       TAKE these medications    abiraterone acetate 250 MG tablet Commonly known as: ZYTIGA Take 2 tablets (500 mg total) by mouth daily. Take on an empty stomach 1 hour before or 2 hours after a meal   acetaminophen 650 MG CR tablet Commonly known as: TYLENOL Take 1,300 mg by mouth every 8 (eight) hours as needed for pain.   ascorbic acid 250 MG tablet Commonly known as: VITAMIN C TAKE ONE TABLET BY MOUTH THREE TIMES A DAY WITH MEALS - TAKE WITH IRON TO PROMOTE BETTER ABSORPTION   atorvastatin 20 MG tablet Commonly known as: LIPITOR Take 1 tablet (20 mg total) by mouth daily at 6 PM. What changed: when to take this   carvedilol 3.125 MG tablet Commonly known as: Coreg Take 1 tablet (3.125 mg total) by mouth 2 (two) times daily. What changed:  medication strength how much to take when to take this   cefadroxil 500 MG capsule Commonly known as: DURICEF Take 1 capsule (500 mg total) by mouth 2 (two) times daily for 3 days.   Cholecalciferol 25 MCG (1000 UT) tablet Take 1,000 Units by mouth daily.   diclofenac sodium 1 % Gel Commonly known as: VOLTAREN Apply 2 g topically 4 (four) times daily. What changed:  when to take this reasons to take this   Ensure Take 237 mLs by mouth daily as needed (nutrition). Vanilla or chocolate   ferrous sulfate 324 MG Tbec Take 324 mg by mouth 2 (two) times daily.   Flomax 0.4 MG Caps capsule Generic drug: tamsulosin Take 0.4 mg by mouth daily.   fluticasone 50 MCG/ACT nasal spray Commonly known as: FLONASE Place 2 sprays into both nostrils daily. What changed:  when to take this reasons to take this   furosemide 40 MG tablet Commonly known as: LASIX TAKE ONE TABLET BY  MOUTH ONCE EVERY DAY FOR BLOOD PRESSURE AND FOR FLUID CONTROL   megestrol 400 MG/10ML suspension Commonly known as: MEGACE Take 10 mLs (400 mg total) by mouth 2 (two) times daily.   ondansetron 8 MG disintegrating tablet Commonly known as: ZOFRAN-ODT Take 1 tablet (8 mg total) by mouth every 8 (eight) hours as needed for nausea or vomiting.   pantoprazole 40 MG tablet Commonly known as: Protonix Take 1 tablet (40 mg total) by mouth daily.   potassium chloride SA 20 MEQ tablet Commonly known as: KLOR-CON M Take 20 mEq by mouth daily.   predniSONE 5 MG tablet Commonly known as: DELTASONE Take 1 tablet (5 mg total) by mouth daily with breakfast.   rivaroxaban 20 MG Tabs tablet Commonly known as: XARELTO Take 1 tablet (20 mg total) by mouth daily with supper.   traMADol 50 MG tablet Commonly known as: ULTRAM Take 1 tablet (50 mg total) by mouth 2 (  two) times daily.        [facility] Follow up in 1 week(s).   Specialty: General Practice Why: To resume your home meds Contact information: [place] [address] [phone]         [facility] Follow [facility].   Specialty: [place] Why: [facility] staff will call you to schedule visits when approved by the [facility] [place]                Discharge Exam: Filed Weights   03/26/21 1010  Weight: 80.7 kg  Condition at discharge: good  The results of significant diagnostics from this hospitalization (including imaging, microbiology, ancillary and laboratory) are listed below for reference.   Imaging Studies: DG Chest 2 View  Result Date: 03/06/2021 CLINICAL DATA:  Intermittent weakness EXAM: CHEST - 2 VIEW COMPARISON:  02/19/2021 FINDINGS: Transverse diameter of heart is slightly increased. Thoracic aorta is tortuous and ectatic. Lung fields are clear of any infiltrates or pulmonary edema. Left hemidiaphragm is elevated. There is no pleural  effusion or pneumothorax. Pacemaker battery is seen in the left infraclavicular region. Degenerative changes are noted in the right shoulder and right AC joint. IMPRESSION: No active cardiopulmonary disease. Electronically Signed   By: Elmer Picker M.D.   On: 03/06/2021 12:36   CT Head Wo Contrast  Result Date: 03/26/2021 CLINICAL DATA:  Syncope/presyncope, cerebrovascular cause suspected EXAM: CT HEAD WITHOUT CONTRAST TECHNIQUE: Contiguous axial images were obtained from the base of the skull through the vertex without intravenous contrast. RADIATION DOSE REDUCTION: This exam was performed according to the departmental dose-optimization program which includes automated exposure control, adjustment of the mA and/or kV according to patient size and/or use of iterative reconstruction technique. COMPARISON:  02/19/2021. FINDINGS: Brain: No evidence of acute infarction, hemorrhage, hydrocephalus, extra-axial collection or mass lesion/mass effect. Similar patchy white matter hypoattenuation, nonspecific but bowed chronic microvascular ischemic disease. Similar atrophy. Vascular: No hyperdense vessel identified. Calcific intracranial atherosclerosis. Skull: No acute fracture. Sinuses/Orbits: Clear visualized sinuses.  Unremarkable orbits. Other: No mastoid effusions. IMPRESSION: 1. No evidence of acute intracranial abnormality. 2. Similar chronic microvascular ischemic disease and cerebral atrophy (ICD10-G31.9). Electronically Signed   By: Margaretha Sheffield M.D.   On: 03/26/2021 15:46   DG Chest Port 1 View  Result Date: 03/26/2021 CLINICAL DATA:  Syncope and weakness. EXAM: PORTABLE CHEST 1 VIEW COMPARISON:  03/06/2021 and CT chest 02/03/2021. FINDINGS: Trachea is midline. Heart size stable. Pacemaker lead tips are in the right atrium and right ventricle. Thoracic aorta is calcified. Minimal bibasilar linear scarring. No airspace consolidation or pleural fluid. IMPRESSION: No acute findings. Electronically  Signed   By: Lorin Picket M.D.   On: 03/26/2021 11:23    Microbiology: Results for orders placed or performed during the hospital encounter of 03/26/21  Resp Panel by RT-PCR (Flu A&B, Covid) Nasopharyngeal Swab     Status: None   Collection Time: 03/26/21 11:01 AM   Specimen: Nasopharyngeal Swab; Nasopharyngeal(NP) swabs in vial transport medium  Result Value Ref Range Status   SARS Coronavirus 2 by RT PCR NEGATIVE NEGATIVE Final    Comment: (NOTE) SARS-CoV-2 target nucleic acids are NOT DETECTED.  The SARS-CoV-2 RNA is generally detectable in upper respiratory specimens during the acute phase of infection. The lowest concentration of SARS-CoV-2 viral copies this assay can detect is 138 copies/mL. A negative result does not preclude SARS-Cov-2 infection and should not be used as the  sole basis for treatment or other patient management decisions. A negative result may occur with  improper specimen collection/handling, submission of specimen other than nasopharyngeal swab, presence of viral mutation(s) within the areas targeted by this assay, and inadequate number of viral copies(<138 copies/mL). A negative result must be combined with clinical observations, patient history, and epidemiological information. The expected result is Negative.  Fact Sheet for Patients:  EntrepreneurPulse.com.au  Fact Sheet for Healthcare Providers:  IncredibleEmployment.be  This test is no t yet approved or cleared by the [place] FDA and  has been authorized for detection and/or diagnosis of SARS-CoV-2 by FDA under an Emergency Use Authorization (EUA). This EUA will remain  in effect (meaning this test can be used) for the duration of the COVID-19 declaration under Section 564(b)(1) of the Act, 21 U.S.C.section 360bbb-3(b)(1), unless the authorization is terminated  or revoked sooner.       Influenza A by PCR NEGATIVE NEGATIVE Final   Influenza B by PCR  NEGATIVE NEGATIVE Final    Comment: (NOTE) The Xpert Xpress SARS-CoV-2/FLU/RSV plus assay is intended as an aid in the diagnosis of influenza from Nasopharyngeal swab specimens and should not be used as a sole basis for treatment. Nasal washings and aspirates are unacceptable for Xpert Xpress SARS-CoV-2/FLU/RSV testing.  Fact Sheet for Patients: EntrepreneurPulse.com.au  Fact Sheet for Healthcare Providers: IncredibleEmployment.be  This test is not yet approved or cleared by the [place] FDA and has been authorized for detection and/or diagnosis of SARS-CoV-2 by FDA under an Emergency Use Authorization (EUA). This EUA will remain in effect (meaning this test can be used) for the duration of the COVID-19 declaration under Section 564(b)(1) of the Act, 21 U.S.C. section 360bbb-3(b)(1), unless the authorization is terminated or revoked.  Performed at [facility], [address], [place], [place] [address]   Urine Culture     Status: Abnormal   Collection Time: 03/26/21 11:01 AM   Specimen: Urine, Clean Catch  Result Value Ref Range Status   Specimen Description   Final    URINE, CLEAN CATCH Performed at [facility], [address], [place], [place] [address]    Special Requests   Final    NONE Performed at [facility], [address], [place],  [address]    Culture >=100,000 COLONIES/mL ESCHERICHIA COLI (A)  Final   Report Status 03/29/2021 FINAL  Final   Organism ID, Bacteria ESCHERICHIA COLI (A)  Final      Susceptibility   Escherichia coli - MIC*    AMPICILLIN 16 INTERMEDIATE Intermediate     CEFAZOLIN <=4 SENSITIVE Sensitive     CEFEPIME <=0.12 SENSITIVE Sensitive     CEFTRIAXONE <=0.25 SENSITIVE Sensitive     CIPROFLOXACIN <=0.25 SENSITIVE Sensitive     GENTAMICIN <=1 SENSITIVE Sensitive     IMIPENEM <=0.25 SENSITIVE Sensitive     NITROFURANTOIN <=16 SENSITIVE Sensitive     TRIMETH/SULFA <=20 SENSITIVE Sensitive      AMPICILLIN/SULBACTAM 4 SENSITIVE Sensitive     PIP/TAZO <=4 SENSITIVE Sensitive     * >=100,000 COLONIES/mL ESCHERICHIA COLI  Blood culture (routine x 2)     Status: None (Preliminary result)   Collection Time: 03/26/21  4:27 PM   Specimen: Right Antecubital; Blood  Result Value Ref Range Status   Specimen Description RIGHT ANTECUBITAL  Final   Special Requests   Final    BOTTLES DRAWN AEROBIC AND ANAEROBIC Blood Culture adequate volume   Culture   Final    NO GROWTH 4  DAYS Performed at [facility], [address], [place], [place] [address]    Report Status PENDING  Incomplete  Blood culture (routine x 2)     Status: None (Preliminary result)   Collection Time: 03/26/21  4:28 PM   Specimen: BLOOD RIGHT HAND  Result Value Ref Range Status   Specimen Description BLOOD RIGHT HAND  Final   Special Requests   Final    BOTTLES DRAWN AEROBIC ONLY Blood Culture results may not be optimal due to an inadequate volume of blood received in culture bottles   Culture   Final    NO GROWTH 4 DAYS Performed at [facility], [address], [place], [place] [address]    Report Status PENDING  Incomplete    Labs: CBC: Recent Labs  Lab 03/26/21 1144 03/27/21 0422  WBC 8.9 6.0  NEUTROABS 6.1  --   HGB 11.6* 9.9*  HCT 34.9* 29.6*  MCV 104.2* 103.1*  PLT 197 975   Basic Metabolic Panel: Recent Labs  Lab 03/26/21 1144 03/27/21 0422 03/28/21 0348  NA 133* 135 137  K 4.0 4.0 4.0  CL 104 109 111  CO2 19* 20* 20*  GLUCOSE 95 80 84  BUN 29* 28* 22  CREATININE 2.07* 1.86* 1.32*  CALCIUM 9.0 8.6* 8.5*   Liver Function Tests: Recent Labs  Lab 03/26/21 1144  AST 18  ALT 15  ALKPHOS 38  BILITOT 1.3*  PROT 6.4*  ALBUMIN 3.1*   CBG: No results for input(s): GLUCAP in the last 168 hours.  Discharge time spent:  35 minutes.  Signed: Antonieta Pert, MD [facility] 03/30/2021

## 2021-03-29 LAB — URINE CULTURE: Culture: >=100000 — AB

## 2021-03-30 NOTE — Discharge Summary (Deleted)
Physician Discharge Summary   Patient: Derrick Long MRN: [id number] DOB: [birth year]  Admit date:     03/26/2021  Discharge date: 03/28/2021  Discharge Physician: Antonieta Pert   PCP: [facility]   Recommendations at discharge:   Follow-up with PCP in 1 week  Discharge Diagnoses: Principal Problem:   Syncope Active Problems:   Acute kidney injury superimposed on chronic kidney disease (HCC)   UTI (urinary tract infection)   PROSTATE CANCER, HX OF   HTN (hypertension)   History of atrial fibrillation   DNR (do not resuscitate)  Resolved Problems:   * No resolved hospital problems. *   Hospital Course: 88 y.o. male with stage IV prostate cancer, atrial fibrillation, hypertension  after an episode where he  started trembling, tried to stand from sitting position, became lightheaded sat back down and passed out, did not hit his head, was out for maybe 5 seconds, had no confusion.EMS reports systolic BP in 81K. In ED-Afebrile temperature 97.9.  Heart rate 50s to 60s.  Respiratory rate 17-24.UA suggestive of UTI with positive leukocytes. Creatinine 2.07.Troponin 43 x 2.  Lactic acid acid 2.2 > 1.8.Pacemaker interrogated in the ED- no ectopy or event.Patient was feeling better after fluids, plan was to go home, but after eating a snack, patient vomited again, came lightheaded with drop in blood pressure to 70s.  He did not pass out.1.5 L IVF given and admitted for further management. His labs look stable and lactic acidosis improved creatinine down to 1.8.  Admitted for further IV hydration creatinine improved.  Echo was not repeated due to recent echo in past 2 months.  Seen by PT OT recommending home health PT OT.  Patient is clinically stabilized and improved.  If his orthostatic vitals remains negative will be discharged Urine culture reviewed after discharge growing E. coli, and sensitive on the prescribed antibiotic  Assessment and Plan: * Syncope- (present on  admission) Orthostatic syncope most likely etiology given patient's hypotension in 60s per EMS and similar episodes here, likely triggered by his UTI and multiple antihypertensives/lasix. Blood pressure improved with IV fluids.  Pacemaker interrogation unremarkable, CT head no acute finding, follow-up echocardiogram 02/05/21-EF 70 to 75%, no RWMA, G1 DD mild AS.  Advised to follow-up with PCP and resume antihypertensives slowly.  Echo not repeated due to insufficient echo in past 2 months.  PT OT advised home health PT OT.   Orthostatic VS for the past 24 hrs:  BP- Lying Pulse- Lying BP- Sitting Pulse- Sitting BP- Standing at 0 minutes Pulse- Standing at 0 minutes  03/28/21 0804 154/68 60 127/65 61 133/64 60  03/27/21 0915 146/63 62 118/55 64 98/58 74       Acute kidney injury superimposed on chronic kidney disease (HCC) AKI on CKD 3a recent baseline creat 1.2 in early Jan.  Improved to baseline with IV fluids.  Follow-up with PCP to resume rest of the home meds. Recent Labs  Lab 03/26/21 1144 03/27/21 0422 03/28/21 0348  BUN 29* 28* 22  CREATININE 2.07* 1.86* 1.32*    UTI (urinary tract infection)- (present on admission) Symptomatic with urinary frequency, UA grossly abnormal.  Treated with IV ceftriaxone.  Continues to oral antibiotics based on urine culture or duricef   History of atrial fibrillation Rate controlled.  Holding carvedilol due to hypotension during hospitalization will consider resuming Coreg at lower dose and continue home  Xarelto.  HTN (hypertension)- (present on admission) Hypotensive in the ED down to 70s.  Blood pressure  now uptrending we can resume Coreg at low dose for his afib-and continue to hold rest of the medication which she will follow-up with PCP to discuss and resume   PROSTATE CANCER, HX OF Stage IV.  Follows with Dr. Delton Coombes.  Patient no longer wants to continue Zytiga.         Consultants: None Procedures performed: None Disposition:  Stable to home Diet recommendation:    Previous diet  DISCHARGE MEDICATION: Allergies as of 03/28/2021   No Known Allergies      Medication List     STOP taking these medications    amLODipine 10 MG tablet Commonly known as: NORVASC   lisinopril 40 MG tablet Commonly known as: ZESTRIL       TAKE these medications    abiraterone acetate 250 MG tablet Commonly known as: ZYTIGA Take 2 tablets (500 mg total) by mouth daily. Take on an empty stomach 1 hour before or 2 hours after a meal   acetaminophen 650 MG CR tablet Commonly known as: TYLENOL Take 1,300 mg by mouth every 8 (eight) hours as needed for pain.   ascorbic acid 250 MG tablet Commonly known as: VITAMIN C TAKE ONE TABLET BY MOUTH THREE TIMES A DAY WITH MEALS - TAKE WITH IRON TO PROMOTE BETTER ABSORPTION   atorvastatin 20 MG tablet Commonly known as: LIPITOR Take 1 tablet (20 mg total) by mouth daily at 6 PM. What changed: when to take this   carvedilol 3.125 MG tablet Commonly known as: Coreg Take 1 tablet (3.125 mg total) by mouth 2 (two) times daily. What changed:  medication strength how much to take when to take this   cefadroxil 500 MG capsule Commonly known as: DURICEF Take 1 capsule (500 mg total) by mouth 2 (two) times daily for 3 days.   Cholecalciferol 25 MCG (1000 UT) tablet Take 1,000 Units by mouth daily.   diclofenac sodium 1 % Gel Commonly known as: VOLTAREN Apply 2 g topically 4 (four) times daily. What changed:  when to take this reasons to take this   Ensure Take 237 mLs by mouth daily as needed (nutrition). Vanilla or chocolate   ferrous sulfate 324 MG Tbec Take 324 mg by mouth 2 (two) times daily.   Flomax 0.4 MG Caps capsule Generic drug: tamsulosin Take 0.4 mg by mouth daily.   fluticasone 50 MCG/ACT nasal spray Commonly known as: FLONASE Place 2 sprays into both nostrils daily. What changed:  when to take this reasons to take this   furosemide 40 MG  tablet Commonly known as: LASIX TAKE ONE TABLET BY MOUTH ONCE EVERY DAY FOR BLOOD PRESSURE AND FOR FLUID CONTROL   megestrol 400 MG/10ML suspension Commonly known as: MEGACE Take 10 mLs (400 mg total) by mouth 2 (two) times daily.   ondansetron 8 MG disintegrating tablet Commonly known as: ZOFRAN-ODT Take 1 tablet (8 mg total) by mouth every 8 (eight) hours as needed for nausea or vomiting.   pantoprazole 40 MG tablet Commonly known as: Protonix Take 1 tablet (40 mg total) by mouth daily.   potassium chloride SA 20 MEQ tablet Commonly known as: KLOR-CON M Take 20 mEq by mouth daily.   predniSONE 5 MG tablet Commonly known as: DELTASONE Take 1 tablet (5 mg total) by mouth daily with breakfast.   rivaroxaban 20 MG Tabs tablet Commonly known as: XARELTO Take 1 tablet (20 mg total) by mouth daily with supper.   traMADol 50 MG tablet Commonly known as: ULTRAM Take 1  tablet (50 mg total) by mouth 2 (two) times daily.        [facility] Follow up in 1 week(s).   Specialty: General Practice Why: To resume your home meds Contact information: [place] [id number] [phone]         [facility] Follow [facility].   Specialty: [place] Why: [facility] staff will call you to schedule visits when approved by the [facility]                Discharge Exam: Filed Weights   03/26/21 1010  Weight: 80.7 kg   Condition at discharge: Stable  The results of significant diagnostics from this hospitalization (including imaging, microbiology, ancillary and laboratory) are listed below for reference.   Imaging Studies: DG Chest 2 View  Result Date: 03/06/2021 CLINICAL DATA:  Intermittent weakness EXAM: CHEST - 2 VIEW COMPARISON:  02/19/2021 FINDINGS: Transverse diameter of heart is slightly increased. Thoracic aorta is tortuous and ectatic. Lung fields are clear of any infiltrates or pulmonary  edema. Left hemidiaphragm is elevated. There is no pleural effusion or pneumothorax. Pacemaker battery is seen in the left infraclavicular region. Degenerative changes are noted in the right shoulder and right AC joint. IMPRESSION: No active cardiopulmonary disease. Electronically Signed   By: Elmer Picker M.D.   On: 03/06/2021 12:36   CT Head Wo Contrast  Result Date: 03/26/2021 CLINICAL DATA:  Syncope/presyncope, cerebrovascular cause suspected EXAM: CT HEAD WITHOUT CONTRAST TECHNIQUE: Contiguous axial images were obtained from the base of the skull through the vertex without intravenous contrast. RADIATION DOSE REDUCTION: This exam was performed according to the departmental dose-optimization program which includes automated exposure control, adjustment of the mA and/or kV according to patient size and/or use of iterative reconstruction technique. COMPARISON:  02/19/2021. FINDINGS: Brain: No evidence of acute infarction, hemorrhage, hydrocephalus, extra-axial collection or mass lesion/mass effect. Similar patchy white matter hypoattenuation, nonspecific but bowed chronic microvascular ischemic disease. Similar atrophy. Vascular: No hyperdense vessel identified. Calcific intracranial atherosclerosis. Skull: No acute fracture. Sinuses/Orbits: Clear visualized sinuses.  Unremarkable orbits. Other: No mastoid effusions. IMPRESSION: 1. No evidence of acute intracranial abnormality. 2. Similar chronic microvascular ischemic disease and cerebral atrophy (ICD10-G31.9). Electronically Signed   By: Margaretha Sheffield M.D.   On: 03/26/2021 15:46   DG Chest Port 1 View  Result Date: 03/26/2021 CLINICAL DATA:  Syncope and weakness. EXAM: PORTABLE CHEST 1 VIEW COMPARISON:  03/06/2021 and CT chest 02/03/2021. FINDINGS: Trachea is midline. Heart size stable. Pacemaker lead tips are in the right atrium and right ventricle. Thoracic aorta is calcified. Minimal bibasilar linear scarring. No airspace consolidation or  pleural fluid. IMPRESSION: No acute findings. Electronically Signed   By: Lorin Picket M.D.   On: 03/26/2021 11:23    Microbiology: Results for orders placed or performed during the hospital encounter of 03/26/21  Resp Panel by RT-PCR (Flu A&B, Covid) Nasopharyngeal Swab     Status: None   Collection Time: 03/26/21 11:01 AM   Specimen: Nasopharyngeal Swab; Nasopharyngeal(NP) swabs in vial transport medium  Result Value Ref Range Status   SARS Coronavirus 2 by RT PCR NEGATIVE NEGATIVE Final    Comment: (NOTE) SARS-CoV-2 target nucleic acids are NOT DETECTED.  The SARS-CoV-2 RNA is generally detectable in upper respiratory specimens during the acute phase of infection. The lowest concentration of SARS-CoV-2 viral copies this assay can detect is 138 copies/mL. A negative result does not preclude SARS-Cov-2  infection and should not be used as the sole basis for treatment or other patient management decisions. A negative result may occur with  improper specimen collection/handling, submission of specimen other than nasopharyngeal swab, presence of viral mutation(s) within the areas targeted by this assay, and inadequate number of viral copies(<138 copies/mL). A negative result must be combined with clinical observations, patient history, and epidemiological information. The expected result is Negative.  Fact Sheet for Patients:  EntrepreneurPulse.com.au  Fact Sheet for Healthcare Providers:  IncredibleEmployment.be  This test is no t yet approved or cleared by the [place] FDA and  has been authorized for detection and/or diagnosis of SARS-CoV-2 by FDA under an Emergency Use Authorization (EUA). This EUA will remain  in effect (meaning this test can be used) for the duration of the COVID-19 declaration under Section 564(b)(1) of the Act, 21 U.S.C.section 360bbb-3(b)(1), unless the authorization is terminated  or revoked sooner.        Influenza A by PCR NEGATIVE NEGATIVE Final   Influenza B by PCR NEGATIVE NEGATIVE Final    Comment: (NOTE) The Xpert Xpress SARS-CoV-2/FLU/RSV plus assay is intended as an aid in the diagnosis of influenza from Nasopharyngeal swab specimens and should not be used as a sole basis for treatment. Nasal washings and aspirates are unacceptable for Xpert Xpress SARS-CoV-2/FLU/RSV testing.  Fact Sheet for Patients: EntrepreneurPulse.com.au  Fact Sheet for Healthcare Providers: IncredibleEmployment.be  This test is not yet approved or cleared by the [place] FDA and has been authorized for detection and/or diagnosis of SARS-CoV-2 by FDA under an Emergency Use Authorization (EUA). This EUA will remain in effect (meaning this test can be used) for the duration of the COVID-19 declaration under Section 564(b)(1) of the Act, 21 U.S.C. section 360bbb-3(b)(1), unless the authorization is terminated or revoked.  Performed at [facility], [address] [place], [place] [address]   Urine Culture     Status: Abnormal   Collection Time: 03/26/21 11:01 AM   Specimen: Urine, Clean Catch  Result Value Ref Range Status   Specimen Description   Final    URINE, CLEAN CATCH Performed at [facility], [address], [place], [place] [address]    Special Requests   Final    NONE Performed at [facility], [address], [place], [place] [address]    Culture >=100,000 COLONIES/mL ESCHERICHIA COLI (A)  Final   Report Status 03/29/2021 FINAL  Final   Organism ID, Bacteria ESCHERICHIA COLI (A)  Final      Susceptibility   Escherichia coli - MIC*    AMPICILLIN 16 INTERMEDIATE Intermediate     CEFAZOLIN <=4 SENSITIVE Sensitive     CEFEPIME <=0.12 SENSITIVE Sensitive     CEFTRIAXONE <=0.25 SENSITIVE Sensitive     CIPROFLOXACIN <=0.25 SENSITIVE Sensitive     GENTAMICIN <=1 SENSITIVE Sensitive     IMIPENEM <=0.25 SENSITIVE Sensitive     NITROFURANTOIN <=16  SENSITIVE Sensitive     TRIMETH/SULFA <=20 SENSITIVE Sensitive     AMPICILLIN/SULBACTAM 4 SENSITIVE Sensitive     PIP/TAZO <=4 SENSITIVE Sensitive     * >=100,000 COLONIES/mL ESCHERICHIA COLI  Blood culture (routine x 2)     Status: None (Preliminary result)   Collection Time: 03/26/21  4:27 PM   Specimen: Right Antecubital; Blood  Result Value Ref Range Status   Specimen Description RIGHT ANTECUBITAL  Final   Special Requests   Final    BOTTLES DRAWN AEROBIC AND ANAEROBIC Blood Culture adequate volume   Culture  Final    NO GROWTH 4 DAYS Performed at [facility], [address], [place], [place] [address]    Report Status PENDING  Incomplete  Blood culture (routine x 2)     Status: None (Preliminary result)   Collection Time: 03/26/21  4:28 PM   Specimen: BLOOD RIGHT HAND  Result Value Ref Range Status   Specimen Description BLOOD RIGHT HAND  Final   Special Requests   Final    BOTTLES DRAWN AEROBIC ONLY Blood Culture results may not be optimal due to an inadequate volume of blood received in culture bottles   Culture   Final    NO GROWTH 4 DAYS Performed at [facility], [address], [place], [place] [address]    Report Status PENDING  Incomplete    Labs: CBC: Recent Labs  Lab 03/26/21 1144 03/27/21 0422  WBC 8.9 6.0  NEUTROABS 6.1  --   HGB 11.6* 9.9*  HCT 34.9* 29.6*  MCV 104.2* 103.1*  PLT 197 536   Basic Metabolic Panel: Recent Labs  Lab 03/26/21 1144 03/27/21 0422 03/28/21 0348  NA 133* 135 137  K 4.0 4.0 4.0  CL 104 109 111  CO2 19* 20* 20*  GLUCOSE 95 80 84  BUN 29* 28* 22  CREATININE 2.07* 1.86* 1.32*  CALCIUM 9.0 8.6* 8.5*   Liver Function Tests: Recent Labs  Lab 03/26/21 1144  AST 18  ALT 15  ALKPHOS 38  BILITOT 1.3*  PROT 6.4*  ALBUMIN 3.1*   CBG: No results for input(s): GLUCAP in the last 168 hours.  Discharge time spent: 35 minutes.  Signed: Antonieta Pert, MD [facility] 03/30/2021

## 2021-03-31 LAB — CULTURE, BLOOD (ROUTINE X 2)
Culture: NO GROWTH
Culture: NO GROWTH
Special Requests: ADEQUATE

## 2021-04-02 ENCOUNTER — Telehealth (HOSPITAL_COMMUNITY): Payer: Self-pay | Admitting: *Deleted

## 2021-04-02 NOTE — Telephone Encounter (Signed)
Derrick Long's daughter Derrick Long was contacted by telephone to verify understanding of discharge instructions status post their most recent discharge from the hospital on the date:  03/28/21.  Inpatient discharge AVS was re-reviewed with patient, along with cancer center appointments.  Verification of understanding for oncology specific follow-up was validated using the Teach Back method.  Stated that they do have [place] in place at this time and he is doing well.  Transportation to appointments were confirmed for the patient as being self/caregiver.  [name] questions were addressed to their satisfaction upon completion of this post discharge follow-up call for outpatient oncology.

## 2021-04-04 ENCOUNTER — Other Ambulatory Visit (HOSPITAL_COMMUNITY): Payer: Self-pay

## 2021-04-09 ENCOUNTER — Ambulatory Visit: Payer: No Typology Code available for payment source | Admitting: Urology

## 2021-04-14 ENCOUNTER — Inpatient Hospital Stay (HOSPITAL_COMMUNITY): Payer: Medicare Other

## 2021-04-14 DIAGNOSIS — C641 Malignant neoplasm of right kidney, except renal pelvis: Secondary | ICD-10-CM

## 2021-04-14 DIAGNOSIS — C61 Malignant neoplasm of prostate: Secondary | ICD-10-CM | POA: Diagnosis not present

## 2021-04-14 DIAGNOSIS — E278 Other specified disorders of adrenal gland: Secondary | ICD-10-CM

## 2021-04-14 LAB — CBC WITH DIFFERENTIAL/PLATELET
Abs Immature Granulocytes: 0.02 10*3/uL (ref 0.00–0.07)
Basophils Absolute: 0 10*3/uL (ref 0.0–0.1)
Basophils Relative: 0 %
Eosinophils Absolute: 0.1 10*3/uL (ref 0.0–0.5)
Eosinophils Relative: 1 %
HCT: 32.8 % — ABNORMAL LOW (ref 39.0–52.0)
Hemoglobin: 11.3 g/dL — ABNORMAL LOW (ref 13.0–17.0)
Immature Granulocytes: 0 %
Lymphocytes Relative: 33 %
Lymphs Abs: 2 10*3/uL (ref 0.7–4.0)
MCH: 35.9 pg — ABNORMAL HIGH (ref 26.0–34.0)
MCHC: 34.5 g/dL (ref 30.0–36.0)
MCV: 104.1 fL — ABNORMAL HIGH (ref 80.0–100.0)
Monocytes Absolute: 0.7 10*3/uL (ref 0.1–1.0)
Monocytes Relative: 13 %
Neutro Abs: 3.1 10*3/uL (ref 1.7–7.7)
Neutrophils Relative %: 53 %
Platelets: 199 10*3/uL (ref 150–400)
RBC: 3.15 MIL/uL — ABNORMAL LOW (ref 4.22–5.81)
RDW: 15.2 % (ref 11.5–15.5)
WBC: 5.9 10*3/uL (ref 4.0–10.5)
nRBC: 0 % (ref 0.0–0.2)

## 2021-04-14 LAB — COMPREHENSIVE METABOLIC PANEL
ALT: 15 U/L (ref 0–44)
AST: 18 U/L (ref 15–41)
Albumin: 3.3 g/dL — ABNORMAL LOW (ref 3.5–5.0)
Alkaline Phosphatase: 45 U/L (ref 38–126)
Anion gap: 5 (ref 5–15)
BUN: 22 mg/dL (ref 8–23)
CO2: 23 mmol/L (ref 22–32)
Calcium: 8.7 mg/dL — ABNORMAL LOW (ref 8.9–10.3)
Chloride: 106 mmol/L (ref 98–111)
Creatinine, Ser: 1.34 mg/dL — ABNORMAL HIGH (ref 0.61–1.24)
GFR, Estimated: 50 mL/min — ABNORMAL LOW (ref >60)
Glucose, Bld: 86 mg/dL (ref 70–99)
Potassium: 3.9 mmol/L (ref 3.5–5.1)
Sodium: 134 mmol/L — ABNORMAL LOW (ref 135–145)
Total Bilirubin: 1.2 mg/dL (ref 0.3–1.2)
Total Protein: 6.7 g/dL (ref 6.5–8.1)

## 2021-04-14 LAB — PSA: Prostatic Specific Antigen: 3.98 ng/mL (ref 0.00–4.00)

## 2021-04-16 ENCOUNTER — Other Ambulatory Visit: Payer: Self-pay

## 2021-04-16 ENCOUNTER — Ambulatory Visit (INDEPENDENT_AMBULATORY_CARE_PROVIDER_SITE_OTHER): Payer: No Typology Code available for payment source | Admitting: Urology

## 2021-04-16 ENCOUNTER — Encounter: Payer: Self-pay | Admitting: Urology

## 2021-04-16 VITALS — BP 183/82 | HR 79

## 2021-04-16 DIAGNOSIS — N401 Enlarged prostate with lower urinary tract symptoms: Secondary | ICD-10-CM | POA: Diagnosis not present

## 2021-04-16 DIAGNOSIS — R350 Frequency of micturition: Secondary | ICD-10-CM

## 2021-04-16 DIAGNOSIS — N3001 Acute cystitis with hematuria: Secondary | ICD-10-CM

## 2021-04-16 DIAGNOSIS — N138 Other obstructive and reflux uropathy: Secondary | ICD-10-CM

## 2021-04-16 LAB — URINALYSIS, ROUTINE W REFLEX MICROSCOPIC
Bilirubin, UA: NEGATIVE
Glucose, UA: NEGATIVE
Ketones, UA: NEGATIVE
Nitrite, UA: NEGATIVE
Specific Gravity, UA: 1.02 (ref 1.005–1.030)
Urobilinogen, Ur: 1 mg/dL (ref 0.2–1.0)
pH, UA: 6 (ref 5.0–7.5)

## 2021-04-16 LAB — MICROSCOPIC EXAMINATION
Renal Epithel, UA: NONE SEEN /hpf
WBC, UA: >30 /hpf — AB (ref 0–5)

## 2021-04-16 LAB — BLADDER SCAN AMB NON-IMAGING: Scan Result: 136

## 2021-04-16 MED ORDER — SILODOSIN 8 MG PO CAPS: 8.0000 mg | ORAL_CAPSULE | Freq: Every day | ORAL | 11 refills | Status: DC

## 2021-04-16 MED ORDER — SULFAMETHOXAZOLE-TRIMETHOPRIM 400-80 MG PO TABS: 1.0000 | ORAL_TABLET | Freq: Two times a day (BID) | ORAL | 0 refills | Status: DC

## 2021-04-16 NOTE — Progress Notes (Signed)
post void residual=136 ?

## 2021-04-16 NOTE — Patient Instructions (Signed)

## 2021-04-16 NOTE — Addendum Note (Signed)
Addended by: Cleon Gustin on: 04/16/2021 09:01 AM ? ? Modules accepted: Orders ? ?

## 2021-04-16 NOTE — Progress Notes (Signed)
04/16/2021 8:50 AM   Derrick Long 05/06/1926 [id number]  Referring provider: Center, [facility] [address] [place],  [place] [address]  Urinary frequency and UTI   HPI: Mr Derrick Long is a 88yo here for evaluation and urinary frequency and UTI. His first UTI was 3 weeks ago and he was admitted to [facility] from 2/8-2/10. Urine culture grew ecoli and patient was treated with duracef. IPSS 23 QOL 5 on flomax 0.8mg  daily. He has a weak urinary stream. Nocturia 5-6x. He has straining to urinate. No gross hematuria or dysuria. UA today is concerning for infection. PVR 136.    PMH: Past Medical History:  Diagnosis Date   Acid reflux    Anemia    Colon cancer ([place])    per patient, colon cancer more than 10 years ago s/p right hemicolectomy   Ex-cigarette smoker    Gout    Hyperlipemia    Hypertension    Paroxysmal atrial fibrillation ([place]) 2014   Prostate cancer ([place]) 08/08/2013   SEEDS 20 YEARS AGO   Prostate disease     Surgical History: Past Surgical History:  Procedure Laterality Date   BIOPSY  02/05/2021   Procedure: BIOPSY;  Surgeon: Montez Morita, Quillian Quince, MD;  [facility]: [facility];  Service: Gastroenterology;;   COLON SURGERY     Right hemicolectomy in the setting of colon cancer, per patient   ESOPHAGOGASTRODUODENOSCOPY (EGD) WITH PROPOFOL N/A 02/05/2021   Procedure: ESOPHAGOGASTRODUODENOSCOPY (EGD) WITH PROPOFOL;  Surgeon: Harvel Quale, MD;  [facility]: [facility];  Service: Gastroenterology;  Laterality: N/A;   LEFT HEART CATHETERIZATION WITH CORONARY ANGIOGRAM N/A 07/21/2012   Procedure: LEFT HEART CATHETERIZATION WITH CORONARY ANGIOGRAM;  Surgeon: Troy Sine, MD;  Location: [facility] CATH LAB;  Service: Cardiovascular;  Laterality: N/A;   PACEMAKER INSERTION     PERMANENT PACEMAKER INSERTION N/A 07/22/2012   Procedure: PERMANENT PACEMAKER INSERTION;  Surgeon: Sanda Klein, MD;  Location: [facility] [place] [facility] CATH LAB;  Service: Cardiovascular;  Laterality: N/A;    PROSTATE SURGERY      Home Medications:  Allergies as of 04/16/2021   No Known Allergies      Medication List        Accurate as of April 16, 2021  8:50 AM. If you have any questions, ask your nurse or doctor.          abiraterone acetate 250 MG tablet Commonly known as: ZYTIGA Take 2 tablets (500 mg total) by mouth daily. Take on an empty stomach 1 hour before or 2 hours after a meal   acetaminophen 650 MG CR tablet Commonly known as: TYLENOL Take 1,300 mg by mouth every 8 (eight) hours as needed for pain.   ascorbic acid 250 MG tablet Commonly known as: VITAMIN C TAKE ONE TABLET BY MOUTH THREE TIMES A DAY WITH MEALS - TAKE WITH IRON TO PROMOTE BETTER ABSORPTION   atorvastatin 20 MG tablet Commonly known as: LIPITOR Take 1 tablet (20 mg total) by mouth daily at 6 PM. What changed: when to take this   carvedilol 3.125 MG tablet Commonly known as: Coreg Take 1 tablet (3.125 mg total) by mouth 2 (two) times daily.   Cholecalciferol 25 MCG (1000 UT) tablet Take 1,000 Units by mouth daily.   diclofenac sodium 1 % Gel Commonly known as: VOLTAREN Apply 2 g topically 4 (four) times daily. What changed:  when to take this reasons to take this   Ensure Take 237 mLs by mouth daily as needed (nutrition). Vanilla  or chocolate   ferrous sulfate 324 MG Tbec Take 324 mg by mouth 2 (two) times daily.   fluticasone 50 MCG/ACT nasal spray Commonly known as: FLONASE Place 2 sprays into both nostrils daily. What changed:  when to take this reasons to take this   furosemide 40 MG tablet Commonly known as: LASIX TAKE ONE TABLET BY MOUTH ONCE EVERY DAY FOR BLOOD PRESSURE AND FOR FLUID CONTROL   megestrol 400 MG/10ML suspension Commonly known as: MEGACE Take 10 mLs (400 mg total) by mouth 2 (two) times daily.   ondansetron 8 MG disintegrating tablet Commonly known as: ZOFRAN-ODT Take 1 tablet (8 mg total) by mouth every 8 (eight) hours as needed for nausea or  vomiting.   pantoprazole 40 MG tablet Commonly known as: Protonix Take 1 tablet (40 mg total) by mouth daily.   potassium chloride SA 20 MEQ tablet Commonly known as: KLOR-CON M Take 20 mEq by mouth daily.   predniSONE 5 MG tablet Commonly known as: DELTASONE Take 1 tablet (5 mg total) by mouth daily with breakfast.   rivaroxaban 20 MG Tabs tablet Commonly known as: XARELTO Take 1 tablet (20 mg total) by mouth daily with supper.   tamsulosin 0.4 MG Caps capsule Commonly known as: FLOMAX Take 0.4 mg by mouth daily.   traMADol 50 MG tablet Commonly known as: ULTRAM Take 1 tablet (50 mg total) by mouth 2 (two) times daily.        Allergies: No Known Allergies  Family History: Family History  Problem Relation Age of Onset   Cancer Neg Hx     Social History:  reports that he quit smoking about 18 years ago. He has never used smokeless tobacco. He reports that he does not drink alcohol and does not use drugs.  ROS: All other review of systems were reviewed and are negative except what is noted above in HPI  Physical Exam: BP (!) 183/82    Pulse 79   Constitutional:  Alert and oriented, No acute distress. HEENT: [place] AT, moist mucus membranes.  Trachea midline, no masses. Cardiovascular: No clubbing, cyanosis, or edema. Respiratory: Normal respiratory effort, no increased work of breathing. GI: Abdomen is soft, nontender, nondistended, no abdominal masses GU: No CVA tenderness.  Lymph: No cervical or inguinal lymphadenopathy. Skin: No rashes, bruises or suspicious lesions. Neurologic: Grossly intact, no focal deficits, moving all 4 extremities. Psychiatric: Normal mood and affect.  Laboratory Data: Lab Results  Component Value Date   WBC 5.9 04/14/2021   HGB 11.3 (L) 04/14/2021   HCT 32.8 (L) 04/14/2021   MCV 104.1 (H) 04/14/2021   PLT 199 04/14/2021    Lab Results  Component Value Date   CREATININE 1.34 (H) 04/14/2021    Lab Results  Component Value  Date   PSA Refused 12/22/2007   PSA 0.07 (L) 06/24/2007   PSA 0.10 05/25/2006    No results found for: TESTOSTERONE  Lab Results  Component Value Date   HGBA1C 5.6 07/19/2012    Urinalysis    Component Value Date/Time   COLORURINE YELLOW 03/26/2021 1101   APPEARANCEUR CLOUDY (A) 03/26/2021 1101   APPEARANCEUR Clear 01/20/2021 0938   LABSPEC 1.010 03/26/2021 1101   PHURINE 5.0 03/26/2021 1101   GLUCOSEU NEGATIVE 03/26/2021 1101   HGBUR MODERATE (A) 03/26/2021 1101   HGBUR negative 06/24/2007 [place] 03/26/2021 1101   BILIRUBINUR Negative 01/20/2021 0938   KETONESUR NEGATIVE 03/26/2021 1101   PROTEINUR 30 (A) 03/26/2021 1101   UROBILINOGEN  1.0 07/29/2013 2032   NITRITE NEGATIVE 03/26/2021 1101   LEUKOCYTESUR LARGE (A) 03/26/2021 1101    Lab Results  Component Value Date   LABMICR Comment 01/20/2021   BACTERIA RARE (A) 03/26/2021    Pertinent Imaging:  Results for orders placed in visit on 07/29/01  DG Abd 1 View  Narrative FINDINGS CLINICAL DATA:  PROSTATE CA. SINGLE VIEW ABDOMEN A NORMAL BOWEL GAS PATTERN IS DEMONSTRATED.  MILD TO MODERATE SPUR FORMATION IS DEMONSTRATED AT MULTIPLE LEVELS OF THE LUMBAR SPINE AS WELL AS MINIMAL LEVOCONVEX LUMBAR ROTARY SCOLIOSIS.  ALSO NOTED IS BRIDGING OSSIFICATION OR CALCIFICATION BETWEEN THE LOWER LUMBAR SPINE AND LEFT ILIAC WING. A TRANSITIONAL LUMBOSACRAL VERTEBRA IS ALSO DEMONSTRATED.  MILD ATHEROMATOUS ARTERIAL CALCIFICATIONS ARE NOTED. IMPRESSION NO ACUTE ABNORMALITY AND NO EVIDENCE OF BONY METASTATIC DISEASE.  No results found for this or any previous visit.  No results found for this or any previous visit.  No results found for this or any previous visit.  Results for orders placed during the hospital encounter of 08/01/13  US Renal  Narrative CLINICAL DATA:  Acute urinary retention  EXAM: RENAL/URINARY TRACT ULTRASOUND COMPLETE  COMPARISON:  04/14/2004  FINDINGS: Right  Kidney:  Length: 10.2 cm. Complex 2.0 x 1.8 x 1.6 cm echogenic lesion, right kidney lower pole. We were not able the demonstrate definite blood flow within this lesion. Hypoechoic but minimally complex 2.6 x 2.2 x 1.9 cm lesion, right kidney lower pole.  Left Kidney:  Length: 1.3 cm. Mild prominence of renal sinus adipose tissue. Tiny probable cyst, left mid-upper kidney, 4 mm. Equivocal small mid kidney nonobstructive calculus.  Bladder:  Nondistended.  Catheter in urinary bladder.  IMPRESSION: 1. Complex 2 cm echogenic lesion, right kidney lower pole, in vicinity of prior enhancing nodule. In 2006 this lesion measured 1.4 cm. This may represent slow growth of a small renal cell carcinoma. 2. Minimally complex 2.6 cm right kidney lower pole lesion, most likely a complex cyst although technically nonspecific. 3. Equivocal small left mid kidney nonobstructive calculus. Mild renal sinus adipose tissue prominence on the left.   Electronically Signed By: Sherryl Barters M.D. On: 08/01/2013 15:54  No results found for this or any previous visit.  No results found for this or any previous visit.  Results for orders placed during the hospital encounter of 02/19/21  CT Renal Stone Study  Narrative CLINICAL DATA:  Urinary retention.  History of prostate cancer.  EXAM: CT ABDOMEN AND PELVIS WITHOUT CONTRAST  TECHNIQUE: Multidetector CT imaging of the abdomen and pelvis was performed following the standard protocol without IV contrast.  COMPARISON:  02/03/2021.  FINDINGS: Lower chest: The heart is normal in size and scattered coronary artery calcifications are noted. Pacemaker leads are present in the heart. A 4 mm nodule is present in the right lower lobe, axial image 8.  Hepatobiliary: The liver is within normal limits. Mild intrahepatic and extrahepatic biliary ductal dilatation is seen which is likely due to patient's post cholecystectomy status.  Pancreas:  Unremarkable. No pancreatic ductal dilatation or surrounding inflammatory changes.  Spleen: Normal in size without focal abnormality.  Adrenals/Urinary Tract: There is redemonstration of a right adrenal mass measuring 3.2 cm, unchanged. The left adrenal gland is within normal limits. Scattered hypo and hyperdense structures are present in the kidneys bilaterally. Complex masslike lesions are noted in the kidneys bilaterally measuring 2.6 cm in the mid left kidney and 2.6 cm in the mid right kidney. No renal or ureteral calculus or obstructive uropathy bilaterally.  The bladder is unremarkable.  Stomach/Bowel: The stomach is nondistended. No bowel obstruction, free air or pneumatosis. A few scattered diverticular noted along the colon without evidence of diverticulitis. Right hemicolectomy changes are noted.  Vascular/Lymphatic: Aortic atherosclerosis with ectasia of the distal abdominal aorta. 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 The common iliac artery on the left measures 1.8 cm. No abdominal or pelvic lymphadenopathy.  Reproductive: The prostate gland is enlarged and contains multiple radiopaque brachytherapy seeds.  Other: No free fluid. A small fat containing umbilical hernia is noted. There is a small right inguinal fat containing hernia.  Musculoskeletal: Degenerative changes are present in the thoracolumbar spine and bilateral hips. A stable sclerotic lesion is noted in the L2 vertebral body on the right, possible bone island.  IMPRESSION: 1. No renal or ureteral calculus or obstructive uropathy bilaterally. 2. Multiple hyperdense and hypodense lesions in the kidneys bilaterally. Complex masses are noted in the mid kidneys bilaterally, compatible with history of multifocal renal cell carcinoma and slightly increased in size from the prior exam. 3. Enlarged prostate gland containing brachytherapy seeds. 4. Right lower  lobe pulmonary nodule. No follow-up needed if patient is low-risk. Non-contrast chest CT can be considered in 12 months if patient is high-risk. This recommendation follows the consensus statement: Guidelines for Management of Incidental Pulmonary Nodules Detected on CT Images: From the Fleischner Society 2017; Radiology 2017; 284:228-243. 5. Aortic atherosclerosis. 6. Coronary artery calcifications. 7. Remaining chronic findings as described above.   Electronically Signed By: Brett Fairy M.D. On: 02/20/2021 00:28   Assessment & Plan:    1. Urine frequency -We will start rapaflo 8mg  qhs - BLADDER SCAN AMB NON-IMAGING - Urinalysis, Routine w reflex microscopic  2. Benign prostatic hyperplasia with urinary obstruction -Rapaflo 8mg  qhs  3. Acute cystitis with hematuria -Urine for culture Bactrim SS BID for 14 days   No follow-ups on file.  Nicolette Bang, MD  [facility] [place]

## 2021-04-17 ENCOUNTER — Encounter (HOSPITAL_COMMUNITY): Payer: Self-pay | Admitting: Hematology

## 2021-04-17 ENCOUNTER — Other Ambulatory Visit: Payer: Self-pay

## 2021-04-17 ENCOUNTER — Inpatient Hospital Stay (HOSPITAL_COMMUNITY): Payer: Medicare Other | Attending: Hematology | Admitting: Hematology

## 2021-04-17 VITALS — BP 155/83 | HR 83 | Temp 98.2°F | Resp 16 | Wt 184.0 lb

## 2021-04-17 DIAGNOSIS — Z87440 Personal history of urinary (tract) infections: Secondary | ICD-10-CM | POA: Insufficient documentation

## 2021-04-17 DIAGNOSIS — N390 Urinary tract infection, site not specified: Secondary | ICD-10-CM | POA: Diagnosis not present

## 2021-04-17 DIAGNOSIS — I129 Hypertensive chronic kidney disease with stage 1 through stage 4 chronic kidney disease, or unspecified chronic kidney disease: Secondary | ICD-10-CM | POA: Diagnosis not present

## 2021-04-17 DIAGNOSIS — C7971 Secondary malignant neoplasm of right adrenal gland: Secondary | ICD-10-CM | POA: Insufficient documentation

## 2021-04-17 DIAGNOSIS — D508 Other iron deficiency anemias: Secondary | ICD-10-CM | POA: Diagnosis not present

## 2021-04-17 DIAGNOSIS — C61 Malignant neoplasm of prostate: Secondary | ICD-10-CM | POA: Insufficient documentation

## 2021-04-17 DIAGNOSIS — N182 Chronic kidney disease, stage 2 (mild): Secondary | ICD-10-CM | POA: Insufficient documentation

## 2021-04-17 NOTE — Patient Instructions (Addendum)
[facility] ?Discharge Instructions ? ?You were seen and examined today by Dr. Katragadda. He reviewed your most recent labs and everything looks stable. Please keep follow up appointments as scheduled.  ? ? ?Thank you for choosing [facility] at [facility] to provide your oncology and hematology care.  To afford each patient quality time with our provider, please arrive at least 15 minutes before your scheduled appointment time.  ? ?If you have a lab appointment with the Cancer Center please come in thru the Main Entrance and check in at the main information desk. ? ?You need to re-schedule your appointment should you arrive 10 or more minutes late.  We strive to give you quality time with our providers, and arriving late affects you and other patients whose appointments are after yours.  Also, if you no show three or more times for appointments you may be dismissed from the clinic at the providers discretion.     ?Again, thank you for choosing [facility].  Our hope is that these requests will decrease the amount of time that you wait before being seen by our physicians.       ?_____________________________________________________________ ? ?Should you have questions after your visit to [facility], please contact our office at [phone] and follow the prompts.  Our office hours are 8:00 a.m. and 4:30 p.m. Monday - Friday.  Please note that voicemails left after 4:00 p.m. may not be returned until the following business day.  We are closed weekends and major holidays.  You do have access to a nurse 24-7, just call the main number to the clinic [phone] and do not press any options, hold on the line and a nurse will answer the phone.   ? ?For prescription refill requests, have your pharmacy contact our office and allow 72 hours.   ? ?Due to Covid, you will need to wear a mask upon entering the hospital. If you do not have  a mask, a mask will be given to you at the Main Entrance upon arrival. For doctor visits, patients may have 1 support person age 18 or older with them. For treatment visits, patients can not have anyone with them due to social distancing guidelines and our immunocompromised population.  ? ?  ?

## 2021-04-17 NOTE — Progress Notes (Signed)
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    CANCER STAGING:  Cancer Staging  Prostate cancer [facility]) Staging form: Prostate, AJCC 8th Edition - Clinical stage from 07/10/2020: Stage IVB (cTX, cNX, pM1c, PSA: 10.2) - Unsigned   INTERVAL HISTORY:  [name], a 88 y.o. 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 [name] was last seen on 03/20/2021.   Today he reports feeling fair, and he is accompanied by his daughter. He started bactrim yesterday for UTI; he reports improved odor of his urine, and he had 1 episode of incontinence yesterday. He denies fatigue. He continues to take Megace. He is taking Zytiga and is tolerating it well. He was hospitalized from 02/08-02/10 at which time his daughter is unsure if he received Zytiga, but she otherwise denies him missing any doses. He continues to take 5 mg of prednisone. His appetite is good. His daughter reports that last week, prior to his UTI, his energy has excellent; he was driving and visiting family and friends. His daughter reports he has been more emotional lately with intermittently increased anxiety. He reports continued right sided back pain in the morning.   REVIEW OF SYSTEMS:  Review of Systems  Constitutional:  Negative for appetite change and fatigue.  Genitourinary:  Positive for bladder incontinence.        UTI  All other systems reviewed and are negative.  PAST MEDICAL/SURGICAL HISTORY:  Past Medical History:  Diagnosis Date   Acid reflux     Anemia    Colon cancer ([place])    per patient, colon cancer more than 10 years ago s/p right hemicolectomy   Ex-cigarette smoker    Gout    Hyperlipemia    Hypertension    Paroxysmal atrial fibrillation ([place]) 2014   Prostate cancer ([place]) 08/08/2013   SEEDS 20 YEARS AGO   Prostate disease    Past Surgical History:  Procedure Laterality Date   BIOPSY  02/05/2021   Procedure: BIOPSY;  Surgeon: Montez Morita, Quillian Quince, MD;  Location: [facility];  Service: Gastroenterology;;   COLON SURGERY     Right hemicolectomy in the setting of colon cancer, per patient   ESOPHAGOGASTRODUODENOSCOPY (EGD) WITH PROPOFOL N/A 02/05/2021   Procedure: ESOPHAGOGASTRODUODENOSCOPY (EGD) WITH PROPOFOL;  Surgeon: Harvel Quale, MD;  Location: [facility];  Service: Gastroenterology;  Laterality: N/A;   LEFT HEART CATHETERIZATION WITH CORONARY ANGIOGRAM N/A 07/21/2012   Procedure: LEFT HEART CATHETERIZATION WITH CORONARY ANGIOGRAM;  Surgeon: Troy Sine, MD;  Location: [facility] CATH LAB;  Service: Cardiovascular;  Laterality: N/A;   PACEMAKER INSERTION     PERMANENT PACEMAKER INSERTION N/A 07/22/2012   Procedure: PERMANENT PACEMAKER INSERTION;  Surgeon: Sanda Klein, MD;  Location: [facility] CATH LAB;  Service: Cardiovascular;  Laterality: N/A;   PROSTATE SURGERY      SOCIAL HISTORY:  Social History   Socioeconomic History   Marital status: Married    Spouse name: Not on file   Number of children: Not on file   Years of education: Not on file  Highest education level: Not on file  Occupational History   Not on file  Tobacco Use   Smoking status: Former    Types: Cigarettes    Quit date: 07/20/2002    Years since quitting: 18.7   Smokeless tobacco: Never  Vaping Use   Vaping Use: Never used  Substance and Sexual Activity   Alcohol use: No   Drug use: No   Sexual activity: Not on file  Other Topics Concern   Not on file  Social History Narrative   Not on file   Social Determinants of  Health   Financial Resource Strain: Low Risk    Difficulty of Paying Living Expenses: Not hard at all  Food Insecurity: No Food Insecurity   Worried About Charity fundraiser in the Last Year: Never true   Hope in the Last Year: Never true  Transportation Needs: No Transportation Needs   Lack of Transportation (Medical): No   Lack of Transportation (Non-Medical): No  Physical Activity: Insufficiently Active   Days of Exercise per Week: 3 days   Minutes of Exercise per Session: 30 min  Stress: No Stress Concern Present   Feeling of Stress : Not at all  Social Connections: Moderately Integrated   Frequency of Communication with Friends and Family: More than three times a week   Frequency of Social Gatherings with Friends and Family: More than three times a week   Attends Religious Services: More than 4 times per year   Active Member of Clubs or Organizations: No   Attends Archivist Meetings: 1 to 4 times per year   Marital Status: Widowed  Human resources officer Violence: Not At Risk   Fear of Current or Ex-Partner: No   Emotionally Abused: No   Physically Abused: No   Sexually Abused: No    FAMILY HISTORY:  Family History  Problem Relation Age of Onset   Cancer Neg Hx     CURRENT MEDICATIONS:  Current Outpatient Medications  Medication Sig Dispense Refill   abiraterone acetate (ZYTIGA) 250 MG tablet Take 2 tablets (500 mg total) by mouth daily. Take on an empty stomach 1 hour before or 2 hours after a meal 120 tablet 3   acetaminophen (TYLENOL) 650 MG CR tablet Take 1,300 mg by mouth every 8 (eight) hours as needed for pain.     ascorbic acid (VITAMIN C) 250 MG tablet TAKE ONE TABLET BY MOUTH THREE TIMES A DAY WITH MEALS - TAKE WITH IRON TO PROMOTE BETTER ABSORPTION     atorvastatin (LIPITOR) 20 MG tablet Take 1 tablet (20 mg total) by mouth daily at 6 PM. (Patient taking differently: Take 20 mg by mouth every evening.) 30 tablet 5   carvedilol (COREG) 3.125  MG tablet Take 1 tablet (3.125 mg total) by mouth 2 (two) times daily. 60 tablet 0   Cholecalciferol 25 MCG (1000 UT) tablet Take 1,000 Units by mouth daily.     diclofenac sodium (VOLTAREN) 1 % GEL Apply 2 g topically 4 (four) times daily. (Patient taking differently: Apply 2 g topically 4 (four) times daily as needed (pain).) 100 g 0   Ensure (ENSURE) Take 237 mLs by mouth daily as needed (nutrition). Vanilla or chocolate     ferrous sulfate 324 MG TBEC Take 324 mg by mouth 2 (two) times daily.     fluticasone (FLONASE) 50 MCG/ACT nasal spray Place 2 sprays into both nostrils daily. (Patient taking differently: Place 2 sprays into both nostrils daily  as needed for allergies.) 16 g 2   furosemide (LASIX) 40 MG tablet TAKE ONE TABLET BY MOUTH ONCE EVERY DAY FOR BLOOD PRESSURE AND FOR FLUID CONTROL     megestrol (MEGACE) 400 MG/10ML suspension Take 10 mLs (400 mg total) by mouth 2 (two) times daily. 480 mL 2   pantoprazole (PROTONIX) 40 MG tablet Take 1 tablet (40 mg total) by mouth daily. 30 tablet 3   potassium chloride SA (KLOR-CON) 20 MEQ tablet Take 20 mEq by mouth daily.     predniSONE (DELTASONE) 5 MG tablet Take 1 tablet (5 mg total) by mouth daily with breakfast. 30 tablet 6   rivaroxaban (XARELTO) 20 MG TABS tablet Take 1 tablet (20 mg total) by mouth daily with supper. 20 tablet 0   silodosin (RAPAFLO) 8 MG CAPS capsule Take 1 capsule (8 mg total) by mouth at bedtime. 30 capsule 11   sulfamethoxazole-trimethoprim (BACTRIM) 400-80 MG tablet Take 1 tablet by mouth 2 (two) times daily. 28 tablet 0   traMADol (ULTRAM) 50 MG tablet Take 1 tablet (50 mg total) by mouth 2 (two) times daily. 60 tablet 0   No current facility-administered medications for this visit.    ALLERGIES:  No Known Allergies  PHYSICAL EXAM:  Performance status (ECOG): 1 - Symptomatic but completely ambulatory  Vitals:   04/17/21 1500  BP: (!) 155/83  Pulse: 83  Resp: 16  Temp: 98.2 F (36.8 C)  SpO2: 100%    Wt Readings from Last 3 Encounters:  04/17/21 184 lb (83.5 kg)  03/26/21 178 lb (80.7 kg)  03/20/21 177 lb 0.5 oz (80.3 kg)   Physical Exam Vitals reviewed.  Constitutional:      Appearance: Normal appearance.  Cardiovascular:     Rate and Rhythm: Normal rate and regular rhythm.     Pulses: Normal pulses.     Heart sounds: Normal heart sounds.  Pulmonary:     Effort: Pulmonary effort is normal.     Breath sounds: Normal breath sounds.  Neurological:     General: No focal deficit present.     Mental Status: He is alert and oriented to person, place, and time.  Psychiatric:        Mood and Affect: Mood normal.        Behavior: Behavior normal.     LABORATORY DATA:  I have reviewed the labs as listed.  CBC Latest Ref Rng & Units 04/14/2021 03/27/2021 03/26/2021  WBC 4.0 - 10.5 K/uL 5.9 6.0 8.9  Hemoglobin 13.0 - 17.0 g/dL 11.3(L) 9.9(L) 11.6(L)  Hematocrit 39.0 - 52.0 % 32.8(L) 29.6(L) 34.9(L)  Platelets 150 - 400 K/uL 199 194 197   CMP Latest Ref Rng & Units 04/14/2021 03/28/2021 03/27/2021  Glucose 70 - 99 mg/dL 86 84 80  BUN 8 - 23 mg/dL 22 22 28(H)  Creatinine 0.61 - 1.24 mg/dL 1.34(H) 1.32(H) 1.86(H)  Sodium 135 - 145 mmol/L 134(L) 137 135  Potassium 3.5 - 5.1 mmol/L 3.9 4.0 4.0  Chloride 98 - 111 mmol/L 106 111 109  CO2 22 - 32 mmol/L 23 20(L) 20(L)  Calcium 8.9 - 10.3 mg/dL 8.7(L) 8.5(L) 8.6(L)  Total Protein 6.5 - 8.1 g/dL 6.7 - -  Total Bilirubin 0.3 - 1.2 mg/dL 1.2 - -  Alkaline Phos 38 - 126 U/L 45 - -  AST 15 - 41 U/L 18 - -  ALT 0 - 44 U/L 15 - -    DIAGNOSTIC IMAGING:  I have independently reviewed the scans and discussed  with the patient. CT Head Wo Contrast  Result Date: 03/26/2021 CLINICAL DATA:  Syncope/presyncope, cerebrovascular cause suspected EXAM: CT HEAD WITHOUT CONTRAST TECHNIQUE: Contiguous axial images were obtained from the base of the skull through the vertex without intravenous contrast. RADIATION DOSE REDUCTION: This exam was performed  according to the departmental dose-optimization program which includes automated exposure control, adjustment of the mA and/or kV according to patient size and/or use of iterative reconstruction technique. COMPARISON:  02/19/2021. FINDINGS: Brain: No evidence of acute infarction, hemorrhage, hydrocephalus, extra-axial collection or mass lesion/mass effect. Similar patchy white matter hypoattenuation, nonspecific but bowed chronic microvascular ischemic disease. Similar atrophy. Vascular: No hyperdense vessel identified. Calcific intracranial atherosclerosis. Skull: No acute fracture. Sinuses/Orbits: Clear visualized sinuses.  Unremarkable orbits. Other: No mastoid effusions. IMPRESSION: 1. No evidence of acute intracranial abnormality. 2. Similar chronic microvascular ischemic disease and cerebral atrophy (ICD10-G31.9). Electronically Signed   By: Margaretha Sheffield M.D.   On: 03/26/2021 15:46   DG Chest Port 1 View  Result Date: 03/26/2021 CLINICAL DATA:  Syncope and weakness. EXAM: PORTABLE CHEST 1 VIEW COMPARISON:  03/06/2021 and CT chest 02/03/2021. FINDINGS: Trachea is midline. Heart size stable. Pacemaker lead tips are in the right atrium and right ventricle. Thoracic aorta is calcified. Minimal bibasilar linear scarring. No airspace consolidation or pleural fluid. IMPRESSION: No acute findings. Electronically Signed   By: Lorin Picket M.D.   On: 03/26/2021 11:23     ASSESSMENT:  1.  Metastatic prostate cancer to the right adrenal gland: -Presentation to the ER with worsening right-sided back pain, which has been on and off for the last couple of years. - Some decrease in appetite with 6 pound weight loss in the last 6 months.  No hematuria or other B symptoms. - CT renal study on 05/03/2020 showed new right adrenal mass with bilateral renal lesions.  Indeterminate L2 vertebral bodies sclerotic lesion.  Low-attenuation lesions in the kidneys measure up to 2.3 cm on the right and were similar to scan  from July 2021. - CT CAP on 06/12/2020 did not show any evidence of nodal metastasis or metastatic disease in the chest.  Right adrenal mass measures 4.2 x 3.8 cm exerts mass-effect upon the posterior and right lateral wall of the IVC.  Right kidney masses measuring 2.3 x 2.3 cm and 1.9 x 1.7 cm suspicious for RCC.  Upper pole of the left kidney lesion 2.2 x 2.0 cm and medial cortex of the inferior pole measuring 2 x 2 cm and another interpolar left kidney lesion measuring 1.3 x 1.5 cm. - Bone scan on 06/13/2020 was negative. - Right adrenal biopsy consistent with prostatic adenocarcinoma - Degarelix on 07/16/2020, transition to Eligard 45 mg on 08/21/2020. - Abiraterone 100 mg daily/prednisone 5 mg daily started around 01/22/2021.   2.  Social/family history: - He lives at home with his daughter.  He walks with a cane. - He mows lawn with a riding mower.  He has a garden and gross vegetables.  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  Quit smoking 25 years ago. - No family history of malignancies that he is aware of.   PLAN:  1.  Metastatic prostate cancer to the right adrenal gland: - He was hospitalized from 03/26/2021 through 03/28/2021 for syncopal episode.  I have reviewed hospitalization records. - He is currently taking Abiraterone 500 mg daily along with prednisone 5 mg daily. - He has been very active last week and was even driving per daughter. - He reportedly  had urinary symptoms and was diagnosed with UTI yesterday. - We reviewed his labs from 04/14/2021 which showed normal LFTs.  Mild CKD is stable with creatinine 1.34.  CBC was grossly normal with mild anemia. - PSA increased to 3.98 from 2.92 on 03/18/2021.  This was 2.45 on 01/15/2021. - As he had UTI, and is currently started on antibiotics yesterday, we have decided to not to change the dose of Abiraterone at this time.  He will continue Abiraterone for 100 mg daily with prednisone 5 mg daily. - Overall he looks much better than  previous visit. - RTC 6 weeks for follow-up with repeat PSA and routine labs.  If the PSA continues to go high, options include increasing the dose of Abiraterone versus switching to apalutamide.     2.  Right sided back pain: - He is requiring 1 tablet of tramadol 50 mg in the mornings which is controlling pain.  3.  Urinary urgency: - Continue Rapaflo.  He was evaluated by Dr. Alyson Ingles and was started on antibiotic yesterday for UTI.  4.  Decreased appetite: - He has gained 7 pounds since last visit. - Continue Megace twice daily.  He is eating well.   Orders placed this encounter:  No orders of the defined types were placed in this encounter.    Derek Jack, MD [place] [phone]   I, Thana Ates, am acting as a scribe for Dr. Derek Jack.  I, Derek Jack MD, have reviewed the above documentation for accuracy and completeness, and I agree with the above.

## 2021-04-19 LAB — URINE CULTURE

## 2021-04-21 ENCOUNTER — Telehealth: Payer: Self-pay

## 2021-04-21 NOTE — Telephone Encounter (Signed)
-----   Message from Reynaldo Minium, [place] sent at 04/21/2021  3:35 PM EST ----- ?Urince cx indicates Bactrim will cover. Please let pt know ?----- Message ----- ?From: Dorisann Frames, RN ?Sent: 04/21/2021   9:05 AM EST ?To: Berneice Heinrich [place], PA-C ? ?Patient given Bactrim on 03/01 ? ?

## 2021-04-21 NOTE — Telephone Encounter (Signed)
Daughter called made aware. ?

## 2021-04-22 ENCOUNTER — Ambulatory Visit (INDEPENDENT_AMBULATORY_CARE_PROVIDER_SITE_OTHER): Payer: No Typology Code available for payment source | Admitting: Physician Assistant

## 2021-04-22 ENCOUNTER — Other Ambulatory Visit: Payer: Self-pay

## 2021-04-22 VITALS — BP 215/94 | HR 76 | Ht 73.0 in | Wt 184.0 lb

## 2021-04-22 DIAGNOSIS — C61 Malignant neoplasm of prostate: Secondary | ICD-10-CM | POA: Diagnosis not present

## 2021-04-22 DIAGNOSIS — N39498 Other specified urinary incontinence: Secondary | ICD-10-CM | POA: Diagnosis not present

## 2021-04-22 DIAGNOSIS — R350 Frequency of micturition: Secondary | ICD-10-CM

## 2021-04-22 LAB — URINALYSIS, ROUTINE W REFLEX MICROSCOPIC
Bilirubin, UA: NEGATIVE
Glucose, UA: NEGATIVE
Ketones, UA: NEGATIVE
Leukocytes,UA: NEGATIVE
Nitrite, UA: NEGATIVE
Specific Gravity, UA: 1.015 (ref 1.005–1.030)
Urobilinogen, Ur: 0.2 mg/dL (ref 0.2–1.0)
pH, UA: 6 (ref 5.0–7.5)

## 2021-04-22 LAB — MICROSCOPIC EXAMINATION
Bacteria, UA: NONE SEEN
Epithelial Cells (non renal): NONE SEEN /hpf (ref 0–10)
Renal Epithel, UA: NONE SEEN /hpf
WBC, UA: NONE SEEN /hpf (ref 0–5)

## 2021-04-22 LAB — BLADDER SCAN AMB NON-IMAGING: Scan Result: 271

## 2021-04-22 MED ORDER — GEMTESA 75 MG PO TABS: 75.0000 mg | ORAL_TABLET | Freq: Every day | ORAL | 0 refills | Status: DC

## 2021-04-22 NOTE — Progress Notes (Signed)
Assessment: 1. Other urinary incontinence   2. Urine frequency   3. Prostate cancer [facility])     Plan: Discussed treatment options at length with the patient and his daughter.  Will hold silodosin at this time and samples of Gemtesa 75 mg daily for 2 weeks given.  Side effect potential discussed at length including urinary retention.  Discussed possible need for cystoscopic exam and potentially urodynamics for further evaluation.  Patient and his daughter are in agreement.  He will follow-up next week with Dr. Alyson Ingles for recheck and possible cystoscopic exam.  The patient is aware of his elevated blood pressure reading today and will take his BP meds as soon as he gets home.  If he develops symptoms of infection after completion of Bactrim, he will come in for urinalysis as discussed.  Chief Complaint: Urinary frequency and incontinence  HPI: Derrick Long is a 88 y.o. male with history of seed placement for prostate CA and colectomy for colon CA who presents for continued evaluation of ongoing urinary incontinence which he and his daughter feel has worsened since beginning silodosin 1 week ago. Urine culture obtained on 04/16/2021 = 100,000 colonies of E. coli, pansensitive. Patient was treated with a 7-day course of Bactrim DS currently denies dysuria, burning, abdominal pain, fever, chills, gross hematuria.  He continues to wear pads during the night and goes through 3-4 pads during the day.  He feels his leakage is constant and he continues to have ongoing urinary frequency.  He describes a weakness of stream and often has to strain. patient's history significant for having tried tamsulosin which was discontinued at onset of silodosin.  The patient and his daughter convey that neither have been helpful.  No other medications in the recent past except for decreased and Lasix dosage to every other day.  This change occurred approximately 2 weeks ago.  He has not had cystoscopy nor urodynamics in  the past. No h/o rad therapy for colon CA. Of note, patient's systolic blood pressure noted to be quite elevated today.  His daughter reports he has not yet had his blood pressure medications. UA today is clear with 1+ protein IPSS score = 24, quality-of-life = 5 PVR=271  04/16/21 Derrick Long is a 88yo here for evaluation and urinary frequency and UTI. His first UTI was 3 weeks ago and he was admitted to [facility] from 2/8-2/10. Urine culture grew ecoli and patient was treated with duracef. IPSS 23 QOL 5 on flomax 0.'8mg'$  daily. He has a weak urinary stream. Nocturia 5-6x. He has straining to urinate. No gross hematuria or dysuria. UA today is concerning for infection. PVR 136.   Portions of the above documentation were copied from a prior visit for review purposes only.  Allergies: No Known Allergies  PMH: Past Medical History:  Diagnosis Date   Acid reflux    Anemia    Colon cancer ([place])    per patient, colon cancer more than 10 years ago s/p right hemicolectomy   Ex-cigarette smoker    Gout    Hyperlipemia    Hypertension    Paroxysmal atrial fibrillation ([place]) 2014   Prostate cancer ([place]) 08/08/2013   SEEDS 20 YEARS AGO   Prostate disease     PSH: Past Surgical History:  Procedure Laterality Date   BIOPSY  02/05/2021   Procedure: BIOPSY;  Surgeon: Harvel Quale, MD;  [facility];  Service: Gastroenterology;;   COLON SURGERY     Right hemicolectomy in  the setting of colon cancer, per patient   ESOPHAGOGASTRODUODENOSCOPY (EGD) WITH PROPOFOL N/A 02/05/2021   Procedure: ESOPHAGOGASTRODUODENOSCOPY (EGD) WITH PROPOFOL;  Surgeon: Harvel Quale, MD;  [facility];  Service: Gastroenterology;  Laterality: N/A;   LEFT HEART CATHETERIZATION WITH CORONARY ANGIOGRAM N/A 07/21/2012   Procedure: LEFT HEART CATHETERIZATION WITH CORONARY ANGIOGRAM;  Surgeon: Troy Sine, MD;  Location: [facility] CATH LAB;  Service: Cardiovascular;  Laterality: N/A;    PACEMAKER INSERTION     PERMANENT PACEMAKER INSERTION N/A 07/22/2012   Procedure: PERMANENT PACEMAKER INSERTION;  Surgeon: Sanda Klein, MD;  Location: [place] CATH LAB;  Service: Cardiovascular;  Laterality: N/A;   PROSTATE SURGERY      SH: Social History   Tobacco Use   Smoking status: Former    Types: Cigarettes    Quit date: 07/20/2002    Years since quitting: 18.7   Smokeless tobacco: Never  Vaping Use   Vaping Use: Never used  Substance Use Topics   Alcohol use: No   Drug use: No    ROS: Constitutional:  Negative for fever, chills, weight loss CV: Negative for chest pain, previous MI, hypertension Respiratory:  Negative for shortness of breath, wheezing, sleep apnea, frequent cough GI:  Negative for nausea, vomiting, bloody stool, GERD  PE: BP (!) 215/94    Pulse 76    Ht '6\' 1"'$  (1.854 m)    Wt 184 lb (83.5 kg)    BMI 24.28 kg/m  GENERAL APPEARANCE:  Well appearing, well developed, thin, NAD HEENT:  Atraumatic, normocephalic NECK:  Supple. Trachea midline ABDOMEN:  Soft, non-tender, no masses EXTREMITIES:  Moves all extremities well, without clubbing, cyanosis, or edema NEUROLOGIC:  Alert and oriented x 3, slow gait with cane, CN II-XII grossly intact MENTAL STATUS:  appropriate BACK:  No CVAT SKIN:  Warm, dry, and intact   Results: Laboratory Data: Lab Results  Component Value Date   WBC 5.9 04/14/2021   HGB 11.3 (L) 04/14/2021   HCT 32.8 (L) 04/14/2021   MCV 104.1 (H) 04/14/2021   PLT 199 04/14/2021    Lab Results  Component Value Date   CREATININE 1.34 (H) 04/14/2021    Lab Results  Component Value Date   PSA Refused 12/22/2007   PSA 0.07 (L) 06/24/2007   PSA 0.10 05/25/2006    No results found for: TESTOSTERONE  Lab Results  Component Value Date   HGBA1C 5.6 07/19/2012    Urinalysis    Component Value Date/Time   COLORURINE YELLOW 03/26/2021 1101   APPEARANCEUR Clear 04/16/2021 0941   LABSPEC 1.010 03/26/2021 1101   PHURINE 5.0  03/26/2021 1101   GLUCOSEU Negative 04/16/2021 0941   HGBUR MODERATE (A) 03/26/2021 1101   HGBUR negative 06/24/2007 0833   BILIRUBINUR Negative 04/16/2021 0941   KETONESUR NEGATIVE 03/26/2021 1101   PROTEINUR 2+ (A) 04/16/2021 0941   PROTEINUR 30 (A) 03/26/2021 1101   UROBILINOGEN 1.0 07/29/2013 2032   NITRITE Negative 04/16/2021 0941   NITRITE NEGATIVE 03/26/2021 1101   LEUKOCYTESUR 3+ (A) 04/16/2021 0941   LEUKOCYTESUR LARGE (A) 03/26/2021 1101    Lab Results  Component Value Date   LABMICR See below: 04/16/2021   WBCUA >30 (A) 04/16/2021   LABEPIT 0-10 04/16/2021   MUCUS Present 04/16/2021   BACTERIA [place] (A) 04/16/2021    Pertinent Imaging: Results for orders placed in visit on 07/29/01  DG Abd 1 View  Narrative FINDINGS CLINICAL DATA:  PROSTATE CA. SINGLE VIEW ABDOMEN A NORMAL BOWEL GAS PATTERN IS DEMONSTRATED.  MILD TO MODERATE SPUR FORMATION IS DEMONSTRATED AT MULTIPLE LEVELS OF THE LUMBAR SPINE AS WELL AS MINIMAL LEVOCONVEX LUMBAR ROTARY SCOLIOSIS.  ALSO NOTED IS BRIDGING OSSIFICATION OR CALCIFICATION BETWEEN THE LOWER LUMBAR SPINE AND LEFT ILIAC WING. A TRANSITIONAL LUMBOSACRAL VERTEBRA IS ALSO DEMONSTRATED.  MILD ATHEROMATOUS ARTERIAL CALCIFICATIONS ARE NOTED. IMPRESSION NO ACUTE ABNORMALITY AND NO EVIDENCE OF BONY METASTATIC DISEASE.  No results found for this or any previous visit.  No results found for this or any previous visit.  No results found for this or any previous visit.  Results for orders placed during the hospital encounter of 08/01/13  US Renal  Narrative CLINICAL DATA:  Acute urinary retention  EXAM: RENAL/URINARY TRACT ULTRASOUND COMPLETE  COMPARISON:  04/14/2004  FINDINGS: Right Kidney:  Length: 10.2 cm. Complex 2.0 x 1.8 x 1.6 cm echogenic lesion, right kidney lower pole. We were not able the demonstrate definite blood flow within this lesion. Hypoechoic but minimally complex 2.6 x 2.2 x 1.9 cm lesion, right kidney lower  pole.  Left Kidney:  Length: 1.3 cm. Mild prominence of renal sinus adipose tissue. Tiny probable cyst, left mid-upper kidney, 4 mm. Equivocal small mid kidney nonobstructive calculus.  Bladder:  Nondistended.  Catheter in urinary bladder.  IMPRESSION: 1. Complex 2 cm echogenic lesion, right kidney lower pole, in vicinity of prior enhancing nodule. In 2006 this lesion measured 1.4 cm. This may represent slow growth of a small renal cell carcinoma. 2. Minimally complex 2.6 cm right kidney lower pole lesion, most likely a complex cyst although technically nonspecific. 3. Equivocal small left mid kidney nonobstructive calculus. Mild renal sinus adipose tissue prominence on the left.   Electronically Signed By: Sherryl Barters M.D. On: 08/01/2013 15:54  No results found for this or any previous visit.  No results found for this or any previous visit.  Results for orders placed during the hospital encounter of 02/19/21  CT Renal Stone Study  Narrative CLINICAL DATA:  Urinary retention.  History of prostate cancer.  EXAM: CT ABDOMEN AND PELVIS WITHOUT CONTRAST  TECHNIQUE: Multidetector CT imaging of the abdomen and pelvis was performed following the standard protocol without IV contrast.  COMPARISON:  02/03/2021.  FINDINGS: Lower chest: The heart is normal in size and scattered coronary artery calcifications are noted. Pacemaker leads are present in the heart. A 4 mm nodule is present in the right lower lobe, axial image 8.  Hepatobiliary: The liver is within normal limits. Mild intrahepatic and extrahepatic biliary ductal dilatation is seen which is likely due to patient's post cholecystectomy status.  Pancreas: Unremarkable. No pancreatic ductal dilatation or surrounding inflammatory changes.  Spleen: Normal in size without focal abnormality.  Adrenals/Urinary Tract: There is redemonstration of a right adrenal mass measuring 3.2 cm, unchanged. The left  adrenal gland is within normal limits. Scattered hypo and hyperdense structures are present in the kidneys bilaterally. Complex masslike lesions are noted in the kidneys bilaterally measuring 2.6 cm in the mid left kidney and 2.6 cm in the mid right kidney. No renal or ureteral calculus or obstructive uropathy bilaterally. The bladder is unremarkable.  Stomach/Bowel: The stomach is nondistended. No bowel obstruction, free air or pneumatosis. A few scattered diverticular noted along the colon without evidence of diverticulitis. Right hemicolectomy changes are noted.  Vascular/Lymphatic: Aortic atherosclerosis with ectasia of the distal abdominal aorta. 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 The common iliac artery on the left measures 1.8 cm. No abdominal or pelvic  lymphadenopathy.  Reproductive: The prostate gland is enlarged and contains multiple radiopaque brachytherapy seeds.  Other: No free fluid. A small fat containing umbilical hernia is noted. There is a small right inguinal fat containing hernia.  Musculoskeletal: Degenerative changes are present in the thoracolumbar spine and bilateral hips. A stable sclerotic lesion is noted in the L2 vertebral body on the right, possible bone island.  IMPRESSION: 1. No renal or ureteral calculus or obstructive uropathy bilaterally. 2. Multiple hyperdense and hypodense lesions in the kidneys bilaterally. Complex masses are noted in the mid kidneys bilaterally, compatible with history of multifocal renal cell carcinoma and slightly increased in size from the prior exam. 3. Enlarged prostate gland containing brachytherapy seeds. 4. Right lower lobe pulmonary nodule. No follow-up needed if patient is low-risk. Non-contrast chest CT can be considered in 12 months if patient is high-risk. This recommendation follows the consensus statement: Guidelines for Management of Incidental Pulmonary  Nodules Detected on CT Images: From the Fleischner Society 2017; Radiology 2017; 284:228-243. 5. Aortic atherosclerosis. 6. Coronary artery calcifications. 7. Remaining chronic findings as described above.   Electronically Signed By: Brett Fairy M.D. On: 02/20/2021 00:28  No results found for this or any previous visit (from the past 24 hour(s)).

## 2021-04-22 NOTE — Progress Notes (Signed)
post void residual =254m ?

## 2021-04-28 ENCOUNTER — Other Ambulatory Visit: Payer: Self-pay

## 2021-04-28 ENCOUNTER — Ambulatory Visit (INDEPENDENT_AMBULATORY_CARE_PROVIDER_SITE_OTHER): Payer: Medicare Other | Admitting: Urology

## 2021-04-28 ENCOUNTER — Encounter: Payer: Self-pay | Admitting: Urology

## 2021-04-28 VITALS — BP 130/79 | HR 70

## 2021-04-28 DIAGNOSIS — R350 Frequency of micturition: Secondary | ICD-10-CM | POA: Diagnosis not present

## 2021-04-28 DIAGNOSIS — R339 Retention of urine, unspecified: Secondary | ICD-10-CM | POA: Diagnosis not present

## 2021-04-28 LAB — URINALYSIS, ROUTINE W REFLEX MICROSCOPIC
Bilirubin, UA: NEGATIVE
Glucose, UA: NEGATIVE
Ketones, UA: NEGATIVE
Leukocytes,UA: NEGATIVE
Nitrite, UA: NEGATIVE
RBC, UA: NEGATIVE
Specific Gravity, UA: 1.015 (ref 1.005–1.030)
Urobilinogen, Ur: 0.2 mg/dL (ref 0.2–1.0)
pH, UA: 5.5 (ref 5.0–7.5)

## 2021-04-28 MED ORDER — CIPROFLOXACIN HCL 500 MG PO TABS
500.0000 mg | ORAL_TABLET | Freq: Once | ORAL | Status: AC
  Administered 2021-04-28: 500 mg via ORAL

## 2021-04-28 NOTE — Progress Notes (Incomplete)
° °  04/28/21  CC: No chief complaint on file.   HPI:  Blood pressure 130/79, pulse 70. NED. A&Ox3.   No respiratory distress   Abd soft, NT, ND Normal phallus with bilateral descended testicles  Cystoscopy Procedure Note  Patient identification was confirmed, informed consent was obtained, and patient was prepped using Betadine solution.  Lidocaine jelly was administered per urethral meatus.     Pre-Procedure: - Inspection reveals a normal caliber ureteral meatus.  Procedure: The flexible cystoscope was introduced without difficulty - No urethral strictures/lesions are present. - {Blank multiple:19197::"Enlarged","Surgically absent","Normal"} prostate *** - {Blank multiple:19197::"Normal","Elevated","Tight"} bladder neck - Bilateral ureteral orifices identified - Bladder mucosa  reveals no ulcers, tumors, or lesions - No bladder stones - No trabeculation  Retroflexion shows ***   Post-Procedure: - Patient tolerated the procedure well  Assessment/ Plan:   No follow-ups on file.  Nicolette Bang, MD

## 2021-05-05 ENCOUNTER — Ambulatory Visit: Payer: No Typology Code available for payment source | Admitting: Physician Assistant

## 2021-05-05 ENCOUNTER — Other Ambulatory Visit (HOSPITAL_COMMUNITY): Payer: Self-pay

## 2021-05-06 NOTE — Patient Instructions (Signed)

## 2021-05-07 ENCOUNTER — Ambulatory Visit (INDEPENDENT_AMBULATORY_CARE_PROVIDER_SITE_OTHER): Payer: Medicare Other | Admitting: Urology

## 2021-05-07 ENCOUNTER — Other Ambulatory Visit: Payer: Self-pay

## 2021-05-07 ENCOUNTER — Telehealth (HOSPITAL_COMMUNITY): Payer: Self-pay | Admitting: *Deleted

## 2021-05-07 VITALS — BP 196/87 | HR 80

## 2021-05-07 DIAGNOSIS — N138 Other obstructive and reflux uropathy: Secondary | ICD-10-CM | POA: Diagnosis not present

## 2021-05-07 DIAGNOSIS — N401 Enlarged prostate with lower urinary tract symptoms: Secondary | ICD-10-CM

## 2021-05-07 DIAGNOSIS — R339 Retention of urine, unspecified: Secondary | ICD-10-CM

## 2021-05-07 MED ORDER — TAMSULOSIN HCL 0.4 MG PO CAPS: 0.4000 mg | ORAL_CAPSULE | Freq: Every day | ORAL | 11 refills | Status: DC

## 2021-05-07 MED ORDER — CLOTRIMAZOLE-BETAMETHASONE 1-0.05 % EX CREA: 1.0000 "application " | TOPICAL_CREAM | Freq: Two times a day (BID) | CUTANEOUS | 0 refills | Status: DC

## 2021-05-07 NOTE — Telephone Encounter (Signed)
Received a call from daughter Gwinda [place] stating that he has a red spot on upper arm, described as flat and is the size of the bottom of a soda can.  Two smaller areas noted on lower arm.  No drainage noted. Has not started on new medications for [place].  Advised to call PCP for advice on this issue.  Verbalized understanding. ?

## 2021-05-07 NOTE — Progress Notes (Signed)
Fill and Pull Catheter Removal ? ?Patient is present today for a catheter removal.  Patient was cleaned and prepped in a sterile fashion 343m of sterile water/ saline was instilled into the bladder when the patient felt the urge to urinate. 17mof water was then drained from the balloon.  A 18FR foley cath was removed from the bladder no complications were noted .  Patient as then given some time to void on their own.  Patient cannot void  Patient opted to go home and return this afternoon for PVR. ? ?Patient returned unable to void with PVR noted at 895 ml ? ?Simple Catheter Placement ? ?Due to urinary retention patient is present today for a foley cath placement.  Patient was cleaned and prepped in a sterile fashion with betadine. A 16 FR foley catheter was inserted, urine return was noted  [id number] was yellow in color.  The balloon was filled with 10cc of sterile water.  A leg bag was attached for drainage. Patient was also given a night bag to take home and was given instruction on how to change from one bag to another.  Patient was given instruction on proper catheter care.  Patient tolerated well, no complications were noted  ? ?Performed by: Alanna Storti LPN ? ?Additional notes/ Follow up: Per MD note ? ?

## 2021-05-12 ENCOUNTER — Encounter: Payer: Self-pay | Admitting: Urology

## 2021-05-12 NOTE — Patient Instructions (Signed)

## 2021-05-12 NOTE — Progress Notes (Signed)
? ?05/07/2021 ?11:43 AM  ? ?Derrick Long ?14-Feb-1927 ?[id number] ? ?Referring provider: [facility] ?[address] ?[place],  [place] [address] ? ?Followup BPH with urinary retention. ? ?HPI: ?Derrick Long is a 88yo here for followup for urinary retention. Voiding trial failed today. He has stopped flomax 0.'4mg'$  since last visit. No other complaints today.  ? ? ?PMH: ?Past Medical History:  ?Diagnosis Date  ? Acid reflux   ? Anemia   ? Colon cancer ([place])   ? per patient, colon cancer more than 10 years ago s/p right hemicolectomy  ? Ex-cigarette smoker   ? Gout   ? Hyperlipemia   ? Hypertension   ? Paroxysmal atrial fibrillation ([place]) 2014  ? Prostate cancer ([place]) 08/08/2013  ? SEEDS 20 YEARS AGO  ? Prostate disease   ? ? ?Surgical History: ?Past Surgical History:  ?Procedure Laterality Date  ? BIOPSY  02/05/2021  ? Procedure: BIOPSY;  Surgeon: Harvel Quale, MD;  [facility];  Service: Gastroenterology;;  ? COLON SURGERY    ? Right hemicolectomy in the setting of colon cancer, per patient  ? ESOPHAGOGASTRODUODENOSCOPY (EGD) WITH PROPOFOL N/A 02/05/2021  ? Procedure: ESOPHAGOGASTRODUODENOSCOPY (EGD) WITH PROPOFOL;  Surgeon: Harvel Quale, MD;  [facility];  Service: Gastroenterology;  Laterality: N/A;  ? LEFT HEART CATHETERIZATION WITH CORONARY ANGIOGRAM N/A 07/21/2012  ? Procedure: LEFT HEART CATHETERIZATION WITH CORONARY ANGIOGRAM;  Surgeon: Troy Sine, MD;  Location: [facility] CATH LAB;  Service: Cardiovascular;  Laterality: N/A;  ? PACEMAKER INSERTION    ? PERMANENT PACEMAKER INSERTION N/A 07/22/2012  ? Procedure: PERMANENT PACEMAKER INSERTION;  Surgeon: Sanda Klein, MD;  Location: [place] CATH LAB;  Service: Cardiovascular;  Laterality: N/A;  ? PROSTATE SURGERY    ? ? ?Home Medications:  ?Allergies as of 05/07/2021   ?No Known Allergies ?  ? ?  ?Medication List  ?  ? ?  ? Accurate as of May 07, 2021 11:59 PM. If you have any questions, ask your nurse or doctor.  ?   ?  ? ?  ? ?abiraterone acetate 250 MG tablet ?Commonly known as: ZYTIGA ?Take 2 tablets (500 mg total) by mouth daily. Take on an empty stomach 1 hour before or 2 hours after a meal ?  ?acetaminophen 650 MG CR tablet ?Commonly known as: TYLENOL ?Take 1,300 mg by mouth every 8 (eight) hours as needed for pain. ?  ?ascorbic acid 250 MG tablet ?Commonly known as: VITAMIN C ?TAKE ONE TABLET BY MOUTH THREE TIMES A DAY WITH MEALS - TAKE WITH IRON TO PROMOTE BETTER ABSORPTION ?  ?atorvastatin 20 MG tablet ?Commonly known as: LIPITOR ?Take 1 tablet (20 mg total) by mouth daily at 6 PM. ?What changed: when to take this ?  ?carvedilol 3.125 MG tablet ?Commonly known as: Coreg ?Take 1 tablet (3.125 mg total) by mouth 2 (two) times daily. ?  ?Cholecalciferol 25 MCG (1000 UT) tablet ?Take 1,000 Units by mouth daily. ?  ?clotrimazole-betamethasone cream ?Commonly known as: Lotrisone ?Apply 1 application. topically 2 (two) times daily. ?Started by: Nicolette Bang, MD ?  ?diclofenac sodium 1 % Gel ?Commonly known as: VOLTAREN ?Apply 2 g topically 4 (four) times daily. ?What changed:  ?when to take this ?reasons to take this ?  ?Ensure ?Take 237 mLs by mouth daily as needed (nutrition). Vanilla or chocolate ?  ?ferrous sulfate 324 MG Tbec ?Take 324 mg by mouth 2 (two) times daily. ?  ?fluticasone 50 MCG/ACT nasal spray ?Commonly  known as: FLONASE ?Place 2 sprays into both nostrils daily. ?What changed:  ?when to take this ?reasons to take this ?  ?furosemide 40 MG tablet ?Commonly known as: LASIX ?TAKE ONE TABLET BY MOUTH ONCE EVERY DAY FOR BLOOD PRESSURE AND FOR FLUID CONTROL ?  ?Gemtesa 75 MG Tabs ?Generic drug: Vibegron ?Take 75 mg by mouth daily. ?  ?megestrol 400 MG/10ML suspension ?Commonly known as: MEGACE ?Take 10 mLs (400 mg total) by mouth 2 (two) times daily. ?  ?pantoprazole 40 MG tablet ?Commonly known as: Protonix ?Take 1 tablet (40 mg total) by mouth daily. ?  ?potassium chloride SA 20 MEQ tablet ?Commonly known  as: KLOR-CON M ?Take 20 mEq by mouth daily. ?  ?predniSONE 5 MG tablet ?Commonly known as: DELTASONE ?Take 1 tablet (5 mg total) by mouth daily with breakfast. ?  ?rivaroxaban 20 MG Tabs tablet ?Commonly known as: XARELTO ?Take 1 tablet (20 mg total) by mouth daily with supper. ?  ?sulfamethoxazole-trimethoprim 400-80 MG tablet ?Commonly known as: Bactrim ?Take 1 tablet by mouth 2 (two) times daily. ?  ?tamsulosin 0.4 MG Caps capsule ?Commonly known as: FLOMAX ?Take 1 capsule (0.4 mg total) by mouth daily after supper. ?Started by: Nicolette Bang, MD ?  ?traMADol 50 MG tablet ?Commonly known as: ULTRAM ?Take 1 tablet (50 mg total) by mouth 2 (two) times daily. ?  ? ?  ? ? ?Allergies: No Known Allergies ? ?Family History: ?Family History  ?Problem Relation Age of Onset  ? Cancer Neg Hx   ? ? ?Social History:  reports that he quit smoking about 18 years ago. He has never used smokeless tobacco. He reports that he does not drink alcohol and does not use drugs. ? ?ROS: ?All other review of systems were reviewed and are negative except what is noted above in HPI ? ?Physical Exam: ?BP (!) 196/87   Pulse 80   ?Constitutional:  Alert and oriented, No acute distress. ?HEENT: [place] AT, moist mucus membranes.  Trachea midline, no masses. ?Cardiovascular: No clubbing, cyanosis, or edema. ?Respiratory: Normal respiratory effort, no increased work of breathing. ?GI: Abdomen is soft, nontender, nondistended, no abdominal masses ?GU: No CVA tenderness.  ?Lymph: No cervical or inguinal lymphadenopathy. ?Skin: No rashes, bruises or suspicious lesions. ?Neurologic: Grossly intact, no focal deficits, moving all 4 extremities. ?Psychiatric: Normal mood and affect. ? ?Laboratory Data: ?Lab Results  ?Component Value Date  ? WBC 5.9 04/14/2021  ? HGB 11.3 (L) 04/14/2021  ? HCT 32.8 (L) 04/14/2021  ? MCV 104.1 (H) 04/14/2021  ? PLT 199 04/14/2021  ? ? ?Lab Results  ?Component Value Date  ? CREATININE 1.34 (H) 04/14/2021  ? ? ?Lab Results   ?Component Value Date  ? PSA Refused 12/22/2007  ? PSA 0.07 (L) 06/24/2007  ? PSA 0.10 05/25/2006  ? ? ?No results found for: TESTOSTERONE ? ?Lab Results  ?Component Value Date  ? HGBA1C 5.6 07/19/2012  ? ? ?Urinalysis ?   ?Component Value Date/Time  ? COLORURINE YELLOW 03/26/2021 1101  ? APPEARANCEUR Clear 04/28/2021 0952  ? LABSPEC 1.010 03/26/2021 1101  ? PHURINE 5.0 03/26/2021 1101  ? GLUCOSEU Negative 04/28/2021 0952  ? HGBUR MODERATE (A) 03/26/2021 1101  ? HGBUR negative 06/24/2007 0833  ? BILIRUBINUR Negative 04/28/2021 0952  ? [place] NEGATIVE 03/26/2021 1101  ? PROTEINUR 1+ (A) 04/28/2021 0952  ? PROTEINUR 30 (A) 03/26/2021 1101  ? UROBILINOGEN 1.0 07/29/2013 2032  ? NITRITE Negative 04/28/2021 0952  ? NITRITE NEGATIVE 03/26/2021 1101  ? LEUKOCYTESUR  Negative 04/28/2021 0952  ? LEUKOCYTESUR LARGE (A) 03/26/2021 1101  ? ? ?Lab Results  ?Component Value Date  ? LABMICR Comment 04/28/2021  ? [place] None seen 04/22/2021  ? LABEPIT None seen 04/22/2021  ? MUCUS Present 04/22/2021  ? BACTERIA None seen 04/22/2021  ? ? ?Pertinent Imaging: ? ?Results for orders placed in visit on 07/29/01 ? ?DG Abd 1 View ? ?Narrative ?FINDINGS ?CLINICAL DATA:  PROSTATE CA. ?SINGLE VIEW ABDOMEN ?A NORMAL BOWEL GAS PATTERN IS DEMONSTRATED.  MILD TO MODERATE SPUR FORMATION IS DEMONSTRATED AT ?MULTIPLE LEVELS OF THE LUMBAR SPINE AS WELL AS MINIMAL LEVOCONVEX LUMBAR ROTARY SCOLIOSIS.  ALSO ?NOTED IS BRIDGING OSSIFICATION OR CALCIFICATION BETWEEN THE LOWER LUMBAR SPINE AND LEFT ILIAC WING. ?A TRANSITIONAL LUMBOSACRAL VERTEBRA IS ALSO DEMONSTRATED.  MILD ATHEROMATOUS ARTERIAL ?CALCIFICATIONS ARE NOTED. ?IMPRESSION ?NO ACUTE ABNORMALITY AND NO EVIDENCE OF BONY METASTATIC DISEASE. ? ?No results found for this or any previous visit. ? ?No results found for this or any previous visit. ? ?No results found for this or any previous visit. ? ?Results for orders placed during the hospital encounter of 08/01/13 ? ?US Renal ? ?Narrative ?CLINICAL  DATA:  Acute urinary retention ? ?EXAM: ?RENAL/URINARY TRACT ULTRASOUND COMPLETE ? ?COMPARISON:  04/14/2004 ? ?FINDINGS: ?Right Kidney: ? ?Length: 10.2 cm. Complex 2.0 x 1.8 x 1.6 cm echogenic lesion, righ

## 2021-05-14 ENCOUNTER — Ambulatory Visit: Payer: No Typology Code available for payment source | Admitting: Physician Assistant

## 2021-05-14 ENCOUNTER — Encounter: Payer: Self-pay | Admitting: Urology

## 2021-05-14 ENCOUNTER — Other Ambulatory Visit: Payer: Self-pay

## 2021-05-14 ENCOUNTER — Ambulatory Visit (INDEPENDENT_AMBULATORY_CARE_PROVIDER_SITE_OTHER): Payer: Medicare Other | Admitting: Urology

## 2021-05-14 VITALS — BP 178/79 | HR 82

## 2021-05-14 DIAGNOSIS — R339 Retention of urine, unspecified: Secondary | ICD-10-CM

## 2021-05-14 DIAGNOSIS — N138 Other obstructive and reflux uropathy: Secondary | ICD-10-CM

## 2021-05-14 DIAGNOSIS — N401 Enlarged prostate with lower urinary tract symptoms: Secondary | ICD-10-CM

## 2021-05-14 NOTE — Progress Notes (Signed)
Fill and Pull Catheter Removal ? ?Patient is present today for a catheter removal.  Patient was cleaned and prepped in a sterile fashion 231m of sterile water/ saline was instilled into the bladder when the patient felt the urge to urinate. 115mof water was then drained from the balloon.  A 16FR foley cath was removed from the bladder no complications were noted .  Patient as then given some time to void on their own.  Patient can void  7572mn their own after some time.  Patient tolerated well. ? ?Performed by: Hazael Olveda LPN ? ?Cysto performed ? ?Simple Catheter Placement ? ?Due to urinary retention patient is present today for a foley cath placement.  Patient was cleaned and prepped in a sterile fashion with betadine. A 16 FR foley catheter was inserted, urine return was noted  400m51mrine was yellow in color.  The balloon was filled with 10cc of sterile water.  A leg bag was attached for drainage. Patient was also given a night bag to take home and was given instruction on how to change from one bag to another.  Patient was given instruction on proper catheter care.  Patient tolerated well, no complications were noted  ? ?Performed by: Meir Elwood LPN ? ?Additional notes/ Follow up: Per MD note  ?

## 2021-05-14 NOTE — Patient Instructions (Addendum)
Dear Derrick Long, ?  ?Thank you for choosing [facility] to assist in your urologic care for your upcoming surgery. The following information below includes specific dates and details related to surgery: ?  ?The Surgical Procedure you are scheduled to have performed is Cystoscopy with urolift ?  ?Surgery Date: 05/19/2021 ?  ?Physician performing the surgery: Nicolette Bang ?  ?Do not eat or drink after midnight the day before your surgery.  ?  ?You will need a driver the day of surgery and will not be able to operate heavy machinery for 24 hours after.  ?  ?Your surgery will be performed at  ?[address] ?[place], [place] [address] ?  ?Enter at the [employer] and check in at the [facility] desk.   ?  ?Pre-Admit Testing Info ?  ?Pre- Admit appointments are interview with an anesthesiologist or a pre-operative anesthesia nurse. These appointments are typically completed as an in person visit but can take place over the telephone.  You will be contacted to confirm the date and time window.  ?  ?If you have any questions or concerns, please don't hesitate to call the office at [phone] ?  ?Thank you,  ?  ?Gibson Ramp, RN ?Clinical Surgery Coordinator ?[facility]  ? ? ? ? ?Foley Catheter Care and Patient Education ? ?Perform catheter care every day.  You can do this while in the shower, but NOT while taking a tub bath. ? ?You will need the following supplies: ?-mild soap, such as Dove ?-water ?-a clean washcloth (not one already used for bathing) or a 4x4 piece of gauze ?-1 Cath-Secure ?-night drainage bag ?-2 alcohol swabs ? ?Was you hands thoroughly with soap and water ?Using mild soap and water, clan your genital area ?Men should retract the foreskin, if needed, and clean the area, including the penis ?Women should separate the labia, and clean the area from front to back ? ?3. Clean your urinary opening, which is where the catheter enters your  body. ?4. Clean the catheter from where it enters your body and then down, away from your body.  Hold the catheter at the point it enters your body so that you don't put tension on it. ?5. Rinse the area well and dry it gently. ? ?Changing the drainage bag ?You will change your drainage bag twice a day ?-in the morning after you shower, change he night bag to the leg bag ?-at night before you go to bed, change the leg bag to the night bag ? ?Wash your hands thoroughly with soap and water ?Empty the urine from the drainage bag into the toilet before you change it ?Pinch off the catheter with your fingers and disconnect the used bag ?Wipe the end of the catheter using an alcohol pad ?Wipe the connector on the bag using the second alcohol pad ?Connect the clean bag to the catheter and release your finger pinch ?Check all connections.  Straighten any kinks or twits in the tubing ? ?Caring for the Leg bag ?-always wear the leg bag below your knee.  This will help it drain. ?-keep the leg bag secure with the velcro straps.  If the straps leave a mark on your leg they are to tight and should be loosened.  Leaving the straps too tight can decrease you circulation and lead to blood clots. ?-empty the leg bag through the spout at the bottom every 2-4 hours as needed.  Do not let the bag become completely full. ?-do not lie down for longer than 2 hours while you are wearing the leg bag. ? ?Caring for the Night Bag ?-always keep the night bag below the level of your bladder . ?-to hang your night bag while you sleep, place a clean plastic bag inside of a wastebasket.  Hang the night bag on the inside of the wastebasket. ? ?Cleaning the Drainage bag ?Wash you hands thoroughly with soap and water. ?Rinse the equipment with cool water.  Do not use hot water it can damage the plastic equipment. ?Was the equipment with a mild liquid detergent (ivory) and rinse with cool water. ?To decrease odor, fill the bag halfway with a mixture  of 1 part vinegar and 3 parts water. Shake the bag and let it sit for 15 minutes. ?Rinse the bag with cool water and hang it up to dry. ? ?Preventing infection ?-keep the drainage bag below the level of your bladder and off the floor at all times. ?-keep the catheter secured to your thigh to prevent it from moving. ?-do not lie on or block the flow of urine in the tubing. ?-shower daily to keep the catheter clean. ?-clean your hands before and after touching the catheter or bag. ?-the spout of the drainage bag should never touch the side of the toilet or any emptying container. ? ?Special Points ?-You may see some blood or urine around where the catheter enters your body, especially when walking or having a bowel movement.  This is normal, as long as there is urine draining into the drainage bag.  If you experience significant leakage around  catheter tube where it enters your urethra possibly associated with lower abdominal cramping you could be having a bladder spasm.  Please notify your doctor and we can prescribe you a medication to improve your symptoms. ?-drink 1-2 glasses of liquids every 2 hours while you're awake. ? ?Call your doctor immediately if ?-your catheter comes out, do not try to replace it yourself ?-you have temperature of 101F (38.8C) or higher ?-you have bright red blood or large blood clots in your urine ?-you have abdominal pain and no urine in your catheter bag ?  ?

## 2021-05-14 NOTE — H&P (View-Only) (Signed)
? ?  05/14/21 ? ?CC: urinary retention ? ?HPI: ?Mr Bonfield is a 88yo here for folowup for urinary retention ?Blood pressure (!) 178/79, pulse 82. ?NED. A&Ox3.   ?No respiratory distress   ?Abd soft, NT, ND ?Normal phallus with bilateral descended testicles ? ?Cystoscopy Procedure Note ? ?Patient identification was confirmed, informed consent was obtained, and patient was prepped using Betadine solution.  Lidocaine jelly was administered per urethral meatus.   ? ? ?Pre-Procedure: ?- Inspection reveals a normal caliber ureteral meatus. ? ?Procedure: ?The flexible cystoscope was introduced without difficulty ?- No urethral strictures/lesions are present. ?- Enlarged prostate no median lobe ?- Normal bladder neck ?- Bilateral ureteral orifices identified ?- Bladder mucosa  reveals no ulcers, tumors, or lesions ?- No bladder stones ?- No trabeculation ? ?Retroflexion shows no intravesical prostatic protrusion ? ? ?Post-Procedure: ?- Patient tolerated the procedure well ? ?Assessment/ Plan: ?We discussed the management of his BPH including continued medical therapy, Rezum, Urolift, TURP and simple prostatectomy. After discussing the options the patient has elected to proceed with Urolift. Risks/benefits/alternatives discussed.  ? ?No follow-ups on file. ? ?Nicolette Bang, MD  ?  ?

## 2021-05-14 NOTE — Progress Notes (Signed)
? ?  05/14/21 ? ?CC: urinary retention ? ?HPI: ?Derrick Long is a 88yo here for folowup for urinary retention ?Blood pressure (!) 178/79, pulse 82. ?NED. A&Ox3.   ?No respiratory distress   ?Abd soft, NT, ND ?Normal phallus with bilateral descended testicles ? ?Cystoscopy Procedure Note ? ?Patient identification was confirmed, informed consent was obtained, and patient was prepped using Betadine solution.  Lidocaine jelly was administered per urethral meatus.   ? ? ?Pre-Procedure: ?- Inspection reveals a normal caliber ureteral meatus. ? ?Procedure: ?The flexible cystoscope was introduced without difficulty ?- No urethral strictures/lesions are present. ?- Enlarged prostate no median lobe ?- Normal bladder neck ?- Bilateral ureteral orifices identified ?- Bladder mucosa  reveals no ulcers, tumors, or lesions ?- No bladder stones ?- No trabeculation ? ?Retroflexion shows no intravesical prostatic protrusion ? ? ?Post-Procedure: ?- Patient tolerated the procedure well ? ?Assessment/ Plan: ?We discussed the management of his BPH including continued medical therapy, Rezum, Urolift, TURP and simple prostatectomy. After discussing the options the patient has elected to proceed with Urolift. Risks/benefits/alternatives discussed.  ? ?No follow-ups on file. ? ?Nicolette Bang, MD  ?  ?

## 2021-05-16 ENCOUNTER — Encounter (HOSPITAL_COMMUNITY)
Admission: RE | Admit: 2021-05-16 | Discharge: 2021-05-16 | Disposition: A | Discharge status: Home / Self Care | Payer: No Typology Code available for payment source | Source: Ambulatory Visit | Attending: Urology | Admitting: Urology

## 2021-05-16 ENCOUNTER — Encounter (HOSPITAL_COMMUNITY): Payer: Self-pay

## 2021-05-16 ENCOUNTER — Other Ambulatory Visit (HOSPITAL_COMMUNITY): Payer: Self-pay

## 2021-05-19 ENCOUNTER — Telehealth: Payer: Self-pay

## 2021-05-19 ENCOUNTER — Ambulatory Visit (HOSPITAL_COMMUNITY): Payer: Medicare Other | Admitting: Anesthesiology

## 2021-05-19 ENCOUNTER — Ambulatory Visit (HOSPITAL_BASED_OUTPATIENT_CLINIC_OR_DEPARTMENT_OTHER): Payer: Medicare Other | Admitting: Anesthesiology

## 2021-05-19 ENCOUNTER — Encounter (HOSPITAL_COMMUNITY): Payer: Self-pay | Admitting: Urology

## 2021-05-19 ENCOUNTER — Ambulatory Visit (HOSPITAL_COMMUNITY)
Admission: RE | Admit: 2021-05-19 | Discharge: 2021-05-19 | Disposition: A | Discharge status: Home / Self Care | Payer: Medicare Other | Attending: Urology | Admitting: Urology

## 2021-05-19 ENCOUNTER — Encounter (HOSPITAL_COMMUNITY)
Admission: RE | Disposition: A | Discharge status: Home / Self Care | Payer: Self-pay | Source: Home / Self Care | Attending: Urology

## 2021-05-19 DIAGNOSIS — N138 Other obstructive and reflux uropathy: Secondary | ICD-10-CM

## 2021-05-19 DIAGNOSIS — I10 Essential (primary) hypertension: Secondary | ICD-10-CM | POA: Diagnosis not present

## 2021-05-19 DIAGNOSIS — N401 Enlarged prostate with lower urinary tract symptoms: Secondary | ICD-10-CM | POA: Insufficient documentation

## 2021-05-19 DIAGNOSIS — K219 Gastro-esophageal reflux disease without esophagitis: Secondary | ICD-10-CM | POA: Diagnosis not present

## 2021-05-19 DIAGNOSIS — Z87891 Personal history of nicotine dependence: Secondary | ICD-10-CM | POA: Insufficient documentation

## 2021-05-19 DIAGNOSIS — Z950 Presence of cardiac pacemaker: Secondary | ICD-10-CM | POA: Diagnosis not present

## 2021-05-19 DIAGNOSIS — N289 Disorder of kidney and ureter, unspecified: Secondary | ICD-10-CM

## 2021-05-19 DIAGNOSIS — N320 Bladder-neck obstruction: Secondary | ICD-10-CM | POA: Diagnosis not present

## 2021-05-19 DIAGNOSIS — R338 Other retention of urine: Secondary | ICD-10-CM | POA: Insufficient documentation

## 2021-05-19 HISTORY — PX: CYSTOSCOPY WITH INSERTION OF UROLIFT: SHX6678

## 2021-05-19 SURGERY — CYSTOSCOPY WITH INSERTION OF UROLIFT
Anesthesia: General | Site: Prostate

## 2021-05-19 MED ORDER — CEFAZOLIN SODIUM-DEXTROSE 2-4 GM/100ML-% IV SOLN
INTRAVENOUS | Status: DC
Start: 2021-05-19 — End: 2021-05-20
  Filled 2021-05-19: qty 100

## 2021-05-19 MED ORDER — TRAMADOL HCL 50 MG PO TABS: 50.0000 mg | ORAL_TABLET | Freq: Two times a day (BID) | ORAL | 0 refills | Status: DC

## 2021-05-19 MED ORDER — PROPOFOL 10 MG/ML IV BOLUS
INTRAVENOUS | Status: AC
  Filled 2021-05-19: qty 20

## 2021-05-19 MED ORDER — CEFAZOLIN SODIUM-DEXTROSE 2-3 GM-%(50ML) IV SOLR
INTRAVENOUS | Status: DC | PRN
  Administered 2021-05-19: 2 g via INTRAVENOUS

## 2021-05-19 MED ORDER — DEXAMETHASONE SODIUM PHOSPHATE 10 MG/ML IJ SOLN
INTRAMUSCULAR | Status: DC | PRN
  Administered 2021-05-19: 10 mg via INTRAVENOUS

## 2021-05-19 MED ORDER — ONDANSETRON HCL 4 MG/2ML IJ SOLN
INTRAMUSCULAR | Status: DC | PRN
  Administered 2021-05-19: 4 mg via INTRAVENOUS

## 2021-05-19 MED ORDER — LACTATED RINGERS IV SOLN: INTRAVENOUS | Status: DC

## 2021-05-19 MED ORDER — CHLORHEXIDINE GLUCONATE 0.12 % MT SOLN
15.0000 mL | Freq: Once | OROMUCOSAL | Status: AC
  Administered 2021-05-19: 15 mL via OROMUCOSAL

## 2021-05-19 MED ORDER — LIDOCAINE 2% (20 MG/ML) 5 ML SYRINGE
INTRAMUSCULAR | Status: DC | PRN
  Administered 2021-05-19: 80 mg via INTRAVENOUS

## 2021-05-19 MED ORDER — FENTANYL CITRATE (PF) 100 MCG/2ML IJ SOLN
INTRAMUSCULAR | Status: DC | PRN
Start: 2021-05-19 — End: 2021-05-19
  Administered 2021-05-19 (×2): 25 ug via INTRAVENOUS

## 2021-05-19 MED ORDER — PROPOFOL 10 MG/ML IV BOLUS
INTRAVENOUS | Status: DC | PRN
  Administered 2021-05-19: 130 mg via INTRAVENOUS

## 2021-05-19 MED ORDER — ONDANSETRON HCL 4 MG/2ML IJ SOLN
INTRAMUSCULAR | Status: AC
  Filled 2021-05-19: qty 2

## 2021-05-19 MED ORDER — ORAL CARE MOUTH RINSE: 15.0000 mL | Freq: Once | OROMUCOSAL | Status: AC

## 2021-05-19 MED ORDER — WATER FOR IRRIGATION, STERILE IR SOLN
Status: DC | PRN
  Administered 2021-05-19: 500 mL
  Administered 2021-05-19: 3000 mL

## 2021-05-19 MED ORDER — FENTANYL CITRATE (PF) 100 MCG/2ML IJ SOLN
INTRAMUSCULAR | Status: AC
  Filled 2021-05-19: qty 2

## 2021-05-19 SURGICAL SUPPLY — 17 items
BAG DRAIN URO TABLE W/ADPT NS (BAG) ×2 IMPLANT
BAG HAMPER (MISCELLANEOUS) ×2 IMPLANT
CLOTH BEACON ORANGE TIMEOUT ST (SAFETY) ×2 IMPLANT
GLOVE SURG POLYISO LF SZ8 (GLOVE) ×2 IMPLANT
GLOVE SURG UNDER POLY LF SZ7 (GLOVE) ×4 IMPLANT
GOWN STRL REUS W/TWL LRG LVL3 (GOWN DISPOSABLE) ×2 IMPLANT
GOWN STRL REUS W/TWL XL LVL3 (GOWN DISPOSABLE) ×2 IMPLANT
KIT TURNOVER CYSTO (KITS) ×2 IMPLANT
MANIFOLD NEPTUNE II (INSTRUMENTS) ×2 IMPLANT
PACK CYSTO (CUSTOM PROCEDURE TRAY) ×2 IMPLANT
PAD ARMBOARD 7.5X6 YLW CONV (MISCELLANEOUS) ×2 IMPLANT
SYSTEM UROLIFT (Male Continence) ×4 IMPLANT
TOWEL OR 17X26 4PK STRL BLUE (TOWEL DISPOSABLE) ×2 IMPLANT
TRAY FOLEY W/BAG SLVR 16FR (SET/KITS/TRAYS/PACK) ×2
TRAY FOLEY W/BAG SLVR 16FR ST (SET/KITS/TRAYS/PACK) ×1 IMPLANT
WATER STERILE IRR 3000ML UROMA (IV SOLUTION) ×2 IMPLANT
WATER STERILE IRR 500ML POUR (IV SOLUTION) ×2 IMPLANT

## 2021-05-19 NOTE — Anesthesia Postprocedure Evaluation (Signed)
Anesthesia Post Note ? ?Patient: OBIE KALLENBACH ? ?Procedure(s) Performed: CYSTOSCOPY WITH INSERTION OF UROLIFT (Prostate) ? ?Patient location during evaluation: Phase II ?Anesthesia Type: General ?Level of consciousness: awake and alert and oriented ?Pain management: pain level controlled ?Vital Signs Assessment: post-procedure vital signs reviewed and stable ?Respiratory status: spontaneous breathing, nonlabored ventilation and respiratory function stable ?Cardiovascular status: blood pressure returned to baseline and stable ?Postop Assessment: no apparent nausea or vomiting ?Anesthetic complications: no ? ? ?No notable events documented. ? ? ?Last Vitals:  ?Vitals:  ? 05/19/21 1515 05/19/21 1548  ?BP: (!) 163/79   ?Pulse: (!) 59 62  ?Resp: 12   ?Temp: (!) 36.4 ?C 36.6 ?C  ?SpO2: 100% 100%  ?  ?Last Pain:  ?Vitals:  ? 05/19/21 1548  ?TempSrc: Oral  ?PainSc: 0-No pain  ? ? ?  ?  ?  ?  ?  ?  ? ?Estel Tonelli C Astraea Gaughran ? ? ? ? ?

## 2021-05-19 NOTE — Anesthesia Preprocedure Evaluation (Addendum)
Anesthesia Evaluation  ?Patient identified by MRN, date of birth, ID band ?Patient awake ? ? ? ?Reviewed: ?Allergy & Precautions, NPO status , Patient's Chart, lab work & pertinent test results ? ?Airway ?Mallampati: II ? ?TM Distance: >3 FB ?Neck ROM: Full ? ? ? Dental ? ?(+) Upper Dentures, Lower Dentures ?  ?Pulmonary ?neg pulmonary ROS, former smoker,  ?  ?Pulmonary exam normal ?breath sounds clear to auscultation ? ? ? ? ? ? Cardiovascular ?Exercise Tolerance: Good ?hypertension, Pt. on medications ?+ dysrhythmias + pacemaker  ?Rhythm:Regular Rate:Normal ?+ Systolic murmurs ??1. LV function is vigorous with near cavity obliteration during systole .  ?Left ventricular ejection fraction, by estimation, is 70 to 75%. The left  ?ventricle has hyperdynamic function. The left ventricle has no regional  ?wall motion abnormalities. There  ?is severe left ventricular hypertrophy. Left ventricular diastolic  ?parameters are consistent with Grade I diastolic dysfunction (impaired  ?relaxation). Elevated left atrial pressure.  ??2. Right ventricular systolic function is normal. The right ventricular  ?size is normal. There is normal pulmonary artery systolic pressure.  ??3. Left atrial size was mild to moderately dilated.  ??4. Lead seen in RA. Right atrial size was mildly dilated.  ??5. Trivial mitral valve regurgitation.  ??6. AV is thickened, calcified with restricted motion Peak and mean  ?gradients through the valve are 19 and 9 mm Hg respectively. Dimensionless  ?index is 0.53 Overall consistent with mild AS. Marland Kitchen The aortic valve is  ?tricuspid. Aortic valve regurgitation is  ?not visualized.  ??7. The inferior vena cava is normal in size with greater than 50%  ?respiratory variability, suggesting right atrial pressure of 3 mmHg.  ?  ?Neuro/Psych ? Headaches, negative psych ROS  ? GI/Hepatic ?Neg liver ROS, GERD  Medicated and Controlled,  ?Endo/Other  ?negative endocrine ROS ?  Renal/GU ?Renal InsufficiencyRenal disease  ?negative genitourinary ?  ?Musculoskeletal ? ?(+) Arthritis ,  ? Abdominal ?  ?Peds ?negative pediatric ROS ?(+)  Hematology ? ?(+) Blood dyscrasia, anemia ,   ?Anesthesia Other Findings ? ? Reproductive/Obstetrics ?negative OB ROS ? ?  ? ? ? ? ? ? ? ? ? ? ? ? ? ?  ?  ? ? ? ? ? ? ?Anesthesia Physical ?Anesthesia Plan ? ?ASA: 3 ? ?Anesthesia Plan: General  ? ?Post-op Pain Management: Minimal or no pain anticipated  ? ?Induction: Intravenous ? ?PONV Risk Score and Plan: 4 or greater and Ondansetron ? ?Airway Management Planned: LMA ? ?Additional Equipment:  ? ?Intra-op Plan:  ? ?Post-operative Plan: Extubation in OR ? ?Informed Consent: I have reviewed the patients History and Physical, chart, labs and discussed the procedure including the risks, benefits and alternatives for the proposed anesthesia with the patient or authorized representative who has indicated his/her understanding and acceptance.  ? ? ? ?Dental advisory given ? ?Plan Discussed with: CRNA and Surgeon ? ?Anesthesia Plan Comments:   ? ? ? ? ? ?Anesthesia Quick Evaluation ? ?

## 2021-05-19 NOTE — Interval H&P Note (Signed)
History and Physical Interval Note: ? ?05/19/2021 ?1:31 PM ? ?Derrick Long  has presented today for surgery, with the diagnosis of BPH with urinary retention.  The various methods of treatment have been discussed with the patient and family. After consideration of risks, benefits and other options for treatment, the patient has consented to  Procedure(s): ?CYSTOSCOPY WITH INSERTION OF UROLIFT (N/A) as a surgical intervention.  The patient's history has been reviewed, patient examined, no change in status, stable for surgery.  I have reviewed the patient's chart and labs.  Questions were answered to the patient's satisfaction.   ? ? ?Nicolette Bang ? ? ?

## 2021-05-19 NOTE — Anesthesia Procedure Notes (Signed)
Procedure Name: LMA Insertion ?Date/Time: 05/19/2021 2:52 PM ?Performed by: Myna Bright, CRNA ?Pre-anesthesia Checklist: Patient identified, Emergency Drugs available, Suction available and Patient being monitored ?Patient Re-evaluated:Patient Re-evaluated prior to induction ?Oxygen Delivery Method: Circle system utilized ?Preoxygenation: Pre-oxygenation with 100% oxygen ?Induction Type: IV induction ?Ventilation: Mask ventilation without difficulty ?LMA: LMA inserted ?LMA Size: 5.0 ?Number of attempts: 1 ?Placement Confirmation: positive ETCO2 and breath sounds checked- equal and bilateral ?Tube secured with: Tape ?Dental Injury: Teeth and Oropharynx as per pre-operative assessment  ? ? ? ? ?

## 2021-05-19 NOTE — Op Note (Signed)
? ?  PREOPERATIVE DIAGNOSIS:  Benign prostatic hypertrophy with bladder ?outlet obstruction. ? ?POSTOPERATIVE DIAGNOSIS:  Benign prostatic hypertrophy with bladder ?outlet obstruction. ? ?PROCEDURE:  Cystoscopy with implantation of UroLift devices, 4 implants. ? ?SURGEON:  Nicolette Bang, M.D. ? ?ANESTHESIA:  General ? ?ANTIBIOTICS: ancef ? ?SPECIMEN:  None. ? ?DRAINS:  A 16-French Foley catheter. ? ?BLOOD LOSS:  Minimal. ? ?COMPLICATIONS:  None. ? ?INDICATIONS: The Patient is an 88 year old male with BPH and ?bladder outlet obstruction.  He has failed medical therapy and has ?elected UroLift for definitive treatment. ? ?FINDINGS OF PROCEDURE:  He was taken to the operating room where a ?genral anesthetic was induced.  He was placed in ?lithotomy position and was fitted with PAS hose.  His perineum and ?genitalia were prepped with chlorhexidine, and he was draped in usual ?sterile fashion. ? ?Cystoscopy was performed using the UroLift scope and 0 degree lens. ?Examination revealed a normal urethra.  The external sphincter was ?intact.  Prostatic urethra was approximately 4 cm in length with lateral ?lobe enlargement. There was also little bit of bladder neck elevation. ?Inspection of bladder revealed mild-to-moderate trabeculation with no ?tumors, stones, or inflammation.  No cellules or diverticula were noted. ?Ureteral orifices were in their normal anatomic position effluxing clear ?urine. ? ?After initial cystoscopy, the visual obturator was replaced with the ?first UroLift device.  This was turned to the 9 o'clock position and ?pulled back to the veru and then slightly advanced.  Pressure was ?then applied to the right lateral lobe and the UroLift device was ?deployed. ? ?The second UroLift device was then inserted and applied to the left ?lateral lobe at 3 o'clock and deployed in the mid prostatic urethra. ?After this, there was still some apparent obstruction closer to the ?bladder neck.  So a second level of  UroLift your left device was applied ?between the mid urethra and the proximal urethra providing further ?patency to the prostatic urethra.  At this point, a Foley catheter was indicated.  The scope was removed and ?a 16-French Foley catheter was inserted without difficulty. The balloon ?was filled with 10 mL sterile fluid, and the catheter was placed to ?straight drainage. ? ?COMPLICATIONS: None  ? ?CONDITION: Stable, extubated, transferred to PACU ? ?PLAN: The patient will be discharged home and followup in 2 days for a voiding trial.   ?

## 2021-05-19 NOTE — Telephone Encounter (Signed)
I called and spoke with daughter, Gwinda Maine, today about patient medication hold prior to surgery. ? ?Per daughter, [place] contacted her last Thursday and informed patient to hold Xarelto as stated from our office 3 days prior to patient upcoming surgery. ? ?[employer] - pending authorization. ?

## 2021-05-19 NOTE — Transfer of Care (Signed)
Immediate Anesthesia Transfer of Care Note ? ?Patient: Derrick Long ? ?Procedure(s) Performed: CYSTOSCOPY WITH INSERTION OF UROLIFT (Prostate) ? ?Patient Location: PACU ? ?Anesthesia Type:General ? ?Level of Consciousness: sedated, patient cooperative and responds to stimulation ? ?Airway & Oxygen Therapy: Patient Spontanous Breathing and Patient connected to nasal cannula oxygen ? ?Post-op Assessment: Report given to RN, Post -op Vital signs reviewed and stable and Patient moving all extremities ? ?Post vital signs: Reviewed and stable ? ?Last Vitals:  ?Vitals Value Taken Time  ?BP 163/79 05/19/21 1516  ?Temp    ?Pulse 60 05/19/21 1520  ?Resp 17 05/19/21 1520  ?SpO2 100 % 05/19/21 1520  ?Vitals shown include unvalidated device data. ? ?Last Pain:  ?Vitals:  ? 05/19/21 1239  ?TempSrc: Oral  ?PainSc: 0-No pain  ?   ? ?  ? ?Complications: No notable events documented. ?

## 2021-05-21 ENCOUNTER — Encounter (HOSPITAL_COMMUNITY): Payer: Self-pay | Admitting: Urology

## 2021-05-21 ENCOUNTER — Ambulatory Visit (INDEPENDENT_AMBULATORY_CARE_PROVIDER_SITE_OTHER): Payer: Medicare Other | Admitting: Urology

## 2021-05-21 VITALS — BP 166/85 | HR 72

## 2021-05-21 DIAGNOSIS — R339 Retention of urine, unspecified: Secondary | ICD-10-CM

## 2021-05-21 MED ORDER — CLOTRIMAZOLE-BETAMETHASONE 1-0.05 % EX CREA: 1.0000 "application " | TOPICAL_CREAM | Freq: Two times a day (BID) | CUTANEOUS | 0 refills | Status: DC

## 2021-05-21 NOTE — Progress Notes (Signed)
Fill and Pull Catheter Removal ? ?Patient is present today for a catheter removal.  Patient was cleaned and prepped in a sterile fashion 222m of sterile water/ saline was instilled into the bladder when the patient felt the urge to urinate. 158mof water was then drained from the balloon.  A 16FR foley cath was removed from the bladder no complications were noted .  Patient as then given some time to void on their own.  Patient cannot void  60m43mn their own after some time.  Patient tolerated well. ? ?Performed by: AmaEstill Bamberg ? ?Follow up/ Additional notes: MD to see.  ?

## 2021-05-21 NOTE — Progress Notes (Signed)
Patient returned this afternoon for PVR- ?PVR 265 ? ?Patient reports he has no abdominal pain and reports he has been voiding "some" at home.  ? ? ?Discussed with Dr. Alyson Ingles, okay for patient to leave today and return in am for PVR> daughter and patient agrees with plan and will return in am.  ?

## 2021-05-22 ENCOUNTER — Ambulatory Visit (INDEPENDENT_AMBULATORY_CARE_PROVIDER_SITE_OTHER): Payer: Medicare Other | Admitting: Physician Assistant

## 2021-05-22 DIAGNOSIS — R339 Retention of urine, unspecified: Secondary | ICD-10-CM

## 2021-05-22 LAB — URINALYSIS, ROUTINE W REFLEX MICROSCOPIC
Bilirubin, UA: NEGATIVE
Glucose, UA: NEGATIVE
Ketones, UA: NEGATIVE
Nitrite, UA: NEGATIVE
Specific Gravity, UA: 1.02 (ref 1.005–1.030)
Urobilinogen, Ur: 1 mg/dL (ref 0.2–1.0)
pH, UA: 6 (ref 5.0–7.5)

## 2021-05-22 LAB — MICROSCOPIC EXAMINATION
Bacteria, UA: NONE SEEN
Epithelial Cells (non renal): NONE SEEN /hpf (ref 0–10)
RBC, Urine: >30 /hpf — AB (ref 0–2)
Renal Epithel, UA: NONE SEEN /hpf

## 2021-05-22 LAB — BLADDER SCAN AMB NON-IMAGING: Scan Result: 384

## 2021-05-22 MED ORDER — DOXYCYCLINE HYCLATE 100 MG PO CAPS: 100.0000 mg | ORAL_CAPSULE | Freq: Two times a day (BID) | ORAL | 0 refills | Status: DC

## 2021-05-22 NOTE — Progress Notes (Signed)
Patient here today for PVR - 384 ?Complaints of dysuria at times when voiding- Dr. Alyson Ingles paged- spoke with Dr. Alyson Ingles, UA and Culture ordered based of patient complaints. Patient reports he is voiding some on his own at home as well.  ?Per Dr. Alyson Ingles Doxycyline '100mg'$  BID for 7 days sent pending culture. ? ?Patient will return this afternoon if his voiding discomfort increases.  ? ?Daughter voiced understanding when discussed over phone today.  ? ? ? ?

## 2021-05-22 NOTE — Progress Notes (Signed)
post void residual =370m ?

## 2021-05-23 ENCOUNTER — Ambulatory Visit (INDEPENDENT_AMBULATORY_CARE_PROVIDER_SITE_OTHER): Payer: Medicare Other | Admitting: Urology

## 2021-05-23 ENCOUNTER — Encounter: Payer: Self-pay | Admitting: Urology

## 2021-05-23 DIAGNOSIS — R339 Retention of urine, unspecified: Secondary | ICD-10-CM

## 2021-05-23 LAB — BLADDER SCAN AMB NON-IMAGING: Scan Result: 376

## 2021-05-23 NOTE — Patient Instructions (Signed)

## 2021-05-23 NOTE — Progress Notes (Signed)
PVR today 376 ?

## 2021-05-23 NOTE — Progress Notes (Signed)
? ?05/23/2021 ?9:30 AM  ? ?Derrick Long ?11-25-1926 ?[id number] ? ?Referring provider: [facility], [facility] ?[address] ?[place],  [place] [address] ? ?Urinary retention ? ? ?HPI: ?Derrick Long is a 88yo here for followup for urinary retention. He passed his voiding trial last visit.  PVR today is 376 but he states he is urinating well with a good urine stream. No straining to urinate. NO urinary hesitancy. He does have occasional urinary urgency. No other complaints today ? ? ?PMH: ?Past Medical History:  ?Diagnosis Date  ? Acid reflux   ? Anemia   ? Colon cancer ([place])   ? per patient, colon cancer more than 10 years ago s/p right hemicolectomy  ? Ex-cigarette smoker   ? Gout   ? Hyperlipemia   ? Hypertension   ? Paroxysmal atrial fibrillation ([place]) 2014  ? Prostate cancer ([place]) 08/08/2013  ? SEEDS 20 YEARS AGO  ? Prostate disease   ? ? ?Surgical History: ?Past Surgical History:  ?Procedure Laterality Date  ? BIOPSY  02/05/2021  ? Procedure: BIOPSY;  Surgeon: Harvel Quale, MD;  [facility];  Service: Gastroenterology;;  ? COLON SURGERY    ? Right hemicolectomy in the setting of colon cancer, per patient  ? CYSTOSCOPY WITH INSERTION OF UROLIFT N/A 05/19/2021  ? Procedure: CYSTOSCOPY WITH INSERTION OF UROLIFT;  Surgeon: Cleon Gustin, MD;  Location: [facility] ORS;  Service: Urology;  Laterality: N/A;  ? ESOPHAGOGASTRODUODENOSCOPY (EGD) WITH PROPOFOL N/A 02/05/2021  ? Procedure: ESOPHAGOGASTRODUODENOSCOPY (EGD) WITH PROPOFOL;  Surgeon: Harvel Quale, MD;  [facility];  Service: Gastroenterology;  Laterality: N/A;  ? LEFT HEART CATHETERIZATION WITH CORONARY ANGIOGRAM N/A 07/21/2012  ? Procedure: LEFT HEART CATHETERIZATION WITH CORONARY ANGIOGRAM;  Surgeon: Troy Sine, MD;  Location: [facility] CATH LAB;  Service: Cardiovascular;  Laterality: N/A;  ? PACEMAKER INSERTION    ? PERMANENT PACEMAKER INSERTION N/A 07/22/2012  ? Procedure: PERMANENT PACEMAKER INSERTION;  Surgeon: Sanda Klein, MD;  Location: [facility] CATH LAB;  Service: Cardiovascular;  Laterality: N/A;  ? PROSTATE SURGERY    ? ? ?Home Medications:  ?Allergies as of 05/23/2021   ?No Known Allergies ?  ? ?  ?Medication List  ?  ? ?  ? Accurate as of May 23, 2021  9:30 AM. If you have any questions, ask your nurse or doctor.  ?  ?  ? ?  ? ?abiraterone acetate 250 MG tablet ?Commonly known as: ZYTIGA ?Take 2 tablets (500 mg total) by mouth daily. Take on an empty stomach 1 hour before or 2 hours after a meal ?  ?acetaminophen 650 MG CR tablet ?Commonly known as: TYLENOL ?Take 1,300 mg by mouth every 8 (eight) hours as needed for pain. ?  ?ascorbic acid 250 MG tablet ?Commonly known as: VITAMIN C ?TAKE ONE TABLET BY MOUTH THREE TIMES A DAY WITH MEALS - TAKE WITH IRON TO PROMOTE BETTER ABSORPTION ?  ?atorvastatin 20 MG tablet ?Commonly known as: LIPITOR ?Take 1 tablet (20 mg total) by mouth daily at 6 PM. ?  ?carvedilol 3.125 MG tablet ?Commonly known as: Coreg ?Take 1 tablet (3.125 mg total) by mouth 2 (two) times daily. ?  ?Cholecalciferol 25 MCG (1000 UT) tablet ?Take 1,000 Units by mouth daily. ?  ?clotrimazole-betamethasone cream ?Commonly known as: Lotrisone ?Apply 1 application. topically 2 (two) times daily. ?  ?diclofenac sodium 1 % Gel ?Commonly known as: VOLTAREN ?Apply 2 g topically 4 (four) times daily. ?What changed:  ?when to  take this ?reasons to take this ?  ?doxycycline 100 MG capsule ?Commonly known as: VIBRAMYCIN ?Take 1 capsule (100 mg total) by mouth every 12 (twelve) hours. ?  ?Ensure ?Take 237 mLs by mouth daily as needed (nutrition). Vanilla or chocolate ?  ?ferrous sulfate 324 MG Tbec ?Take 324 mg by mouth 2 (two) times daily. ?  ?fluticasone 50 MCG/ACT nasal spray ?Commonly known as: FLONASE ?Place 2 sprays into both nostrils daily. ?What changed:  ?when to take this ?reasons to take this ?  ?furosemide 40 MG tablet ?Commonly known as: LASIX ?TAKE ONE TABLET BY MOUTH ONCE EVERY DAY FOR BLOOD PRESSURE AND FOR FLUID  CONTROL ?  ?megestrol 400 MG/10ML suspension ?Commonly known as: MEGACE ?Take 10 mLs (400 mg total) by mouth 2 (two) times daily. ?  ?pantoprazole 40 MG tablet ?Commonly known as: Protonix ?Take 1 tablet (40 mg total) by mouth daily. ?  ?potassium chloride SA 20 MEQ tablet ?Commonly known as: KLOR-CON M ?Take 20 mEq by mouth daily. ?  ?predniSONE 5 MG tablet ?Commonly known as: DELTASONE ?Take 1 tablet (5 mg total) by mouth daily with breakfast. ?  ?rivaroxaban 20 MG Tabs tablet ?Commonly known as: XARELTO ?Take 1 tablet (20 mg total) by mouth daily with supper. ?  ?tamsulosin 0.4 MG Caps capsule ?Commonly known as: FLOMAX ?Take 1 capsule (0.4 mg total) by mouth daily after supper. ?  ?traMADol 50 MG tablet ?Commonly known as: ULTRAM ?Take 1 tablet (50 mg total) by mouth 2 (two) times daily. ?  ? ?  ? ? ?Allergies: No Known Allergies ? ?Family History: ?Family History  ?Problem Relation Age of Onset  ? Cancer Neg Hx   ? ? ?Social History:  reports that he quit smoking about 18 years ago. He has never used smokeless tobacco. He reports that he does not drink alcohol and does not use drugs. ? ?ROS: ?All other review of systems were reviewed and are negative except what is noted above in HPI ? ?Physical Exam: ?There were no vitals taken for this visit.  ?Constitutional:  Alert and oriented, No acute distress. ?HEENT: [place] AT, moist mucus membranes.  Trachea midline, no masses. ?Cardiovascular: No clubbing, cyanosis, or edema. ?Respiratory: Normal respiratory effort, no increased work of breathing. ?GI: Abdomen is soft, nontender, nondistended, no abdominal masses ?GU: No CVA tenderness.  ?Lymph: No cervical or inguinal lymphadenopathy. ?Skin: No rashes, bruises or suspicious lesions. ?Neurologic: Grossly intact, no focal deficits, moving all 4 extremities. ?Psychiatric: Normal mood and affect. ? ?Laboratory Data: ?Lab Results  ?Component Value Date  ? WBC 5.9 04/14/2021  ? HGB 11.3 (L) 04/14/2021  ? HCT 32.8 (L)  04/14/2021  ? MCV 104.1 (H) 04/14/2021  ? PLT 199 04/14/2021  ? ? ?Lab Results  ?Component Value Date  ? CREATININE 1.34 (H) 04/14/2021  ? ? ?Lab Results  ?Component Value Date  ? PSA Refused 12/22/2007  ? PSA 0.07 (L) 06/24/2007  ? PSA 0.10 05/25/2006  ? ? ?No results found for: TESTOSTERONE ? ?Lab Results  ?Component Value Date  ? HGBA1C 5.6 07/19/2012  ? ? ?Urinalysis ?   ?Component Value Date/Time  ? COLORURINE YELLOW 03/26/2021 1101  ? APPEARANCEUR Hazy (A) 05/22/2021 0920  ? LABSPEC 1.010 03/26/2021 1101  ? PHURINE 5.0 03/26/2021 1101  ? GLUCOSEU Negative 05/22/2021 0920  ? HGBUR MODERATE (A) 03/26/2021 1101  ? HGBUR negative 06/24/2007 0833  ? BILIRUBINUR Negative 05/22/2021 0920  ? [place] NEGATIVE 03/26/2021 1101  ? PROTEINUR 1+ (A) 05/22/2021  0920  ? PROTEINUR 30 (A) 03/26/2021 1101  ? UROBILINOGEN 1.0 07/29/2013 2032  ? NITRITE Negative 05/22/2021 0920  ? NITRITE NEGATIVE 03/26/2021 1101  ? LEUKOCYTESUR Trace (A) 05/22/2021 0920  ? LEUKOCYTESUR LARGE (A) 03/26/2021 1101  ? ? ?Lab Results  ?Component Value Date  ? LABMICR See below: 05/22/2021  ? WBCUA 0-5 05/22/2021  ? LABEPIT None seen 05/22/2021  ? MUCUS Present 05/22/2021  ? BACTERIA None seen 05/22/2021  ? ? ?Pertinent Imaging: ? ?Results for orders placed in visit on 07/29/01 ? ?DG Abd 1 View ? ?Narrative ?FINDINGS ?CLINICAL DATA:  PROSTATE CA. ?SINGLE VIEW ABDOMEN ?A NORMAL BOWEL GAS PATTERN IS DEMONSTRATED.  MILD TO MODERATE SPUR FORMATION IS DEMONSTRATED AT ?MULTIPLE LEVELS OF THE LUMBAR SPINE AS WELL AS MINIMAL LEVOCONVEX LUMBAR ROTARY SCOLIOSIS.  ALSO ?NOTED IS BRIDGING OSSIFICATION OR CALCIFICATION BETWEEN THE LOWER LUMBAR SPINE AND LEFT ILIAC WING. ?A TRANSITIONAL LUMBOSACRAL VERTEBRA IS ALSO DEMONSTRATED.  MILD ATHEROMATOUS ARTERIAL ?CALCIFICATIONS ARE NOTED. ?IMPRESSION ?NO ACUTE ABNORMALITY AND NO EVIDENCE OF BONY METASTATIC DISEASE. ? ?No results found for this or any previous visit. ? ?No results found for this or any previous  visit. ? ?No results found for this or any previous visit. ? ?Results for orders placed during the hospital encounter of 08/01/13 ? ?US Renal ? ?Narrative ?CLINICAL DATA:  Acute urinary retention ? ?EXAM: ?RENAL/URINAR

## 2021-05-23 NOTE — Progress Notes (Signed)
? ?05/21/2021 ?9:31 PM  ? ?Derrick Long ?[birth year] ?[id number] ? ?Referring provider: [facility], [facility] ?[address] ?[place],  [place] [address] ? ?Followup urinary retention ? ? ?HPI: ?Derrick Long is a 88yo here for followup for urinary retention. He underwent Urolift 2 day ago and foley was removed today. He was able to urinate multiple times since the foley was removed. No dysuria. No urinary frequency or urgency. No other complaints today ? ? ?PMH: ?Past Medical History:  ?Diagnosis Date  ? Acid reflux   ? Anemia   ? Colon cancer ([place])   ? per patient, colon cancer more than 10 years ago s/p right hemicolectomy  ? Ex-cigarette smoker   ? Gout   ? Hyperlipemia   ? Hypertension   ? Paroxysmal atrial fibrillation ([place]) 2014  ? Prostate cancer ([place]) 08/08/2013  ? SEEDS 20 YEARS AGO  ? Prostate disease   ? ? ?Surgical History: ?Past Surgical History:  ?Procedure Laterality Date  ? BIOPSY  02/05/2021  ? Procedure: BIOPSY;  Surgeon: Harvel Quale, MD;  [facility];  Service: Gastroenterology;;  ? COLON SURGERY    ? Right hemicolectomy in the setting of colon cancer, per patient  ? CYSTOSCOPY WITH INSERTION OF UROLIFT N/A 05/19/2021  ? Procedure: CYSTOSCOPY WITH INSERTION OF UROLIFT;  Surgeon: Cleon Gustin, MD;  Location: [facility];  Service: Urology;  Laterality: N/A;  ? ESOPHAGOGASTRODUODENOSCOPY (EGD) WITH PROPOFOL N/A 02/05/2021  ? Procedure: ESOPHAGOGASTRODUODENOSCOPY (EGD) WITH PROPOFOL;  Surgeon: Harvel Quale, MD;  [facility];  Service: Gastroenterology;  Laterality: N/A;  ? LEFT HEART CATHETERIZATION WITH CORONARY ANGIOGRAM N/A 07/21/2012  ? Procedure: LEFT HEART CATHETERIZATION WITH CORONARY ANGIOGRAM;  Surgeon: Troy Sine, MD;  [facility]: [facility] CATH LAB;  Service: Cardiovascular;  Laterality: N/A;  ? PACEMAKER INSERTION    ? PERMANENT PACEMAKER INSERTION N/A 07/22/2012  ? Procedure: PERMANENT PACEMAKER INSERTION;  Surgeon: Sanda Klein, MD;  Location: [facility]  CATH LAB;  Service: Cardiovascular;  Laterality: N/A;  ? PROSTATE SURGERY    ? ? ?Home Medications:  ?Allergies as of 05/21/2021   ?No Known Allergies ?  ? ?  ?Medication List  ?  ? ?  ? Accurate as of May 21, 2021 11:59 PM. If you have any questions, ask your nurse or doctor.  ?  ?  ? ?  ? ?STOP taking these medications   ? ?Gemtesa 75 MG Tabs ?Generic drug: Vibegron ?Stopped by: Nicolette Bang, MD ?  ?sulfamethoxazole-trimethoprim 400-80 MG tablet ?Commonly known as: Bactrim ?Stopped by: Nicolette Bang, MD ?  ? ?  ? ?TAKE these medications   ? ?abiraterone acetate 250 MG tablet ?Commonly known as: ZYTIGA ?Take 2 tablets (500 mg total) by mouth daily. Take on an empty stomach 1 hour before or 2 hours after a meal ?  ?acetaminophen 650 MG CR tablet ?Commonly known as: TYLENOL ?Take 1,300 mg by mouth every 8 (eight) hours as needed for pain. ?  ?ascorbic acid 250 MG tablet ?Commonly known as: VITAMIN C ?TAKE ONE TABLET BY MOUTH THREE TIMES A DAY WITH MEALS - TAKE WITH IRON TO PROMOTE BETTER ABSORPTION ?  ?atorvastatin 20 MG tablet ?Commonly known as: LIPITOR ?Take 1 tablet (20 mg total) by mouth daily at 6 PM. ?  ?carvedilol 3.125 MG tablet ?Commonly known as: Coreg ?Take 1 tablet (3.125 mg total) by mouth 2 (two) times daily. ?  ?Cholecalciferol 25 MCG (1000 UT) tablet ?Take 1,000 Units by mouth daily. ?  ?  clotrimazole-betamethasone cream ?Commonly known as: Lotrisone ?Apply 1 application. topically 2 (two) times daily. ?  ?diclofenac sodium 1 % Gel ?Commonly known as: VOLTAREN ?Apply 2 g topically 4 (four) times daily. ?What changed:  ?when to take this ?reasons to take this ?  ?Ensure ?Take 237 mLs by mouth daily as needed (nutrition). Vanilla or chocolate ?  ?ferrous sulfate 324 MG Tbec ?Take 324 mg by mouth 2 (two) times daily. ?  ?fluticasone 50 MCG/ACT nasal spray ?Commonly known as: FLONASE ?Place 2 sprays into both nostrils daily. ?What changed:  ?when to take this ?reasons to take this ?  ?furosemide 40  MG tablet ?Commonly known as: LASIX ?TAKE ONE TABLET BY MOUTH ONCE EVERY DAY FOR BLOOD PRESSURE AND FOR FLUID CONTROL ?  ?megestrol 400 MG/10ML suspension ?Commonly known as: MEGACE ?Take 10 mLs (400 mg total) by mouth 2 (two) times daily. ?  ?pantoprazole 40 MG tablet ?Commonly known as: Protonix ?Take 1 tablet (40 mg total) by mouth daily. ?  ?potassium chloride SA 20 MEQ tablet ?Commonly known as: KLOR-CON M ?Take 20 mEq by mouth daily. ?  ?predniSONE 5 MG tablet ?Commonly known as: DELTASONE ?Take 1 tablet (5 mg total) by mouth daily with breakfast. ?  ?rivaroxaban 20 MG Tabs tablet ?Commonly known as: XARELTO ?Take 1 tablet (20 mg total) by mouth daily with supper. ?  ?tamsulosin 0.4 MG Caps capsule ?Commonly known as: FLOMAX ?Take 1 capsule (0.4 mg total) by mouth daily after supper. ?  ?traMADol 50 MG tablet ?Commonly known as: ULTRAM ?Take 1 tablet (50 mg total) by mouth 2 (two) times daily. ?  ? ?  ? ? ?Allergies: No Known Allergies ? ?Family History: ?Family History  ?Problem Relation Age of Onset  ? Cancer Neg Hx   ? ? ?Social History:  reports that he quit smoking about 18 years ago. He has never used smokeless tobacco. He reports that he does not drink alcohol and does not use drugs. ? ?ROS: ?All other review of systems were reviewed and are negative except what is noted above in HPI ? ?Physical Exam: ?BP (!) 166/85   Pulse 72   ?Constitutional:  Alert and oriented, No acute distress. ?HEENT: [place] AT, moist mucus membranes.  Trachea midline, no masses. ?Cardiovascular: No clubbing, cyanosis, or edema. ?Respiratory: Normal respiratory effort, no increased work of breathing. ?GI: Abdomen is soft, nontender, nondistended, no abdominal masses ?GU: No CVA tenderness.  ?Lymph: No cervical or inguinal lymphadenopathy. ?Skin: No rashes, bruises or suspicious lesions. ?Neurologic: Grossly intact, no focal deficits, moving all 4 extremities. ?Psychiatric: Normal mood and affect. ? ?Laboratory Data: ?Lab Results   ?Component Value Date  ? WBC 5.9 04/14/2021  ? HGB 11.3 (L) 04/14/2021  ? HCT 32.8 (L) 04/14/2021  ? MCV 104.1 (H) 04/14/2021  ? PLT 199 04/14/2021  ? ? ?Lab Results  ?Component Value Date  ? CREATININE 1.34 (H) 04/14/2021  ? ? ?Lab Results  ?Component Value Date  ? PSA Refused 12/22/2007  ? PSA 0.07 (L) 06/24/2007  ? PSA 0.10 05/25/2006  ? ? ?No results found for: TESTOSTERONE ? ?Lab Results  ?Component Value Date  ? HGBA1C 5.6 07/19/2012  ? ? ?Urinalysis ?   ?Component Value Date/Time  ? COLORURINE YELLOW 03/26/2021 1101  ? APPEARANCEUR Hazy (A) 05/22/2021 0920  ? LABSPEC 1.010 03/26/2021 1101  ? PHURINE 5.0 03/26/2021 1101  ? GLUCOSEU Negative 05/22/2021 0920  ? HGBUR MODERATE (A) 03/26/2021 1101  ? HGBUR negative 06/24/2007 0833  ?  BILIRUBINUR Negative 05/22/2021 0920  ? [place] NEGATIVE 03/26/2021 1101  ? PROTEINUR 1+ (A) 05/22/2021 0920  ? PROTEINUR 30 (A) 03/26/2021 1101  ? UROBILINOGEN 1.0 07/29/2013 2032  ? NITRITE Negative 05/22/2021 0920  ? NITRITE NEGATIVE 03/26/2021 1101  ? LEUKOCYTESUR Trace (A) 05/22/2021 0920  ? LEUKOCYTESUR LARGE (A) 03/26/2021 1101  ? ? ?Lab Results  ?Component Value Date  ? LABMICR See below: 05/22/2021  ? WBCUA 0-5 05/22/2021  ? LABEPIT None seen 05/22/2021  ? MUCUS Present 05/22/2021  ? BACTERIA None seen 05/22/2021  ? ? ?Pertinent Imaging: ? ?Results for orders placed in visit on 07/29/01 ? ?DG Abd 1 View ? ?Narrative ?FINDINGS ?CLINICAL DATA:  PROSTATE CA. ?SINGLE VIEW ABDOMEN ?A NORMAL BOWEL GAS PATTERN IS DEMONSTRATED.  MILD TO MODERATE SPUR FORMATION IS DEMONSTRATED AT ?MULTIPLE LEVELS OF THE LUMBAR SPINE AS WELL AS MINIMAL LEVOCONVEX LUMBAR ROTARY SCOLIOSIS.  ALSO ?NOTED IS BRIDGING OSSIFICATION OR CALCIFICATION BETWEEN THE LOWER LUMBAR SPINE AND LEFT ILIAC WING. ?A TRANSITIONAL LUMBOSACRAL VERTEBRA IS ALSO DEMONSTRATED.  MILD ATHEROMATOUS ARTERIAL ?CALCIFICATIONS ARE NOTED. ?IMPRESSION ?NO ACUTE ABNORMALITY AND NO EVIDENCE OF BONY METASTATIC DISEASE. ? ?No results  found for this or any previous visit. ? ?No results found for this or any previous visit. ? ?No results found for this or any previous visit. ? ?Results for orders placed during the hospital encounter of 06/

## 2021-05-24 ENCOUNTER — Emergency Department (HOSPITAL_COMMUNITY): Payer: Medicare Other

## 2021-05-24 ENCOUNTER — Encounter (HOSPITAL_COMMUNITY): Payer: Self-pay | Admitting: Emergency Medicine

## 2021-05-24 ENCOUNTER — Other Ambulatory Visit: Payer: Self-pay

## 2021-05-24 ENCOUNTER — Emergency Department (HOSPITAL_COMMUNITY)
Admission: EM | Admit: 2021-05-24 | Discharge: 2021-05-24 | Disposition: A | Discharge status: Home / Self Care | Payer: Medicare Other | Attending: Emergency Medicine | Admitting: Emergency Medicine

## 2021-05-24 DIAGNOSIS — S46201A Unspecified injury of muscle, fascia and tendon of other parts of biceps, right arm, initial encounter: Secondary | ICD-10-CM | POA: Diagnosis not present

## 2021-05-24 DIAGNOSIS — Z7901 Long term (current) use of anticoagulants: Secondary | ICD-10-CM | POA: Diagnosis not present

## 2021-05-24 DIAGNOSIS — S4991XA Unspecified injury of right shoulder and upper arm, initial encounter: Secondary | ICD-10-CM | POA: Diagnosis present

## 2021-05-24 DIAGNOSIS — W19XXXA Unspecified fall, initial encounter: Secondary | ICD-10-CM | POA: Diagnosis not present

## 2021-05-24 DIAGNOSIS — S46209A Unspecified injury of muscle, fascia and tendon of other parts of biceps, unspecified arm, initial encounter: Secondary | ICD-10-CM

## 2021-05-24 DIAGNOSIS — F0390 Unspecified dementia without behavioral disturbance: Secondary | ICD-10-CM | POA: Diagnosis not present

## 2021-05-24 LAB — URINE CULTURE: Organism ID, Bacteria: NO GROWTH

## 2021-05-24 MED ORDER — TRAMADOL HCL 50 MG PO TABS: 50.0000 mg | ORAL_TABLET | Freq: Four times a day (QID) | ORAL | 0 refills | Status: DC | PRN

## 2021-05-24 MED ORDER — CARVEDILOL 3.125 MG PO TABS
3.1250 mg | ORAL_TABLET | Freq: Two times a day (BID) | ORAL | Status: DC
  Administered 2021-05-24: 3.125 mg via ORAL
  Filled 2021-05-24: qty 1

## 2021-05-24 NOTE — ED Triage Notes (Signed)
Pt to the ED with complaints of right shoulder pain after a fall. The fall was a month and a half ago. ? ?

## 2021-05-24 NOTE — ED Notes (Signed)
Pt verbalized he takes bp meds daily but did not take it prior to ed visit.  ?

## 2021-05-24 NOTE — Discharge Instructions (Signed)
Wear the sling as needed for comfort.  Use heat on the sore area 3-4 times a day.  Use the pain medicine as needed, but watch for sedation when taking it.  Call the orthopedic doctor for a follow-up appointment. ?

## 2021-05-24 NOTE — ED Provider Notes (Signed)
?[facility] ?Provider Note ? ? ?CSN: [id number] ?Arrival date & time: 05/24/21  9024 ? ?  ? ?History ? ?Chief Complaint  ?Patient presents with  ? Shoulder Injury  ? ? ?Derrick Long is a 88 y.o. male. ? ?HPI ?Patient presents for evaluation of right upper arm/shoulder pain, without specific known trauma.  He is here with his daughter who suspects that he injured it when he was carrying a TV about 4 weeks ago.  Patient has dementia and cannot specify anything additionally.  Daughter states that the patient, "tries to hide things from [place]."  He typically dresses himself and takes care of some of his ADLs. ?  ? ?Home Medications ?Prior to Admission medications   ?Medication Sig Start Date End Date Taking? Authorizing Provider  ?traMADol (ULTRAM) 50 MG tablet Take 1 tablet (50 mg total) by mouth every 6 (six) hours as needed. 05/24/21  Yes Daleen Bo, MD  ?abiraterone acetate (ZYTIGA) 250 MG tablet Take 2 tablets (500 mg total) by mouth daily. Take on an empty stomach 1 hour before or 2 hours after a meal 02/26/21   Derek Jack, MD  ?acetaminophen (TYLENOL) 650 MG CR tablet Take 1,300 mg by mouth every 8 (eight) hours as needed for pain.    [provider]  ?ascorbic acid (VITAMIN C) 250 MG tablet TAKE ONE TABLET BY MOUTH THREE TIMES A DAY WITH MEALS - TAKE WITH IRON TO PROMOTE BETTER ABSORPTION 03/18/21   [provider]  ?atorvastatin (LIPITOR) 20 MG tablet Take 1 tablet (20 mg total) by mouth daily at 6 PM. 07/23/12   Consuelo Pandy, PA-C  ?carvedilol (COREG) 3.125 MG tablet Take 1 tablet (3.125 mg total) by mouth 2 (two) times daily. 03/28/21 05/15/23  Antonieta Pert, MD  ?Cholecalciferol 25 MCG (1000 UT) tablet Take 1,000 Units by mouth daily.    [provider]  ?clotrimazole-betamethasone (LOTRISONE) cream Apply 1 application. topically 2 (two) times daily. 05/21/21   McKenzie, Candee Furbish, MD  ?diclofenac sodium (VOLTAREN) 1 % GEL Apply 2 g topically 4 (four)  times daily. ?Patient taking differently: Apply 2 g topically 4 (four) times daily as needed (pain). 06/19/17   Rancour, Annie Main, MD  ?doxycycline (VIBRAMYCIN) 100 MG capsule Take 1 capsule (100 mg total) by mouth every 12 (twelve) hours. 05/22/21   McKenzie, Candee Furbish, MD  ?Ensure (ENSURE) Take 237 mLs by mouth daily as needed (nutrition). Vanilla or chocolate    [provider]  ?ferrous sulfate 324 MG TBEC Take 324 mg by mouth 2 (two) times daily.    [provider]  ?fluticasone (FLONASE) 50 MCG/ACT nasal spray Place 2 sprays into both nostrils daily. ?Patient taking differently: Place 2 sprays into both nostrils daily as needed for allergies. 02/15/16   Ward, Delice Bison, DO  ?furosemide (LASIX) 40 MG tablet TAKE ONE TABLET BY MOUTH ONCE EVERY DAY FOR BLOOD PRESSURE AND FOR FLUID CONTROL 03/18/21   [provider]  ?megestrol (MEGACE) 400 MG/10ML suspension Take 10 mLs (400 mg total) by mouth 2 (two) times daily. 03/20/21   Derek Jack, MD  ?pantoprazole (PROTONIX) 40 MG tablet Take 1 tablet (40 mg total) by mouth daily. 03/20/21   Derek Jack, MD  ?potassium chloride SA (KLOR-CON) 20 MEQ tablet Take 20 mEq by mouth daily. 02/28/20   [provider]  ?predniSONE (DELTASONE) 5 MG tablet Take 1 tablet (5 mg total) by mouth daily with breakfast. 01/15/21   Derek Jack, MD  ?rivaroxaban Alveda Reasons)  20 MG TABS tablet Take 1 tablet (20 mg total) by mouth daily with supper. 10/30/13   Croitoru, Mihai, MD  ?tamsulosin (FLOMAX) 0.4 MG CAPS capsule Take 1 capsule (0.4 mg total) by mouth daily after supper. 05/07/21   McKenzie, Candee Furbish, MD  ?   ? ?Allergies    ?Patient has no known allergies.   ? ?Review of Systems   ?Review of Systems ? ?Physical Exam ?Updated Vital Signs ?BP (!) 219/81   Pulse (!) 57   Temp 98.1 ?F (36.7 ?C) (Oral)   Resp (!) 30   Ht '6\' 1"'$  (1.854 m)   Wt 83.5 kg   SpO2 100%   BMI 24.29 kg/m?  ?Physical Exam ?Vitals and nursing note reviewed.   ?Constitutional:   ?   General: He is not in acute distress. ?   Appearance: He is well-developed. He is not ill-appearing or diaphoretic.  ?HENT:  ?   Head: Normocephalic and atraumatic.  ?   Right Ear: External ear normal.  ?   Left Ear: External ear normal.  ?Eyes:  ?   Conjunctiva/sclera: Conjunctivae normal.  ?   Pupils: Pupils are equal, round, and reactive to light.  ?Neck:  ?   Trachea: Phonation normal.  ?Cardiovascular:  ?   Rate and Rhythm: Normal rate.  ?Pulmonary:  ?   Effort: Pulmonary effort is normal.  ?Abdominal:  ?   General: There is no distension.  ?Musculoskeletal:     ?   General: Swelling and tenderness present.  ?   Cervical back: Normal range of motion and neck supple.  ?   Comments: Right shoulder is swollen, tender and he resists motion both actively and passively secondary to pain.  There is bruising of the right upper arm, from the shoulder to the elbow, with mild swelling.  Right biceps, is bulging, and the distal third, from suspected proximal bicep muscle or tendon injury.  ?Skin: ?   General: Skin is warm and dry.  ?Neurological:  ?   Mental Status: He is alert and oriented to person, place, and time.  ?   Cranial Nerves: No cranial nerve deficit.  ?   Sensory: No sensory deficit.  ?   Motor: No abnormal muscle tone.  ?   Coordination: Coordination normal.  ?Psychiatric:     ?   Mood and Affect: Mood normal.     ?   Behavior: Behavior normal.     ?   Thought Content: Thought content normal.     ?   Judgment: Judgment normal.  ? ? ?ED Results / Procedures / Treatments   ?Labs ?(all labs ordered are listed, but only abnormal results are displayed) ?Labs Reviewed - No data to display ? ?EKG ?EKG Interpretation ? ?Date/Time:  Saturday May 24 2021 09:01:56 EDT ?Ventricular Rate:  74 ?PR Interval:    ?QRS Duration: 100 ?QT Interval:  394 ?QTC Calculation: 438 ?R Axis:   -3 ?Text Interpretation: Atrial fibrillation Abnormal T, consider ischemia, diffuse leads Since last tracing now  native pacing and nonspecific T wave abnormality Confirmed by Daleen Bo [phone]) on 05/24/2021 10:47:20 AM ? ? EKG Interpretation ? ?Date/Time:  Saturday May 24 2021 09:04:49 EDT ?Ventricular Rate:  80 ?PR Interval:    ?QRS Duration: 184 ?QT Interval:  509 ?QTC Calculation: 534 ?R Axis:   -84 ?Text Interpretation: Atrial fibrillation Electronic demand pacing (spikes barely visible) prior tracing native Otherwise no significant change Confirmed by Daleen Bo [phone]) on 05/24/2021 10:49:37 AM ?  ? ?  ? ?  EKG Interpretation ? ?Date/Time:  Saturday May 24 2021 09:09:31 EDT ?Ventricular Rate:  71 ?PR Interval:  69 ?QRS Duration: 161 ?QT Interval:  412 ?QTC Calculation: 391 ?R Axis:   5 ?Text Interpretation: A-V dual-paced rhythm with some inhibition No further analysis attempted due to paced rhythm Since last tracing pacer spikes? clearly visible Otherwise no significant change Confirmed by Daleen Bo [phone]) on 05/24/2021 10:51:16 AM ?  ? ?  ?  ? ?Radiology ?DG Shoulder Right ? ?Result Date: 05/24/2021 ?CLINICAL DATA:  Fall 1 month ago, RIGHT shoulder pain and bruising. EXAM: RIGHT SHOULDER - 2+ VIEW COMPARISON:  None. FINDINGS: Osseous alignment is normal. No fracture line or displaced fracture fragment is identified. Mild degenerative spurring at the glenohumeral and acromioclavicular joint spaces. Soft tissues about the RIGHT shoulder are unremarkable. IMPRESSION: 1. No acute findings. No osseous fracture or dislocation. 2. Mild degenerative change. Electronically Signed   By: Franki Cabot M.D.   On: 05/24/2021 09:46   ? ?Procedures ?Procedures  ? ? ?Medications Ordered in ED ?Medications - No data to display ? ? ?ED Course/ Medical Decision Making/ A&P ?  ?                        ?Medical Decision Making ?Patient presenting with atraumatic injury to right upper arm visualized as bruising by daughter.  She suspects that he injured it about a month ago when he was lifting a TV.  He was seen by urology,  yesterday to follow-up on urinary retention. ? ?Amount and/or Complexity of Data Reviewed ?Independent Historian: caregiver ?   Details: Daughter at bedside gives history ?External Data Reviewed: labs and notes. ?   Details:

## 2021-05-25 ENCOUNTER — Other Ambulatory Visit: Payer: Self-pay

## 2021-05-25 ENCOUNTER — Emergency Department (HOSPITAL_COMMUNITY)
Admission: EM | Admit: 2021-05-25 | Discharge: 2021-05-25 | Disposition: A | Discharge status: Home / Self Care | Payer: Medicare Other | Attending: Emergency Medicine | Admitting: Emergency Medicine

## 2021-05-25 ENCOUNTER — Encounter (HOSPITAL_COMMUNITY): Payer: Self-pay

## 2021-05-25 DIAGNOSIS — R42 Dizziness and giddiness: Secondary | ICD-10-CM | POA: Insufficient documentation

## 2021-05-25 DIAGNOSIS — Z7901 Long term (current) use of anticoagulants: Secondary | ICD-10-CM | POA: Insufficient documentation

## 2021-05-25 LAB — CBC
HCT: 34.2 % — ABNORMAL LOW (ref 39.0–52.0)
Hemoglobin: 11.3 g/dL — ABNORMAL LOW (ref 13.0–17.0)
MCH: 34.8 pg — ABNORMAL HIGH (ref 26.0–34.0)
MCHC: 33 g/dL (ref 30.0–36.0)
MCV: 105.2 fL — ABNORMAL HIGH (ref 80.0–100.0)
Platelets: 264 10*3/uL (ref 150–400)
RBC: 3.25 MIL/uL — ABNORMAL LOW (ref 4.22–5.81)
RDW: 13.1 % (ref 11.5–15.5)
WBC: 7.5 10*3/uL (ref 4.0–10.5)
nRBC: 0 % (ref 0.0–0.2)

## 2021-05-25 LAB — BASIC METABOLIC PANEL
Anion gap: 8 (ref 5–15)
BUN: 24 mg/dL — ABNORMAL HIGH (ref 8–23)
CO2: 21 mmol/L — ABNORMAL LOW (ref 22–32)
Calcium: 9.2 mg/dL (ref 8.9–10.3)
Chloride: 106 mmol/L (ref 98–111)
Creatinine, Ser: 1.38 mg/dL — ABNORMAL HIGH (ref 0.61–1.24)
GFR, Estimated: 48 mL/min — ABNORMAL LOW (ref >60)
Glucose, Bld: 118 mg/dL — ABNORMAL HIGH (ref 70–99)
Potassium: 4.2 mmol/L (ref 3.5–5.1)
Sodium: 135 mmol/L (ref 135–145)

## 2021-05-25 LAB — URINALYSIS, ROUTINE W REFLEX MICROSCOPIC
Bilirubin Urine: NEGATIVE
Glucose, UA: NEGATIVE mg/dL
Ketones, ur: NEGATIVE mg/dL
Leukocytes,Ua: NEGATIVE
Nitrite: NEGATIVE
Protein, ur: 100 mg/dL — AB
Specific Gravity, Urine: 1.012 (ref 1.005–1.030)
pH: 5 (ref 5.0–8.0)

## 2021-05-25 LAB — CBG MONITORING, ED: Glucose-Capillary: 125 mg/dL — ABNORMAL HIGH (ref 70–99)

## 2021-05-25 NOTE — ED Provider Notes (Signed)
? ?[facility]  ?Provider Note ? ?CSN: [id number] ?Arrival date & time: 05/25/21 1625 ? ?History ?Chief Complaint  ?Patient presents with  ? Dizziness  ? ? ?Derrick Long is a 88 y.o. male with history of multiple medical problems has intermittent episodes of dizziness, has had several prior admissions for same with negative workups He was in the ED yesterday for R arm injury felt to be a biceps tendon injury, now wearing a sling with improved pain. Patient and son at bedside report he had several episodes of dizziness during the day today, required his daughter to assist him. He did not fall or lose consciousness this time. He has been feeling better since arrival to the ED and has ambulated to the restroom several times without difficulty. Denies fever, cough, CP, SOB, N/V/D or dysuria.  ? ? ?Home Medications ?Prior to Admission medications   ?Medication Sig Start Date End Date Taking? Authorizing Provider  ?abiraterone acetate (ZYTIGA) 250 MG tablet Take 2 tablets (500 mg total) by mouth daily. Take on an empty stomach 1 hour before or 2 hours after a meal 02/26/21   Derek Jack, MD  ?acetaminophen (TYLENOL) 650 MG CR tablet Take 1,300 mg by mouth every 8 (eight) hours as needed for pain.    [provider]  ?ascorbic acid (VITAMIN C) 250 MG tablet TAKE ONE TABLET BY MOUTH THREE TIMES A DAY WITH MEALS - TAKE WITH IRON TO PROMOTE BETTER ABSORPTION 03/18/21   [provider]  ?atorvastatin (LIPITOR) 20 MG tablet Take 1 tablet (20 mg total) by mouth daily at 6 PM. 07/23/12   Consuelo Pandy, PA-C  ?carvedilol (COREG) 3.125 MG tablet Take 1 tablet (3.125 mg total) by mouth 2 (two) times daily. 03/28/21 05/15/23  Antonieta Pert, MD  ?Cholecalciferol 25 MCG (1000 UT) tablet Take 1,000 Units by mouth daily.    [provider]  ?clotrimazole-betamethasone (LOTRISONE) cream Apply 1 application. topically 2 (two) times daily. 05/21/21   McKenzie, Candee Furbish, MD  ?diclofenac  sodium (VOLTAREN) 1 % GEL Apply 2 g topically 4 (four) times daily. ?Patient taking differently: Apply 2 g topically 4 (four) times daily as needed (pain). 06/19/17   Rancour, Annie Main, MD  ?doxycycline (VIBRAMYCIN) 100 MG capsule Take 1 capsule (100 mg total) by mouth every 12 (twelve) hours. 05/22/21   McKenzie, Candee Furbish, MD  ?Ensure (ENSURE) Take 237 mLs by mouth daily as needed (nutrition). Vanilla or chocolate    [provider]  ?ferrous sulfate 324 MG TBEC Take 324 mg by mouth 2 (two) times daily.    [provider]  ?fluticasone (FLONASE) 50 MCG/ACT nasal spray Place 2 sprays into both nostrils daily. ?Patient taking differently: Place 2 sprays into both nostrils daily as needed for allergies. 02/15/16   Ward, Delice Bison, DO  ?furosemide (LASIX) 40 MG tablet TAKE ONE TABLET BY MOUTH ONCE EVERY DAY FOR BLOOD PRESSURE AND FOR FLUID CONTROL 03/18/21   [provider]  ?megestrol (MEGACE) 400 MG/10ML suspension Take 10 mLs (400 mg total) by mouth 2 (two) times daily. 03/20/21   Derek Jack, MD  ?pantoprazole (PROTONIX) 40 MG tablet Take 1 tablet (40 mg total) by mouth daily. 03/20/21   Derek Jack, MD  ?potassium chloride SA (KLOR-CON) 20 MEQ tablet Take 20 mEq by mouth daily. 02/28/20   [provider]  ?predniSONE (DELTASONE) 5 MG tablet Take 1 tablet (5 mg total) by mouth daily with breakfast. 01/15/21   Derek Jack, MD  ?rivaroxaban (  XARELTO) 20 MG TABS tablet Take 1 tablet (20 mg total) by mouth daily with supper. 10/30/13   Croitoru, Mihai, MD  ?tamsulosin (FLOMAX) 0.4 MG CAPS capsule Take 1 capsule (0.4 mg total) by mouth daily after supper. 05/07/21   McKenzie, Candee Furbish, MD  ?traMADol (ULTRAM) 50 MG tablet Take 1 tablet (50 mg total) by mouth every 6 (six) hours as needed. 05/24/21   Daleen Bo, MD  ? ? ? ?Allergies    ?Patient has no known allergies. ? ? ?Review of Systems   ?Review of Systems ?Please see HPI for pertinent positives and  negatives ? ?Physical Exam ?BP (!) 187/68   Pulse 89   Temp 98.5 ?F (36.9 ?C) (Oral)   Resp 16   Ht '6\' 1"'$  (1.854 m)   Wt 81.6 kg   SpO2 98%   BMI 23.75 kg/m?  ? ?Physical Exam ?Vitals and nursing note reviewed.  ?Constitutional:   ?   Appearance: Normal appearance.  ?HENT:  ?   Head: Normocephalic and atraumatic.  ?   Nose: Nose normal.  ?   Mouth/Throat:  ?   Mouth: Mucous membranes are moist.  ?Eyes:  ?   Extraocular Movements: Extraocular movements intact.  ?   Conjunctiva/sclera: Conjunctivae normal.  ?Cardiovascular:  ?   Rate and Rhythm: Normal rate.  ?Pulmonary:  ?   Effort: Pulmonary effort is normal.  ?   Breath sounds: Normal breath sounds.  ?Abdominal:  ?   General: Abdomen is flat.  ?   Palpations: Abdomen is soft.  ?   Tenderness: There is no abdominal tenderness.  ?Musculoskeletal:     ?   General: No swelling.  ?   Cervical back: Neck supple.  ?   Comments: R arm in sling  ?Skin: ?   General: Skin is warm and dry.  ?Neurological:  ?   General: No focal deficit present.  ?   Mental Status: He is alert and oriented to person, place, and time.  ?   Cranial Nerves: No cranial nerve deficit.  ?   Motor: No weakness.  ?Psychiatric:     ?   Mood and Affect: Mood normal.  ? ? ?ED Results / Procedures / Treatments   ?EKG ?EKG Interpretation ? ?Date/Time:  Sunday May 25 2021 23:31:14 EDT ?Ventricular Rate:  72 ?PR Interval:  269 ?QRS Duration: 99 ?QT Interval:  409 ?QTC Calculation: 448 ?R Axis:   21 ?Text Interpretation: Atrial-paced complexes Prolonged PR interval Probable anteroseptal infarct, recent Abnormal T, consider ischemia, diffuse leads Lateral leads are also involved Since last tracing no V paced? beats seen today Otherwise no significant change from baseline Confirmed by Calvert Cantor [phone]) on 05/25/2021 11:35:51 PM ? ?Procedures ?Procedures ? ?Medications Ordered in the ED ?Medications - No data to display ? ?Initial Impression and Plan ? Patient well appearing, nonspecific dizziness  has resolved and he is at baseline. He had labs done in triage with CBC, BMP at baseline. UA is neg for signs of infection. Will check EKG to ensure no new dysrhythmias since yesterday. If unchanged, patient and family at bedside would like to go home. Advised to continue home medications. Ensure patient has help at all times at home and is encouraged to use his walker when he is up and around. Return to the ED for any worsening including falls, loss of consciousness or head injury.  ? ?ED Course  ? ?  ? ? ?MDM Rules/Calculators/A&P ?Medical Decision Making ?Given presenting complaint, I  considered that admission might be necessary. After review of results from ED lab and/or imaging studies and discussion with patient/family, admission to the hospital is not indicated at this time.  ? ? ?Problems Addressed: ?Dizziness: chronic illness or injury with exacerbation, progression, or side effects of treatment ? ?Amount and/or Complexity of Data Reviewed ?Labs: ordered. Decision-making details documented in ED Course. ?ECG/medicine tests: ordered and independent interpretation performed. Decision-making details documented in ED Course. ? ?Risk ?Decision regarding hospitalization. ? ? ? ?Final Clinical Impression(s) / ED Diagnoses ?Final diagnoses:  ?Dizziness  ? ? ?Rx / DC Orders ?ED Discharge Orders   ? ? None  ? ?  ? ?  ?Truddie Hidden, MD ?05/25/21 2336 ? ?

## 2021-05-25 NOTE — ED Triage Notes (Signed)
Patient with complaints of dizziness that started today. Denies dizziness at present. Has had 4 episodes of this today and previous episodes in October.  ?

## 2021-05-25 NOTE — ED Notes (Signed)
Pt denies dizziness at the moment. Nad. With son ?

## 2021-05-26 ENCOUNTER — Inpatient Hospital Stay (HOSPITAL_COMMUNITY): Payer: Medicare Other | Attending: Hematology

## 2021-05-26 DIAGNOSIS — Z7901 Long term (current) use of anticoagulants: Secondary | ICD-10-CM | POA: Diagnosis not present

## 2021-05-26 DIAGNOSIS — Z87891 Personal history of nicotine dependence: Secondary | ICD-10-CM | POA: Insufficient documentation

## 2021-05-26 DIAGNOSIS — R0602 Shortness of breath: Secondary | ICD-10-CM | POA: Insufficient documentation

## 2021-05-26 DIAGNOSIS — R42 Dizziness and giddiness: Secondary | ICD-10-CM | POA: Diagnosis not present

## 2021-05-26 DIAGNOSIS — Z79899 Other long term (current) drug therapy: Secondary | ICD-10-CM | POA: Diagnosis not present

## 2021-05-26 DIAGNOSIS — C61 Malignant neoplasm of prostate: Secondary | ICD-10-CM | POA: Insufficient documentation

## 2021-05-26 DIAGNOSIS — Z9049 Acquired absence of other specified parts of digestive tract: Secondary | ICD-10-CM | POA: Insufficient documentation

## 2021-05-26 DIAGNOSIS — C642 Malignant neoplasm of left kidney, except renal pelvis: Secondary | ICD-10-CM | POA: Diagnosis present

## 2021-05-26 DIAGNOSIS — C7971 Secondary malignant neoplasm of right adrenal gland: Secondary | ICD-10-CM | POA: Diagnosis not present

## 2021-05-26 DIAGNOSIS — Z85038 Personal history of other malignant neoplasm of large intestine: Secondary | ICD-10-CM | POA: Insufficient documentation

## 2021-05-26 DIAGNOSIS — M25511 Pain in right shoulder: Secondary | ICD-10-CM | POA: Diagnosis not present

## 2021-05-26 DIAGNOSIS — C641 Malignant neoplasm of right kidney, except renal pelvis: Secondary | ICD-10-CM | POA: Diagnosis present

## 2021-05-26 DIAGNOSIS — D508 Other iron deficiency anemias: Secondary | ICD-10-CM

## 2021-05-26 LAB — COMPREHENSIVE METABOLIC PANEL
ALT: 14 U/L (ref 0–44)
AST: 17 U/L (ref 15–41)
Albumin: 3.2 g/dL — ABNORMAL LOW (ref 3.5–5.0)
Alkaline Phosphatase: 52 U/L (ref 38–126)
Anion gap: 5 (ref 5–15)
BUN: 25 mg/dL — ABNORMAL HIGH (ref 8–23)
CO2: 21 mmol/L — ABNORMAL LOW (ref 22–32)
Calcium: 9.1 mg/dL (ref 8.9–10.3)
Chloride: 108 mmol/L (ref 98–111)
Creatinine, Ser: 1.27 mg/dL — ABNORMAL HIGH (ref 0.61–1.24)
GFR, Estimated: 53 mL/min — ABNORMAL LOW (ref >60)
Glucose, Bld: 107 mg/dL — ABNORMAL HIGH (ref 70–99)
Potassium: 4 mmol/L (ref 3.5–5.1)
Sodium: 134 mmol/L — ABNORMAL LOW (ref 135–145)
Total Bilirubin: 0.9 mg/dL (ref 0.3–1.2)
Total Protein: 6.5 g/dL (ref 6.5–8.1)

## 2021-05-26 LAB — CBC WITH DIFFERENTIAL/PLATELET
Abs Immature Granulocytes: 0.03 10*3/uL (ref 0.00–0.07)
Basophils Absolute: 0 10*3/uL (ref 0.0–0.1)
Basophils Relative: 0 %
Eosinophils Absolute: 0 10*3/uL (ref 0.0–0.5)
Eosinophils Relative: 0 %
HCT: 30.8 % — ABNORMAL LOW (ref 39.0–52.0)
Hemoglobin: 10.9 g/dL — ABNORMAL LOW (ref 13.0–17.0)
Immature Granulocytes: 1 %
Lymphocytes Relative: 17 %
Lymphs Abs: 1 10*3/uL (ref 0.7–4.0)
MCH: 36.7 pg — ABNORMAL HIGH (ref 26.0–34.0)
MCHC: 35.4 g/dL (ref 30.0–36.0)
MCV: 103.7 fL — ABNORMAL HIGH (ref 80.0–100.0)
Monocytes Absolute: 0.5 10*3/uL (ref 0.1–1.0)
Monocytes Relative: 9 %
Neutro Abs: 4.2 10*3/uL (ref 1.7–7.7)
Neutrophils Relative %: 73 %
Platelets: 247 10*3/uL (ref 150–400)
RBC: 2.97 MIL/uL — ABNORMAL LOW (ref 4.22–5.81)
RDW: 13.1 % (ref 11.5–15.5)
WBC: 5.7 10*3/uL (ref 4.0–10.5)
nRBC: 0 % (ref 0.0–0.2)

## 2021-05-26 LAB — PSA: Prostatic Specific Antigen: 5.97 ng/mL — ABNORMAL HIGH (ref 0.00–4.00)

## 2021-05-26 LAB — MAGNESIUM: Magnesium: 1.3 mg/dL — ABNORMAL LOW (ref 1.7–2.4)

## 2021-05-27 ENCOUNTER — Other Ambulatory Visit (HOSPITAL_COMMUNITY): Payer: Self-pay | Admitting: Hematology

## 2021-05-27 ENCOUNTER — Telehealth: Payer: Self-pay

## 2021-05-27 NOTE — Telephone Encounter (Signed)
-----   Message from Reynaldo Minium, [place] sent at 05/27/2021  8:48 AM EDT ----- ?He needs to continue the antibx even though cx is negative. I suspect he has prostatitis ?----- Message ----- ?From: Interface, [facility] Lab Results In ?Sent: 05/22/2021  10:53 AM EDT ?To: Berneice Heinrich [place], PA-C ? ? ?

## 2021-05-27 NOTE — Telephone Encounter (Signed)
Daughter called and notified to continue antibiotic as prescribed. ? ? ?

## 2021-05-29 ENCOUNTER — Ambulatory Visit: Payer: No Typology Code available for payment source | Admitting: Physician Assistant

## 2021-06-02 ENCOUNTER — Ambulatory Visit (INDEPENDENT_AMBULATORY_CARE_PROVIDER_SITE_OTHER): Payer: Medicare Other | Admitting: Physician Assistant

## 2021-06-02 ENCOUNTER — Inpatient Hospital Stay (HOSPITAL_BASED_OUTPATIENT_CLINIC_OR_DEPARTMENT_OTHER): Payer: Medicare Other | Admitting: Hematology

## 2021-06-02 ENCOUNTER — Other Ambulatory Visit (HOSPITAL_COMMUNITY): Payer: Self-pay

## 2021-06-02 VITALS — BP 116/70 | HR 99

## 2021-06-02 VITALS — BP 156/99 | HR 92 | Temp 98.4°F | Resp 18 | Ht 73.0 in | Wt 185.9 lb

## 2021-06-02 DIAGNOSIS — N138 Other obstructive and reflux uropathy: Secondary | ICD-10-CM

## 2021-06-02 DIAGNOSIS — C61 Malignant neoplasm of prostate: Secondary | ICD-10-CM | POA: Diagnosis not present

## 2021-06-02 DIAGNOSIS — D508 Other iron deficiency anemias: Secondary | ICD-10-CM

## 2021-06-02 DIAGNOSIS — N401 Enlarged prostate with lower urinary tract symptoms: Secondary | ICD-10-CM

## 2021-06-02 DIAGNOSIS — R339 Retention of urine, unspecified: Secondary | ICD-10-CM

## 2021-06-02 LAB — BLADDER SCAN AMB NON-IMAGING: Scan Result: 264

## 2021-06-02 MED ORDER — MAGNESIUM OXIDE -MG SUPPLEMENT 400 (240 MG) MG PO TABS: 400.0000 mg | ORAL_TABLET | Freq: Three times a day (TID) | ORAL | 3 refills | Status: DC

## 2021-06-02 MED ORDER — NUBEQA 300 MG PO TABS
600.0000 mg | ORAL_TABLET | Freq: Two times a day (BID) | ORAL | 0 refills | Status: DC
Start: 2021-06-02 — End: 2021-06-10
  Filled 2021-06-02: qty 120, 30d supply, fill #0

## 2021-06-02 NOTE — Progress Notes (Signed)
Pvr 264 ?

## 2021-06-02 NOTE — Progress Notes (Signed)
? ?[facility] ?[address] ?[place], [place] [address] ? ? ?CLINIC:  ?Medical Oncology/Hematology ? ?PCP:  ?Center, [facility] ?[address] [place] [address] ?[phone] ? ? 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  ?PRIOR THERAPY: none ? ?NGS Results: not done ? ?CURRENT THERAPY: Lupron every 6 months ? ?BRIEF ONCOLOGIC HISTORY:  ?Oncology History  ? No history exists.  ? ? ?CANCER STAGING: ? Cancer Staging  ?Prostate cancer ([place]) ?Staging form: Prostate, AJCC 8th Edition ?- Clinical stage from 07/10/2020: Stage IVB (cTX, cNX, pM1c, PSA: 10.2) - Unsigned ? ? ?INTERVAL HISTORY:  ?[name], a 88 y.o. 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 Derrick Long was last seen on 04/17/2021.  ? ?Today he reports feeling good, and he is accompanied by his daughter. His daughter reports he had 1 fall 1 month ago. She denies he has had any recent UTIs. He is eating well. He denies back pain. He takes tramadol prn for right shoulder pain. His daughter is monitoring his BP at home. He reports he cannot recall if he has had CP within the past 3 weeks.  ? ?REVIEW OF SYSTEMS:  ?Review of Systems  ?Constitutional:  Negative for appetite change and fatigue.  ?Respiratory:  Positive for shortness of breath.   ?Musculoskeletal:  Positive for arthralgias (5/10 R shoulder). Negative for back pain.  ?Neurological:  Positive for dizziness.  ?All other systems reviewed and are negative. ? ?PAST MEDICAL/SURGICAL HISTORY:  ?Past Medical History:  ?Diagnosis Date  ? Acid reflux   ? Anemia   ? Colon cancer ([place])   ? per patient, colon cancer more than 10 years ago s/p right hemicolectomy  ? Ex-cigarette smoker   ? Gout   ? Hyperlipemia   ? Hypertension   ? Paroxysmal atrial fibrillation ([place]) 2014  ? Prostate cancer ([place]) 08/08/2013  ? SEEDS 20 YEARS AGO  ? Prostate disease   ? ?Past  Surgical History:  ?Procedure Laterality Date  ? BIOPSY  02/05/2021  ? Procedure: BIOPSY;  Surgeon: Harvel Quale, MD;  Location: [facility] ENDO SUITE;  Service: Gastroenterology;;  ? COLON SURGERY    ? Right hemicolectomy in the setting of colon cancer, per patient  ? CYSTOSCOPY WITH INSERTION OF UROLIFT N/A 05/19/2021  ? Procedure: CYSTOSCOPY WITH INSERTION OF UROLIFT;  Surgeon: Cleon Gustin, MD;  Location: [facility] ORS;  Service: Urology;  Laterality: N/A;  ? ESOPHAGOGASTRODUODENOSCOPY (EGD) WITH PROPOFOL N/A 02/05/2021  ? Procedure: ESOPHAGOGASTRODUODENOSCOPY (EGD) WITH PROPOFOL;  Surgeon: Harvel Quale, MD;  Location: [facility] ENDO SUITE;  Service: Gastroenterology;  Laterality: N/A;  ? LEFT HEART CATHETERIZATION WITH CORONARY ANGIOGRAM N/A 07/21/2012  ? Procedure: LEFT HEART CATHETERIZATION WITH CORONARY ANGIOGRAM;  Surgeon: Troy Sine, MD;  Location: [facility] CATH LAB;  Service: Cardiovascular;  Laterality: N/A;  ? PACEMAKER INSERTION    ? PERMANENT PACEMAKER INSERTION N/A 07/22/2012  ? Procedure: PERMANENT PACEMAKER INSERTION;  Surgeon: Sanda Klein, MD;  Location: [facility] CATH LAB;  Service: Cardiovascular;  Laterality: N/A;  ? PROSTATE SURGERY    ? ? ?SOCIAL HISTORY:  ?Social History  ? ?Socioeconomic History  ? Marital status: Married  ?  Spouse name: Not on file  ? Number of children: Not on file  ? Years of education: Not on file  ? Highest education level: Not on file  ?Occupational History  ? Not on  file  ?Tobacco Use  ? Smoking status: Former  ?  Types: Cigarettes  ?  Quit date: 07/20/2002  ?  Years since quitting: 18.8  ? Smokeless tobacco: Never  ?Vaping Use  ? Vaping Use: Never used  ?Substance and Sexual Activity  ? Alcohol use: No  ? Drug use: No  ? Sexual activity: Not on file  ?Other Topics Concern  ? Not on file  ?Social History Narrative  ? Not on file  ? ?Social Determinants of Health  ? ?Financial Resource Strain: Not on file  ?Food Insecurity: Not on file  ?Transportation Needs: Not on  file  ?Physical Activity: Not on file  ?Stress: Not on file  ?Social Connections: Not on file  ?Intimate Partner Violence: Not on file  ? ? ?FAMILY HISTORY:  ?Family History  ?Problem Relation Age of Onset  ? Cancer Neg Hx   ? ? ?CURRENT MEDICATIONS:  ?Current Outpatient Medications  ?Medication Sig Dispense Refill  ? abiraterone acetate (ZYTIGA) 250 MG tablet Take 2 tablets (500 mg total) by mouth daily. Take on an empty stomach 1 hour before or 2 hours after a meal 120 tablet 3  ? acetaminophen (TYLENOL) 650 MG CR tablet Take 1,300 mg by mouth every 8 (eight) hours as needed for pain.    ? ascorbic acid (VITAMIN C) 250 MG tablet TAKE ONE TABLET BY MOUTH THREE TIMES A DAY WITH MEALS - TAKE WITH IRON TO PROMOTE BETTER ABSORPTION    ? atorvastatin (LIPITOR) 20 MG tablet Take 1 tablet (20 mg total) by mouth daily at 6 PM. 30 tablet 5  ? carvedilol (COREG) 3.125 MG tablet Take 1 tablet (3.125 mg total) by mouth 2 (two) times daily. 60 tablet 0  ? Cholecalciferol 25 MCG (1000 UT) tablet Take 1,000 Units by mouth daily.    ? clotrimazole-betamethasone (LOTRISONE) cream Apply 1 application. topically 2 (two) times daily. 30 g 0  ? diclofenac sodium (VOLTAREN) 1 % GEL Apply 2 g topically 4 (four) times daily. (Patient taking differently: Apply 2 g topically 4 (four) times daily as needed (pain).) 100 g 0  ? doxycycline (VIBRAMYCIN) 100 MG capsule Take 1 capsule (100 mg total) by mouth every 12 (twelve) hours. 14 capsule 0  ? Ensure (ENSURE) Take 237 mLs by mouth daily as needed (nutrition). Vanilla or chocolate    ? ferrous sulfate 324 MG TBEC Take 324 mg by mouth 2 (two) times daily.    ? fluticasone (FLONASE) 50 MCG/ACT nasal spray Place 2 sprays into both nostrils daily. (Patient taking differently: Place 2 sprays into both nostrils daily as needed for allergies.) 16 g 2  ? furosemide (LASIX) 40 MG tablet TAKE ONE TABLET BY MOUTH ONCE EVERY DAY FOR BLOOD PRESSURE AND FOR FLUID CONTROL    ? megestrol (MEGACE) 400 MG/10ML  suspension Take 10 mLs (400 mg total) by mouth 2 (two) times daily. 480 mL 2  ? pantoprazole (PROTONIX) 40 MG tablet TAKE 1 TABLET BY MOUTH ONCE DAILY. 30 tablet 0  ? potassium chloride SA (KLOR-CON) 20 MEQ tablet Take 20 mEq by mouth daily.    ? predniSONE (DELTASONE) 5 MG tablet Take 1 tablet (5 mg total) by mouth daily with breakfast. 30 tablet 6  ? rivaroxaban (XARELTO) 20 MG TABS tablet Take 1 tablet (20 mg total) by mouth daily with supper. 20 tablet 0  ? tamsulosin (FLOMAX) 0.4 MG CAPS capsule Take 1 capsule (0.4 mg total) by mouth daily after supper. 30 capsule 11  ?  traMADol (ULTRAM) 50 MG tablet Take 1 tablet (50 mg total) by mouth every 6 (six) hours as needed. 20 tablet 0  ? ?No current facility-administered medications for this visit.  ? ? ?ALLERGIES:  ?No Known Allergies ? ?PHYSICAL EXAM:  ?Performance status (ECOG): 1 - Symptomatic but completely ambulatory ? ?Vitals:  ? 06/02/21 1319  ?BP: (!) 156/99  ?Pulse: 92  ?Resp: 18  ?Temp: 98.4 ?F (36.9 ?C)  ?SpO2: 95%  ? ?Wt Readings from Last 3 Encounters:  ?06/02/21 185 lb 14.4 oz (84.3 kg)  ?05/25/21 180 lb (81.6 kg)  ?05/24/21 184 lb 1.4 oz (83.5 kg)  ? ?Physical Exam ?Vitals reviewed.  ?Constitutional:   ?   Appearance: Normal appearance.  ?   Comments: In wheelchair  ?Cardiovascular:  ?   Rate and Rhythm: Normal rate and regular rhythm.  ?   Pulses: Normal pulses.  ?   Heart sounds: Normal heart sounds.  ?Pulmonary:  ?   Effort: Pulmonary effort is normal.  ?   Breath sounds: Normal breath sounds.  ?Neurological:  ?   General: No focal deficit present.  ?   Mental Status: He is alert and oriented to person, place, and time.  ?Psychiatric:     ?   Mood and Affect: Mood normal.     ?   Behavior: Behavior normal.  ?  ? ?LABORATORY DATA:  ?I have reviewed the labs as listed.  ? ?  Latest Ref Rng & Units 05/26/2021  ?  1:54 PM 05/25/2021  ?  6:51 PM 04/14/2021  ? 10:44 AM  ?CBC  ?WBC 4.0 - 10.5 K/uL 5.7   7.5   5.9    ?Hemoglobin 13.0 - 17.0 g/dL 10.9   11.3    11.3    ?Hematocrit 39.0 - 52.0 % 30.8   34.2   32.8    ?Platelets 150 - 400 K/uL 247   264   199    ? ? ?  Latest Ref Rng & Units 05/26/2021  ?  1:54 PM 05/25/2021  ?  6:51 PM 04/14/2021  ? 10:44 AM  ?CMP

## 2021-06-02 NOTE — Progress Notes (Signed)
? ?Assessment: ?1. Urinary retention   ?2. Prostate cancer ([place])   ?3. Benign prostatic hyperplasia with urinary obstruction   ? ? ?Plan: ?FU in 6-8 weeks for PVR and UA, continue Flomax daily. Discussed SE s at length and advised he remain on walker at all times rather than his cane. ? ?Chief Complaint: ?No chief complaint on file. ? ? ?HPI: ?Derrick Long is a 88 y.o. male with h/o metastatic prostate CA and BPH with obstruction who presents with his daughters for continued evaluation of urinary retention and UTI post Urolift. DOS: 05/19/21 He denies c/o today and reports that he is satisfied with his urine flow. He continues Flomax once daily. No dysuria, gross hematuria. Pt was seen in the ED on 05/25/21 with c/o dizziness. Workup was negative. ?UA= 2+protein, no WBC, nitrite negative ?PVR= 264 ?Urine cx on 05/22/21=no growth ?PSA 5.97 on 05/26/21-ordered by oncology. 3.98 one month prior ? ? ?05/23/21 ?Pt doing well post voiding trial. Advised to continue Rapaflo HS and Doxy as Rx for previous UTI. ? ?05/21/21 ?Derrick Long is a 88yo here for followup for urinary retention. He underwent Urolift 2 day ago and foley was removed today. He was able to urinate multiple times since the foley was removed. No dysuria. No urinary frequency or urgency. No other complaints today ? ?Portions of the above documentation were copied from a prior visit for review purposes only. ? ?Allergies: ?No Known Allergies ? ?PMH: ?Past Medical History:  ?Diagnosis Date  ? Acid reflux   ? Anemia   ? Colon cancer ([place])   ? per patient, colon cancer more than 10 years ago s/p right hemicolectomy  ? Ex-cigarette smoker   ? Gout   ? Hyperlipemia   ? Hypertension   ? Paroxysmal atrial fibrillation ([place]) 2014  ? Prostate cancer ([place]) 08/08/2013  ? SEEDS 20 YEARS AGO  ? Prostate disease   ? ? ?PSH: ?Past Surgical History:  ?Procedure Laterality Date  ? BIOPSY  02/05/2021  ? Procedure: BIOPSY;  Surgeon: Harvel Quale, MD;  Location: [facility] ENDO  SU[facility];  Service: Gastroenterology;;  ? COLON SURGERY    ? Right hemicolectomy in the setting of colon cancer, per patient  ? CYSTOSCOPY WITH INSERTION OF UROLIFT N/A 05/19/2021  ? Procedure: CYSTOSCOPY WITH INSERTION OF UROLIFT;  Surgeon: Cleon Gustin, MD;  Location: [facility] ORS;  Service: Urology;  Laterality: N/A;  ? ESOPHAGOGASTRODUODENOSCOPY (EGD) WITH PROPOFOL N/A 02/05/2021  ? Procedure: ESOPHAGOGASTRODUODENOSCOPY (EGD) WITH PROPOFOL;  Surgeon: Harvel Quale, MD;  Location: [facility];  Service: Gastroenterology;  Laterality: N/A;  ? LEFT HEART CATHETERIZATION WITH CORONARY ANGIOGRAM N/A 07/21/2012  ? Procedure: LEFT HEART CATHETERIZATION WITH CORONARY ANGIOGRAM;  Surgeon: Troy Sine, MD;  Location: [facility] CATH LAB;  Service: Cardiovascular;  Laterality: N/A;  ? PACEMAKER INSERTION    ? PERMANENT PACEMAKER INSERTION N/A 07/22/2012  ? Procedure: PERMANENT PACEMAKER INSERTION;  Surgeon: Sanda Klein, MD;  Location: [facility] CATH LAB;  Service: Cardiovascular;  Laterality: N/A;  ? PROSTATE SURGERY    ? ? ?SH: ?Social History  ? ?Tobacco Use  ? Smoking status: Former  ?  Types: Cigarettes  ?  Quit date: 07/20/2002  ?  Years since quitting: 18.8  ? Smokeless tobacco: Never  ?Vaping Use  ? Vaping Use: Never used  ?Substance Use Topics  ? Alcohol use: No  ? Drug use: No  ? ? ?ROS: ?Constitutional:  Negative for fever, chills, weight loss ?CV: Negative for chest pain ?Respiratory:  Negative for shortness of breath, wheezing, sleep apnea, frequent cough ?GI:  Negative for nausea, vomiting, bloody stool, GERD ? ?PE: ?BP 116/70   Pulse 99  ?GENERAL APPEARANCE:  Well appearing, well developed, well nourished, NAD ?HEENT:  Atraumatic, normocephalic ?NECK:  Supple. Trachea midline ?ABDOMEN:  Soft, non-tender, no masses ?EXTREMITIES:  Sling noted RU extremity ?NEUROLOGIC:  Alert and oriented x 3, ambulates with shuffle using walker today ?MENTAL STATUS:  appropriate ?BACK:  Non-tender to palpation, No CVAT ?SKIN:   Warm, dry, and intact ? ? ?Results: ?Laboratory Data: ?Lab Results  ?Component Value Date  ? WBC 5.7 05/26/2021  ? HGB 10.9 (L) 05/26/2021  ? HCT 30.8 (L) 05/26/2021  ? MCV 103.7 (H) 05/26/2021  ? PLT 247 05/26/2021  ? ? ?Lab Results  ?Component Value Date  ? CREATININE 1.27 (H) 05/26/2021  ? ? ?Lab Results  ?Component Value Date  ? PSA Refused 12/22/2007  ? PSA 0.07 (L) 06/24/2007  ? PSA 0.10 05/25/2006  ? ? ?No results found for: TESTOSTERONE ? ?Lab Results  ?Component Value Date  ? HGBA1C 5.6 07/19/2012  ? ? ?Urinalysis ?   ?Component Value Date/Time  ? [place] YELLOW 05/25/2021 2016  ? APPEARANCEUR CLEAR 05/25/2021 2016  ? APPEARANCEUR [place] (A) 05/22/2021 0920  ? LABSPEC 1.012 05/25/2021 2016  ? PHURINE 5.0 05/25/2021 2016  ? [place] NEGATIVE 05/25/2021 2016  ? HGBUR MODERATE (A) 05/25/2021 2016  ? HGBUR negative 06/24/2007 0833  ? [place] NEGATIVE 05/25/2021 2016  ? BILIRUBINUR Negative 05/22/2021 0920  ? [place] NEGATIVE 05/25/2021 2016  ? PROTEINUR 100 (A) 05/25/2021 2016  ? UROBILINOGEN 1.0 07/29/2013 2032  ? NITRITE NEGATIVE 05/25/2021 2016  ? LEUKOCYTESUR NEGATIVE 05/25/2021 2016  ? ? ?Lab Results  ?Component Value Date  ? LABMICR See below: 05/22/2021  ? WBCUA 0-5 05/22/2021  ? LABEPIT None seen 05/22/2021  ? MUCUS Present 05/22/2021  ? BACTERIA RARE (A) 05/25/2021  ? ? ?Pertinent Imaging: ? ?Results for orders placed in visit on 07/29/01 ? ?DG Abd 1 View ? ?Narrative ?FINDINGS ?CLINICAL DATA:  PROSTATE CA. ?SINGLE VIEW ABDOMEN ?A NORMAL BOWEL GAS PATTERN IS DEMONSTRATED.  MILD TO MODERATE SPUR FORMATION IS DEMONSTRATED AT ?MULTIPLE LEVELS OF THE LUMBAR SPINE AS WELL AS MINIMAL LEVOCONVEX LUMBAR ROTARY SCOLIOSIS.  ALSO ?NOTED IS BRIDGING OSSIFICATION OR CALCIFICATION BETWEEN THE LOWER LUMBAR SPINE AND LEFT ILIAC WING. ?A TRANSITIONAL LUMBOSACRAL VERTEBRA IS ALSO DEMONSTRATED.  MILD ATHEROMATOUS ARTERIAL ?CALCIFICATIONS ARE NOTED. ?IMPRESSION ?NO ACUTE ABNORMALITY AND NO EVIDENCE OF BONY  METASTATIC DISEASE. ? ? ?Results for orders placed during the hospital encounter of 08/01/13 ? ?US Renal ? ?Narrative ?CLINICAL DATA:  Acute urinary retention ? ?EXAM: ?RENAL/URINARY TRACT ULTRASOUND COMPLETE ? ?COMPARISON:  04/14/2004 ? ?FINDINGS: ?Right Kidney: ? ?Length: 10.2 cm. Complex 2.0 x 1.8 x 1.6 cm echogenic lesion, right ?kidney lower pole. We were not able the demonstrate definite blood ?flow within this lesion. Hypoechoic but minimally complex 2.6 x 2.2 ?x 1.9 cm lesion, right kidney lower pole. ? ?Left Kidney: ? ?Length: 1.3 cm. Mild prominence of renal sinus adipose tissue. Tiny ?probable cyst, left mid-upper kidney, 4 mm. Equivocal small mid ?kidney nonobstructive calculus. ? ?Bladder: ? ?Nondistended.  Catheter in urinary bladder. ? ?IMPRESSION: ?1. Complex 2 cm echogenic lesion, right kidney lower pole, in ?vicinity of prior enhancing nodule. In 2006 this lesion measured 1.4 ?cm. This may represent slow growth of a small renal cell carcinoma. ?2. Minimally complex 2.6 cm right kidney lower pole lesion, most ?likely a complex  cyst although technically nonspecific. ?3. Equivocal small left mid kidney nonobstructive calculus. Mild ?renal sinus adipose tissue prominence on the left. ? ? ?Electronically Signed ?By: Sherryl Barters M.D. ?On: 08/01/2013 15:54 ? ?No results found for this or any previous visit. ? ?No results found for this or any previous visit. ? ?Results for orders placed during the hospital encounter of 02/19/21 ? ?CT Renal Stone Study ? ?Narrative ?CLINICAL DATA:  Urinary retention.  History of prostate cancer. ? ?EXAM: ?CT ABDOMEN AND PELVIS WITHOUT CONTRAST ? ?TECHNIQUE: ?Multidetector CT imaging of the abdomen and pelvis was performed ?following the standard protocol without IV contrast. ? ?COMPARISON:  02/03/2021. ? ?FINDINGS: ?Lower chest: The heart is normal in size and scattered coronary ?artery calcifications are noted. Pacemaker leads are present in the ?heart. A 4 mm nodule  is present in the right lower lobe, axial image ?8. ? ?Hepatobiliary: The liver is within normal limits. Mild intrahepatic ?and extrahepatic biliary ductal dilatation is seen which is likely ?due to patient's p

## 2021-06-02 NOTE — Patient Instructions (Addendum)
[place] at [facility] ?Discharge Instructions ? ? ?You were seen and examined today by Dr. Delton Coombes. ? ?He reviewed your lab work. Your PSA has gone up and magnesium is severely low. We sent a prescription for magnesium to your pharmacy.  ? ?We will switch you from Zytiga to a different pill to help control your prostate cancer. This pill is called Nubeqa.  You will take 300 mg twice a day for two weeks and then increase to 600 mg twice a day.  ? ?Let your primary care doctor know when you see him that you have been experiencing off and on chest pain and shortness of breath.  ? ? ? ? ?Thank you for choosing [place] at [facility] to provide your oncology and hematology care.  To afford each patient quality time with our provider, please arrive at least 15 minutes before your scheduled appointment time.  ? ?If you have a lab appointment with the [facility] please come in thru the Main Entrance and check in at the main information desk. ? ?You need to re-schedule your appointment should you arrive 10 or more minutes late.  We strive to give you quality time with our providers, and arriving late affects you and other patients whose appointments are after yours.  Also, if you no show three or more times for appointments you may be dismissed from the clinic at the providers discretion.     ?Again, thank you for choosing [facility].  Our hope is that these requests will decrease the amount of time that you wait before being seen by our physicians.       ?_____________________________________________________________ ? ?Should you have questions after your visit to [facility], please contact our office at [phone] and follow the prompts.  Our office hours are 8:00 a.m. and 4:30 p.m. Monday - Friday.  Please note that voicemails left after 4:00 p.m. may not be returned until the following business day.  We are closed weekends and  major holidays.  You do have access to a nurse 24-7, just call the main number to the clinic [phone] and do not press any options, hold on the line and a nurse will answer the phone.   ? ?For prescription refill requests, have your pharmacy contact our office and allow 72 hours.   ? ?Due to Covid, you will need to wear a mask upon entering the hospital. If you do not have a mask, a mask will be given to you at the Main Entrance upon arrival. For doctor visits, patients may have 1 support person age 69 or older with them. For treatment visits, patients can not have anyone with them due to social distancing guidelines and our immunocompromised population.  ? ?   ?

## 2021-06-03 ENCOUNTER — Telehealth (HOSPITAL_COMMUNITY): Payer: Self-pay | Admitting: Pharmacy Technician

## 2021-06-03 LAB — URINALYSIS, ROUTINE W REFLEX MICROSCOPIC
Bilirubin, UA: NEGATIVE
Glucose, UA: NEGATIVE
Ketones, UA: NEGATIVE
Leukocytes,UA: NEGATIVE
Nitrite, UA: NEGATIVE
Specific Gravity, UA: 1.015 (ref 1.005–1.030)
Urobilinogen, Ur: 1 mg/dL (ref 0.2–1.0)
pH, UA: 6.5 (ref 5.0–7.5)

## 2021-06-03 LAB — MICROSCOPIC EXAMINATION
Bacteria, UA: NONE SEEN
Epithelial Cells (non renal): NONE SEEN /hpf (ref 0–10)

## 2021-06-04 ENCOUNTER — Other Ambulatory Visit (HOSPITAL_COMMUNITY): Payer: Self-pay

## 2021-06-05 ENCOUNTER — Other Ambulatory Visit (HOSPITAL_COMMUNITY): Payer: Self-pay

## 2021-06-09 ENCOUNTER — Ambulatory Visit: Payer: No Typology Code available for payment source | Admitting: Surgical

## 2021-06-10 ENCOUNTER — Other Ambulatory Visit (HOSPITAL_COMMUNITY): Payer: Self-pay

## 2021-06-10 ENCOUNTER — Telehealth (HOSPITAL_COMMUNITY): Payer: Self-pay

## 2021-06-10 ENCOUNTER — Telehealth (HOSPITAL_COMMUNITY): Payer: Self-pay | Admitting: Pharmacist

## 2021-06-10 DIAGNOSIS — C61 Malignant neoplasm of prostate: Secondary | ICD-10-CM

## 2021-06-10 MED ORDER — NUBEQA 300 MG PO TABS
ORAL_TABLET | ORAL | 0 refills | Status: DC
  Filled 2021-06-10: qty 90, 30d supply, fill #0
  Filled 2021-06-10: qty 120, fill #0

## 2021-06-10 NOTE — Telephone Encounter (Signed)
Oral Oncology Patient Advocate Encounter ?  ?Received notification from [employer] that prior authorization for Nubeqa is required. ?  ?PA submitted on CoverMyMeds on 06/03/21. ?Key [id number] ?Status is pending ?  ?Oral Oncology Clinic will continue to follow. ? ?Dennison Nancy CPHT ?Specialty Pharmacy Patient Advocate ?[place] ?Phone [phone] ?Fax [phone] ? ?

## 2021-06-10 NOTE — Telephone Encounter (Signed)
Oral Oncology Patient Advocate Encounter ? 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   ?  ?This determination is currently being appealed.  The appeal packet was faxed to [phone] on 06/04/21.  ? ?This encounter will continue to be updated until final determination.   ? ?Dennison Nancy CPHT ?Specialty Pharmacy Patient Advocate ?[place] ?Phone [phone] ?Fax [phone] ? ?

## 2021-06-10 NOTE — Telephone Encounter (Signed)
Encounter opened in error

## 2021-06-10 NOTE — Telephone Encounter (Signed)
Oral Oncology Patient Advocate Encounter ? ?I spoke with patients daughter, Gwinda [place], this afternoon to set up delivery of Nubeqa.  Address verified for shipment.  Norville Haggard will be filled through [facility] and mailed 4/26 for delivery 4/27.   ? ?[place] will call 7-10 days before next refill is due to complete adherence call and set up delivery of medication.    ? ?Dennison Nancy CPHT ?Specialty Pharmacy Patient Advocate ?[place] ?Phone [phone] ?Fax [phone] ?06/10/2021 3:37 PM ? ? ?

## 2021-06-10 NOTE — Telephone Encounter (Signed)
Oral Chemotherapy Pharmacy Student Encounter ? ?Patient Education ?I spoke with caregiver (patient's daughter) Derrick Long for overview of patient's new oral chemotherapy medication: darolutammide [place] Haggard) for the treatment of metastatic prostate cancer to the right adrenal gland in conjunction with ADT, planned duration until disease progression or unacceptable toxicity. ? ?Pt is doing well. Counseled patient's daughter on administration, dosing, side effects, monitoring, drug-food interactions, safe handling, storage, and disposal. ?Patient will take 1 tablet (300 mg) by mouth twice daily for 2 weeks, then increase to 2 tablets (600 mg total) by mouth twice daily. Take with food. ? ?Side effects include but not limited to: changes in liver function, decreased WBC. ?Decreased WBC: educated patient's daughter on the increased risk for infection. Advised her to call office if patient experiences flu-like symptoms including fever, chills, sore throat. ? ?Reviewed with patient's daughter importance of keeping a medication schedule and plan for any missed doses. ? ?After discussion with patient's daughter no patient barriers to medication adherence identified.  ? ?Patient's daughter Ms. Derrick Long voiced understanding and appreciation. All questions answered. Medication handout provided. ? ?Provided patient's daughter with Oral [facility] phone number. Patient's daughter knows to call the office with questions or concerns. Oral Chemotherapy Navigation Clinic will continue to follow. ? ?Adele Dan, PharmD Candidate ?Class of 2023 ?[place]/[facility] Oral Chemotherapy Navigation Clinic ?[phone] ? ?06/10/2021 2:17 PM  ?

## 2021-06-10 NOTE — Telephone Encounter (Signed)
Oral Oncology Pharmacy Student Encounter ? 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 ? ?CBC/CMP from 05/26/21 assessed, SCr is elevated but improved, no dose adjustment is needed.  Chronic anemia is noted, will continue close monitoring. Prescription dose and frequency assessed.  ? ?Current medication list in Epic reviewed, daralutamide may increase the serum concentration of atorvastatin that patient is taking, but no baseline dose adjustment is needed. ? ?Evaluated chart and no patient barriers to medication adherence identified.  ? ?Prescription has been e-scribed to the [facility] for benefits analysis and approval. ? ?Oral Oncology Clinic will continue to follow for insurance authorization, copayment issues, initial counseling and start date. ? ?Patient agreed to treatment on 06/02/21 per MD documentation. ? ?Adele Dan, PharmD Candidate ?Class of 2023 ?[place]/[facility] Oral Chemotherapy Navigation Clinic ?[phone] ? ?06/10/2021 12:51 PM  ?

## 2021-06-10 NOTE — Telephone Encounter (Signed)
Oral Oncology Patient Advocate Encounter ? ?Called to check the status of [place] appeal determination and PA denial has been overturned. ? ?PA# [id number] ?Effective dates: 06/02/21 through 02/15/22 ? ?Patients co-pay is $2830.14. Patient has a grant to cover this copay. ? ?Oral Oncology Clinic will continue to follow.  ?   ?Dennison Nancy CPHT ?Specialty Pharmacy Patient Advocate ?[place] ?Phone [phone] ?Fax [phone] ?06/10/2021 12:26 PM ? ?

## 2021-06-11 ENCOUNTER — Other Ambulatory Visit (HOSPITAL_COMMUNITY): Payer: Self-pay

## 2021-06-20 ENCOUNTER — Ambulatory Visit: Payer: No Typology Code available for payment source | Admitting: Surgical

## 2021-06-23 ENCOUNTER — Other Ambulatory Visit (HOSPITAL_COMMUNITY): Payer: Self-pay

## 2021-06-30 ENCOUNTER — Other Ambulatory Visit (HOSPITAL_COMMUNITY): Payer: Self-pay

## 2021-06-30 ENCOUNTER — Other Ambulatory Visit (HOSPITAL_COMMUNITY): Payer: Self-pay | Admitting: Hematology

## 2021-06-30 DIAGNOSIS — C61 Malignant neoplasm of prostate: Secondary | ICD-10-CM

## 2021-06-30 MED ORDER — NUBEQA 300 MG PO TABS
ORAL_TABLET | ORAL | 0 refills | Status: DC
  Filled 2021-06-30: qty 90, 30d supply, fill #0

## 2021-07-03 ENCOUNTER — Other Ambulatory Visit (HOSPITAL_COMMUNITY): Payer: Self-pay

## 2021-07-04 ENCOUNTER — Other Ambulatory Visit (HOSPITAL_COMMUNITY): Payer: Self-pay

## 2021-07-07 ENCOUNTER — Inpatient Hospital Stay (HOSPITAL_COMMUNITY): Payer: Medicare Other

## 2021-07-07 ENCOUNTER — Encounter (HOSPITAL_COMMUNITY): Payer: Self-pay | Admitting: Hematology

## 2021-07-07 ENCOUNTER — Inpatient Hospital Stay (HOSPITAL_COMMUNITY): Payer: Medicare Other | Attending: Hematology | Admitting: Hematology

## 2021-07-07 VITALS — BP 135/99 | HR 92 | Temp 97.5°F | Resp 18 | Ht 73.0 in | Wt 178.5 lb

## 2021-07-07 DIAGNOSIS — C641 Malignant neoplasm of right kidney, except renal pelvis: Secondary | ICD-10-CM

## 2021-07-07 DIAGNOSIS — C7971 Secondary malignant neoplasm of right adrenal gland: Secondary | ICD-10-CM | POA: Diagnosis not present

## 2021-07-07 DIAGNOSIS — R634 Abnormal weight loss: Secondary | ICD-10-CM | POA: Diagnosis not present

## 2021-07-07 DIAGNOSIS — C61 Malignant neoplasm of prostate: Secondary | ICD-10-CM

## 2021-07-07 DIAGNOSIS — C642 Malignant neoplasm of left kidney, except renal pelvis: Secondary | ICD-10-CM | POA: Diagnosis not present

## 2021-07-07 LAB — COMPREHENSIVE METABOLIC PANEL
ALT: 20 U/L (ref 0–44)
AST: 22 U/L (ref 15–41)
Albumin: 3.6 g/dL (ref 3.5–5.0)
Alkaline Phosphatase: 61 U/L (ref 38–126)
Anion gap: 8 (ref 5–15)
BUN: 63 mg/dL — ABNORMAL HIGH (ref 8–23)
CO2: 18 mmol/L — ABNORMAL LOW (ref 22–32)
Calcium: 9.3 mg/dL (ref 8.9–10.3)
Chloride: 101 mmol/L (ref 98–111)
Creatinine, Ser: 2.21 mg/dL — ABNORMAL HIGH (ref 0.61–1.24)
GFR, Estimated: 27 mL/min — ABNORMAL LOW (ref >60)
Glucose, Bld: 176 mg/dL — ABNORMAL HIGH (ref 70–99)
Potassium: 4.6 mmol/L (ref 3.5–5.1)
Sodium: 127 mmol/L — ABNORMAL LOW (ref 135–145)
Total Bilirubin: 0.9 mg/dL (ref 0.3–1.2)
Total Protein: 7.2 g/dL (ref 6.5–8.1)

## 2021-07-07 LAB — CBC WITH DIFFERENTIAL/PLATELET
Abs Immature Granulocytes: 0.07 10*3/uL (ref 0.00–0.07)
Basophils Absolute: 0 10*3/uL (ref 0.0–0.1)
Basophils Relative: 0 %
Eosinophils Absolute: 0 10*3/uL (ref 0.0–0.5)
Eosinophils Relative: 0 %
HCT: 38.2 % — ABNORMAL LOW (ref 39.0–52.0)
Hemoglobin: 13.5 g/dL (ref 13.0–17.0)
Immature Granulocytes: 1 %
Lymphocytes Relative: 15 %
Lymphs Abs: 1.2 10*3/uL (ref 0.7–4.0)
MCH: 35.2 pg — ABNORMAL HIGH (ref 26.0–34.0)
MCHC: 35.3 g/dL (ref 30.0–36.0)
MCV: 99.7 fL (ref 80.0–100.0)
Monocytes Absolute: 0.7 10*3/uL (ref 0.1–1.0)
Monocytes Relative: 10 %
Neutro Abs: 5.5 10*3/uL (ref 1.7–7.7)
Neutrophils Relative %: 74 %
Platelets: 296 10*3/uL (ref 150–400)
RBC: 3.83 MIL/uL — ABNORMAL LOW (ref 4.22–5.81)
RDW: 12.1 % (ref 11.5–15.5)
WBC: 7.5 10*3/uL (ref 4.0–10.5)
nRBC: 0.3 % — ABNORMAL HIGH (ref 0.0–0.2)

## 2021-07-07 LAB — MAGNESIUM: Magnesium: 2.1 mg/dL (ref 1.7–2.4)

## 2021-07-07 MED ORDER — SODIUM CHLORIDE 0.9 % IV SOLN: Freq: Once | INTRAVENOUS | Status: AC

## 2021-07-07 NOTE — Patient Instructions (Addendum)
[place] at [facility] Discharge Instructions  You were seen and examined today by Dr. Delton Coombes.  Dr. Delton Coombes reviewed your recent labs, which appears that you are dehydrated. You should be drinking at least 70oz of water each day.  Dr. Delton Coombes will give you fluids today.  Cut back on Nub[place] to only one tablet twice daily.  Follow-up as scheduled.   Thank you for choosing [place] at [facility] to provide your oncology and hematology care.  To afford each patient quality time with our provider, please arrive at least 15 minutes before your scheduled appointment time.   If you have a lab appointment with the [place] please come in thru the Main Entrance and check in at the main information desk.  You need to re-schedule your appointment should you arrive 10 or more minutes late.  We strive to give you quality time with our providers, and arriving late affects you and other patients whose appointments are after yours.  Also, if you no show three or more times for appointments you may be dismissed from the clinic at the providers discretion.     Again, thank you for choosing [facility].  Our hope is that these requests will decrease the amount of time that you wait before being seen by our physicians.       _____________________________________________________________  Should you have questions after your visit to [facility], please contact our office at [phone] and follow the prompts.  Our office hours are 8:00 a.m. and 4:30 p.m. Monday - Friday.  Please note that voicemails left after 4:00 p.m. may not be returned until the following business day.  We are closed weekends and major holidays.  You do have access to a nurse 24-7, just call the main number to the clinic [phone] and do not press any options, hold on the line and a nurse will answer the phone.    For prescription refill  requests, have your pharmacy contact our office and allow 72 hours.    Due to Covid, you will need to wear a mask upon entering the hospital. If you do not have a mask, a mask will be given to you at the Main Entrance upon arrival. For doctor visits, patients may have 1 support person age 57 or older with them. For treatment visits, patients can not have anyone with them due to social distancing guidelines and our immunocompromised population.

## 2021-07-07 NOTE — Patient Instructions (Signed)
[place]  Discharge Instructions: Thank you for choosing [place] to provide your oncology and hematology care.  If you have a lab appointment with the [facility], please come in thru the Main Entrance and check in at the main information desk.  Wear comfortable clothing and clothing appropriate for easy access to any Portacath or PICC line.   We strive to give you quality time with your provider. You may need to reschedule your appointment if you arrive late (15 or more minutes).  Arriving late affects you and other patients whose appointments are after yours.  Also, if you miss three or more appointments without notifying the office, you may be dismissed from the clinic at the provider's discretion.      For prescription refill requests, have your pharmacy contact our office and allow 72 hours for refills to be completed.    Today you received 1 Liter over 2 hours    BELOW ARE SYMPTOMS THAT SHOULD BE REPORTED IMMEDIATELY: *FEVER GREATER THAN 100.4 F (38 C) OR HIGHER *CHILLS OR SWEATING *NAUSEA AND VOMITING THAT IS NOT CONTROLLED WITH YOUR NAUSEA MEDICATION *UNUSUAL SHORTNESS OF BREATH *UNUSUAL BRUISING OR BLEEDING *URINARY PROBLEMS (pain or burning when urinating, or frequent urination) *BOWEL PROBLEMS (unusual diarrhea, constipation, pain near the anus) TENDERNESS IN MOUTH AND THROAT WITH OR WITHOUT PRESENCE OF ULCERS (sore throat, sores in mouth, or a toothache) UNUSUAL RASH, SWELLING OR PAIN  UNUSUAL VAGINAL DISCHARGE OR ITCHING   Items with * indicate a potential emergency and should be followed up as soon as possible or go to the Emergency Department if any problems should occur.  Please show the CHEMOTHERAPY ALERT CARD or IMMUNOTHERAPY ALERT CARD at check-in to the Emergency Department and triage nurse.  Should you have questions after your visit or need to cancel or reschedule your appointment, please contact [facility]  [phone]  and follow the prompts.  Office hours are 8:00 a.m. to 4:30 p.m. Monday - Friday. Please note that voicemails left after 4:00 p.m. may not be returned until the following business day.  We are closed weekends and major holidays. You have access to a nurse at all times for urgent questions. Please call the main number to the clinic [phone] and follow the prompts.  For any non-urgent questions, you may also contact your provider using MyChart. We now offer e-Visits for anyone 64 and older to request care online for non-urgent symptoms. For details visit [email or web address].   Also download the MyChart app! Go to the app store, search "MyChart", open the app, select [place], and log in with your MyChart username and password.  Due to Covid, a mask is required upon entering the hospital/clinic. If you do not have a mask, one will be given to you upon arrival. For doctor visits, patients may have 1 support person aged 9 or older with them. For treatment visits, patients cannot have anyone with them due to current Covid guidelines and our immunocompromised population.

## 2021-07-07 NOTE — Progress Notes (Signed)
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    CANCER STAGING:  Cancer Staging  Prostate cancer [facility]) Staging form: Prostate, AJCC 8th Edition - Clinical stage from 07/10/2020: Stage IVB (cTX, cNX, pM1c, PSA: 10.2) - Unsigned   INTERVAL HISTORY:  [name], a 88 y.o. 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 Derrick Long was last seen on 06/02/2021.   Today he reports feeling well, and he is accompanied his 2 daughters. He has lost 7 lbs since his last visit. He started darolutamide on 5/4. He continues to take magnesium and Megace. His appetite waxes and wanes. His daughter reports he is dehydrated, and he admits he does not drink many fluids daily. He reports fatigue. He denies n/v/d. He is eating about 4 small meals daily. He reports occasional dizziness upon standing.    REVIEW OF SYSTEMS:  Review of Systems  Constitutional:  Positive for appetite change, fatigue and unexpected weight change (-7 lbs).  Gastrointestinal:  Negative for diarrhea, nausea and vomiting.  Musculoskeletal:  Positive for arthralgias (6/10 shoulders) and back pain (6/10).  Neurological:  Positive for dizziness.  Psychiatric/Behavioral:  Positive for depression and sleep disturbance. The patient is nervous/anxious.   All other systems reviewed and are negative.  PAST MEDICAL/SURGICAL HISTORY:  Past Medical History:  Diagnosis Date   Acid reflux    Anemia    Colon cancer ([place])    per patient, colon cancer more than 10 years ago s/p  right hemicolectomy   Ex-cigarette smoker    Gout    Hyperlipemia    Hypertension    Paroxysmal atrial fibrillation ([place]) 2014   Prostate cancer ([place]) 08/08/2013   SEEDS 20 YEARS AGO   Prostate disease    Past Surgical History:  Procedure Laterality Date   BIOPSY  02/05/2021   Procedure: BIOPSY;  Surgeon: Harvel Quale, MD;  Location: [facility] ENDO SUITE;  Service: Gastroenterology;;   COLON SURGERY     Right hemicolectomy in the setting of colon cancer, per patient   CYSTOSCOPY WITH INSERTION OF UROLIFT N/A 05/19/2021   Procedure: CYSTOSCOPY WITH INSERTION OF UROLIFT;  Surgeon: Cleon Gustin, MD;  Location: [facility] ORS;  Service: Urology;  Laterality: N/A;   ESOPHAGOGASTRODUODENOSCOPY (EGD) WITH PROPOFOL N/A 02/05/2021   Procedure: ESOPHAGOGASTRODUODENOSCOPY (EGD) WITH PROPOFOL;  Surgeon: Harvel Quale, MD;  Location: [facility] ENDO SUITE;  Service: Gastroenterology;  Laterality: N/A;   LEFT HEART CATHETERIZATION WITH CORONARY ANGIOGRAM N/A 07/21/2012   Procedure: LEFT HEART CATHETERIZATION WITH CORONARY ANGIOGRAM;  Surgeon: Troy Sine, MD;  Location: [facility] CATH LAB;  Service: Cardiovascular;  Laterality: N/A;   PACEMAKER INSERTION     PERMANENT PACEMAKER INSERTION N/A 07/22/2012   Procedure: PERMANENT PACEMAKER INSERTION;  Surgeon: Sanda Klein, MD;  Location: [facility] CATH LAB;  Service: Cardiovascular;  Laterality: N/A;   PROSTATE SURGERY      SOCIAL HISTORY:  Social History   Socioeconomic History   Marital status: Married    Spouse name: Not on file   Number of children: Not on file  Years of education: Not on file   Highest education level: Not on file  Occupational History   Not on file  Tobacco Use   Smoking status: Former    Types: Cigarettes    Quit date: 07/20/2002    Years since quitting: 18.9   Smokeless tobacco: Never  Vaping Use   Vaping Use: Never used  Substance and Sexual Activity   Alcohol use: No   Drug use: No   Sexual activity: Not on  file  Other Topics Concern   Not on file  Social History Narrative   Not on file   Social Determinants of Health   Financial Resource Strain: Not on file  Food Insecurity: Not on file  Transportation Needs: Not on file  Physical Activity: Not on file  Stress: Not on file  Social Connections: Not on file  Intimate Partner Violence: Not on file    FAMILY HISTORY:  Family History  Problem Relation Age of Onset   Cancer Neg Hx     CURRENT MEDICATIONS:  Current Outpatient Medications  Medication Sig Dispense Refill   acetaminophen (TYLENOL) 650 MG CR tablet Take 1,300 mg by mouth every 8 (eight) hours as needed for pain.     ascorbic acid (VITAMIN C) 250 MG tablet TAKE ONE TABLET BY MOUTH THREE TIMES A DAY WITH MEALS - TAKE WITH IRON TO PROMOTE BETTER ABSORPTION     atorvastatin (LIPITOR) 20 MG tablet Take 1 tablet (20 mg total) by mouth daily at 6 PM. 30 tablet 5   carvedilol (COREG) 3.125 MG tablet Take 1 tablet (3.125 mg total) by mouth 2 (two) times daily. 60 tablet 0   Cholecalciferol 25 MCG (1000 UT) tablet Take 1,000 Units by mouth daily.     clotrimazole-betamethasone (LOTRISONE) cream Apply 1 application. topically 2 (two) times daily. 30 g 0   darolutamide (NUBEQA) 300 MG tablet Take 1 tablet (300 mg) by mouth twice daily for 2 weeks, then increase to 2 tablets (600 mg total) by mouth twice daily. Take with food. 90 tablet 0   diclofenac sodium (VOLTAREN) 1 % GEL Apply 2 g topically 4 (four) times daily. (Patient taking differently: Apply 2 g topically 4 (four) times daily as needed (pain).) 100 g 0   doxycycline (VIBRAMYCIN) 100 MG capsule Take 1 capsule (100 mg total) by mouth every 12 (twelve) hours. 14 capsule 0   Ensure (ENSURE) Take 237 mLs by mouth daily as needed (nutrition). Vanilla or chocolate     ferrous sulfate 324 MG TBEC Take 324 mg by mouth 2 (two) times daily.     fluticasone (FLONASE) 50 MCG/ACT nasal spray Place 2 sprays into both nostrils daily. (Patient  taking differently: Place 2 sprays into both nostrils daily as needed for allergies.) 16 g 2   furosemide (LASIX) 40 MG tablet TAKE ONE TABLET BY MOUTH ONCE EVERY DAY FOR BLOOD PRESSURE AND FOR FLUID CONTROL     magnesium oxide (MAG-OX) 400 (240 Mg) MG tablet Take 1 tablet (400 mg total) by mouth 3 (three) times daily. 90 tablet 3   megestrol (MEGACE) 400 MG/10ML suspension Take 10 mLs (400 mg total) by mouth 2 (two) times daily. 480 mL 2   pantoprazole (PROTONIX) 40 MG tablet TAKE 1 TABLET BY MOUTH ONCE DAILY. 30 tablet 0   potassium chloride SA (KLOR-CON) 20 MEQ tablet Take 20 mEq by mouth daily.     predniSONE (DELTASONE) 5 MG tablet Take 5 mg by mouth every morning.  rivaroxaban (XARELTO) 20 MG TABS tablet Take 1 tablet (20 mg total) by mouth daily with supper. 20 tablet 0   tamsulosin (FLOMAX) 0.4 MG CAPS capsule Take 1 capsule (0.4 mg total) by mouth daily after supper. 30 capsule 11   traMADol (ULTRAM) 50 MG tablet Take 1 tablet (50 mg total) by mouth every 6 (six) hours as needed. 20 tablet 0   No current facility-administered medications for this visit.    ALLERGIES:  No Known Allergies  PHYSICAL EXAM:  Performance status (ECOG): 1 - Symptomatic but completely ambulatory  Vitals:   07/07/21 1102  BP: (!) 135/99  Pulse: 92  Resp: 18  Temp: (!) 97.5 F (36.4 C)  SpO2: 98%   Wt Readings from Last 3 Encounters:  07/07/21 178 lb 8 oz (81 kg)  06/02/21 185 lb 14.4 oz (84.3 kg)  05/25/21 180 lb (81.6 kg)   Physical Exam Vitals reviewed.  Constitutional:      Appearance: Normal appearance.     Comments: In wheelchair  Cardiovascular:     Rate and Rhythm: Normal rate and regular rhythm.     Pulses: Normal pulses.     Heart sounds: Normal heart sounds.  Pulmonary:     Effort: Pulmonary effort is normal.     Breath sounds: Normal breath sounds.  Musculoskeletal:     Right lower leg: No edema.     Left lower leg: No edema.  Neurological:     General: No focal deficit  present.     Mental Status: He is alert and oriented to person, place, and time.  Psychiatric:        Mood and Affect: Mood normal.        Behavior: Behavior normal.     LABORATORY DATA:  I have reviewed the labs as listed.     Latest Ref Rng & Units 07/07/2021   10:04 AM 05/26/2021    1:54 PM 05/25/2021    6:51 PM  CBC  WBC 4.0 - 10.5 K/uL 7.5   5.7   7.5    Hemoglobin 13.0 - 17.0 g/dL 13.5   10.9   11.3    Hematocrit 39.0 - 52.0 % 38.2   30.8   34.2    Platelets 150 - 400 K/uL 296   247   264        Latest Ref Rng & Units 07/07/2021   10:04 AM 05/26/2021    1:54 PM 05/25/2021    6:51 PM  CMP  Glucose 70 - 99 mg/dL 176   107   118    BUN 8 - 23 mg/dL 63   25   24    Creatinine 0.61 - 1.24 mg/dL 2.21   1.27   1.38    Sodium 135 - 145 mmol/L 127   134   135    Potassium 3.5 - 5.1 mmol/L 4.6   4.0   4.2    Chloride 98 - 111 mmol/L 101   108   106    CO2 22 - 32 mmol/L '18   21   21    '$ Calcium 8.9 - 10.3 mg/dL 9.3   9.1   9.2    Total Protein 6.5 - 8.1 g/dL 7.2   6.5     Total Bilirubin 0.3 - 1.2 mg/dL 0.9   0.9     Alkaline Phos 38 - 126 U/L 61   52     AST 15 - 41 U/L 22   17  ALT 0 - 44 U/L 20   14       DIAGNOSTIC IMAGING:  I have independently reviewed the scans and discussed with the patient. No results found.   ASSESSMENT:  1.  Metastatic prostate cancer to the right adrenal gland: -Presentation to the ER with worsening right-sided back pain, which has been on and off for the last couple of years. - Some decrease in appetite with 6 pound weight loss in the last 6 months.  No hematuria or other B symptoms. - CT renal study on 05/03/2020 showed new right adrenal mass with bilateral renal lesions.  Indeterminate L2 vertebral bodies sclerotic lesion.  Low-attenuation lesions in the kidneys measure up to 2.3 cm on the right and were similar to scan from July 2021. - CT CAP on 06/12/2020 did not show any evidence of nodal metastasis or metastatic disease in the chest.  Right  adrenal mass measures 4.2 x 3.8 cm exerts mass-effect upon the posterior and right lateral wall of the IVC.  Right kidney masses measuring 2.3 x 2.3 cm and 1.9 x 1.7 cm suspicious for RCC.  Upper pole of the left kidney lesion 2.2 x 2.0 cm and medial cortex of the inferior pole measuring 2 x 2 cm and another interpolar left kidney lesion measuring 1.3 x 1.5 cm. - Bone scan on 06/13/2020 was negative. - Right adrenal biopsy consistent with prostatic adenocarcinoma - Degarelix on 07/16/2020, transition to Eligard 45 mg on 08/21/2020. - Abiraterone 100 mg daily/prednisone 5 mg daily started around 01/22/2021. - Darolutamide 300 mg twice daily started on 06/19/2021, increased to 600 mg twice daily on 06/26/2021.   2.  Social/family history: - He lives at home with his daughter.  He walks with a cane. - He mows lawn with a riding mower.  He has a garden and gross vegetables.  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  Quit smoking 25 years ago. - No family history of malignancies that he is aware of.   PLAN:  1.  Metastatic prostate cancer to the right adrenal gland: - Darolutamide 300 mg twice daily started on 06/19/2021.  -He has increased darolutamide to 600 mg twice daily on 06/26/2021. - He reported decreased fluid intake and some tiredness since the dose was increased.  He is eating 4 small meals a day. - Reviewed labs today which showed creatinine 2.21, up from 1.27 at last visit.  BUN is 63 indicating dehydration.  Sodium was 127.  Hemoglobin is 13.5 with normal white count and platelet count. - Recommend 1 L of normal saline today. - Recommend decreasing dose of Adderall admitted to 300 mg twice daily. - RTC 2 weeks with repeat labs.  2.  Right sided back pain: - He is not requiring tramadol for his back pain.  3.  Hypomagnesemia: - Continue magnesium 3 times daily.  Magnesium today is normal.  4.  Weight loss: - He has lost 7 pounds in the last 1 month. - Continue Megace twice daily.    Orders placed this encounter:  No orders of the defined types were placed in this encounter.    Derek Jack, MD [facility] [phone]   I, Thana Ates, am acting as a scribe for Dr. Derek Jack.  I, Derek Jack MD, have reviewed the above documentation for accuracy and completeness, and I agree with the above.

## 2021-07-07 NOTE — Progress Notes (Signed)
Pt present today for 1 liter of NS over 2 hours per provider's order. Vital signs stable and pt voiced no new complaints at this time.  Peripheral IV started with good blood return pre and post infusion.  1 liter of NS over 2 hours given today per MD orders. Tolerated infusion without adverse affects. Vital signs stable. No complaints at this time. Discharged from clinic via wheelchair in stable condition. Alert and oriented x 3. F/U with [facility] as scheduled.

## 2021-07-17 ENCOUNTER — Ambulatory Visit (INDEPENDENT_AMBULATORY_CARE_PROVIDER_SITE_OTHER): Payer: Medicare Other | Admitting: Physician Assistant

## 2021-07-17 VITALS — BP 178/50 | HR 92 | Ht 73.0 in | Wt 178.0 lb

## 2021-07-17 DIAGNOSIS — C61 Malignant neoplasm of prostate: Secondary | ICD-10-CM

## 2021-07-17 DIAGNOSIS — R339 Retention of urine, unspecified: Secondary | ICD-10-CM

## 2021-07-17 LAB — MICROSCOPIC EXAMINATION
RBC, Urine: NONE SEEN /hpf (ref 0–2)
Renal Epithel, UA: NONE SEEN /hpf
WBC, UA: NONE SEEN /hpf (ref 0–5)

## 2021-07-17 LAB — URINALYSIS, ROUTINE W REFLEX MICROSCOPIC
Bilirubin, UA: NEGATIVE
Glucose, UA: NEGATIVE
Ketones, UA: NEGATIVE
Leukocytes,UA: NEGATIVE
Nitrite, UA: NEGATIVE
Specific Gravity, UA: 1.015 (ref 1.005–1.030)
Urobilinogen, Ur: 0.2 mg/dL (ref 0.2–1.0)
pH, UA: 7 (ref 5.0–7.5)

## 2021-07-17 LAB — BLADDER SCAN AMB NON-IMAGING: Scan Result: 128

## 2021-07-17 NOTE — Progress Notes (Signed)
post void residual =158m

## 2021-07-17 NOTE — Progress Notes (Signed)
Assessment: 1. Urinary retention - BLADDER SCAN AMB NON-IMAGING - Urinalysis, Routine w reflex microscopic    Plan: Continue Flomax and follow-up in 6 months for UA and PVR.  He will continue prostate, renal, adrenal cancer treatments with oncology.  Also reminded patient to consider using his walker when out and about rather than a cane to offer better stability with ambulation.  Chief Complaint: No chief complaint on file.   HPI: Derrick Long is a 88 y.o. male with metastatic prostate cancer followed by oncology who presents for continued evaluation of urinary retention post Urolift on 05/18/21.  He has been doing well since his last visit 6 weeks ago and has no complaints of pain, incomplete emptying, gross hematuria.  IPSS score is 17 with quality-of-life = 3.  He continues Flomax daily.  Office visits notes reviewed from oncology and patient noted to be doing well for bilateral renal cell carcinoma and right adrenal gland treatments. PSA ordered by oncology on 6/5 back down to 1.73, down from 5.97 after his UroLift 1 month ago. UA= few bacteria, nitrite negative, otherwise clear PVR = 128 mL  06/02/21 Derrick Long is a 88 y.o. male with h/o metastatic prostate CA and BPH with obstruction who presents with his daughters for continued evaluation of urinary retention and UTI post Urolift. DOS: 05/19/21 He denies c/o today and reports that he is satisfied with his urine flow. He continues Flomax once daily. No dysuria, gross hematuria. Pt was seen in the ED on 05/25/21 with c/o dizziness. Workup was negative. UA= 2+protein, no WBC, nitrite negative PVR= 264 Urine cx on 05/22/21=no growth PSA 5.97 on 05/26/21-ordered by oncology. 3.98 one month prior     05/23/21 Pt doing well post voiding trial. Advised to continue Rapaflo HS and Doxy as Rx for previous UTI.   05/21/21 Derrick Long is a 88yo here for followup for urinary retention. He underwent Urolift 2 day ago and foley was removed  today. He was able to urinate multiple times since the foley was removed. No dysuria. No urinary frequency or urgency. No other complaints today  Portions of the above documentation were copied from a prior visit for review purposes only.  Allergies: No Known Allergies  PMH: Past Medical History:  Diagnosis Date   Acid reflux    Anemia    Colon cancer ([place])    per patient, colon cancer more than 10 years ago s/p right hemicolectomy   Ex-cigarette smoker    Gout    Hyperlipemia    Hypertension    Paroxysmal atrial fibrillation ([place]) 2014   Prostate cancer ([place]) 08/08/2013   SEEDS 20 YEARS AGO   Prostate disease     PSH: Past Surgical History:  Procedure Laterality Date   BIOPSY  02/05/2021   Procedure: BIOPSY;  Surgeon: Harvel Quale, MD;  Location: [facility];  Service: Gastroenterology;;   COLON SURGERY     Right hemicolectomy in the setting of colon cancer, per patient   CYSTOSCOPY WITH INSERTION OF UROLIFT N/A 05/19/2021   Procedure: CYSTOSCOPY WITH INSERTION OF UROLIFT;  Surgeon: Cleon Gustin, MD;  Location: [facility];  Service: Urology;  Laterality: N/A;   ESOPHAGOGASTRODUODENOSCOPY (EGD) WITH PROPOFOL N/A 02/05/2021   Procedure: ESOPHAGOGASTRODUODENOSCOPY (EGD) WITH PROPOFOL;  Surgeon: Harvel Quale, MD;  Location: [facility];  Service: Gastroenterology;  Laterality: N/A;   LEFT HEART CATHETERIZATION WITH CORONARY ANGIOGRAM N/A 07/21/2012   Procedure: LEFT HEART CATHETERIZATION WITH CORONARY ANGIOGRAM;  Surgeon: Marcello Moores  Floyce Stakes, MD;  Location: [facility] CATH LAB;  Service: Cardiovascular;  Laterality: N/A;   PACEMAKER INSERTION     PERMANENT PACEMAKER INSERTION N/A 07/22/2012   Procedure: PERMANENT PACEMAKER INSERTION;  Surgeon: Sanda Klein, MD;  Location: [place] CATH LAB;  Service: Cardiovascular;  Laterality: N/A;   PROSTATE SURGERY      SH: Social History   Tobacco Use   Smoking status: Former    Types: Cigarettes    Quit date: 07/20/2002     Years since quitting: 19.0   Smokeless tobacco: Never  Vaping Use   Vaping Use: Never used  Substance Use Topics   Alcohol use: No   Drug use: No    ROS: Constitutional:  Negative for fever, chills, weight loss CV: Negative for chest pain Respiratory:  Negative for shortness of breath, wheezing, sleep apnea, frequent cough GI:  Negative for nausea, vomiting, bloody stool, GERD  PE: BP (!) 178/50   Pulse 92   Ht '6\' 1"'$  (1.854 m)   Wt 178 lb (80.7 kg)   BMI 23.48 kg/m  GENERAL APPEARANCE:  Well appearing, well developed, well nourished, NAD HEENT:  Atraumatic, normocephalic NECK:  Supple. Trachea midline ABDOMEN:  Soft, non-tender, no masses EXTREMITIES:  Moves all extremities well, without clubbing, cyanosis, or edema NEUROLOGIC:  Alert and oriented x 3, normal gait, CN II-XII grossly intact MENTAL STATUS:  appropriate BACK:  Non-tender to palpation, No CVAT SKIN:  Warm, dry, and intact   Results: Laboratory Data: Lab Results  Component Value Date   WBC 7.5 07/07/2021   HGB 13.5 07/07/2021   HCT 38.2 (L) 07/07/2021   MCV 99.7 07/07/2021   PLT 296 07/07/2021    Lab Results  Component Value Date   CREATININE 2.21 (H) 07/07/2021    Lab Results  Component Value Date   PSA Refused 12/22/2007   PSA 0.07 (L) 06/24/2007   PSA 0.10 05/25/2006    No results found for: TESTOSTERONE  Lab Results  Component Value Date   HGBA1C 5.6 07/19/2012    Urinalysis    Component Value Date/Time   COLORURINE YELLOW 05/25/2021 2016   APPEARANCEUR Clear 06/02/2021 1200   LABSPEC 1.012 05/25/2021 2016   PHURINE 5.0 05/25/2021 2016   GLUCOSEU Negative 06/02/2021 1200   HGBUR MODERATE (A) 05/25/2021 2016   HGBUR negative 06/24/2007 0833   BILIRUBINUR Negative 06/02/2021 1200   KETONESUR NEGATIVE 05/25/2021 2016   PROTEINUR 2+ (A) 06/02/2021 1200   PROTEINUR 100 (A) 05/25/2021 2016   UROBILINOGEN 1.0 07/29/2013 2032   NITRITE Negative 06/02/2021 1200   NITRITE  NEGATIVE 05/25/2021 2016   LEUKOCYTESUR Negative 06/02/2021 1200   LEUKOCYTESUR NEGATIVE 05/25/2021 2016    Lab Results  Component Value Date   LABMICR See below: 06/02/2021   WBCUA 0-5 06/02/2021   LABEPIT None seen 06/02/2021   MUCUS Present 05/22/2021   BACTERIA None seen 06/02/2021    Pertinent Imaging:  Results for orders placed in visit on 07/29/01  DG Abd 1 View  Narrative FINDINGS CLINICAL DATA:  PROSTATE CA. SINGLE VIEW ABDOMEN A NORMAL BOWEL GAS PATTERN IS DEMONSTRATED.  MILD TO MODERATE SPUR FORMATION IS DEMONSTRATED AT MULTIPLE LEVELS OF THE LUMBAR SPINE AS WELL AS MINIMAL LEVOCONVEX LUMBAR ROTARY SCOLIOSIS.  ALSO NOTED IS BRIDGING OSSIFICATION OR CALCIFICATION BETWEEN THE LOWER LUMBAR SPINE AND LEFT ILIAC WING. A TRANSITIONAL LUMBOSACRAL VERTEBRA IS ALSO DEMONSTRATED.  MILD ATHEROMATOUS ARTERIAL CALCIFICATIONS ARE NOTED. IMPRESSION NO ACUTE ABNORMALITY AND NO EVIDENCE OF BONY METASTATIC DISEASE.  No results found  for this or any previous visit.  No results found for this or any previous visit.  No results found for this or any previous visit.  Results for orders placed during the hospital encounter of 08/01/13  US Renal  Narrative CLINICAL DATA:  Acute urinary retention  EXAM: RENAL/URINARY TRACT ULTRASOUND COMPLETE  COMPARISON:  04/14/2004  FINDINGS: Right Kidney:  Length: 10.2 cm. Complex 2.0 x 1.8 x 1.6 cm echogenic lesion, right kidney lower pole. We were not able the demonstrate definite blood flow within this lesion. Hypoechoic but minimally complex 2.6 x 2.2 x 1.9 cm lesion, right kidney lower pole.  Left Kidney:  Length: 1.3 cm. Mild prominence of renal sinus adipose tissue. Tiny probable cyst, left mid-upper kidney, 4 mm. Equivocal small mid kidney nonobstructive calculus.  Bladder:  Nondistended.  Catheter in urinary bladder.  IMPRESSION: 1. Complex 2 cm echogenic lesion, right kidney lower pole, in vicinity of prior  enhancing nodule. In 2006 this lesion measured 1.4 cm. This may represent slow growth of a small renal cell carcinoma. 2. Minimally complex 2.6 cm right kidney lower pole lesion, most likely a complex cyst although technically nonspecific. 3. Equivocal small left mid kidney nonobstructive calculus. Mild renal sinus adipose tissue prominence on the left.   Electronically Signed By: Sherryl Barters M.D. On: 08/01/2013 15:54  No results found for this or any previous visit.  No results found for this or any previous visit.  Results for orders placed during the hospital encounter of 02/19/21  CT Renal Stone Study  Narrative CLINICAL DATA:  Urinary retention.  History of prostate cancer.  EXAM: CT ABDOMEN AND PELVIS WITHOUT CONTRAST  TECHNIQUE: Multidetector CT imaging of the abdomen and pelvis was performed following the standard protocol without IV contrast.  COMPARISON:  02/03/2021.  FINDINGS: Lower chest: The heart is normal in size and scattered coronary artery calcifications are noted. Pacemaker leads are present in the heart. A 4 mm nodule is present in the right lower lobe, axial image 8.  Hepatobiliary: The liver is within normal limits. Mild intrahepatic and extrahepatic biliary ductal dilatation is seen which is likely due to patient's post cholecystectomy status.  Pancreas: Unremarkable. No pancreatic ductal dilatation or surrounding inflammatory changes.  Spleen: Normal in size without focal abnormality.  Adrenals/Urinary Tract: There is redemonstration of a right adrenal mass measuring 3.2 cm, unchanged. The left adrenal gland is within normal limits. Scattered hypo and hyperdense structures are present in the kidneys bilaterally. Complex masslike lesions are noted in the kidneys bilaterally measuring 2.6 cm in the mid left kidney and 2.6 cm in the mid right kidney. No renal or ureteral calculus or obstructive uropathy bilaterally. The bladder is  unremarkable.  Stomach/Bowel: The stomach is nondistended. No bowel obstruction, free air or pneumatosis. A few scattered diverticular noted along the colon without evidence of diverticulitis. Right hemicolectomy changes are noted.  Vascular/Lymphatic: Aortic atherosclerosis with ectasia of the distal abdominal aorta. 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 The common iliac artery on the left measures 1.8 cm. No abdominal or pelvic lymphadenopathy.  Reproductive: The prostate gland is enlarged and contains multiple radiopaque brachytherapy seeds.  Other: No free fluid. A small fat containing umbilical hernia is noted. There is a small right inguinal fat containing hernia.  Musculoskeletal: Degenerative changes are present in the thoracolumbar spine and bilateral hips. A stable sclerotic lesion is noted in the L2 vertebral body on the right, possible bone island.  IMPRESSION:  1. No renal or ureteral calculus or obstructive uropathy bilaterally. 2. Multiple hyperdense and hypodense lesions in the kidneys bilaterally. Complex masses are noted in the mid kidneys bilaterally, compatible with history of multifocal renal cell carcinoma and slightly increased in size from the prior exam. 3. Enlarged prostate gland containing brachytherapy seeds. 4. Right lower lobe pulmonary nodule. No follow-up needed if patient is low-risk. Non-contrast chest CT can be considered in 12 months if patient is high-risk. This recommendation follows the consensus statement: Guidelines for Management of Incidental Pulmonary Nodules Detected on CT Images: From the Fleischner Society 2017; Radiology 2017; 284:228-243. 5. Aortic atherosclerosis. 6. Coronary artery calcifications. 7. Remaining chronic findings as described above.   Electronically Signed By: Brett Fairy M.D. On: 02/20/2021 00:28  Results for orders placed or performed in visit on 07/17/21  (from the past 24 hour(s))  BLADDER SCAN AMB NON-IMAGING   Collection Time: 07/17/21  9:38 AM  Result Value Ref Range   Scan Result 128

## 2021-07-21 ENCOUNTER — Encounter (HOSPITAL_COMMUNITY): Payer: Self-pay

## 2021-07-21 ENCOUNTER — Inpatient Hospital Stay (HOSPITAL_COMMUNITY): Payer: Medicare Other

## 2021-07-21 ENCOUNTER — Inpatient Hospital Stay (HOSPITAL_COMMUNITY): Payer: Medicare Other | Attending: Hematology | Admitting: Hematology

## 2021-07-21 ENCOUNTER — Other Ambulatory Visit (HOSPITAL_COMMUNITY): Payer: Self-pay | Admitting: Hematology

## 2021-07-21 VITALS — BP 106/94 | HR 80 | Temp 97.7°F | Resp 16 | Wt 183.2 lb

## 2021-07-21 DIAGNOSIS — C61 Malignant neoplasm of prostate: Secondary | ICD-10-CM

## 2021-07-21 DIAGNOSIS — C642 Malignant neoplasm of left kidney, except renal pelvis: Secondary | ICD-10-CM

## 2021-07-21 DIAGNOSIS — M549 Dorsalgia, unspecified: Secondary | ICD-10-CM | POA: Diagnosis not present

## 2021-07-21 DIAGNOSIS — C7971 Secondary malignant neoplasm of right adrenal gland: Secondary | ICD-10-CM | POA: Diagnosis not present

## 2021-07-21 DIAGNOSIS — C641 Malignant neoplasm of right kidney, except renal pelvis: Secondary | ICD-10-CM

## 2021-07-21 LAB — PSA: Prostatic Specific Antigen: 1.73 ng/mL (ref 0.00–4.00)

## 2021-07-21 LAB — COMPREHENSIVE METABOLIC PANEL
ALT: 23 U/L (ref 0–44)
AST: 23 U/L (ref 15–41)
Albumin: 3.6 g/dL (ref 3.5–5.0)
Alkaline Phosphatase: 64 U/L (ref 38–126)
Anion gap: 6 (ref 5–15)
BUN: 39 mg/dL — ABNORMAL HIGH (ref 8–23)
CO2: 22 mmol/L (ref 22–32)
Calcium: 9 mg/dL (ref 8.9–10.3)
Chloride: 101 mmol/L (ref 98–111)
Creatinine, Ser: 1.85 mg/dL — ABNORMAL HIGH (ref 0.61–1.24)
GFR, Estimated: 34 mL/min — ABNORMAL LOW (ref >60)
Glucose, Bld: 104 mg/dL — ABNORMAL HIGH (ref 70–99)
Potassium: 4.6 mmol/L (ref 3.5–5.1)
Sodium: 129 mmol/L — ABNORMAL LOW (ref 135–145)
Total Bilirubin: 1 mg/dL (ref 0.3–1.2)
Total Protein: 7 g/dL (ref 6.5–8.1)

## 2021-07-21 LAB — CBC WITH DIFFERENTIAL/PLATELET
Abs Immature Granulocytes: 0.01 10*3/uL (ref 0.00–0.07)
Basophils Absolute: 0 10*3/uL (ref 0.0–0.1)
Basophils Relative: 0 %
Eosinophils Absolute: 0 10*3/uL (ref 0.0–0.5)
Eosinophils Relative: 1 %
HCT: 35.3 % — ABNORMAL LOW (ref 39.0–52.0)
Hemoglobin: 12.4 g/dL — ABNORMAL LOW (ref 13.0–17.0)
Immature Granulocytes: 0 %
Lymphocytes Relative: 40 %
Lymphs Abs: 2.1 10*3/uL (ref 0.7–4.0)
MCH: 35 pg — ABNORMAL HIGH (ref 26.0–34.0)
MCHC: 35.1 g/dL (ref 30.0–36.0)
MCV: 99.7 fL (ref 80.0–100.0)
Monocytes Absolute: 0.6 10*3/uL (ref 0.1–1.0)
Monocytes Relative: 11 %
Neutro Abs: 2.5 10*3/uL (ref 1.7–7.7)
Neutrophils Relative %: 48 %
Platelets: 241 10*3/uL (ref 150–400)
RBC: 3.54 MIL/uL — ABNORMAL LOW (ref 4.22–5.81)
RDW: 11.9 % (ref 11.5–15.5)
WBC: 5.3 10*3/uL (ref 4.0–10.5)
nRBC: 0 % (ref 0.0–0.2)

## 2021-07-21 LAB — MAGNESIUM: Magnesium: 2.4 mg/dL (ref 1.7–2.4)

## 2021-07-21 NOTE — Patient Instructions (Addendum)
[place] at [facility] Discharge Instructions   You were seen and examined today by Dr. Delton Coombes.  He reviewed your lab work today which is normal/stable.   Continue Nubeqa as prescribed.   Continue magnesium as prescribed.   Return as scheduled.    Thank you for choosing [place] at [facility] to provide your oncology and hematology care.  To afford each patient quality time with our provider, please arrive at least 15 minutes before your scheduled appointment time.   If you have a lab appointment with the [facility] please come in thru the Main Entrance and check in at the main information desk.  You need to re-schedule your appointment should you arrive 10 or more minutes late.  We strive to give you quality time with our providers, and arriving late affects you and other patients whose appointments are after yours.  Also, if you no show three or more times for appointments you may be dismissed from the clinic at the providers discretion.     Again, thank you for choosing [facility].  Our hope is that these requests will decrease the amount of time that you wait before being seen by our physicians.       _____________________________________________________________  Should you have questions after your visit to [facility], please contact our office at [phone] and follow the prompts.  Our office hours are 8:00 a.m. and 4:30 p.m. Monday - Friday.  Please note that voicemails left after 4:00 p.m. may not be returned until the following business day.  We are closed weekends and major holidays.  You do have access to a nurse 24-7, just call the main number to the clinic [phone] and do not press any options, hold on the line and a nurse will answer the phone.    For prescription refill requests, have your pharmacy contact our office and allow 72 hours.    Due to Covid, you will need to wear a mask  upon entering the hospital. If you do not have a mask, a mask will be given to you at the Main Entrance upon arrival. For doctor visits, patients may have 1 support person age 54 or older with them. For treatment visits, patients can not have anyone with them due to social distancing guidelines and our immunocompromised population.

## 2021-07-21 NOTE — Progress Notes (Signed)
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    CANCER STAGING: Cancer Staging  Prostate cancer [facility]) Staging form: Prostate, AJCC 8th Edition - Clinical stage from 07/10/2020: Stage IVB (cTX, cNX, pM1c, PSA: 10.2) - Unsigned   INTERVAL HISTORY:  Derrick Long, a 88 y.o. 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 Derrick Long was last seen on 06/02/2021.   Today he reports feeling good. He is eating well, and has gained 5 lbs since 5/22. He is taking magnesium. His appetite is good, and he denies n/v/d/c. He continues to have intermittent back pain for which he is taking tramadol prn.   REVIEW OF SYSTEMS:  Review of Systems  Constitutional:  Negative for appetite change, fatigue and unexpected weight change.  Gastrointestinal:  Negative for constipation, diarrhea, nausea and vomiting.  Musculoskeletal:  Positive for back pain.  Psychiatric/Behavioral:  Positive for depression and sleep disturbance. The patient is nervous/anxious.   All other systems reviewed and are negative.  PAST MEDICAL/SURGICAL HISTORY:  Past Medical History:  Diagnosis Date   Acid reflux    Anemia    Colon cancer ([place])    per patient, colon cancer more than 10 years ago s/p right hemicolectomy   Ex-cigarette smoker    Gout    Hyperlipemia    Hypertension    Paroxysmal atrial fibrillation ([place]) 2014   Prostate cancer ([place]) 08/08/2013   SEEDS 20 YEARS AGO   Prostate disease    Past Surgical History:  Procedure Laterality Date   BIOPSY  02/05/2021    Procedure: BIOPSY;  Surgeon: Harvel Quale, MD;  Location: [facility];  Service: Gastroenterology;;   COLON SURGERY     Right hemicolectomy in the setting of colon cancer, per patient   CYSTOSCOPY WITH INSERTION OF UROLIFT N/A 05/19/2021   Procedure: CYSTOSCOPY WITH INSERTION OF UROLIFT;  Surgeon: Cleon Gustin, MD;  Location: [facility] ORS;  Service: Urology;  Laterality: N/A;   ESOPHAGOGASTRODUODENOSCOPY (EGD) WITH PROPOFOL N/A 02/05/2021   Procedure: ESOPHAGOGASTRODUODENOSCOPY (EGD) WITH PROPOFOL;  Surgeon: Harvel Quale, MD;  Location: [facility];  Service: Gastroenterology;  Laterality: N/A;   LEFT HEART CATHETERIZATION WITH CORONARY ANGIOGRAM N/A 07/21/2012   Procedure: LEFT HEART CATHETERIZATION WITH CORONARY ANGIOGRAM;  Surgeon: Troy Sine, MD;  Location: [facility] CATH LAB;  Service: Cardiovascular;  Laterality: N/A;   PACEMAKER INSERTION     PERMANENT PACEMAKER INSERTION N/A 07/22/2012   Procedure: PERMANENT PACEMAKER INSERTION;  Surgeon: Sanda Klein, MD;  Location: [facility] CATH LAB;  Service: Cardiovascular;  Laterality: N/A;   PROSTATE SURGERY      SOCIAL HISTORY:  Social History   Socioeconomic History   Marital status: Married    Spouse name: Not on file   Number of children: Not on file   Years of education: Not on file   Highest education level: Not on file  Occupational History   Not on file  Tobacco Use   Smoking status: Former    Types: Cigarettes    Quit date: 07/20/2002    Years since quitting:  19.0   Smokeless tobacco: Never  Vaping Use   Vaping Use: Never used  Substance and Sexual Activity   Alcohol use: No   Drug use: No   Sexual activity: Not on file  Other Topics Concern   Not on file  Social History Narrative   Not on file   Social Determinants of Health   Financial Resource Strain: Not on file  Food Insecurity: Not on file  Transportation Needs: Not on file  Physical Activity: Not on file  Stress: Not on file  Social  Connections: Not on file  Intimate Partner Violence: Not on file    FAMILY HISTORY:  Family History  Problem Relation Age of Onset   Cancer Neg Hx     CURRENT MEDICATIONS:  Current Outpatient Medications  Medication Sig Dispense Refill   acetaminophen (TYLENOL) 650 MG CR tablet Take 1,300 mg by mouth every 8 (eight) hours as needed for pain.     ascorbic acid (VITAMIN C) 250 MG tablet TAKE ONE TABLET BY MOUTH THREE TIMES A DAY WITH MEALS - TAKE WITH IRON TO PROMOTE BETTER ABSORPTION     atorvastatin (LIPITOR) 20 MG tablet Take 1 tablet (20 mg total) by mouth daily at 6 PM. 30 tablet 5   carvedilol (COREG) 3.125 MG tablet Take 1 tablet (3.125 mg total) by mouth 2 (two) times daily. 60 tablet 0   Cholecalciferol 25 MCG (1000 UT) tablet Take 1,000 Units by mouth daily.     clotrimazole-betamethasone (LOTRISONE) cream Apply 1 application. topically 2 (two) times daily. 30 g 0   darolutamide (NUBEQA) 300 MG tablet Take 1 tablet (300 mg) by mouth twice daily for 2 weeks, then increase to 2 tablets (600 mg total) by mouth twice daily. Take with food. 90 tablet 0   diclofenac sodium (VOLTAREN) 1 % GEL Apply 2 g topically 4 (four) times daily. (Patient taking differently: Apply 2 g topically 4 (four) times daily as needed (pain).) 100 g 0   Ensure (ENSURE) Take 237 mLs by mouth daily as needed (nutrition). Vanilla or chocolate     ferrous sulfate 324 MG TBEC Take 324 mg by mouth 2 (two) times daily.     fluticasone (FLONASE) 50 MCG/ACT nasal spray Place 2 sprays into both nostrils daily. (Patient taking differently: Place 2 sprays into both nostrils daily as needed for allergies.) 16 g 2   furosemide (LASIX) 40 MG tablet TAKE ONE TABLET BY MOUTH ONCE EVERY DAY FOR BLOOD PRESSURE AND FOR FLUID CONTROL     magnesium oxide (MAG-OX) 400 (240 Mg) MG tablet Take 1 tablet (400 mg total) by mouth 3 (three) times daily. 90 tablet 3   megestrol (MEGACE) 400 MG/10ML suspension Take 10 mLs (400 mg total) by  mouth 2 (two) times daily. 480 mL 2   pantoprazole (PROTONIX) 40 MG tablet TAKE 1 TABLET BY MOUTH ONCE DAILY. 30 tablet 0   potassium chloride SA (KLOR-CON) 20 MEQ tablet Take 20 mEq by mouth daily.     predniSONE (DELTASONE) 5 MG tablet Take 5 mg by mouth every morning.     rivaroxaban (XARELTO) 20 MG TABS tablet Take 1 tablet (20 mg total) by mouth daily with supper. 20 tablet 0   tamsulosin (FLOMAX) 0.4 MG CAPS capsule Take 1 capsule (0.4 mg total) by mouth daily after supper. 30 capsule 11   traMADol (ULTRAM) 50 MG tablet Take 1 tablet (50 mg total) by mouth every 6 (six) hours as needed. 20 tablet 0   No  current facility-administered medications for this visit.    ALLERGIES:  No Known Allergies  PHYSICAL EXAM:  Performance status (ECOG): 1 - Symptomatic but completely ambulatory  There were no vitals filed for this visit. Wt Readings from Last 3 Encounters:  07/17/21 178 lb (80.7 kg)  07/07/21 178 lb 8 oz (81 kg)  06/02/21 185 lb 14.4 oz (84.3 kg)   Physical Exam Vitals reviewed.  Constitutional:      Appearance: Normal appearance.  Cardiovascular:     Rate and Rhythm: Normal rate and regular rhythm.     Pulses: Normal pulses.     Heart sounds: Normal heart sounds.  Pulmonary:     Effort: Pulmonary effort is normal.     Breath sounds: Normal breath sounds.  Musculoskeletal:     Right lower leg: No edema.     Left lower leg: No edema.  Neurological:     General: No focal deficit present.     Mental Status: He is alert and oriented to person, place, and time.  Psychiatric:        Mood and Affect: Mood normal.        Behavior: Behavior normal.     LABORATORY DATA:  I have reviewed the labs as listed.     Latest Ref Rng & Units 07/07/2021   10:04 AM 05/26/2021    1:54 PM 05/25/2021    6:51 PM  CBC  WBC 4.0 - 10.5 K/uL 7.5   5.7   7.5    Hemoglobin 13.0 - 17.0 g/dL 13.5   10.9   11.3    Hematocrit 39.0 - 52.0 % 38.2   30.8   34.2    Platelets 150 - 400 K/uL 296    247   264        Latest Ref Rng & Units 07/07/2021   10:04 AM 05/26/2021    1:54 PM 05/25/2021    6:51 PM  CMP  Glucose 70 - 99 mg/dL 176   107   118    BUN 8 - 23 mg/dL 63   25   24    Creatinine 0.61 - 1.24 mg/dL 2.21   1.27   1.38    Sodium 135 - 145 mmol/L 127   134   135    Potassium 3.5 - 5.1 mmol/L 4.6   4.0   4.2    Chloride 98 - 111 mmol/L 101   108   106    CO2 22 - 32 mmol/L '18   21   21    '$ Calcium 8.9 - 10.3 mg/dL 9.3   9.1   9.2    Total Protein 6.5 - 8.1 g/dL 7.2   6.5     Total Bilirubin 0.3 - 1.2 mg/dL 0.9   0.9     Alkaline Phos 38 - 126 U/L 61   52     AST 15 - 41 U/L 22   17     ALT 0 - 44 U/L 20   14       DIAGNOSTIC IMAGING:  I have independently reviewed the scans and discussed with the patient. No results found.   ASSESSMENT:  1.  Metastatic prostate cancer to the right adrenal gland: -Presentation to the ER with worsening right-sided back pain, which has been on and off for the last couple of years. - Some decrease in appetite with 6 pound weight loss in the last 6 months.  No hematuria or other B symptoms. - CT  renal study on 05/03/2020 showed new right adrenal mass with bilateral renal lesions.  Indeterminate L2 vertebral bodies sclerotic lesion.  Low-attenuation lesions in the kidneys measure up to 2.3 cm on the right and were similar to scan from July 2021. - CT CAP on 06/12/2020 did not show any evidence of nodal metastasis or metastatic disease in the chest.  Right adrenal mass measures 4.2 x 3.8 cm exerts mass-effect upon the posterior and right lateral wall of the IVC.  Right kidney masses measuring 2.3 x 2.3 cm and 1.9 x 1.7 cm suspicious for RCC.  Upper pole of the left kidney lesion 2.2 x 2.0 cm and medial cortex of the inferior pole measuring 2 x 2 cm and another interpolar left kidney lesion measuring 1.3 x 1.5 cm. - Bone scan on 06/13/2020 was negative. - Right adrenal biopsy consistent with prostatic adenocarcinoma - Degarelix on 07/16/2020,  transition to Eligard 45 mg on 08/21/2020. - Abiraterone 100 mg daily/prednisone 5 mg daily started around 01/22/2021.,  Discontinued in April 2023.  Not a candidate for Erleada due to blood thinner. - Darolutamide 300 mg twice daily started on 06/19/2021, increased to 600 mg twice daily on 06/26/2021, cut back to 300 mg twice daily on 07/07/2021 due to decreased oral intake and tiredness.   2.  Social/family history: - He lives at home with his daughter.  He walks with a cane. - He mows lawn with a riding mower.  He has a garden and gross vegetables.  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  Quit smoking 25 years ago. - No family history of malignancies that he is aware of.   PLAN:  1.  Metastatic prostate cancer to the right adrenal gland: - He is tolerating darolutamide 300 mg twice daily much better. - He has been eating better and also feels better. - He has lower back pain which is intermittent. - Reviewed labs today which showed normal LFTs.  Creatinine is 1.85 and slightly better.  CBC was grossly normal.  PSA has improved to 1.73 from 5.97 on 05/26/2021. - Continue darolutamide 300 mg twice daily.  RTC 3 weeks for follow-up with labs. - As he is complaining of back pain, I will consider scheduling for scans (CTAP and bone scan) at next visit.    2.  Right sided back pain: - Continue tramadol as needed for back pain.  3.  Hypomagnesemia: - Continue magnesium 3 times daily.  Magnesium is normal today.  4.  Decreased appetite: - Continue Megace twice daily.  Weight improved by 5 pounds.   Orders placed this encounter:  No orders of the defined types were placed in this encounter.    Derek Jack, MD [place] [phone]   I, Thana Ates, am acting as a scribe for Dr. Derek Jack.  I, Derek Jack MD, have reviewed the above documentation for accuracy and completeness, and I agree with the above.

## 2021-07-21 NOTE — Progress Notes (Signed)
Patient is taking Nubeqa as prescribed.  He has not missed any doses and reports no side effects at this time.   

## 2021-07-22 ENCOUNTER — Other Ambulatory Visit (HOSPITAL_COMMUNITY): Payer: Self-pay

## 2021-07-22 ENCOUNTER — Encounter (HOSPITAL_COMMUNITY): Payer: Self-pay | Admitting: Hematology

## 2021-07-22 MED ORDER — OCTREOTIDE ACETATE 20 MG IM KIT
PACK | INTRAMUSCULAR | Status: AC
  Filled 2021-07-22: qty 1

## 2021-07-23 ENCOUNTER — Other Ambulatory Visit (HOSPITAL_COMMUNITY): Payer: Self-pay

## 2021-07-28 ENCOUNTER — Ambulatory Visit (INDEPENDENT_AMBULATORY_CARE_PROVIDER_SITE_OTHER): Payer: Medicare Other

## 2021-07-28 ENCOUNTER — Ambulatory Visit: Payer: Medicare Other | Admitting: Surgical

## 2021-07-28 DIAGNOSIS — M25512 Pain in left shoulder: Secondary | ICD-10-CM | POA: Diagnosis not present

## 2021-07-28 DIAGNOSIS — M7542 Impingement syndrome of left shoulder: Secondary | ICD-10-CM

## 2021-07-28 DIAGNOSIS — M7541 Impingement syndrome of right shoulder: Secondary | ICD-10-CM

## 2021-08-01 ENCOUNTER — Encounter: Payer: Self-pay | Admitting: Surgical

## 2021-08-01 ENCOUNTER — Inpatient Hospital Stay (HOSPITAL_COMMUNITY)
Admission: EM | Admit: 2021-08-01 | Discharge: 2021-08-03 | DRG: 378 | Disposition: A | Discharge status: Home / Self Care | Payer: Medicare Other | Attending: Internal Medicine | Admitting: Internal Medicine

## 2021-08-01 ENCOUNTER — Encounter (HOSPITAL_COMMUNITY): Payer: Self-pay | Admitting: Emergency Medicine

## 2021-08-01 ENCOUNTER — Telehealth (HOSPITAL_COMMUNITY): Payer: Self-pay | Admitting: *Deleted

## 2021-08-01 ENCOUNTER — Emergency Department (HOSPITAL_COMMUNITY): Payer: Medicare Other

## 2021-08-01 ENCOUNTER — Other Ambulatory Visit: Payer: Self-pay

## 2021-08-01 DIAGNOSIS — R54 Age-related physical debility: Secondary | ICD-10-CM | POA: Diagnosis present

## 2021-08-01 DIAGNOSIS — D539 Nutritional anemia, unspecified: Secondary | ICD-10-CM

## 2021-08-01 DIAGNOSIS — Z9049 Acquired absence of other specified parts of digestive tract: Secondary | ICD-10-CM

## 2021-08-01 DIAGNOSIS — Z85038 Personal history of other malignant neoplasm of large intestine: Secondary | ICD-10-CM

## 2021-08-01 DIAGNOSIS — K3182 Dieulafoy lesion (hemorrhagic) of stomach and duodenum: Principal | ICD-10-CM | POA: Diagnosis present

## 2021-08-01 DIAGNOSIS — K2981 Duodenitis with bleeding: Secondary | ICD-10-CM | POA: Diagnosis not present

## 2021-08-01 DIAGNOSIS — I482 Chronic atrial fibrillation, unspecified: Secondary | ICD-10-CM | POA: Diagnosis present

## 2021-08-01 DIAGNOSIS — Z950 Presence of cardiac pacemaker: Secondary | ICD-10-CM | POA: Diagnosis not present

## 2021-08-01 DIAGNOSIS — Z66 Do not resuscitate: Secondary | ICD-10-CM | POA: Diagnosis present

## 2021-08-01 DIAGNOSIS — K922 Gastrointestinal hemorrhage, unspecified: Secondary | ICD-10-CM | POA: Diagnosis present

## 2021-08-01 DIAGNOSIS — K3189 Other diseases of stomach and duodenum: Secondary | ICD-10-CM | POA: Diagnosis present

## 2021-08-01 DIAGNOSIS — E782 Mixed hyperlipidemia: Secondary | ICD-10-CM

## 2021-08-01 DIAGNOSIS — Z8546 Personal history of malignant neoplasm of prostate: Secondary | ICD-10-CM | POA: Diagnosis not present

## 2021-08-01 DIAGNOSIS — R109 Unspecified abdominal pain: Secondary | ICD-10-CM | POA: Diagnosis present

## 2021-08-01 DIAGNOSIS — E871 Hypo-osmolality and hyponatremia: Secondary | ICD-10-CM

## 2021-08-01 DIAGNOSIS — K219 Gastro-esophageal reflux disease without esophagitis: Secondary | ICD-10-CM | POA: Diagnosis present

## 2021-08-01 DIAGNOSIS — Z7952 Long term (current) use of systemic steroids: Secondary | ICD-10-CM

## 2021-08-01 DIAGNOSIS — I129 Hypertensive chronic kidney disease with stage 1 through stage 4 chronic kidney disease, or unspecified chronic kidney disease: Secondary | ICD-10-CM | POA: Diagnosis present

## 2021-08-01 DIAGNOSIS — Z7901 Long term (current) use of anticoagulants: Secondary | ICD-10-CM | POA: Diagnosis not present

## 2021-08-01 DIAGNOSIS — Z85528 Personal history of other malignant neoplasm of kidney: Secondary | ICD-10-CM

## 2021-08-01 DIAGNOSIS — N179 Acute kidney failure, unspecified: Secondary | ICD-10-CM | POA: Diagnosis present

## 2021-08-01 DIAGNOSIS — I10 Essential (primary) hypertension: Secondary | ICD-10-CM | POA: Diagnosis not present

## 2021-08-01 DIAGNOSIS — Z87891 Personal history of nicotine dependence: Secondary | ICD-10-CM | POA: Diagnosis not present

## 2021-08-01 DIAGNOSIS — C7971 Secondary malignant neoplasm of right adrenal gland: Secondary | ICD-10-CM | POA: Diagnosis present

## 2021-08-01 DIAGNOSIS — R001 Bradycardia, unspecified: Secondary | ICD-10-CM

## 2021-08-01 DIAGNOSIS — K2961 Other gastritis with bleeding: Secondary | ICD-10-CM | POA: Diagnosis not present

## 2021-08-01 DIAGNOSIS — Z79899 Other long term (current) drug therapy: Secondary | ICD-10-CM | POA: Diagnosis not present

## 2021-08-01 DIAGNOSIS — E755 Other lipid storage disorders: Secondary | ICD-10-CM | POA: Diagnosis present

## 2021-08-01 DIAGNOSIS — Z8679 Personal history of other diseases of the circulatory system: Secondary | ICD-10-CM

## 2021-08-01 DIAGNOSIS — I480 Paroxysmal atrial fibrillation: Secondary | ICD-10-CM | POA: Diagnosis present

## 2021-08-01 DIAGNOSIS — N1832 Chronic kidney disease, stage 3b: Secondary | ICD-10-CM | POA: Diagnosis present

## 2021-08-01 HISTORY — DX: Hypo-osmolality and hyponatremia: E87.1

## 2021-08-01 LAB — COMPREHENSIVE METABOLIC PANEL
ALT: 28 U/L (ref 0–44)
AST: 25 U/L (ref 15–41)
Albumin: 3.8 g/dL (ref 3.5–5.0)
Alkaline Phosphatase: 59 U/L (ref 38–126)
Anion gap: 8 (ref 5–15)
BUN: 45 mg/dL — ABNORMAL HIGH (ref 8–23)
CO2: 23 mmol/L (ref 22–32)
Calcium: 9.3 mg/dL (ref 8.9–10.3)
Chloride: 96 mmol/L — ABNORMAL LOW (ref 98–111)
Creatinine, Ser: 1.75 mg/dL — ABNORMAL HIGH (ref 0.61–1.24)
GFR, Estimated: 36 mL/min — ABNORMAL LOW (ref >60)
Glucose, Bld: 114 mg/dL — ABNORMAL HIGH (ref 70–99)
Potassium: 5 mmol/L (ref 3.5–5.1)
Sodium: 127 mmol/L — ABNORMAL LOW (ref 135–145)
Total Bilirubin: 0.7 mg/dL (ref 0.3–1.2)
Total Protein: 7.4 g/dL (ref 6.5–8.1)

## 2021-08-01 LAB — URINALYSIS, ROUTINE W REFLEX MICROSCOPIC
Bacteria, UA: NONE SEEN
Bilirubin Urine: NEGATIVE
Glucose, UA: NEGATIVE mg/dL
Hgb urine dipstick: NEGATIVE
Ketones, ur: NEGATIVE mg/dL
Leukocytes,Ua: NEGATIVE
Nitrite: NEGATIVE
Protein, ur: 30 mg/dL — AB
Specific Gravity, Urine: 1.01 (ref 1.005–1.030)
pH: 6 (ref 5.0–8.0)

## 2021-08-01 LAB — CBC
HCT: 35.7 % — ABNORMAL LOW (ref 39.0–52.0)
Hemoglobin: 12.5 g/dL — ABNORMAL LOW (ref 13.0–17.0)
MCH: 35 pg — ABNORMAL HIGH (ref 26.0–34.0)
MCHC: 35 g/dL (ref 30.0–36.0)
MCV: 100 fL (ref 80.0–100.0)
Platelets: 336 10*3/uL (ref 150–400)
RBC: 3.57 MIL/uL — ABNORMAL LOW (ref 4.22–5.81)
RDW: 12 % (ref 11.5–15.5)
WBC: 6.5 10*3/uL (ref 4.0–10.5)
nRBC: 0.3 % — ABNORMAL HIGH (ref 0.0–0.2)

## 2021-08-01 LAB — POC OCCULT BLOOD, ED: Fecal Occult Bld: POSITIVE — AB

## 2021-08-01 LAB — LIPASE, BLOOD: Lipase: 48 U/L (ref 11–51)

## 2021-08-01 MED ORDER — ONDANSETRON HCL 4 MG/2ML IJ SOLN: 4.0000 mg | Freq: Four times a day (QID) | INTRAMUSCULAR | Status: DC | PRN

## 2021-08-01 MED ORDER — CHLORHEXIDINE GLUCONATE CLOTH 2 % EX PADS
6.0000 | MEDICATED_PAD | Freq: Every day | CUTANEOUS | Status: DC
  Administered 2021-08-02: 6 via TOPICAL

## 2021-08-01 MED ORDER — LIDOCAINE HCL 1 % IJ SOLN
5.0000 mL | INTRAMUSCULAR | Status: AC | PRN
  Administered 2021-07-28: 5 mL

## 2021-08-01 MED ORDER — PANTOPRAZOLE SODIUM 40 MG IV SOLR
40.0000 mg | Freq: Once | INTRAVENOUS | Status: AC
Start: 2021-08-01 — End: 2021-08-01
  Administered 2021-08-01: 40 mg via INTRAVENOUS
  Filled 2021-08-01: qty 10

## 2021-08-01 MED ORDER — METHYLPREDNISOLONE ACETATE 40 MG/ML IJ SUSP
40.0000 mg | INTRAMUSCULAR | Status: AC | PRN
  Administered 2021-07-28: 40 mg via INTRA_ARTICULAR

## 2021-08-01 MED ORDER — BUPIVACAINE HCL 0.25 % IJ SOLN
9.0000 mL | INTRAMUSCULAR | Status: AC | PRN
  Administered 2021-07-28: 9 mL via INTRA_ARTICULAR

## 2021-08-01 MED ORDER — HYDRALAZINE HCL 20 MG/ML IJ SOLN
10.0000 mg | Freq: Four times a day (QID) | INTRAMUSCULAR | Status: DC | PRN
  Administered 2021-08-03: 10 mg via INTRAVENOUS
  Filled 2021-08-01: qty 1

## 2021-08-01 MED ORDER — METOPROLOL TARTRATE 5 MG/5ML IV SOLN: 2.5000 mg | Freq: Four times a day (QID) | INTRAVENOUS | Status: DC | PRN

## 2021-08-01 MED ORDER — MORPHINE SULFATE (PF) 2 MG/ML IV SOLN: 2.0000 mg | INTRAVENOUS | Status: DC | PRN

## 2021-08-01 MED ORDER — IOHEXOL 300 MG/ML  SOLN
75.0000 mL | Freq: Once | INTRAMUSCULAR | Status: AC | PRN
  Administered 2021-08-01: 75 mL via INTRAVENOUS

## 2021-08-01 MED ORDER — ONDANSETRON HCL 4 MG PO TABS: 4.0000 mg | ORAL_TABLET | Freq: Four times a day (QID) | ORAL | Status: DC | PRN

## 2021-08-01 MED ORDER — PANTOPRAZOLE SODIUM 40 MG IV SOLR
40.0000 mg | Freq: Two times a day (BID) | INTRAVENOUS | Status: DC
  Administered 2021-08-01 – 2021-08-03 (×4): 40 mg via INTRAVENOUS
  Filled 2021-08-01 (×4): qty 10

## 2021-08-01 MED ORDER — SODIUM CHLORIDE 0.9 % IV SOLN: INTRAVENOUS | Status: AC

## 2021-08-01 NOTE — ED Provider Notes (Incomplete)
[facility] Provider Note   CSN: [id number] Arrival date & time: 08/01/21  1248     History {Add pertinent medical, surgical, social history, OB history to HPI:1} Chief Complaint  Patient presents with   Melena   Abdominal Pain    Derrick Long is a 88 y.o. male.  Patient complains of abdominal pain and having black stools x4 over the last couple days.  Patient has history of atrial fibs and is on Xarelto.  He also has a history of prostate cancer   Abdominal Pain      Home Medications Prior to Admission medications   Medication Sig Start Date End Date Taking? Authorizing Provider  acetaminophen (TYLENOL) 650 MG CR tablet Take 1,300 mg by mouth every 8 (eight) hours as needed for pain.   Yes [provider]  ascorbic acid (VITAMIN C) 250 MG tablet Take 250 mg by mouth 3 (three) times daily. Take with Iron (Ferrous Sulfate) to promote better absorption 03/18/21  Yes [provider]  atorvastatin (LIPITOR) 20 MG tablet Take 1 tablet (20 mg total) by mouth daily at 6 PM. 07/23/12  Yes Lyda Jester M, PA-C  carvedilol (COREG) 3.125 MG tablet Take 1 tablet (3.125 mg total) by mouth 2 (two) times daily. 03/28/21 05/15/23 Yes Antonieta Pert, MD  Cholecalciferol 25 MCG (1000 UT) tablet Take 1,000 Units by mouth daily.   Yes [provider]  clotrimazole-betamethasone (LOTRISONE) cream Apply 1 application. topically 2 (two) times daily. 05/21/21  Yes McKenzie, Candee Furbish, MD  darolutamide (NUBEQA) 300 MG tablet Take 1 tablet (300 mg) by mouth twice daily for 2 weeks, then increase to 2 tablets (600 mg total) by mouth twice daily. Take with food. 06/30/21  Yes Derek Jack, MD  diclofenac sodium (VOLTAREN) 1 % GEL Apply 2 g topically 4 (four) times daily. Patient taking differently: Apply 2 g topically 4 (four) times daily as needed (pain). 06/19/17  Yes Rancour, Annie Main, MD  Ensure (ENSURE) Take 237 mLs by mouth daily as needed  (nutrition). Vanilla or chocolate   Yes [provider]  ferrous sulfate 324 MG TBEC Take 324 mg by mouth 2 (two) times daily.   Yes [provider]  fluticasone (FLONASE) 50 MCG/ACT nasal spray Place 2 sprays into both nostrils daily. Patient taking differently: Place 2 sprays into both nostrils daily as needed for allergies. 02/15/16  Yes Ward, Delice Bison, DO  furosemide (LASIX) 40 MG tablet Take 40 mg by mouth daily. 03/18/21  Yes [provider]  magnesium oxide (MAG-OX) 400 (240 Mg) MG tablet Take 1 tablet (400 mg total) by mouth 3 (three) times daily. 06/02/21  Yes Derek Jack, MD  megestrol (MEGACE) 400 MG/10ML suspension Take 10 mLs (400 mg total) by mouth 2 (two) times daily. 03/20/21  Yes Derek Jack, MD  pantoprazole (PROTONIX) 40 MG tablet TAKE 1 TABLET BY MOUTH ONCE DAILY. Patient taking differently: Take 40 mg by mouth daily. 07/22/21  Yes Derek Jack, MD  potassium chloride SA (KLOR-CON) 20 MEQ tablet Take 20 mEq by mouth daily. 02/28/20  Yes [provider]  predniSONE (DELTASONE) 5 MG tablet Take 5 mg by mouth every morning. Continuous 06/12/21  Yes [provider]  rivaroxaban (XARELTO) 20 MG TABS tablet Take 1 tablet (20 mg total) by mouth daily with supper. 10/30/13  Yes Croitoru, Mihai, MD  tamsulosin (FLOMAX) 0.4 MG CAPS capsule Take 1 capsule (0.4 mg total) by mouth daily after supper. 05/07/21  Yes McKenzie, Candee Furbish,  MD  traMADol (ULTRAM) 50 MG tablet Take 1 tablet (50 mg total) by mouth every 6 (six) hours as needed. Patient taking differently: Take 50 mg by mouth every 6 (six) hours as needed for severe pain. 05/24/21  Yes Daleen Bo, MD      Allergies    Patient has no known allergies.    Review of Systems   Review of Systems  Gastrointestinal:  Positive for abdominal pain.    Physical Exam Updated Vital Signs BP (!) 169/86   Pulse 69   Temp 97.9 F (36.6 C) (Oral)   Resp 18   Ht '6\' 1"'$  (1.854  m)   Wt 84.8 kg   SpO2 99%   BMI 24.67 kg/m  Physical Exam  ED Results / Procedures / Treatments   Labs (all labs ordered are listed, but only abnormal results are displayed) Labs Reviewed  COMPREHENSIVE METABOLIC PANEL - Abnormal; Notable for the following components:      Result Value   Sodium 127 (*)    Chloride 96 (*)    Glucose, Bld 114 (*)    BUN 45 (*)    Creatinine, Ser 1.75 (*)    GFR, Estimated 36 (*)    All other components within normal limits  CBC - Abnormal; Notable for the following components:   RBC 3.57 (*)    Hemoglobin 12.5 (*)    HCT 35.7 (*)    MCH 35.0 (*)    nRBC 0.3 (*)    All other components within normal limits  URINALYSIS, ROUTINE W REFLEX MICROSCOPIC - Abnormal; Notable for the following components:   Color, Urine STRAW (*)    Protein, ur 30 (*)    All other components within normal limits  POC OCCULT BLOOD, ED - Abnormal; Notable for the following components:   Fecal Occult Bld POSITIVE (*)    All other components within normal limits  LIPASE, BLOOD    EKG None  Radiology CT ABDOMEN PELVIS W CONTRAST  Result Date: 08/01/2021 CLINICAL DATA:  Acute nonlocalized abdominal pain. Black stools and weakness. Abdominal pain for the last several days. EXAM: CT ABDOMEN AND PELVIS WITH CONTRAST TECHNIQUE: Multidetector CT imaging of the abdomen and pelvis was performed using the standard protocol following bolus administration of intravenous contrast. RADIATION DOSE REDUCTION: This exam was performed according to the departmental dose-optimization program which includes automated exposure control, adjustment of the mA and/or kV according to patient size and/or use of iterative reconstruction technique. CONTRAST:  85m OMNIPAQUE IOHEXOL 300 MG/ML  SOLN COMPARISON:  CT abdomen pelvis without contrast 02/19/2021, CT abdomen and pelvis with contrast 10/25/2020 FINDINGS: Lower chest: Minimal curvilinear scarring versus subsegmental atelectasis within the  inferior aspect of the middle lobe unchanged. Mild subsegmental atelectasis within the posterior left lower lobe. Heart size is mildly enlarged. Cardiac pacer wires are noted. Unchanged 4 mm anterolateral right lower lobe nodule compared to 02/19/2021. Hepatobiliary: Smooth liver contours. No focal liver mass is identified. Status post cholecystectomy. Mild central intrahepatic biliary ductal dilatation appears unchanged likely postsurgical. The common bile duct measures up to approximately 7 mm, unchanged from prior and within normal limits given post cholecystectomy and patient age. Pancreas: No mass or inflammatory fat stranding. No pancreatic ductal dilatation is seen. Spleen: Normal in size without focal abnormality. Adrenals/Urinary Tract: Unchanged right adrenal nodule measuring up to approximately 11 x 19 mm (axial image 21). Normal left adrenal gland. There are numerous bilateral renal lesions. Some of these demonstrate fluid density suggesting cysts. There  is an apparent partially enhancing mass extending exophytically from the lower pole of the right kidney measuring up to 2.3 x 1.9 by 2.7 cm (axial image 32 and coronal image 45) concerning for enhancing mass of renal cell carcinoma. This is a similar size 04/01/2021 noncontrast CT when this measured 17 Hounsfield units centrally. Within the far inferior aspect of the lower pole there is an exophytic 2.0 cm centrally low dense lesion and peripherally enhancing lesion that may represent a cyst versus neoplasm. This measures 33 Hounsfield units centrally. Within the upper pole of the left kidney there is a partially enhancing mass measuring 2.2 x 2.6 x 2.3 cm (axial image 18 and coronal image 46). This is also suspicious for a solid enhancing mass of renal cell carcinoma. This is not significant changed in size from 02/19/2021 noncontrast CT when the central aspect measured 17 Hounsfield units. No focal urinary bladder wall thickening. Stomach/Bowel: A  diverticulum is seen within the sigmoid colon without inflammatory change. Moderate to high-grade stool is seen throughout the colon. Surgical suture is seen in the region of the proximal ascending colon, an ileocolic anastomosis. No dilated loops of bowel to indicate bowel obstruction. Vascular/Lymphatic: No abdominal aortic aneurysm. Moderate to high-grade atherosclerotic calcifications. The right common iliac artery is distended up to 2.0 cm in the left common iliac artery is distended up to 1.8 cm. No mesenteric, retroperitoneal, or pelvic lymphadenopathy. Reproductive: The prostate gland is again enlarged and contains brachytherapy seeds. The seminal vesicles are grossly unremarkable. Other: Small fat containing umbilical hernia.No abdominopelvic ascites. No pneumoperitoneum. Musculoskeletal: Mild levocurvature of the mid to upper lumbar spine. Severe right L2-3 disc space narrowing and endplate degenerative changes. IMPRESSION: Compared to 02/19/2021: 1. No CT explanation for the patient's abdominal pain. 2. Bilateral enhancing renal masses again suspicious for renal cell carcinoma. 3. Postsurgical changes of prior right hemicolectomy and ileocolic anastomosis. No bowel obstruction. 4. Additional chronic findings as above. Electronically Signed   By: Yvonne Kendall M.D.   On: 08/01/2021 18:54    Procedures Procedures  {Document cardiac monitor, telemetry assessment procedure when appropriate:1}  Medications Ordered in ED Medications  pantoprazole (PROTONIX) injection 40 mg (40 mg Intravenous Given 08/01/21 1742)  iohexol (OMNIPAQUE) 300 MG/ML solution 75 mL (75 mLs Intravenous Contrast Given 08/01/21 1806)    ED Course/ Medical Decision Making/ A&P  CRITICAL CARE Performed by: Milton Ferguson Total critical care time: 40 minutes Critical care time was exclusive of separately billable procedures and treating other patients. Critical care was necessary to treat or prevent imminent or life-threatening  deterioration. Critical care was time spent personally by me on the following activities: development of treatment plan with patient and/or surrogate as well as nursing, discussions with consultants, evaluation of patient's response to treatment, examination of patient, obtaining history from patient or surrogate, ordering and performing treatments and interventions, ordering and review of laboratory studies, ordering and review of radiographic studies, pulse oximetry and re-evaluation of patient's condition.   Patient with black stools and upper GI bleed.  I spoke with Dr. Gala Romney gastroenterology and he recommended the patient be admitted to the hospital and made n.p.o. after midnight.  He also recommended stopping Xarelto                         Medical Decision Making Amount and/or Complexity of Data Reviewed Labs: ordered. Radiology: ordered.  Risk Prescription drug management. Decision regarding hospitalization.   Patient with upper GI bleed.  Most  likely related to Xarelto he is taking.  We will stop his Xarelto and he will be admitted to medicine with GI consult tomorrow possible endoscopy  {Document critical care time when appropriate:1} {Document review of labs and clinical decision tools ie heart score, Chads2Vasc2 etc:1}  {Document your independent review of radiology images, and any outside records:1} {Document your discussion with family members, caretakers, and with consultants:1} {Document social determinants of health affecting pt's care:1} {Document your decision making why or why not admission, treatments were needed:1} Final Clinical Impression(s) / ED Diagnoses Final diagnoses:  Acute upper GI bleed    Rx / DC Orders ED Discharge Orders     None

## 2021-08-01 NOTE — Progress Notes (Signed)
Office Visit Note   Patient: Derrick Long           Date of Birth: 1926-10-31           MRN: [id number] Visit Date: 07/28/2021 Requested by: Center, [facility] [address] [place],  [place] [address] PCP: [place]  Subjective: Chief Complaint  Patient presents with   Left Shoulder - Pain   Right Shoulder - Pain    HPI: DETAVIOUS Long is a 88 y.o. male who presents to the office complaining of bilateral shoulder pain.  Patient had a fall several months ago.  He states that since the fall he has had bilateral shoulder pain with left bothering him more than the right.  He states that sometimes he has to assist with raising his arm.  He is right-hand dominant.  No history of prior surgery or dislocation.  Most of his pain is in the anterior lateral shoulder and both shoulders.  Pain does not wake him up at night.  He does have a history of using tramadol for pain.  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  Denies any numbness or tingling, neck pain, scapular pain, radicular pain.  Pain will occasionally radiate down to the bicep.  .                ROS: All systems reviewed are negative as they relate to the chief complaint within the history of present illness.  Patient denies fevers or chills.  Assessment & Plan: Visit Diagnoses:  1. Left shoulder pain, unspecified chronicity     Plan:.  Patient is a 88 year old male who presents for evaluation of left shoulder and right shoulder pain.  He has had pain for several months without improvement.  Pain does not really wake him up at night.  Not really a lot of rotator cuff weakness on exam today.  Does have positive impingement signs.  After discussion of options, he would like to try injection in both shoulders.  No history of diabetes.  Both shoulders injected and patient tolerated the procedure well.  Plan to follow-up with the office in 6 weeks if he is still having symptoms but if he  feels back to normal, he may cancel that appointment.  Patient agreed with plan.  Follow-Up Instructions: No follow-ups on file.   Orders:  Orders Placed This Encounter  Procedures   XR Shoulder Left   No orders of the defined types were placed in this encounter.     Procedures: Large Joint Inj: bilateral subacromial bursa on 07/28/2021 12:32 PM Indications: diagnostic evaluation and pain Details: 18 G 1.5 in needle, posterior approach  Arthrogram: No  Medications (Right): 5 mL lidocaine 1 %; 9 mL bupivacaine 0.25 %; 40 mg methylPREDNISolone acetate 40 MG/ML Medications (Left): 5 mL lidocaine 1 %; 9 mL bupivacaine 0.25 %; 40 mg methylPREDNISolone acetate 40 MG/ML Outcome: tolerated well, no immediate complications Procedure, treatment alternatives, risks and benefits explained, specific risks discussed. Consent was given by the patient. Immediately prior to procedure a time out was called to verify the correct patient, procedure, equipment, support staff and site/side marked as required. Patient was prepped and draped in the usual sterile fashion.       Clinical Data: No additional findings.  Objective: Vital Signs: There were no vitals taken for this visit.  Physical Exam:  Constitutional: Patient appears well-developed HEENT:  Head: Normocephalic Eyes:EOM are normal Neck: Normal range of  motion Cardiovascular: Normal rate Pulmonary/chest: Effort normal Neurologic: Patient is alert Skin: Skin is warm Psychiatric: Patient has normal mood and affect  Ortho Exam: Ortho exam demonstrates right shoulder with 90 degrees external rotation, 90 degrees abduction, 140 degrees forward flexion.  This compared with the left shoulder with 80 degrees external rotation, 90 degrees abduction, 120 degrees forward flexion.  Not much pain with passive motion of either shoulder until terminal forward flexion and external rotation.  Positive Neer sign.  Positive Hawkins sign.  5 -/5 supra,  infra, subscap strength of both shoulders.  5/5 motor strength of bilateral grip strength, finger abduction, pronation/supination, bicep, tricep, deltoid.  No tenderness over the axial cervical spine.  Negative Spurling sign.  Negative Lhermitte sign.  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  Specialty Comments:  No specialty comments available.  Imaging: No results found.   PMFS History: Patient Active Problem List   Diagnosis Date Noted   Acute kidney injury superimposed on chronic kidney disease ([place]) 03/27/2021   UTI (urinary tract infection) 03/26/2021   DNR (do not resuscitate) 03/26/2021   CKD (chronic kidney disease), stage III ([place]) 03/26/2021   Elevated bilirubin    Elevated LFTs    RUQ pain    Intractable abdominal pain 02/03/2021   Prostate cancer ([place]) 07/10/2020   Bilateral kidney masses 06/06/2020   Adrenal mass, right ([place]) 06/06/2020   Acute colitis 09/16/2019   Abdominal pain 09/16/2019   Nausea 09/16/2019   Hypoalbuminemia 09/16/2019   Pelvic lymphadenopathy 09/16/2019   History of atrial fibrillation 09/16/2019   Colitis 09/16/2019   Paroxysmal atrial fibrillation (HCC) 09/02/2012   HTN (hypertension) 09/02/2012   Pacemaker 08/03/2012   Chest pain 07/31/2012   Leukocytosis, unspecified 07/31/2012   AKI (acute kidney injury) ([place]) 07/31/2012   Gout 07/31/2012   First degree AV block, PR interval 370 ms 07/19/2012   Chest pain, atypical 07/19/2012   Acute renal insufficiency 07/19/2012   Hypokalemia 07/19/2012   Syncope 07/19/2012   Bradycardia 07/19/2012   LUMBAR STRAIN 09/10/2008   ANEMIA, IRON DEFICIENCY NOS 08/16/2008   OSTEOARTHRITIS, MODERATE 09/21/2007   INSOMNIA 06/24/2007   Dyslipidemia 04/05/2007   DENTAL CARIES 10/08/2006   LATERAL MENISCUS TEAR, RIGHT 04/19/2006   MIGRAINE HEADACHE 12/18/2005   Essential hypertension 12/18/2005   GERD 12/18/2005   RENAL CALCULUS 12/18/2005   BENIGN PROSTATIC HYPERTROPHY, WITH OBSTRUCTION  12/18/2005   ARTHRITIS 12/18/2005   LOW BACK PAIN 12/18/2005   OSTEOPENIA 12/18/2005   COLON CANCER, HX OF 12/18/2005   PROSTATE CANCER, HX OF 12/18/2005   Past Medical History:  Diagnosis Date   Acid reflux    Anemia    Colon cancer ([place])    per patient, colon cancer more than 10 years ago s/p right hemicolectomy   Ex-cigarette smoker    Gout    Hyperlipemia    Hypertension    Paroxysmal atrial fibrillation ([place]) 2014   Prostate cancer ([place]) 08/08/2013   SEEDS 20 YEARS AGO   Prostate disease     Family History  Problem Relation Age of Onset   Cancer Neg Hx     Past Surgical History:  Procedure Laterality Date   BIOPSY  02/05/2021   Procedure: BIOPSY;  Surgeon: Harvel Quale, MD;  [facility];  Service: Gastroenterology;;   COLON SURGERY     Right hemicolectomy in the setting of colon cancer, per patient   CYSTOSCOPY WITH INSERTION OF UROLIFT N/A 05/19/2021   Procedure: CYSTOSCOPY WITH INSERTION OF UROLIFT;  Surgeon: Cleon Gustin, MD;  Location: [facility] ORS;  Service: Urology;  Laterality: N/A;   ESOPHAGOGASTRODUODENOSCOPY (EGD) WITH PROPOFOL N/A 02/05/2021   Procedure: ESOPHAGOGASTRODUODENOSCOPY (EGD) WITH PROPOFOL;  Surgeon: Harvel Quale, MD;  Location: [facility];  Service: Gastroenterology;  Laterality: N/A;   LEFT HEART CATHETERIZATION WITH CORONARY ANGIOGRAM N/A 07/21/2012   Procedure: LEFT HEART CATHETERIZATION WITH CORONARY ANGIOGRAM;  Surgeon: Troy Sine, MD;  Location: [facility] CATH LAB;  Service: Cardiovascular;  Laterality: N/A;   PACEMAKER INSERTION     PERMANENT PACEMAKER INSERTION N/A 07/22/2012   Procedure: PERMANENT PACEMAKER INSERTION;  Surgeon: Sanda Klein, MD;  Location: [facility] CATH LAB;  Service: Cardiovascular;  Laterality: N/A;   PROSTATE SURGERY     Social History   Occupational History   Not on file  Tobacco Use   Smoking status: Former    Types: Cigarettes    Quit date: 07/20/2002    Years since quitting:  19.0   Smokeless tobacco: Never  Vaping Use   Vaping Use: Never used  Substance and Sexual Activity   Alcohol use: No   Drug use: No   Sexual activity: Not on file

## 2021-08-01 NOTE — ED Triage Notes (Signed)
Pt to Er with c/o black stools, weakness, and abdominal pain for the last several days.  Pt denies n/v, diarrhea.  Pt denies starting any new medications, pt does take blood thinners.

## 2021-08-01 NOTE — H&P (Signed)
History and Physical    Patient: Derrick Long WIO:[id number] DOB: May 15, 1926 DOA: 08/01/2021 DOS: the patient was seen and examined on 08/01/2021 PCP: [facility], [place]  Patient coming from: Home  Chief Complaint:  Chief Complaint  Patient presents with   Melena   Abdominal Pain   HPI: Derrick Long is a 88 y.o. 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 Delton Coombes) who presents to the emergency department due to 2-day onset of having black tarry stools, abdominal pain, bloating and weakness.  Abdominal pain was described as soreness feeling in the periumbilical area.  She lives with daughter and daughter called patient's physician and was asked to go to the ED for further evaluation and management.  He denies use of NSAIDs or any other new medication.  Patient denies fever, chills, nausea, vomiting, diarrhea.  ED Course:  In the emergency department, BP was 190/81 and other vital signs were within normal range.  Work-up in the ED showed macrocytic anemia, BMP showed hyponatremia, BUN/creatinine 45/1.74 (baseline creatinine at 1.3-1.4).  FOBT was positive, urinalysis was unimpressive for UTI, lipase 48. CT abdomen and pelvis with contrast showed no CT explanation for the patient's abdominal pain. 2. Bilateral enhancing renal masses again suspicious for renal cell carcinoma. IV Protonix was given.  Gastroenterologist (Dr. Gala Romney) was consulted and recommended admitting the patient with plan to see patient in the morning.  Hospitalist was asked to admit patient for further evaluation and management.  Review of Systems: Review of systems as noted in the HPI. All other systems reviewed and are negative.   Past Medical History:  Diagnosis Date   Acid reflux    Anemia    Colon cancer ([place])    per patient, colon cancer more than 10 years ago s/p right  hemicolectomy   Ex-cigarette smoker    Gout    Hyperlipemia    Hypertension    Paroxysmal atrial fibrillation ([place]) 2014   Prostate cancer ([place]) 08/08/2013   SEEDS 20 YEARS AGO   Prostate disease    Past Surgical History:  Procedure Laterality Date   BIOPSY  02/05/2021   Procedure: BIOPSY;  Surgeon: Harvel Quale, MD;  [facility];  Service: Gastroenterology;;   COLON SURGERY     Right hemicolectomy in the setting of colon cancer, per patient   CYSTOSCOPY WITH INSERTION OF UROLIFT N/A 05/19/2021   Procedure: CYSTOSCOPY WITH INSERTION OF UROLIFT;  Surgeon: Cleon Gustin, MD;  Location: [facility];  Service: Urology;  Laterality: N/A;   ESOPHAGOGASTRODUODENOSCOPY (EGD) WITH PROPOFOL N/A 02/05/2021   Procedure: ESOPHAGOGASTRODUODENOSCOPY (EGD) WITH PROPOFOL;  Surgeon: Harvel Quale, MD;  [facility];  Service: Gastroenterology;  Laterality: N/A;   LEFT HEART CATHETERIZATION WITH CORONARY ANGIOGRAM N/A 07/21/2012   Procedure: LEFT HEART CATHETERIZATION WITH CORONARY ANGIOGRAM;  Surgeon: Troy Sine, MD;  Location[facility] C[facility] LAB;  Service: Cardiovascular;  Laterality: N/A;   PACEMAKER INSERTION     PERMANENT PACEMAKER INSERTION N/A 07/22/2012   Procedure: PERMANENT PACEMAKER INSERTION;  Surgeon: Sanda Klein, MD;  Location: [facility] CATH LAB;  Service: Cardiovascular;  Laterality: N/A;   PROSTATE SURGERY      Social History:  reports that he quit smoking about 19 years ago. He has never used smokeless tobacco. He reports that he does not drink alcohol and does not use drugs.   No Known Allergies  Family History  Problem  Relation Age of Onset   Cancer Neg Hx      Prior to Admission medications   Medication Sig Start Date End Date Taking? Authorizing Provider  acetaminophen (TYLENOL) 650 MG CR tablet Take 1,300 mg by mouth every 8 (eight) hours as needed for pain.   Yes [provider]  ascorbic acid (VITAMIN C) 250 MG tablet  Take 250 mg by mouth 3 (three) times daily. Take with Iron (Ferrous Sulfate) to promote better absorption 03/18/21  Yes [provider]  atorvastatin (LIPITOR) 20 MG tablet Take 1 tablet (20 mg total) by mouth daily at 6 PM. 07/23/12  Yes Lyda Jester M, PA-C  carvedilol (COREG) 3.125 MG tablet Take 1 tablet (3.125 mg total) by mouth 2 (two) times daily. 03/28/21 05/15/23 Yes Antonieta Pert, MD  Cholecalciferol 25 MCG (1000 UT) tablet Take 1,000 Units by mouth daily.   Yes [provider]  clotrimazole-betamethasone (LOTRISONE) cream Apply 1 application. topically 2 (two) times daily. 05/21/21  Yes McKenzie, Candee Furbish, MD  darolutamide (NUBEQA) 300 MG tablet Take 1 tablet (300 mg) by mouth twice daily for 2 weeks, then increase to 2 tablets (600 mg total) by mouth twice daily. Take with food. 06/30/21  Yes Derek Jack, MD  diclofenac sodium (VOLTAREN) 1 % GEL Apply 2 g topically 4 (four) times daily. Patient taking differently: Apply 2 g topically 4 (four) times daily as needed (pain). 06/19/17  Yes Rancour, Annie Main, MD  Ensure (ENSURE) Take 237 mLs by mouth daily as needed (nutrition). Vanilla or chocolate   Yes [provider]  ferrous sulfate 324 MG TBEC Take 324 mg by mouth 2 (two) times daily.   Yes [provider]  fluticasone (FLONASE) 50 MCG/ACT nasal spray Place 2 sprays into both nostrils daily. Patient taking differently: Place 2 sprays into both nostrils daily as needed for allergies. 02/15/16  Yes Ward, Delice Bison, DO  furosemide (LASIX) 40 MG tablet Take 40 mg by mouth daily. 03/18/21  Yes [provider]  magnesium oxide (MAG-OX) 400 (240 Mg) MG tablet Take 1 tablet (400 mg total) by mouth 3 (three) times daily. 06/02/21  Yes Derek Jack, MD  megestrol (MEGACE) 400 MG/10ML suspension Take 10 mLs (400 mg total) by mouth 2 (two) times daily. 03/20/21  Yes Derek Jack, MD  pantoprazole (PROTONIX) 40 MG tablet TAKE 1 TABLET BY MOUTH  ONCE DAILY. Patient taking differently: Take 40 mg by mouth daily. 07/22/21  Yes Derek Jack, MD  potassium chloride SA (KLOR-CON) 20 MEQ tablet Take 20 mEq by mouth daily. 02/28/20  Yes [provider]  predniSONE (DELTASONE) 5 MG tablet Take 5 mg by mouth every morning. Continuous 06/12/21  Yes [provider]  rivaroxaban (XARELTO) 20 MG TABS tablet Take 1 tablet (20 mg total) by mouth daily with supper. 10/30/13  Yes Croitoru, Mihai, MD  tamsulosin (FLOMAX) 0.4 MG CAPS capsule Take 1 capsule (0.4 mg total) by mouth daily after supper. 05/07/21  Yes McKenzie, Candee Furbish, MD  traMADol (ULTRAM) 50 MG tablet Take 1 tablet (50 mg total) by mouth every 6 (six) hours as needed. Patient taking differently: Take 50 mg by mouth every 6 (six) hours as needed for severe pain. 05/24/21  Yes Daleen Bo, MD    Physical Exam: BP (!) 169/86   Pulse 69   Temp 97.9 F (36.6 C) (Oral)   Resp 18   Ht '6\' 1"'$  (1.854 m)   Wt 84.8 kg   SpO2 99%   BMI 24.67  kg/m   General: 88 y.o. year-old male well developed well nourished in no acute distress.  Alert and oriented x3. HEENT: NCAT, EOMI Neck: Supple, trachea medial Cardiovascular: Regular rate and rhythm with no rubs or gallops.  No thyromegaly or JVD noted.  No lower extremity edema. 2/4 pulses in all 4 extremities. Respiratory: Clear to auscultation with no wheezes or rales. Good inspiratory effort. Abdomen: Soft, nontender nondistended with normal bowel sounds x4 quadrants. Muskuloskeletal: No cyanosis, clubbing or edema noted bilaterally Neuro: CN II-XII intact, strength 5/5 x 4, sensation, reflexes intact Skin: No ulcerative lesions noted or rashes Psychiatry: Judgement and insight appear normal. Mood is appropriate for condition and setting          Labs on Admission:  Basic Metabolic Panel: Recent Labs  Lab 08/01/21 1309  NA 127*  K 5.0  CL 96*  CO2 23  GLUCOSE 114*  BUN 45*  CREATININE 1.75*  CALCIUM 9.3    Liver Function Tests: Recent Labs  Lab 08/01/21 1309  AST 25  ALT 28  ALKPHOS 59  BILITOT 0.7  PROT 7.4  ALBUMIN 3.8   Recent Labs  Lab 08/01/21 1309  LIPASE 48   No results for input(s): "AMMONIA" in the last 168 hours. CBC: Recent Labs  Lab 08/01/21 1309  WBC 6.5  HGB 12.5*  HCT 35.7*  MCV 100.0  PLT 336   Cardiac Enzymes: No results for input(s): "CKTOTAL", "CKMB", "CKMBINDEX", "TROPONINI" in the last 168 hours.  BNP (last 3 results) No results for input(s): "BNP" in the last 8760 hours.  ProBNP (last 3 results) No results for input(s): "PROBNP" in the last 8760 hours.  CBG: No results for input(s): "GLUCAP" in the last 168 hours.  Radiological Exams on Admission: CT ABDOMEN PELVIS W CONTRAST  Result Date: 08/01/2021 CLINICAL DATA:  Acute nonlocalized abdominal pain. Black stools and weakness. Abdominal pain for the last several days. EXAM: CT ABDOMEN AND PELVIS WITH CONTRAST TECHNIQUE: Multidetector CT imaging of the abdomen and pelvis was performed using the standard protocol following bolus administration of intravenous contrast. RADIATION DOSE REDUCTION: This exam was performed according to the departmental dose-optimization program which includes automated exposure control, adjustment of the mA and/or kV according to patient size and/or use of iterative reconstruction technique. CONTRAST:  85m OMNIPAQUE IOHEXOL 300 MG/ML  SOLN COMPARISON:  CT abdomen pelvis without contrast 02/19/2021, CT abdomen and pelvis with contrast 10/25/2020 FINDINGS: Lower chest: Minimal curvilinear scarring versus subsegmental atelectasis within the inferior aspect of the middle lobe unchanged. Mild subsegmental atelectasis within the posterior left lower lobe. Heart size is mildly enlarged. Cardiac pacer wires are noted. Unchanged 4 mm anterolateral right lower lobe nodule compared to 02/19/2021. Hepatobiliary: Smooth liver contours. No focal liver mass is identified. Status post  cholecystectomy. Mild central intrahepatic biliary ductal dilatation appears unchanged likely postsurgical. The common bile duct measures up to approximately 7 mm, unchanged from prior and within normal limits given post cholecystectomy and patient age. Pancreas: No mass or inflammatory fat stranding. No pancreatic ductal dilatation is seen. Spleen: Normal in size without focal abnormality. Adrenals/Urinary Tract: Unchanged right adrenal nodule measuring up to approximately 11 x 19 mm (axial image 21). Normal left adrenal gland. There are numerous bilateral renal lesions. Some of these demonstrate fluid density suggesting cysts. There is an apparent partially enhancing mass extending exophytically from the lower pole of the right kidney measuring up to 2.3 x 1.9 by 2.7 cm (axial image 32 and coronal image 45) concerning for enhancing  mass of renal cell carcinoma. This is a similar size 04/01/2021 noncontrast CT when this measured 17 Hounsfield units centrally. Within the far inferior aspect of the lower pole there is an exophytic 2.0 cm centrally low dense lesion and peripherally enhancing lesion that may represent a cyst versus neoplasm. This measures 33 Hounsfield units centrally. Within the upper pole of the left kidney there is a partially enhancing mass measuring 2.2 x 2.6 x 2.3 cm (axial image 18 and coronal image 46). This is also suspicious for a solid enhancing mass of renal cell carcinoma. This is not significant changed in size from 02/19/2021 noncontrast CT when the central aspect measured 17 Hounsfield units. No focal urinary bladder wall thickening. Stomach/Bowel: A diverticulum is seen within the sigmoid colon without inflammatory change. Moderate to high-grade stool is seen throughout the colon. Surgical suture is seen in the region of the proximal ascending colon, an ileocolic anastomosis. No dilated loops of bowel to indicate bowel obstruction. Vascular/Lymphatic: No abdominal aortic aneurysm.  Moderate to high-grade atherosclerotic calcifications. The right common iliac artery is distended up to 2.0 cm in the left common iliac artery is distended up to 1.8 cm. No mesenteric, retroperitoneal, or pelvic lymphadenopathy. Reproductive: The prostate gland is again enlarged and contains brachytherapy seeds. The seminal vesicles are grossly unremarkable. Other: Small fat containing umbilical hernia.No abdominopelvic ascites. No pneumoperitoneum. Musculoskeletal: Mild levocurvature of the mid to upper lumbar spine. Severe right L2-3 disc space narrowing and endplate degenerative changes. IMPRESSION: Compared to 02/19/2021: 1. No CT explanation for the patient's abdominal pain. 2. Bilateral enhancing renal masses again suspicious for renal cell carcinoma. 3. Postsurgical changes of prior right hemicolectomy and ileocolic anastomosis. No bowel obstruction. 4. Additional chronic findings as above. Electronically Signed   By: Yvonne Kendall M.D.   On: 08/01/2021 18:54    EKG: I independently viewed the EKG done and my findings are as followed: EKG was not done in the ED  Assessment/Plan Present on Admission:  GI bleed  Abdominal pain  Essential hypertension  Principal Problem:   GI bleed Active Problems:   Essential hypertension   PROSTATE CANCER, HX OF   Abdominal pain   History of atrial fibrillation   Hyponatremia   Macrocytic anemia   Mixed hyperlipidemia  Acute GI bleed H/H= 12.5/35.7, this was 12.4/35.3 on 07/21/2021 Hemoccult was positive Continue IV Protonix 40 mg twice daily Gastroenterologist was called to follow-up with patient per ED physician  Abdominal pain Continue IV hydration Continue IV morphine 2 mg q.4h p.r.n. for moderate to severe pain Continue IV Zofran p.r.n.  Hyponatremia Na 127. Continue IV hydration Continue to monitor sodium levels with BMP  Macrocytic Anemia MCV 100.0, H/H12.5/35.7 Folate level and vitamin B12 levels will be checked  AKI on CKD stage  3B BUN/creatinine 45/1.74 (baseline creatinine at 1.3-1.4) Continue gentle hydration Renally adjust medications, avoid nephrotoxic agents/dehydration/hypotension  Essential hypertension Continue IV hydralazine 10 mg every 6 hours as needed for SBP > 170 Continue home meds when patient resumes oral intake (currently n.p.o.)  Mixed hyperlipidemia Continue home meds when patient resumes oral intake (currently n.p.o.)  Chronic atrial fibrillation Xarelto will be held at this time due to GI bleed Continue IV metoprolol 2.5 mg every 6 hours as needed for HR 110  Metastatic prostate cancer to the right adrenal gland Prior renal carcinoma metastatic to right adrenal gland Patient follows with Dr. Delton Coombes  DVT prophylaxis: SCDs  Code Status: DNR  Consults: Gastroenterology  Family Communication: Son at bedside (all  questions answered to satisfaction)  Severity of Illness: The appropriate patient status for this patient is INPATIENT. Inpatient status is judged to be reasonable and necessary in order to provide the required intensity of service to ensure the patient's safety. The patient's presenting symptoms, physical exam findings, and initial radiographic and laboratory data in the context of their chronic comorbidities is felt to place them at high risk for further clinical deterioration. Furthermore, it is not anticipated that the patient will be medically stable for discharge from the hospital within 2 midnights of admission.   * I certify that at the point of admission it is my clinical judgment that the patient will require inpatient hospital care spanning beyond 2 midnights from the point of admission due to high intensity of service, high risk for further deterioration and high frequency of surveillance required.*  Author: Bernadette Hoit, DO 08/01/2021 10:11 PM  For on call review [email or web address].

## 2021-08-01 NOTE — Telephone Encounter (Signed)
Pt's daughter called into the clinic stating pt was c/o abdominal pain, bloating and having black tarry stools. Dr.K made aware and stating for pt to go to the ER. Called pt's daughter back to let her know what Dr.K recommended and she verbalized understanding.

## 2021-08-01 NOTE — Plan of Care (Signed)

## 2021-08-02 ENCOUNTER — Inpatient Hospital Stay (HOSPITAL_COMMUNITY): Payer: Medicare Other | Admitting: Anesthesiology

## 2021-08-02 ENCOUNTER — Encounter (HOSPITAL_COMMUNITY): Payer: Self-pay | Admitting: Internal Medicine

## 2021-08-02 ENCOUNTER — Encounter (HOSPITAL_COMMUNITY)
Admission: EM | Disposition: A | Discharge status: Home / Self Care | Payer: Self-pay | Source: Home / Self Care | Attending: Internal Medicine

## 2021-08-02 DIAGNOSIS — K3182 Dieulafoy lesion (hemorrhagic) of stomach and duodenum: Principal | ICD-10-CM

## 2021-08-02 DIAGNOSIS — K2961 Other gastritis with bleeding: Secondary | ICD-10-CM

## 2021-08-02 HISTORY — PX: HOT HEMOSTASIS: SHX5433

## 2021-08-02 HISTORY — PX: BIOPSY: SHX5522

## 2021-08-02 HISTORY — PX: ESOPHAGOGASTRODUODENOSCOPY (EGD) WITH PROPOFOL: SHX5813

## 2021-08-02 LAB — CBC
HCT: 34.7 % — ABNORMAL LOW (ref 39.0–52.0)
Hemoglobin: 12 g/dL — ABNORMAL LOW (ref 13.0–17.0)
MCH: 34.7 pg — ABNORMAL HIGH (ref 26.0–34.0)
MCHC: 34.6 g/dL (ref 30.0–36.0)
MCV: 100.3 fL — ABNORMAL HIGH (ref 80.0–100.0)
Platelets: 311 10*3/uL (ref 150–400)
RBC: 3.46 MIL/uL — ABNORMAL LOW (ref 4.22–5.81)
RDW: 12.1 % (ref 11.5–15.5)
WBC: 6.8 10*3/uL (ref 4.0–10.5)
nRBC: 0 % (ref 0.0–0.2)

## 2021-08-02 LAB — TSH: TSH: 1.242 u[IU]/mL (ref 0.350–4.500)

## 2021-08-02 LAB — COMPREHENSIVE METABOLIC PANEL
ALT: 24 U/L (ref 0–44)
AST: 21 U/L (ref 15–41)
Albumin: 3.3 g/dL — ABNORMAL LOW (ref 3.5–5.0)
Alkaline Phosphatase: 50 U/L (ref 38–126)
Anion gap: 8 (ref 5–15)
BUN: 40 mg/dL — ABNORMAL HIGH (ref 8–23)
CO2: 22 mmol/L (ref 22–32)
Calcium: 8.8 mg/dL — ABNORMAL LOW (ref 8.9–10.3)
Chloride: 101 mmol/L (ref 98–111)
Creatinine, Ser: 1.58 mg/dL — ABNORMAL HIGH (ref 0.61–1.24)
GFR, Estimated: 41 mL/min — ABNORMAL LOW (ref >60)
Glucose, Bld: 89 mg/dL (ref 70–99)
Potassium: 4.5 mmol/L (ref 3.5–5.1)
Sodium: 131 mmol/L — ABNORMAL LOW (ref 135–145)
Total Bilirubin: 1 mg/dL (ref 0.3–1.2)
Total Protein: 6.5 g/dL (ref 6.5–8.1)

## 2021-08-02 LAB — PHOSPHORUS: Phosphorus: 4 mg/dL (ref 2.5–4.6)

## 2021-08-02 LAB — FOLATE: Folate: 12.1 ng/mL (ref >5.9)

## 2021-08-02 LAB — MAGNESIUM: Magnesium: 2.3 mg/dL (ref 1.7–2.4)

## 2021-08-02 LAB — VITAMIN B12: Vitamin B-12: 803 pg/mL (ref 180–914)

## 2021-08-02 LAB — OSMOLALITY: Osmolality: 281 mOsm/kg (ref 275–295)

## 2021-08-02 SURGERY — ESOPHAGOGASTRODUODENOSCOPY (EGD) WITH PROPOFOL
Anesthesia: General

## 2021-08-02 MED ORDER — SODIUM CHLORIDE 0.9 % IV SOLN: INTRAVENOUS | Status: DC

## 2021-08-02 MED ORDER — CARVEDILOL 3.125 MG PO TABS
3.1250 mg | ORAL_TABLET | Freq: Two times a day (BID) | ORAL | Status: DC
  Administered 2021-08-02 – 2021-08-03 (×3): 3.125 mg via ORAL
  Filled 2021-08-02 (×3): qty 1

## 2021-08-02 MED ORDER — SODIUM CHLORIDE 0.9 % IV SOLN
INTRAVENOUS | Status: AC
Start: 2021-08-02 — End: 2021-08-02

## 2021-08-02 MED ORDER — LACTATED RINGERS IV SOLN: INTRAVENOUS | Status: DC | PRN

## 2021-08-02 MED ORDER — ATORVASTATIN CALCIUM 10 MG PO TABS
20.0000 mg | ORAL_TABLET | Freq: Every day | ORAL | Status: DC
  Administered 2021-08-02: 20 mg via ORAL
  Filled 2021-08-02: qty 1

## 2021-08-02 MED ORDER — PROPOFOL 500 MG/50ML IV EMUL
INTRAVENOUS | Status: DC | PRN
  Administered 2021-08-02: 180 mg via INTRAVENOUS

## 2021-08-02 MED ORDER — PROPOFOL 10 MG/ML IV BOLUS
INTRAVENOUS | Status: AC
  Filled 2021-08-02: qty 20

## 2021-08-02 MED ORDER — FERROUS SULFATE 325 (65 FE) MG PO TABS
324.0000 mg | ORAL_TABLET | Freq: Two times a day (BID) | ORAL | Status: DC
  Administered 2021-08-02 – 2021-08-03 (×3): 324 mg via ORAL
  Filled 2021-08-02 (×3): qty 1

## 2021-08-02 MED ORDER — TAMSULOSIN HCL 0.4 MG PO CAPS
0.4000 mg | ORAL_CAPSULE | Freq: Every day | ORAL | Status: DC
  Administered 2021-08-02: 0.4 mg via ORAL
  Filled 2021-08-02: qty 1

## 2021-08-02 NOTE — Consult Note (Signed)
Referring Provider: No ref. provider found Primary Care Physician:  [facility] Primary Gastroenterologist:  Dr. Eula Listen  Reason for Consultation: Melena/Hemoccult positive stool  HPI: 88 year old gentleman with a history of metastatic prostate cancer, atrial fibrillation on Xarelto presented to the ED yesterday with a 2-day history of black tarry stools vague abdominal bloating and weakness. Presented to our ED we he was found to be hemodynamically stable.  Melena on digital rectal exam; admission hemoglobin 12.4; 13.5 on 07/07/2021. Noncontrast CT of the abdomen demonstrated bilateral bilateral enhancing renal masses suspicious for carcinoma.  Surgical changes consistent with right hemicolectomy with ileocolonic anastomosis.  Otherwise, nothing acute. (Interestingly, patient and family currently denying history of colon cancer).  Seen here in December 2022 for right scapular pain.  He was seen in GI consultation.  EGD by Dr. 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  No H. pylori on biopsy.  Last colonoscopy possibly several years ago at the [facility].  Patient/family unsure.  Patient has a history of GERD well-controlled on Protonix 40 mg daily.  Denies nausea vomiting dysphagia or odynophagia patient denies any bowel dysfunction.  Denies hematochezia.  Denies abdominal pain at this time.  No Xarelto now in 2 days.  Denies systemic NSAIDs.  Takes Nubeqa.   Past Medical History:  Diagnosis Date   Acid reflux    Anemia    Colon cancer ([place])    per patient, colon cancer more than 10 years ago s/p right hemicolectomy   Ex-cigarette smoker    Gout    Hyperlipemia    Hypertension    Paroxysmal atrial fibrillation ([place]) 2014   Prostate cancer ([place]) 08/08/2013   SEEDS 20 YEARS AGO   Prostate disease     Past Surgical History:  Procedure Laterality Date   BIOPSY   02/05/2021   Procedure: BIOPSY;  Surgeon: Harvel Quale, MD;  Location: [facility];  Service: Gastroenterology;;   COLON SURGERY     Right hemicolectomy in the setting of colon cancer, per patient   CYSTOSCOPY WITH INSERTION OF UROLIFT N/A 05/19/2021   Procedure: CYSTOSCOPY WITH INSERTION OF UROLIFT;  Surgeon: Cleon Gustin, MD;  Location: [facility] ORS;  Service: Urology;  Laterality: N/A;   ESOPHAGOGASTRODUODENOSCOPY (EGD) WITH PROPOFOL N/A 02/05/2021   Procedure: ESOPHAGOGASTRODUODENOSCOPY (EGD) WITH PROPOFOL;  Surgeon: Harvel Quale, MD;  Location: [facility];  Service: Gastroenterology;  Laterality: N/A;   LEFT HEART CATHETERIZATION WITH CORONARY ANGIOGRAM N/A 07/21/2012   Procedure: LEFT HEART CATHETERIZATION WITH CORONARY ANGIOGRAM;  Surgeon: Troy Sine, MD;  Location: [facility] CATH LAB;  Service: Cardiovascular;  Laterality: N/A;   PACEMAKER INSERTION     PERMANENT PACEMAKER INSERTION N/A 07/22/2012   Procedure: PERMANENT PACEMAKER INSERTION;  Surgeon: Sanda Klein, MD;  Location: [facility] CATH LAB;  Service: Cardiovascular;  Laterality: N/A;   PROSTATE SURGERY      Prior to Admission medications   Medication Sig Start Date End Date Taking? Authorizing Provider  acetaminophen (TYLENOL) 650 MG CR tablet Take 1,300 mg by mouth every 8 (eight) hours as needed for pain.   Yes [provider]  ascorbic acid (VITAMIN C) 250 MG tablet Take 250 mg by mouth 3 (three) times daily. Take with Iron (Ferrous Sulfate) to promote better absorption 03/18/21  Yes [provider]  atorvastatin (LIPITOR) 20 MG tablet Take 1 tablet (20 mg total) by mouth daily  at 6 PM. 07/23/12  Yes Lyda Jester M, PA-C  carvedilol (COREG) 3.125 MG tablet Take 1 tablet (3.125 mg total) by mouth 2 (two) times daily. 03/28/21 05/15/23 Yes Antonieta Pert, MD  Cholecalciferol 25 MCG (1000 UT) tablet Take 1,000 Units by mouth daily.   Yes [provider]  clotrimazole-betamethasone  (LOTRISONE) cream Apply 1 application. topically 2 (two) times daily. 05/21/21  Yes McKenzie, Candee Furbish, MD  darolutamide (NUBEQA) 300 MG tablet Take 1 tablet (300 mg) by mouth twice daily for 2 weeks, then increase to 2 tablets (600 mg total) by mouth twice daily. Take with food. 06/30/21  Yes Derek Jack, MD  diclofenac sodium (VOLTAREN) 1 % GEL Apply 2 g topically 4 (four) times daily. Patient taking differently: Apply 2 g topically 4 (four) times daily as needed (pain). 06/19/17  Yes Rancour, Annie Main, MD  Ensure (ENSURE) Take 237 mLs by mouth daily as needed (nutrition). Vanilla or chocolate   Yes [provider]  ferrous sulfate 324 MG TBEC Take 324 mg by mouth 2 (two) times daily.   Yes [provider]  fluticasone (FLONASE) 50 MCG/ACT nasal spray Place 2 sprays into both nostrils daily. Patient taking differently: Place 2 sprays into both nostrils daily as needed for allergies. 02/15/16  Yes Ward, Delice Bison, DO  furosemide (LASIX) 40 MG tablet Take 40 mg by mouth daily. 03/18/21  Yes [provider]  magnesium oxide (MAG-OX) 400 (240 Mg) MG tablet Take 1 tablet (400 mg total) by mouth 3 (three) times daily. 06/02/21  Yes Derek Jack, MD  megestrol (MEGACE) 400 MG/10ML suspension Take 10 mLs (400 mg total) by mouth 2 (two) times daily. 03/20/21  Yes Derek Jack, MD  pantoprazole (PROTONIX) 40 MG tablet TAKE 1 TABLET BY MOUTH ONCE DAILY. Patient taking differently: Take 40 mg by mouth daily. 07/22/21  Yes Derek Jack, MD  potassium chloride SA (KLOR-CON) 20 MEQ tablet Take 20 mEq by mouth daily. 02/28/20  Yes [provider]  predniSONE (DELTASONE) 5 MG tablet Take 5 mg by mouth every morning. Continuous 06/12/21  Yes [provider]  rivaroxaban (XARELTO) 20 MG TABS tablet Take 1 tablet (20 mg total) by mouth daily with supper. 10/30/13  Yes Croitoru, Mihai, MD  tamsulosin (FLOMAX) 0.4 MG CAPS capsule Take 1 capsule (0.4 mg  total) by mouth daily after supper. 05/07/21  Yes McKenzie, Candee Furbish, MD  traMADol (ULTRAM) 50 MG tablet Take 1 tablet (50 mg total) by mouth every 6 (six) hours as needed. Patient taking differently: Take 50 mg by mouth every 6 (six) hours as needed for severe pain. 05/24/21  Yes Daleen Bo, MD    Current Facility-Administered Medications  Medication Dose Route Frequency Provider Last Rate Last Admin   Chlorhexidine Gluconate Cloth 2 % PADS 6 each  6 each Topical Daily Adefeso, Oladapo, DO       hydrALAZINE (APRESOLINE) injection 10 mg  10 mg Intravenous Q6H PRN Adefeso, Oladapo, DO       metoprolol tartrate (LOPRESSOR) injection 2.5 mg  2.5 mg Intravenous Q6H PRN Adefeso, Oladapo, DO       morphine (PF) 2 MG/ML injection 2 mg  2 mg Intravenous Q4H PRN Adefeso, Oladapo, DO       ondansetron (ZOFRAN) tablet 4 mg  4 mg Oral Q6H PRN Adefeso, Oladapo, DO       Or   ondansetron (ZOFRAN) injection 4 mg  4 mg Intravenous Q6H PRN Adefeso, Oladapo, DO  pantoprazole (PROTONIX) injection 40 mg  40 mg Intravenous Q12H Adefeso, Oladapo, DO   40 mg at 08/01/21 2352   Facility-Administered Medications Ordered in Other Encounters  Medication Dose Route Frequency Provider Last Rate Last Admin   octreotide (SANDOSTATIN LAR) 20 MG IM injection             Allergies as of 08/01/2021   (No Known Allergies)    Family History  Problem Relation Age of Onset   Cancer Neg Hx     Social History   Socioeconomic History   Marital status: Married    Spouse name: Not on file   Number of children: Not on file   Years of education: Not on file   Highest education level: Not on file  Occupational History   Not on file  Tobacco Use   Smoking status: Former    Types: Cigarettes    Quit date: 07/20/2002    Years since quitting: 19.0   Smokeless tobacco: Never  Vaping Use   Vaping Use: Never used  Substance and Sexual Activity   Alcohol use: No   Drug use: No   Sexual activity: Not on file  Other  Topics Concern   Not on file  Social History Narrative   Not on file   Social Determinants of Health   Financial Resource Strain: Low Risk  (05/16/2020)   Overall Financial Resource Strain (CARDIA)    Difficulty of Paying Living Expenses: Not hard at all  Food Insecurity: No Food Insecurity (05/16/2020)   Hunger Vital Sign    Worried About Running Out of Food in the Last Year: Never true    Ran Out of Food in the Last Year: Never true  Transportation Needs: No Transportation Needs (05/16/2020)   PRAPARE - Hydrologist (Medical): No    Lack of Transportation (Non-Medical): No  Physical Activity: Insufficiently Active (05/16/2020)   Exercise Vital Sign    Days of Exercise per Week: 3 days    Minutes of Exercise per Session: 30 min  Stress: No Stress Concern Present (05/16/2020)   [place]    Feeling of Stress : Not at all  Social Connections: Moderately Integrated (05/16/2020)   Social Connection and Isolation Panel [NHANES]    Frequency of Communication with Friends and Family: More than three times a week    Frequency of Social Gatherings with Friends and Family: More than three times a week    Attends Religious Services: More than 4 times per year    Active Member of Clubs or Organizations: No    Attends Archivist Meetings: 1 to 4 times per year    Marital Status: Widowed  Intimate Partner Violence: Not At Risk (05/16/2020)   Humiliation, Afraid, Rape, and Kick questionnaire    Fear of Current or Ex-Partner: No    Emotionally Abused: No    Physically Abused: No    Sexually Abused: No    Review of Systems:  As in history of present illness  Physical Exam: Vital signs in last 24 hours: Temp:  [97.9 F (36.6 C)-98.4 F (36.9 C)] 98.4 F (36.9 C) (06/17 0732) Pulse Rate:  [59-76] 67 (06/16 2300) Resp:  [18-20] 18 (06/16 2333) BP: (139-190)/(68-94) 139/68 (06/16  2333) SpO2:  [97 %-100 %] 98 % (06/16 2333) FiO2 (%):  [21 %] 21 % (06/16 2204) Weight:  [79.9 kg-84.8 kg] 79.9 kg (06/16 2333)   General:  Elderly alert pleasant and cooperative in NAD -accompanied by his son and daughter,  Brett Albino and Assunta Found. Neck:  Supple; no masses or thyromegaly. Lungs:  Clear throughout to auscultation.   No wheezes, crackles, or rhonchi. No acute distress. Heart:  Regular rate and rhythm; no murmurs, clicks, rubs,  or gallops. Abdomen:  Soft, nontender and nondistended. No masses, hepatosplenomegaly or hernias noted. Normal bowel sounds, without guarding, and without rebound.    Intake/Output from previous day: 06/16 0701 - 06/17 0700 In: 649.3 [P.O.:150; I.V.:499.3] Out: 500 [Urine:500] Intake/Output this shift: Total I/O In: 256.7 [I.V.:256.7] Out: 200 [Urine:200]  Lab Results: Recent Labs    08/01/21 1309 08/02/21 0344  WBC 6.5 6.8  HGB 12.5* 12.0*  HCT 35.7* 34.7*  PLT 336 311   BMET Recent Labs    08/01/21 1309 08/02/21 0344  NA 127* 131*  K 5.0 4.5  CL 96* 101  CO2 23 22  GLUCOSE 114* 89  BUN 45* 40*  CREATININE 1.75* 1.58*  CALCIUM 9.3 8.8*   LFT Recent Labs    08/02/21 0344  PROT 6.5  ALBUMIN 3.3*  AST 21  ALT 24  ALKPHOS 50  BILITOT 1.0   PT/INR No results for input(s): "LABPROT", "INR" in the last 72 hours. Hepatitis Panel No results for input(s): "HEPBSAG", "HCVAB", "HEPAIGM", "HEPBIGM" in the last 72 hours. C-Diff No results for input(s): "CDIFFTOX" in the last 72 hours.  Studies/Results: CT ABDOMEN PELVIS W CONTRAST  Result Date: 08/01/2021 CLINICAL DATA:  Acute nonlocalized abdominal pain. Black stools and weakness. Abdominal pain for the last several days. EXAM: CT ABDOMEN AND PELVIS WITH CONTRAST TECHNIQUE: Multidetector CT imaging of the abdomen and pelvis was performed using the standard protocol following bolus administration of intravenous contrast. RADIATION DOSE REDUCTION: This exam was performed according to  the departmental dose-optimization program which includes automated exposure control, adjustment of the mA and/or kV according to patient size and/or use of iterative reconstruction technique. CONTRAST:  31m OMNIPAQUE IOHEXOL 300 MG/ML  SOLN COMPARISON:  CT abdomen pelvis without contrast 02/19/2021, CT abdomen and pelvis with contrast 10/25/2020 FINDINGS: Lower chest: Minimal curvilinear scarring versus subsegmental atelectasis within the inferior aspect of the middle lobe unchanged. Mild subsegmental atelectasis within the posterior left lower lobe. Heart size is mildly enlarged. Cardiac pacer wires are noted. Unchanged 4 mm anterolateral right lower lobe nodule compared to 02/19/2021. Hepatobiliary: Smooth liver contours. No focal liver mass is identified. Status post cholecystectomy. Mild central intrahepatic biliary ductal dilatation appears unchanged likely postsurgical. The common bile duct measures up to approximately 7 mm, unchanged from prior and within normal limits given post cholecystectomy and patient age. Pancreas: No mass or inflammatory fat stranding. No pancreatic ductal dilatation is seen. Spleen: Normal in size without focal abnormality. Adrenals/Urinary Tract: Unchanged right adrenal nodule measuring up to approximately 11 x 19 mm (axial image 21). Normal left adrenal gland. There are numerous bilateral renal lesions. Some of these demonstrate fluid density suggesting cysts. There is an apparent partially enhancing mass extending exophytically from the lower pole of the right kidney measuring up to 2.3 x 1.9 by 2.7 cm (axial image 32 and coronal image 45) concerning for enhancing mass of renal cell carcinoma. This is a similar size 04/01/2021 noncontrast CT when this measured 17 Hounsfield units centrally. Within the far inferior aspect of the lower pole there is an exophytic 2.0 cm centrally low dense lesion and peripherally enhancing lesion that may represent a cyst versus neoplasm. This  measures 33 Hounsfield units  centrally. Within the upper pole of the left kidney there is a partially enhancing mass measuring 2.2 x 2.6 x 2.3 cm (axial image 18 and coronal image 46). This is also suspicious for a solid enhancing mass of renal cell carcinoma. This is not significant changed in size from 02/19/2021 noncontrast CT when the central aspect measured 17 Hounsfield units. No focal urinary bladder wall thickening. Stomach/Bowel: A diverticulum is seen within the sigmoid colon without inflammatory change. Moderate to high-grade stool is seen throughout the colon. Surgical suture is seen in the region of the proximal ascending colon, an ileocolic anastomosis. No dilated loops of bowel to indicate bowel obstruction. Vascular/Lymphatic: No abdominal aortic aneurysm. Moderate to high-grade atherosclerotic calcifications. The right common iliac artery is distended up to 2.0 cm in the left common iliac artery is distended up to 1.8 cm. No mesenteric, retroperitoneal, or pelvic lymphadenopathy. Reproductive: The prostate gland is again enlarged and contains brachytherapy seeds. The seminal vesicles are grossly unremarkable. Other: Small fat containing umbilical hernia.No abdominopelvic ascites. No pneumoperitoneum. Musculoskeletal: Mild levocurvature of the mid to upper lumbar spine. Severe right L2-3 disc space narrowing and endplate degenerative changes. IMPRESSION: Compared to 02/19/2021: 1. No CT explanation for the patient's abdominal pain. 2. Bilateral enhancing renal masses again suspicious for renal cell carcinoma. 3. Postsurgical changes of prior right hemicolectomy and ileocolic anastomosis. No bowel obstruction. 4. Additional chronic findings as above. Electronically Signed   By: Yvonne Kendall M.D.   On: 08/01/2021 18:54   Notice:  This dictation was prepared with Dragon dictation along with smaller phrase technology. Any transcriptional errors that result from this process are unintentional and may  not be corrected upon review.   Impression: Frail 88 year old gentleman admitted to the hospital with melena/Hemoccult positive stool in the setting of chronic anticoagulation, metastatic prostate cancer and distant history of colon cancer. Known history of at least 1 gastric AVM on prior EGD.  He remains hemodynamically stable without a significant drop in his hemoglobin.  Presentation suggestive of a more proximal GI tract site of blood loss.  Differential includes acid peptic disease, AVMs, metastatic disease.  No Xarelto now in 2 days.  Recommendations:  I have recommended a diagnostic EGD in the near future.The risks, benefits, limitations, alternatives and imponderables have been reviewed with the patient. Potential for esophageal dilation, biopsy, etc. have also been reviewed.  Questions have been answered.  Patient, his children, [name] and [facility] are all agreeable.  Continue PPI.  Further recommendations to follow after EGD has been performed.  ASA 4.

## 2021-08-02 NOTE — Plan of Care (Signed)

## 2021-08-02 NOTE — Progress Notes (Signed)
Derrick Long  WEX:[id number] DOB: [birth year] DOA: 08/01/2021 PCP: [facility]   Brief Narrative:    MAJD TISSUE is a 88 y.o. 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 Delton Coombes) who presents to the emergency department due to 2-day onset of having black tarry stools, abdominal pain, bloating and weakness.  He was admitted for evaluation of acute GI bleed and underwent EGD 6/17 with demonstrated slow bleeding Dieulafoy lesion that required APC and clips.  Assessment & Plan:   Principal Problem:   GI bleed Active Problems:   Essential hypertension   PROSTATE CANCER, HX OF   Abdominal pain   History of atrial fibrillation   Hyponatremia   Macrocytic anemia   Mixed hyperlipidemia  Assessment and Plan:  Acute upper GI bleed with noted Dieulafoy lesion Status post EGD with APC and clips Monitor hemoglobin trend Continue PPI Clear liquid diet and advance as tolerated   Abdominal pain-resolved Continue IV hydration Continue IV morphine 2 mg q.4h p.r.n. for moderate to severe pain Continue IV Zofran p.r.n.   Hyponatremia-improving Na 131. Continue IV hydration Continue to monitor sodium levels with BMP Check urine and serum osmolarity as well as TSH   Macrocytic Anemia-stable MCV 100.0, H/H12.5/35.7 Folate level and vitamin B12 levels within normal limits   AKI on CKD stage 3B-improving BUN/creatinine 45/1.74 (baseline creatinine at 1.3-1.4) Continue gentle hydration Renally adjust medications, avoid nephrotoxic agents/dehydration/hypotension   Essential hypertension Continue IV hydralazine 10 mg every 6 hours as needed for SBP > 170 Continue home meds when patient resumes oral intake (currently n.p.o.)  Mixed hyperlipidemia Continue home meds when patient resumes oral intake (currently n.p.o.)  Chronic  atrial fibrillation Xarelto will be held at this time due to GI bleed Continue IV metoprolol 2.5 mg every 6 hours as needed for HR 110   Metastatic prostate cancer to the right adrenal gland Prior renal carcinoma metastatic to right adrenal gland Patient follows with Dr. Delton Coombes    DVT prophylaxis: SCDs Code Status: DNR Family Communication: Daughter at bedside 6/17 Disposition Plan:  Status is: Inpatient Remains inpatient appropriate because: Need for IV medications.  Consultants:  GI  Procedures:  EGD 6/17  Antimicrobials:  None   Subjective: Patient seen and evaluated today with no new acute complaints or concerns. No acute concerns or events noted overnight.  Objective: Vitals:   08/02/21 1113 08/02/21 1143 08/02/21 1255 08/02/21 1315  BP: (!) 175/61 (!) 187/80 (!) 146/63 (!) 165/99  Pulse: 64  64 60  Resp: 15 (!) '23 18 14  '$ Temp: 98.1 F (36.7 C)  98 F (36.7 C)   TempSrc: Oral Oral    SpO2: 99% 99% 100% 100%  Weight:      Height:        Intake/Output Summary (Last 24 hours) at 08/02/2021 1343 Last data filed at 08/02/2021 1252 Gross per 24 hour  Intake 1305.99 ml  Output 700 ml  Net 605.99 ml   Filed Weights   08/01/21 1302 08/01/21 2333  Weight: 84.8 kg 79.9 kg    Examination:  General exam: Appears calm and comfortable  Respiratory system: Clear to auscultation. Respiratory effort normal. Cardiovascular system: S1 & S2 heard, RRR.  Gastrointestinal system: Abdomen is soft Central nervous system: Alert and awake Extremities: No edema Skin: No significant lesions noted Psychiatry: Flat affect.    Data Reviewed: I have personally reviewed  following labs and imaging studies  CBC: Recent Labs  Lab 08/01/21 1309 08/02/21 0344  WBC 6.5 6.8  HGB 12.5* 12.0*  HCT 35.7* 34.7*  MCV 100.0 100.3*  PLT 336 829   Basic Metabolic Panel: Recent Labs  Lab 08/01/21 1309 08/02/21 0344  NA 127* 131*  K 5.0 4.5  CL 96* 101  CO2 23 22   GLUCOSE 114* 89  BUN 45* 40*  CREATININE 1.75* 1.58*  CALCIUM 9.3 8.8*  MG  --  2.3  PHOS  --  4.0   GFR: Estimated Creatinine Clearance: 33 mL/min (A) (by C-G formula based on SCr of 1.58 mg/dL (H)). Liver Function Tests: Recent Labs  Lab 08/01/21 1309 08/02/21 0344  AST 25 21  ALT 28 24  ALKPHOS 59 50  BILITOT 0.7 1.0  PROT 7.4 6.5  ALBUMIN 3.8 3.3*   Recent Labs  Lab 08/01/21 1309  LIPASE 48   No results for input(s): "AMMONIA" in the last 168 hours. Coagulation Profile: No results for input(s): "INR", "PROTIME" in the last 168 hours. Cardiac Enzymes: No results for input(s): "CKTOTAL", "CKMB", "CKMBINDEX", "TROPONINI" in the last 168 hours. BNP (last 3 results) No results for input(s): "PROBNP" in the last 8760 hours. HbA1C: No results for input(s): "HGBA1C" in the last 72 hours. CBG: No results for input(s): "GLUCAP" in the last 168 hours. Lipid Profile: No results for input(s): "CHOL", "HDL", "LDLCALC", "TRIG", "CHOLHDL", "LDLDIRECT" in the last 72 hours. Thyroid Function Tests: No results for input(s): "TSH", "T4TOTAL", "FREET4", "T3FREE", "THYROIDAB" in the last 72 hours. Anemia Panel: Recent Labs    08/02/21 0344  VITAMINB12 803  FOLATE 12.1   Sepsis Labs: No results for input(s): "PROCALCITON", "LATICACIDVEN" in the last 168 hours.  No results found for this or any previous visit (from the past 240 hour(s)).       Radiology Studies: CT ABDOMEN PELVIS W CONTRAST  Result Date: 08/01/2021 CLINICAL DATA:  Acute nonlocalized abdominal pain. Black stools and weakness. Abdominal pain for the last several days. EXAM: CT ABDOMEN AND PELVIS WITH CONTRAST TECHNIQUE: Multidetector CT imaging of the abdomen and pelvis was performed using the standard protocol following bolus administration of intravenous contrast. RADIATION DOSE REDUCTION: This exam was performed according to the departmental dose-optimization program which includes automated exposure  control, adjustment of the mA and/or kV according to patient size and/or use of iterative reconstruction technique. CONTRAST:  34m OMNIPAQUE IOHEXOL 300 MG/ML  SOLN COMPARISON:  CT abdomen pelvis without contrast 02/19/2021, CT abdomen and pelvis with contrast 10/25/2020 FINDINGS: Lower chest: Minimal curvilinear scarring versus subsegmental atelectasis within the inferior aspect of the middle lobe unchanged. Mild subsegmental atelectasis within the posterior left lower lobe. Heart size is mildly enlarged. Cardiac pacer wires are noted. Unchanged 4 mm anterolateral right lower lobe nodule compared to 02/19/2021. Hepatobiliary: Smooth liver contours. No focal liver mass is identified. Status post cholecystectomy. Mild central intrahepatic biliary ductal dilatation appears unchanged likely postsurgical. The common bile duct measures up to approximately 7 mm, unchanged from prior and within normal limits given post cholecystectomy and patient age. Pancreas: No mass or inflammatory fat stranding. No pancreatic ductal dilatation is seen. Spleen: Normal in size without focal abnormality. Adrenals/Urinary Tract: Unchanged right adrenal nodule measuring up to approximately 11 x 19 mm (axial image 21). Normal left adrenal gland. There are numerous bilateral renal lesions. Some of these demonstrate fluid density suggesting cysts. There is an apparent partially enhancing mass extending exophytically from the lower pole of the right  kidney measuring up to 2.3 x 1.9 by 2.7 cm (axial image 32 and coronal image 45) concerning for enhancing mass of renal cell carcinoma. This is a similar size 04/01/2021 noncontrast CT when this measured 17 Hounsfield units centrally. Within the far inferior aspect of the lower pole there is an exophytic 2.0 cm centrally low dense lesion and peripherally enhancing lesion that may represent a cyst versus neoplasm. This measures 33 Hounsfield units centrally. Within the upper pole of the left kidney  there is a partially enhancing mass measuring 2.2 x 2.6 x 2.3 cm (axial image 18 and coronal image 46). This is also suspicious for a solid enhancing mass of renal cell carcinoma. This is not significant changed in size from 02/19/2021 noncontrast CT when the central aspect measured 17 Hounsfield units. No focal urinary bladder wall thickening. Stomach/Bowel: A diverticulum is seen within the sigmoid colon without inflammatory change. Moderate to high-grade stool is seen throughout the colon. Surgical suture is seen in the region of the proximal ascending colon, an ileocolic anastomosis. No dilated loops of bowel to indicate bowel obstruction. Vascular/Lymphatic: No abdominal aortic aneurysm. Moderate to high-grade atherosclerotic calcifications. The right common iliac artery is distended up to 2.0 cm in the left common iliac artery is distended up to 1.8 cm. No mesenteric, retroperitoneal, or pelvic lymphadenopathy. Reproductive: The prostate gland is again enlarged and contains brachytherapy seeds. The seminal vesicles are grossly unremarkable. Other: Small fat containing umbilical hernia.No abdominopelvic ascites. No pneumoperitoneum. Musculoskeletal: Mild levocurvature of the mid to upper lumbar spine. Severe right L2-3 disc space narrowing and endplate degenerative changes. IMPRESSION: Compared to 02/19/2021: 1. No CT explanation for the patient's abdominal pain. 2. Bilateral enhancing renal masses again suspicious for renal cell carcinoma. 3. Postsurgical changes of prior right hemicolectomy and ileocolic anastomosis. No bowel obstruction. 4. Additional chronic findings as above. Electronically Signed   By: Yvonne Kendall M.D.   On: 08/01/2021 18:54        Scheduled Meds:  Chlorhexidine Gluconate Cloth  6 each Topical Daily   pantoprazole (PROTONIX) IV  40 mg Intravenous Q12H     LOS: 1 day    Time spent: 35 minutes    Eilleen Davoli Darleen Crocker, DO [facility]  If 7PM-7AM, please contact  night-coverage [email or web address] 08/02/2021, 1:43 PM

## 2021-08-02 NOTE — Transfer of Care (Signed)
Immediate Anesthesia Transfer of Care Note  Patient: Derrick Long  Procedure(s) Performed: ESOPHAGOGASTRODUODENOSCOPY (EGD) WITH PROPOFOL BIOPSY HOT HEMOSTASIS (ARGON PLASMA COAGULATION/BICAP)  Patient Location: PACU  Anesthesia Type:General  Level of Consciousness: drowsy  Airway & Oxygen Therapy: Patient Spontanous Breathing and Patient connected to nasal cannula oxygen  Post-op Assessment: Report given to RN and Post -op Vital signs reviewed and stable  Post vital signs: Reviewed and stable  Last Vitals:  Vitals Value Taken Time  BP    Temp    Pulse    Resp    SpO2      Last Pain:  Vitals:   08/02/21 1233  TempSrc:   PainSc: 0-No pain         Complications: No notable events documented.

## 2021-08-02 NOTE — Anesthesia Postprocedure Evaluation (Signed)
Anesthesia Post Note  Patient: Derrick Long  Procedure(s) Performed: ESOPHAGOGASTRODUODENOSCOPY (EGD) WITH PROPOFOL BIOPSY HOT HEMOSTASIS (ARGON PLASMA COAGULATION/BICAP)  Patient location during evaluation: PACU Anesthesia Type: General Level of consciousness: awake and alert Pain management: pain level controlled Vital Signs Assessment: post-procedure vital signs reviewed and stable Respiratory status: spontaneous breathing, nonlabored ventilation, respiratory function stable and patient connected to nasal cannula oxygen Cardiovascular status: blood pressure returned to baseline and stable Postop Assessment: no apparent nausea or vomiting Anesthetic complications: no   No notable events documented.   Last Vitals:  Vitals:   08/02/21 1113 08/02/21 1143  BP: (!) 175/61 (!) 187/80  Pulse: 64   Resp: 15 (!) 23  Temp: 36.7 C   SpO2: 99% 99%    Last Pain:  Vitals:   08/02/21 1233  TempSrc:   PainSc: 0-No pain                 Louann Sjogren

## 2021-08-02 NOTE — Op Note (Signed)
[facility] Patient Name: Derrick Long Procedure Date: 08/02/2021 12:26 PM MRN: [id number] Date of Birth: 1926/03/16 Attending MD: Norvel Richards , MD CSN: [id number] Age: 88 Admit Type: Inpatient Procedure:                Upper GI endoscopy Indications:              Melena Providers:                Norvel Richards, MD, Charlsie Quest. Theda Sers RN, RN,                            [place] Page Referring MD:              Medicines:                Propofol per Anesthesia Complications:            No immediate complications. Estimated Blood Loss:     Estimated blood loss was minimal. Procedure:                Pre-Anesthesia Assessment:                           - Prior to the procedure, a History and Physical                            was performed, and patient medications and                            allergies were reviewed. The patient's tolerance of                            previous anesthesia was also reviewed. The risks                            and benefits of the procedure and the sedation                            options and risks were discussed with the patient.                            All questions were answered, and informed consent                            was obtained. Prior Anticoagulants: The patient                            last took Xarelto (rivaroxaban) 2 days prior to the                            procedure. ASA Grade Assessment: IV - A patient                            with severe systemic disease that is a constant  threat to life. After reviewing the risks and                            benefits, the patient was deemed in satisfactory                            condition to undergo the procedure.                           After obtaining informed consent, the endoscope was                            passed under direct vision. Throughout the                            procedure, the patient's blood pressure,  pulse, and                            oxygen saturations were monitored continuously. The                            GIF-H190 ([id number]) scope was introduced through the                            mouth, and advanced to the third part of duodenum.                            The upper GI endoscopy was accomplished without                            difficulty. The patient tolerated the procedure                            well. Scope In: 12:37:21 PM Scope Out: 12:52:02 PM Total Procedure Duration: 0 hours 14 minutes 41 seconds  Findings:      The examined esophagus was normal.      Some nodularity, patchy erythema of the gastric body and antrum.       Previously noted xanthoma noted again in the antrum (see photos). In the       gastric body, there was a single AVM versus Dieulafoy found to be       actively oozing.      Pylorus patent. Examination the bulb, second third portion of the       duodenum revealed scattered verrucous appearing lesions with focal areas       of erosion of uncertain significance. Again, please see photos.      Utilizing the APC on stomach setting, the oozing lesion in the stomach       was treated with 4 applications. There was still some peripheral oozing.       2 hemostasis clips were applied to effectively seal this lesion.      Finally, biopsies for histology were taken from the abnormal gastric       mucosa at separate locations as well as abnormal appearing duodenal       mucosa. Impression:               -  Normal esophagus. Inflamed appearing gastric                            mucosa. Actively oozing Dieulafoy - stomach -                            sealed with APC and clips x 2; gastric xanthoma?"not                            manipulated.                           -Abnormal appearing duodenal mucosa of uncertain                            significance; status post gastric and duodenal                            biopsy. . Moderate Sedation:       Moderate (conscious) sedation was personally administered by an       anesthesia professional. The following parameters were monitored: oxygen       saturation, heart rate, blood pressure, respiratory rate, EKG, adequacy       of pulmonary ventilation, and response to care. Recommendation:           - Return patient to ICU for ongoing care.                           - Clear liquid diet.                           - Continue present medications. Continue once daily                            PPI. Follow-up on pathology. Resume Eliquis 6/21.                            If he displays evidence of recurrent bleeding,                            would need imaging of his small intestine.                           -Via telephone, I discussed my findings and                            recommendations with patient's son and daughter,                            [name] and [place].                           -If he progresses uneventfully, may be able to                            advance his diet and  discharge the patient within                            the next 24 hours. Procedure Code(s):        --- Professional ---                           [id number], Esophagogastroduodenoscopy, flexible,                            transoral; diagnostic, including collection of                            specimen(s) by brushing or washing, when performed                            (separate procedure) Diagnosis Code(s):        --- Professional ---                           K92.1, Melena (includes Hematochezia) CPT copyright 2019 American Medical Association. All rights reserved. The codes documented in this report are preliminary and upon coder review may  be revised to meet current compliance requirements. Cristopher Estimable. [place], MD Norvel Richards, MD 08/02/2021 1:08:54 PM This report has been signed electronically. Number of Addenda: 0

## 2021-08-02 NOTE — Anesthesia Preprocedure Evaluation (Signed)
Anesthesia Evaluation  Patient identified by MRN, date of birth, ID band Patient awake    Reviewed: Allergy & Precautions, H&P , NPO status , Patient's Chart, lab work & pertinent test results, reviewed documented beta blocker date and time   Airway Mallampati: II  TM Distance: >3 FB Neck ROM: full    Dental no notable dental hx.    Pulmonary neg pulmonary ROS, former smoker,    Pulmonary exam normal breath sounds clear to auscultation       Cardiovascular Exercise Tolerance: Good hypertension, + pacemaker  Rhythm:regular Rate:Normal     Neuro/Psych  Headaches, negative psych ROS   GI/Hepatic Neg liver ROS, GERD  Medicated,  Endo/Other  negative endocrine ROS  Renal/GU CRFRenal disease  negative genitourinary   Musculoskeletal   Abdominal   Peds  Hematology  (+) Blood dyscrasia, anemia ,   Anesthesia Other Findings   Reproductive/Obstetrics negative OB ROS                             Anesthesia Physical Anesthesia Plan  ASA: 4 and emergent  Anesthesia Plan: General   Post-op Pain Management:    Induction:   PONV Risk Score and Plan: Propofol infusion  Airway Management Planned:   Additional Equipment:   Intra-op Plan:   Post-operative Plan:   Informed Consent: I have reviewed the patients History and Physical, chart, labs and discussed the procedure including the risks, benefits and alternatives for the proposed anesthesia with the patient or authorized representative who has indicated his/her understanding and acceptance.     Dental Advisory Given  Plan Discussed with: CRNA  Anesthesia Plan Comments:         Anesthesia Quick Evaluation

## 2021-08-03 DIAGNOSIS — K2981 Duodenitis with bleeding: Secondary | ICD-10-CM

## 2021-08-03 LAB — BASIC METABOLIC PANEL
Anion gap: 5 (ref 5–15)
BUN: 27 mg/dL — ABNORMAL HIGH (ref 8–23)
CO2: 20 mmol/L — ABNORMAL LOW (ref 22–32)
Calcium: 8.8 mg/dL — ABNORMAL LOW (ref 8.9–10.3)
Chloride: 108 mmol/L (ref 98–111)
Creatinine, Ser: 1.33 mg/dL — ABNORMAL HIGH (ref 0.61–1.24)
GFR, Estimated: 50 mL/min — ABNORMAL LOW (ref >60)
Glucose, Bld: 93 mg/dL (ref 70–99)
Potassium: 4.3 mmol/L (ref 3.5–5.1)
Sodium: 133 mmol/L — ABNORMAL LOW (ref 135–145)

## 2021-08-03 LAB — SODIUM, URINE, RANDOM: Sodium, Ur: 74 mmol/L

## 2021-08-03 LAB — CBC
HCT: 34.3 % — ABNORMAL LOW (ref 39.0–52.0)
Hemoglobin: 11.8 g/dL — ABNORMAL LOW (ref 13.0–17.0)
MCH: 34.5 pg — ABNORMAL HIGH (ref 26.0–34.0)
MCHC: 34.4 g/dL (ref 30.0–36.0)
MCV: 100.3 fL — ABNORMAL HIGH (ref 80.0–100.0)
Platelets: 284 10*3/uL (ref 150–400)
RBC: 3.42 MIL/uL — ABNORMAL LOW (ref 4.22–5.81)
RDW: 11.9 % (ref 11.5–15.5)
WBC: 6.6 10*3/uL (ref 4.0–10.5)
nRBC: 0 % (ref 0.0–0.2)

## 2021-08-03 LAB — MAGNESIUM: Magnesium: 2 mg/dL (ref 1.7–2.4)

## 2021-08-03 LAB — OSMOLALITY, URINE: Osmolality, Ur: 365 mOsm/kg (ref 300–900)

## 2021-08-03 MED ORDER — RIVAROXABAN 20 MG PO TABS: 20.0000 mg | ORAL_TABLET | Freq: Every day | ORAL | 0 refills | Status: DC

## 2021-08-03 NOTE — Progress Notes (Signed)
Enjoying visit with multiple family members.  He is in the process of eating a regular breakfast.   Denies any nausea or vomiting.  Denies abdominal pain No bowel movement.    Vital signs in last 24 hours: Temp:  [98 F (36.7 C)-98.3 F (36.8 C)] 98 F (36.7 C) (06/18 0436) Pulse Rate:  [58-72] 72 (06/18 0436) Resp:  [14-23] 18 (06/18 0436) BP: (105-197)/(52-147) 160/65 (06/18 0436) SpO2:  [97 %-100 %] 99 % (06/18 0436) Last BM Date : 08/02/21 General:   Alert,  pleasant and cooperative in NAD Abdomen: Nondistended positive bowel sounds soft and nontender Intake/Output from previous day: 06/17 0701 - 06/18 0700 In: 2030.1 [P.O.:600; I.V.:1430.1] Out: 876 [Urine:875; Stool:1] Intake/Output this shift: No intake/output data recorded.  Lab Results: Recent Labs    08/01/21 1309 08/02/21 0344 08/03/21 0559  WBC 6.5 6.8 6.6  HGB 12.5* 12.0* 11.8*  HCT 35.7* 34.7* 34.3*  PLT 336 311 284   BMET Recent Labs    08/01/21 1309 08/02/21 0344 08/03/21 0559  NA 127* 131* 133*  K 5.0 4.5 4.3  CL 96* 101 108  CO2 23 22 20*  GLUCOSE 114* 89 93  BUN 45* 40* 27*  CREATININE 1.75* 1.58* 1.33*  CALCIUM 9.3 8.8* 8.8*   LFT Recent Labs    08/02/21 0344  PROT 6.5  ALBUMIN 3.3*  AST 21  ALT 24  ALKPHOS 50  BILITOT 1.0   PT/INR No results for input(s): "LABPROT", "INR" in the last 72 hours. Hepatitis Panel No results for input(s): "HEPBSAG", "HCVAB", "HEPAIGM", "HEPBIGM" in the last 72 hours. C-Diff No results for input(s): "CDIFFTOX" in the last 72 hours.    Impression: Pleasant 88 year old gentleman admitted to hospital upper GI bleed secondary to gastric AVM/Dielafoy.  Status post thermal ablation and clipping yesterday. Abnormal gastric and duodenal mucosa of uncertain significance status post biopsy  Recommendations:  Continue once daily PPI.  Resume anticoagulation 6/21  I have admonished patient and family members to watch for evidence of recurrent bleeding.   If this were to occur, he would need a repeat EGD and perhaps VCE of small bowel.  I have alerted the office to arrange scheduling follow-up appointment with Dr. Jenetta Downer in approximately 10 weeks.  Okay with discharge from a GI standpoint.

## 2021-08-03 NOTE — Plan of Care (Signed)
  Problem: Education: Goal: Knowledge of General Education information will improve Description: Including pain rating scale, medication(s)/side effects and non-pharmacologic comfort measures 08/03/2021 1010 by Melony Overly, RN Outcome: Adequate for Discharge 08/03/2021 1010 by Melony Overly, RN Outcome: Adequate for Discharge   Problem: Health Behavior/Discharge Planning: Goal: Ability to manage health-related needs will improve 08/03/2021 1010 by Melony Overly, RN Outcome: Adequate for Discharge 08/03/2021 1010 by Melony Overly, RN Outcome: Adequate for Discharge   Problem: Clinical Measurements: Goal: Ability to maintain clinical measurements within normal limits will improve 08/03/2021 1010 by Melony Overly, RN Outcome: Adequate for Discharge 08/03/2021 1010 by Melony Overly, RN Outcome: Adequate for Discharge Goal: Will remain free from infection 08/03/2021 1010 by Melony Overly, RN Outcome: Adequate for Discharge 08/03/2021 1010 by Melony Overly, RN Outcome: Adequate for Discharge Goal: Diagnostic test results will improve 08/03/2021 1010 by Melony Overly, RN Outcome: Adequate for Discharge 08/03/2021 1010 by Melony Overly, RN Outcome: Adequate for Discharge Goal: Respiratory complications will improve 08/03/2021 1010 by Melony Overly, RN Outcome: Adequate for Discharge 08/03/2021 1010 by Melony Overly, RN Outcome: Adequate for Discharge Goal: Cardiovascular complication will be avoided 08/03/2021 1010 by Melony Overly, RN Outcome: Adequate for Discharge 08/03/2021 1010 by Melony Overly, RN Outcome: Adequate for Discharge   Problem: Activity: Goal: Risk for activity intolerance will decrease 08/03/2021 1010 by Melony Overly, RN Outcome: Adequate for Discharge 08/03/2021 1010 by Melony Overly, RN Outcome: Adequate for Discharge   Problem: Nutrition: Goal: Adequate nutrition will be maintained 08/03/2021 1010 by Melony Overly, RN Outcome:  Adequate for Discharge 08/03/2021 1010 by Melony Overly, RN Outcome: Adequate for Discharge   Problem: Coping: Goal: Level of anxiety will decrease 08/03/2021 1010 by Melony Overly, RN Outcome: Adequate for Discharge 08/03/2021 1010 by Melony Overly, RN Outcome: Adequate for Discharge   Problem: Elimination: Goal: Will not experience complications related to bowel motility 08/03/2021 1010 by Melony Overly, RN Outcome: Adequate for Discharge 08/03/2021 1010 by Melony Overly, RN Outcome: Adequate for Discharge Goal: Will not experience complications related to urinary retention 08/03/2021 1010 by Melony Overly, RN Outcome: Adequate for Discharge 08/03/2021 1010 by Melony Overly, RN Outcome: Adequate for Discharge   Problem: Pain Managment: Goal: General experience of comfort will improve 08/03/2021 1010 by Melony Overly, RN Outcome: Adequate for Discharge 08/03/2021 1010 by Melony Overly, RN Outcome: Adequate for Discharge   Problem: Safety: Goal: Ability to remain free from injury will improve 08/03/2021 1010 by Melony Overly, RN Outcome: Adequate for Discharge 08/03/2021 1010 by Melony Overly, RN Outcome: Adequate for Discharge   Problem: Skin Integrity: Goal: Risk for impaired skin integrity will decrease 08/03/2021 1010 by Melony Overly, RN Outcome: Adequate for Discharge 08/03/2021 1010 by Melony Overly, RN Outcome: Adequate for Discharge

## 2021-08-03 NOTE — Progress Notes (Signed)
Mamie Pereira (dtr) and Mel Lemmons (son) updated on pt's transfer to [facility] [address] and given room number. Thanked this Probation officer for information.

## 2021-08-03 NOTE — Discharge Summary (Signed)
Physician Discharge Summary  Derrick Long MLY:[id number] DOB: Dec 04, 1926 DOA: 08/01/2021  PCP: [facility]  Admit date: 08/01/2021  Discharge date: 08/03/2021  Admitted From:Home  Disposition:  Home  Recommendations for Outpatient Follow-up:  Follow up with PCP in 1-2 weeks Remain on PPI daily Xarelto may be resumed on 6/21 as noted below Continue on other home medications and avoid NSAIDs Follow-up with GI outpatient which will be scheduled  Home Health:None  Equipment/Devices:None  Discharge Condition:Stable  CODE STATUS: DNR  Diet recommendation: Heart Healthy  Brief/Interim Summary: Derrick Long is a 88 y.o. 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 Delton Coombes) who presents to the emergency department due to 2-day onset of having black tarry stools, abdominal pain, bloating and weakness.  He was admitted for evaluation of acute GI bleed and underwent EGD 6/17 with demonstrated slow bleeding Dieulafoy lesion that required APC and clips.  His hemoglobin remained stable and he had no further issues after endoscopy.  He was tolerating diet and was stable for discharge with recommendations as noted above.  Discharge Diagnoses:  Principal Problem:   GI bleed Active Problems:   Essential hypertension   PROSTATE CANCER, HX OF   Abdominal pain   History of atrial fibrillation   Hyponatremia   Macrocytic anemia   Mixed hyperlipidemia  Principal discharge diagnosis: Acute upper GI bleed with noted Dieulafoy lesion status post APC and clips on EGD 6/17.  Discharge Instructions  Discharge Instructions     Diet - low sodium heart healthy   Complete by: As directed    Increase activity slowly   Complete by: As directed       Allergies as of 08/03/2021   No Known Allergies      Medication List     TAKE these medications     acetaminophen 650 MG CR tablet Commonly known as: TYLENOL Take 1,300 mg by mouth every 8 (eight) hours as needed for pain.   ascorbic acid 250 MG tablet Commonly known as: VITAMIN C Take 250 mg by mouth 3 (three) times daily. Take with Iron (Ferrous Sulfate) to promote better absorption   atorvastatin 20 MG tablet Commonly known as: LIPITOR Take 1 tablet (20 mg total) by mouth daily at 6 PM.   carvedilol 3.125 MG tablet Commonly known as: Coreg Take 1 tablet (3.125 mg total) by mouth 2 (two) times daily.   Cholecalciferol 25 MCG (1000 UT) tablet Take 1,000 Units by mouth daily.   clotrimazole-betamethasone cream Commonly known as: Lotrisone Apply 1 application. topically 2 (two) times daily.   diclofenac sodium 1 % Gel Commonly known as: VOLTAREN Apply 2 g topically 4 (four) times daily. What changed:  when to take this reasons to take this   Ensure Take 237 mLs by mouth daily as needed (nutrition). Vanilla or chocolate   ferrous sulfate 324 MG Tbec Take 324 mg by mouth 2 (two) times daily.   fluticasone 50 MCG/ACT nasal spray Commonly known as: FLONASE Place 2 sprays into both nostrils daily. What changed:  when to take this reasons to take this   furosemide 40 MG tablet Commonly known as: LASIX Take 40 mg by mouth daily.   magnesium oxide 400 (240 Mg) MG tablet Commonly known as: MAG-OX Take 1 tablet (400 mg total) by mouth 3 (three) times daily.   megestrol 400 MG/10ML suspension Commonly known as: MEGACE Take 10 mLs (400 mg  total) by mouth 2 (two) times daily.   Nubeqa 300 MG tablet Generic drug: darolutamide Take 1 tablet (300 mg) by mouth twice daily for 2 weeks, then increase to 2 tablets (600 mg total) by mouth twice daily. Take with food.   pantoprazole 40 MG tablet Commonly known as: PROTONIX TAKE 1 TABLET BY MOUTH ONCE DAILY.   potassium chloride SA 20 MEQ tablet Commonly known as: KLOR-CON M Take 20 mEq by mouth daily.   predniSONE 5  MG tablet Commonly known as: DELTASONE Take 5 mg by mouth every morning. Continuous   rivaroxaban 20 MG Tabs tablet Commonly known as: XARELTO Take 1 tablet (20 mg total) by mouth daily with supper. Start taking on: August 06, 2021 What changed: These instructions start on August 06, 2021. If you are unsure what to do until then, ask your doctor or other care provider.   tamsulosin 0.4 MG Caps capsule Commonly known as: FLOMAX Take 1 capsule (0.4 mg total) by mouth daily after supper.   traMADol 50 MG tablet Commonly known as: ULTRAM Take 1 tablet (50 mg total) by mouth every 6 (six) hours as needed. What changed: reasons to take this        [place]. Schedule an appointment as soon as possible for a visit in 1 week(s).   Specialty: General Practice Contact information: [place] [address] [phone]         [facility]. Go to.   Contact information: [address] [place] [phone]               No Known Allergies  Consultations: GI   Procedures/Studies: CT ABDOMEN PELVIS W CONTRAST  Result Date: 08/01/2021 CLINICAL DATA:  Acute nonlocalized abdominal pain. Black stools and weakness. Abdominal pain for the last several days. EXAM: CT ABDOMEN AND PELVIS WITH CONTRAST TECHNIQUE: Multidetector CT imaging of the abdomen and pelvis was performed using the standard protocol following bolus administration of intravenous contrast. RADIATION DOSE REDUCTION: This exam was performed according to the departmental dose-optimization program which includes automated exposure control, adjustment of the mA and/or kV according to patient size and/or use of iterative reconstruction technique. CONTRAST:  50m OMNIPAQUE IOHEXOL 300 MG/ML  SOLN COMPARISON:  CT abdomen pelvis without contrast 02/19/2021, CT abdomen and pelvis with contrast 10/25/2020 FINDINGS: Lower chest:  Minimal curvilinear scarring versus subsegmental atelectasis within the inferior aspect of the middle lobe unchanged. Mild subsegmental atelectasis within the posterior left lower lobe. Heart size is mildly enlarged. Cardiac pacer wires are noted. Unchanged 4 mm anterolateral right lower lobe nodule compared to 02/19/2021. Hepatobiliary: Smooth liver contours. No focal liver mass is identified. Status post cholecystectomy. Mild central intrahepatic biliary ductal dilatation appears unchanged likely postsurgical. The common bile duct measures up to approximately 7 mm, unchanged from prior and within normal limits given post cholecystectomy and patient age. Pancreas: No mass or inflammatory fat stranding. No pancreatic ductal dilatation is seen. Spleen: Normal in size without focal abnormality. Adrenals/Urinary Tract: Unchanged right adrenal nodule measuring up to approximately 11 x 19 mm (axial image 21). Normal left adrenal gland. There are numerous bilateral renal lesions. Some of these demonstrate fluid density suggesting cysts. There is an apparent partially enhancing mass extending exophytically from the lower pole of the right kidney measuring up to 2.3 x 1.9 by 2.7 cm (axial image 32 and coronal image 45) concerning for enhancing mass of renal cell carcinoma. This  is a similar size 04/01/2021 noncontrast CT when this measured 17 Hounsfield units centrally. Within the far inferior aspect of the lower pole there is an exophytic 2.0 cm centrally low dense lesion and peripherally enhancing lesion that may represent a cyst versus neoplasm. This measures 33 Hounsfield units centrally. Within the upper pole of the left kidney there is a partially enhancing mass measuring 2.2 x 2.6 x 2.3 cm (axial image 18 and coronal image 46). This is also suspicious for a solid enhancing mass of renal cell carcinoma. This is not significant changed in size from 02/19/2021 noncontrast CT when the central aspect measured 17  Hounsfield units. No focal urinary bladder wall thickening. Stomach/Bowel: A diverticulum is seen within the sigmoid colon without inflammatory change. Moderate to high-grade stool is seen throughout the colon. Surgical suture is seen in the region of the proximal ascending colon, an ileocolic anastomosis. No dilated loops of bowel to indicate bowel obstruction. Vascular/Lymphatic: No abdominal aortic aneurysm. Moderate to high-grade atherosclerotic calcifications. The right common iliac artery is distended up to 2.0 cm in the left common iliac artery is distended up to 1.8 cm. No mesenteric, retroperitoneal, or pelvic lymphadenopathy. Reproductive: The prostate gland is again enlarged and contains brachytherapy seeds. The seminal vesicles are grossly unremarkable. Other: Small fat containing umbilical hernia.No abdominopelvic ascites. No pneumoperitoneum. Musculoskeletal: Mild levocurvature of the mid to upper lumbar spine. Severe right L2-3 disc space narrowing and endplate degenerative changes. IMPRESSION: Compared to 02/19/2021: 1. No CT explanation for the patient's abdominal pain. 2. Bilateral enhancing renal masses again suspicious for renal cell carcinoma. 3. Postsurgical changes of prior right hemicolectomy and ileocolic anastomosis. No bowel obstruction. 4. Additional chronic findings as above. Electronically Signed   By: Yvonne Kendall M.D.   On: 08/01/2021 18:54   XR Shoulder Left  Result Date: 08/01/2021 AP, scapular Y, axillary views of left shoulder reviewed.  Minimal degenerative changes of the acromioclavicular and glenohumeral joints.  No fracture or dislocation noted.  No significant loss of acromiohumeral interval.    Discharge Exam: Vitals:   08/03/21 0408 08/03/21 0436  BP: (!) 172/92 (!) 160/65  Pulse: (!) 58 72  Resp: (!) 21 18  Temp:  98 F (36.7 C)  SpO2: 100% 99%   Vitals:   08/02/21 2100 08/03/21 0400 08/03/21 0408 08/03/21 0436  BP: (!) 164/75 (!) 183/147 (!) 172/92 (!)  160/65  Pulse:  64 (!) 58 72  Resp: 18 18 (!) 21 18  Temp:    98 F (36.7 C)  TempSrc:    Oral  SpO2:  97% 100% 99%  Weight:      Height:        General: Pt is alert, awake, not in acute distress Cardiovascular: RRR, S1/S2 +, no rubs, no gallops Respiratory: CTA bilaterally, no wheezing, no rhonchi Abdominal: Soft, NT, ND, bowel sounds + Extremities: no edema, no cyanosis    The results of significant diagnostics from this hospitalization (including imaging, microbiology, ancillary and laboratory) are listed below for reference.     Microbiology: No results found for this or any previous visit (from the past 240 hour(s)).   Labs: BNP (last 3 results) No results for input(s): "BNP" in the last 8760 hours. Basic Metabolic Panel: Recent Labs  Lab 08/01/21 1309 08/02/21 0344 08/03/21 0559  NA 127* 131* 133*  K 5.0 4.5 4.3  CL 96* 101 108  CO2 23 22 20*  GLUCOSE 114* 89 93  BUN 45* 40* 27*  CREATININE 1.75* 1.58*  1.33*  CALCIUM 9.3 8.8* 8.8*  MG  --  2.3 2.0  PHOS  --  4.0  --    Liver Function Tests: Recent Labs  Lab 08/01/21 1309 08/02/21 0344  AST 25 21  ALT 28 24  ALKPHOS 59 50  BILITOT 0.7 1.0  PROT 7.4 6.5  ALBUMIN 3.8 3.3*   Recent Labs  Lab 08/01/21 1309  LIPASE 48   No results for input(s): "AMMONIA" in the last 168 hours. CBC: Recent Labs  Lab 08/01/21 1309 08/02/21 0344 08/03/21 0559  WBC 6.5 6.8 6.6  HGB 12.5* 12.0* 11.8*  HCT 35.7* 34.7* 34.3*  MCV 100.0 100.3* 100.3*  PLT 336 311 284   Cardiac Enzymes: No results for input(s): "CKTOTAL", "CKMB", "CKMBINDEX", "TROPONINI" in the last 168 hours. BNP: Invalid input(s): "POCBNP" CBG: No results for input(s): "GLUCAP" in the last 168 hours. D-Dimer No results for input(s): "DDIMER" in the last 72 hours. Hgb A1c No results for input(s): "HGBA1C" in the last 72 hours. Lipid Profile No results for input(s): "CHOL", "HDL", "LDLCALC", "TRIG", "CHOLHDL", "LDLDIRECT" in the last 72  hours. Thyroid function studies Recent Labs    08/02/21 1359  TSH 1.242   Anemia work up Recent Labs    08/02/21 0344  VITAMINB12 803  FOLATE 12.1   Urinalysis    Component Value Date/Time   COLORURINE STRAW (A) 08/01/2021 1303   APPEARANCEUR CLEAR 08/01/2021 1303   APPEARANCEUR Clear 07/17/2021 0939   LABSPEC 1.010 08/01/2021 1303   PHURINE 6.0 08/01/2021 1303   GLUCOSEU NEGATIVE 08/01/2021 1303   HGBUR NEGATIVE 08/01/2021 1303   HGBUR negative 06/24/2007 0833   BILIRUBINUR NEGATIVE 08/01/2021 1303   BILIRUBINUR Negative 07/17/2021 0939   KETONESUR NEGATIVE 08/01/2021 1303   PROTEINUR 30 (A) 08/01/2021 1303   UROBILINOGEN 1.0 07/29/2013 2032   NITRITE NEGATIVE 08/01/2021 1303   LEUKOCYTESUR NEGATIVE 08/01/2021 1303   Sepsis Labs Recent Labs  Lab 08/01/21 1309 08/02/21 0344 08/03/21 0559  WBC 6.5 6.8 6.6   Microbiology No results found for this or any previous visit (from the past 240 hour(s)).   Time coordinating discharge: 35 minutes  SIGNED:   Rodena Goldmann, DO [facility] 08/03/2021, 10:00 AM  If 7PM-7AM, please contact night-coverage [email or web address]

## 2021-08-05 ENCOUNTER — Encounter (HOSPITAL_COMMUNITY): Payer: Self-pay | Admitting: Internal Medicine

## 2021-08-05 LAB — SURGICAL PATHOLOGY

## 2021-08-06 ENCOUNTER — Encounter: Payer: Self-pay | Admitting: Internal Medicine

## 2021-08-07 ENCOUNTER — Telehealth (INDEPENDENT_AMBULATORY_CARE_PROVIDER_SITE_OTHER): Payer: Self-pay | Admitting: *Deleted

## 2021-08-07 NOTE — Telephone Encounter (Signed)
error 

## 2021-08-13 ENCOUNTER — Other Ambulatory Visit (HOSPITAL_COMMUNITY): Payer: Medicare Other

## 2021-08-13 ENCOUNTER — Other Ambulatory Visit (HOSPITAL_COMMUNITY): Payer: Self-pay

## 2021-08-13 ENCOUNTER — Inpatient Hospital Stay (HOSPITAL_COMMUNITY): Payer: Medicare Other | Admitting: Hematology

## 2021-08-15 ENCOUNTER — Other Ambulatory Visit (HOSPITAL_COMMUNITY): Payer: Self-pay

## 2021-08-15 ENCOUNTER — Other Ambulatory Visit (HOSPITAL_COMMUNITY): Payer: Self-pay | Admitting: Hematology

## 2021-08-15 DIAGNOSIS — C61 Malignant neoplasm of prostate: Secondary | ICD-10-CM

## 2021-08-15 MED ORDER — NUBEQA 300 MG PO TABS
300.0000 mg | ORAL_TABLET | Freq: Two times a day (BID) | ORAL | 0 refills | Status: DC
  Filled 2021-08-15 (×2): qty 60, 30d supply, fill #0

## 2021-08-15 NOTE — Telephone Encounter (Signed)
Reviewed chart, per last office note, refilled Nubequa at 300 mg twice daily.

## 2021-08-18 ENCOUNTER — Other Ambulatory Visit (HOSPITAL_COMMUNITY): Payer: Self-pay

## 2021-08-22 ENCOUNTER — Encounter (HOSPITAL_COMMUNITY): Payer: Self-pay

## 2021-08-22 ENCOUNTER — Emergency Department (HOSPITAL_COMMUNITY): Payer: No Typology Code available for payment source

## 2021-08-22 ENCOUNTER — Emergency Department (HOSPITAL_COMMUNITY)
Admission: EM | Admit: 2021-08-22 | Discharge: 2021-08-22 | Disposition: A | Discharge status: Home / Self Care | Payer: No Typology Code available for payment source | Attending: Student | Admitting: Student

## 2021-08-22 ENCOUNTER — Other Ambulatory Visit: Payer: Self-pay

## 2021-08-22 DIAGNOSIS — W010XXA Fall on same level from slipping, tripping and stumbling without subsequent striking against object, initial encounter: Secondary | ICD-10-CM | POA: Diagnosis not present

## 2021-08-22 DIAGNOSIS — M25511 Pain in right shoulder: Secondary | ICD-10-CM | POA: Diagnosis present

## 2021-08-22 DIAGNOSIS — Y9389 Activity, other specified: Secondary | ICD-10-CM | POA: Diagnosis not present

## 2021-08-22 DIAGNOSIS — S42031A Displaced fracture of lateral end of right clavicle, initial encounter for closed fracture: Secondary | ICD-10-CM | POA: Diagnosis not present

## 2021-08-22 DIAGNOSIS — Y92828 Other wilderness area as the place of occurrence of the external cause: Secondary | ICD-10-CM | POA: Insufficient documentation

## 2021-08-22 DIAGNOSIS — Z7901 Long term (current) use of anticoagulants: Secondary | ICD-10-CM | POA: Diagnosis not present

## 2021-08-22 MED ORDER — HYDROCODONE-ACETAMINOPHEN 5-325 MG PO TABS
1.0000 | ORAL_TABLET | Freq: Once | ORAL | Status: AC
  Administered 2021-08-22: 1 via ORAL
  Filled 2021-08-22: qty 1

## 2021-08-22 NOTE — Discharge Instructions (Signed)
Please follow-up with orthopedic surgery for further evaluation.  Please return to the emergency department for any worsening symptoms.

## 2021-08-22 NOTE — ED Provider Notes (Signed)
[facility] EMERGENCY DEPARTMENT Provider Note   CSN: [id number] Arrival date & time: 08/22/21  2059     History Chief Complaint  Patient presents with   Lytle Michaels    Derrick Long is a 88 y.o. male with history of atrial fibrillation on Xarelto who presents to the emergency department after mechanical trip and fall while he was tending to his garden.  Patient states that his [place] and upon getting up after picking some vegetables he lost his balance and fell onto his right side.  He did not hit his head or lose conscious.  Denies any headache or neck pain.  Pain is primarily localized to the right shoulder.   Fall       Home Medications Prior to Admission medications   Medication Sig Start Date End Date Taking? Authorizing Provider  ascorbic acid (VITAMIN C) 250 MG tablet Take 250 mg by mouth 3 (three) times daily. Take with Iron (Ferrous Sulfate) to promote better absorption 03/18/21  Yes [provider]  atorvastatin (LIPITOR) 40 MG tablet TAKE ONE-HALF TABLET BY MOUTH AT BEDTIME FOR CHOLESTEROL 08/13/21  Yes [provider]  carvedilol (COREG) 3.125 MG tablet Take 1 tablet (3.125 mg total) by mouth 2 (two) times daily. 03/28/21 05/15/23 Yes Antonieta Pert, MD  Cholecalciferol 25 MCG (1000 UT) tablet Take 1,000 Units by mouth daily.   Yes [provider]  clotrimazole-betamethasone (LOTRISONE) cream Apply 1 application. topically 2 (two) times daily. 05/21/21  Yes McKenzie, Candee Furbish, MD  Cyanocobalamin 1000 MCG CAPS Take 1 tablet by mouth daily. 11/03/13  Yes [provider]  darolutamide (NUBEQA) 300 MG tablet Take 1 tablet (300 mg total) by mouth 2 (two) times daily with a meal. 08/15/21  Yes Derek Jack, MD  diclofenac sodium (VOLTAREN) 1 % GEL Apply 2 g topically 4 (four) times daily. Patient taking differently: Apply 2 g topically 4 (four) times daily as needed (pain). 06/19/17  Yes Rancour, Annie Main, MD  Ensure (ENSURE) Take  237 mLs by mouth daily as needed (nutrition). Vanilla or chocolate   Yes [provider]  ferrous sulfate 324 MG TBEC Take 324 mg by mouth 2 (two) times daily.   Yes [provider]  fluticasone (FLONASE) 50 MCG/ACT nasal spray Place 2 sprays into both nostrils daily. Patient taking differently: Place 2 sprays into both nostrils daily as needed for allergies. 02/15/16  Yes Ward, Delice Bison, DO  furosemide (LASIX) 40 MG tablet Take 40 mg by mouth daily. 03/18/21  Yes [provider]  magnesium oxide (MAG-OX) 400 (240 Mg) MG tablet Take 1 tablet (400 mg total) by mouth 3 (three) times daily. 06/02/21  Yes Derek Jack, MD  megestrol (MEGACE) 400 MG/10ML suspension Take 10 mLs (400 mg total) by mouth 2 (two) times daily. 03/20/21  Yes Derek Jack, MD  pantoprazole (PROTONIX) 40 MG tablet TAKE 1 TABLET BY MOUTH ONCE DAILY. Patient taking differently: Take 40 mg by mouth daily. 07/22/21  Yes Derek Jack, MD  potassium chloride SA (KLOR-CON) 20 MEQ tablet Take 20 mEq by mouth daily. 02/28/20  Yes [provider]  predniSONE (DELTASONE) 5 MG tablet Take 5 mg by mouth every morning. Continuous 06/12/21  Yes [provider]  rivaroxaban (XARELTO) 20 MG TABS tablet Take 1 tablet (20 mg total) by mouth daily with supper. 08/06/21  Yes Manuella Ghazi, Pratik D, DO  tamsulosin (FLOMAX) 0.4 MG CAPS capsule Take 1 capsule (0.4 mg total) by mouth daily after supper.  05/07/21  Yes McKenzie, Candee Furbish, MD  traMADol (ULTRAM) 50 MG tablet Take 1 tablet (50 mg total) by mouth every 6 (six) hours as needed. Patient taking differently: Take 50 mg by mouth every 6 (six) hours as needed for severe pain. 05/24/21  Yes Daleen Bo, MD  acetaminophen (TYLENOL) 650 MG CR tablet Take 1,300 mg by mouth every 8 (eight) hours as needed for pain.    [provider]      Allergies    Patient has no known allergies.    Review of Systems   Review of Systems  All other  systems reviewed and are negative.   Physical Exam Updated Vital Signs BP (!) 159/67   Pulse 69   Temp 98.4 F (36.9 C) (Oral)   Resp 17   Ht '6\' 1"'$  (1.854 m)   Wt 79.9 kg   SpO2 99%   BMI 23.24 kg/m  Physical Exam Vitals and nursing note reviewed.  Constitutional:      Appearance: Normal appearance.  HENT:     Head: Normocephalic and atraumatic.  Eyes:     General:        Right eye: No discharge.        Left eye: No discharge.     Conjunctiva/sclera: Conjunctivae normal.  Pulmonary:     Effort: Pulmonary effort is normal.  Skin:    General: Skin is warm and dry.     Findings: No rash.  Neurological:     General: No focal deficit present.     Mental Status: He is alert and oriented to person, place, and time.     GCS: GCS eye subscore is 4. GCS verbal subscore is 5. GCS motor subscore is 6.     Comments: Answers all questions appropriately.  Psychiatric:        Mood and Affect: Mood normal.        Behavior: Behavior normal.     ED Results / Procedures / Treatments   Labs (all labs ordered are listed, but only abnormal results are displayed) Labs Reviewed - No data to display  EKG None  Radiology CT Head Wo Contrast  Result Date: 08/22/2021 CLINICAL DATA:  Head trauma EXAM: CT HEAD WITHOUT CONTRAST TECHNIQUE: Contiguous axial images were obtained from the base of the skull through the vertex without intravenous contrast. RADIATION DOSE REDUCTION: This exam was performed according to the departmental dose-optimization program which includes automated exposure control, adjustment of the mA and/or kV according to patient size and/or use of iterative reconstruction technique. COMPARISON:  04/15/2021 FINDINGS: Brain: There is no mass, hemorrhage or extra-axial collection. There is generalized atrophy without lobar predilection. Hypodensity of the white matter is most commonly associated with chronic microvascular disease. Vascular: Atherosclerotic calcification of the  internal carotid arteries at the skull base. No abnormal hyperdensity of the major intracranial arteries or dural venous sinuses. Skull: The visualized skull base, calvarium and extracranial soft tissues are normal. Sinuses/Orbits: No fluid levels or advanced mucosal thickening of the visualized paranasal sinuses. No mastoid or middle ear effusion. The orbits are normal. IMPRESSION: 1. No acute intracranial abnormality. 2. Chronic microvascular ischemia and generalized atrophy. Electronically Signed   By: Ulyses Jarred M.D.   On: 08/22/2021 22:02   DG Shoulder Right  Result Date: 08/22/2021 CLINICAL DATA:  Recent fall with right shoulder pain, initial encounter EXAM: RIGHT SHOULDER - 2+ VIEW COMPARISON:  05/24/2021 FINDINGS: Comminuted fracture of the distal right clavicle is noted. No acromioclavicular separation is seen.  Mild degenerative changes of the glenohumeral joint are seen. Underlying bony thorax appears within normal limits. IMPRESSION: Fracture involving the distal right clavicle. Electronically Signed   By: Inez Catalina M.D.   On: 08/22/2021 21:41    Procedures Procedures    Medications Ordered in ED Medications  HYDROcodone-acetaminophen (NORCO/VICODIN) 5-325 MG per tablet 1 tablet (has no administration in time range)    ED Course/ Medical Decision Making/ A&P Clinical Course as of 08/22/21 2252  Fri Aug 22, 2021  2250 CT Head Wo Contrast I personally ordered and interpreted this image.  No evidence of intracranial hemorrhage. [CF]  2250 DG Shoulder Right I personally reviewed this image.  There is evidence of a distal clavicle fracture. [CF]    Clinical Course User Index [CF] Hendricks Limes, PA-C                           Medical Decision Making DAJOUR PIERPOINT is a 88 y.o. male patient presents to the emerged apartment for further evaluation of a fall and right shoulder pain.  Given the patient's age and mechanism I will get an x-ray of the right shoulder in addition  to a CT head although he did not hit his head patient is on a blood thinner.  Patient alert and oriented x3 and in no acute distress at this time.   Amount and/or Complexity of Data Reviewed Radiology: ordered. Decision-making details documented in ED Course.  Risk Prescription drug management. Risk Details: We will give patient 1 Norco.  Patient was placed in the sling for comfort and support of the right upper extremity.  Patient was notified of the images and the clavicle fracture.  I will have him follow-up with orthopedics for further evaluation.    Final Clinical Impression(s) / ED Diagnoses Final diagnoses:  Closed displaced fracture of acromial end of right clavicle, initial encounter    Rx / DC Orders ED Discharge Orders     None         Cherrie Gauze 08/22/21 2252    Teressa Lower, MD 08/24/21 0150

## 2021-08-22 NOTE — ED Triage Notes (Addendum)
Pt BIB [facility] from home. Pt was out in his garden when he lost his balance falling landing on his right side. Denies hitting head or LOC, c/o right shoulder pain. Pt states he does take a blood thinner. A&Ox4 in triage.

## 2021-08-25 ENCOUNTER — Telehealth: Payer: Self-pay | Admitting: Orthopedic Surgery

## 2021-08-25 NOTE — Telephone Encounter (Signed)
Pt daughter called and was wondering if we can work him into [employer] schedule since he took a fall on the 7th

## 2021-08-26 ENCOUNTER — Inpatient Hospital Stay (HOSPITAL_COMMUNITY): Payer: Medicare Other | Attending: Hematology

## 2021-08-26 ENCOUNTER — Inpatient Hospital Stay (HOSPITAL_BASED_OUTPATIENT_CLINIC_OR_DEPARTMENT_OTHER): Payer: Medicare Other | Admitting: Hematology

## 2021-08-26 ENCOUNTER — Inpatient Hospital Stay (HOSPITAL_COMMUNITY): Payer: Medicare Other

## 2021-08-26 VITALS — BP 121/76 | HR 81 | Temp 97.8°F | Resp 16 | Ht 73.0 in | Wt 181.3 lb

## 2021-08-26 DIAGNOSIS — C7971 Secondary malignant neoplasm of right adrenal gland: Secondary | ICD-10-CM | POA: Insufficient documentation

## 2021-08-26 DIAGNOSIS — C61 Malignant neoplasm of prostate: Secondary | ICD-10-CM | POA: Diagnosis present

## 2021-08-26 DIAGNOSIS — Z5111 Encounter for antineoplastic chemotherapy: Secondary | ICD-10-CM | POA: Diagnosis present

## 2021-08-26 LAB — CBC WITH DIFFERENTIAL/PLATELET
Abs Immature Granulocytes: 0.04 10*3/uL (ref 0.00–0.07)
Basophils Absolute: 0 10*3/uL (ref 0.0–0.1)
Basophils Relative: 0 %
Eosinophils Absolute: 0 10*3/uL (ref 0.0–0.5)
Eosinophils Relative: 1 %
HCT: 33 % — ABNORMAL LOW (ref 39.0–52.0)
Hemoglobin: 11.3 g/dL — ABNORMAL LOW (ref 13.0–17.0)
Immature Granulocytes: 1 %
Lymphocytes Relative: 23 %
Lymphs Abs: 1.4 10*3/uL (ref 0.7–4.0)
MCH: 34.9 pg — ABNORMAL HIGH (ref 26.0–34.0)
MCHC: 34.2 g/dL (ref 30.0–36.0)
MCV: 101.9 fL — ABNORMAL HIGH (ref 80.0–100.0)
Monocytes Absolute: 0.5 10*3/uL (ref 0.1–1.0)
Monocytes Relative: 9 %
Neutro Abs: 3.9 10*3/uL (ref 1.7–7.7)
Neutrophils Relative %: 66 %
Platelets: 199 10*3/uL (ref 150–400)
RBC: 3.24 MIL/uL — ABNORMAL LOW (ref 4.22–5.81)
RDW: 12.8 % (ref 11.5–15.5)
WBC: 5.9 10*3/uL (ref 4.0–10.5)
nRBC: 0 % (ref 0.0–0.2)

## 2021-08-26 LAB — COMPREHENSIVE METABOLIC PANEL
ALT: 22 U/L (ref 0–44)
AST: 20 U/L (ref 15–41)
Albumin: 3.4 g/dL — ABNORMAL LOW (ref 3.5–5.0)
Alkaline Phosphatase: 53 U/L (ref 38–126)
Anion gap: 5 (ref 5–15)
BUN: 34 mg/dL — ABNORMAL HIGH (ref 8–23)
CO2: 22 mmol/L (ref 22–32)
Calcium: 8.9 mg/dL (ref 8.9–10.3)
Chloride: 103 mmol/L (ref 98–111)
Creatinine, Ser: 1.74 mg/dL — ABNORMAL HIGH (ref 0.61–1.24)
GFR, Estimated: 36 mL/min — ABNORMAL LOW (ref >60)
Glucose, Bld: 106 mg/dL — ABNORMAL HIGH (ref 70–99)
Potassium: 4.2 mmol/L (ref 3.5–5.1)
Sodium: 130 mmol/L — ABNORMAL LOW (ref 135–145)
Total Bilirubin: 1.1 mg/dL (ref 0.3–1.2)
Total Protein: 6.7 g/dL (ref 6.5–8.1)

## 2021-08-26 LAB — MAGNESIUM: Magnesium: 1.9 mg/dL (ref 1.7–2.4)

## 2021-08-26 MED ORDER — PREDNISONE 5 MG PO TABS: 5.0000 mg | ORAL_TABLET | Freq: Every morning | ORAL | 3 refills | Status: DC

## 2021-08-26 MED ORDER — LEUPROLIDE ACETATE (6 MONTH) 45 MG ~~LOC~~ KIT
45.0000 mg | PACK | Freq: Once | SUBCUTANEOUS | Status: AC
  Administered 2021-08-26: 45 mg via SUBCUTANEOUS
  Filled 2021-08-26: qty 45

## 2021-08-26 NOTE — Patient Instructions (Signed)
[facility]  Discharge Instructions: Thank you for choosing [facility] to provide your oncology and hematology care.  If you have a lab appointment with the [facility], please come in thru the Main Entrance and check in at the main information desk.  Wear comfortable clothing and clothing appropriate for easy access to any Portacath or PICC line.   We strive to give you quality time with your provider. You may need to reschedule your appointment if you arrive late (15 or more minutes).  Arriving late affects you and other patients whose appointments are after yours.  Also, if you miss three or more appointments without notifying the office, you may be dismissed from the clinic at the provider's discretion.      For prescription refill requests, have your pharmacy contact our office and allow 72 hours for refills to be completed.    Today you received the following chemotherapy and/or immunotherapy agents eligard.  Leuprolide depot injection What is this medication? LEUPROLIDE (loo PROE lide) is a man-made protein that acts like a natural hormone in the body. It decreases testosterone in men and decreases estrogen in women. In men, this medicine is used to treat advanced prostate cancer. In women, some forms of this medicine may be used to treat endometriosis, uterine fibroids, or other male hormone-related problems. This medicine may be used for other purposes; ask your health care provider or pharmacist if you have questions. COMMON BRAND NAME(S): Eligard, Fensolv, Lupron Depot, Lupron Depot-Ped, Viadur What should I tell my care team before I take this medication? They need to know if you have any of these conditions: diabetes heart disease or previous heart attack high blood pressure high cholesterol mental illness osteoporosis pain or difficulty passing urine seizures spinal cord metastasis stroke suicidal thoughts, plans, or attempt; a previous  suicide attempt by you or a family member tobacco smoker unusual vaginal bleeding (women) an unusual or allergic reaction to leuprolide, benzyl alcohol, other medicines, foods, dyes, or preservatives pregnant or trying to get pregnant breast-feeding How should I use this medication? This medicine is for injection into a muscle or for injection under the skin. It is given by a health care professional in a hospital or clinic setting. The specific product will determine how it will be given to you. Make sure you understand which product you receive and how often you will receive it. Talk to your pediatrician regarding the use of this medicine in children. Special care may be needed. Overdosage: If you think you have taken too much of this medicine contact a poison control center or emergency room at once. NOTE: This medicine is only for you. Do not share this medicine with others. What if I miss a dose? It is important not to miss a dose. Call your doctor or health care professional if you are unable to keep an appointment. Depot injections: Depot injections are given either once-monthly, every 12 weeks, every 16 weeks, or every 24 weeks depending on the product you are prescribed. The product you are prescribed will be based on if you are male or male, and your condition. Make sure you understand your product and dosing. What may interact with this medication? Do not take this medicine with any of the following medications: chasteberry cisapride dronedarone pimozide thioridazine This medicine may also interact with the following medications: herbal or dietary supplements, like black cohosh or DHEA male hormones, like estrogens or progestins and birth control pills, patches, rings, or injections  male hormones, like testosterone other medicines that prolong the QT interval (abnormal heart rhythm) This list may not describe all possible interactions. Give your health care provider a list of  all the medicines, herbs, non-prescription drugs, or dietary supplements you use. Also tell them if you smoke, drink alcohol, or use illegal drugs. Some items may interact with your medicine. What should I watch for while using this medication? Visit your doctor or health care professional for regular checks on your progress. During the first weeks of treatment, your symptoms may get worse, but then will improve as you continue your treatment. You may get hot flashes, increased bone pain, increased difficulty passing urine, or an aggravation of nerve symptoms. Discuss these effects with your doctor or health care professional, some of them may improve with continued use of this medicine. Male patients may experience a menstrual cycle or spotting during the first months of therapy with this medicine. If this continues, contact your doctor or health care professional. This medicine may increase blood sugar. Ask your healthcare provider if changes in diet or medicines are needed if you have diabetes. What side effects may I notice from receiving this medication? Side effects that you should report to your doctor or health care professional as soon as possible: allergic reactions like skin rash, itching or hives, swelling of the face, lips, or tongue breathing problems chest pain depression or memory disorders pain in your legs or groin pain at site where injected or implanted seizures severe headache signs and symptoms of high blood sugar such as being more thirsty or hungry or having to urinate more than normal. You may also feel very tired or have blurry vision swelling of the feet and legs suicidal thoughts or other mood changes visual changes vomiting Side effects that usually do not require medical attention (report to your doctor or health care professional if they continue or are bothersome): breast swelling or tenderness decrease in sex drive or performance diarrhea hot flashes loss  of appetite muscle, joint, or bone pains nausea redness or irritation at site where injected or implanted skin problems or acne This list may not describe all possible side effects. Call your doctor for medical advice about side effects. You may report side effects to FDA at 1-800-FDA-1088. Where should I keep my medication? This drug is given in a hospital or clinic and will not be stored at home. NOTE: This sheet is a summary. It may not cover all possible information. If you have questions about this medicine, talk to your doctor, pharmacist, or health care provider.  2023 Elsevier/Gold Standard (2021-01-03 00:00:00)       To help prevent nausea and vomiting after your treatment, we encourage you to take your nausea medication as directed.  BELOW ARE SYMPTOMS THAT SHOULD BE REPORTED IMMEDIATELY: *FEVER GREATER THAN 100.4 F (38 C) OR HIGHER *CHILLS OR SWEATING *NAUSEA AND VOMITING THAT IS NOT CONTROLLED WITH YOUR NAUSEA MEDICATION *UNUSUAL SHORTNESS OF BREATH *UNUSUAL BRUISING OR BLEEDING *URINARY PROBLEMS (pain or burning when urinating, or frequent urination) *BOWEL PROBLEMS (unusual diarrhea, constipation, pain near the anus) TENDERNESS IN MOUTH AND THROAT WITH OR WITHOUT PRESENCE OF ULCERS (sore throat, sores in mouth, or a toothache) UNUSUAL RASH, SWELLING OR PAIN  UNUSUAL VAGINAL DISCHARGE OR ITCHING   Items with * indicate a potential emergency and should be followed up as soon as possible or go to the Emergency Department if any problems should occur.  Please show the CHEMOTHERAPY ALERT CARD or IMMUNOTHERAPY ALERT  CARD at check-in to the Emergency Department and triage nurse.  Should you have questions after your visit or need to cancel or reschedule your appointment, please contact [facility] [phone]  and follow the prompts.  Office hours are 8:00 a.m. to 4:30 p.m. Monday - Friday. Please note that voicemails left after 4:00 p.m. may not be returned until  the following business day.  We are closed weekends and major holidays. You have access to a nurse at all times for urgent questions. Please call the main number to the clinic [phone] and follow the prompts.  For any non-urgent questions, you may also contact your provider using MyChart. We now offer e-Visits for anyone 61 and older to request care online for non-urgent symptoms. For details visit [email or web address].   Also download the MyChart app! Go to the app store, search "MyChart", open the app, select , and log in with your MyChart username and password.  Masks are optional in the cancer centers. If you would like for your care team to wear a mask while they are taking care of you, please let them know. For doctor visits, patients may have with them one support person who is at least 86 years old. At this time, visitors are not allowed in the infusion area.

## 2021-08-26 NOTE — Progress Notes (Signed)
[place], [name]   CLINIC:  Medical Oncology/Hematology  PCP:  [facility] [place] / [place] [address] [phone]   REASON FOR VISIT:  Follow-up for metastatic prostate cancer to the right adrenal gland  PRIOR THERAPY: none  NGS Results: not done  CURRENT THERAPY: Lupron every 6 months  BRIEF ONCOLOGIC HISTORY:  Oncology History   No history exists.    CANCER STAGING: Cancer Staging  Prostate cancer [facility]) Staging form: Prostate, AJCC 8th Edition - Clinical stage from 07/10/2020: Stage IVB (cTX, cNX, pM1c, PSA: 10.2) - Unsigned   INTERVAL HISTORY:  Derrick Long, a 88 y.o. male, returns for routine follow-up of his bmetastatic prostate cancer to the right adrenal gland. Derrick Long was last seen on 07/21/2021.   Today he reports feeling good, and he is accompanied by his 2 daughters. He reports 1 fall since his last visit at which time he fractured his right clavicle for which he presented to the ED on 7/7. He reports his back continues to occur intermittently and was improved. He reports dizziness upon standing. He continues to take megace and tramadol. He reports disrupted sleep.  REVIEW OF SYSTEMS:  Review of Systems  Constitutional:  Negative for appetite change and fatigue.  Musculoskeletal:  Positive for back pain.  Neurological:  Positive for dizziness.  Psychiatric/Behavioral:  Positive for depression and sleep disturbance.   All other systems reviewed and are negative.   PAST MEDICAL/SURGICAL HISTORY:  Past Medical History:  Diagnosis Date   Acid reflux    Anemia    Colon cancer ([place])    per patient, colon cancer more than 10 years ago s/p right hemicolectomy   Ex-cigarette smoker    Gout    Hyperlipemia    Hypertension    Paroxysmal atrial fibrillation ([place]) 2014   Prostate cancer ([place]) 08/08/2013   SEEDS 20 YEARS AGO   Prostate disease    Past Surgical History:   Procedure Laterality Date   BIOPSY  02/05/2021   Procedure: BIOPSY;  Surgeon: Harvel Quale, MD;  [facility];  Service: Gastroenterology;;   BIOPSY  08/02/2021   Procedure: BIOPSY;  Surgeon: Daneil Dolin, MD;  [facility];  Service: Endoscopy;;   COLON SURGERY     Right hemicolectomy in the setting of colon cancer, per patient   CYSTOSCOPY WITH INSERTION OF UROLIFT N/A 05/19/2021   Procedure: CYSTOSCOPY WITH INSERTION OF UROLIFT;  Surgeon: Cleon Gustin, MD;  Location: [facility] ORS;  Service: Urology;  Laterality: N/A;   ESOPHAGOGASTRODUODENOSCOPY (EGD) WITH PROPOFOL N/A 02/05/2021   Procedure: ESOPHAGOGASTRODUODENOSCOPY (EGD) WITH PROPOFOL;  Surgeon: Harvel Quale, MD;  [facility];  Service: Gastroenterology;  Laterality: N/A;   ESOPHAGOGASTRODUODENOSCOPY (EGD) WITH PROPOFOL N/A 08/02/2021   Procedure: ESOPHAGOGASTRODUODENOSCOPY (EGD) WITH PROPOFOL;  Surgeon: Daneil Dolin, MD;  [facility];  Service: Endoscopy;  Laterality: N/A;   HOT HEMOSTASIS  08/02/2021   Procedure: HOT HEMOSTASIS (ARGON PLASMA COAGULATION/BICAP);  Surgeon: Daneil Dolin, MD;  [facility];  Service: Endoscopy;;   LEFT HEART CATHETERIZATION WITH CORONARY ANGIOGRAM N/A 07/21/2012   Procedure: LEFT HEART CATHETERIZATION WITH CORONARY ANGIOGRAM;  Surgeon: Troy Sine, MD;  Location: [facility] CATH LAB;  Service: Cardiovascular;  Laterality: N/A;   PACEMAKER INSERTION     PERMANENT PACEMAKER INSERTION N/A 07/22/2012   Procedure: PERMANENT PACEMAKER INSERTION;  Surgeon: Sanda Klein, MD;  Location: [facility]  CATH LAB;  Service: Cardiovascular;  Laterality: N/A;   PROSTATE SURGERY      SOCIAL HISTORY:  Social History   Socioeconomic History   Marital status: Married    Spouse name: Not on file   Number of children: Not on file   Years of education: Not on file   Highest education level: Not on file  Occupational History   Not on file   Tobacco Use   Smoking status: Former    Types: Cigarettes    Quit date: 07/20/2002    Years since quitting: 19.1   Smokeless tobacco: Never  Vaping Use   Vaping Use: Never used  Substance and Sexual Activity   Alcohol use: No   Drug use: No   Sexual activity: Not on file  Other Topics Concern   Not on file  Social History Narrative   Not on file   Social Determinants of Health   Financial Resource Strain: Low Risk  (05/16/2020)   Overall Financial Resource Strain (CARDIA)    Difficulty of Paying Living Expenses: Not hard at all  Food Insecurity: No Food Insecurity (05/16/2020)   Hunger Vital Sign    Worried About Running Out of Food in the Last Year: Never true    [place] in the Last Year: Never true  Transportation Needs: No Transportation Needs (05/16/2020)   PRAPARE - Hydrologist (Medical): No    Lack of Transportation (Non-Medical): No  Physical Activity: Insufficiently Active (05/16/2020)   Exercise Vital Sign    Days of Exercise per Week: 3 days    Minutes of Exercise per Session: 30 min  Stress: No Stress Concern Present (05/16/2020)   Purple [place]    Feeling of Stress : Not at all  Social Connections: Moderately Integrated (05/16/2020)   Social Connection and Isolation Panel [NHANES]    Frequency of Communication with Friends and Family: More than three times a week    Frequency of Social Gatherings with Friends and Family: More than three times a week    Attends Religious Services: More than 4 times per year    Active Member of Clubs or Organizations: No    Attends Archivist Meetings: 1 to 4 times per year    Marital Status: Widowed  Intimate Partner Violence: Not At Risk (05/16/2020)   Humiliation, Afraid, Rape, and Kick questionnaire    Fear of Current or Ex-Partner: No    Emotionally Abused: No    Physically Abused: No    Sexually Abused: No     FAMILY HISTORY:  Family History  Problem Relation Age of Onset   Cancer Neg Hx     CURRENT MEDICATIONS:  Current Outpatient Medications  Medication Sig Dispense Refill   acetaminophen (TYLENOL) 650 MG CR tablet Take 1,300 mg by mouth every 8 (eight) hours as needed for pain.     ascorbic acid (VITAMIN C) 250 MG tablet Take 250 mg by mouth 3 (three) times daily. Take with Iron (Ferrous Sulfate) to promote better absorption     atorvastatin (LIPITOR) 40 MG tablet TAKE ONE-HALF TABLET BY MOUTH AT BEDTIME FOR CHOLESTEROL     carvedilol (COREG) 3.125 MG tablet Take 1 tablet (3.125 mg total) by mouth 2 (two) times daily. 60 tablet 0   Cholecalciferol 25 MCG (1000 UT) tablet Take 1,000 Units by mouth daily.     clotrimazole-betamethasone (LOTRISONE) cream Apply 1 application. topically 2 (two) times  daily. 30 g 0   Cyanocobalamin 1000 MCG CAPS Take 1 tablet by mouth daily.     darolutamide (NUBEQA) 300 MG tablet Take 1 tablet (300 mg total) by mouth 2 (two) times daily with a meal. 60 tablet 0   diclofenac sodium (VOLTAREN) 1 % GEL Apply 2 g topically 4 (four) times daily. (Patient taking differently: Apply 2 g topically 4 (four) times daily as needed (pain).) 100 g 0   Ensure (ENSURE) Take 237 mLs by mouth daily as needed (nutrition). Vanilla or chocolate     ferrous sulfate 324 MG TBEC Take 324 mg by mouth 2 (two) times daily.     fluticasone (FLONASE) 50 MCG/ACT nasal spray Place 2 sprays into both nostrils daily. (Patient taking differently: Place 2 sprays into both nostrils daily as needed for allergies.) 16 g 2   furosemide (LASIX) 40 MG tablet Take 40 mg by mouth daily.     magnesium oxide (MAG-OX) 400 (240 Mg) MG tablet Take 1 tablet (400 mg total) by mouth 3 (three) times daily. 90 tablet 3   megestrol (MEGACE) 400 MG/10ML suspension Take 10 mLs (400 mg total) by mouth 2 (two) times daily. 480 mL 2   pantoprazole (PROTONIX) 40 MG tablet TAKE 1 TABLET BY MOUTH ONCE DAILY. (Patient  taking differently: Take 40 mg by mouth daily.) 30 tablet 0   potassium chloride SA (KLOR-CON) 20 MEQ tablet Take 20 mEq by mouth daily.     predniSONE (DELTASONE) 5 MG tablet Take 5 mg by mouth every morning. Continuous     rivaroxaban (XARELTO) 20 MG TABS tablet Take 1 tablet (20 mg total) by mouth daily with supper. 20 tablet 0   tamsulosin (FLOMAX) 0.4 MG CAPS capsule Take 1 capsule (0.4 mg total) by mouth daily after supper. 30 capsule 11   traMADol (ULTRAM) 50 MG tablet Take 1 tablet (50 mg total) by mouth every 6 (six) hours as needed. (Patient taking differently: Take 50 mg by mouth every 6 (six) hours as needed for severe pain.) 20 tablet 0   No current facility-administered medications for this visit.   Facility-Administered Medications Ordered in Other Visits  Medication Dose Route Frequency Provider Last Rate Last Admin   octreotide (SANDOSTATIN LAR) 20 MG IM injection             ALLERGIES:  No Known Allergies  PHYSICAL EXAM:  Performance status (ECOG): 1 - Symptomatic but completely ambulatory  There were no vitals filed for this visit. Wt Readings from Last 3 Encounters:  08/22/21 176 lb 2.4 oz (79.9 kg)  08/01/21 176 lb 2.4 oz (79.9 kg)  07/21/21 183 lb 3.2 oz (83.1 kg)   Physical Exam Vitals reviewed.  Constitutional:      Appearance: Normal appearance.     Comments: In wheelchair  Cardiovascular:     Rate and Rhythm: Normal rate and regular rhythm.     Pulses: Normal pulses.     Heart sounds: Normal heart sounds.  Pulmonary:     Effort: Pulmonary effort is normal.     Breath sounds: Normal breath sounds.  Musculoskeletal:     Right lower leg: No edema.     Left lower leg: No edema.  Neurological:     General: No focal deficit present.     Mental Status: He is alert and oriented to person, place, and time.  Psychiatric:        Mood and Affect: Mood normal.        Behavior: Behavior  normal.      LABORATORY DATA:  I have reviewed the labs as listed.      Latest Ref Rng & Units 08/26/2021    9:05 AM 08/03/2021    5:59 AM 08/02/2021    3:44 AM  CBC  WBC 4.0 - 10.5 K/uL 5.9  6.6  6.8   Hemoglobin 13.0 - 17.0 g/dL 11.3  11.8  12.0   Hematocrit 39.0 - 52.0 % 33.0  34.3  34.7   Platelets 150 - 400 K/uL 199  284  311       Latest Ref Rng & Units 08/03/2021    5:59 AM 08/02/2021    3:44 AM 08/01/2021    1:09 PM  CMP  Glucose 70 - 99 mg/dL 93  89  114   BUN 8 - 23 mg/dL 27  40  45   Creatinine 0.61 - 1.24 mg/dL 1.33  1.58  1.75   Sodium 135 - 145 mmol/L 133  131  127   Potassium 3.5 - 5.1 mmol/L 4.3  4.5  5.0   Chloride 98 - 111 mmol/L 108  101  96   CO2 22 - 32 mmol/L '20  22  23   '$ Calcium 8.9 - 10.3 mg/dL 8.8  8.8  9.3   Total Protein 6.5 - 8.1 g/dL  6.5  7.4   Total Bilirubin 0.3 - 1.2 mg/dL  1.0  0.7   Alkaline Phos 38 - 126 U/L  50  59   AST 15 - 41 U/L  21  25   ALT 0 - 44 U/L  24  28     DIAGNOSTIC IMAGING:  I have independently reviewed the scans and discussed with the patient. CT Head Wo Contrast  Result Date: 08/22/2021 CLINICAL DATA:  Head trauma EXAM: CT HEAD WITHOUT CONTRAST TECHNIQUE: Contiguous axial images were obtained from the base of the skull through the vertex without intravenous contrast. RADIATION DOSE REDUCTION: This exam was performed according to the departmental dose-optimization program which includes automated exposure control, adjustment of the mA and/or kV according to patient size and/or use of iterative reconstruction technique. COMPARISON:  04/15/2021 FINDINGS: Brain: There is no mass, hemorrhage or extra-axial collection. There is generalized atrophy without lobar predilection. Hypodensity of the white matter is most commonly associated with chronic microvascular disease. Vascular: Atherosclerotic calcification of the internal carotid arteries at the skull base. No abnormal hyperdensity of the major intracranial arteries or dural venous sinuses. Skull: The visualized skull base, calvarium and extracranial  soft tissues are normal. Sinuses/Orbits: No fluid levels or advanced mucosal thickening of the visualized paranasal sinuses. No mastoid or middle ear effusion. The orbits are normal. IMPRESSION: 1. No acute intracranial abnormality. 2. Chronic microvascular ischemia and generalized atrophy. Electronically Signed   By: Ulyses Jarred M.D.   On: 08/22/2021 22:02   DG Shoulder Right  Result Date: 08/22/2021 CLINICAL DATA:  Recent fall with right shoulder pain, initial encounter EXAM: RIGHT SHOULDER - 2+ VIEW COMPARISON:  05/24/2021 FINDINGS: Comminuted fracture of the distal right clavicle is noted. No acromioclavicular separation is seen. Mild degenerative changes of the glenohumeral joint are seen. Underlying bony thorax appears within normal limits. IMPRESSION: Fracture involving the distal right clavicle. Electronically Signed   By: Inez Catalina M.D.   On: 08/22/2021 21:41   CT ABDOMEN PELVIS W CONTRAST  Result Date: 08/01/2021 CLINICAL DATA:  Acute nonlocalized abdominal pain. Black stools and weakness. Abdominal pain for the last several days. EXAM: CT ABDOMEN AND PELVIS  WITH CONTRAST TECHNIQUE: Multidetector CT imaging of the abdomen and pelvis was performed using the standard protocol following bolus administration of intravenous contrast. RADIATION DOSE REDUCTION: This exam was performed according to the departmental dose-optimization program which includes automated exposure control, adjustment of the mA and/or kV according to patient size and/or use of iterative reconstruction technique. CONTRAST:  59m OMNIPAQUE IOHEXOL 300 MG/ML  SOLN COMPARISON:  CT abdomen pelvis without contrast 02/19/2021, CT abdomen and pelvis with contrast 10/25/2020 FINDINGS: Lower chest: Minimal curvilinear scarring versus subsegmental atelectasis within the inferior aspect of the middle lobe unchanged. Mild subsegmental atelectasis within the posterior left lower lobe. Heart size is mildly enlarged. Cardiac pacer wires are  noted. Unchanged 4 mm anterolateral right lower lobe nodule compared to 02/19/2021. Hepatobiliary: Smooth liver contours. No focal liver mass is identified. Status post cholecystectomy. Mild central intrahepatic biliary ductal dilatation appears unchanged likely postsurgical. The common bile duct measures up to approximately 7 mm, unchanged from prior and within normal limits given post cholecystectomy and patient age. Pancreas: No mass or inflammatory fat stranding. No pancreatic ductal dilatation is seen. Spleen: Normal in size without focal abnormality. Adrenals/Urinary Tract: Unchanged right adrenal nodule measuring up to approximately 11 x 19 mm (axial image 21). Normal left adrenal gland. There are numerous bilateral renal lesions. Some of these demonstrate fluid density suggesting cysts. There is an apparent partially enhancing mass extending exophytically from the lower pole of the right kidney measuring up to 2.3 x 1.9 by 2.7 cm (axial image 32 and coronal image 45) concerning for enhancing mass of renal cell carcinoma. This is a similar size 04/01/2021 noncontrast CT when this measured 17 Hounsfield units centrally. Within the far inferior aspect of the lower pole there is an exophytic 2.0 cm centrally low dense lesion and peripherally enhancing lesion that may represent a cyst versus neoplasm. This measures 33 Hounsfield units centrally. Within the upper pole of the left kidney there is a partially enhancing mass measuring 2.2 x 2.6 x 2.3 cm (axial image 18 and coronal image 46). This is also suspicious for a solid enhancing mass of renal cell carcinoma. This is not significant changed in size from 02/19/2021 noncontrast CT when the central aspect measured 17 Hounsfield units. No focal urinary bladder wall thickening. Stomach/Bowel: A diverticulum is seen within the sigmoid colon without inflammatory change. Moderate to high-grade stool is seen throughout the colon. Surgical suture is seen in the region  of the proximal ascending colon, an ileocolic anastomosis. No dilated loops of bowel to indicate bowel obstruction. Vascular/Lymphatic: No abdominal aortic aneurysm. Moderate to high-grade atherosclerotic calcifications. The right common iliac artery is distended up to 2.0 cm in the left common iliac artery is distended up to 1.8 cm. No mesenteric, retroperitoneal, or pelvic lymphadenopathy. Reproductive: The prostate gland is again enlarged and contains brachytherapy seeds. The seminal vesicles are grossly unremarkable. Other: Small fat containing umbilical hernia.No abdominopelvic ascites. No pneumoperitoneum. Musculoskeletal: Mild levocurvature of the mid to upper lumbar spine. Severe right L2-3 disc space narrowing and endplate degenerative changes. IMPRESSION: Compared to 02/19/2021: 1. No CT explanation for the patient's abdominal pain. 2. Bilateral enhancing renal masses again suspicious for renal cell carcinoma. 3. Postsurgical changes of prior right hemicolectomy and ileocolic anastomosis. No bowel obstruction. 4. Additional chronic findings as above. Electronically Signed   By: RYvonne KendallM.D.   On: 08/01/2021 18:54   XR Shoulder Left  Result Date: 08/01/2021 AP, scapular Y, axillary views of left shoulder reviewed.  Minimal degenerative changes  of the acromioclavicular and glenohumeral joints.  No fracture or dislocation noted.  No significant loss of acromiohumeral interval.    ASSESSMENT:  1.  Metastatic prostate cancer to the right adrenal gland: -Presentation to the ER with worsening right-sided back pain, which has been on and off for the last couple of years. - Some decrease in appetite with 6 pound weight loss in the last 6 months.  No hematuria or other B symptoms. - CT renal study on 05/03/2020 showed new right adrenal mass with bilateral renal lesions.  Indeterminate L2 vertebral bodies sclerotic lesion.  Low-attenuation lesions in the kidneys measure up to 2.3 cm on the right and  were similar to scan from July 2021. - CT CAP on 06/12/2020 did not show any evidence of nodal metastasis or metastatic disease in the chest.  Right adrenal mass measures 4.2 x 3.8 cm exerts mass-effect upon the posterior and right lateral wall of the IVC.  Right kidney masses measuring 2.3 x 2.3 cm and 1.9 x 1.7 cm suspicious for RCC.  Upper pole of the left kidney lesion 2.2 x 2.0 cm and medial cortex of the inferior pole measuring 2 x 2 cm and another interpolar left kidney lesion measuring 1.3 x 1.5 cm. - Bone scan on 06/13/2020 was negative. - Right adrenal biopsy consistent with prostatic adenocarcinoma - Degarelix on 07/16/2020, transition to Eligard 45 mg on 08/21/2020. - Abiraterone 100 mg daily/prednisone 5 mg daily started around 01/22/2021.,  Discontinued in April 2023.  Not a candidate for Erleada due to blood thinner. - Darolutamide 300 mg twice daily started on 06/19/2021, increased to 600 mg twice daily on 06/26/2021, cut back to 300 mg twice daily on 07/07/2021 due to decreased oral intake and tiredness.   2.  Social/family history: - He lives at home with his daughter.  He walks with a cane. - He mows lawn with a riding mower.  He has a garden and gross vegetables.  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  Quit smoking 25 years ago. - No family history of malignancies that he is aware of.   PLAN:  .  Metastatic prostate cancer to the right adrenal gland: - He is tolerating darolutamide 300 mg twice daily very well. - He went to the ER on 08/22/2021 after clavicle fracture after a fall. - He was also admitted with GI bleed from 08/01/2021 through 08/03/2021.  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  Hemoglobin remained stable. - Reviewed labs today with hemoglobin 11.3.  LFTs are normal.  Last PSA improved to 1.73. - Continue darolutamide same dose of 300 mg twice daily.  RTC 6 weeks for follow-up.  I plan to do a DEXA scan to evaluate bone  density.  We will also check vitamin D and PSA.    2.  Right sided back pain: - Continue tramadol as needed for back pain.  3.  Hypomagnesemia: - Continue magnesium 3 times daily.  Magnesium is normal.  4.  Decreased appetite: - Continue Megace twice daily.  He is continuing to improve weight.   Orders placed this encounter:  No orders of the defined types were placed in this encounter.    Derek Jack, MD [place] [phone]   I, Thana Ates, am acting as a scribe for Dr. Derek Jack.  I, Derek Jack MD, have reviewed the above documentation for accuracy and completeness, and I agree with the above.

## 2021-08-26 NOTE — Progress Notes (Signed)
Patient tolerated injection with no complaints voiced.  Site clean and dry with no bruising or swelling noted at site.  See MAR for details.  Band aid applied.  Patient stable during and after injection.  Vss with discharge and left in satisfactory condition with no s/s of distress noted.  

## 2021-08-26 NOTE — Progress Notes (Signed)
Patient is taking Nubeqa as prescribed.  He has not missed any doses and reports no side effects at this time.   

## 2021-08-27 ENCOUNTER — Ambulatory Visit: Payer: Medicare Other | Admitting: Orthopedic Surgery

## 2021-08-27 DIAGNOSIS — S42031A Displaced fracture of lateral end of right clavicle, initial encounter for closed fracture: Secondary | ICD-10-CM

## 2021-08-28 ENCOUNTER — Telehealth: Payer: Self-pay

## 2021-08-28 ENCOUNTER — Encounter (HOSPITAL_COMMUNITY): Payer: Self-pay | Admitting: Hematology

## 2021-08-28 ENCOUNTER — Other Ambulatory Visit (HOSPITAL_COMMUNITY): Payer: Self-pay

## 2021-08-28 ENCOUNTER — Encounter: Payer: Self-pay | Admitting: Orthopedic Surgery

## 2021-08-28 NOTE — Progress Notes (Signed)
Office Visit Note   Patient: Derrick Long           Date of Birth: 04/30/1926           MRN: [id number] Visit Date: 08/27/2021 Requested by: Center, [facility] [address] [place],   [address] PCP: [place]  Subjective: Chief Complaint  Patient presents with   Right Shoulder - Injury    HPI: Derrick Long is a 88 year old patient with right shoulder pain.  Had a fall on 08/22/2021.  Was advised in the emergency department that he had a clavicle fracture.  He has been in a sling.  He is right-hand dominant.  Does describe a lot of pain with activity.  Taking Ultram as needed.              ROS: All systems reviewed are negative as they relate to the chief complaint within the history of present illness.  Patient denies  fevers or chills.   Assessment & Plan: Visit Diagnoses:  1. Displaced fracture of lateral end of right clavicle, initial encounter for closed fracture     Plan: Impression is mildly displaced fracture of the lateral end of the right clavicle.  Plan is sling immobilization for about 2-3 more weeks.  Return to clinic at that time for repeat radiographs and clinical examination.  Anticipate we could DC the sling at that time but not let him do any loadbearing or weightbearing through the right shoulder for an additional 2 weeks thereafter.  Follow-Up Instructions: Return in about 3 weeks (around 09/17/2021).   Orders:  No orders of the defined types were placed in this encounter.  No orders of the defined types were placed in this encounter.     Procedures: No procedures performed   Clinical Data: No additional findings.  Objective: Vital Signs: There were no vitals taken for this visit.  Physical Exam:   Constitutional: Patient appears well-developed HEENT:  Head: Normocephalic Eyes:EOM are normal Neck: Normal range of motion Cardiovascular: Normal rate Pulmonary/chest: Effort normal Neurologic: Patient is alert Skin: Skin is  warm Psychiatric: Patient has normal mood and affect   Ortho Exam: Ortho exam demonstrates some tenderness to palpation and swelling around the distal end of the right lateral clavicle.  Motor or sensory function of the hand is intact.  Elbow range of motion intact.  Deltoid is functional.  Radial pulse intact.  Grip EPL FPL interosseous strength intact on the right-hand side.  No groin pain with internal/external rotation of either leg.  Specialty Comments:  No specialty comments available.  Imaging: No results found.   PMFS History: Patient Active Problem List   Diagnosis Date Noted   GI bleed 08/01/2021   Hyponatremia 08/01/2021   Macrocytic anemia 08/01/2021   Mixed hyperlipidemia 08/01/2021   Acute kidney injury superimposed on chronic kidney disease ([place]) 03/27/2021   UTI (urinary tract infection) 03/26/2021   DNR (do not resuscitate) 03/26/2021   CKD (chronic kidney disease), stage III ([place]) 03/26/2021   Elevated bilirubin    Elevated LFTs    RUQ pain    Intractable abdominal pain 02/03/2021   Prostate cancer ([place]) 07/10/2020   Bilateral kidney masses 06/06/2020   Adrenal mass, right ([place]) 06/06/2020   Acute colitis 09/16/2019   Abdominal pain 09/16/2019   Nausea 09/16/2019   Hypoalbuminemia 09/16/2019   Pelvic lymphadenopathy 09/16/2019   History of atrial fibrillation 09/16/2019   Colitis 09/16/2019   Paroxysmal atrial fibrillation ([place]) 09/02/2012   HTN (hypertension) 09/02/2012  Pacemaker 08/03/2012   Chest pain 07/31/2012   Leukocytosis, unspecified 07/31/2012   AKI (acute kidney injury) ([place]) 07/31/2012   Gout 07/31/2012   First degree AV block, PR interval 370 ms 07/19/2012   Chest pain, atypical 07/19/2012   Acute renal insufficiency 07/19/2012   Hypokalemia 07/19/2012   Syncope 07/19/2012   Bradycardia 07/19/2012   LUMBAR STRAIN 09/10/2008   ANEMIA, IRON DEFICIENCY NOS 08/16/2008   OSTEOARTHRITIS, MODERATE 09/21/2007   INSOMNIA 06/24/2007    Dyslipidemia 04/05/2007   DENTAL CARIES 10/08/2006   LATERAL MENISCUS TEAR, RIGHT 04/19/2006   MIGRAINE HEADACHE 12/18/2005   Essential hypertension 12/18/2005   GERD 12/18/2005   RENAL CALCULUS 12/18/2005   BENIGN PROSTATIC HYPERTROPHY, WITH OBSTRUCTION 12/18/2005   ARTHRITIS 12/18/2005   LOW BACK PAIN 12/18/2005   OSTEOPENIA 12/18/2005   COLON CANCER, HX OF 12/18/2005   PROSTATE CANCER, HX OF 12/18/2005   Past Medical History:  Diagnosis Date   Acid reflux    Anemia    Colon cancer ([place])    per patient, colon cancer more than 10 years ago s/p right hemicolectomy   Ex-cigarette smoker    Gout    Hyperlipemia    Hypertension    Paroxysmal atrial fibrillation ([place]) 2014   Prostate cancer ([place]) 08/08/2013   SEEDS 20 YEARS AGO   Prostate disease     Family History  Problem Relation Age of Onset   Cancer Neg Hx     Past Surgical History:  Procedure Laterality Date   BIOPSY  02/05/2021   Procedure: BIOPSY;  Surgeon: Harvel Quale, MD;  [facility];  Service: Gastroenterology;;   BIOPSY  08/02/2021   Procedure: BIOPSY;  Surgeon: Daneil Dolin, MD;  [facility];  Service: Endoscopy;;   COLON SURGERY     Right hemicolectomy in the setting of colon cancer, per patient   CYSTOSCOPY WITH INSERTION OF UROLIFT N/A 05/19/2021   Procedure: CYSTOSCOPY WITH INSERTION OF UROLIFT;  Surgeon: Cleon Gustin, MD;  Location: [facility] ORS;  Service: Urology;  Laterality: N/A;   ESOPHAGOGASTRODUODENOSCOPY (EGD) WITH PROPOFOL N/A 02/05/2021   Procedure: ESOPHAGOGASTRODUODENOSCOPY (EGD) WITH PROPOFOL;  Surgeon: Harvel Quale, MD;  [facility];  Service: Gastroenterology;  Laterality: N/A;   ESOPHAGOGASTRODUODENOSCOPY (EGD) WITH PROPOFOL N/A 08/02/2021   Procedure: ESOPHAGOGASTRODUODENOSCOPY (EGD) WITH PROPOFOL;  Surgeon: Daneil Dolin, MD;  [facility];  Service: Endoscopy;  Laterality: N/A;   HOT HEMOSTASIS  08/02/2021    Procedure: HOT HEMOSTASIS (ARGON PLASMA COAGULATION/BICAP);  Surgeon: Daneil Dolin, MD;  [facility];  Service: Endoscopy;;   LEFT HEART CATHETERIZATION WITH CORONARY ANGIOGRAM N/A 07/21/2012   Procedure: LEFT HEART CATHETERIZATION WITH CORONARY ANGIOGRAM;  Surgeon: Troy Sine, MD;  Location: [facility] CATH LAB;  Service: Cardiovascular;  Laterality: N/A;   PACEMAKER INSERTION     PERMANENT PACEMAKER INSERTION N/A 07/22/2012   Procedure: PERMANENT PACEMAKER INSERTION;  Surgeon: Sanda Klein, MD;  Location: [facility] CATH LAB;  Service: Cardiovascular;  Laterality: N/A;   PROSTATE SURGERY     Social History   Occupational History   Not on file  Tobacco Use   Smoking status: Former    Types: Cigarettes    Quit date: 07/20/2002    Years since quitting: 19.1   Smokeless tobacco: Never  Vaping Use   Vaping Use: Never used  Substance and Sexual Activity   Alcohol use: No   Drug use: No   Sexual activity: Not on file

## 2021-08-28 NOTE — Telephone Encounter (Signed)
Oral Oncology Patient Advocate Encounter   Was successful in securing patient a $7500 grant from [employer] to provide copayment coverage for [place].  This will keep the out of pocket expense at $0.    The billing information is as follows and has been shared with [place].   METASTATIC PROSTATE CANCER Member ID: [id number] Group ID: [facility] RxBin: [id number] PCN: [id number] Dates of Eligibility: 08/28/21 through 08/29/22  Clista Bernhardt, CPhT Pharmacy Patient Advocate Specialist [place] Patient Advocate Team Direct Number: [phone]   Fax: [phone]

## 2021-09-09 ENCOUNTER — Other Ambulatory Visit (HOSPITAL_COMMUNITY): Payer: Self-pay

## 2021-09-09 ENCOUNTER — Other Ambulatory Visit (HOSPITAL_COMMUNITY): Payer: Self-pay | Admitting: Hematology

## 2021-09-09 DIAGNOSIS — C61 Malignant neoplasm of prostate: Secondary | ICD-10-CM

## 2021-09-09 MED ORDER — NUBEQA 300 MG PO TABS
300.0000 mg | ORAL_TABLET | Freq: Two times a day (BID) | ORAL | 0 refills | Status: DC
  Filled 2021-09-09: qty 60, 30d supply, fill #0

## 2021-09-10 ENCOUNTER — Other Ambulatory Visit (HOSPITAL_COMMUNITY): Payer: Self-pay

## 2021-09-11 ENCOUNTER — Other Ambulatory Visit (HOSPITAL_COMMUNITY): Payer: Self-pay

## 2021-09-12 ENCOUNTER — Other Ambulatory Visit (HOSPITAL_COMMUNITY): Payer: Self-pay

## 2021-09-16 ENCOUNTER — Other Ambulatory Visit (HOSPITAL_COMMUNITY): Payer: Self-pay

## 2021-09-17 ENCOUNTER — Encounter: Payer: Self-pay | Admitting: Orthopedic Surgery

## 2021-09-17 ENCOUNTER — Ambulatory Visit: Payer: Medicare Other | Admitting: Surgical

## 2021-09-17 ENCOUNTER — Ambulatory Visit (INDEPENDENT_AMBULATORY_CARE_PROVIDER_SITE_OTHER): Payer: No Typology Code available for payment source

## 2021-09-17 DIAGNOSIS — S42031A Displaced fracture of lateral end of right clavicle, initial encounter for closed fracture: Secondary | ICD-10-CM | POA: Diagnosis not present

## 2021-09-17 MED ORDER — TRAMADOL HCL 50 MG PO TABS: 50.0000 mg | ORAL_TABLET | Freq: Two times a day (BID) | ORAL | 0 refills | Status: DC | PRN

## 2021-09-17 NOTE — Progress Notes (Signed)
Post-fracture Visit Note   Patient: Derrick Long           Date of Birth: [birth year]           MRN: [id number] Visit Date: 09/17/2021 PCP: [place]:  Chief Complaint:  Chief Complaint  Patient presents with   Other    F/u clavicle fx   Visit Diagnoses:  1. Displaced fracture of lateral end of right clavicle, initial encounter for closed fracture     Plan: Patient is a 88 year old male who returns for reevaluation of right clavicle fracture sustained on 08/22/2021.  He states he is in sling and feels a lot better overall.  Not really having any pain in the right shoulder.  He is able to sleep on his right side.  Compliant with using sling.  Takes it off at night and for showering.  On exam, he has really no tenderness over the fracture site and no significant mobility of the fracture site.  Right shoulder radiographs taken today show callus formation surrounding the clavicle fracture.  Plan is continue with sling immobilization when he is ambulatory or out of his home but otherwise he may take it off when he is just sedentary and when he is showering/sleeping.  Completely discontinue sling in 2 weeks.  No heavy lifting with the arm.  Follow-up in 4 weeks for final check and consideration of left shoulder glenohumeral injection at that time.  He had previous subacromial injection but this has not provided much relief for his left shoulder like it did for his right.  Refilled tramadol.  Follow-up in 4 weeks.  Follow-Up Instructions: No follow-ups on file.   Orders:  Orders Placed This Encounter  Procedures   XR Clavicle Right   No orders of the defined types were placed in this encounter.   Imaging: No results found.  PMFS History: Patient Active Problem List   Diagnosis Date Noted   GI bleed 08/01/2021   Hyponatremia 08/01/2021   Macrocytic anemia 08/01/2021   Mixed hyperlipidemia 08/01/2021   Acute kidney injury superimposed on  chronic kidney disease ([place]) 03/27/2021   UTI (urinary tract infection) 03/26/2021   DNR (do not resuscitate) 03/26/2021   CKD (chronic kidney disease), stage III ([place]) 03/26/2021   Elevated bilirubin    Elevated LFTs    RUQ pain    Intractable abdominal pain 02/03/2021   Prostate cancer ([place]) 07/10/2020   Bilateral kidney masses 06/06/2020   Adrenal mass, right ([place]) 06/06/2020   Acute colitis 09/16/2019   Abdominal pain 09/16/2019   Nausea 09/16/2019   Hypoalbuminemia 09/16/2019   Pelvic lymphadenopathy 09/16/2019   History of atrial fibrillation 09/16/2019   Colitis 09/16/2019   Paroxysmal atrial fibrillation ([place]) 09/02/2012   HTN (hypertension) 09/02/2012   Pacemaker 08/03/2012   Chest pain 07/31/2012   Leukocytosis, unspecified 07/31/2012   AKI (acute kidney injury) ([place]) 07/31/2012   Gout 07/31/2012   First degree AV block, PR interval 370 ms 07/19/2012   Chest pain, atypical 07/19/2012   Acute renal insufficiency 07/19/2012   Hypokalemia 07/19/2012   Syncope 07/19/2012   Bradycardia 07/19/2012   LUMBAR STRAIN 09/10/2008   ANEMIA, IRON DEFICIENCY NOS 08/16/2008   OSTEOARTHRITIS, MODERATE 09/21/2007   INSOMNIA 06/24/2007   Dyslipidemia 04/05/2007   DENTAL CARIES 10/08/2006   LATERAL MENISCUS TEAR, RIGHT 04/19/2006   MIGRAINE HEADACHE 12/18/2005   Essential hypertension 12/18/2005   GERD 12/18/2005   RENAL CALCULUS 12/18/2005   BENIGN PROSTATIC  HYPERTROPHY, WITH OBSTRUCTION 12/18/2005   ARTHRITIS 12/18/2005   LOW BACK PAIN 12/18/2005   OSTEOPENIA 12/18/2005   COLON CANCER, HX OF 12/18/2005   PROSTATE CANCER, HX OF 12/18/2005   Past Medical History:  Diagnosis Date   Acid reflux    Anemia    Colon cancer ([place])    per patient, colon cancer more than 10 years ago s/p right hemicolectomy   Ex-cigarette smoker    Gout    Hyperlipemia    Hypertension    Paroxysmal atrial fibrillation ([place]) 2014   Prostate cancer ([place]) 08/08/2013   SEEDS 20 YEARS AGO    Prostate disease     Family History  Problem Relation Age of Onset   Cancer Neg Hx     Past Surgical History:  Procedure Laterality Date   BIOPSY  02/05/2021   Procedure: BIOPSY;  Surgeon: Harvel Quale, MD;  [facility];  Service: Gastroenterology;;   BIOPSY  08/02/2021   Procedure: BIOPSY;  Surgeon: Daneil Dolin, MD;  [facility];  Service: Endoscopy;;   COLON SURGERY     Right hemicolectomy in the setting of colon cancer, per patient   CYSTOSCOPY WITH INSERTION OF UROLIFT N/A 05/19/2021   Procedure: CYSTOSCOPY WITH INSERTION OF UROLIFT;  Surgeon: Cleon Gustin, MD;  Location: [facility] ORS;  Service: Urology;  Laterality: N/A;   ESOPHAGOGASTRODUODENOSCOPY (EGD) WITH PROPOFOL N/A 02/05/2021   Procedure: ESOPHAGOGASTRODUODENOSCOPY (EGD) WITH PROPOFOL;  Surgeon: Harvel Quale, MD;  [facility];  Service: Gastroenterology;  Laterality: N/A;   ESOPHAGOGASTRODUODENOSCOPY (EGD) WITH PROPOFOL N/A 08/02/2021   Procedure: ESOPHAGOGASTRODUODENOSCOPY (EGD) WITH PROPOFOL;  Surgeon: Daneil Dolin, MD;  [facility];  Service: Endoscopy;  Laterality: N/A;   HOT HEMOSTASIS  08/02/2021   Procedure: HOT HEMOSTASIS (ARGON PLASMA COAGULATION/BICAP);  Surgeon: Daneil Dolin, MD;  [facility];  Service: Endoscopy;;   LEFT HEART CATHETERIZATION WITH CORONARY ANGIOGRAM N/A 07/21/2012   Procedure: LEFT HEART CATHETERIZATION WITH CORONARY ANGIOGRAM;  Surgeon: Troy Sine, MD;  Location: [facility] CATH LAB;  Service: Cardiovascular;  Laterality: N/A;   PACEMAKER INSERTION     PERMANENT PACEMAKER INSERTION N/A 07/22/2012   Procedure: PERMANENT PACEMAKER INSERTION;  Surgeon: Sanda Klein, MD;  Location: [facility] CATH LAB;  Service: Cardiovascular;  Laterality: N/A;   PROSTATE SURGERY     Social History   Occupational History   Not on file  Tobacco Use   Smoking status: Former    Types: Cigarettes    Quit date: 07/20/2002    Years  since quitting: 19.1   Smokeless tobacco: Never  Vaping Use   Vaping Use: Never used  Substance and Sexual Activity   Alcohol use: No   Drug use: No   Sexual activity: Not on file

## 2021-09-29 ENCOUNTER — Encounter (HOSPITAL_COMMUNITY): Payer: Self-pay | Admitting: Emergency Medicine

## 2021-09-29 ENCOUNTER — Other Ambulatory Visit: Payer: Self-pay

## 2021-09-29 ENCOUNTER — Emergency Department (HOSPITAL_COMMUNITY)
Admission: EM | Admit: 2021-09-29 | Discharge: 2021-09-29 | Disposition: A | Discharge status: Home / Self Care | Payer: Medicare Other | Attending: Emergency Medicine | Admitting: Emergency Medicine

## 2021-09-29 DIAGNOSIS — I10 Essential (primary) hypertension: Secondary | ICD-10-CM | POA: Diagnosis not present

## 2021-09-29 DIAGNOSIS — K921 Melena: Secondary | ICD-10-CM | POA: Diagnosis present

## 2021-09-29 DIAGNOSIS — R195 Other fecal abnormalities: Secondary | ICD-10-CM

## 2021-09-29 DIAGNOSIS — Z85038 Personal history of other malignant neoplasm of large intestine: Secondary | ICD-10-CM | POA: Insufficient documentation

## 2021-09-29 DIAGNOSIS — Z8546 Personal history of malignant neoplasm of prostate: Secondary | ICD-10-CM | POA: Insufficient documentation

## 2021-09-29 DIAGNOSIS — Z7901 Long term (current) use of anticoagulants: Secondary | ICD-10-CM | POA: Diagnosis not present

## 2021-09-29 DIAGNOSIS — Z79899 Other long term (current) drug therapy: Secondary | ICD-10-CM | POA: Insufficient documentation

## 2021-09-29 LAB — CBC
HCT: 32.8 % — ABNORMAL LOW (ref 39.0–52.0)
Hemoglobin: 11.4 g/dL — ABNORMAL LOW (ref 13.0–17.0)
MCH: 34.9 pg — ABNORMAL HIGH (ref 26.0–34.0)
MCHC: 34.8 g/dL (ref 30.0–36.0)
MCV: 100.3 fL — ABNORMAL HIGH (ref 80.0–100.0)
Platelets: 273 10*3/uL (ref 150–400)
RBC: 3.27 MIL/uL — ABNORMAL LOW (ref 4.22–5.81)
RDW: 12.9 % (ref 11.5–15.5)
WBC: 5.4 10*3/uL (ref 4.0–10.5)
nRBC: 0 % (ref 0.0–0.2)

## 2021-09-29 LAB — COMPREHENSIVE METABOLIC PANEL
ALT: 19 U/L (ref 0–44)
AST: 19 U/L (ref 15–41)
Albumin: 3.5 g/dL (ref 3.5–5.0)
Alkaline Phosphatase: 66 U/L (ref 38–126)
Anion gap: 7 (ref 5–15)
BUN: 24 mg/dL — ABNORMAL HIGH (ref 8–23)
CO2: 20 mmol/L — ABNORMAL LOW (ref 22–32)
Calcium: 9 mg/dL (ref 8.9–10.3)
Chloride: 106 mmol/L (ref 98–111)
Creatinine, Ser: 1.48 mg/dL — ABNORMAL HIGH (ref 0.61–1.24)
GFR, Estimated: 44 mL/min — ABNORMAL LOW (ref >60)
Glucose, Bld: 120 mg/dL — ABNORMAL HIGH (ref 70–99)
Potassium: 4.2 mmol/L (ref 3.5–5.1)
Sodium: 133 mmol/L — ABNORMAL LOW (ref 135–145)
Total Bilirubin: 0.8 mg/dL (ref 0.3–1.2)
Total Protein: 6.8 g/dL (ref 6.5–8.1)

## 2021-09-29 LAB — TYPE AND SCREEN
ABO/RH(D): O POS
Antibody Screen: NEGATIVE

## 2021-09-29 LAB — POC OCCULT BLOOD, ED: Fecal Occult Bld: NEGATIVE

## 2021-09-29 NOTE — Discharge Instructions (Signed)
Please follow-up with your outpatient doctors for ongoing management of your medical conditions.  Return to the emergency department at any time for any new or worsening symptoms of concern.

## 2021-09-29 NOTE — ED Triage Notes (Signed)
Pt presents with dark tarry stools, x 1 week.

## 2021-09-29 NOTE — ED Provider Notes (Signed)
[facility] EMERGENCY DEPARTMENT Provider Note   CSN: [id number] Arrival date & time: 09/29/21  1135     History  Chief Complaint  Patient presents with   Melena    Ibn L Claytor is a 88 y.o. male.  HPI Patient presents for dark stools.  Medical history includes prostate cancer, atrial fibrillation, colon cancer, gout, HLD, HTN, anemia, migraines, osteopenia, gout, and prior GI bleeding.  He is prescribed Xarelto.  He states that he has been undergoing recent medication changes through his [place] doctors.  He is unsure of which medications he currently takes.  Patient lives at home with his daughter.  He receives his outpatient care through the [place].  He states that he does have active prostate cancer and is currently on chemotherapy.  Patient reports that he has had dark firm stools for several weeks.  He reports intermittent lightheadedness with standing.  He denies any other symptoms or concerns.  History per daughter: Patient had multiple episodes of loose dark stool 2 days ago.  Due to his history of GI bleeding in the past, patient presents to the ED today.  Patient is on an iron supplement.    Home Medications Prior to Admission medications   Medication Sig Start Date End Date Taking? Authorizing Provider  acetaminophen (TYLENOL) 650 MG CR tablet Take 1,300 mg by mouth every 8 (eight) hours as needed for pain.    [provider]  ascorbic acid (VITAMIN C) 250 MG tablet Take 250 mg by mouth 3 (three) times daily. Take with Iron (Ferrous Sulfate) to promote better absorption 03/18/21   [provider]  atorvastatin (LIPITOR) 40 MG tablet TAKE ONE-HALF TABLET BY MOUTH AT BEDTIME FOR CHOLESTEROL 08/13/21   [provider]  carvedilol (COREG) 3.125 MG tablet Take 1 tablet (3.125 mg total) by mouth 2 (two) times daily. 03/28/21 05/15/23  Antonieta Pert, MD  Cholecalciferol 25 MCG (1000 UT) tablet Take 1,000 Units by mouth daily.    [provider]   clotrimazole-betamethasone (LOTRISONE) cream Apply 1 application. topically 2 (two) times daily. 05/21/21   McKenzie, Candee Furbish, MD  Cyanocobalamin 1000 MCG CAPS Take 1 tablet by mouth daily. 11/03/13   [provider]  darolutamide (NUBEQA) 300 MG tablet Take 1 tablet (300 mg total) by mouth 2 (two) times daily with a meal. 09/09/21   Derek Jack, MD  diclofenac sodium (VOLTAREN) 1 % GEL Apply 2 g topically 4 (four) times daily. 06/19/17   Rancour, Annie Main, MD  Ensure (ENSURE) Take 237 mLs by mouth daily as needed (nutrition). Vanilla or chocolate    [provider]  ferrous sulfate 324 MG TBEC Take 324 mg by mouth 2 (two) times daily.    [provider]  fluticasone (FLONASE) 50 MCG/ACT nasal spray Place 2 sprays into both nostrils daily. Patient taking differently: Place 2 sprays into both nostrils daily as needed for allergies. 02/15/16   Ward, Delice Bison, DO  furosemide (LASIX) 40 MG tablet Take 40 mg by mouth daily. 03/18/21   [provider]  magnesium oxide (MAG-OX) 400 (240 Mg) MG tablet Take 1 tablet (400 mg total) by mouth 3 (three) times daily. 06/02/21   Derek Jack, MD  megestrol (MEGACE) 400 MG/10ML suspension Take 10 mLs (400 mg total) by mouth 2 (two) times daily. 03/20/21   Derek Jack, MD  pantoprazole (PROTONIX) 40 MG tablet TAKE 1 TABLET BY MOUTH ONCE DAILY. Patient taking differently: Take 40 mg by mouth daily. 07/22/21   Derek Jack,  MD  potassium chloride SA (KLOR-CON) 20 MEQ tablet Take 20 mEq by mouth daily. 02/28/20   [provider]  predniSONE (DELTASONE) 5 MG tablet Take 1 tablet (5 mg total) by mouth every morning. Continuous 08/26/21   Derek Jack, MD  rivaroxaban (XARELTO) 20 MG TABS tablet Take 1 tablet (20 mg total) by mouth daily with supper. 08/06/21   Manuella Ghazi, Pratik D, DO  tamsulosin (FLOMAX) 0.4 MG CAPS capsule Take 1 capsule (0.4 mg total) by mouth daily after supper. 05/07/21   McKenzie,  Candee Furbish, MD  traMADol (ULTRAM) 50 MG tablet Take 1 tablet (50 mg total) by mouth every 12 (twelve) hours as needed. 09/17/21   Magnant, Gerrianne Scale, PA-C      Allergies    Patient has no known allergies.    Review of Systems   Review of Systems  Gastrointestinal:        Dark stools  Neurological:  Positive for light-headedness (Intermittent with standing).  All other systems reviewed and are negative.   Physical Exam Updated Vital Signs BP (!) 154/70   Pulse 70   Temp 97.9 F (36.6 C) (Oral)   Resp 20   Ht '6\' 1"'$  (1.854 m)   Wt 82.6 kg   SpO2 100%   BMI 24.01 kg/m  Physical Exam Vitals and nursing note reviewed.  Constitutional:      General: He is not in acute distress.    Appearance: Normal appearance. He is well-developed. He is not ill-appearing, toxic-appearing or diaphoretic.  HENT:     Head: Normocephalic and atraumatic.     Right Ear: External ear normal.     Left Ear: External ear normal.     Nose: Nose normal.     Mouth/Throat:     Mouth: Mucous membranes are moist.  Eyes:     General: No scleral icterus.    Extraocular Movements: Extraocular movements intact.     Conjunctiva/sclera: Conjunctivae normal.  Cardiovascular:     Rate and Rhythm: Normal rate.     Heart sounds: No murmur heard. Pulmonary:     Effort: Pulmonary effort is normal. No respiratory distress.     Breath sounds: Normal breath sounds. No wheezing or rales.  Abdominal:     General: There is no distension.     Palpations: Abdomen is soft.     Tenderness: There is no abdominal tenderness.  Musculoskeletal:        General: No swelling. Normal range of motion.     Cervical back: Normal range of motion and neck supple.     Right lower leg: No edema.     Left lower leg: No edema.  Skin:    General: Skin is warm and dry.     Capillary Refill: Capillary refill takes less than 2 seconds.     Coloration: Skin is not jaundiced or pale.  Neurological:     General: No focal deficit present.      Mental Status: He is alert and oriented to person, place, and time.     Cranial Nerves: No cranial nerve deficit.     Sensory: No sensory deficit.     Motor: No weakness.     Coordination: Coordination normal.  Psychiatric:        Mood and Affect: Mood normal.        Behavior: Behavior normal.        Thought Content: Thought content normal.        Judgment: Judgment normal.  ED Results / Procedures / Treatments   Labs (all labs ordered are listed, but only abnormal results are displayed) Labs Reviewed  COMPREHENSIVE METABOLIC PANEL - Abnormal; Notable for the following components:      Result Value   Sodium 133 (*)    CO2 20 (*)    Glucose, Bld 120 (*)    BUN 24 (*)    Creatinine, Ser 1.48 (*)    GFR, Estimated 44 (*)    All other components within normal limits  CBC - Abnormal; Notable for the following components:   RBC 3.27 (*)    Hemoglobin 11.4 (*)    HCT 32.8 (*)    MCV 100.3 (*)    MCH 34.9 (*)    All other components within normal limits  POC OCCULT BLOOD, ED  TYPE AND SCREEN    EKG EKG Interpretation  Date/Time:  Monday September 29 2021 15:24:22 EDT Ventricular Rate:  66 PR Interval:  283 QRS Duration: 93 QT Interval:  393 QTC Calculation: 412 R Axis:   -10 Text Interpretation: Sinus rhythm Prolonged PR interval Anteroseptal infarct, old Borderline repolarization abnormality Baseline wander in lead(s) II aVR aVF No significant change since last tracing Confirmed by Godfrey Pick [phone]) on 09/29/2021 4:17:34 PM  Radiology No results found.  Procedures Procedures    Medications Ordered in ED Medications - No data to display  ED Course/ Medical Decision Making/ A&P                           Medical Decision Making Amount and/or Complexity of Data Reviewed Labs: ordered.   This patient presents to the ED for concern of dark stools, this involves an extensive number of treatment options, and is a complaint that carries with it a high risk of  complications and morbidity.  The differential diagnosis includes oral iron supplement, upper GI bleed, epistaxis, lower GI bleed with slow transit   Co morbidities that complicate the patient evaluation  prostate cancer, atrial fibrillation, colon cancer, gout, HLD, HTN, anemia, migraines, osteopenia, gout, and prior GI bleeding   Additional history obtained:  Additional history obtained from patient's daughter External records from outside source obtained and reviewed including EMR   Lab Tests:  I Ordered, and personally interpreted labs.  The pertinent results include: Baseline hemoglobin, normal platelets, baseline CKD, normal electrolytes, Hemoccult negative   Problem List / ED Course / Critical interventions / Medication management  Patient is a pleasant 88 year old male presenting for concerns of multiple episodes of dark stools 2 days ago.  He reports that he has had ongoing dark stools for the past several weeks.  He is prescribed Xarelto but is unsure if he takes this medication currently.  Patient's daughter also reports that he is prescribed an iron supplement.  On his arrival in the ED, patient is well-appearing.  He denies any current complaints.  His abdomen is soft and without tenderness.  DRE was performed with nurse chaperone present.  Stool was light in color and was Hemoccult negative.  On his lab work, patient's hemoglobin is at baseline level.  Patient and daughter were informed of these lab results.  They do feel comfortable with discharge home.   Social Determinants of Health:  Has access to outpatient care through the [place].  Receives his cancer care at [facility].          Final Clinical Impression(s) / ED Diagnoses Final diagnoses:  Dark stools  Rx / DC Orders ED Discharge Orders     None         Godfrey Pick, MD 09/29/21 1658

## 2021-10-01 ENCOUNTER — Inpatient Hospital Stay: Payer: Medicare Other | Attending: Hematology

## 2021-10-01 ENCOUNTER — Ambulatory Visit (HOSPITAL_COMMUNITY)
Admission: RE | Admit: 2021-10-01 | Discharge: 2021-10-01 | Disposition: A | Discharge status: Home / Self Care | Payer: Medicare Other | Source: Ambulatory Visit | Attending: Hematology | Admitting: Hematology

## 2021-10-01 DIAGNOSIS — R972 Elevated prostate specific antigen [PSA]: Secondary | ICD-10-CM | POA: Insufficient documentation

## 2021-10-01 DIAGNOSIS — C7971 Secondary malignant neoplasm of right adrenal gland: Secondary | ICD-10-CM | POA: Diagnosis not present

## 2021-10-01 DIAGNOSIS — R630 Anorexia: Secondary | ICD-10-CM | POA: Diagnosis not present

## 2021-10-01 DIAGNOSIS — Z79899 Other long term (current) drug therapy: Secondary | ICD-10-CM | POA: Diagnosis not present

## 2021-10-01 DIAGNOSIS — R42 Dizziness and giddiness: Secondary | ICD-10-CM | POA: Diagnosis not present

## 2021-10-01 DIAGNOSIS — M5489 Other dorsalgia: Secondary | ICD-10-CM | POA: Diagnosis not present

## 2021-10-01 DIAGNOSIS — C61 Malignant neoplasm of prostate: Secondary | ICD-10-CM | POA: Insufficient documentation

## 2021-10-01 DIAGNOSIS — M85831 Other specified disorders of bone density and structure, right forearm: Secondary | ICD-10-CM | POA: Diagnosis not present

## 2021-10-01 LAB — COMPREHENSIVE METABOLIC PANEL
ALT: 20 U/L (ref 0–44)
AST: 24 U/L (ref 15–41)
Albumin: 3.5 g/dL (ref 3.5–5.0)
Alkaline Phosphatase: 71 U/L (ref 38–126)
Anion gap: 8 (ref 5–15)
BUN: 29 mg/dL — ABNORMAL HIGH (ref 8–23)
CO2: 22 mmol/L (ref 22–32)
Calcium: 9.2 mg/dL (ref 8.9–10.3)
Chloride: 104 mmol/L (ref 98–111)
Creatinine, Ser: 1.68 mg/dL — ABNORMAL HIGH (ref 0.61–1.24)
GFR, Estimated: 38 mL/min — ABNORMAL LOW (ref >60)
Glucose, Bld: 109 mg/dL — ABNORMAL HIGH (ref 70–99)
Potassium: 4.5 mmol/L (ref 3.5–5.1)
Sodium: 134 mmol/L — ABNORMAL LOW (ref 135–145)
Total Bilirubin: 0.9 mg/dL (ref 0.3–1.2)
Total Protein: 7 g/dL (ref 6.5–8.1)

## 2021-10-01 LAB — CBC WITH DIFFERENTIAL/PLATELET
Abs Immature Granulocytes: 0.01 10*3/uL (ref 0.00–0.07)
Basophils Absolute: 0 10*3/uL (ref 0.0–0.1)
Basophils Relative: 1 %
Eosinophils Absolute: 0 10*3/uL (ref 0.0–0.5)
Eosinophils Relative: 1 %
HCT: 34 % — ABNORMAL LOW (ref 39.0–52.0)
Hemoglobin: 11.4 g/dL — ABNORMAL LOW (ref 13.0–17.0)
Immature Granulocytes: 0 %
Lymphocytes Relative: 43 %
Lymphs Abs: 1.8 10*3/uL (ref 0.7–4.0)
MCH: 34.3 pg — ABNORMAL HIGH (ref 26.0–34.0)
MCHC: 33.5 g/dL (ref 30.0–36.0)
MCV: 102.4 fL — ABNORMAL HIGH (ref 80.0–100.0)
Monocytes Absolute: 0.5 10*3/uL (ref 0.1–1.0)
Monocytes Relative: 11 %
Neutro Abs: 1.9 10*3/uL (ref 1.7–7.7)
Neutrophils Relative %: 44 %
Platelets: 258 10*3/uL (ref 150–400)
RBC: 3.32 MIL/uL — ABNORMAL LOW (ref 4.22–5.81)
RDW: 12.8 % (ref 11.5–15.5)
WBC: 4.3 10*3/uL (ref 4.0–10.5)
nRBC: 0 % (ref 0.0–0.2)

## 2021-10-01 LAB — MAGNESIUM: Magnesium: 1.9 mg/dL (ref 1.7–2.4)

## 2021-10-01 LAB — PSA: Prostatic Specific Antigen: 2.27 ng/mL (ref 0.00–4.00)

## 2021-10-06 ENCOUNTER — Inpatient Hospital Stay (HOSPITAL_BASED_OUTPATIENT_CLINIC_OR_DEPARTMENT_OTHER): Payer: Medicare Other | Admitting: Hematology

## 2021-10-06 ENCOUNTER — Other Ambulatory Visit: Payer: Self-pay | Admitting: *Deleted

## 2021-10-06 ENCOUNTER — Other Ambulatory Visit (HOSPITAL_COMMUNITY): Payer: Self-pay | Admitting: Hematology

## 2021-10-06 ENCOUNTER — Other Ambulatory Visit (HOSPITAL_COMMUNITY): Payer: Self-pay

## 2021-10-06 VITALS — BP 144/57 | HR 73 | Temp 98.7°F | Resp 18 | Ht 73.0 in | Wt 179.0 lb

## 2021-10-06 DIAGNOSIS — C61 Malignant neoplasm of prostate: Secondary | ICD-10-CM

## 2021-10-06 DIAGNOSIS — M8580 Other specified disorders of bone density and structure, unspecified site: Secondary | ICD-10-CM | POA: Diagnosis not present

## 2021-10-06 MED ORDER — NUBEQA 300 MG PO TABS
300.0000 mg | ORAL_TABLET | Freq: Two times a day (BID) | ORAL | 0 refills | Status: DC
  Filled 2021-10-06: qty 60, 30d supply, fill #0

## 2021-10-06 NOTE — Progress Notes (Signed)
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    CANCER STAGING:  Cancer Staging  Prostate cancer [employer]) Staging form: Prostate, AJCC 8th Edition - Clinical stage from 07/10/2020: Stage IVB (cTX, cNX, pM1c, PSA: 10.2) - Unsigned   INTERVAL HISTORY:  Derrick Long, a 88 y.o. male, seen for follow-up of her metastatic prostate cancer.  He complains of occasional dizziness.  He is taking tramadol 50 mg in the mornings for pain.  Weight has been stable and he is eating better.  He reportedly fell about 5 weeks ago most likely from dizziness.  REVIEW OF SYSTEMS:  Review of Systems  Constitutional:  Negative for appetite change and fatigue.  Musculoskeletal:  Negative for back pain.  Neurological:  Positive for dizziness.  Psychiatric/Behavioral:  Negative for depression and sleep disturbance.   All other systems reviewed and are negative.   PAST MEDICAL/SURGICAL HISTORY:  Past Medical History:  Diagnosis Date   Acid reflux    Anemia    Colon cancer ([place])    per patient, colon cancer more than 10 years ago s/p right hemicolectomy   Ex-cigarette smoker    Gout    Hyperlipemia    Hypertension    Paroxysmal atrial fibrillation ([place]) 2014   Prostate cancer ([place]) 08/08/2013   SEEDS 20 YEARS AGO   Prostate disease    Past Surgical History:  Procedure Laterality Date   BIOPSY  02/05/2021   Procedure: BIOPSY;  Surgeon: Harvel Quale, MD;  Location: [facility];  Service: Gastroenterology;;   BIOPSY  08/02/2021   Procedure: BIOPSY;  Surgeon: Daneil Dolin, MD;  Location: [facility]  ENDO SUITE;  Service: Endoscopy;;   COLON SURGERY     Right hemicolectomy in the setting of colon cancer, per patient   CYSTOSCOPY WITH INSERTION OF UROLIFT N/A 05/19/2021   Procedure: CYSTOSCOPY WITH INSERTION OF UROLIFT;  Surgeon: Cleon Gustin, MD;  Location: [facility] ORS;  Service: Urology;  Laterality: N/A;   ESOPHAGOGASTRODUODENOSCOPY (EGD) WITH PROPOFOL N/A 02/05/2021   Procedure: ESOPHAGOGASTRODUODENOSCOPY (EGD) WITH PROPOFOL;  Surgeon: Harvel Quale, MD;  Location: [facility];  Service: Gastroenterology;  Laterality: N/A;   ESOPHAGOGASTRODUODENOSCOPY (EGD) WITH PROPOFOL N/A 08/02/2021   Procedure: ESOPHAGOGASTRODUODENOSCOPY (EGD) WITH PROPOFOL;  Surgeon: Daneil Dolin, MD;  Location: [facility];  Service: Endoscopy;  Laterality: N/A;   HOT HEMOSTASIS  08/02/2021   Procedure: HOT HEMOSTASIS (ARGON PLASMA COAGULATION/BICAP);  Surgeon: Daneil Dolin, MD;  [facility];  Service: Endoscopy;;   LEFT HEART CATHETERIZATION WITH CORONARY ANGIOGRAM N/A 07/21/2012   Procedure: LEFT HEART CATHETERIZATION WITH CORONARY ANGIOGRAM;  Surgeon: Troy Sine, MD;  Location: [facility] CATH LAB;  Service: Cardiovascular;  Laterality: N/A;   PACEMAKER INSERTION     PERMANENT PACEMAKER INSERTION N/A 07/22/2012   Procedure: PERMANENT PACEMAKER INSERTION;  Surgeon: Sanda Klein, MD;  Location: [facility] CATH LAB;  Service: Cardiovascular;  Laterality: N/A;   PROSTATE SURGERY      SOCIAL HISTORY:  Social History   Socioeconomic History   Marital status: Married    Spouse name: Not on  file   Number of children: Not on file   Years of education: Not on file   Highest education level: Not on file  Occupational History   Not on file  Tobacco Use   Smoking status: Former    Types: Cigarettes    Quit date: 07/20/2002    Years since quitting: 19.2   Smokeless tobacco: Never  Vaping Use   Vaping Use: Never used  Substance and Sexual Activity   Alcohol use: No   Drug use: No   Sexual  activity: Not on file  Other Topics Concern   Not on file  Social History Narrative   Not on file   Social Determinants of Health   Financial Resource Strain: Low Risk  (05/16/2020)   Overall Financial Resource Strain (CARDIA)    Difficulty of Paying Living Expenses: Not hard at all  Food Insecurity: No Food Insecurity (05/16/2020)   Hunger Vital Sign    Worried About Running Out of Food in the Last Year: Never true    [place] in the Last Year: Never true  Transportation Needs: No Transportation Needs (05/16/2020)   PRAPARE - Hydrologist (Medical): No    Lack of Transportation (Non-Medical): No  Physical Activity: Insufficiently Active (05/16/2020)   Exercise Vital Sign    Days of Exercise per Week: 3 days    Minutes of Exercise per Session: 30 min  Stress: No Stress Concern Present (05/16/2020)   [place]    Feeling of Stress : Not at all  Social Connections: Moderately Integrated (05/16/2020)   Social Connection and Isolation Panel [NHANES]    Frequency of Communication with Friends and Family: More than three times a week    Frequency of Social Gatherings with Friends and Family: More than three times a week    Attends Religious Services: More than 4 times per year    Active Member of Clubs or Organizations: No    Attends Archivist Meetings: 1 to 4 times per year    Marital Status: Widowed  Intimate Partner Violence: Not At Risk (05/16/2020)   Humiliation, Afraid, Rape, and Kick questionnaire    Fear of Current or Ex-Partner: No    Emotionally Abused: No    Physically Abused: No    Sexually Abused: No    FAMILY HISTORY:  Family History  Problem Relation Age of Onset   Cancer Neg Hx     CURRENT MEDICATIONS:  Current Outpatient Medications  Medication Sig Dispense Refill   acetaminophen (TYLENOL) 650 MG CR tablet Take 1,300 mg by mouth every 8 (eight) hours  as needed for pain.     ascorbic acid (VITAMIN C) 250 MG tablet Take 250 mg by mouth 3 (three) times daily. Take with Iron (Ferrous Sulfate) to promote better absorption     atorvastatin (LIPITOR) 40 MG tablet TAKE ONE-HALF TABLET BY MOUTH AT BEDTIME FOR CHOLESTEROL     carvedilol (COREG) 3.125 MG tablet Take 1 tablet (3.125 mg total) by mouth 2 (two) times daily. 60 tablet 0   Cholecalciferol 25 MCG (1000 UT) tablet Take 1,000 Units by mouth daily.     clotrimazole-betamethasone (LOTRISONE) cream Apply 1 application. topically 2 (two) times daily. 30 g 0   Cyanocobalamin 1000 MCG CAPS Take 1 tablet by mouth daily.     diclofenac sodium (VOLTAREN) 1 % GEL Apply 2 g topically 4 (four) times daily. 100 g 0  Ensure (ENSURE) Take 237 mLs by mouth daily as needed (nutrition). Vanilla or chocolate     ferrous sulfate 324 MG TBEC Take 324 mg by mouth 2 (two) times daily.     fluticasone (FLONASE) 50 MCG/ACT nasal spray Place 2 sprays into both nostrils daily. (Patient taking differently: Place 2 sprays into both nostrils daily as needed for allergies.) 16 g 2   furosemide (LASIX) 40 MG tablet Take 40 mg by mouth daily.     magnesium oxide (MAG-OX) 400 (240 Mg) MG tablet Take 1 tablet (400 mg total) by mouth 3 (three) times daily. 90 tablet 3   megestrol (MEGACE) 400 MG/10ML suspension Take 10 mLs (400 mg total) by mouth 2 (two) times daily. 480 mL 2   pantoprazole (PROTONIX) 40 MG tablet TAKE 1 TABLET BY MOUTH ONCE DAILY. (Patient taking differently: Take 40 mg by mouth daily.) 30 tablet 0   potassium chloride SA (KLOR-CON) 20 MEQ tablet Take 20 mEq by mouth daily.     predniSONE (DELTASONE) 5 MG tablet Take 1 tablet (5 mg total) by mouth every morning. Continuous 30 tablet 3   rivaroxaban (XARELTO) 20 MG TABS tablet Take 1 tablet (20 mg total) by mouth daily with supper. 20 tablet 0   tamsulosin (FLOMAX) 0.4 MG CAPS capsule Take 1 capsule (0.4 mg total) by mouth daily after supper. 30 capsule 11    traMADol (ULTRAM) 50 MG tablet Take 1 tablet (50 mg total) by mouth every 12 (twelve) hours as needed. 20 tablet 0   darolutamide (NUBEQA) 300 MG tablet Take 1 tablet (300 mg total) by mouth 2 (two) times daily with a meal. 60 tablet 0   No current facility-administered medications for this visit.   Facility-Administered Medications Ordered in Other Visits  Medication Dose Route Frequency Provider Last Rate Last Admin   octreotide (SANDOSTATIN LAR) 20 MG IM injection             ALLERGIES:  No Known Allergies  PHYSICAL EXAM:  Performance status (ECOG): 1 - Symptomatic but completely ambulatory  Vitals:   10/06/21 1009  BP: (!) 144/57  Pulse: 73  Resp: 18  Temp: 98.7 F (37.1 C)  SpO2: 98%   Wt Readings from Last 3 Encounters:  10/06/21 179 lb (81.2 kg)  09/29/21 182 lb (82.6 kg)  08/26/21 181 lb 4.8 oz (82.2 kg)   Physical Exam Vitals reviewed.  Constitutional:      Appearance: Normal appearance.     Comments: In wheelchair  Cardiovascular:     Rate and Rhythm: Normal rate and regular rhythm.     Pulses: Normal pulses.     Heart sounds: Normal heart sounds.  Pulmonary:     Effort: Pulmonary effort is normal.     Breath sounds: Normal breath sounds.  Musculoskeletal:     Right lower leg: No edema.     Left lower leg: No edema.  Neurological:     General: No focal deficit present.     Mental Status: He is alert and oriented to person, place, and time.  Psychiatric:        Mood and Affect: Mood normal.        Behavior: Behavior normal.      LABORATORY DATA:  I have reviewed the labs as listed.     Latest Ref Rng & Units 10/01/2021    9:43 AM 09/29/2021    3:23 PM 08/26/2021    9:05 AM  CBC  WBC 4.0 - 10.5 K/uL 4.3  5.4  5.9   Hemoglobin 13.0 - 17.0 g/dL 11.4  11.4  11.3   Hematocrit 39.0 - 52.0 % 34.0  32.8  33.0   Platelets 150 - 400 K/uL 258  273  199       Latest Ref Rng & Units 10/01/2021    9:43 AM 09/29/2021    3:23 PM 08/26/2021    9:05 AM  CMP   Glucose 70 - 99 mg/dL 109  120  106   BUN 8 - 23 mg/dL 29  24  34   Creatinine 0.61 - 1.24 mg/dL 1.68  1.48  1.74   Sodium 135 - 145 mmol/L 134  133  130   Potassium 3.5 - 5.1 mmol/L 4.5  4.2  4.2   Chloride 98 - 111 mmol/L 104  106  103   CO2 22 - 32 mmol/L '22  20  22   '$ Calcium 8.9 - 10.3 mg/dL 9.2  9.0  8.9   Total Protein 6.5 - 8.1 g/dL 7.0  6.8  6.7   Total Bilirubin 0.3 - 1.2 mg/dL 0.9  0.8  1.1   Alkaline Phos 38 - 126 U/L 71  66  53   AST 15 - 41 U/L '24  19  20   '$ ALT 0 - 44 U/L '20  19  22     '$ DIAGNOSTIC IMAGING:  I have independently reviewed the scans and discussed with the patient. DG Bone Density  Result Date: 10/01/2021 EXAM: DUAL X-RAY ABSORPTIOMETRY (DXA) FOR BONE MINERAL DENSITY IMPRESSION: Your patient Kairav Russomanno completed a BMD test on 10/01/2021 using the [place] (software version: 14.10) manufactured by [employer]. The following summarizes the results of our evaluation. Technologist: [facility] PATIENT BIOGRAPHICAL: Name: Derrick Long, Derrick Long Patient ID: [id number] Birth Date: [birth year] Height: 73.0 in. Gender: Male Exam Date: 10/01/2021 Weight: 182.0 lbs. Indications: Advanced Age, Follow up Osteopenia, Height Loss, Prostate Cancer, Vitamin D Deficiency, Secondary Osteoporosis Fractures: Treatments: Asprin, Calcium, Vitamin D DENSITOMETRY RESULTS: Site          Region     Measured Date Measured Age WHO Classification Young Adult T-score BMD         %Change vs. Previous Significant Change (*) DualFemur Neck Left 10/01/2023 93.3 N/A -1.1 0.933 g/cm2 -3.5% - DualFemur Neck Left 11/06/2011 83.4 N/A -0.8 0.967 g/cm2 6.1% Yes DualFemur Neck Left 10[birth year]07/2005 77.3 N/A -1.2 0.911 g/cm2 - - DualFemur Total Mean 10/01/2021 93.3 N/A -0.2 1.073 g/cm2 -2.3% Yes DualFemur Total Mean 11/06/2011 83.4 N/A 0.0 1.098 g/cm2 4.8% Yes DualFemur Total Mean 10/07/2005 77.3 N/A -0.4 1.048 g/cm2 - - Right Forearm Radius 33% 10/01/2021 93.3 N/A -1.7 0.672 g/cm2 -20.4% Yes Right  Forearm Radius 33% 11/06/2011 83.4 N/A 0.5 0.844 g/cm2 -4.7% - Right Forearm Radius 33% 10/07/2005 77.3 N/A 1.0 0.885 g/cm2 - - ASSESSMENT: BMD as determined from Forearm Radius 33% is 0.672 g/cm2 with a T-score of -1.7. This patient is considered osteopenic by World Healh Organization (WHO) Criteria. Compared with the prior study on 11/06/11, the BMD of the total mean and rt. forearm show a statistically significant decrease. Lumbar spine was excluded due to advanced degenerative changes. Patient is not a candidate for FRAX assessment due to age limit. World Pharmacologist [facility]) criteria for post-menopausal, Caucasian Women: Normal:       T-score at or above -1 SD Osteopenia:   T-score between -1 and -2.5 SD Osteoporosis: T-score at or below -2.5 SD RECOMMENDATIONS: 1. All patients should optimize  calcium and vitamin D intake. 2. Consider FDA-approved medical therapies in postmenopausal women and med aged 56 years and older, based on the following: a. A hip or vertebral (clinical or morphometric) fracture b. T-score< -2.5 at the femoral neck or spine after appropriate evaluation to exclude secondary causes c. Low bone mass (T-score between -1.0 and -2.5 at the femoral neck or spine) and a 10-year probability of a hip fracture > 3% or a 10-year probability of a major osteoporosis-related fracture > 20% based on the [place]-adapted WHO algorithm d. Clinician judgment and/or patient preferences may indicate treatment for people with 10-year fracture probabilities above or below these levels FOLLOW-UP: People with diagnosed cases of osteoporosis or osteopenia should be regularly tested for bone mineral density. For patients eligible for Medicare, routine testing is allowed once every 2 years. Testing frequency can be increased for patients who have rapidly progressing disease, or for those who are receiving medical therapy to restore bone mass. I have reviewed this report, and agree with the above findings. [facility] Electronically Signed   By: Franki Cabot M.D.   On: 10/01/2021 11:18   XR Clavicle Right  Result Date: 09/17/2021 AP and axial views of right clavicle reviewed.  Lateral clavicle fracture again noted with no significant displacement compared with prior radiographs.  Callus formation noted around the clavicle fracture.    ASSESSMENT:  1.  Metastatic prostate cancer to the right adrenal gland: -Presentation to the ER with worsening right-sided back pain, which has been on and off for the last couple of years. - Some decrease in appetite with 6 pound weight loss in the last 6 months.  No hematuria or other B symptoms. - CT renal study on 05/03/2020 showed new right adrenal mass with bilateral renal lesions.  Indeterminate L2 vertebral bodies sclerotic lesion.  Low-attenuation lesions in the kidneys measure up to 2.3 cm on the right and were similar to scan from July 2021. - CT CAP on 06/12/2020 did not show any evidence of nodal metastasis or metastatic disease in the chest.  Right adrenal mass measures 4.2 x 3.8 cm exerts mass-effect upon the posterior and right lateral wall of the IVC.  Right kidney masses measuring 2.3 x 2.3 cm and 1.9 x 1.7 cm suspicious for RCC.  Upper pole of the left kidney lesion 2.2 x 2.0 cm and medial cortex of the inferior pole measuring 2 x 2 cm and another interpolar left kidney lesion measuring 1.3 x 1.5 cm. - Bone scan on 06/13/2020 was negative. - Right adrenal biopsy consistent with prostatic adenocarcinoma - Degarelix on 07/16/2020, transition to Eligard 45 mg on 08/21/2020. - Abiraterone 100 mg daily/prednisone 5 mg daily started around 01/22/2021.,  Discontinued in April 2023.  Not a candidate for Erleada due to blood thinner. - Darolutamide 300 mg twice daily started on 06/19/2021, increased to 600 mg twice daily on 06/26/2021, cut back to 300 mg twice daily on 07/07/2021 due to decreased oral intake and tiredness.   2.  Social/family history: - He lives at  home with his daughter.  He walks with a cane. - He mows lawn with a riding mower.  He has a garden and gross vegetables.  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  Quit smoking 25 years ago. - No family history of malignancies that he is aware of.   PLAN:  .  Metastatic prostate cancer to the right adrenal gland: - He is tolerating darolutamide 300 mg twice daily very well.  Reviewed labs from 10/01/2021 which showed normal LFTs.  PSA is 2.27, slightly up from 1.73 on 07/21/2021.  Overall its come down from 5.97 on 05/26/2021.  Hence recommend continuing darolutamide 300 mg twice daily as he could not tolerate higher dose.  He also has dizziness but he is also on other cardiac medication including carvedilol and Lasix which could contribute to dizziness.  RTC 2 months with repeat labs.    2.  Right sided back pain: - Can continue tramadol 50 mg in the mornings as needed which is helping.  3.  Hypomagnesemia: - Continue magnesium 3 times daily.  Magnesium is normal today.  4.  Decreased appetite: - Tinea Megace twice daily.  Weight has been stable.   Orders placed this encounter:  Orders Placed This Encounter  Procedures   CBC with Differential   Comprehensive metabolic panel   Magnesium   PSA   VITAMIN D 25 Hydroxy (Vit-D Deficiency, Fractures)      Derek Jack, MD [place] [phone]   I, Thana Ates, am acting as a scribe for Dr. Derek Jack.  I, Derek Jack MD, have reviewed the above documentation for accuracy and completeness, and I agree with the above.

## 2021-10-06 NOTE — Patient Instructions (Signed)
[place] at [facility] Discharge Instructions   You were seen and examined today by Dr. Delton Coombes.  He reviewed your lab work which is normal/stable.  Continue Nubeqa as prescribed.   To help with the dizzy episodes. Sit on the side of the bed and hang your legs over for about 5 minutes trying to stand.  Return as scheduled in 2 months.    Thank you for choosing [place] at [facility] to provide your oncology and hematology care.  To afford each patient quality time with our provider, please arrive at least 15 minutes before your scheduled appointment time.   If you have a lab appointment with the [facility] please come in thru the Main Entrance and check in at the main information desk.  You need to re-schedule your appointment should you arrive 10 or more minutes late.  We strive to give you quality time with our providers, and arriving late affects you and other patients whose appointments are after yours.  Also, if you no show three or more times for appointments you may be dismissed from the clinic at the providers discretion.     Again, thank you for choosing [facility].  Our hope is that these requests will decrease the amount of time that you wait before being seen by our physicians.       _____________________________________________________________  Should you have questions after your visit to [facility], please contact our office at [phone] and follow the prompts.  Our office hours are 8:00 a.m. and 4:30 p.m. Monday - Friday.  Please note that voicemails left after 4:00 p.m. may not be returned until the following business day.  We are closed weekends and major holidays.  You do have access to a nurse 24-7, just call the main number to the clinic [phone] and do not press any options, hold on the line and a nurse will answer the phone.    For prescription refill requests, have your  pharmacy contact our office and allow 72 hours.    Due to Covid, you will need to wear a mask upon entering the hospital. If you do not have a mask, a mask will be given to you at the Main Entrance upon arrival. For doctor visits, patients may have 1 support person age 86 or older with them. For treatment visits, patients can not have anyone with them due to social distancing guidelines and our immunocompromised population.

## 2021-10-06 NOTE — Progress Notes (Signed)
Patient is taking Nubeqa as prescribed.  He has not missed any doses and reports no side effects at this time.   

## 2021-10-06 NOTE — Telephone Encounter (Signed)
Refill approved for Nubeqa.  Patient tolerating and is to continue therapy.

## 2021-10-07 ENCOUNTER — Other Ambulatory Visit (INDEPENDENT_AMBULATORY_CARE_PROVIDER_SITE_OTHER): Payer: Self-pay | Admitting: *Deleted

## 2021-10-07 ENCOUNTER — Telehealth (INDEPENDENT_AMBULATORY_CARE_PROVIDER_SITE_OTHER): Payer: Self-pay | Admitting: *Deleted

## 2021-10-07 DIAGNOSIS — D539 Nutritional anemia, unspecified: Secondary | ICD-10-CM

## 2021-10-07 DIAGNOSIS — R1011 Right upper quadrant pain: Secondary | ICD-10-CM

## 2021-10-07 NOTE — Telephone Encounter (Signed)
Patient was in reminder file to do cbc with diff before appt on 8/31 per dr Sydell Axon.  I put in order and pt is aware to do before office visit on 8/31.

## 2021-10-08 ENCOUNTER — Ambulatory Visit: Payer: Medicare Other | Admitting: Hematology

## 2021-10-12 ENCOUNTER — Observation Stay (HOSPITAL_BASED_OUTPATIENT_CLINIC_OR_DEPARTMENT_OTHER)
Admission: EM | Admit: 2021-10-12 | Discharge: 2021-10-15 | Disposition: A | Discharge status: Home Health | Payer: No Typology Code available for payment source | Attending: Internal Medicine | Admitting: Internal Medicine

## 2021-10-12 ENCOUNTER — Other Ambulatory Visit: Payer: Self-pay

## 2021-10-12 ENCOUNTER — Encounter (HOSPITAL_BASED_OUTPATIENT_CLINIC_OR_DEPARTMENT_OTHER): Payer: Self-pay | Admitting: Emergency Medicine

## 2021-10-12 ENCOUNTER — Emergency Department (HOSPITAL_BASED_OUTPATIENT_CLINIC_OR_DEPARTMENT_OTHER): Payer: No Typology Code available for payment source

## 2021-10-12 ENCOUNTER — Emergency Department (HOSPITAL_BASED_OUTPATIENT_CLINIC_OR_DEPARTMENT_OTHER): Payer: No Typology Code available for payment source | Admitting: Radiology

## 2021-10-12 DIAGNOSIS — I10 Essential (primary) hypertension: Secondary | ICD-10-CM | POA: Diagnosis present

## 2021-10-12 DIAGNOSIS — Z87891 Personal history of nicotine dependence: Secondary | ICD-10-CM | POA: Diagnosis not present

## 2021-10-12 DIAGNOSIS — I480 Paroxysmal atrial fibrillation: Secondary | ICD-10-CM | POA: Diagnosis present

## 2021-10-12 DIAGNOSIS — R55 Syncope and collapse: Principal | ICD-10-CM | POA: Diagnosis present

## 2021-10-12 DIAGNOSIS — Z950 Presence of cardiac pacemaker: Secondary | ICD-10-CM | POA: Diagnosis present

## 2021-10-12 DIAGNOSIS — Z7901 Long term (current) use of anticoagulants: Secondary | ICD-10-CM | POA: Insufficient documentation

## 2021-10-12 DIAGNOSIS — D509 Iron deficiency anemia, unspecified: Secondary | ICD-10-CM | POA: Insufficient documentation

## 2021-10-12 DIAGNOSIS — D500 Iron deficiency anemia secondary to blood loss (chronic): Secondary | ICD-10-CM | POA: Diagnosis present

## 2021-10-12 DIAGNOSIS — C61 Malignant neoplasm of prostate: Secondary | ICD-10-CM | POA: Diagnosis not present

## 2021-10-12 DIAGNOSIS — Z85038 Personal history of other malignant neoplasm of large intestine: Secondary | ICD-10-CM | POA: Insufficient documentation

## 2021-10-12 DIAGNOSIS — Z79899 Other long term (current) drug therapy: Secondary | ICD-10-CM | POA: Insufficient documentation

## 2021-10-12 DIAGNOSIS — A419 Sepsis, unspecified organism: Secondary | ICD-10-CM | POA: Insufficient documentation

## 2021-10-12 DIAGNOSIS — N1832 Chronic kidney disease, stage 3b: Secondary | ICD-10-CM | POA: Insufficient documentation

## 2021-10-12 DIAGNOSIS — K219 Gastro-esophageal reflux disease without esophagitis: Secondary | ICD-10-CM | POA: Diagnosis present

## 2021-10-12 DIAGNOSIS — Z20822 Contact with and (suspected) exposure to covid-19: Secondary | ICD-10-CM | POA: Insufficient documentation

## 2021-10-12 DIAGNOSIS — E782 Mixed hyperlipidemia: Secondary | ICD-10-CM | POA: Diagnosis present

## 2021-10-12 DIAGNOSIS — I129 Hypertensive chronic kidney disease with stage 1 through stage 4 chronic kidney disease, or unspecified chronic kidney disease: Secondary | ICD-10-CM | POA: Diagnosis not present

## 2021-10-12 DIAGNOSIS — E785 Hyperlipidemia, unspecified: Secondary | ICD-10-CM | POA: Diagnosis present

## 2021-10-12 DIAGNOSIS — N183 Chronic kidney disease, stage 3 unspecified: Secondary | ICD-10-CM | POA: Diagnosis present

## 2021-10-12 DIAGNOSIS — Z8679 Personal history of other diseases of the circulatory system: Secondary | ICD-10-CM

## 2021-10-12 DIAGNOSIS — R001 Bradycardia, unspecified: Secondary | ICD-10-CM | POA: Diagnosis present

## 2021-10-12 HISTORY — DX: Right upper quadrant pain: R10.11

## 2021-10-12 HISTORY — DX: Unspecified jaundice: R17

## 2021-10-12 HISTORY — DX: Other specified abnormal findings of blood chemistry: R79.89

## 2021-10-12 LAB — COMPREHENSIVE METABOLIC PANEL
ALT: 13 U/L (ref 0–44)
AST: 16 U/L (ref 15–41)
Albumin: 3.7 g/dL (ref 3.5–5.0)
Alkaline Phosphatase: 67 U/L (ref 38–126)
Anion gap: 10 (ref 5–15)
BUN: 24 mg/dL — ABNORMAL HIGH (ref 8–23)
CO2: 22 mmol/L (ref 22–32)
Calcium: 9.1 mg/dL (ref 8.9–10.3)
Chloride: 100 mmol/L (ref 98–111)
Creatinine, Ser: 1.28 mg/dL — ABNORMAL HIGH (ref 0.61–1.24)
GFR, Estimated: 52 mL/min — ABNORMAL LOW (ref >60)
Glucose, Bld: 107 mg/dL — ABNORMAL HIGH (ref 70–99)
Potassium: 3.8 mmol/L (ref 3.5–5.1)
Sodium: 132 mmol/L — ABNORMAL LOW (ref 135–145)
Total Bilirubin: 0.7 mg/dL (ref 0.3–1.2)
Total Protein: 6.7 g/dL (ref 6.5–8.1)

## 2021-10-12 LAB — URINALYSIS, ROUTINE W REFLEX MICROSCOPIC
Bilirubin Urine: NEGATIVE
Glucose, UA: NEGATIVE mg/dL
Hgb urine dipstick: NEGATIVE
Ketones, ur: NEGATIVE mg/dL
Leukocytes,Ua: NEGATIVE
Nitrite: NEGATIVE
Protein, ur: NEGATIVE mg/dL
Specific Gravity, Urine: 1.009 (ref 1.005–1.030)
pH: 5.5 (ref 5.0–8.0)

## 2021-10-12 LAB — CBC WITH DIFFERENTIAL/PLATELET
Abs Immature Granulocytes: 0.02 10*3/uL (ref 0.00–0.07)
Basophils Absolute: 0 10*3/uL (ref 0.0–0.1)
Basophils Relative: 0 %
Eosinophils Absolute: 0 10*3/uL (ref 0.0–0.5)
Eosinophils Relative: 0 %
HCT: 31.7 % — ABNORMAL LOW (ref 39.0–52.0)
Hemoglobin: 11.1 g/dL — ABNORMAL LOW (ref 13.0–17.0)
Immature Granulocytes: 0 %
Lymphocytes Relative: 19 %
Lymphs Abs: 1.1 10*3/uL (ref 0.7–4.0)
MCH: 33.9 pg (ref 26.0–34.0)
MCHC: 35 g/dL (ref 30.0–36.0)
MCV: 96.9 fL (ref 80.0–100.0)
Monocytes Absolute: 0.5 10*3/uL (ref 0.1–1.0)
Monocytes Relative: 9 %
Neutro Abs: 4.2 10*3/uL (ref 1.7–7.7)
Neutrophils Relative %: 72 %
Platelets: 301 10*3/uL (ref 150–400)
RBC: 3.27 MIL/uL — ABNORMAL LOW (ref 4.22–5.81)
RDW: 12.4 % (ref 11.5–15.5)
WBC: 5.9 10*3/uL (ref 4.0–10.5)
nRBC: 0 % (ref 0.0–0.2)

## 2021-10-12 LAB — MAGNESIUM: Magnesium: 1.7 mg/dL (ref 1.7–2.4)

## 2021-10-12 LAB — SARS CORONAVIRUS 2 BY RT PCR: SARS Coronavirus 2 by RT PCR: NEGATIVE

## 2021-10-12 LAB — PROTIME-INR
INR: 1.7 — ABNORMAL HIGH (ref 0.8–1.2)
Prothrombin Time: 19.4 seconds — ABNORMAL HIGH (ref 11.4–15.2)

## 2021-10-12 LAB — APTT: aPTT: 33 seconds (ref 24–36)

## 2021-10-12 LAB — TROPONIN I (HIGH SENSITIVITY)
Troponin I (High Sensitivity): 70 ng/L — ABNORMAL HIGH (ref <18)
Troponin I (High Sensitivity): 72 ng/L — ABNORMAL HIGH (ref <18)

## 2021-10-12 LAB — OCCULT BLOOD X 1 CARD TO LAB, STOOL: Fecal Occult Bld: NEGATIVE

## 2021-10-12 MED ORDER — SODIUM CHLORIDE 0.9 % IV BOLUS
1000.0000 mL | Freq: Once | INTRAVENOUS | Status: AC
  Administered 2021-10-12: 1000 mL via INTRAVENOUS

## 2021-10-12 NOTE — ED Triage Notes (Signed)
Pt is on xarelto. 

## 2021-10-12 NOTE — ED Notes (Signed)
Blood work was obtained around 1600 - 1630.

## 2021-10-12 NOTE — ED Provider Notes (Signed)
Pt received as signout from Dr Ernesto Rutherford  Briefly 88 yo male w/ hx of parox A Fib, bradycardia, 1st degree AV block, s/p [employer]'s pacemaker, on Eliquis, here with a fall in bathroom today.   CTH and C spine without acute traumatic injury here.  Pt had also been complaining of dark stools ECG June 2023 per my review of external records, showing per EGD report:  ECG here shows ongoing A Fib, rate controlled, no significant changes from prior tracings per my interpretation  Normal esophagus. Inflamed appearing gastric mucosa. 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  Labs and EKG are pending at this time.  Patient was signed out to Dr. Ranelle Oyster pending labs with plan for likely admission in the setting of syncope [VK]  2004 Admitted to hospitalist for near syncope evaluation [MT]    Clinical Course User Index [MT] Pranshu Lyster, Carola Rhine, MD [VK] Ottie Glazier, DO   Medical Decision Making Amount and/or Complexity of Data Reviewed Labs: ordered. Radiology: ordered.  Risk Decision regarding hospitalization.   Plan to admit Discussed with family at bedside Hgb stable, hemoccult negative - stool is dark on exam but may be due to secondary cause like iron use         Wyvonnia Dusky, MD 10/13/21 1025

## 2021-10-12 NOTE — ED Provider Notes (Signed)
[facility] DE[facility] Provider Note   CSN: [id number] Arrival date & time: 10/12/21  1142     History  Chief Complaint  Patient presents with   Derrick Long    Derrick Long is a 88 y.o. male.  Patient is a 88 year old male with a past medical history of A-fib on Eliquis, hypertension, hyperlipidemia and prostate cancer on oral chemotherapy presenting to the emergency department after a fall.  Patient states he was walking to the bathroom this morning when the next thing he knew he was on the ground.  His daughter states when she found him he was slightly disoriented but they are unsure if he had complete loss of consciousness.  The patient states prior to the fall he had no chest pain, shortness of breath or lightheadedness but states he has been lightheaded upon standing recently.  He states he has also had black tarry stools for the past month.  He states that he has had an endoscopy before and had a bleeding ulcer clipped and was evaluated since then and was told that his stools were negative for blood.  He denies any recent fevers or chills, nausea or vomiting, diarrhea or constipation.  He states he does get some mild lower abdominal pain with his bowel movements.  The history is provided by the patient and a relative.  Fall       Home Medications Prior to Admission medications   Medication Sig Start Date End Date Taking? Authorizing Provider  acetaminophen (TYLENOL) 650 MG CR tablet Take 1,300 mg by mouth every 8 (eight) hours as needed for pain.    [provider]  ascorbic acid (VITAMIN C) 250 MG tablet Take 250 mg by mouth 3 (three) times daily. Take with Iron (Ferrous Sulfate) to promote better absorption 03/18/21   [provider]  atorvastatin (LIPITOR) 40 MG tablet TAKE ONE-HALF TABLET BY MOUTH AT BEDTIME FOR CHOLESTEROL 08/13/21   [provider]  carvedilol (COREG) 3.125 MG tablet Take 1 tablet (3.125 mg total) by mouth 2 (two)  times daily. 03/28/21 05/15/23  Antonieta Pert, MD  Cholecalciferol 25 MCG (1000 UT) tablet Take 1,000 Units by mouth daily.    [provider]  clotrimazole-betamethasone (LOTRISONE) cream Apply 1 application. topically 2 (two) times daily. 05/21/21   McKenzie, Candee Furbish, MD  Cyanocobalamin 1000 MCG CAPS Take 1 tablet by mouth daily. 11/03/13   [provider]  darolutamide (NUBEQA) 300 MG tablet Take 1 tablet (300 mg total) by mouth 2 (two) times daily with a meal. 10/06/21   Derek Jack, MD  diclofenac sodium (VOLTAREN) 1 % GEL Apply 2 g topically 4 (four) times daily. 06/19/17   Rancour, Annie Main, MD  Ensure (ENSURE) Take 237 mLs by mouth daily as needed (nutrition). Vanilla or chocolate    [provider]  ferrous sulfate 324 MG TBEC Take 324 mg by mouth 2 (two) times daily.    [provider]  fluticasone (FLONASE) 50 MCG/ACT nasal spray Place 2 sprays into both nostrils daily. Patient taking differently: Place 2 sprays into both nostrils daily as needed for allergies. 02/15/16   Ward, Delice Bison, DO  furosemide (LASIX) 40 MG tablet Take 40 mg by mouth daily. 03/18/21   [provider]  magnesium oxide (MAG-OX) 400 (240 Mg) MG tablet Take 1 tablet (400 mg total) by mouth 3 (three) times daily. 06/02/21   Derek Jack, MD  megestrol (MEGACE) 400 MG/10ML suspension Take 10 mLs (400 mg total) by mouth 2 (  two) times daily. 03/20/21   Derek Jack, MD  pantoprazole (PROTONIX) 40 MG tablet TAKE 1 TABLET BY MOUTH ONCE DAILY. Patient taking differently: Take 40 mg by mouth daily. 07/22/21   Derek Jack, MD  potassium chloride SA (KLOR-CON) 20 MEQ tablet Take 20 mEq by mouth daily. 02/28/20   [provider]  predniSONE (DELTASONE) 5 MG tablet Take 1 tablet (5 mg total) by mouth every morning. Continuous 08/26/21   Derek Jack, MD  rivaroxaban (XARELTO) 20 MG TABS tablet Take 1 tablet (20 mg total) by mouth daily with supper.  08/06/21   Manuella Ghazi, Pratik D, DO  tamsulosin (FLOMAX) 0.4 MG CAPS capsule Take 1 capsule (0.4 mg total) by mouth daily after supper. 05/07/21   McKenzie, Candee Furbish, MD  traMADol (ULTRAM) 50 MG tablet Take 1 tablet (50 mg total) by mouth every 12 (twelve) hours as needed. 09/17/21   Magnant, Gerrianne Scale, PA-C      Allergies    Patient has no known allergies.    Review of Systems   Review of Systems  Physical Exam Updated Vital Signs BP (!) 156/74   Pulse 64   Temp 98.7 F (37.1 C)   Resp 18   SpO2 99%  Physical Exam Vitals and nursing note reviewed.  Constitutional:      General: He is not in acute distress.    Appearance: Normal appearance.  HENT:     Head: Normocephalic and atraumatic.     Mouth/Throat:     Mouth: Mucous membranes are moist.     Pharynx: Oropharynx is clear.  Eyes:     Extraocular Movements: Extraocular movements intact.     Conjunctiva/sclera: Conjunctivae normal.     Pupils: Pupils are equal, round, and reactive to light.  Neck:     Comments: No midline neck tenderness Cardiovascular:     Rate and Rhythm: Normal rate and regular rhythm.     Pulses: Normal pulses.     Heart sounds: Murmur (Systolic) heard.  Pulmonary:     Effort: Pulmonary effort is normal.     Breath sounds: Normal breath sounds.  Abdominal:     General: Abdomen is flat.     Palpations: Abdomen is soft.     Tenderness: There is no abdominal tenderness.  Musculoskeletal:        General: No swelling or tenderness. Normal range of motion.     Cervical back: Normal range of motion.  Skin:    General: Skin is warm and dry.     Findings: No bruising or rash.  Neurological:     General: No focal deficit present.     Mental Status: He is alert and oriented to person, place, and time.     Cranial Nerves: No cranial nerve deficit.     Sensory: No sensory deficit.     Motor: No weakness.  Psychiatric:        Mood and Affect: Mood normal.        Behavior: Behavior normal.     ED Results  / Procedures / Treatments   Labs (all labs ordered are listed, but only abnormal results are displayed) Labs Reviewed  COMPREHENSIVE METABOLIC PANEL  CBC WITH DIFFERENTIAL/PLATELET  PROTIME-INR  APTT  MAGNESIUM  POC OCCULT BLOOD, ED  TROPONIN I (HIGH SENSITIVITY)  TROPONIN I (HIGH SENSITIVITY)    EKG None  Radiology CT Cervical Spine Wo Contrast  Result Date: 10/12/2021 CLINICAL DATA:  Neck trauma (Age >= 65y) EXAM: CT CERVICAL SPINE WITHOUT CONTRAST  TECHNIQUE: Multidetector CT imaging of the cervical spine was performed without intravenous contrast. Multiplanar CT image reconstructions were also generated. RADIATION DOSE REDUCTION: This exam was performed according to the departmental dose-optimization program which includes automated exposure control, adjustment of the mA and/or kV according to patient size and/or use of iterative reconstruction technique. COMPARISON:  02/19/2021 FINDINGS: Alignment: Facet joints are aligned without dislocation or traumatic listhesis. Dens and lateral masses are aligned. Degenerative grade 1 anterolisthesis of C4 on C5 and C7 on T1. Skull base and vertebrae: No acute fracture. No primary bone lesion or focal pathologic process. Soft tissues and spinal canal: No prevertebral fluid or swelling. No visible canal hematoma. Disc levels: Severe degenerative disc disease of C5-6 and C6-7. Severe multilevel bilateral facet arthropathy. Upper chest: Included lung apices are clear. Other: Bilateral carotid atherosclerosis. IMPRESSION: 1. No acute fracture or traumatic listhesis of the cervical spine. 2. Severe multilevel cervical spondylosis. Electronically Signed   By: Davina Poke D.O.   On: 10/12/2021 14:36   CT Head Wo Contrast  Result Date: 10/12/2021 CLINICAL DATA:  Head trauma.  Fall. EXAM: CT HEAD WITHOUT CONTRAST TECHNIQUE: Contiguous axial images were obtained from the base of the skull through the vertex without intravenous contrast. RADIATION DOSE  REDUCTION: This exam was performed according to the departmental dose-optimization program which includes automated exposure control, adjustment of the mA and/or kV according to patient size and/or use of iterative reconstruction technique. COMPARISON:  Head CT 08/22/2021 FINDINGS: Brain: Stable cerebral atrophy. Diffuse low-density in the periventricular and subcortical white matter is again noted and suggestive for chronic changes. No evidence for acute hemorrhage, mass lesion, midline shift, hydrocephalus or large infarct. Vascular: No hyperdense vessel or unexpected calcification. Skull: Normal. Negative for fracture or focal lesion. Sinuses/Orbits: No acute finding. Other: None. IMPRESSION: 1. No acute intracranial abnormality. 2. Stable cerebral atrophy and evidence for chronic small vessel ischemic changes. Electronically Signed   By: Markus Daft M.D.   On: 10/12/2021 14:36   DG Shoulder Left  Result Date: 10/12/2021 CLINICAL DATA:  Left shoulder pain EXAM: LEFT SHOULDER - 2+ VIEW COMPARISON:  None Available. FINDINGS: There is no evidence of fracture or dislocation. There is no evidence of arthropathy or other focal bone abnormality. Soft tissues are unremarkable. IMPRESSION: Negative. Electronically Signed   By: Davina Poke D.O.   On: 10/12/2021 14:34   DG Chest 1 View  Result Date: 10/12/2021 CLINICAL DATA:  Syncope EXAM: CHEST  1 VIEW COMPARISON:  03/26/2021 FINDINGS: Left-sided implanted cardiac device remains in place. Heart size within normal limits. Aortic atherosclerosis. No focal airspace consolidation, pleural effusion, or pneumothorax. Subacute healing fracture of the distal right clavicle. IMPRESSION: 1. No acute cardiopulmonary findings. 2. Subacute healing fracture of the distal right clavicle. Electronically Signed   By: Davina Poke D.O.   On: 10/12/2021 14:30    Procedures Procedures    Medications Ordered in ED Medications - No data to display  ED Course/ Medical  Decision Making/ A&P Clinical Course as of 10/12/21 1539  Sun Oct 12, 2021  1538 CT imaging shows no acute disease.  Labs and EKG are pending at this time.  Patient was signed out to Dr. Ranelle Oyster pending labs with plan for likely admission in the setting of syncope [VK]    Clinical Course User Index [VK] Ottie Glazier, DO  Medical Decision Making This patient presents to the ED with chief complaint(s) of syncopal fall with pertinent past medical history of A-fib on Eliquis, prostate cancer, hypertension which further complicates the presenting complaint. The complaint involves an extensive differential diagnosis and also carries with it a high risk of complications and morbidity.    The differential diagnosis includes GI bleed, anemia, ACS, arrhythmia, electrolyte abnormality, ICH, mass effect, fracture  Additional history obtained: Additional history obtained from family Records reviewed previous admission documents and previous ED visit  ED Course and Reassessment: Patient will have CT imaging of his head and neck to evaluate for acute traumatic injuries as well as a chest and shoulder x-ray to evaluate for injury or cause of his syncope.  He will have labs and EKG performed.  He will require Hemoccult testing.  He will be monitored on telemetry monitor.  Independent labs interpretation:  The following labs were independently interpreted: Pending  Independent visualization of imaging: - I independently visualized the following imaging with scope of interpretation limited to determining acute life threatening conditions related to emergency care: CT head/C-spine, left shoulder x-ray, chest x-ray, which revealed no acute traumatic injury     Amount and/or Complexity of Data Reviewed Labs: ordered. Radiology: ordered.           Final Clinical Impression(s) / ED Diagnoses Final diagnoses:  None    Rx / DC Orders ED Discharge Orders      None         Ottie Glazier, DO 10/12/21 1541

## 2021-10-12 NOTE — ED Triage Notes (Signed)
Pt fell this am, walking to bathroom ,slipped and fell backwards, his left shoulder is hurting. He has had 4 falls in past 2 months. Stools have been tar black for a month. Minor abdominal pain all the time. Was evaluated by GI and did endoscopy and told he had a leakage that was clamped.

## 2021-10-13 ENCOUNTER — Encounter (HOSPITAL_COMMUNITY): Payer: Self-pay | Admitting: Internal Medicine

## 2021-10-13 ENCOUNTER — Observation Stay (HOSPITAL_BASED_OUTPATIENT_CLINIC_OR_DEPARTMENT_OTHER): Payer: No Typology Code available for payment source

## 2021-10-13 DIAGNOSIS — E782 Mixed hyperlipidemia: Secondary | ICD-10-CM

## 2021-10-13 DIAGNOSIS — D509 Iron deficiency anemia, unspecified: Secondary | ICD-10-CM

## 2021-10-13 DIAGNOSIS — R001 Bradycardia, unspecified: Secondary | ICD-10-CM | POA: Diagnosis not present

## 2021-10-13 DIAGNOSIS — E785 Hyperlipidemia, unspecified: Secondary | ICD-10-CM

## 2021-10-13 DIAGNOSIS — I480 Paroxysmal atrial fibrillation: Secondary | ICD-10-CM

## 2021-10-13 DIAGNOSIS — C61 Malignant neoplasm of prostate: Secondary | ICD-10-CM

## 2021-10-13 DIAGNOSIS — Z8679 Personal history of other diseases of the circulatory system: Secondary | ICD-10-CM

## 2021-10-13 DIAGNOSIS — I10 Essential (primary) hypertension: Secondary | ICD-10-CM

## 2021-10-13 DIAGNOSIS — R55 Syncope and collapse: Secondary | ICD-10-CM | POA: Diagnosis not present

## 2021-10-13 DIAGNOSIS — Z950 Presence of cardiac pacemaker: Secondary | ICD-10-CM

## 2021-10-13 DIAGNOSIS — N1832 Chronic kidney disease, stage 3b: Secondary | ICD-10-CM

## 2021-10-13 DIAGNOSIS — K219 Gastro-esophageal reflux disease without esophagitis: Secondary | ICD-10-CM

## 2021-10-13 LAB — BASIC METABOLIC PANEL
Anion gap: 6 (ref 5–15)
BUN: 21 mg/dL (ref 8–23)
CO2: 24 mmol/L (ref 22–32)
Calcium: 9.1 mg/dL (ref 8.9–10.3)
Chloride: 107 mmol/L (ref 98–111)
Creatinine, Ser: 1.2 mg/dL (ref 0.61–1.24)
GFR, Estimated: 56 mL/min — ABNORMAL LOW (ref >60)
Glucose, Bld: 93 mg/dL (ref 70–99)
Potassium: 3.4 mmol/L — ABNORMAL LOW (ref 3.5–5.1)
Sodium: 137 mmol/L (ref 135–145)

## 2021-10-13 LAB — ECHOCARDIOGRAM COMPLETE
AR max vel: 1.58 cm2
AV Area VTI: 1.59 cm2
AV Area mean vel: 1.52 cm2
AV Mean grad: 12.5 mmHg
AV Peak grad: 21.9 mmHg
Ao pk vel: 2.34 m/s
Area-P 1/2: 2.73 cm2
Calc EF: 69.6 %
Height: 73 in
S' Lateral: 2.6 cm
Single Plane A2C EF: 67 %
Single Plane A4C EF: 71.9 %
Weight: 2920.65 oz

## 2021-10-13 LAB — CBC
HCT: 31.8 % — ABNORMAL LOW (ref 39.0–52.0)
Hemoglobin: 10.9 g/dL — ABNORMAL LOW (ref 13.0–17.0)
MCH: 34.9 pg — ABNORMAL HIGH (ref 26.0–34.0)
MCHC: 34.3 g/dL (ref 30.0–36.0)
MCV: 101.9 fL — ABNORMAL HIGH (ref 80.0–100.0)
Platelets: 278 10*3/uL (ref 150–400)
RBC: 3.12 MIL/uL — ABNORMAL LOW (ref 4.22–5.81)
RDW: 12.5 % (ref 11.5–15.5)
WBC: 5.3 10*3/uL (ref 4.0–10.5)
nRBC: 0 % (ref 0.0–0.2)

## 2021-10-13 MED ORDER — TRAMADOL HCL 50 MG PO TABS
50.0000 mg | ORAL_TABLET | Freq: Two times a day (BID) | ORAL | Status: DC | PRN
  Administered 2021-10-13: 50 mg via ORAL
  Filled 2021-10-13: qty 1

## 2021-10-13 MED ORDER — SODIUM CHLORIDE 0.9% FLUSH
3.0000 mL | Freq: Two times a day (BID) | INTRAVENOUS | Status: DC
  Administered 2021-10-13 (×3): 3 mL via INTRAVENOUS

## 2021-10-13 MED ORDER — POTASSIUM CHLORIDE CRYS ER 20 MEQ PO TBCR
40.0000 meq | EXTENDED_RELEASE_TABLET | Freq: Once | ORAL | Status: AC
  Administered 2021-10-13: 40 meq via ORAL
  Filled 2021-10-13: qty 2

## 2021-10-13 MED ORDER — TAMSULOSIN HCL 0.4 MG PO CAPS
0.4000 mg | ORAL_CAPSULE | Freq: Every day | ORAL | Status: DC
  Administered 2021-10-13 – 2021-10-14 (×2): 0.4 mg via ORAL
  Filled 2021-10-13 (×2): qty 1

## 2021-10-13 MED ORDER — RIVAROXABAN 15 MG PO TABS
15.0000 mg | ORAL_TABLET | Freq: Every day | ORAL | Status: DC
  Administered 2021-10-13 – 2021-10-14 (×2): 15 mg via ORAL
  Filled 2021-10-13 (×2): qty 1

## 2021-10-13 MED ORDER — ACETAMINOPHEN 650 MG RE SUPP: 650.0000 mg | Freq: Four times a day (QID) | RECTAL | Status: DC | PRN

## 2021-10-13 MED ORDER — ATORVASTATIN CALCIUM 20 MG PO TABS
20.0000 mg | ORAL_TABLET | Freq: Every day | ORAL | Status: DC
  Administered 2021-10-14 – 2021-10-15 (×2): 20 mg via ORAL
  Filled 2021-10-13 (×2): qty 1

## 2021-10-13 MED ORDER — FUROSEMIDE 20 MG PO TABS: 20.0000 mg | ORAL_TABLET | Freq: Every day | ORAL | Status: DC

## 2021-10-13 MED ORDER — ENSURE ENLIVE PO LIQD: 237.0000 mL | Freq: Every day | ORAL | Status: DC | PRN

## 2021-10-13 MED ORDER — DAROLUTAMIDE 300 MG PO TABS: 300.0000 mg | ORAL_TABLET | Freq: Two times a day (BID) | ORAL | Status: DC

## 2021-10-13 MED ORDER — MEGESTROL ACETATE 400 MG/10ML PO SUSP
400.0000 mg | Freq: Two times a day (BID) | ORAL | Status: DC
  Administered 2021-10-13 – 2021-10-15 (×5): 400 mg via ORAL
  Filled 2021-10-13 (×6): qty 10

## 2021-10-13 MED ORDER — CARVEDILOL 3.125 MG PO TABS
3.1250 mg | ORAL_TABLET | Freq: Two times a day (BID) | ORAL | Status: DC
  Administered 2021-10-13 – 2021-10-15 (×5): 3.125 mg via ORAL
  Filled 2021-10-13 (×5): qty 1

## 2021-10-13 MED ORDER — FUROSEMIDE 40 MG PO TABS
40.0000 mg | ORAL_TABLET | Freq: Every day | ORAL | Status: DC
  Administered 2021-10-13: 40 mg via ORAL
  Filled 2021-10-13: qty 1

## 2021-10-13 MED ORDER — POLYETHYLENE GLYCOL 3350 17 G PO PACK: 17.0000 g | PACK | Freq: Every day | ORAL | Status: DC | PRN

## 2021-10-13 MED ORDER — DICLOFENAC SODIUM 1 % EX GEL
2.0000 g | Freq: Four times a day (QID) | CUTANEOUS | Status: DC
Start: 2021-10-13 — End: 2021-10-15
  Administered 2021-10-13 – 2021-10-15 (×8): 2 g via TOPICAL
  Filled 2021-10-13: qty 100

## 2021-10-13 MED ORDER — PANTOPRAZOLE SODIUM 40 MG PO TBEC
40.0000 mg | DELAYED_RELEASE_TABLET | Freq: Every day | ORAL | Status: DC
  Administered 2021-10-13 – 2021-10-15 (×3): 40 mg via ORAL
  Filled 2021-10-13 (×3): qty 1

## 2021-10-13 MED ORDER — MAGNESIUM SULFATE 2 GM/50ML IV SOLN
2.0000 g | Freq: Once | INTRAVENOUS | Status: AC
Start: 2021-10-13 — End: 2021-10-13
  Administered 2021-10-13: 2 g via INTRAVENOUS
  Filled 2021-10-13: qty 50

## 2021-10-13 MED ORDER — ACETAMINOPHEN 325 MG PO TABS: 650.0000 mg | ORAL_TABLET | Freq: Four times a day (QID) | ORAL | Status: DC | PRN

## 2021-10-13 MED ORDER — MAGNESIUM OXIDE -MG SUPPLEMENT 400 (240 MG) MG PO TABS
400.0000 mg | ORAL_TABLET | Freq: Three times a day (TID) | ORAL | Status: DC
  Administered 2021-10-13 – 2021-10-15 (×6): 400 mg via ORAL
  Filled 2021-10-13 (×6): qty 1

## 2021-10-13 MED ORDER — PREDNISONE 5 MG PO TABS
5.0000 mg | ORAL_TABLET | Freq: Every morning | ORAL | Status: DC
  Administered 2021-10-13 – 2021-10-15 (×3): 5 mg via ORAL
  Filled 2021-10-13 (×3): qty 1

## 2021-10-13 NOTE — H&P (Addendum)
History and Physical   PLES TRUDEL HKV:[id number] DOB: February 16, 1927 DOA: 10/12/2021  PCP: [facility] [place]   Patient coming from: Home  Chief Complaint: Fall  HPI: Derrick Long is a 88 y.o. male with medical history significant of bradycardia, first-degree AV block, paroxysmal A-fib, status post pacemaker, hypertension, prostate cancer, hyperlipidemia, anemia, GERD, gout, CKD 3, migraine, low back pain, insomnia, diastolic dysfunction presenting after a fall/syncope at home.  Patient reports that he is walked to the bathroom earlier this morning and the next he knew he was on the ground.  He does not report any prodrome but he does state he has been getting lightheaded on standing recently.  Also reports some dark stools in the past month and that he has history of GI bleed.  Daughter reported that when she found him down he had slight disorientation.  Unclear if he had full loss of consciousness, however it sounds like he did.  He denies fevers, chills, chest pain, shortness of breath, abdominal pain, constipation, diarrhea, nausea, vomiting.  ED Course: All signs in the ED significant for blood pressure in the 564P to 329J systolic.  Respiratory rate in the teens to 20s.  Lab work-up included CMP with sodium 132, BUN 24, creatinine stable 1.28, glucose 107.  CBC with hemoglobin stable at 11.1.  PT and INR elevated at 19.4 and 1.7 respectively.  Troponin flat at 70 and then 72 on repeat.  FOBT negative.  COVID screen negative.  Urinalysis without acute abnormality.  Magnesium normal.  Patient received a liter of fluids in the ED.  Admission requested for work-up of fall/syncope and collapse.  Review of Systems: As per HPI otherwise all other systems reviewed and are negative.  Past Medical History:  Diagnosis Date   Acid reflux    Acute colitis 09/16/2019   AKI (acute kidney injury) ([place]) 07/31/2012   Anemia    BENIGN PROSTATIC HYPERTROPHY, WITH OBSTRUCTION 12/18/2005    Qualifier: Diagnosis of  By: Oleta Mouse     Chest pain 07/31/2012   Colitis 09/16/2019   Colon cancer ([place])    per patient, colon cancer more than 10 years ago s/p right hemicolectomy   Elevated bilirubin    Elevated LFTs    Ex-cigarette smoker    Gout    Hyperlipemia    Hypertension    Hyponatremia 08/01/2021   Intractable abdominal pain 02/03/2021   LATERAL MENISCUS TEAR, RIGHT 04/19/2006   Qualifier: Diagnosis of  By: Jonna Munro MD, Cornelius     Leukocytosis, unspecified 07/31/2012   Paroxysmal atrial fibrillation ([place]) 2014   Prostate cancer ([place]) 08/08/2013   SEEDS 20 YEARS AGO   Prostate disease    RENAL CALCULUS 12/18/2005   Qualifier: Diagnosis of  By: Oleta Mouse     RUQ pain    UTI (urinary tract infection) 03/26/2021    Past Surgical History:  Procedure Laterality Date   BIOPSY  02/05/2021   Procedure: BIOPSY;  Surgeon: Harvel Quale, MD;  [facility];  Service: Gastroenterology;;   BIOPSY  08/02/2021   Procedure: BIOPSY;  Surgeon: Daneil Dolin, MD;  [facility]: [facility];  Service: Endoscopy;;   COLON SURGERY     Right hemicolectomy in the setting of colon cancer, per patient   CYSTOSCOPY WITH INSERTION OF UROLIFT N/A 05/19/2021   Procedure: CYSTOSCOPY WITH INSERTION OF UROLIFT;  Surgeon: Cleon Gustin, MD;  Location: [facility];  Service: Urology;  Laterality: N/A;   ESOPHAGOGASTRODUODENOSCOPY (EGD) WITH PROPOFOL  N/A 02/05/2021   Procedure: ESOPHAGOGASTRODUODENOSCOPY (EGD) WITH PROPOFOL;  Surgeon: Harvel Quale, MD;  [facility];  Service: Gastroenterology;  Laterality: N/A;   ESOPHAGOGASTRODUODENOSCOPY (EGD) WITH PROPOFOL N/A 08/02/2021   Procedure: ESOPHAGOGASTRODUODENOSCOPY (EGD) WITH PROPOFOL;  Surgeon: Daneil Dolin, MD;  [facility];  Service: Endoscopy;  Laterality: N/A;   HOT HEMOSTASIS  08/02/2021   Procedure: HOT HEMOSTASIS (ARGON PLASMA COAGULATION/BICAP);  Surgeon: Daneil Dolin, MD;   [facility];  Service: Endoscopy;;   LEFT HEART CATHETERIZATION WITH CORONARY ANGIOGRAM N/A 07/21/2012   Procedure: LEFT HEART CATHETERIZATION WITH CORONARY ANGIOGRAM;  Surgeon: Troy Sine, MD;  Location: [facility] CATH LAB;  Service: Cardiovascular;  Laterality: N/A;   PACEMAKER INSERTION     PERMANENT PACEMAKER INSERTION N/A 07/22/2012   Procedure: PERMANENT PACEMAKER INSERTION;  Surgeon: Sanda Klein, MD;  Location: [facility] CATH LAB;  Service: Cardiovascular;  Laterality: N/A;   PROSTATE SURGERY      Social History  reports that he quit smoking about 19 years ago. He has never used smokeless tobacco. He reports that he does not drink alcohol and does not use drugs.  No Known Allergies  Family History  Problem Relation Age of Onset   Cancer Neg Hx   Reviewed on admission  Prior to Admission medications   Medication Sig Start Date End Date Taking? Authorizing Provider  acetaminophen (TYLENOL) 650 MG CR tablet Take 1,300 mg by mouth every 8 (eight) hours as needed for pain.    [provider]  ascorbic acid (VITAMIN C) 250 MG tablet Take 250 mg by mouth 3 (three) times daily. Take with Iron (Ferrous Sulfate) to promote better absorption 03/18/21   [provider]  atorvastatin (LIPITOR) 40 MG tablet TAKE ONE-HALF TABLET BY MOUTH AT BEDTIME FOR CHOLESTEROL 08/13/21   [provider]  carvedilol (COREG) 3.125 MG tablet Take 1 tablet (3.125 mg total) by mouth 2 (two) times daily. 03/28/21 05/15/23  Antonieta Pert, MD  Cholecalciferol 25 MCG (1000 UT) tablet Take 1,000 Units by mouth daily.    [provider]  clotrimazole-betamethasone (LOTRISONE) cream Apply 1 application. topically 2 (two) times daily. 05/21/21   McKenzie, Candee Furbish, MD  Cyanocobalamin 1000 MCG CAPS Take 1 tablet by mouth daily. 11/03/13   [provider]  darolutamide (NUBEQA) 300 MG tablet Take 1 tablet (300 mg total) by mouth 2 (two) times daily with a meal. 10/06/21   Derek Jack, MD  diclofenac sodium (VOLTAREN) 1 % GEL Apply 2 g topically 4 (four) times daily. 06/19/17   Rancour, Annie Main, MD  Ensure (ENSURE) Take 237 mLs by mouth daily as needed (nutrition). Vanilla or chocolate    [provider]  ferrous sulfate 324 MG TBEC Take 324 mg by mouth 2 (two) times daily.    [provider]  fluticasone (FLONASE) 50 MCG/ACT nasal spray Place 2 sprays into both nostrils daily. Patient taking differently: Place 2 sprays into both nostrils daily as needed for allergies. 02/15/16   Ward, Delice Bison, DO  furosemide (LASIX) 40 MG tablet Take 40 mg by mouth daily. 03/18/21   [provider]  magnesium oxide (MAG-OX) 400 (240 Mg) MG tablet Take 1 tablet (400 mg total) by mouth 3 (three) times daily. 06/02/21   Derek Jack, MD  megestrol (MEGACE) 400 MG/10ML suspension Take 10 mLs (400 mg total) by mouth 2 (two) times daily. 03/20/21   Derek Jack, MD  pantoprazole (PROTONIX) 40 MG tablet TAKE 1 TABLET BY  MOUTH ONCE DAILY. Patient taking differently: Take 40 mg by mouth daily. 07/22/21   Derek Jack, MD  potassium chloride SA (KLOR-CON) 20 MEQ tablet Take 20 mEq by mouth daily. 02/28/20   [provider]  predniSONE (DELTASONE) 5 MG tablet Take 1 tablet (5 mg total) by mouth every morning. Continuous 08/26/21   Derek Jack, MD  rivaroxaban (XARELTO) 20 MG TABS tablet Take 1 tablet (20 mg total) by mouth daily with supper. 08/06/21   Manuella Ghazi, Pratik D, DO  tamsulosin (FLOMAX) 0.4 MG CAPS capsule Take 1 capsule (0.4 mg total) by mouth daily after supper. 05/07/21   McKenzie, Candee Furbish, MD  traMADol (ULTRAM) 50 MG tablet Take 1 tablet (50 mg total) by mouth every 12 (twelve) hours as needed. 09/17/21   Donella Stade, PA-C    Physical Exam: Vitals:   10/12/21 2130 10/12/21 2200 10/12/21 2300 10/13/21 0026  BP: (!) 141/86 (!) 146/78 137/75 (!) 155/63  Pulse: 66 69 72 67  Resp: 17 (!) '21 17 16  '$ Temp:   98 F (36.7 C)  98.2 F (36.8 C)  TempSrc:   Oral Oral  SpO2: 97% 97% 100% 97%    Physical Exam Constitutional:      General: He is not in acute distress.    Appearance: Normal appearance.  HENT:     Head: Normocephalic and atraumatic.     Mouth/Throat:     Mouth: Mucous membranes are moist.     Pharynx: Oropharynx is clear.  Eyes:     Extraocular Movements: Extraocular movements intact.     Pupils: Pupils are equal, round, and reactive to light.  Cardiovascular:     Rate and Rhythm: Normal rate. Rhythm irregular.     Pulses: Normal pulses.     Heart sounds: Murmur heard.  Pulmonary:     Effort: Pulmonary effort is normal. No respiratory distress.     Breath sounds: Normal breath sounds.  Abdominal:     General: Bowel sounds are normal. There is no distension.     Palpations: Abdomen is soft.     Tenderness: There is no abdominal tenderness.  Musculoskeletal:        General: No swelling or deformity.  Skin:    General: Skin is warm and dry.  Neurological:     General: No focal deficit present.     Mental Status: Mental status is at baseline.    Labs on Admission: I have personally reviewed following labs and imaging studies  CBC: Recent Labs  Lab 10/12/21 1422  WBC 5.9  NEUTROABS 4.2  HGB 11.1*  HCT 31.7*  MCV 96.9  PLT 017    Basic Metabolic Panel: Recent Labs  Lab 10/12/21 1422 10/12/21 1627  NA 132*  --   K 3.8  --   CL 100  --   CO2 22  --   GLUCOSE 107*  --   BUN 24*  --   CREATININE 1.28*  --   CALCIUM 9.1  --   MG  --  1.7    GFR: Estimated Creatinine Clearance: 40.7 mL/min (A) (by C-G formula based on SCr of 1.28 mg/dL (H)).  Liver Function Tests: Recent Labs  Lab 10/12/21 1422  AST 16  ALT 13  ALKPHOS 67  BILITOT 0.7  PROT 6.7  ALBUMIN 3.7    Urine analysis:    Component Value Date/Time   COLORURINE COLORLESS (A) 10/12/2021 2153   APPEARANCEUR CLEAR 10/12/2021 2153   APPEARANCEUR Clear 07/17/2021 0939  LABSPEC 1.009 10/12/2021 2153    PHURINE 5.5 10/12/2021 2153   GLUCOSEU NEGATIVE 10/12/2021 2153   HGBUR NEGATIVE 10/12/2021 2153   HGBUR negative 06/24/2007 0833   BILIRUBINUR NEGATIVE 10/12/2021 2153   BILIRUBINUR Negative 07/17/2021 0939   KETONESUR NEGATIVE 10/12/2021 2153   PROTEINUR NEGATIVE 10/12/2021 2153   UROBILINOGEN 1.0 07/29/2013 2032   NITRITE NEGATIVE 10/12/2021 2153   LEUKOCYTESUR NEGATIVE 10/12/2021 2153    Radiological Exams on Admission: CT Cervical Spine Wo Contrast  Result Date: 10/12/2021 CLINICAL DATA:  Neck trauma (Age >= 65y) EXAM: CT CERVICAL SPINE WITHOUT CONTRAST TECHNIQUE: Multidetector CT imaging of the cervical spine was performed without intravenous contrast. Multiplanar CT image reconstructions were also generated. RADIATION DOSE REDUCTION: This exam was performed according to the departmental dose-optimization program which includes automated exposure control, adjustment of the mA and/or kV according to patient size and/or use of iterative reconstruction technique. COMPARISON:  02/19/2021 FINDINGS: Alignment: Facet joints are aligned without dislocation or traumatic listhesis. Dens and lateral masses are aligned. Degenerative grade 1 anterolisthesis of C4 on C5 and C7 on T1. Skull base and vertebrae: No acute fracture. No primary bone lesion or focal pathologic process. Soft tissues and spinal canal: No prevertebral fluid or swelling. No visible canal hematoma. Disc levels: Severe degenerative disc disease of C5-6 and C6-7. Severe multilevel bilateral facet arthropathy. Upper chest: Included lung apices are clear. Other: Bilateral carotid atherosclerosis. IMPRESSION: 1. No acute fracture or traumatic listhesis of the cervical spine. 2. Severe multilevel cervical spondylosis. Electronically Signed   By: Davina Poke D.O.   On: 10/12/2021 14:36   CT Head Wo Contrast  Result Date: 10/12/2021 CLINICAL DATA:  Head trauma.  Fall. EXAM: CT HEAD WITHOUT CONTRAST TECHNIQUE: Contiguous axial images  were obtained from the base of the skull through the vertex without intravenous contrast. RADIATION DOSE REDUCTION: This exam was performed according to the departmental dose-optimization program which includes automated exposure control, adjustment of the mA and/or kV according to patient size and/or use of iterative reconstruction technique. COMPARISON:  Head CT 08/22/2021 FINDINGS: Brain: Stable cerebral atrophy. Diffuse low-density in the periventricular and subcortical white matter is again noted and suggestive for chronic changes. No evidence for acute hemorrhage, mass lesion, midline shift, hydrocephalus or large infarct. Vascular: No hyperdense vessel or unexpected calcification. Skull: Normal. Negative for fracture or focal lesion. Sinuses/Orbits: No acute finding. Other: None. IMPRESSION: 1. No acute intracranial abnormality. 2. Stable cerebral atrophy and evidence for chronic small vessel ischemic changes. Electronically Signed   By: Markus Daft M.D.   On: 10/12/2021 14:36   DG Shoulder Left  Result Date: 10/12/2021 CLINICAL DATA:  Left shoulder pain EXAM: LEFT SHOULDER - 2+ VIEW COMPARISON:  None Available. FINDINGS: There is no evidence of fracture or dislocation. There is no evidence of arthropathy or other focal bone abnormality. Soft tissues are unremarkable. IMPRESSION: Negative. Electronically Signed   By: Davina Poke D.O.   On: 10/12/2021 14:34   DG Chest 1 View  Result Date: 10/12/2021 CLINICAL DATA:  Syncope EXAM: CHEST  1 VIEW COMPARISON:  03/26/2021 FINDINGS: Left-sided implanted cardiac device remains in place. Heart size within normal limits. Aortic atherosclerosis. No focal airspace consolidation, pleural effusion, or pneumothorax. Subacute healing fracture of the distal right clavicle. IMPRESSION: 1. No acute cardiopulmonary findings. 2. Subacute healing fracture of the distal right clavicle. Electronically Signed   By: Davina Poke D.O.   On: 10/12/2021 14:30    EKG:  Independently reviewed.  Sinus rhythm at 64  bpm.  Evidence of LVH.  Similar to previous.  Repeat EKG showed paced rhythm at 72 bpm with wide QRS and then conversion back to sinus rhythm.  Assessment/Plan Principal Problem:   Syncope Active Problems:   Bradycardia   ANEMIA, IRON DEFICIENCY NOS   Essential hypertension   GERD   Pacemaker   Paroxysmal atrial fibrillation (HCC)   HTN (hypertension)   Prostate cancer (HCC)   CKD (chronic kidney disease), stage III (HCC)   Mixed hyperlipidemia   Syncope and collapse > Patient presenting after a fall at home where he was walking to the bathroom next he knew he was on the ground.  Unclear if there is true loss of consciousness but sounds like it. > Reports some mild disorientation when she found him but no clear postictal state no loss of bowel or bladder, no tongue biting. > Denies any prodrome prior to event but states he has had some lightheaded on standing recently.  > Cardiac history and lack of prodrome concerning for possible cardiogenic/arrhythmogenic etiology. > Patient received a liter of fluids in the ED. - Monitor on telemetry - Orthostatic vital signs - Echocardiogram - Supportive care  History of bradycardia Sepsis pacemaker History of first-degree AV block Atrial fibrillation > Has pacemaker in place.  Intermittently paced rhythm on EKGs. > Is on Xarelto for intermittent atrial fibrillation. - On telemetry as above - Continue home Xarelto - Continue home carvedilol  Hypertension Diastolic dysfunction > No diagnosis of CHF in the chart however patient does have echo last year with grade 1 diastolic dysfunction and EF 70-75% and is currently prescribed Lasix. - Continue home carvedilol, Lasix  Prostate cancer > Active prostate cancer with mets to adrenal gland.  Followed by oncology. - Continue home darolutamide - Continue as needed tramadol for pain - Continue home Megace for appetite  stimulation  Hyperlipidemia - Continue home atorvastatin  Anemia > Hemoglobin stable 11.1 - Trend CBC  GERD - Continue home PPI  CKD 3b > Creatinine stable at 1.28 - Trend renal function and electrolytes  DVT prophylaxis: Xarelto Code Status:   DNR Family Communication:  None on admission.  His daughter was with him at the med center and has been updated on his care plan. Disposition Plan:   Patient is from:  Home  Anticipated DC to:  Home  Anticipated DC date:  1 to 3 days  Anticipated DC barriers: None  Consults called:  None Admission status:  Observation, telemetry  Severity of Illness: The appropriate patient status for this patient is OBSERVATION. Observation status is judged to be reasonable and necessary in order to provide the required intensity of service to ensure the patient's safety. The patient's presenting symptoms, physical exam findings, and initial radiographic and laboratory data in the context of their medical condition is felt to place them at decreased risk for further clinical deterioration. Furthermore, it is anticipated that the patient will be medically stable for discharge from the hospital within 2 midnights of admission.    Marcelyn Bruins MD [facility]  How to contact the [facility] Attending or Consulting provider [place] or covering provider during after hours [place], for this patient?   Check the care team in [facility] and look for a) attending/consulting [facility] provider listed and b) the [facility] team listed Log into [email or web address] and use [place]'s universal password to access. If you do not have the password, please contact the hospital operator. Locate the [facility] provider you are looking for  under [facility] and page to a number that you can be directly reached. If you still have difficulty reaching the provider, please page the [facility] (Director on Call) for the Hospitalists listed on amion for assistance.  10/13/2021, 1:18 AM

## 2021-10-13 NOTE — Progress Notes (Signed)
Echocardiogram 2D Echocardiogram has been performed.  Derrick Long 10/13/2021, 8:51 AM

## 2021-10-13 NOTE — ED Notes (Signed)
[facility] arrived to transport pt. Pt stable at time of departure ?

## 2021-10-13 NOTE — Progress Notes (Signed)
Patient admitted after midnight, please see H&P.  HEre with a syncopal event-- orthostatic sounding as happened as he was getting out of bed-- but pacemaker has not been interrogated so no way to rule out cardiac involvement.  Keep on tele.  Orthostatics done in ER were after IVF-- will adjust BP Meds.   PT eval Eulogio Bear DO

## 2021-10-14 DIAGNOSIS — N1832 Chronic kidney disease, stage 3b: Secondary | ICD-10-CM | POA: Diagnosis not present

## 2021-10-14 DIAGNOSIS — I10 Essential (primary) hypertension: Secondary | ICD-10-CM | POA: Diagnosis not present

## 2021-10-14 DIAGNOSIS — R55 Syncope and collapse: Secondary | ICD-10-CM | POA: Diagnosis not present

## 2021-10-14 LAB — CORTISOL: Cortisol, Plasma: 0.9 ug/dL

## 2021-10-14 LAB — BASIC METABOLIC PANEL
Anion gap: 5 (ref 5–15)
BUN: 20 mg/dL (ref 8–23)
CO2: 22 mmol/L (ref 22–32)
Calcium: 8.9 mg/dL (ref 8.9–10.3)
Chloride: 108 mmol/L (ref 98–111)
Creatinine, Ser: 1.29 mg/dL — ABNORMAL HIGH (ref 0.61–1.24)
GFR, Estimated: 52 mL/min — ABNORMAL LOW (ref >60)
Glucose, Bld: 100 mg/dL — ABNORMAL HIGH (ref 70–99)
Potassium: 3.7 mmol/L (ref 3.5–5.1)
Sodium: 135 mmol/L (ref 135–145)

## 2021-10-14 LAB — GLUCOSE, CAPILLARY
Glucose-Capillary: 90 mg/dL (ref 70–99)
Glucose-Capillary: 108 mg/dL — ABNORMAL HIGH (ref 70–99)

## 2021-10-14 LAB — CBC
HCT: 31.6 % — ABNORMAL LOW (ref 39.0–52.0)
Hemoglobin: 10.7 g/dL — ABNORMAL LOW (ref 13.0–17.0)
MCH: 34.5 pg — ABNORMAL HIGH (ref 26.0–34.0)
MCHC: 33.9 g/dL (ref 30.0–36.0)
MCV: 101.9 fL — ABNORMAL HIGH (ref 80.0–100.0)
Platelets: 284 10*3/uL (ref 150–400)
RBC: 3.1 MIL/uL — ABNORMAL LOW (ref 4.22–5.81)
RDW: 12.5 % (ref 11.5–15.5)
WBC: 6.4 10*3/uL (ref 4.0–10.5)
nRBC: 0 % (ref 0.0–0.2)

## 2021-10-14 MED ORDER — SODIUM CHLORIDE 0.9 % IV SOLN: INTRAVENOUS | Status: DC

## 2021-10-14 NOTE — Evaluation (Signed)
Physical Therapy Evaluation Patient Details Name: Derrick Long MRN: [id number] DOB: 1926/07/03 Today's Date: 10/14/2021  History of Present Illness  88 y.o. male with medical history significant of bradycardia, first-degree AV block, paroxysmal A-fib, status post pacemaker, hypertension, prostate cancer, hyperlipidemia, anemia, GERD, gout, CKD 3, migraine, low back pain, insomnia, diastolic dysfunction presenting after a fall/syncope at home  Clinical Impression  Pt admitted with above diagnosis. Pt ambulated 200' with RW, no loss of balance. Pt denied dizziness/lightheadedness throughout PT session. See flowsheets for orthostatic vitals, he did have a 59 point systolic drop with sit to stand, but was asymptomatic. Encouraged pt to stand still for awhile prior to walking after sit to stand, to make sure he's not dizzy.  Pt currently with functional limitations due to the deficits listed below (see PT Problem List). Pt will benefit from skilled PT to increase their independence and safety with mobility to allow discharge to the venue listed below.          Recommendations for follow up therapy are one component of a multi-disciplinary discharge planning process, led by the attending physician.  Recommendations may be updated based on patient status, additional functional criteria and insurance authorization.  Follow Up Recommendations Home health PT      Assistance Recommended at Discharge Intermittent Supervision/Assistance  Patient can return home with the following  Assistance with cooking/housework;Direct supervision/assist for medications management;Assist for transportation    Equipment Recommendations None recommended by PT  Recommendations for Other Services       Functional Status Assessment Patient has had a recent decline in their functional status and demonstrates the ability to make significant improvements in function in a reasonable and predictable amount of time.      Precautions / Restrictions Precautions Precautions: Fall Precaution Comments: 3 falls in past 6 months (reports this happened with sit to stand) Restrictions Weight Bearing Restrictions: No      Mobility  Bed Mobility Overal bed mobility: Modified Independent             General bed mobility comments: HOB up, used rail    Transfers Overall transfer level: Needs assistance Equipment used: Rolling walker (2 wheels) Transfers: Sit to/from Stand Sit to Stand: From elevated surface, Supervision           General transfer comment: VCs hand placement, no physical assist needed    Ambulation/Gait Ambulation/Gait assistance: Supervision Gait Distance (Feet): 200 Feet Assistive device: Rolling walker (2 wheels) Gait Pattern/deviations: Step-through pattern, Decreased stride length, Trunk flexed Gait velocity: decr     General Gait Details: VCs for proximity to [employer]            Wheelchair Mobility    Modified Rankin (Stroke Patients Only)       Balance Overall balance assessment: History of Falls, Needs assistance   Sitting balance-Leahy Scale: Good     Standing balance support: Reliant on assistive device for balance Standing balance-Leahy Scale: Fair Standing balance comment: relies on RW for dynamic standing                             Pertinent Vitals/Pain Pain Assessment Pain Assessment: No/denies pain    Home Living Family/patient expects to be discharged to:: Private residence Living Arrangements: Children Available Help at Discharge: Family;Available 24 hours/day Type of Home: House Home Access: Stairs to enter   [employer] of Steps: 3   Home Layout: One level Home Equipment: Rolling  Walker (2 wheels);Cane - single point;Other (comment);BSC/3in1;Wheelchair - manual;Shower seat;Grab bars - toilet;Grab bars - tub/shower Additional Comments: lives with daughter and granddaughter    Prior Function Prior Level  of Function : Independent/Modified Independent             Mobility Comments: Using RW as needed; could ambulate in community ADLs Comments: Pt reports independence for ADL's and IADL's. Pt reports that he drives and does yard work.     Hand Dominance        Extremity/Trunk Assessment   Upper Extremity Assessment Upper Extremity Assessment: Overall WFL for tasks assessed    Lower Extremity Assessment Lower Extremity Assessment: Overall WFL for tasks assessed    Cervical / Trunk Assessment Cervical / Trunk Assessment: Normal  Communication   Communication: No difficulties  Cognition Arousal/Alertness: Awake/alert Behavior During Therapy: WFL for tasks assessed/performed Overall Cognitive Status: Within Functional Limits for tasks assessed                                          General Comments General comments (skin integrity, edema, etc.): see flowsheets for orthostatic vitals, 59 point systolic drop with sit to stand, but pt was asymptomatic, denied dizziness throughout PT session    Exercises     Assessment/Plan    PT Assessment Patient needs continued PT services  PT Problem List Cardiopulmonary status limiting activity;Decreased balance       PT Treatment Interventions Gait training;Therapeutic exercise;DME instruction;Balance training    PT Goals (Current goals can be found in the Care Plan section)  Acute Rehab PT Goals Patient Stated Goal: likes to garden, fish, hunt PT Goal Formulation: With patient/family Time For Goal Achievement: 10/28/21 Potential to Achieve Goals: Good    Frequency Min 3X/week     Co-evaluation               AM-PAC PT "6 Clicks" Mobility  Outcome Measure Help needed turning from your back to your side while in a flat bed without using bedrails?: None Help needed moving from lying on your back to sitting on the side of a flat bed without using bedrails?: A Little Help needed moving to and from a  bed to a chair (including a wheelchair)?: A Little Help needed standing up from a chair using your arms (e.g., wheelchair or bedside chair)?: A Little Help needed to walk in hospital room?: A Little Help needed climbing 3-5 steps with a railing? : A Little 6 Click Score: 19    End of Session Equipment Utilized During Treatment: Gait belt Activity Tolerance: Patient tolerated treatment well Patient left: in chair;with call bell/phone within reach;with family/visitor present Nurse Communication: Mobility status PT Visit Diagnosis: Difficulty in walking, not elsewhere classified (R26.2)    Time: 7622-6333 PT Time Calculation (min) (ACUTE ONLY): 35 min   Charges:   PT Evaluation $PT Eval Moderate Complexity: 1 Mod PT Treatments $Gait Training: 8-22 mins        Blondell Reveal Kistler PT 10/14/2021  Acute Rehabilitation Services  Office [phone]

## 2021-10-14 NOTE — Progress Notes (Signed)
Mobility Specialist - Progress Note   10/14/21 1407  Oxygen Therapy  SpO2 100 %  O2 Device Room Air  Mobility  Activity Ambulated with assistance in hallway  Level of Assistance Standby assist, set-up cues, supervision of patient - no hands on  Assistive Device Front wheel walker  Distance Ambulated (ft) 500 ft  Activity Response Tolerated well  $Mobility charge 1 Mobility   Pt received in bed and agreed to mobilize. No c/o pain nor discomfort. Pt left in bed with all needs met, call bell within reach with bed alarm on.  Roderick Pee Mobility Specialist

## 2021-10-14 NOTE — Progress Notes (Signed)
PROGRESS NOTE    [name]  [name]:[id number] DOB: 07-05-1926 DOA: 10/12/2021 PCP: [facility]    Brief Narrative:  Derrick Long is a 88 y.o. male with medical history significant of bradycardia, first-degree AV block, paroxysmal A-fib, status post pacemaker, hypertension, prostate cancer, hyperlipidemia, anemia, GERD, gout, CKD 3, migraine, low back pain, insomnia, diastolic dysfunction presenting after a fall/syncope at home.   Patient reports that he is walked to the bathroom earlier this morning and the next he knew he was on the ground.  Found to be orthostatic.     Assessment and Plan: Syncope and collapse > Denies any prodrome prior to event but states he has had some lightheaded on standing recently.  > Patient received a liter of fluids in the ED. - Orthostatic vital signs positive-- add TED hose and gentle IVF-- recheck in AM - Echocardiogram: Left ventricular ejection fraction, by estimation, is 65 to 70%. The  left ventricle has normal function. The left ventricle has no regional  wall motion abnormalities. There is moderate left ventricular hypertrophy.  Left ventricular diastolic  parameters are consistent with Grade I diastolic dysfunction (impaired relaxation).    History of bradycardia/ pacemaker History of first-degree AV block Atrial fibrillation > Has pacemaker in place.  Intermittently paced rhythm on EKGs. > Is on Xarelto for intermittent atrial fibrillation. -pacemaker interrogated and on chart-- does not appear that patient had any events noted on pacemaker around 7am on the 27th - Continue home Xarelto- does changed by pharmacy for age - Continue home carvedilol   Hypertension Diastolic dysfunction > No diagnosis of CHF in the chart however patient does have echo last year with grade 1 diastolic dysfunction and EF 70-75% and is currently prescribed Lasix. - Continue home carvedilol, Lasix held for now   Prostate cancer > Active  prostate cancer with mets to adrenal gland.  Followed by oncology. - Continue home darolutamide - Continue as needed tramadol for pain - Continue home Megace for appetite stimulation   Hyperlipidemia - Continue home atorvastatin   Anemia > Hemoglobin stable - Trend CBC   GERD - Continue home PPI   CKD 3b - Creatinine stable at 1.2 - Trend renal function and electrolytes   DVT prophylaxis: Place TED hose Start: 10/14/21 0741 Rivaroxaban (XARELTO) tablet 15 mg    Code Status: DNR Family Communication: daughter at bedside  Disposition Plan:  Level of care: Telemetry Status is: Observation     Consultants:  none   Subjective: No SOB, no CP Some dizziness when standing up -still sad from the loss of his wife 5 years ago-- she died in front of him  Objective: Vitals:   10/13/21 1626 10/13/21 2334 10/14/21 0609 10/14/21 0927  BP: 135/78 114/68 (!) 144/70   Pulse: 81 (!) 53 69   Resp:  18 18   Temp:  98.3 F (36.8 C) 98.1 F (36.7 C)   TempSrc:  Oral Oral   SpO2: 96% 93% 95% 100%  Weight:   81.1 kg   Height:        Intake/Output Summary (Last 24 hours) at 10/14/2021 1223 Last data filed at 10/14/2021 0830 Gross per 24 hour  Intake 269.64 ml  Output 1050 ml  Net -780.36 ml   Filed Weights   10/13/21 0500 10/13/21 0616 10/14/21 0609  Weight: 83.9 kg 82.8 kg 81.1 kg    Examination:   General: Appearance:    elderly male in no acute distress  Lungs:     respirations unlabored  Heart:    Normal heart rate.    MS:   All extremities are intact.    Neurologic:   Awake, alert       Data Reviewed: I have personally reviewed following labs and imaging studies  CBC: Recent Labs  Lab 10/12/21 1422 10/13/21 0516 10/14/21 0807  WBC 5.9 5.3 6.4  NEUTROABS 4.2  --   --   HGB 11.1* 10.9* 10.7*  HCT 31.7* 31.8* 31.6*  MCV 96.9 101.9* 101.9*  PLT 301 278 762   Basic Metabolic Panel: Recent Labs  Lab 10/12/21 1422 10/12/21 1627 10/13/21 0516  10/14/21 0807  NA 132*  --  137 135  K 3.8  --  3.4* 3.7  CL 100  --  107 108  CO2 22  --  24 22  GLUCOSE 107*  --  93 100*  BUN 24*  --  21 20  CREATININE 1.28*  --  1.20 1.29*  CALCIUM 9.1  --  9.1 8.9  MG  --  1.7  --   --    GFR: Estimated Creatinine Clearance: 40.4 mL/min (A) (by C-G formula based on SCr of 1.29 mg/dL (H)). Liver Function Tests: Recent Labs  Lab 10/12/21 1422  AST 16  ALT 13  ALKPHOS 67  BILITOT 0.7  PROT 6.7  ALBUMIN 3.7   No results for input(s): "LIPASE", "AMYLASE" in the last 168 hours. No results for input(s): "AMMONIA" in the last 168 hours. Coagulation Profile: Recent Labs  Lab 10/12/21 1422  INR 1.7*   Cardiac Enzymes: No results for input(s): "CKTOTAL", "CKMB", "CKMBINDEX", "TROPONINI" in the last 168 hours. BNP (last 3 results) No results for input(s): "PROBNP" in the last 8760 hours. HbA1C: No results for input(s): "HGBA1C" in the last 72 hours. CBG: Recent Labs  Lab 10/14/21 0612 10/14/21 0807  GLUCAP 90 108*   Lipid Profile: No results for input(s): "CHOL", "HDL", "LDLCALC", "TRIG", "CHOLHDL", "LDLDIRECT" in the last 72 hours. Thyroid Function Tests: No results for input(s): "TSH", "T4TOTAL", "FREET4", "T3FREE", "THYROIDAB" in the last 72 hours. Anemia Panel: No results for input(s): "VITAMINB12", "FOLATE", "FERRITIN", "TIBC", "IRON", "RETICCTPCT" in the last 72 hours. Sepsis Labs: No results for input(s): "PROCALCITON", "LATICACIDVEN" in the last 168 hours.  Recent Results (from the past 240 hour(s))  SARS Coronavirus 2 by RT PCR (hospital order, performed in [facility] hospital lab) *cepheid single result test* Anterior Nasal Swab     Status: None   Collection Time: 10/12/21  9:53 PM   Specimen: Anterior Nasal Swab  Result Value Ref Range Status   SARS Coronavirus 2 by RT PCR NEGATIVE NEGATIVE Final    Comment: (NOTE) SARS-CoV-2 target nucleic acids are NOT DETECTED.  The SARS-CoV-2 RNA is generally detectable in  upper and lower respiratory specimens during the acute phase of infection. The lowest concentration of SARS-CoV-2 viral copies this assay can detect is 250 copies / mL. A negative result does not preclude SARS-CoV-2 infection and should not be used as the sole basis for treatment or other patient management decisions.  A negative result may occur with improper specimen collection / handling, submission of specimen other than nasopharyngeal swab, presence of viral mutation(s) within the areas targeted by this assay, and inadequate number of viral copies (<250 copies / mL). A negative result must be combined with clinical observations, patient history, and epidemiological information.  Fact Sheet for Patients:   https://www.patel.info/  Fact Sheet for Healthcare Providers: https://hall.com/  This test is not yet approved or  cleared by the [place] and has been authorized for detection and/or diagnosis of SARS-CoV-2 by FDA under an Emergency Use Authorization (EUA).  This EUA will remain in effect (meaning this test can be used) for the duration of the COVID-19 declaration under Section 564(b)(1) of the Act, 21 U.S.C. section 360bbb-3(b)(1), unless the authorization is terminated or revoked sooner.  Performed at [facility], [address], [place], [place] [address]          Radiology Studies: ECHOCARDIOGRAM COMPLETE  Result Date: 10/13/2021    ECHOCARDIOGRAM REPORT   Patient Name:   MARIA GALLICCHIO Date of Exam: 10/13/2021 Medical Rec #:  [id number]         Height:       73.0 in Accession #:    [id number]        Weight:       182.5 lb Date of Birth:  05-Aug-1926          BSA:          2.070 m Patient Age:    12 years          BP:           144/65 mmHg Patient Gender: M                 HR:           66 bpm. Exam Location:  Outpatient Procedure: 2D Echo, Cardiac Doppler and Color Doppler Indications:    [id number]  Syncope  History:        Patient has prior history of Echocardiogram examinations, most                 recent 02/05/2021. Arrythmias:Atrial Fibrillation,                 Signs/Symptoms:Syncope; Risk Factors:Hypertension, Dyslipidemia                 and Former Smoker.  Sonographer:    Bernadene Person RDCS Referring Phys: [id number] [place]  1. Left ventricular ejection fraction, by estimation, is 65 to 70%. The left ventricle has normal function. The left ventricle has no regional wall motion abnormalities. There is moderate left ventricular hypertrophy. Left ventricular diastolic parameters are consistent with Grade I diastolic dysfunction (impaired relaxation).  2. Right ventricular systolic function is normal. The right ventricular size is normal.  3. Pacemaker lead seen in RA.  4. The mitral valve is grossly normal. Trivial mitral valve regurgitation.  5. The aortic valve is tricuspid. There is moderate calcification of the aortic valve. There is moderate thickening of the aortic valve. Aortic valve regurgitation is trivial. Mild aortic valve stenosis. Aortic valve area, by VTI measures 1.59 cm. Aortic valve mean gradient measures 12.5 mmHg. Aortic valve Vmax measures 2.34 m/s.  6. Aortic dilatation noted. There is borderline dilatation of the aortic root, measuring 37 mm.  7. The inferior vena cava is normal in size with greater than 50% respiratory variability, suggesting right atrial pressure of 3 mmHg. Comparison(s): Compared to prior TTE 01/2021, there is no significant change. AoV mean gradient is 12.20mHg on current study and was 947mg at prior study. FINDINGS  Left Ventricle: Left ventricular ejection fraction, by estimation, is 65 to 70%. The left ventricle has normal function. The left ventricle has no regional wall motion abnormalities. The left ventricular internal cavity size was normal in size. There is  moderate left ventricular  hypertrophy. Left ventricular diastolic  parameters are consistent with Grade I diastolic dysfunction (impaired relaxation). Right Ventricle: The right ventricular size is normal. No increase in right ventricular wall thickness. Right ventricular systolic function is normal. Left Atrium: Left atrial size was normal in size. Right Atrium: Pacemaker lead seen in RA. Right atrial size was normal in size. Pericardium: There is no evidence of pericardial effusion. Mitral Valve: The mitral valve is grossly normal. There is mild thickening of the mitral valve leaflet(s). There is mild calcification of the mitral valve leaflet(s). Mild to moderate mitral annular calcification. Trivial mitral valve regurgitation. Tricuspid Valve: The tricuspid valve is normal in structure. Tricuspid valve regurgitation is trivial. Aortic Valve: The aortic valve is tricuspid. There is moderate calcification of the aortic valve. There is moderate thickening of the aortic valve. Aortic valve regurgitation is trivial. Mild aortic stenosis is present. Aortic valve mean gradient measures 12.5 mmHg. Aortic valve peak gradient measures 21.9 mmHg. Aortic valve area, by VTI measures 1.59 cm. Pulmonic Valve: The pulmonic valve was normal in structure. Pulmonic valve regurgitation is trivial. Aorta: Aortic dilatation noted. There is borderline dilatation of the aortic root, measuring 37 mm. Venous: The inferior vena cava is normal in size with greater than 50% respiratory variability, suggesting right atrial pressure of 3 mmHg. IAS/Shunts: The atrial septum is grossly normal.  LEFT VENTRICLE PLAX 2D LVIDd:         4.50 cm      Diastology LVIDs:         2.60 cm      LV e' medial:    4.40 cm/s LV PW:         0.90 cm      LV E/e' medial:  15.8 LV IVS:        1.10 cm      LV e' lateral:   6.66 cm/s LVOT diam:     2.10 cm      LV E/e' lateral: 10.4 LV SV:         78 LV SV Index:   38 LVOT Area:     3.46 cm  LV Volumes (MOD) LV vol d, MOD A2C: 104.0 ml LV vol d, MOD A4C: 86.0 ml LV vol s, MOD A2C:  34.3 ml LV vol s, MOD A4C: 24.2 ml LV SV MOD A2C:     69.7 ml LV SV MOD A4C:     86.0 ml LV SV MOD BP:      69.3 ml RIGHT VENTRICLE RV S prime:     7.42 cm/s TAPSE (M-mode): 1.6 cm LEFT ATRIUM             Index        RIGHT ATRIUM           Index LA diam:        4.30 cm 2.08 cm/m   RA Area:     12.70 cm LA Vol (A2C):   48.9 ml 23.63 ml/m  RA Volume:   22.20 ml  10.73 ml/m LA Vol (A4C):   48.7 ml 23.53 ml/m LA Biplane Vol: 49.3 ml 23.82 ml/m  AORTIC VALVE AV Area (Vmax):    1.58 cm AV Area (Vmean):   1.52 cm AV Area (VTI):     1.59 cm AV Vmax:           234.00 cm/s AV Vmean:          164.500 cm/s AV VTI:  0.490 m AV Peak Grad:      21.9 mmHg AV Mean Grad:      12.5 mmHg LVOT Vmax:         107.00 cm/s LVOT Vmean:        72.300 cm/s LVOT VTI:          0.225 m LVOT/AV VTI ratio: 0.46  AORTA Ao Root diam: 3.70 cm Ao Asc diam:  3.30 cm MITRAL VALVE MV Area (PHT): 2.73 cm    SHUNTS MV Decel Time: 278 msec    Systemic VTI:  0.22 m MV E velocity: 69.40 cm/s  Systemic Diam: 2.10 cm MV A velocity: 99.90 cm/s MV E/A ratio:  0.69 Gwyndolyn Kaufman MD Electronically signed by Gwyndolyn Kaufman MD Signature Date/Time: 10/13/2021/10:34:25 AM    Final    CT Cervical Spine Wo Contrast  Result Date: 10/12/2021 CLINICAL DATA:  Neck trauma (Age >= 65y) EXAM: CT CERVICAL SPINE WITHOUT CONTRAST TECHNIQUE: Multidetector CT imaging of the cervical spine was performed without intravenous contrast. Multiplanar CT image reconstructions were also generated. RADIATION DOSE REDUCTION: This exam was performed according to the departmental dose-optimization program which includes automated exposure control, adjustment of the mA and/or kV according to patient size and/or use of iterative reconstruction technique. COMPARISON:  02/19/2021 FINDINGS: Alignment: Facet joints are aligned without dislocation or traumatic listhesis. Dens and lateral masses are aligned. Degenerative grade 1 anterolisthesis of C4 on C5 and C7 on T1.  Skull base and vertebrae: No acute fracture. No primary bone lesion or focal pathologic process. Soft tissues and spinal canal: No prevertebral fluid or swelling. No visible canal hematoma. Disc levels: Severe degenerative disc disease of C5-6 and C6-7. Severe multilevel bilateral facet arthropathy. Upper chest: Included lung apices are clear. Other: Bilateral carotid atherosclerosis. IMPRESSION: 1. No acute fracture or traumatic listhesis of the cervical spine. 2. Severe multilevel cervical spondylosis. Electronically Signed   By: Davina Poke D.O.   On: 10/12/2021 14:36   CT Head Wo Contrast  Result Date: 10/12/2021 CLINICAL DATA:  Head trauma.  Fall. EXAM: CT HEAD WITHOUT CONTRAST TECHNIQUE: Contiguous axial images were obtained from the base of the skull through the vertex without intravenous contrast. RADIATION DOSE REDUCTION: This exam was performed according to the departmental dose-optimization program which includes automated exposure control, adjustment of the mA and/or kV according to patient size and/or use of iterative reconstruction technique. COMPARISON:  Head CT 08/22/2021 FINDINGS: Brain: Stable cerebral atrophy. Diffuse low-density in the periventricular and subcortical white matter is again noted and suggestive for chronic changes. No evidence for acute hemorrhage, mass lesion, midline shift, hydrocephalus or large infarct. Vascular: No hyperdense vessel or unexpected calcification. Skull: Normal. Negative for fracture or focal lesion. Sinuses/Orbits: No acute finding. Other: None. IMPRESSION: 1. No acute intracranial abnormality. 2. Stable cerebral atrophy and evidence for chronic small vessel ischemic changes. Electronically Signed   By: Markus Daft M.D.   On: 10/12/2021 14:36   DG Shoulder Left  Result Date: 10/12/2021 CLINICAL DATA:  Left shoulder pain EXAM: LEFT SHOULDER - 2+ VIEW COMPARISON:  None Available. FINDINGS: There is no evidence of fracture or dislocation. There is no  evidence of arthropathy or other focal bone abnormality. Soft tissues are unremarkable. IMPRESSION: Negative. Electronically Signed   By: Davina Poke D.O.   On: 10/12/2021 14:34   DG Chest 1 View  Result Date: 10/12/2021 CLINICAL DATA:  Syncope EXAM: CHEST  1 VIEW COMPARISON:  03/26/2021 FINDINGS: Left-sided implanted cardiac device remains in place. Heart size  within normal limits. Aortic atherosclerosis. No focal airspace consolidation, pleural effusion, or pneumothorax. Subacute healing fracture of the distal right clavicle. IMPRESSION: 1. No acute cardiopulmonary findings. 2. Subacute healing fracture of the distal right clavicle. Electronically Signed   By: Davina Poke D.O.   On: 10/12/2021 14:30        Scheduled Meds:  atorvastatin  20 mg Oral Daily   carvedilol  3.125 mg Oral BID   darolutamide  300 mg Oral BID WC   diclofenac Sodium  2 g Topical QID   magnesium oxide  400 mg Oral TID   megestrol  400 mg Oral BID   pantoprazole  40 mg Oral Daily   predniSONE  5 mg Oral q morning   Rivaroxaban  15 mg Oral Q supper   sodium chloride flush  3 mL Intravenous Q12H   tamsulosin  0.4 mg Oral QPC supper   Continuous Infusions:  sodium chloride 50 mL/hr at 10/14/21 1017     LOS: 0 days    Time spent: 45 minutes spent on chart review, discussion with nursing staff, consultants, updating family and interview/physical exam; more than 50% of that time was spent in counseling and/or coordination of care.    Geradine Girt, DO [facility] Available via Epic secure chat 7am-7pm After these hours, please refer to coverage provider listed on [email or web address] 10/14/2021, 12:23 PM

## 2021-10-15 ENCOUNTER — Ambulatory Visit: Payer: Medicare Other | Admitting: Orthopedic Surgery

## 2021-10-15 DIAGNOSIS — R55 Syncope and collapse: Secondary | ICD-10-CM | POA: Diagnosis not present

## 2021-10-15 LAB — BASIC METABOLIC PANEL
Anion gap: 4 — ABNORMAL LOW (ref 5–15)
BUN: 20 mg/dL (ref 8–23)
CO2: 21 mmol/L — ABNORMAL LOW (ref 22–32)
Calcium: 8.5 mg/dL — ABNORMAL LOW (ref 8.9–10.3)
Chloride: 110 mmol/L (ref 98–111)
Creatinine, Ser: 1.23 mg/dL (ref 0.61–1.24)
GFR, Estimated: 55 mL/min — ABNORMAL LOW (ref >60)
Glucose, Bld: 90 mg/dL (ref 70–99)
Potassium: 4.3 mmol/L (ref 3.5–5.1)
Sodium: 135 mmol/L (ref 135–145)

## 2021-10-15 LAB — GLUCOSE, CAPILLARY: Glucose-Capillary: 93 mg/dL (ref 70–99)

## 2021-10-15 NOTE — TOC Initial Note (Signed)
Transition of Care [facility]) - Initial/Assessment Note    Patient Details  Name: Derrick Long MRN: [id number] Date of Birth: 1926/06/07  Transition of Care [facility]) CM/SW Contact:    Illene Regulus, LCSW Phone Number: 10/15/2021, 10:33 AM  Clinical Narrative:                 CSW has contacted the [facility] to speak with pt's SW about [facility] services. CSW spoke with pt who stated he has HHPT already set up. Pt is being discharged, [facility] not [facility] with [facility] as of yet.  Expected Discharge Plan: [place] Barriers to Discharge: No Barriers Identified   Patient Goals and CMS Choice Patient states their goals for this hospitalization and ongoing recovery are:: return home      Expected Discharge Plan and Services Expected Discharge Plan: [place]         Expected Discharge Date: 10/15/21                                    Prior Living Arrangements/Services   Lives with:: Spouse, Self Patient language and need for interpreter reviewed:: Yes Do you feel safe going back to the place where you live?: Yes      Need for Family Participation in Patient Care: No (Comment) Care giver support system in place?: Yes (comment) Current home services: DME, Home PT Criminal Activity/Legal Involvement Pertinent to Current Situation/Hospitalization: No - Comment as needed  Activities of Daily Living Home Assistive Devices/Equipment: Walker (specify type) ADL Screening (condition at time of admission) Patient's cognitive ability adequate to safely complete daily activities?: Yes Is the patient deaf or have difficulty hearing?: No Does the patient have difficulty seeing, even when wearing glasses/contacts?: No Does the patient have difficulty concentrating, remembering, or making decisions?: No Patient able to express need for assistance with ADLs?: Yes Does the patient have difficulty dressing or bathing?: No Independently performs ADLs?:  No Communication: Independent Dressing (OT): Independent Grooming: Independent Feeding: Independent Bathing: Needs assistance Is this a change from baseline?: Change from baseline, expected to last >3 days Toileting: Needs assistance Is this a change from baseline?: Change from baseline, expected to last >3days In/Out Bed: Needs assistance Is this a change from baseline?: Pre-admission baseline Walks in Home: Independent with device (comment) Does the patient have difficulty walking or climbing stairs?: No Weakness of Legs: Both Weakness of Arms/Hands: Left  Permission Sought/Granted   Permission granted to share information with : Yes, Verbal Permission Granted              Emotional Assessment Appearance:: Appears stated age Attitude/Demeanor/Rapport: Gracious Affect (typically observed): Accepting Orientation: : Oriented to Self, Oriented to Place, Oriented to  Time, Oriented to Situation   Psych Involvement: No (comment)  Admission diagnosis:  Syncope [R55] Patient Active Problem List   Diagnosis Date Noted   GI bleed 08/01/2021   Macrocytic anemia 08/01/2021   Mixed hyperlipidemia 08/01/2021   DNR (do not resuscitate) 03/26/2021   CKD (chronic kidney disease), stage III ([place]) 03/26/2021   Prostate cancer ([place]) 07/10/2020   Bilateral kidney masses 06/06/2020   Adrenal mass, right ([place]) 06/06/2020   Pelvic lymphadenopathy 09/16/2019   Paroxysmal atrial fibrillation ([place]) 09/02/2012   HTN (hypertension) 09/02/2012   Pacemaker 08/03/2012   Gout 07/31/2012   First degree AV block, PR interval 370 ms 07/19/2012   Chest pain, atypical  07/19/2012   Syncope 07/19/2012   Bradycardia 07/19/2012   ANEMIA, IRON DEFICIENCY NOS 08/16/2008   OSTEOARTHRITIS, MODERATE 09/21/2007   INSOMNIA 06/24/2007   DENTAL CARIES 10/08/2006   Migraine headache 12/18/2005   Essential hypertension 12/18/2005   GERD 12/18/2005   LOW BACK PAIN 12/18/2005   OSTEOPENIA 12/18/2005   COLON  CANCER, HX OF 12/18/2005   PCP:  [facility] [place]:   [place], [place] [address] Phone: [phone] Fax: [phone]  [place], [place] [address] Phone: [phone] Fax: [phone]     Social Determinants of Health (SDOH) Interventions    Readmission Risk Interventions    08/03/2021   11:19 AM  Readmission Risk Prevention Plan  Transportation Screening Complete  Medication Review (RN Care Manager) Complete  PCP or Specialist appointment within 3-5 days of discharge Complete  HRI or [place] Complete  SW Recovery Care/Counseling Consult Complete  [place] Not Applicable

## 2021-10-15 NOTE — Discharge Summary (Signed)
Physician Discharge Summary  Derrick Long VFI:[id number] DOB: 12-07-1926 DOA: 10/12/2021  PCP: [facility]  Admit date: 10/12/2021 Discharge date: 10/15/2021  Admitted From: Home Disposition: Home  Recommendations for Outpatient Follow-up:  Follow up with PCP in 1-2 weeks after discharge Schedule follow-up with a cardiologist to review her medications.  Home Health: PT/OT Equipment/Devices: None  Discharge Condition: Stable CODE STATUS: DNR Diet recommendation: Low-salt diet  Discharge summary: 88 year old gentleman with bradycardia status post pacemaker, hypertension, prostate cancer, GERD, paroxysmal A-fib on Xarelto presented with an episode of syncope and found to have orthostatic drop in blood pressure in the emergency room.  Admitted for observation. Positive for orthostatic vitals in the ER and symptom improved with compression stockings and gentle IV fluids. Pacemaker interrogation was normal. Echocardiogram with normal ejection fraction, grade 1 diastolic dysfunction. Patient on carvedilol, amlodipine and Lasix at home.  Euvolemic today.  Plan: Syncope secondary to orthostatic hypotension improved. Discontinue diuretics and amlodipine to allow room for orthostatic drop. Orthostatic precautions, compression stockings discussed and demonstrated.  Patient is wearing stockings now. All-time fall precautions. Resume all home medications including Xarelto, carvedilol, Flomax, atorvastatin.   Discharge Diagnoses:  Principal Problem:   Syncope Active Problems:   Bradycardia   ANEMIA, IRON DEFICIENCY NOS   Essential hypertension   GERD   Pacemaker   Paroxysmal atrial fibrillation (HCC)   HTN (hypertension)   Prostate cancer (HCC)   CKD (chronic kidney disease), stage III (HCC)   Mixed hyperlipidemia    Discharge Instructions  Discharge Instructions     Diet - low sodium heart healthy   Complete by: As directed    Discharge instructions    Complete by: As directed    All time fall precautions and postural precautions as discussed   Increase activity slowly   Complete by: As directed       Allergies as of 10/15/2021   No Known Allergies      Medication List     STOP taking these medications    amLODipine 10 MG tablet Commonly known as: NORVASC   furosemide 40 MG tablet Commonly known as: LASIX       TAKE these medications    acetaminophen 650 MG CR tablet Commonly known as: TYLENOL Take 1,300 mg by mouth every 8 (eight) hours as needed for pain.   ascorbic acid 250 MG tablet Commonly known as: VITAMIN C Take 250 mg by mouth 3 (three) times daily. Take with Iron (Ferrous Sulfate) to promote better absorption   atorvastatin 40 MG tablet Commonly known as: LIPITOR Take 20 mg by mouth daily.   carvedilol 3.125 MG tablet Commonly known as: COREG Take 3.125 mg by mouth 2 (two) times daily with a meal.   Cholecalciferol 25 MCG (1000 UT) tablet Take 1,000 Units by mouth daily.   clotrimazole-betamethasone cream Commonly known as: Lotrisone Apply 1 application. topically 2 (two) times daily.   Cyanocobalamin 1000 MCG Caps Take 1 tablet by mouth daily.   diclofenac sodium 1 % Gel Commonly known as: VOLTAREN Apply 2 g topically 4 (four) times daily.   Ensure Take 237 mLs by mouth daily as needed (nutrition). Vanilla or chocolate   ferrous sulfate 324 MG Tbec Take 324 mg by mouth 2 (two) times daily.   megestrol 400 MG/10ML suspension Commonly known as: MEGACE Take 10 mLs (400 mg total) by mouth 2 (two) times daily.   Nubeqa 300 MG tablet Generic drug: darolutamide Take 1 tablet (300 mg total) by mouth 2 (  two) times daily with a meal.   pantoprazole 40 MG tablet Commonly known as: PROTONIX TAKE 1 TABLET BY MOUTH ONCE DAILY.   predniSONE 5 MG tablet Commonly known as: DELTASONE Take 1 tablet (5 mg total) by mouth every morning. Continuous   rivaroxaban 20 MG Tabs tablet Commonly known  as: XARELTO Take 1 tablet (20 mg total) by mouth daily with supper.   tamsulosin 0.4 MG Caps capsule Commonly known as: FLOMAX Take 1 capsule (0.4 mg total) by mouth daily after supper.   traMADol 50 MG tablet Commonly known as: ULTRAM Take 1 tablet (50 mg total) by mouth every 12 (twelve) hours as needed. What changed: reasons to take this        No Known Allergies  Consultations: None   Procedures/Studies: ECHOCARDIOGRAM COMPLETE  Result Date: 10/13/2021    ECHOCARDIOGRAM REPORT   Patient Name:   MAYER VONDRAK Date of Exam: 10/13/2021 Medical Rec #:  [id number]         Height:       73.0 in Accession #:    [id number]        Weight:       182.5 lb Date of Birth:  [birth year]          BSA:          2.070 m Patient Age:    88 years          BP:           144/65 mmHg Patient Gender: M                 HR:           66 bpm. Exam Location:  Outpatient Procedure: 2D Echo, Cardiac Doppler and Color Doppler Indications:    R55 Syncope  History:        Patient has prior history of Echocardiogram examinations, most                 recent 02/05/2021. Arrythmias:Atrial Fibrillation,                 Signs/Symptoms:Syncope; Risk Factors:Hypertension, Dyslipidemia                 and Former Smoker.  Sonographer:    Bernadene Person RDCS Referring Phys: [id number] Makena  1. Left ventricular ejection fraction, by estimation, is 65 to 70%. The left ventricle has normal function. The left ventricle has no regional wall motion abnormalities. There is moderate left ventricular hypertrophy. Left ventricular diastolic parameters are consistent with Grade I diastolic dysfunction (impaired relaxation).  2. Right ventricular systolic function is normal. The right ventricular size is normal.  3. Pacemaker lead seen in RA.  4. The mitral valve is grossly normal. Trivial mitral valve regurgitation.  5. The aortic valve is tricuspid. There is moderate calcification of the aortic valve. There is  moderate thickening of the aortic valve. Aortic valve regurgitation is trivial. Mild aortic valve stenosis. Aortic valve area, by VTI measures 1.59 cm. Aortic valve mean gradient measures 12.5 mmHg. Aortic valve Vmax measures 2.34 m/s.  6. Aortic dilatation noted. There is borderline dilatation of the aortic root, measuring 37 mm.  7. The inferior vena cava is normal in size with greater than 50% respiratory variability, suggesting right atrial pressure of 3 mmHg. Comparison(s): Compared to prior TTE 01/2021, there is no significant change. AoV mean gradient is 12.73mHg on current study and was 925mg at prior study. FINDINGS  Left Ventricle:  Left ventricular ejection fraction, by estimation, is 65 to 70%. The left ventricle has normal function. The left ventricle has no regional wall motion abnormalities. The left ventricular internal cavity size was normal in size. There is  moderate left ventricular hypertrophy. Left ventricular diastolic parameters are consistent with Grade I diastolic dysfunction (impaired relaxation). Right Ventricle: The right ventricular size is normal. No increase in right ventricular wall thickness. Right ventricular systolic function is normal. Left Atrium: Left atrial size was normal in size. Right Atrium: Pacemaker lead seen in RA. Right atrial size was normal in size. Pericardium: There is no evidence of pericardial effusion. Mitral Valve: The mitral valve is grossly normal. There is mild thickening of the mitral valve leaflet(s). There is mild calcification of the mitral valve leaflet(s). Mild to moderate mitral annular calcification. Trivial mitral valve regurgitation. Tricuspid Valve: The tricuspid valve is normal in structure. Tricuspid valve regurgitation is trivial. Aortic Valve: The aortic valve is tricuspid. There is moderate calcification of the aortic valve. There is moderate thickening of the aortic valve. Aortic valve regurgitation is trivial. Mild aortic stenosis is  present. Aortic valve mean gradient measures 12.5 mmHg. Aortic valve peak gradient measures 21.9 mmHg. Aortic valve area, by VTI measures 1.59 cm. Pulmonic Valve: The pulmonic valve was normal in structure. Pulmonic valve regurgitation is trivial. Aorta: Aortic dilatation noted. There is borderline dilatation of the aortic root, measuring 37 mm. Venous: The inferior vena cava is normal in size with greater than 50% respiratory variability, suggesting right atrial pressure of 3 mmHg. IAS/Shunts: The atrial septum is grossly normal.  LEFT VENTRICLE PLAX 2D LVIDd:         4.50 cm      Diastology LVIDs:         2.60 cm      LV e' medial:    4.40 cm/s LV PW:         0.90 cm      LV E/e' medial:  15.8 LV IVS:        1.10 cm      LV e' lateral:   6.66 cm/s LVOT diam:     2.10 cm      LV E/e' lateral: 10.4 LV SV:         78 LV SV Index:   38 LVOT Area:     3.46 cm  LV Volumes (MOD) LV vol d, MOD A2C: 104.0 ml LV vol d, MOD A4C: 86.0 ml LV vol s, MOD A2C: 34.3 ml LV vol s, MOD A4C: 24.2 ml LV SV MOD A2C:     69.7 ml LV SV MOD A4C:     86.0 ml LV SV MOD BP:      69.3 ml RIGHT VENTRICLE RV S prime:     7.42 cm/s TAPSE (M-mode): 1.6 cm LEFT ATRIUM             Index        RIGHT ATRIUM           Index LA diam:        4.30 cm 2.08 cm/m   RA Area:     12.70 cm LA Vol (A2C):   48.9 ml 23.63 ml/m  RA Volume:   22.20 ml  10.73 ml/m LA Vol (A4C):   48.7 ml 23.53 ml/m LA Biplane Vol: 49.3 ml 23.82 ml/m  AORTIC VALVE AV Area (Vmax):    1.58 cm AV Area (Vmean):   1.52 cm AV Area (VTI):  1.59 cm AV Vmax:           234.00 cm/s AV Vmean:          164.500 cm/s AV VTI:            0.490 m AV Peak Grad:      21.9 mmHg AV Mean Grad:      12.5 mmHg LVOT Vmax:         107.00 cm/s LVOT Vmean:        72.300 cm/s LVOT VTI:          0.225 m LVOT/AV VTI ratio: 0.46  AORTA Ao Root diam: 3.70 cm Ao Asc diam:  3.30 cm MITRAL VALVE MV Area (PHT): 2.73 cm    SHUNTS MV Decel Time: 278 msec    Systemic VTI:  0.22 m MV E velocity: 69.40 cm/s   Systemic Diam: 2.10 cm MV A velocity: 99.90 cm/s MV E/A ratio:  0.69 Gwyndolyn Kaufman MD Electronically signed by Gwyndolyn Kaufman MD Signature Date/Time: 10/13/2021/10:34:25 AM    Final    CT Cervical Spine Wo Contrast  Result Date: 10/12/2021 CLINICAL DATA:  Neck trauma (Age >= 65y) EXAM: CT CERVICAL SPINE WITHOUT CONTRAST TECHNIQUE: Multidetector CT imaging of the cervical spine was performed without intravenous contrast. Multiplanar CT image reconstructions were also generated. RADIATION DOSE REDUCTION: This exam was performed according to the departmental dose-optimization program which includes automated exposure control, adjustment of the mA and/or kV according to patient size and/or use of iterative reconstruction technique. COMPARISON:  02/19/2021 FINDINGS: Alignment: Facet joints are aligned without dislocation or traumatic listhesis. Dens and lateral masses are aligned. Degenerative grade 1 anterolisthesis of C4 on C5 and C7 on T1. Skull base and vertebrae: No acute fracture. No primary bone lesion or focal pathologic process. Soft tissues and spinal canal: No prevertebral fluid or swelling. No visible canal hematoma. Disc levels: Severe degenerative disc disease of C5-6 and C6-7. Severe multilevel bilateral facet arthropathy. Upper chest: Included lung apices are clear. Other: Bilateral carotid atherosclerosis. IMPRESSION: 1. No acute fracture or traumatic listhesis of the cervical spine. 2. Severe multilevel cervical spondylosis. Electronically Signed   By: Davina Poke D.O.   On: 10/12/2021 14:36   CT Head Wo Contrast  Result Date: 10/12/2021 CLINICAL DATA:  Head trauma.  Fall. EXAM: CT HEAD WITHOUT CONTRAST TECHNIQUE: Contiguous axial images were obtained from the base of the skull through the vertex without intravenous contrast. RADIATION DOSE REDUCTION: This exam was performed according to the departmental dose-optimization program which includes automated exposure control, adjustment  of the mA and/or kV according to patient size and/or use of iterative reconstruction technique. COMPARISON:  Head CT 08/22/2021 FINDINGS: Brain: Stable cerebral atrophy. Diffuse low-density in the periventricular and subcortical white matter is again noted and suggestive for chronic changes. No evidence for acute hemorrhage, mass lesion, midline shift, hydrocephalus or large infarct. Vascular: No hyperdense vessel or unexpected calcification. Skull: Normal. Negative for fracture or focal lesion. Sinuses/Orbits: No acute finding. Other: None. IMPRESSION: 1. No acute intracranial abnormality. 2. Stable cerebral atrophy and evidence for chronic small vessel ischemic changes. Electronically Signed   By: Markus Daft M.D.   On: 10/12/2021 14:36   DG Shoulder Left  Result Date: 10/12/2021 CLINICAL DATA:  Left shoulder pain EXAM: LEFT SHOULDER - 2+ VIEW COMPARISON:  None Available. FINDINGS: There is no evidence of fracture or dislocation. There is no evidence of arthropathy or other focal bone abnormality. Soft tissues are unremarkable. IMPRESSION: Negative. Electronically Signed  By: Davina Poke D.O.   On: 10/12/2021 14:34   DG Chest 1 View  Result Date: 10/12/2021 CLINICAL DATA:  Syncope EXAM: CHEST  1 VIEW COMPARISON:  03/26/2021 FINDINGS: Left-sided implanted cardiac device remains in place. Heart size within normal limits. Aortic atherosclerosis. No focal airspace consolidation, pleural effusion, or pneumothorax. Subacute healing fracture of the distal right clavicle. IMPRESSION: 1. No acute cardiopulmonary findings. 2. Subacute healing fracture of the distal right clavicle. Electronically Signed   By: Davina Poke D.O.   On: 10/12/2021 14:30   DG Bone Density  Result Date: 10/01/2021 EXAM: DUAL X-RAY ABSORPTIOMETRY (DXA) FOR BONE MINERAL DENSITY IMPRESSION: Your patient Jakoby Melendrez completed a BMD test on 10/01/2021 using the [place] (software version: 14.10) manufactured by  [employer]. The following summarizes the results of our evaluation. Technologist: [facility] PATIENT BIOGRAPHICAL: Name: Derrick Long, Derrick Long Patient ID: [id number] Birth Date: 27-Sep-1926 Height: 73.0 in. Gender: Male Exam Date: 10/01/2021 Weight: 182.0 lbs. Indications: Advanced Age, Follow up Osteopenia, Height Loss, Prostate Cancer, Vitamin D Deficiency, Secondary Osteoporosis Fractures: Treatments: Asprin, Calcium, Vitamin D DENSITOMETRY RESULTS: Site          Region     Measured Date Measured Age WHO Classification Young Adult T-score BMD         %Change vs. Previous Significant Change (*) DualFemur Neck Left 10/01/2023 93.3 N/A -1.1 0.933 g/cm2 -3.5% - DualFemur Neck Left 11/06/2011 83.4 N/A -0.8 0.967 g/cm2 6.1% Yes DualFemur Neck Left 10/07/2005 77.3 N/A -1.2 0.911 g/cm2 - - DualFemur Total Mean 10/01/2021 93.3 N/A -0.2 1.073 g/cm2 -2.3% Yes DualFemur Total Mean 11/06/2011 83.4 N/A 0.0 1.098 g/cm2 4.8% Yes DualFemur Total Mean 10/07/2005 77.3 N/A -0.4 1.048 g/cm2 - - Right Forearm Radius 33% 10/01/2021 93.3 N/A -1.7 0.672 g/cm2 -20.4% Yes Right Forearm Radius 33% 11/06/2011 83.4 N/A 0.5 0.844 g/cm2 -4.7% - Right Forearm Radius 33% 10/07/2005 77.3 N/A 1.0 0.885 g/cm2 - - ASSESSMENT: BMD as determined from Forearm Radius 33% is 0.672 g/cm2 with a T-score of -1.7. This patient is considered osteopenic by World Healh Organization (WHO) Criteria. Compared with the prior study on 11/06/11, the BMD of the total mean and rt. forearm show a statistically significant decrease. Lumbar spine was excluded due to advanced degenerative changes. Patient is not a candidate for FRAX assessment due to age limit. World Pharmacologist [facility]) criteria for post-menopausal, Caucasian Women: Normal:       T-score at or above -1 SD Osteopenia:   T-score between -1 and -2.5 SD Osteoporosis: T-score at or below -2.5 SD RECOMMENDATIONS: 1. All patients should optimize calcium and vitamin D intake. 2. Consider FDA-approved medical  therapies in postmenopausal women and med aged 106 years and older, based on the following: a. A hip or vertebral (clinical or morphometric) fracture b. T-score< -2.5 at the femoral neck or spine after appropriate evaluation to exclude secondary causes c. Low bone mass (T-score between -1.0 and -2.5 at the femoral neck or spine) and a 10-year probability of a hip fracture > 3% or a 10-year probability of a major osteoporosis-related fracture > 20% based on the [place]-adapted WHO algorithm d. Clinician judgment and/or patient preferences may indicate treatment for people with 10-year fracture probabilities above or below these levels FOLLOW-UP: People with diagnosed cases of osteoporosis or osteopenia should be regularly tested for bone mineral density. For patients eligible for Medicare, routine testing is allowed once every 2 years. Testing frequency can be increased for patients who have rapidly progressing  disease, or for those who are receiving medical therapy to restore bone mass. I have reviewed this report, and agree with the above findings. [facility] Electronically Signed   By: Franki Cabot M.D.   On: 10/01/2021 11:18   XR Clavicle Right  Result Date: 09/17/2021 AP and axial views of right clavicle reviewed.  Lateral clavicle fracture again noted with no significant displacement compared with prior radiographs.  Callus formation noted around the clavicle fracture.  (Echo, Carotid, EGD, Colonoscopy, ERCP)    Subjective: Patient was seen and examined.  Daughter was at the bedside.  Denies any complaints.  He walked around without getting dizzy episodes.   Discharge Exam: Vitals:   10/15/21 0424 10/15/21 0813  BP: (!) 161/68   Pulse: (!) 59   Resp: 14   Temp: 98 F (36.7 C)   SpO2: 97% 99%   Vitals:   10/14/21 1407 10/14/21 2216 10/15/21 0424 10/15/21 0813  BP:  (!) 156/73 (!) 161/68   Pulse:  67 (!) 59   Resp:  14 14   Temp:  98.3 F (36.8 C) 98 F (36.7 C)   TempSrc:   Oral Oral   SpO2: 100% 98% 97% 99%  Weight:   82.4 kg   Height:        General: Pt is alert, awake, not in acute distress Younger than his stated age. Cardiovascular: RRR, S1/S2 +, no rubs, no gallops, pacemaker in place. Respiratory: CTA bilaterally, no wheezing, no rhonchi Abdominal: Soft, NT, ND, bowel sounds + Extremities: no edema, no cyanosis, compression stockings are in place.    The results of significant diagnostics from this hospitalization (including imaging, microbiology, ancillary and laboratory) are listed below for reference.     Microbiology: Recent Results (from the past 240 hour(s))  SARS Coronavirus 2 by RT PCR (hospital order, performed in [facility] hospital lab) *cepheid single result test* Anterior Nasal Swab     Status: None   Collection Time: 10/12/21  9:53 PM   Specimen: Anterior Nasal Swab  Result Value Ref Range Status   SARS Coronavirus 2 by RT PCR NEGATIVE NEGATIVE Final    Comment: (NOTE) SARS-CoV-2 target nucleic acids are NOT DETECTED.  The SARS-CoV-2 RNA is generally detectable in upper and lower respiratory specimens during the acute phase of infection. The lowest concentration of SARS-CoV-2 viral copies this assay can detect is 250 copies / mL. A negative result does not preclude SARS-CoV-2 infection and should not be used as the sole basis for treatment or other patient management decisions.  A negative result may occur with improper specimen collection / handling, submission of specimen other than nasopharyngeal swab, presence of viral mutation(s) within the areas targeted by this assay, and inadequate number of viral copies (<250 copies / mL). A negative result must be combined with clinical observations, patient history, and epidemiological information.  Fact Sheet for Patients:   https://www.patel.info/  Fact Sheet for Healthcare Providers: https://hall.com/  This test is not yet  approved or  cleared by the [place] FDA and has been authorized for detection and/or diagnosis of SARS-CoV-2 by FDA under an Emergency Use Authorization (EUA).  This EUA will remain in effect (meaning this test can be used) for the duration of the COVID-19 declaration under Section 564(b)(1) of the Act, 21 U.S.C. section 360bbb-3(b)(1), unless the authorization is terminated or revoked sooner.  Performed at [facility], [address], [place], [place] [address]      Labs: BNP (last 3  results) No results for input(s): "BNP" in the last 8760 hours. Basic Metabolic Panel: Recent Labs  Lab 10/12/21 1422 10/12/21 1627 10/13/21 0516 10/14/21 0807 10/15/21 0557  NA 132*  --  137 135 135  K 3.8  --  3.4* 3.7 4.3  CL 100  --  107 108 110  CO2 22  --  24 22 21*  GLUCOSE 107*  --  93 100* 90  BUN 24*  --  '21 20 20  '$ CREATININE 1.28*  --  1.20 1.29* 1.23  CALCIUM 9.1  --  9.1 8.9 8.5*  MG  --  1.7  --   --   --    Liver Function Tests: Recent Labs  Lab 10/12/21 1422  AST 16  ALT 13  ALKPHOS 67  BILITOT 0.7  PROT 6.7  ALBUMIN 3.7   No results for input(s): "LIPASE", "AMYLASE" in the last 168 hours. No results for input(s): "AMMONIA" in the last 168 hours. CBC: Recent Labs  Lab 10/12/21 1422 10/13/21 0516 10/14/21 0807  WBC 5.9 5.3 6.4  NEUTROABS 4.2  --   --   HGB 11.1* 10.9* 10.7*  HCT 31.7* 31.8* 31.6*  MCV 96.9 101.9* 101.9*  PLT 301 278 284   Cardiac Enzymes: No results for input(s): "CKTOTAL", "CKMB", "CKMBINDEX", "TROPONINI" in the last 168 hours. BNP: Invalid input(s): "POCBNP" CBG: Recent Labs  Lab 10/14/21 0612 10/14/21 0807 10/15/21 0428  GLUCAP 90 108* 93   D-Dimer No results for input(s): "DDIMER" in the last 72 hours. Hgb A1c No results for input(s): "HGBA1C" in the last 72 hours. Lipid Profile No results for input(s): "CHOL", "HDL", "LDLCALC", "TRIG", "CHOLHDL", "LDLDIRECT" in the last 72 hours. Thyroid function  studies No results for input(s): "TSH", "T4TOTAL", "T3FREE", "THYROIDAB" in the last 72 hours.  Invalid input(s): "FREET3" Anemia work up No results for input(s): "VITAMINB12", "FOLATE", "FERRITIN", "TIBC", "IRON", "RETICCTPCT" in the last 72 hours. Urinalysis    Component Value Date/Time   COLORURINE COLORLESS (A) 10/12/2021 2153   APPEARANCEUR CLEAR 10/12/2021 2153   APPEARANCEUR Clear 07/17/2021 0939   LABSPEC 1.009 10/12/2021 2153   PHURINE 5.5 10/12/2021 2153   GLUCOSEU NEGATIVE 10/12/2021 2153   HGBUR NEGATIVE 10/12/2021 2153   HGBUR negative 06/24/2007 0833   BILIRUBINUR NEGATIVE 10/12/2021 2153   BILIRUBINUR Negative 07/17/2021 0939   KETONESUR NEGATIVE 10/12/2021 2153   PROTEINUR NEGATIVE 10/12/2021 2153   UROBILINOGEN 1.0 07/29/2013 2032   NITRITE NEGATIVE 10/12/2021 2153   LEUKOCYTESUR NEGATIVE 10/12/2021 2153   Sepsis Labs Recent Labs  Lab 10/12/21 1422 10/13/21 0516 10/14/21 0807  WBC 5.9 5.3 6.4   Microbiology Recent Results (from the past 240 hour(s))  SARS Coronavirus 2 by RT PCR (hospital order, performed in [facility] hospital lab) *cepheid single result test* Anterior Nasal Swab     Status: None   Collection Time: 10/12/21  9:53 PM   Specimen: Anterior Nasal Swab  Result Value Ref Range Status   SARS Coronavirus 2 by RT PCR NEGATIVE NEGATIVE Final    Comment: (NOTE) SARS-CoV-2 target nucleic acids are NOT DETECTED.  The SARS-CoV-2 RNA is generally detectable in upper and lower respiratory specimens during the acute phase of infection. The lowest concentration of SARS-CoV-2 viral copies this assay can detect is 250 copies / mL. A negative result does not preclude SARS-CoV-2 infection and should not be used as the sole basis for treatment or other patient management decisions.  A negative result may occur with improper specimen collection / handling, submission  of specimen other than nasopharyngeal swab, presence of viral mutation(s) within  the areas targeted by this assay, and inadequate number of viral copies (<250 copies / mL). A negative result must be combined with clinical observations, patient history, and epidemiological information.  Fact Sheet for Patients:   https://www.patel.info/  Fact Sheet for Healthcare Providers: https://hall.com/  This test is not yet approved or  cleared by the [place] FDA and has been authorized for detection and/or diagnosis of SARS-CoV-2 by FDA under an Emergency Use Authorization (EUA).  This EUA will remain in effect (meaning this test can be used) for the duration of the COVID-19 declaration under Section 564(b)(1) of the Act, 21 U.S.C. section 360bbb-3(b)(1), unless the authorization is terminated or revoked sooner.  Performed at [facility], [address] [place], [place] [address]      Time coordinating discharge: 35 minutes  SIGNED:   Barb Merino, MD  [facility] 10/15/2021, 9:23 AM

## 2021-10-15 NOTE — TOC Transition Note (Signed)
Transition of Care [facility]) - CM/SW Discharge Note   Patient Details  Name: Derrick Long MRN: [id number] Date of Birth: 09/18/1926  Transition of Care [facility]) CM/SW Contact:  Illene Regulus, LCSW Phone Number: 10/15/2021, 11:42 AM   Clinical Narrative:     CSW spoke with pt's PCP office and was provided with the contact information for pt's [facility] services. Pt is set up with [facility] PT, OT, and SW through [place]. Pt's PCP is requesting new [place] orders to be faxed out to [phone]. Pt reported no DME needs. No additional TOC needs TOC will sign off.   Final next level of care: [place] Barriers to Discharge: No Barriers Identified   Patient Goals and CMS Choice Patient states their goals for this hospitalization and ongoing recovery are:: return home      Discharge Placement                       Discharge Plan and Services                                     Social Determinants of Health (SDOH) Interventions     Readmission Risk Interventions    08/03/2021   11:19 AM  Readmission Risk Prevention Plan  Transportation Screening Complete  Medication Review ([place]) Complete  PCP or Specialist appointment within 3-5 days of discharge Complete  HRI or [place] Complete  SW Recovery Care/Counseling Consult Complete  [place] Not Applicable

## 2021-10-15 NOTE — Progress Notes (Signed)
Patient is being discharged home. All discharge instructions reviewed including new medications. Patient and daughter at bedside verbalized full understanding. Patients daughter is patients ride home.

## 2021-10-16 ENCOUNTER — Encounter (INDEPENDENT_AMBULATORY_CARE_PROVIDER_SITE_OTHER): Payer: Self-pay | Admitting: Gastroenterology

## 2021-10-16 ENCOUNTER — Ambulatory Visit (INDEPENDENT_AMBULATORY_CARE_PROVIDER_SITE_OTHER): Payer: Medicare Other | Admitting: Gastroenterology

## 2021-10-16 ENCOUNTER — Encounter (INDEPENDENT_AMBULATORY_CARE_PROVIDER_SITE_OTHER): Payer: Self-pay

## 2021-10-16 ENCOUNTER — Other Ambulatory Visit (INDEPENDENT_AMBULATORY_CARE_PROVIDER_SITE_OTHER): Payer: Self-pay

## 2021-10-16 VITALS — BP 159/72 | HR 64 | Temp 98.0°F | Ht 73.0 in | Wt 186.0 lb

## 2021-10-16 DIAGNOSIS — D500 Iron deficiency anemia secondary to blood loss (chronic): Secondary | ICD-10-CM

## 2021-10-16 DIAGNOSIS — K3182 Dieulafoy lesion (hemorrhagic) of stomach and duodenum: Secondary | ICD-10-CM | POA: Diagnosis not present

## 2021-10-16 DIAGNOSIS — K921 Melena: Secondary | ICD-10-CM | POA: Diagnosis not present

## 2021-10-16 MED ORDER — PANTOPRAZOLE SODIUM 40 MG PO TBEC: 40.0000 mg | DELAYED_RELEASE_TABLET | Freq: Every day | ORAL | 3 refills | Status: DC

## 2021-10-16 NOTE — Progress Notes (Signed)
Referring Provider: [facility] [place] Physician:  [place] Primary GI Physician: Jenetta Downer   Chief Complaint  Patient presents with   Hospitalization Follow-up    Patient arrives with daughter Kerry Hough for a hospital follow up. Reports he is still having dark stools.    HPI:   Derrick Long is a 88 y.o. male with past medical history of GERD, anemia, BPH, gout, HTN, Hyperlipemia, a fib on Xarelto, metastatic prostate cancer, colon cancer.   Patient presenting today for hospital follow up.  Admission in June 2023 with melena/FOBT positive. Presented to ED with 2 day history of black tarry stools, maintained on Xarelto. Also with some abdominal bloating and weakness. Hgb on admission 12.4  Noncontrast CT of the abdomen demonstrated bilateral bilateral enhancing renal masses suspicious for carcinoma.  Surgical changes consistent with right hemicolectomy with ileocolonic anastomosis.  Otherwise, nothing acute. (Interestingly, patient and family denied history of colon cancer at that time).   Also admitted in December 2022 for right scapular pain.  He was seen in GI consultation.  EGD by Dr. 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  No H. pylori on biopsy.  Patient underwent EGD on 08/02/21 as below, actively oozing dieulafoy lesion, treated with APC and clips  Advised to continue PPI daily, if bleeding reoccurs commended repeat EGD and perhaps a capsule study. Continued on PO Iron BID. Hgb at time of discharge was 11.8   Recurrent ED visit on 8/14 for dark stools, lightheadedness, FOBT negative at that time and hgb 11.4, therefore patient was d/c.   Last labs with hgb 10.7, BUN 20 on 10/14/21.   Present:  States that he had a recent fall and had an ED visit a few days. Daughter states that they took him off of lasix and amlodipine as they felt BP was  dropping too low which may have caused his fall. He is having intermittent lightheadedness with standing. Denies SOB. Denies abdominal pain, fatigue, dizziness or sob. Denies BRBPR. He does endorse that stools remain black and tarry and have been that way for the past month Appetite is good. Denies weight loss. Denies nausea or vomiting. He is continued on xarelto and iron pills twice daily.   Last Colonoscopy:several years ago at [facility]:08/02/21-Normal esophagus. Inflamed appearing gastric mucosa. 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  Recommendations:    Past Medical History:  Diagnosis Date   Acid reflux    Acute colitis 09/16/2019   AKI (acute kidney injury) ([place]) 07/31/2012   Anemia    BENIGN PROSTATIC HYPERTROPHY, WITH OBSTRUCTION 12/18/2005   Qualifier: Diagnosis of  By: Oleta Mouse     Chest pain 07/31/2012   Colitis 09/16/2019   Colon cancer ([place])    per patient, colon cancer more than 10 years ago s/p right hemicolectomy   Elevated bilirubin    Elevated LFTs    Ex-cigarette smoker    Gout    Hyperlipemia    Hypertension    Hyponatremia 08/01/2021   Intractable abdominal pain 02/03/2021   LATERAL MENISCUS TEAR, RIGHT 04/19/2006   Qualifier: Diagnosis of  By: Jonna Munro MD, Cornelius     Leukocytosis, unspecified 07/31/2012   Paroxysmal atrial fibrillation ([place]) 2014   Prostate cancer ([place]) 08/08/2013  SEEDS 20 YEARS AGO   Prostate disease    RENAL CALCULUS 12/18/2005   Qualifier: Diagnosis of  By: Oleta Mouse     RUQ pain    UTI (urinary tract infection) 03/26/2021    Past Surgical History:  Procedure Laterality Date   BIOPSY  02/05/2021   Procedure: BIOPSY;  Surgeon: Harvel Quale, MD;  [facility];  Service: Gastroenterology;;   BIOPSY  08/02/2021   Procedure:  BIOPSY;  Surgeon: Daneil Dolin, MD;  [facility];  Service: Endoscopy;;   COLON SURGERY     Right hemicolectomy in the setting of colon cancer, per patient   CYSTOSCOPY WITH INSERTION OF UROLIFT N/A 05/19/2021   Procedure: CYSTOSCOPY WITH INSERTION OF UROLIFT;  Surgeon: Cleon Gustin, MD;  Location: [facility] ORS;  Service: Urology;  Laterality: N/A;   ESOPHAGOGASTRODUODENOSCOPY (EGD) WITH PROPOFOL N/A 02/05/2021   Procedure: ESOPHAGOGASTRODUODENOSCOPY (EGD) WITH PROPOFOL;  Surgeon: Harvel Quale, MD;  [facility];  Service: Gastroenterology;  Laterality: N/A;   ESOPHAGOGASTRODUODENOSCOPY (EGD) WITH PROPOFOL N/A 08/02/2021   Procedure: ESOPHAGOGASTRODUODENOSCOPY (EGD) WITH PROPOFOL;  Surgeon: Daneil Dolin, MD;  [facility];  Service: Endoscopy;  Laterality: N/A;   HOT HEMOSTASIS  08/02/2021   Procedure: HOT HEMOSTASIS (ARGON PLASMA COAGULATION/BICAP);  Surgeon: Daneil Dolin, MD;  [facility];  Service: Endoscopy;;   LEFT HEART CATHETERIZATION WITH CORONARY ANGIOGRAM N/A 07/21/2012   Procedure: LEFT HEART CATHETERIZATION WITH CORONARY ANGIOGRAM;  Surgeon: Troy Sine, MD;  Location: [facility] CATH LAB;  Service: Cardiovascular;  Laterality: N/A;   PACEMAKER INSERTION     PERMANENT PACEMAKER INSERTION N/A 07/22/2012   Procedure: PERMANENT PACEMAKER INSERTION;  Surgeon: Sanda Klein, MD;  Location: [facility] CATH LAB;  Service: Cardiovascular;  Laterality: N/A;   PROSTATE SURGERY      Current Outpatient Medications  Medication Sig Dispense Refill   acetaminophen (TYLENOL) 650 MG CR tablet Take 1,300 mg by mouth every 8 (eight) hours as needed for pain.     ascorbic acid (VITAMIN C) 250 MG tablet Take 250 mg by mouth 3 (three) times daily. Take with Iron (Ferrous Sulfate) to promote better absorption     atorvastatin (LIPITOR) 40 MG tablet Take 20 mg by mouth daily.     carvedilol (COREG) 3.125 MG tablet Take 3.125 mg by mouth 2 (two) times  daily with a meal.     Cholecalciferol 25 MCG (1000 UT) tablet Take 1,000 Units by mouth daily.     clotrimazole-betamethasone (LOTRISONE) cream Apply 1 application. topically 2 (two) times daily. 30 g 0   Cyanocobalamin 1000 MCG CAPS Take 1 tablet by mouth daily.     darolutamide (NUBEQA) 300 MG tablet Take 1 tablet (300 mg total) by mouth 2 (two) times daily with a meal. 60 tablet 0   diclofenac sodium (VOLTAREN) 1 % GEL Apply 2 g topically 4 (four) times daily. 100 g 0   Ensure (ENSURE) Take 237 mLs by mouth daily as needed (nutrition). Vanilla or chocolate     ferrous sulfate 324 MG TBEC Take 324 mg by mouth 2 (two) times daily.     megestrol (MEGACE) 400 MG/10ML suspension Take 10 mLs (400 mg total) by mouth 2 (two) times daily. 480 mL 2   pantoprazole (PROTONIX) 40 MG tablet TAKE 1 TABLET BY MOUTH ONCE DAILY. (Patient taking differently: Take 40 mg by mouth daily.) 30 tablet 0   predniSONE (DELTASONE) 5 MG tablet Take 1 tablet (5 mg  total) by mouth every morning. Continuous 30 tablet 3   rivaroxaban (XARELTO) 20 MG TABS tablet Take 1 tablet (20 mg total) by mouth daily with supper. 20 tablet 0   tamsulosin (FLOMAX) 0.4 MG CAPS capsule Take 1 capsule (0.4 mg total) by mouth daily after supper. 30 capsule 11   traMADol (ULTRAM) 50 MG tablet Take 1 tablet (50 mg total) by mouth every 12 (twelve) hours as needed. (Patient taking differently: Take 50 mg by mouth every 12 (twelve) hours as needed for moderate pain or severe pain.) 20 tablet 0   No current facility-administered medications for this visit.   Facility-Administered Medications Ordered in Other Visits  Medication Dose Route Frequency Provider Last Rate Last Admin   octreotide (SANDOSTATIN LAR) 20 MG IM injection             Allergies as of 10/16/2021   (No Known Allergies)    Family History  Problem Relation Age of Onset   Cancer Neg Hx     Social History   Socioeconomic History   Marital status: Married    Spouse name:  Not on file   Number of children: Not on file   Years of education: Not on file   Highest education level: Not on file  Occupational History   Not on file  Tobacco Use   Smoking status: Former    Types: Cigarettes    Quit date: 07/20/2002    Years since quitting: 19.2   Smokeless tobacco: Never  Vaping Use   Vaping Use: Never used  Substance and Sexual Activity   Alcohol use: No   Drug use: No   Sexual activity: Not on file  Other Topics Concern   Not on file  Social History Narrative   Not on file   Social Determinants of Health   Financial Resource Strain: Low Risk  (05/16/2020)   Overall Financial Resource Strain (CARDIA)    Difficulty of Paying Living Expenses: Not hard at all  Food Insecurity: No Food Insecurity (05/16/2020)   Hunger Vital Sign    Worried About Running Out of Food in the Last Year: Never true    [place] in the Last Year: Never true  Transportation Needs: No Transportation Needs (05/16/2020)   PRAPARE - Hydrologist (Medical): No    Lack of Transportation (Non-Medical): No  Physical Activity: Insufficiently Active (05/16/2020)   Exercise Vital Sign    Days of Exercise per Week: 3 days    Minutes of Exercise per Session: 30 min  Stress: No Stress Concern Present (05/16/2020)   [place]    Feeling of Stress : Not at all  Social Connections: Moderately Integrated (05/16/2020)   Social Connection and Isolation Panel [NHANES]    Frequency of Communication with Friends and Family: More than three times a week    Frequency of Social Gatherings with Friends and Family: More than three times a week    Attends Religious Services: More than 4 times per year    Active Member of Clubs or Organizations: No    Attends Archivist Meetings: 1 to 4 times per year    Marital Status: Widowed   Review of systems General: negative for malaise, night sweats,  fever, chills, weight loss Neck: Negative for lumps, goiter, pain and significant neck swelling Resp: Negative for cough, wheezing, dyspnea at rest CV: Negative for chest pain, leg swelling, palpitations, orthopnea GI:  denies hematochezia, nausea, vomiting, diarrhea, constipation, dysphagia, odyonophagia, early satiety or unintentional weight loss. +black tarry stools  MSK: Negative for joint pain or swelling, back pain, and muscle pain. Derm: Negative for itching or rash Psych: Denies depression, anxiety, memory loss, confusion. No homicidal or suicidal ideation.  Heme: Negative for prolonged bleeding, bruising easily, and swollen nodes. Endocrine: Negative for cold or heat intolerance, polyuria, polydipsia and goiter. Neuro: negative for tremor, gait imbalance, syncope and seizures. The remainder of the review of systems is noncontributory.  Physical Exam: BP (!) 159/72 (BP Location: Right Arm, Patient Position: Sitting, Cuff Size: Large)   Pulse 64   Temp 98 F (36.7 C) (Oral)   Ht '6\' 1"'$  (1.854 m)   Wt 186 lb (84.4 kg)   BMI 24.54 kg/m  General:   Alert and oriented. No distress noted. Pleasant and cooperative.  Head:  Normocephalic and atraumatic. Eyes:  Conjuctiva clear without scleral icterus. Mouth:  Oral mucosa pink and moist. Good dentition. No lesions. Heart: Normal rate and rhythm, s1 and s2 heart sounds present.  Lungs: Clear lung sounds in all lobes. Respirations equal and unlabored. Abdomen:  +BS, soft, non-tender and non-distended. No rebound or guarding. No HSM or masses noted. Derm: No palmar erythema or jaundice Msk:  Symmetrical without gross deformities. Normal posture. Extremities:  Without edema. Neurologic:  Alert and  oriented x4 Psych:  Alert and cooperative. Normal mood and affect.  Invalid input(s): "6 MONTHS"   ASSESSMENT: PRIMUS GRITTON is a 88 y.o. male presenting today for hospital follow up.  Hospitalization in June with melena/anemia, EGD at  that time with bleeding dieulafoy lesion treated with APC and clips, patient maintained on PPI daily and Iron PO BID. Notes black tarry stools for the past month, seen in ED on 8/14 for same symptoms, discharged with negative FOBT and stable hgb of 11.4, however, hgb has continued to trend down since hospitalization in June and is now 10.7. he is maintained on xarelto for hx of A fib. Having some lightheadedness with standing. Recommend repeat endoscopic evaluation given patient's history and down-trending of his hemoglobin despite Iron supplementation. Case discussed with Dr. Jenetta Downer who recommended push enteroscopy.   Indications, risks and benefits of procedure discussed in detail with patient. Patient and daughter verbalized understanding and are in agreement to proceed with push enteroscopy at this time.    PLAN:  Push enteroscopy-asa III  2. Continue PPI daily  3. Continue Iron BID 4. Pt to make [place] aware of SOB, dizziness, or BRBPR  All questions were answered, patient verbalized understanding and is in agreement with plan as outlined above.   Follow Up: 3 months   Karl Knarr L. Alver Sorrow, MSN, APRN, AGNP-C Adult-Gerontology Nurse Practitioner [facility]

## 2021-10-16 NOTE — Patient Instructions (Signed)
Please continue your iron pills twice a day Continue pantoprazole once daily We will get you scheduled for upper endoscopic evaluation due to anemia and concern for blood loss in your GI tract  Follow up 3 months

## 2021-10-17 ENCOUNTER — Encounter (INDEPENDENT_AMBULATORY_CARE_PROVIDER_SITE_OTHER): Payer: Self-pay

## 2021-10-21 ENCOUNTER — Other Ambulatory Visit (HOSPITAL_COMMUNITY): Payer: Self-pay

## 2021-10-25 ENCOUNTER — Other Ambulatory Visit: Payer: Self-pay

## 2021-10-25 ENCOUNTER — Emergency Department (HOSPITAL_COMMUNITY)
Admission: EM | Admit: 2021-10-25 | Discharge: 2021-10-25 | Disposition: A | Discharge status: Home / Self Care | Payer: No Typology Code available for payment source | Attending: Emergency Medicine | Admitting: Emergency Medicine

## 2021-10-25 DIAGNOSIS — Z79899 Other long term (current) drug therapy: Secondary | ICD-10-CM | POA: Diagnosis not present

## 2021-10-25 DIAGNOSIS — R319 Hematuria, unspecified: Secondary | ICD-10-CM

## 2021-10-25 DIAGNOSIS — N390 Urinary tract infection, site not specified: Secondary | ICD-10-CM | POA: Diagnosis not present

## 2021-10-25 LAB — URINALYSIS, ROUTINE W REFLEX MICROSCOPIC
Bilirubin Urine: NEGATIVE
Glucose, UA: NEGATIVE mg/dL
Ketones, ur: NEGATIVE mg/dL
Nitrite: NEGATIVE
Protein, ur: 100 mg/dL — AB
RBC / HPF: >50 RBC/hpf — ABNORMAL HIGH (ref 0–5)
Specific Gravity, Urine: 1.017 (ref 1.005–1.030)
WBC, UA: >50 WBC/hpf — ABNORMAL HIGH (ref 0–5)
pH: 6 (ref 5.0–8.0)

## 2021-10-25 LAB — BASIC METABOLIC PANEL
Anion gap: 6 (ref 5–15)
BUN: 21 mg/dL (ref 8–23)
CO2: 18 mmol/L — ABNORMAL LOW (ref 22–32)
Calcium: 8.5 mg/dL — ABNORMAL LOW (ref 8.9–10.3)
Chloride: 113 mmol/L — ABNORMAL HIGH (ref 98–111)
Creatinine, Ser: 1.37 mg/dL — ABNORMAL HIGH (ref 0.61–1.24)
GFR, Estimated: 48 mL/min — ABNORMAL LOW (ref >60)
Glucose, Bld: 129 mg/dL — ABNORMAL HIGH (ref 70–99)
Potassium: 3.7 mmol/L (ref 3.5–5.1)
Sodium: 137 mmol/L (ref 135–145)

## 2021-10-25 LAB — CBC
HCT: 30.6 % — ABNORMAL LOW (ref 39.0–52.0)
Hemoglobin: 10.1 g/dL — ABNORMAL LOW (ref 13.0–17.0)
MCH: 34.8 pg — ABNORMAL HIGH (ref 26.0–34.0)
MCHC: 33 g/dL (ref 30.0–36.0)
MCV: 105.5 fL — ABNORMAL HIGH (ref 80.0–100.0)
Platelets: 228 10*3/uL (ref 150–400)
RBC: 2.9 MIL/uL — ABNORMAL LOW (ref 4.22–5.81)
RDW: 13.9 % (ref 11.5–15.5)
WBC: 6.8 10*3/uL (ref 4.0–10.5)
nRBC: 0 % (ref 0.0–0.2)

## 2021-10-25 MED ORDER — SODIUM CHLORIDE 0.9 % IV SOLN
1.0000 g | Freq: Once | INTRAVENOUS | Status: AC
  Administered 2021-10-25: 1 g via INTRAVENOUS
  Filled 2021-10-25: qty 10

## 2021-10-25 MED ORDER — CEFADROXIL 500 MG PO CAPS: 500.0000 mg | ORAL_CAPSULE | Freq: Two times a day (BID) | ORAL | 0 refills | Status: DC

## 2021-10-25 NOTE — ED Triage Notes (Signed)
Pt reports he went to the restroom about an hour ago and noticed his urine was bloody. States his urine has a foul odor. Denies any dysuria.

## 2021-10-25 NOTE — Discharge Instructions (Signed)
Please take antibiotics twice daily for the next 7 days.  I would like for you to follow-up with your primary care doctor for further evaluation.  Return to the [facility] apartment for any worsening symptoms you might have.

## 2021-10-25 NOTE — ED Notes (Signed)
Patient and family verbalizes understanding of discharge instructions. Opportunity for questioning and answers were provided. Armband removed by staff, pt discharged from ED. Wheeled out to car and assisted into car with son to drive home.

## 2021-10-25 NOTE — ED Provider Notes (Signed)
[facility] EMERGENCY DEPARTMENT Provider Note   CSN: [id number] Arrival date & time: 10/25/21  2003     History Chief Complaint  Patient presents with   Hematuria    Derrick Long is a 88 y.o. male patient who presents to the emergency department today for further evaluation of hematuria and urinary urgency and foul-smelling urine started earlier today.  Patient had some reddish tinted urine.  This family at bedside states that the urine was very foul-smelling.  He does report associated denies any abdominal pain, nausea, vomiting, diarrhea.   Hematuria       Home Medications Prior to Admission medications   Medication Sig Start Date End Date Taking? Authorizing Provider  cefadroxil (DURICEF) 500 MG capsule Take 1 capsule (500 mg total) by mouth 2 (two) times daily. 10/25/21  Yes Raul Del, Marli Diego M, PA-C  acetaminophen (TYLENOL) 650 MG CR tablet Take 1,300 mg by mouth every 8 (eight) hours as needed for pain.    [provider]  ascorbic acid (VITAMIN C) 250 MG tablet Take 250 mg by mouth 3 (three) times daily. Take with Iron (Ferrous Sulfate) to promote better absorption 03/18/21   [provider]  atorvastatin (LIPITOR) 40 MG tablet Take 20 mg by mouth daily. 08/13/21   [provider]  carvedilol (COREG) 3.125 MG tablet Take 3.125 mg by mouth 2 (two) times daily with a meal.    [provider]  Cholecalciferol 25 MCG (1000 UT) tablet Take 1,000 Units by mouth daily.    [provider]  clotrimazole-betamethasone (LOTRISONE) cream Apply 1 application. topically 2 (two) times daily. 05/21/21   McKenzie, Candee Furbish, MD  Cyanocobalamin 1000 MCG CAPS Take 1 tablet by mouth daily. 11/03/13   [provider]  darolutamide (NUBEQA) 300 MG tablet Take 1 tablet (300 mg total) by mouth 2 (two) times daily with a meal. 10/06/21   Derek Jack, MD  diclofenac sodium (VOLTAREN) 1 % GEL Apply 2 g topically 4 (four) times daily. 06/19/17    Rancour, Annie Main, MD  Ensure (ENSURE) Take 237 mLs by mouth daily as needed (nutrition). Vanilla or chocolate    [provider]  ferrous sulfate 324 MG TBEC Take 324 mg by mouth 2 (two) times daily.    [provider]  megestrol (MEGACE) 400 MG/10ML suspension Take 10 mLs (400 mg total) by mouth 2 (two) times daily. 03/20/21   Derek Jack, MD  pantoprazole (PROTONIX) 40 MG tablet Take 1 tablet (40 mg total) by mouth daily. 10/16/21   Carlan, Chelsea L, NP  predniSONE (DELTASONE) 5 MG tablet Take 1 tablet (5 mg total) by mouth every morning. Continuous 08/26/21   Derek Jack, MD  rivaroxaban (XARELTO) 20 MG TABS tablet Take 1 tablet (20 mg total) by mouth daily with supper. 08/06/21   Manuella Ghazi, Pratik D, DO  tamsulosin (FLOMAX) 0.4 MG CAPS capsule Take 1 capsule (0.4 mg total) by mouth daily after supper. 05/07/21   McKenzie, Candee Furbish, MD  traMADol (ULTRAM) 50 MG tablet Take 1 tablet (50 mg total) by mouth every 12 (twelve) hours as needed. Patient taking differently: Take 50 mg by mouth every 12 (twelve) hours as needed for moderate pain or severe pain. 09/17/21   Magnant, Gerrianne Scale, PA-C      Allergies    Patient has no known allergies.    Review of Systems   Review of Systems  Genitourinary:  Positive for hematuria.  All other systems reviewed and are negative.   Physical  Exam Updated Vital Signs BP (!) 159/64 (BP Location: Right Arm)   Pulse 70   Temp 97.8 F (36.6 C) (Oral)   Resp 16   Ht '6\' 1"'$  (1.854 m)   Wt 84.4 kg   SpO2 100%   BMI 24.54 kg/m  Physical Exam Vitals and nursing note reviewed.  Constitutional:      General: He is not in acute distress.    Appearance: Normal appearance.  HENT:     Head: Normocephalic and atraumatic.  Eyes:     General:        Right eye: No discharge.        Left eye: No discharge.  Cardiovascular:     Comments: Regular rate and rhythm.  S1/S2 are distinct without any evidence of murmur, rubs, or gallops.   Radial pulses are 2+ bilaterally.  Dorsalis pedis pulses are 2+ bilaterally.  No evidence of pedal edema. Pulmonary:     Comments: Clear to auscultation bilaterally.  Normal effort.  No respiratory distress.  No evidence of wheezes, rales, or rhonchi heard throughout. Abdominal:     General: Abdomen is flat. Bowel sounds are normal. There is no distension.     Tenderness: There is no abdominal tenderness. There is no guarding or rebound.  Musculoskeletal:        General: Normal range of motion.     Cervical back: Neck supple.  Skin:    General: Skin is warm and dry.     Findings: No rash.  Neurological:     General: No focal deficit present.     Mental Status: He is alert.  Psychiatric:        Mood and Affect: Mood normal.        Behavior: Behavior normal.     ED Results / Procedures / Treatments   Labs (all labs ordered are listed, but only abnormal results are displayed) Labs Reviewed  URINALYSIS, ROUTINE W REFLEX MICROSCOPIC - Abnormal; Notable for the following components:      Result Value   APPearance CLOUDY (*)    Hgb urine dipstick MODERATE (*)    Protein, ur 100 (*)    Leukocytes,Ua MODERATE (*)    RBC / HPF >50 (*)    WBC, UA >50 (*)    Bacteria, UA RARE (*)    All other components within normal limits  BASIC METABOLIC PANEL - Abnormal; Notable for the following components:   Chloride 113 (*)    CO2 18 (*)    Glucose, Bld 129 (*)    Creatinine, Ser 1.37 (*)    Calcium 8.5 (*)    GFR, Estimated 48 (*)    All other components within normal limits  CBC - Abnormal; Notable for the following components:   RBC 2.90 (*)    Hemoglobin 10.1 (*)    HCT 30.6 (*)    MCV 105.5 (*)    MCH 34.8 (*)    All other components within normal limits  URINE CULTURE    EKG None  Radiology No results found.  Procedures Procedures    Medications Ordered in ED Medications  cefTRIAXone (ROCEPHIN) 1 g in sodium chloride 0.9 % 100 mL IVPB (1 g Intravenous New Bag/Given  10/25/21 2150)    ED Course/ Medical Decision Making/ A&P Clinical Course as of 10/25/21 2236  Sat Oct 25, 2021  2224 CBC(!) No evidence of leukocytosis.  There is mild anemia but it is stable in comparison to previous. [CF]  2225 Urinalysis, Routine  w reflex microscopic(!) There is moderate amount of blood, leukocytes, and a large amount of pyuria.  Squame cells are low.  This is likely the source of his symptoms. [CF]  2536 Basic metabolic panel(!) Elevated creatinine which seems to be at the baseline for the patient.  There is evidence of hyperchloremia. [CF]    Clinical Course User Index [CF] Hendricks Limes, PA-C                           Medical Decision Making NAHUEL WILBERT is a 88 y.o. male patient who presents to the emergency department today for further evaluation of hematuria, urinary urgency, and foul-smelling urine.  We will get some basic labs and UA and send off for culture considering the patient's age.  I suspect this is likely a urinary tract infection given the history.  We will plan to reassess him some labs return.  There is evidence of urinary tract infection on UA. This is likely the source of his symptoms.  He was given ceftriaxone here and give him some Duricef to go home with.  Urine culture should be sent.  He is safe for discharge at this time.  Strict return precautions were discussed.  Amount and/or Complexity of Data Reviewed Labs: ordered. Decision-making details documented in ED Course.  Risk Prescription drug management.   Final Clinical Impression(s) / ED Diagnoses Final diagnoses:  Hematuria, unspecified type  Lower urinary tract infectious disease    Rx / DC Orders ED Discharge Orders          Ordered    cefadroxil (DURICEF) 500 MG capsule  2 times daily        10/25/21 2226              Myna Bright [place], [place] 10/25/21 2236    Milton Ferguson, MD 10/27/21 1038

## 2021-10-27 LAB — URINE CULTURE

## 2021-10-28 ENCOUNTER — Emergency Department (HOSPITAL_COMMUNITY)
Admission: EM | Admit: 2021-10-28 | Discharge: 2021-10-28 | Discharge status: Against Medical Advice | Payer: No Typology Code available for payment source | Attending: Emergency Medicine | Admitting: Emergency Medicine

## 2021-10-28 ENCOUNTER — Other Ambulatory Visit: Payer: Self-pay

## 2021-10-28 DIAGNOSIS — Z5321 Procedure and treatment not carried out due to patient leaving prior to being seen by health care provider: Secondary | ICD-10-CM | POA: Diagnosis not present

## 2021-10-28 DIAGNOSIS — R600 Localized edema: Secondary | ICD-10-CM | POA: Insufficient documentation

## 2021-10-28 DIAGNOSIS — R42 Dizziness and giddiness: Secondary | ICD-10-CM | POA: Insufficient documentation

## 2021-10-28 NOTE — ED Triage Notes (Signed)
Pov from home. Cc of leg swelling. Was here a couple days ago for dizziness and was taken off his lasix. Thinks he needs to be back on it

## 2021-10-29 ENCOUNTER — Emergency Department (HOSPITAL_COMMUNITY)
Admission: EM | Admit: 2021-10-29 | Discharge: 2021-10-29 | Disposition: A | Discharge status: Home / Self Care | Payer: Medicare Other | Attending: Emergency Medicine | Admitting: Emergency Medicine

## 2021-10-29 ENCOUNTER — Other Ambulatory Visit: Payer: Self-pay

## 2021-10-29 ENCOUNTER — Encounter (HOSPITAL_COMMUNITY): Payer: Self-pay | Admitting: *Deleted

## 2021-10-29 ENCOUNTER — Emergency Department (HOSPITAL_COMMUNITY): Payer: Medicare Other

## 2021-10-29 DIAGNOSIS — Z7901 Long term (current) use of anticoagulants: Secondary | ICD-10-CM | POA: Diagnosis not present

## 2021-10-29 DIAGNOSIS — Z85038 Personal history of other malignant neoplasm of large intestine: Secondary | ICD-10-CM | POA: Insufficient documentation

## 2021-10-29 DIAGNOSIS — R600 Localized edema: Secondary | ICD-10-CM | POA: Diagnosis not present

## 2021-10-29 DIAGNOSIS — M7989 Other specified soft tissue disorders: Secondary | ICD-10-CM | POA: Diagnosis present

## 2021-10-29 DIAGNOSIS — Z7902 Long term (current) use of antithrombotics/antiplatelets: Secondary | ICD-10-CM | POA: Insufficient documentation

## 2021-10-29 DIAGNOSIS — I480 Paroxysmal atrial fibrillation: Secondary | ICD-10-CM | POA: Insufficient documentation

## 2021-10-29 DIAGNOSIS — N183 Chronic kidney disease, stage 3 unspecified: Secondary | ICD-10-CM | POA: Insufficient documentation

## 2021-10-29 DIAGNOSIS — R609 Edema, unspecified: Secondary | ICD-10-CM

## 2021-10-29 DIAGNOSIS — Z8546 Personal history of malignant neoplasm of prostate: Secondary | ICD-10-CM | POA: Diagnosis not present

## 2021-10-29 LAB — BASIC METABOLIC PANEL
Anion gap: 2 — ABNORMAL LOW (ref 5–15)
BUN: 15 mg/dL (ref 8–23)
CO2: 21 mmol/L — ABNORMAL LOW (ref 22–32)
Calcium: 8.6 mg/dL — ABNORMAL LOW (ref 8.9–10.3)
Chloride: 112 mmol/L — ABNORMAL HIGH (ref 98–111)
Creatinine, Ser: 1.04 mg/dL (ref 0.61–1.24)
GFR, Estimated: >60 mL/min (ref >60)
Glucose, Bld: 94 mg/dL (ref 70–99)
Potassium: 3.9 mmol/L (ref 3.5–5.1)
Sodium: 135 mmol/L (ref 135–145)

## 2021-10-29 LAB — CBC
HCT: 29.7 % — ABNORMAL LOW (ref 39.0–52.0)
Hemoglobin: 9.7 g/dL — ABNORMAL LOW (ref 13.0–17.0)
MCH: 35.5 pg — ABNORMAL HIGH (ref 26.0–34.0)
MCHC: 32.7 g/dL (ref 30.0–36.0)
MCV: 108.8 fL — ABNORMAL HIGH (ref 80.0–100.0)
Platelets: 194 10*3/uL (ref 150–400)
RBC: 2.73 MIL/uL — ABNORMAL LOW (ref 4.22–5.81)
RDW: 14 % (ref 11.5–15.5)
WBC: 7.4 10*3/uL (ref 4.0–10.5)
nRBC: 0 % (ref 0.0–0.2)

## 2021-10-29 MED ORDER — FUROSEMIDE 10 MG/ML IJ SOLN
20.0000 mg | Freq: Once | INTRAMUSCULAR | Status: AC
  Administered 2021-10-29: 20 mg via INTRAVENOUS
  Filled 2021-10-29: qty 2

## 2021-10-29 NOTE — Discharge Instructions (Addendum)
Thank you for coming to [facility] Emergency Department. You were seen for leg swelling. We did an exam, labs, and imaging, and these showed no acute findings. You were given a dose of lasix here in the emergency department. Please call your [place] cardiologist to follow up in the next couple of days. If you cannot follow up with them, please follow up with your primary care provider within 1 week to discuss restarting your lasix.   Do not hesitate to return to the ED or call 911 if you experience: -Worsening symptoms -Chest pain, shortness of breath -Lightheadedness, passing out -Fevers/chills -Anything else that concerns you

## 2021-10-29 NOTE — ED Triage Notes (Signed)
Pt c/o bilateral feet swelling for the last 2 days; pt denies any sob; daughter states pt was recently taken off his lasix

## 2021-10-29 NOTE — ED Provider Notes (Signed)
[facility] EMERGENCY DEPARTMENT Provider Note   CSN: [id number] Arrival date & time: 10/29/21  4270     History  Chief Complaint  Patient presents with   Leg Swelling    Derrick Long is a 88 y.o. male with hypertension, paroxysmal A-fib on Xarelto, pacemaker, prostate cancer stage IVb, CKD stage III, HLD, migraines, GERD, history of colon cancer, right adrenal mass, history of GI bleed, osteopenia, gout, DNR in effect presents with leg swelling.   Presents with bilateral feet swelling x2 days. Symmetric, mild. Not painful. No trauma. Denies any f/c shortness of breath, chest pain, palpitations, orthopnea, lower extremity asymmetric swelling/erythema, abd pain, N/V/D/C.  Daughter at bedside states he was recently taken off of his Lasix.  Per chart reivew, patient presented to ED here last night but left before being seen.  Presented on 9/9 for foul-smelling urine, found to have UTI, given ceftriaxone and Rx for Duricef.  Admitted from 8/27-8/34 for an episode of syncope found to have orthostatic drop in blood pressure with normal pacemaker interrogation.  Echo demonstrated normal ejection fraction.  Lasix and amlodipine were discontinued at that time.  HPI     Home Medications Prior to Admission medications   Medication Sig Start Date End Date Taking? Authorizing Provider  acetaminophen (TYLENOL) 650 MG CR tablet Take 1,300 mg by mouth every 8 (eight) hours as needed for pain.   Yes [provider]  carvedilol (COREG) 3.125 MG tablet Take 3.125 mg by mouth 2 (two) times daily with a meal.   Yes [provider]  cefadroxil (DURICEF) 500 MG capsule Take 1 capsule (500 mg total) by mouth 2 (two) times daily. 10/25/21  Yes Hendricks Limes, PA-C  Cholecalciferol 25 MCG (1000 UT) tablet Take 1,000 Units by mouth daily.   Yes [provider]  darolutamide (NUBEQA) 300 MG tablet Take 1 tablet (300 mg total) by mouth 2 (two) times daily with a meal. 10/06/21   Yes Derek Jack, MD  diclofenac sodium (VOLTAREN) 1 % GEL Apply 2 g topically 4 (four) times daily. 06/19/17  Yes Rancour, Annie Main, MD  Ensure (ENSURE) Take 237 mLs by mouth daily as needed (nutrition). Vanilla or chocolate   Yes [provider]  ferrous sulfate 324 MG TBEC Take 324 mg by mouth 2 (two) times daily.   Yes [provider]  megestrol (MEGACE) 400 MG/10ML suspension Take 10 mLs (400 mg total) by mouth 2 (two) times daily. 03/20/21  Yes Derek Jack, MD  pantoprazole (PROTONIX) 40 MG tablet Take 1 tablet (40 mg total) by mouth daily. 10/16/21  Yes Carlan, Chelsea L, NP  predniSONE (DELTASONE) 5 MG tablet Take 1 tablet (5 mg total) by mouth every morning. Continuous 08/26/21  Yes Derek Jack, MD  rivaroxaban (XARELTO) 20 MG TABS tablet Take 1 tablet (20 mg total) by mouth daily with supper. 08/06/21  Yes Manuella Ghazi, Pratik D, DO  tamsulosin (FLOMAX) 0.4 MG CAPS capsule Take 1 capsule (0.4 mg total) by mouth daily after supper. 05/07/21  Yes McKenzie, Candee Furbish, MD  traMADol (ULTRAM) 50 MG tablet Take 1 tablet (50 mg total) by mouth every 12 (twelve) hours as needed. Patient taking differently: Take 50 mg by mouth every 12 (twelve) hours as needed for moderate pain or severe pain. 09/17/21  Yes Magnant, Gerrianne Scale, PA-C  clotrimazole-betamethasone (LOTRISONE) cream Apply 1 application. topically 2 (two) times daily. 05/21/21   McKenzie, Candee Furbish, MD      Allergies    Patient has no  known allergies.    Review of Systems   Review of Systems Review of systems negative for fevers/chills, Cp, SOB.  A 10 point review of systems was performed and is negative unless otherwise reported in HPI.  Physical Exam Updated Vital Signs BP (!) 164/64   Pulse 63   Temp 97.6 F (36.4 C) (Oral)   Resp (!) 21   Ht '6\' 1"'$  (1.854 m)   Wt 84.4 kg   SpO2 99%   BMI 24.54 kg/m  Physical Exam General: Elderly appearing male, lying in bed.  HEENT: PERRLA, Sclera anicteric,  MMM, trachea midline. Cardiology: RRR, no murmurs/rubs/gallops. BL radial and DP pulses equal bilaterally.  Resp: Normal respiratory rate and effort. CTAB, no wheezes, rhonchi, crackles.  Abd: Soft, non-tender, non-distended. No rebound tenderness or guarding.  GU: Deferred. MSK: Symmetric 2+ pitting edema in BL LEs up to mid calf. No TTP, no erythema.  No cyanosis or clubbing. Skin: warm, dry. No rashes or lesions. Neuro: A&Ox4, CNs II-XII grossly intact. MAEs. Sensation grossly intact.  Psych: Normal mood and affect.   ED Results / Procedures / Treatments   Labs (all labs ordered are listed, but only abnormal results are displayed) Labs Reviewed  BASIC METABOLIC PANEL - Abnormal; Notable for the following components:      Result Value   Chloride 112 (*)    CO2 21 (*)    Calcium 8.6 (*)    Anion gap 2 (*)    All other components within normal limits  CBC - Abnormal; Notable for the following components:   RBC 2.73 (*)    Hemoglobin 9.7 (*)    HCT 29.7 (*)    MCV 108.8 (*)    MCH 35.5 (*)    All other components within normal limits   Radiology DG Chest Port 1 View  Result Date: 10/29/2021 CLINICAL DATA:  Chest pain and shortness of breath with feet swelling EXAM: PORTABLE CHEST 1 VIEW COMPARISON:  Chest radiograph 10/12/2021 FINDINGS: Left-sided dual lead cardiac device in place with leads likely in the right atrium and right ventricle. Redemonstrated asymmetric elevation of the left hemidiaphragm, similar to CT abdomen and pelvis dated 08/01/2021. Left basilar atelectasis. Unchanged cardiac and mediastinal contours compared to chest radiograph dated 10/12/2021. Redemonstrated subacute healing fracture at the distal right clavicle. IMPRESSION: No acute radiographic cardiopulmonary findings Electronically Signed   By: Marin Roberts M.D.   On: 10/29/2021 09:40    Procedures Procedures    Medications Ordered in ED Medications  furosemide (LASIX) injection 20 mg (20 mg  Intravenous Given 10/29/21 1241)    ED Course/ Medical Decision Making/ A&P                          Medical Decision Making Amount and/or Complexity of Data Reviewed Labs:  Decision-making details documented in ED Course. Radiology:  Decision-making details documented in ED Course.  Risk Prescription drug management.   Patient overall well-appearing, hypertensive, otherwise vitally stable. Asymptomatic except for symmetric LEE that began after he was hospitalized and his Lasix was discontinued.  Patient has no crackles, shortness of breath, or pleural effusions/pulm edema present on chest x-ray.  He has no increased work of breathing and is SORA, no c/f acute HF exacerbation requiring admission.  Patient has unremarkable electrolytes. Hemoglobin is slightly decreased from baseline but patient reports no hematochezia/melena. No leukocytosis.   I have personally reviewed and interpreted all labs and imaging.   Clinical Course as  of 10/29/21 1352  Wed Oct 29, 2021  0943 DG Chest Port 1 View No acute radiographic cardiopulmonary findings [HN]  1112 Potassium: 3.9 [HN]    Clinical Course User Index [HN] Audley Hose, MD    Patient is given one dose of IV lasix 20 mg here in the ED. he reports that he feels well and would like to go home.  Informed that he should f/u with his [place] cardiologist this week to discuss restarting his lasix or another diuretic. If he cannot follow up with his cardiologist, patient is instructed to f/u with his PCP. Patient and daughter report understanding.  All questions answered to patient daughter satisfaction.  Will be discharged discharge directions return precautions.        Final Clinical Impression(s) / ED Diagnoses Final diagnoses:  Peripheral edema    Rx / DC Orders ED Discharge Orders     None        This note was created using dictation software, which may contain spelling or grammatical errors.    Audley Hose, MD 10/29/21  1352

## 2021-10-29 NOTE — ED Notes (Signed)
Blood obtained and sent to lab.

## 2021-11-03 ENCOUNTER — Ambulatory Visit: Payer: Self-pay

## 2021-11-03 ENCOUNTER — Ambulatory Visit: Payer: Medicare Other | Admitting: Orthopedic Surgery

## 2021-11-03 DIAGNOSIS — M19012 Primary osteoarthritis, left shoulder: Secondary | ICD-10-CM

## 2021-11-03 DIAGNOSIS — M25512 Pain in left shoulder: Secondary | ICD-10-CM

## 2021-11-06 ENCOUNTER — Encounter: Payer: Self-pay | Admitting: Orthopedic Surgery

## 2021-11-06 NOTE — Progress Notes (Signed)
   Procedure Note  Patient: Derrick Long             Date of Birth: 1926/10/08           MRN: [id number]             Visit Date: 11/03/2021  Procedures: Visit Diagnoses:  1. Primary osteoarthritis, left shoulder   2. Left shoulder pain, unspecified chronicity     Large Joint Inj: L glenohumeral on 11/03/2021 10:52 AM Indications: diagnostic evaluation and pain Details: 22 G 3.5 in needle, ultrasound-guided posterior approach  Arthrogram: No  Medications: 9 mL bupivacaine 0.5 %; 40 mg methylPREDNISolone acetate 40 MG/ML; 5 mL lidocaine 1 % Outcome: tolerated well, no immediate complications  Patient returns for planned left glenohumeral injection.  Also for check-in on his right shoulder that sustained right clavicle fracture several months ago.  Right shoulder is doing very well with no pain.  He is able to sleep on his right side without difficulty.  Had left shoulder subacromial injection several months ago without any relief.  He would like to try glenohumeral injection today.  Tolerated injection well without difficulty.  Follow-up as needed unless pain is not improved. Procedure, treatment alternatives, risks and benefits explained, specific risks discussed. Consent was given by the patient. Immediately prior to procedure a time out was called to verify the correct patient, procedure, equipment, support staff and site/side marked as required. Patient was prepped and draped in the usual sterile fashion.

## 2021-11-07 ENCOUNTER — Other Ambulatory Visit (HOSPITAL_COMMUNITY): Payer: Self-pay

## 2021-11-08 MED ORDER — BUPIVACAINE HCL 0.5 % IJ SOLN
9.0000 mL | INTRAMUSCULAR | Status: AC | PRN
  Administered 2021-11-03: 9 mL via INTRA_ARTICULAR

## 2021-11-08 MED ORDER — LIDOCAINE HCL 1 % IJ SOLN
5.0000 mL | INTRAMUSCULAR | Status: AC | PRN
  Administered 2021-11-03: 5 mL

## 2021-11-08 MED ORDER — METHYLPREDNISOLONE ACETATE 40 MG/ML IJ SUSP
40.0000 mg | INTRAMUSCULAR | Status: AC | PRN
  Administered 2021-11-03: 40 mg via INTRA_ARTICULAR

## 2021-11-10 ENCOUNTER — Other Ambulatory Visit (HOSPITAL_COMMUNITY): Payer: Self-pay | Admitting: Hematology

## 2021-11-10 ENCOUNTER — Other Ambulatory Visit (HOSPITAL_COMMUNITY): Payer: Self-pay

## 2021-11-10 ENCOUNTER — Other Ambulatory Visit: Payer: Self-pay | Admitting: *Deleted

## 2021-11-10 DIAGNOSIS — C61 Malignant neoplasm of prostate: Secondary | ICD-10-CM

## 2021-11-10 MED ORDER — NUBEQA 300 MG PO TABS
300.0000 mg | ORAL_TABLET | Freq: Two times a day (BID) | ORAL | 0 refills | Status: DC
  Filled 2021-11-10: qty 60, 30d supply, fill #0

## 2021-11-10 NOTE — Telephone Encounter (Signed)
Refill for Nubeqa approved.  Patient tolerating and is to continue therapy.

## 2021-11-17 ENCOUNTER — Other Ambulatory Visit (HOSPITAL_COMMUNITY): Payer: Self-pay

## 2021-11-19 ENCOUNTER — Encounter (HOSPITAL_COMMUNITY)
Admission: RE | Admit: 2021-11-19 | Discharge: 2021-11-19 | Disposition: A | Discharge status: Home / Self Care | Payer: Medicare Other | Source: Ambulatory Visit | Attending: Gastroenterology | Admitting: Gastroenterology

## 2021-11-19 ENCOUNTER — Encounter (HOSPITAL_COMMUNITY): Payer: Self-pay

## 2021-11-19 VITALS — BP 116/80 | HR 75 | Temp 97.6°F | Resp 18 | Ht 73.0 in | Wt 186.0 lb

## 2021-11-19 DIAGNOSIS — D500 Iron deficiency anemia secondary to blood loss (chronic): Secondary | ICD-10-CM | POA: Insufficient documentation

## 2021-11-19 DIAGNOSIS — Z01812 Encounter for preprocedural laboratory examination: Secondary | ICD-10-CM | POA: Diagnosis present

## 2021-11-19 HISTORY — DX: Presence of cardiac pacemaker: Z95.0

## 2021-11-19 LAB — CBC WITH DIFFERENTIAL/PLATELET
Abs Immature Granulocytes: 0.02 10*3/uL (ref 0.00–0.07)
Basophils Absolute: 0 10*3/uL (ref 0.0–0.1)
Basophils Relative: 0 %
Eosinophils Absolute: 0 10*3/uL (ref 0.0–0.5)
Eosinophils Relative: 1 %
HCT: 34.3 % — ABNORMAL LOW (ref 39.0–52.0)
Hemoglobin: 11.6 g/dL — ABNORMAL LOW (ref 13.0–17.0)
Immature Granulocytes: 1 %
Lymphocytes Relative: 20 %
Lymphs Abs: 0.7 10*3/uL (ref 0.7–4.0)
MCH: 35.2 pg — ABNORMAL HIGH (ref 26.0–34.0)
MCHC: 33.8 g/dL (ref 30.0–36.0)
MCV: 103.9 fL — ABNORMAL HIGH (ref 80.0–100.0)
Monocytes Absolute: 0.7 10*3/uL (ref 0.1–1.0)
Monocytes Relative: 19 %
Neutro Abs: 2.1 10*3/uL (ref 1.7–7.7)
Neutrophils Relative %: 59 %
Platelets: 252 10*3/uL (ref 150–400)
RBC: 3.3 MIL/uL — ABNORMAL LOW (ref 4.22–5.81)
RDW: 13.5 % (ref 11.5–15.5)
WBC: 3.5 10*3/uL — ABNORMAL LOW (ref 4.0–10.5)
nRBC: 0 % (ref 0.0–0.2)
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
## 2021-11-19 NOTE — Patient Instructions (Signed)
Derrick Long  11/19/2021     '@PREFPERIOPPHARMACY'$ @   Your procedure is scheduled on 11/21/2021.  Report to [facility] [place].M.  Call this number if you have problems the morning of surgery:  [phone]   Remember:   Please follow the diet instructions given to you by Dr Colman Cater office.     Take these medicines the morning of surgery with A SIP OF WATER : Carvedilol, Pantoprazole, Prednisone, Flomax and Tramadol    Do not wear jewelry, make-up or nail polish.  Do not wear lotions, powders, or perfumes, or deodorant.  Do not shave 48 hours prior to surgery.  Men may shave face and neck.  Do not bring valuables to the hospital.  [facility] is not responsible for any belongings or valuables.  Contacts, dentures or bridgework may not be worn into surgery.  Leave your suitcase in the car.  After surgery it may be brought to your room.  For patients admitted to the hospital, discharge time will be determined by your treatment team.  Patients discharged the day of surgery will not be allowed to drive home.   Name and phone number of your driver:   family Special instructions:  N/A  Please read over the following fact sheets that you were given. Care and Recovery After Surgery  Monitored Anesthesia Care Anesthesia refers to techniques, procedures, and medicines that help a person stay safe and comfortable during a medical or dental procedure. Monitored anesthesia care, or sedation, is one type of anesthesia. Your anesthesia specialist may recommend sedation if you will be having a procedure that does not require you to be unconscious. You may have this procedure for: Cataract surgery. A dental procedure. A biopsy. A colonoscopy. During the procedure, you may receive a medicine to help you relax (sedative). There are three levels of sedation: Mild sedation. At this level, you may feel awake and relaxed. You will be able to follow directions. Moderate sedation. At  this level, you will be sleepy. You may not remember the procedure. Deep sedation. At this level, you will be asleep. You will not remember the procedure. The more medicine you are given, the deeper your level of sedation will be. Depending on how you respond to the procedure, the anesthesia specialist may change your level of sedation or the type of anesthesia to fit your needs. An anesthesia specialist will monitor you closely during the procedure. Tell a health care provider about: Any allergies you have. All medicines you are taking, including vitamins, herbs, eye drops, creams, and over-the-counter medicines. Any problems you or family members have had with anesthetic medicines. Any blood disorders you have. Any surgeries you have had. Any medical conditions you have, such as sleep apnea. Whether you are pregnant or may be pregnant. Whether you use cigarettes, alcohol, or drugs. Any use of steroids, whether by mouth or as a cream. What are the risks? Generally, this is a safe procedure. However, problems may occur, including: Getting too much medicine (oversedation). Nausea. Allergic reaction to medicines. Trouble breathing. If this happens, a breathing tube may be used to help with breathing. It will be removed when you are awake and breathing on your own. Heart trouble. Lung trouble. Confusion that gets better with time (emergence delirium). What happens before the procedure? Staying hydrated Follow instructions from your health care provider about hydration, which may include: Up to 2 hours before the procedure - you may continue to drink clear liquids, such as water,  clear fruit juice, black coffee, and plain tea. Eating and drinking restrictions Follow instructions from your health care provider about eating and drinking, which may include: 8 hours before the procedure - stop eating heavy meals or foods, such as meat, fried foods, or fatty foods. 6 hours before the procedure -  stop eating light meals or foods, such as toast or cereal. 6 hours before the procedure - stop drinking milk or drinks that contain milk. 2 hours before the procedure - stop drinking clear liquids. Medicines Ask your health care provider about: Changing or stopping your regular medicines. This is especially important if you are taking diabetes medicines or blood thinners. Taking medicines such as aspirin and ibuprofen. These medicines can thin your blood. Do not take these medicines unless your health care provider tells you to take them. Taking over-the-counter medicines, vitamins, herbs, and supplements. Tests and exams You will have a physical exam. You may have blood tests done to show: How well your kidneys and liver are working. How well your blood can clot. General instructions Plan to have a responsible adult take you home from the hospital or clinic. If you will be going home right after the procedure, plan to have a responsible adult care for you for the time you are told. This is important. What happens during the procedure?  Your blood pressure, heart rate, breathing, level of pain, and overall condition will be monitored. An IV will be inserted into one of your veins. You will be given medicines as needed to keep you comfortable during the procedure. This may mean changing the level of sedation. Depending on your age or the procedure, the sedative may be given: As a pill that you will swallow or as a pill that is inserted into the rectum. As an injection into the vein or muscle. As a spray through the nose. The procedure will be performed. Your breathing, heart rate, and blood pressure will be monitored during the procedure. When the procedure is over, the medicine will be stopped. The procedure may vary among health care providers and hospitals. What happens after the procedure? Your blood pressure, heart rate, breathing rate, and blood oxygen level will be monitored until  you leave the hospital or clinic. You may feel sleepy, clumsy, or nauseous. You may feel forgetful about what happened after the procedure. You may vomit. You may continue to get IV fluids. Do not drive or operate machinery until your health care provider says that it is safe. Summary Monitored anesthesia care is used to keep a patient comfortable during short procedures. Tell your health care provider about any allergies or health conditions you have and about all the medicines you are taking. Before the procedure, follow instructions about when to stop eating and drinking and about changing or stopping any medicines. Your blood pressure, heart rate, breathing rate, and blood oxygen level will be monitored until you leave the hospital or clinic. Plan to have a responsible adult take you home from the hospital or clinic. This information is not intended to replace advice given to you by your health care provider. Make sure you discuss any questions you have with your health care provider. Document Revised: 01/07/2021 Document Reviewed: 01/05/2019 Elsevier Patient Education  [facility], Adult Upper endoscopy is a procedure to look inside the upper GI (gastrointestinal) tract. The upper GI tract is made up of: The esophagus. This is the part of the body that moves food from your mouth  to your stomach. The stomach. The duodenum. This is the first part of your small intestine. This procedure is also called esophagogastroduodenoscopy (EGD) or gastroscopy. In this procedure, your health care provider passes a thin, flexible tube (endoscope) through your mouth and down your esophagus into your stomach and into your duodenum. A small camera is attached to the end of the tube. Images from the camera appear on a monitor in the exam room. During this procedure, your health care provider may also remove a small piece of tissue to be sent to a lab and examined under a microscope  (biopsy). Your health care provider may do an upper endoscopy to diagnose cancers of the upper GI tract. You may also have this procedure to find the cause of other conditions, such as: Stomach pain. Heartburn. Pain or problems when swallowing. Nausea and vomiting. Stomach bleeding. Stomach ulcers. Tell a health care provider about: Any allergies you have. All medicines you are taking, including vitamins, herbs, eye drops, creams, and over-the-counter medicines. Any problems you or family members have had with anesthetic medicines. Any bleeding problems you have. Any surgeries you have had. Any medical conditions you have. Whether you are pregnant or may be pregnant. What are the risks? Your healthcare provider will talk with you about risks. These may include: Infection. Bleeding. Allergic reactions to medicines. A tear or hole (perforation) in the esophagus, stomach, or duodenum. What happens before the procedure? When to stop eating and drinking Follow instructions from your health care provider about what you may eat and drink. These may include: 8 hours before your procedure Stop eating most foods. Do not eat meat, fried foods, or fatty foods. Eat only light foods, such as toast or crackers. All liquids are okay except energy drinks and alcohol. 6 hours before your procedure Stop eating. Drink only clear liquids, such as water, clear fruit juice, black coffee, plain tea, and sports drinks. Do not drink energy drinks or alcohol. 2 hours before your procedure Stop drinking all liquids. You may be allowed to take medicines with small sips of water. If you do not follow your health care provider's instructions, your procedure may be delayed or canceled. Medicines Ask your health care provider about: Changing or stopping your regular medicines. This is especially important if you are taking diabetes medicines or blood thinners. Taking medicines such as aspirin and ibuprofen.  These medicines can thin your blood. Do not take these medicines unless your health care provider tells you to take them. Taking over-the-counter medicines, vitamins, herbs, and supplements. General instructions If you will be going home right after the procedure, plan to have a responsible adult: Take you home from the hospital or clinic. You will not be allowed to drive. Care for you for the time you are told. What happens during the procedure?  An IV will be inserted into one of your veins. You may be given one or more of the following: A medicine to help you relax (sedative). A medicine to numb the throat (local anesthetic). You will lie on your left side on an exam table. Your health care provider will pass the endoscope through your mouth and down your esophagus. Your health care provider will use the scope to check the inside of your esophagus, stomach, and duodenum. Biopsies may be taken. The endoscope will be removed. The procedure may vary among health care providers and hospitals. What happens after the procedure? Your blood pressure, heart rate, breathing rate, and blood oxygen level will be  monitored until you leave the hospital or clinic. When your throat is no longer numb, you may be given some fluids to drink. If you were given a sedative during the procedure, it can affect you for several hours. Do not drive or operate machinery until your health care provider says that it is safe. It is up to you to get the results of your procedure. Ask your health care provider, or the department that is doing the procedure, when your results will be ready. Contact a health care provider if you: Have a sore throat that lasts longer than 1 day. Have a fever. Get help right away if you: Vomit blood or your vomit looks like coffee grounds. Have bloody, black, or tarry stools. Have a very bad sore throat or you cannot swallow. Have difficulty breathing or very bad pain in your chest or  abdomen. These symptoms may be an emergency. Get help right away. Call 911. Do not wait to see if the symptoms will go away. Do not drive yourself to the hospital. Summary Upper endoscopy is a procedure to look inside the upper GI tract. During the procedure, an IV will be inserted into one of your veins. You may be given a medicine to help you relax. The endoscope will be passed through your mouth and down your esophagus. Follow instructions from your health care provider about what you can eat and drink. This information is not intended to replace advice given to you by your health care provider. Make sure you discuss any questions you have with your health care provider. Document Revised: 05/14/2021 Document Reviewed: 05/14/2021 Elsevier Patient Education  [place].

## 2021-11-20 ENCOUNTER — Other Ambulatory Visit (HOSPITAL_COMMUNITY): Payer: Self-pay

## 2021-11-21 ENCOUNTER — Ambulatory Visit (HOSPITAL_COMMUNITY): Payer: Medicare Other | Admitting: Anesthesiology

## 2021-11-21 ENCOUNTER — Ambulatory Visit (HOSPITAL_BASED_OUTPATIENT_CLINIC_OR_DEPARTMENT_OTHER): Payer: Medicare Other | Admitting: Anesthesiology

## 2021-11-21 ENCOUNTER — Encounter (HOSPITAL_COMMUNITY): Payer: Self-pay | Admitting: Gastroenterology

## 2021-11-21 ENCOUNTER — Ambulatory Visit (HOSPITAL_COMMUNITY)
Admission: RE | Admit: 2021-11-21 | Discharge: 2021-11-21 | Disposition: A | Discharge status: Home / Self Care | Payer: Medicare Other | Attending: Gastroenterology | Admitting: Gastroenterology

## 2021-11-21 ENCOUNTER — Other Ambulatory Visit: Payer: Self-pay

## 2021-11-21 ENCOUNTER — Encounter (HOSPITAL_COMMUNITY)
Admission: RE | Disposition: A | Discharge status: Home / Self Care | Payer: Self-pay | Source: Home / Self Care | Attending: Gastroenterology

## 2021-11-21 DIAGNOSIS — D509 Iron deficiency anemia, unspecified: Secondary | ICD-10-CM | POA: Insufficient documentation

## 2021-11-21 DIAGNOSIS — K5520 Angiodysplasia of colon without hemorrhage: Secondary | ICD-10-CM | POA: Diagnosis not present

## 2021-11-21 DIAGNOSIS — K31819 Angiodysplasia of stomach and duodenum without bleeding: Secondary | ICD-10-CM

## 2021-11-21 DIAGNOSIS — Z79899 Other long term (current) drug therapy: Secondary | ICD-10-CM | POA: Diagnosis not present

## 2021-11-21 DIAGNOSIS — K259 Gastric ulcer, unspecified as acute or chronic, without hemorrhage or perforation: Secondary | ICD-10-CM | POA: Insufficient documentation

## 2021-11-21 DIAGNOSIS — Z85038 Personal history of other malignant neoplasm of large intestine: Secondary | ICD-10-CM | POA: Diagnosis not present

## 2021-11-21 DIAGNOSIS — K449 Diaphragmatic hernia without obstruction or gangrene: Secondary | ICD-10-CM | POA: Insufficient documentation

## 2021-11-21 DIAGNOSIS — I10 Essential (primary) hypertension: Secondary | ICD-10-CM | POA: Insufficient documentation

## 2021-11-21 DIAGNOSIS — Z8546 Personal history of malignant neoplasm of prostate: Secondary | ICD-10-CM | POA: Insufficient documentation

## 2021-11-21 DIAGNOSIS — K3182 Dieulafoy lesion (hemorrhagic) of stomach and duodenum: Secondary | ICD-10-CM

## 2021-11-21 DIAGNOSIS — K3189 Other diseases of stomach and duodenum: Secondary | ICD-10-CM | POA: Diagnosis not present

## 2021-11-21 DIAGNOSIS — Z7901 Long term (current) use of anticoagulants: Secondary | ICD-10-CM | POA: Diagnosis not present

## 2021-11-21 DIAGNOSIS — Z87891 Personal history of nicotine dependence: Secondary | ICD-10-CM | POA: Diagnosis not present

## 2021-11-21 DIAGNOSIS — N400 Enlarged prostate without lower urinary tract symptoms: Secondary | ICD-10-CM | POA: Diagnosis not present

## 2021-11-21 DIAGNOSIS — Z950 Presence of cardiac pacemaker: Secondary | ICD-10-CM | POA: Diagnosis not present

## 2021-11-21 DIAGNOSIS — K219 Gastro-esophageal reflux disease without esophagitis: Secondary | ICD-10-CM | POA: Insufficient documentation

## 2021-11-21 DIAGNOSIS — I4891 Unspecified atrial fibrillation: Secondary | ICD-10-CM | POA: Insufficient documentation

## 2021-11-21 DIAGNOSIS — D500 Iron deficiency anemia secondary to blood loss (chronic): Secondary | ICD-10-CM

## 2021-11-21 HISTORY — PX: ESOPHAGOGASTRODUODENOSCOPY (EGD) WITH PROPOFOL: SHX5813

## 2021-11-21 HISTORY — PX: HOT HEMOSTASIS: SHX5433

## 2021-11-21 HISTORY — PX: ENTEROSCOPY: SHX5533

## 2021-11-21 SURGERY — ESOPHAGOGASTRODUODENOSCOPY (EGD) WITH PROPOFOL
Anesthesia: General

## 2021-11-21 MED ORDER — PROPOFOL 500 MG/50ML IV EMUL
INTRAVENOUS | Status: DC | PRN
  Administered 2021-11-21: 125 ug/kg/min via INTRAVENOUS

## 2021-11-21 MED ORDER — GLUCAGON HCL RDNA (DIAGNOSTIC) 1 MG IJ SOLR
INTRAMUSCULAR | Status: AC
  Filled 2021-11-21: qty 1

## 2021-11-21 MED ORDER — PROPOFOL 10 MG/ML IV BOLUS
INTRAVENOUS | Status: DC | PRN
  Administered 2021-11-21: 70 mg via INTRAVENOUS

## 2021-11-21 MED ORDER — LACTATED RINGERS IV SOLN: INTRAVENOUS | Status: DC

## 2021-11-21 MED ORDER — LIDOCAINE HCL (CARDIAC) PF 100 MG/5ML IV SOSY
PREFILLED_SYRINGE | INTRAVENOUS | Status: DC | PRN
  Administered 2021-11-21: 50 mg via INTRAVENOUS

## 2021-11-21 MED ORDER — GLUCAGON HCL RDNA (DIAGNOSTIC) 1 MG IJ SOLR
INTRAMUSCULAR | Status: DC | PRN
  Administered 2021-11-21: .5 mg via INTRAVENOUS

## 2021-11-21 NOTE — H&P (Signed)
Derrick Long is an 88 y.o. male.   Chief Complaint: Iron deficiency anemia HPI: Derrick Long is a 88 y.o. male with past medical history of GERD, anemia, BPH, gout, HTN, Hyperlipemia, a fib on Xarelto, metastatic prostate cancer, colon cancer and history of upper GI AVMs and Dieulafoy lesion, who comes for follow-up of anemia.  Patient reports feeling fine and denies any shortness of breath, abdominal pain, nausea, when, fever, chills, melena or hematochezia although he reports having dark stools chronically while being on iron.  Most recent hemoglobin on 11/19/2021 was 11.6 with MCV of 103.  Past Medical History:  Diagnosis Date   Acid reflux    Acute colitis 09/16/2019   AKI (acute kidney injury) ([place]) 07/31/2012   Anemia    BENIGN PROSTATIC HYPERTROPHY, WITH OBSTRUCTION 12/18/2005   Qualifier: Diagnosis of  By: Oleta Mouse     Chest pain 07/31/2012   Colitis 09/16/2019   Colon cancer ([place])    per patient, colon cancer more than 10 years ago s/p right hemicolectomy   Elevated bilirubin    Elevated LFTs    Ex-cigarette smoker    Gout    Hyperlipemia    Hypertension    Hyponatremia 08/01/2021   Intractable abdominal pain 02/03/2021   LATERAL MENISCUS TEAR, RIGHT 04/19/2006   Qualifier: Diagnosis of  By: Jonna Munro MD, Cornelius     Leukocytosis, unspecified 07/31/2012   Paroxysmal atrial fibrillation ([place]) 2014   Presence of permanent cardiac pacemaker    Prostate cancer ([place]) 08/08/2013   SEEDS 20 YEARS AGO   Prostate disease    RENAL CALCULUS 12/18/2005   Qualifier: Diagnosis of  By: Oleta Mouse     RUQ pain    UTI (urinary tract infection) 03/26/2021    Past Surgical History:  Procedure Laterality Date   BIOPSY  02/05/2021   Procedure: BIOPSY;  Surgeon: Harvel Quale, MD;  [facility];  Service: Gastroenterology;;   BIOPSY  08/02/2021   Procedure: BIOPSY;  Surgeon: Daneil Dolin, MD;  Location: [facility];  Service:  Endoscopy;;   COLON SURGERY     Right hemicolectomy in the setting of colon cancer, per patient   CYSTOSCOPY WITH INSERTION OF UROLIFT N/A 05/19/2021   Procedure: CYSTOSCOPY WITH INSERTION OF UROLIFT;  Surgeon: Cleon Gustin, MD;  Location: [facility];  Service: Urology;  Laterality: N/A;   ESOPHAGOGASTRODUODENOSCOPY (EGD) WITH PROPOFOL N/A 02/05/2021   Procedure: ESOPHAGOGASTRODUODENOSCOPY (EGD) WITH PROPOFOL;  Surgeon: Harvel Quale, MD;  [facility];  Service: Gastroenterology;  Laterality: N/A;   ESOPHAGOGASTRODUODENOSCOPY (EGD) WITH PROPOFOL N/A 08/02/2021   Procedure: ESOPHAGOGASTRODUODENOSCOPY (EGD) WITH PROPOFOL;  Surgeon: Daneil Dolin, MD;  [facility];  Service: Endoscopy;  Laterality: N/A;   HOT HEMOSTASIS  08/02/2021   Procedure: HOT HEMOSTASIS (ARGON PLASMA COAGULATION/BICAP);  Surgeon: Daneil Dolin, MD;  [facility];  Service: Endoscopy;;   LEFT HEART CATHETERIZATION WITH CORONARY ANGIOGRAM N/A 07/21/2012   Procedure: LEFT HEART CATHETERIZATION WITH CORONARY ANGIOGRAM;  Surgeon: Troy Sine, MD;  Location: [facility] CATH LAB;  Service: Cardiovascular;  Laterality: N/A;   PACEMAKER INSERTION     PERMANENT PACEMAKER INSERTION N/A 07/22/2012   Procedure: PERMANENT PACEMAKER INSERTION;  Surgeon: Sanda Klein, MD;  Location: [facility] CATH LAB;  Service: Cardiovascular;  Laterality: N/A;   PROSTATE SURGERY      Family History  Problem Relation Age of Onset   Cancer Neg Hx    Social History:  reports  that he quit smoking about 19 years ago. His smoking use included cigarettes. He has quit using smokeless tobacco.  His smokeless tobacco use included chew. He reports that he does not drink alcohol and does not use drugs.  Allergies: No Known Allergies  Medications Prior to Admission  Medication Sig Dispense Refill   carvedilol (COREG) 3.125 MG tablet Take 3.125 mg by mouth 2 (two) times daily with a meal.     cefadroxil (DURICEF) 500 MG  capsule Take 1 capsule (500 mg total) by mouth 2 (two) times daily. 14 capsule 0   Cholecalciferol 25 MCG (1000 UT) tablet Take 1,000 Units by mouth daily.     darolutamide (NUBEQA) 300 MG tablet Take 1 tablet (300 mg total) by mouth 2 (two) times daily with a meal. 60 tablet 0   megestrol (MEGACE) 400 MG/10ML suspension Take 10 mLs (400 mg total) by mouth 2 (two) times daily. 480 mL 2   pantoprazole (PROTONIX) 40 MG tablet Take 1 tablet (40 mg total) by mouth daily. 30 tablet 3   predniSONE (DELTASONE) 5 MG tablet Take 1 tablet (5 mg total) by mouth every morning. Continuous 30 tablet 3   rivaroxaban (XARELTO) 20 MG TABS tablet Take 1 tablet (20 mg total) by mouth daily with supper. 20 tablet 0   tamsulosin (FLOMAX) 0.4 MG CAPS capsule Take 1 capsule (0.4 mg total) by mouth daily after supper. 30 capsule 11   traMADol (ULTRAM) 50 MG tablet Take 1 tablet (50 mg total) by mouth every 12 (twelve) hours as needed. (Patient taking differently: Take 50 mg by mouth every 12 (twelve) hours as needed for moderate pain or severe pain.) 20 tablet 0   acetaminophen (TYLENOL) 650 MG CR tablet Take 1,300 mg by mouth every 8 (eight) hours as needed for pain.     clotrimazole-betamethasone (LOTRISONE) cream Apply 1 application. topically 2 (two) times daily. 30 g 0   diclofenac sodium (VOLTAREN) 1 % GEL Apply 2 g topically 4 (four) times daily. 100 g 0   Ensure (ENSURE) Take 237 mLs by mouth daily as needed (nutrition). Vanilla or chocolate     ferrous sulfate 324 MG TBEC Take 324 mg by mouth 2 (two) times daily.      Results for orders placed or performed during the hospital encounter of 11/19/21 (from the past 48 hour(s))  CBC with Differential/Platelet     Status: Abnormal   Collection Time: 11/19/21 11:15 AM  Result Value Ref Range   WBC 3.5 (L) 4.0 - 10.5 K/uL   RBC 3.30 (L) 4.22 - 5.81 MIL/uL   Hemoglobin 11.6 (L) 13.0 - 17.0 g/dL   HCT 34.3 (L) 39.0 - 52.0 %   MCV 103.9 (H) 80.0 - 100.0 fL   MCH  35.2 (H) 26.0 - 34.0 pg   MCHC 33.8 30.0 - 36.0 g/dL   RDW 13.5 11.5 - 15.5 %   Platelets 252 150 - 400 K/uL   nRBC 0.0 0.0 - 0.2 %   Neutrophils Relative % 59 %   Neutro Abs 2.1 1.7 - 7.7 K/uL   Lymphocytes Relative 20 %   Lymphs Abs 0.7 0.7 - 4.0 K/uL   Monocytes Relative 19 %   Monocytes Absolute 0.7 0.1 - 1.0 K/uL   Eosinophils Relative 1 %   Eosinophils Absolute 0.0 0.0 - 0.5 K/uL   Basophils Relative 0 %   Basophils Absolute 0.0 0.0 - 0.1 K/uL   Immature Granulocytes 1 %   Abs Immature Granulocytes 0.02  0.00 - 0.07 K/uL    Comment: Performed at [facility], [address], [place], [place] [address]   No results found.  Review of Systems  All other systems reviewed and are negative.   Blood pressure (!) 194/73, pulse 63, temperature 99.4 F (37.4 C), temperature source Oral, resp. rate (!) 21, SpO2 97 %. Physical Exam  GENERAL: The patient is AO x3, in no acute distress. HEENT: Head is normocephalic and atraumatic. EOMI are intact. Mouth is well hydrated and without lesions. NECK: Supple. No masses LUNGS: Clear to auscultation. No presence of rhonchi/wheezing/rales. Adequate chest expansion HEART: RRR, normal s1 and s2. ABDOMEN: Soft, nontender, no guarding, no peritoneal signs, and nondistended. BS +. No masses. EXTREMITIES: Without any cyanosis, clubbing, rash, lesions or edema. NEUROLOGIC: AOx3, no focal motor deficit. SKIN: no jaundice, no rashes  Assessment/Plan Derrick Long is a 88 y.o. male with past medical history of GERD, anemia, BPH, gout, HTN, Hyperlipemia, a fib on Xarelto, metastatic prostate cancer, colon cancer and history of upper GI AVMs and Dieulafoy lesion, who comes for follow-up of anemia.  We will proceed with push enteroscopy.  Harvel Quale, MD 11/21/2021, 9:24 AM

## 2021-11-21 NOTE — Op Note (Signed)
[facility] Patient Name: Derrick Long Procedure Date: 11/21/2021 9:40 AM MRN: [id number] Date of Birth: 08-03-1926 Attending MD: Maylon Peppers ,  CSN: [id number] Age: 88 Admit Type: Outpatient Procedure:                Small bowel enteroscopy Indications:              Iron deficiency anemia Providers:                Maylon Peppers, Lambert Mody, Tammy Vaught,                            RN, Everardo Pacific Referring MD:              Medicines:                Monitored Anesthesia Care Complications:            No immediate complications. Estimated Blood Loss:     Estimated blood loss: none. Procedure:                After obtaining informed consent, the endoscope was                            passed under direct vision. Throughout the                            procedure, the patient's blood pressure, pulse, and                            oxygen saturations were monitored continuously. The                            [id number]) scope was introduced through                            the mouth and advanced to the proximal jejunum. The                            small bowel enteroscopy was accomplished without                            difficulty. The patient tolerated the procedure                            well. Scope In: 9:50:15 AM Scope Out: 10:11:49 AM Total Procedure Duration: 0 hours 21 minutes 34 seconds  Findings:      A 1 cm hiatal hernia was present.      Localized mucosal changes characterized by dark pigementation were found       in the cardia - this was located at the area of herniated gastric       mucosa. Biopsies were taken with a cold forceps for histology.      A few localized diminutive erosions with no stigmata of recent bleeding       were found in the gastric body.      A single small angiodysplastic lesion with no bleeding was found in the       gastric antrum. Coagulation for bleeding  prevention using argon plasma       at 0.3  liters/minute and 20 watts was successful.      There was no evidence of significant pathology in the entire examined       duodenum.      Two angioectasias with no bleeding were found in the proximal jejunum.       Coagulation for bleeding prevention using argon plasma at 0.3       liters/minute and 20 watts was successful. Impression:               - 1 cm hiatal hernia.                           - Dark pigementation mucosa in the cardia. Biopsied.                           - Erosive gastropathy with no stigmata of recent                            bleeding.                           - A single non-bleeding angiodysplastic lesion in                            the stomach. Treated with argon plasma coagulation                            (APC).                           - Normal examined duodenum.                           - Two non-bleeding angioectasias in the jejunum.                            Treated with argon plasma coagulation (APC). Moderate Sedation:      Per Anesthesia Care Recommendation:           - Discharge patient to home (ambulatory).                           - Resume previous diet.                           - Continue oral iron and pantoprazole daily.                           - Await pathology results.                           - Will hold off on colonsocopy at this moment.                           - Restart Eliquis tonight. Procedure Code(s):        --- Professional ---  [id number], 52, Small intestinal endoscopy, enteroscopy                            beyond second portion of duodenum, not including                            ileum; with control of bleeding (eg, injection,                            bipolar cautery, unipolar cautery, laser, heater                            probe, stapler, plasma coagulator)                           44361, Small intestinal endoscopy, enteroscopy                            beyond second portion of duodenum, not  including                            ileum; with biopsy, single or multiple Diagnosis Code(s):        --- Professional ---                           K44.9, Diaphragmatic hernia without obstruction or                            gangrene                           K31.89, Other diseases of stomach and duodenum                           K31.819, Angiodysplasia of stomach and duodenum                            without bleeding                           K55.20, Angiodysplasia of colon without hemorrhage                           D50.9, Iron deficiency anemia, unspecified CPT copyright 2019 American Medical Association. All rights reserved. The codes documented in this report are preliminary and upon coder review may  be revised to meet current compliance requirements. Maylon Peppers, MD Maylon Peppers,  11/21/2021 10:19:00 AM This report has been signed electronically. Number of Addenda: 0

## 2021-11-21 NOTE — Anesthesia Preprocedure Evaluation (Signed)
Anesthesia Evaluation  Patient identified by MRN, date of birth, ID band Patient awake    Reviewed: Allergy & Precautions, NPO status , Patient's Chart, lab work & pertinent test results  Airway Mallampati: II  TM Distance: >3 FB Neck ROM: Full    Dental  (+) Upper Dentures, Lower Dentures   Pulmonary neg pulmonary ROS, former smoker,    Pulmonary exam normal breath sounds clear to auscultation       Cardiovascular Exercise Tolerance: Good hypertension, Pt. on medications + dysrhythmias Atrial Fibrillation + pacemaker  Rhythm:Regular Rate:Normal + Systolic murmurs 1. LV function is vigorous with near cavity obliteration during systole .  Left ventricular ejection fraction, by estimation, is 70 to 75%. The left  ventricle has hyperdynamic function. The left ventricle has no regional  wall motion abnormalities. There  is severe left ventricular hypertrophy. Left ventricular diastolic  parameters are consistent with Grade I diastolic dysfunction (impaired  relaxation). Elevated left atrial pressure.  2. Right ventricular systolic function is normal. The right ventricular  size is normal. There is normal pulmonary artery systolic pressure.  3. Left atrial size was mild to moderately dilated.  4. Lead seen in RA. Right atrial size was mildly dilated.  5. Trivial mitral valve regurgitation.  6. AV is thickened, calcified with restricted motion Peak and mean  gradients through the valve are 19 and 9 mm Hg respectively. Dimensionless  index is 0.53 Overall consistent with mild AS. Marland Kitchen The aortic valve is  tricuspid. Aortic valve regurgitation is  not visualized.  7. The inferior vena cava is normal in size with greater than 50%  respiratory variability, suggesting right atrial pressure of 3 mmHg.    Neuro/Psych  Headaches, negative psych ROS   GI/Hepatic Neg liver ROS, GERD  Medicated and Controlled,  Endo/Other  negative  endocrine ROS  Renal/GU Renal InsufficiencyRenal disease  negative genitourinary   Musculoskeletal  (+) Arthritis , Osteoarthritis,    Abdominal   Peds negative pediatric ROS (+)  Hematology  (+) Blood dyscrasia, anemia ,   Anesthesia Other Findings   Reproductive/Obstetrics negative OB ROS                            Anesthesia Physical  Anesthesia Plan  ASA: 3  Anesthesia Plan: General   Post-op Pain Management: Minimal or no pain anticipated   Induction: Intravenous  PONV Risk Score and Plan: 1  Airway Management Planned: Nasal Cannula and Natural Airway  Additional Equipment:   Intra-op Plan:   Post-operative Plan:   Informed Consent: I have reviewed the patients History and Physical, chart, labs and discussed the procedure including the risks, benefits and alternatives for the proposed anesthesia with the patient or authorized representative who has indicated his/her understanding and acceptance.       Plan Discussed with: CRNA and Surgeon  Anesthesia Plan Comments:         Anesthesia Quick Evaluation

## 2021-11-21 NOTE — Anesthesia Postprocedure Evaluation (Signed)
Anesthesia Post Note  Patient: Derrick Long  Procedure(s) Performed: ESOPHAGOGASTRODUODENOSCOPY (EGD) WITH PROPOFOL PUSH ENTEROSCOPY HOT HEMOSTASIS (ARGON PLASMA COAGULATION/BICAP)  Patient location during evaluation: Phase II Anesthesia Type: General Level of consciousness: awake and alert and oriented Pain management: pain level controlled Vital Signs Assessment: post-procedure vital signs reviewed and stable Respiratory status: spontaneous breathing, nonlabored ventilation and respiratory function stable Cardiovascular status: blood pressure returned to baseline and stable Postop Assessment: no apparent nausea or vomiting Anesthetic complications: no   No notable events documented.   Last Vitals:  Vitals:   11/21/21 0842 11/21/21 1014  BP: (!) 194/73 (!) 106/54  Pulse: 63 77  Resp: (!) 21 16  Temp: 37.4 C 37.2 C  SpO2: 97% 97%    Last Pain:  Vitals:   11/21/21 1014  TempSrc: Oral  PainSc: 0-No pain                 Kaydan Wilhoite C Secily Walthour

## 2021-11-21 NOTE — Transfer of Care (Signed)
Immediate Anesthesia Transfer of Care Note  Patient: Derrick Long  Procedure(s) Performed: ESOPHAGOGASTRODUODENOSCOPY (EGD) WITH PROPOFOL PUSH ENTEROSCOPY HOT HEMOSTASIS (ARGON PLASMA COAGULATION/BICAP)  Patient Location: Short Stay  Anesthesia Type:General  Level of Consciousness: drowsy  Airway & Oxygen Therapy: Patient Spontanous Breathing  Post-op Assessment: Report given to RN and Post -op Vital signs reviewed and stable  Post vital signs: Reviewed and stable  Last Vitals:  Vitals Value Taken Time  BP 106/54 11/21/21 1014  Temp 37.2 C 11/21/21 1014  Pulse 77 11/21/21 1014  Resp 16 11/21/21 1014  SpO2 97 % 11/21/21 1014    Last Pain:  Vitals:   11/21/21 1014  TempSrc: Oral  PainSc: 0-No pain      Patients Stated Pain Goal: 8 (47/53/39 1792)  Complications: No notable events documented.

## 2021-11-21 NOTE — Discharge Instructions (Signed)
You are being discharged to home.  Resume your previous diet.  - Continue oral iron and pantoprazole daily. - Await pathology results.  - Will hold off on colonsocopy at this moment. - Restart Eliquis tonight.

## 2021-11-24 LAB — SURGICAL PATHOLOGY

## 2021-11-27 ENCOUNTER — Encounter (HOSPITAL_COMMUNITY): Payer: Self-pay | Admitting: Gastroenterology

## 2021-12-03 ENCOUNTER — Inpatient Hospital Stay: Payer: Medicare Other | Attending: Hematology

## 2021-12-03 DIAGNOSIS — M8580 Other specified disorders of bone density and structure, unspecified site: Secondary | ICD-10-CM

## 2021-12-03 DIAGNOSIS — R5383 Other fatigue: Secondary | ICD-10-CM | POA: Diagnosis not present

## 2021-12-03 DIAGNOSIS — I951 Orthostatic hypotension: Secondary | ICD-10-CM | POA: Diagnosis not present

## 2021-12-03 DIAGNOSIS — C7971 Secondary malignant neoplasm of right adrenal gland: Secondary | ICD-10-CM | POA: Diagnosis not present

## 2021-12-03 DIAGNOSIS — C61 Malignant neoplasm of prostate: Secondary | ICD-10-CM | POA: Diagnosis present

## 2021-12-03 DIAGNOSIS — Z79899 Other long term (current) drug therapy: Secondary | ICD-10-CM | POA: Insufficient documentation

## 2021-12-03 DIAGNOSIS — G9589 Other specified diseases of spinal cord: Secondary | ICD-10-CM | POA: Insufficient documentation

## 2021-12-03 LAB — COMPREHENSIVE METABOLIC PANEL
ALT: 18 U/L (ref 0–44)
AST: 22 U/L (ref 15–41)
Albumin: 3.2 g/dL — ABNORMAL LOW (ref 3.5–5.0)
Alkaline Phosphatase: 56 U/L (ref 38–126)
Anion gap: 5 (ref 5–15)
BUN: 20 mg/dL (ref 8–23)
CO2: 20 mmol/L — ABNORMAL LOW (ref 22–32)
Calcium: 8.8 mg/dL — ABNORMAL LOW (ref 8.9–10.3)
Chloride: 109 mmol/L (ref 98–111)
Creatinine, Ser: 1.2 mg/dL (ref 0.61–1.24)
GFR, Estimated: 56 mL/min — ABNORMAL LOW (ref >60)
Glucose, Bld: 107 mg/dL — ABNORMAL HIGH (ref 70–99)
Potassium: 3.8 mmol/L (ref 3.5–5.1)
Sodium: 134 mmol/L — ABNORMAL LOW (ref 135–145)
Total Bilirubin: 1 mg/dL (ref 0.3–1.2)
Total Protein: 6.7 g/dL (ref 6.5–8.1)

## 2021-12-03 LAB — CBC WITH DIFFERENTIAL/PLATELET
Abs Immature Granulocytes: 0.02 10*3/uL (ref 0.00–0.07)
Basophils Absolute: 0 10*3/uL (ref 0.0–0.1)
Basophils Relative: 1 %
Eosinophils Absolute: 0.1 10*3/uL (ref 0.0–0.5)
Eosinophils Relative: 2 %
HCT: 35.2 % — ABNORMAL LOW (ref 39.0–52.0)
Hemoglobin: 11.9 g/dL — ABNORMAL LOW (ref 13.0–17.0)
Immature Granulocytes: 1 %
Lymphocytes Relative: 34 %
Lymphs Abs: 1.5 10*3/uL (ref 0.7–4.0)
MCH: 34.7 pg — ABNORMAL HIGH (ref 26.0–34.0)
MCHC: 33.8 g/dL (ref 30.0–36.0)
MCV: 102.6 fL — ABNORMAL HIGH (ref 80.0–100.0)
Monocytes Absolute: 0.4 10*3/uL (ref 0.1–1.0)
Monocytes Relative: 10 %
Neutro Abs: 2.3 10*3/uL (ref 1.7–7.7)
Neutrophils Relative %: 52 %
Platelets: 209 10*3/uL (ref 150–400)
RBC: 3.43 MIL/uL — ABNORMAL LOW (ref 4.22–5.81)
RDW: 13 % (ref 11.5–15.5)
WBC: 4.3 10*3/uL (ref 4.0–10.5)
nRBC: 0 % (ref 0.0–0.2)

## 2021-12-03 LAB — MAGNESIUM: Magnesium: 1.8 mg/dL (ref 1.7–2.4)

## 2021-12-03 LAB — PSA: Prostatic Specific Antigen: 1.31 ng/mL (ref 0.00–4.00)

## 2021-12-04 ENCOUNTER — Inpatient Hospital Stay: Payer: Medicare Other

## 2021-12-04 LAB — VITAMIN D 25 HYDROXY (VIT D DEFICIENCY, FRACTURES)

## 2021-12-09 ENCOUNTER — Other Ambulatory Visit (HOSPITAL_COMMUNITY): Payer: Self-pay

## 2021-12-11 ENCOUNTER — Inpatient Hospital Stay (HOSPITAL_BASED_OUTPATIENT_CLINIC_OR_DEPARTMENT_OTHER): Payer: Medicare Other | Admitting: Hematology

## 2021-12-11 ENCOUNTER — Other Ambulatory Visit (HOSPITAL_COMMUNITY): Payer: Self-pay

## 2021-12-11 VITALS — BP 169/88 | HR 68 | Temp 98.1°F | Resp 16 | Wt 177.9 lb

## 2021-12-11 DIAGNOSIS — C61 Malignant neoplasm of prostate: Secondary | ICD-10-CM

## 2021-12-11 NOTE — Progress Notes (Signed)
Derrick Long, [place] [id number]   CLINIC:  Medical Oncology/Hematology  PCP:  [facility], [place] / [place] [address] [phone]   REASON FOR VISIT:  Follow-up for metastatic prostate cancer to the right adrenal gland  PRIOR THERAPY: none  NGS Results: not done  CURRENT THERAPY: Lupron every 6 months  BRIEF ONCOLOGIC HISTORY:  Oncology History   No history exists.    CANCER STAGING:  Cancer Staging  Prostate cancer [facility]) Staging form: Prostate, AJCC 8th Edition - Clinical stage from 07/10/2020: Stage IVB (cTX, cNX, pM1c, PSA: 10.2) - Unsigned   INTERVAL HISTORY:  [name], a 88 y.o. male, seen for follow-up of metastatic prostate cancer.  He is tolerating darolutamide 300 mg twice daily very well.  Energy levels are 70%.  Since his last visit, he was hospitalized for syncopal episode.  This was thought to be from orthostatic hypotension.  He is accompanied by his 2 daughters today.  No falls in the last 2 months.  REVIEW OF SYSTEMS:  Review of Systems  Constitutional:  Negative for appetite change and fatigue.  Musculoskeletal:  Negative for back pain.  Neurological:  Positive for dizziness.  Psychiatric/Behavioral:  Negative for depression and sleep disturbance.   All other systems reviewed and are negative.   PAST MEDICAL/SURGICAL HISTORY:  Past Medical History:  Diagnosis Date   Acid reflux    Acute colitis 09/16/2019   AKI (acute kidney injury) ([place]) 07/31/2012   Anemia    BENIGN PROSTATIC HYPERTROPHY, WITH OBSTRUCTION 12/18/2005   Qualifier: Diagnosis of  By: Oleta Mouse     Chest pain 07/31/2012   Colitis 09/16/2019   Colon cancer ([place])    per patient, colon cancer more than 10 years ago s/p right hemicolectomy   Elevated bilirubin    Elevated LFTs    Ex-cigarette smoker    Gout    Hyperlipemia    Hypertension    Hyponatremia 08/01/2021   Intractable abdominal pain 02/03/2021    LATERAL MENISCUS TEAR, RIGHT 04/19/2006   Qualifier: Diagnosis of  By: Jonna Munro MD, [place]     Leukocytosis, unspecified 07/31/2012   Paroxysmal atrial fibrillation ([place]) 2014   Presence of permanent cardiac pacemaker    Prostate cancer ([place]) 08/08/2013   SEEDS 20 YEARS AGO   Prostate disease    RENAL CALCULUS 12/18/2005   Qualifier: Diagnosis of  By: Oleta Mouse     RUQ pain    UTI (urinary tract infection) 03/26/2021   Past Surgical History:  Procedure Laterality Date   BIOPSY  02/05/2021   Procedure: BIOPSY;  Surgeon: Harvel Quale, MD;  [facility];  Service: Gastroenterology;;   BIOPSY  08/02/2021   Procedure: BIOPSY;  Surgeon: Daneil Dolin, MD;  [facility];  Service: Endoscopy;;   COLON SURGERY     Right hemicolectomy in the setting of colon cancer, per patient   CYSTOSCOPY WITH INSERTION OF UROLIFT N/A 05/19/2021   Procedure: CYSTOSCOPY WITH INSERTION OF UROLIFT;  Surgeon: Cleon Gustin, MD;  Location: [facility];  Service: Urology;  Laterality: N/A;   ENTEROSCOPY N/A 11/21/2021   Procedure: PUSH ENTEROSCOPY;  Surgeon: Harvel Quale, MD;  [facility];  Service: Gastroenterology;  Laterality: N/A;   ESOPHAGOGASTRODUODENOSCOPY (EGD) WITH PROPOFOL N/A 02/05/2021   Procedure: ESOPHAGOGASTRODUODENOSCOPY (EGD) WITH PROPOFOL;  Surgeon: Harvel Quale, MD;  [facility];  Service: Gastroenterology;  Laterality: N/A;  ESOPHAGOGASTRODUODENOSCOPY (EGD) WITH PROPOFOL N/A 08/02/2021   Procedure: ESOPHAGOGASTRODUODENOSCOPY (EGD) WITH PROPOFOL;  Surgeon: Daneil Dolin, MD;  [facility];  Service: Endoscopy;  Laterality: N/A;   ESOPHAGOGASTRODUODENOSCOPY (EGD) WITH PROPOFOL N/A 11/21/2021   Procedure: ESOPHAGOGASTRODUODENOSCOPY (EGD) WITH PROPOFOL;  Surgeon: Harvel Quale, MD;  [facility];  Service: Gastroenterology;  Laterality: N/A;  1200  ASA 3   HOT HEMOSTASIS   08/02/2021   Procedure: HOT HEMOSTASIS (ARGON PLASMA COAGULATION/BICAP);  Surgeon: Daneil Dolin, MD;  [facility];  Service: Endoscopy;;   HOT HEMOSTASIS  11/21/2021   Procedure: HOT HEMOSTASIS (ARGON PLASMA COAGULATION/BICAP);  Surgeon: Montez Morita, Quillian Quince, MD;  [facility];  Service: Gastroenterology;;   LEFT HEART CATHETERIZATION WITH CORONARY ANGIOGRAM N/A 07/21/2012   Procedure: LEFT HEART CATHETERIZATION WITH CORONARY ANGIOGRAM;  Surgeon: Troy Sine, MD;  Location: [facility] C[facility] LAB;  Service: Cardiovascular;  Laterality: N/A;   PACEMAKER INSERTION     PERMANENT PACEMAKER INSERTION N/A 07/22/2012   Procedure: PERMANENT PACEMAKER INSERTION;  Surgeon: Sanda Klein, MD;  Location: [place] CATH LAB;  Service: Cardiovascular;  Laterality: N/A;   PROSTATE SURGERY      SOCIAL HISTORY:  Social History   Socioeconomic History   Marital status: Married    Spouse name: Not on file   Number of children: Not on file   Years of education: Not on file   Highest education level: Not on file  Occupational History   Not on file  Tobacco Use   Smoking status: Former    Types: Cigarettes    Quit date: 07/20/2002    Years since quitting: 19.4   Smokeless tobacco: Former    Types: Nurse, children's Use: Never used  Substance and Sexual Activity   Alcohol use: No   Drug use: No   Sexual activity: Not on file  Other Topics Concern   Not on file  Social History Narrative   Not on file   Social Determinants of Health   Financial Resource Strain: Low Risk  (05/16/2020)   Overall Financial Resource Strain (CARDIA)    Difficulty of Paying Living Expenses: Not hard at all  Food Insecurity: No Food Insecurity (05/16/2020)   Hunger Vital Sign    Worried About Running Out of Food in the Last Year: Never true    [place] in the Last Year: Never true  Transportation Needs: No Transportation Needs (05/16/2020)   PRAPARE - Radiographer, therapeutic (Medical): No    Lack of Transportation (Non-Medical): No  Physical Activity: Insufficiently Active (05/16/2020)   Exercise Vital Sign    Days of Exercise per Week: 3 days    Minutes of Exercise per Session: 30 min  Stress: No Stress Concern Present (05/16/2020)   [place]    Feeling of Stress : Not at all  Social Connections: Moderately Integrated (05/16/2020)   Social Connection and Isolation Panel [NHANES]    Frequency of Communication with Friends and Family: More than three times a week    Frequency of Social Gatherings with Friends and Family: More than three times a week    Attends Religious Services: More than 4 times per year    Active Member of Clubs or Organizations: No    Attends Archivist Meetings: 1 to 4 times per year    Marital Status: Widowed  Intimate Partner Violence: Not At  Risk (05/16/2020)   Humiliation, Afraid, Rape, and Kick questionnaire    Fear of Current or Ex-Partner: No    Emotionally Abused: No    Physically Abused: No    Sexually Abused: No    FAMILY HISTORY:  Family History  Problem Relation Age of Onset   Cancer Neg Hx     CURRENT MEDICATIONS:  Current Outpatient Medications  Medication Sig Dispense Refill   acetaminophen (TYLENOL) 650 MG CR tablet Take 1,300 mg by mouth every 8 (eight) hours as needed for pain.     carvedilol (COREG) 3.125 MG tablet Take 3.125 mg by mouth 2 (two) times daily with a meal.     cefadroxil (DURICEF) 500 MG capsule Take 1 capsule (500 mg total) by mouth 2 (two) times daily. 14 capsule 0   Cholecalciferol 25 MCG (1000 UT) tablet Take 1,000 Units by mouth daily.     clotrimazole-betamethasone (LOTRISONE) cream Apply 1 application. topically 2 (two) times daily. 30 g 0   darolutamide (NUBEQA) 300 MG tablet Take 1 tablet (300 mg total) by mouth 2 (two) times daily with a meal. 60 tablet 0   diclofenac sodium (VOLTAREN) 1 % GEL  Apply 2 g topically 4 (four) times daily. 100 g 0   Ensure (ENSURE) Take 237 mLs by mouth daily as needed (nutrition). Vanilla or chocolate     ferrous sulfate 324 MG TBEC Take 324 mg by mouth 2 (two) times daily.     furosemide (LASIX) 20 MG tablet TAKE ONE TABLET BY MOUTH EVERY OTHER DAY FOR ACCUMULATION OF FLUID     megestrol (MEGACE) 400 MG/10ML suspension Take 10 mLs (400 mg total) by mouth 2 (two) times daily. 480 mL 2   pantoprazole (PROTONIX) 40 MG tablet Take 1 tablet (40 mg total) by mouth daily. 30 tablet 3   predniSONE (DELTASONE) 5 MG tablet Take 1 tablet (5 mg total) by mouth every morning. Continuous 30 tablet 3   promethazine (PHENERGAN) 6.25 MG/5ML syrup TAKE 1 TEASPOONFUL BY MOUTH EVERY 8 HOURS AS NEEDED FOR NAUSEA     rivaroxaban (XARELTO) 20 MG TABS tablet Take 1 tablet (20 mg total) by mouth daily with supper. 20 tablet 0   tamsulosin (FLOMAX) 0.4 MG CAPS capsule Take 1 capsule (0.4 mg total) by mouth daily after supper. 30 capsule 11   traMADol (ULTRAM) 50 MG tablet Take 1 tablet (50 mg total) by mouth every 12 (twelve) hours as needed. (Patient taking differently: Take 50 mg by mouth every 12 (twelve) hours as needed for moderate pain or severe pain.) 20 tablet 0   No current facility-administered medications for this visit.   Facility-Administered Medications Ordered in Other Visits  Medication Dose Route Frequency Provider Last Rate Last Admin   octreotide (SANDOSTATIN LAR) 20 MG IM injection             ALLERGIES:  No Known Allergies  PHYSICAL EXAM:  Performance status (ECOG): 1 - Symptomatic but completely ambulatory  Vitals:   12/11/21 1148  BP: (!) 169/88  Pulse: 68  Resp: 16  Temp: 98.1 F (36.7 C)  SpO2: 96%   Wt Readings from Last 3 Encounters:  12/11/21 177 lb 14.6 oz (80.7 kg)  11/19/21 186 lb (84.4 kg)  10/29/21 186 lb (84.4 kg)   Physical Exam Vitals reviewed.  Constitutional:      Appearance: Normal appearance.     Comments: In  wheelchair  Cardiovascular:     Rate and Rhythm: Normal rate and regular rhythm.  Pulses: Normal pulses.     Heart sounds: Normal heart sounds.  Pulmonary:     Effort: Pulmonary effort is normal.     Breath sounds: Normal breath sounds.  Musculoskeletal:     Right lower leg: No edema.     Left lower leg: No edema.  Neurological:     General: No focal deficit present.     Mental Status: He is alert and oriented to person, place, and time.  Psychiatric:        Mood and Affect: Mood normal.        Behavior: Behavior normal.      LABORATORY DATA:  I have reviewed the labs as listed.     Latest Ref Rng & Units 12/03/2021    9:20 AM 11/19/2021   11:15 AM 10/29/2021   10:07 AM  CBC  WBC 4.0 - 10.5 K/uL 4.3  3.5  7.4   Hemoglobin 13.0 - 17.0 g/dL 11.9  11.6  9.7   Hematocrit 39.0 - 52.0 % 35.2  34.3  29.7   Platelets 150 - 400 K/uL 209  252  194       Latest Ref Rng & Units 12/03/2021    9:20 AM 10/29/2021   10:07 AM 10/25/2021    8:21 PM  CMP  Glucose 70 - 99 mg/dL 107  94  129   BUN 8 - 23 mg/dL '20  15  21   '$ Creatinine 0.61 - 1.24 mg/dL 1.20  1.04  1.37   Sodium 135 - 145 mmol/L 134  135  137   Potassium 3.5 - 5.1 mmol/L 3.8  3.9  3.7   Chloride 98 - 111 mmol/L 109  112  113   CO2 22 - 32 mmol/L '20  21  18   '$ Calcium 8.9 - 10.3 mg/dL 8.8  8.6  8.5   Total Protein 6.5 - 8.1 g/dL 6.7     Total Bilirubin 0.3 - 1.2 mg/dL 1.0     Alkaline Phos 38 - 126 U/L 56     AST 15 - 41 U/L 22     ALT 0 - 44 U/L 18       DIAGNOSTIC IMAGING:  I have independently reviewed the scans and discussed with the patient. No results found.   ASSESSMENT:  1.  Metastatic prostate cancer to the right adrenal gland: -Presentation to the ER with worsening right-sided back pain, which has been on and off for the last couple of years. - Some decrease in appetite with 6 pound weight loss in the last 6 months.  No hematuria or other B symptoms. - CT renal study on 05/03/2020 showed new right adrenal  mass with bilateral renal lesions.  Indeterminate L2 vertebral bodies sclerotic lesion.  Low-attenuation lesions in the kidneys measure up to 2.3 cm on the right and were similar to scan from July 2021. - CT CAP on 06/12/2020 did not show any evidence of nodal metastasis or metastatic disease in the chest.  Right adrenal mass measures 4.2 x 3.8 cm exerts mass-effect upon the posterior and right lateral wall of the IVC.  Right kidney masses measuring 2.3 x 2.3 cm and 1.9 x 1.7 cm suspicious for RCC.  Upper pole of the left kidney lesion 2.2 x 2.0 cm and medial cortex of the inferior pole measuring 2 x 2 cm and another interpolar left kidney lesion measuring 1.3 x 1.5 cm. - Bone scan on 06/13/2020 was negative. - Right adrenal biopsy consistent with prostatic adenocarcinoma -  Degarelix on 07/16/2020, transition to Eligard 45 mg on 08/21/2020. - Abiraterone 100 mg daily/prednisone 5 mg daily started around 01/22/2021.,  Discontinued in April 2023.  Not a candidate for Erleada due to blood thinner. - Darolutamide 300 mg twice daily started on 06/19/2021, increased to 600 mg twice daily on 06/26/2021, cut back to 300 mg twice daily on 07/07/2021 due to decreased oral intake and tiredness.   2.  Social/family history: - He lives at home with his daughter.  He walks with a cane. - He mows lawn with a riding mower.  He has a garden and gross vegetables.  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  Quit smoking 25 years ago. - No family history of malignancies that he is aware of.   PLAN:  1.  Metastatic prostate cancer to the right adrenal gland: - He is taking darolutamide 300 mg twice daily. - He denies any falls in the last 2 months. - Labs reviewed by me shows normal LFTs.  CBC was grossly normal.  Vitamin D level was 38. - PSA has improved to 1.31 from 2.27. - Last Eligard 45 mg was on 08/26/2021. - Recommend continuing darolutamide 300 mg twice daily.  RTC 3 months for follow-up with repeat labs and  PSA.  2.  Right sided back pain: - Continue tramadol in the mornings as needed.  Is not requiring daily.  3.  Hypomagnesemia: - Continue magnesium 3 times daily.  Magnesium is normal today.  4.  Decreased appetite: - He is not requiring Megace.  He is eating well.   Orders placed this encounter:  Orders Placed This Encounter  Procedures   CBC with Differential   Comprehensive metabolic panel   Magnesium   PSA      Derek Jack, MD [place] [phone]

## 2021-12-11 NOTE — Patient Instructions (Signed)
[place] [facility] Discharge Instructions   You were seen and examined today by Dr. Delton Coombes.  He reviewed the results of your labs which are normal/stable.   Continue Nubeqa as prescribed.   We will see you back in 3 months.   Thank you for choosing [place] [facility] to provide your oncology and hematology care.  To afford each patient quality time with our provider, please arrive at least 15 minutes before your scheduled appointment time.   If you have a lab appointment with the [facility] please come in thru the Main Entrance and check in at the main information desk.  You need to re-schedule your appointment should you arrive 10 or more minutes late.  We strive to give you quality time with our providers, and arriving late affects you and other patients whose appointments are after yours.  Also, if you no show three or more times for appointments you may be dismissed from the clinic at the providers discretion.     Again, thank you for choosing [facility].  Our hope is that these requests will decrease the amount of time that you wait before being seen by our physicians.       _____________________________________________________________  Should you have questions after your visit to [facility], please contact our office at [phone] and follow the prompts.  Our office hours are 8:00 a.m. and 4:30 p.m. Monday - Friday.  Please note that voicemails left after 4:00 p.m. may not be returned until the following business day.  We are closed weekends and major holidays.  You do have access to a nurse 24-7, just call the main number to the clinic [phone] and do not press any options, hold on the line and a nurse will answer the phone.    For prescription refill requests, have your pharmacy contact our office and allow 72 hours.    Due to Covid, you will need to wear a mask upon entering the  hospital. If you do not have a mask, a mask will be given to you at the Main Entrance upon arrival. For doctor visits, patients may have 1 support person age 66 or older with them. For treatment visits, patients can not have anyone with them due to social distancing guidelines and our immunocompromised population.

## 2021-12-11 NOTE — Progress Notes (Signed)
Patient is taking Nubeqa as prescribed.  She has not missed any doses and reports no side effects at this time.

## 2021-12-16 ENCOUNTER — Other Ambulatory Visit (HOSPITAL_COMMUNITY): Payer: Self-pay

## 2021-12-16 ENCOUNTER — Other Ambulatory Visit (HOSPITAL_COMMUNITY): Payer: Self-pay | Admitting: Hematology

## 2021-12-16 DIAGNOSIS — C61 Malignant neoplasm of prostate: Secondary | ICD-10-CM

## 2021-12-16 MED ORDER — NUBEQA 300 MG PO TABS
300.0000 mg | ORAL_TABLET | Freq: Two times a day (BID) | ORAL | 0 refills | Status: DC
  Filled 2021-12-16: qty 60, 30d supply, fill #0

## 2021-12-17 ENCOUNTER — Other Ambulatory Visit (HOSPITAL_COMMUNITY): Payer: Self-pay

## 2021-12-18 ENCOUNTER — Other Ambulatory Visit (HOSPITAL_COMMUNITY): Payer: Self-pay

## 2021-12-19 ENCOUNTER — Ambulatory Visit: Payer: Medicare Other | Admitting: Surgical

## 2021-12-19 ENCOUNTER — Ambulatory Visit: Payer: Self-pay

## 2021-12-19 DIAGNOSIS — M19011 Primary osteoarthritis, right shoulder: Secondary | ICD-10-CM

## 2021-12-19 DIAGNOSIS — M19012 Primary osteoarthritis, left shoulder: Secondary | ICD-10-CM

## 2021-12-19 MED ORDER — TRAMADOL HCL 50 MG PO TABS: 50.0000 mg | ORAL_TABLET | Freq: Two times a day (BID) | ORAL | 0 refills | Status: DC | PRN

## 2021-12-21 ENCOUNTER — Encounter: Payer: Self-pay | Admitting: Surgical

## 2021-12-21 MED ORDER — METHYLPREDNISOLONE ACETATE 40 MG/ML IJ SUSP
40.0000 mg | INTRAMUSCULAR | Status: AC | PRN
  Administered 2021-12-19: 40 mg via INTRA_ARTICULAR

## 2021-12-21 MED ORDER — BUPIVACAINE HCL 0.5 % IJ SOLN
9.0000 mL | INTRAMUSCULAR | Status: AC | PRN
  Administered 2021-12-19: 9 mL via INTRA_ARTICULAR

## 2021-12-21 NOTE — Progress Notes (Signed)
Office Visit Note   Patient: Derrick Long           Date of Birth: March 11, 1926           MRN: [id number] Visit Date: 12/19/2021 Requested by: Center, [facility] [address] [place],  [place] [address] PCP: [place]  Subjective: Chief Complaint  Patient presents with   Right Shoulder - Pain    HPI: Derrick Long is a 88 y.o. male who presents to the office reporting left shoulder pain.  He was originally complaining of right shoulder pain but that actually his left shoulder that is bothering him.  No new injury or fall.  States that it bothers him from the lateral aspect of the shoulder radiating down to the mid humeral region.  Its been worse in the last 2 weeks and specifically in the last week since the weather has gotten colder.  Complains of decreased range of motion and difficulty lifting his arm at times due to pain.  Cannot really sleep on his left side.  He has had previous injections into the left shoulder with good relief and would like to repeat this today.  Takes occasional tramadol to help sleep at night..                ROS: All systems reviewed are negative as they relate to the chief complaint within the history of present illness.  Patient denies fevers or chills.  Assessment & Plan: Visit Diagnoses:  1. Osteoarthritis of left glenohumeral joint     Plan: Patient is a 88 year old male who presents for evaluation of left shoulder pain.  States that he has had worsening pain over the last couple weeks especially since the weather has gotten colder.  No new injury.  He has history of mild left shoulder osteoarthritis with some component of rotator cuff deficiency based on examination.  Has had good relief with previous glenohumeral injection on 11/03/2021.  He would like to try another injection but this should be the last injection he has for at least 4 months.  Administered injection under ultrasound guidance into the glenohumeral joint and  immediately after injection he noted substantial symptomatic relief with passive and active range of motion of his left shoulder.  Prescription for tramadol was prescribed to help him sleep at night.  He may follow-up as needed with the office with the next soonest possible injection in late February or early March.  No sign of septic arthritis on exam today.  Follow-up with the office as needed.  Follow-Up Instructions: No follow-ups on file.   Orders:  Orders Placed This Encounter  Procedures   US Guided Needle Placement - No Linked Charges   Meds ordered this encounter  Medications   traMADol (ULTRAM) 50 MG tablet    Sig: Take 1 tablet (50 mg total) by mouth every 12 (twelve) hours as needed.    Dispense:  30 tablet    Refill:  0      Procedures: Large Joint Inj: L glenohumeral on 12/19/2021 1:07 PM Indications: diagnostic evaluation and pain Details: 22 G 1.5 in and 3.5 in needle, ultrasound-guided posterior approach  Arthrogram: No  Medications: 9 mL bupivacaine 0.5 %; 40 mg methylPREDNISolone acetate 40 MG/ML Outcome: tolerated well, no immediate complications Procedure, treatment alternatives, risks and benefits explained, specific risks discussed. Consent was given by the patient. Immediately prior to procedure a time out was called to verify the correct patient, procedure, equipment, support staff  and site/side marked as required. Patient was prepped and draped in the usual sterile fashion.       Clinical Data: No additional findings.  Objective: Vital Signs: There were no vitals taken for this visit.  Physical Exam:  Constitutional: Patient appears well-developed HEENT:  Head: Normocephalic Eyes:EOM are normal Neck: Normal range of motion Cardiovascular: Normal rate Pulmonary/chest: Effort normal Neurologic: Patient is alert Skin: Skin is warm Psychiatric: Patient has normal mood and affect  Ortho Exam: Ortho exam demonstrates left shoulder with 80 degrees  external rotation, 90 degrees abduction, 120 degrees forward flexion.  Weakness of infraspinatus and subscapularis rated 4/5.  Supraspinatus with 5 -/5 strength.  Axillary nerve is intact with deltoid firing.  There is no erythema or warmth around the joint.  He has no pain with passive range of motion until terminal X rotation, abduction, forward flexion.  He has active forward flexion to about 70 degrees.  Active abduction to 75 degrees.  Specialty Comments:  No specialty comments available.  Imaging: No results found.   PMFS History: Patient Active Problem List   Diagnosis Date Noted   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   Melena 10/16/2021   GI bleed 08/01/2021   Macrocytic anemia 08/01/2021   Mixed hyperlipidemia 08/01/2021   DNR (do not resuscitate) 03/26/2021   CKD (chronic kidney disease), stage III ([place]) 03/26/2021   Prostate cancer ([place]) 07/10/2020   Bilateral kidney masses 06/06/2020   Adrenal mass, right ([place]) 06/06/2020   Pelvic lymphadenopathy 09/16/2019   Paroxysmal atrial fibrillation ([place]) 09/02/2012   HTN (hypertension) 09/02/2012   Pacemaker 08/03/2012   Gout 07/31/2012   First degree AV block, PR interval 370 ms 07/19/2012   Chest pain, atypical 07/19/2012   Syncope 07/19/2012   Bradycardia 07/19/2012   Iron deficiency anemia due to chronic blood loss 08/16/2008   OSTEOARTHRITIS, MODERATE 09/21/2007   INSOMNIA 06/24/2007   DENTAL CARIES 10/08/2006   Migraine headache 12/18/2005   Essential hypertension 12/18/2005   GERD 12/18/2005   LOW BACK PAIN 12/18/2005   OSTEOPENIA 12/18/2005   COLON CANCER, HX OF 12/18/2005   Past Medical History:  Diagnosis Date   Acid reflux    Acute colitis 09/16/2019   AKI (acute kidney injury) ([place]) 07/31/2012   Anemia    BENIGN PROSTATIC HYPERTROPHY, WITH OBSTRUCTION 12/18/2005   Qualifier: Diagnosis of  By: Oleta Mouse     Chest pain 07/31/2012   Colitis 09/16/2019   Colon cancer ([place])    per patient, colon  cancer more than 10 years ago s/p right hemicolectomy   Elevated bilirubin    Elevated LFTs    Ex-cigarette smoker    Gout    Hyperlipemia    Hypertension    Hyponatremia 08/01/2021   Intractable abdominal pain 02/03/2021   LATERAL MENISCUS TEAR, RIGHT 04/19/2006   Qualifier: Diagnosis of  By: Jonna Munro MD, Cornelius     Leukocytosis, unspecified 07/31/2012   Paroxysmal atrial fibrillation ([place]) 2014   Presence of permanent cardiac pacemaker    Prostate cancer ([place]) 08/08/2013   SEEDS 20 YEARS AGO   Prostate disease    RENAL CALCULUS 12/18/2005   Qualifier: Diagnosis of  By: Oleta Mouse     RUQ pain    UTI (urinary tract infection) 03/26/2021    Family History  Problem Relation Age of Onset   Cancer Neg Hx     Past Surgical History:  Procedure Laterality Date   BIOPSY  02/05/2021   Procedure: BIOPSY;  Surgeon: Harvel Quale,  MD;  [facility];  Service: Gastroenterology;;   BIOPSY  08/02/2021   Procedure: BIOPSY;  Surgeon: Daneil Dolin, MD;  [facility];  Service: Endoscopy;;   COLON SURGERY     Right hemicolectomy in the setting of colon cancer, per patient   CYSTOSCOPY WITH INSERTION OF UROLIFT N/A 05/19/2021   Procedure: CYSTOSCOPY WITH INSERTION OF UROLIFT;  Surgeon: Cleon Gustin, MD;  Location: [facility];  Service: Urology;  Laterality: N/A;   ENTEROSCOPY N/A 11/21/2021   Procedure: PUSH ENTEROSCOPY;  Surgeon: Harvel Quale, MD;  [facility];  Service: Gastroenterology;  Laterality: N/A;   ESOPHAGOGASTRODUODENOSCOPY (EGD) WITH PROPOFOL N/A 02/05/2021   Procedure: ESOPHAGOGASTRODUODENOSCOPY (EGD) WITH PROPOFOL;  Surgeon: Harvel Quale, MD;  [facility];  Service: Gastroenterology;  Laterality: N/A;   ESOPHAGOGASTRODUODENOSCOPY (EGD) WITH PROPOFOL N/A 08/02/2021   Procedure: ESOPHAGOGASTRODUODENOSCOPY (EGD) WITH PROPOFOL;  Surgeon: Daneil Dolin, MD;  [facility];   Service: Endoscopy;  Laterality: N/A;   ESOPHAGOGASTRODUODENOSCOPY (EGD) WITH PROPOFOL N/A 11/21/2021   Procedure: ESOPHAGOGASTRODUODENOSCOPY (EGD) WITH PROPOFOL;  Surgeon: Harvel Quale, MD;  [facility];  Service: Gastroenterology;  Laterality: N/A;  1200  ASA 3   HOT HEMOSTASIS  08/02/2021   Procedure: HOT HEMOSTASIS (ARGON PLASMA COAGULATION/BICAP);  Surgeon: Daneil Dolin, MD;  [facility];  Service: Endoscopy;;   HOT HEMOSTASIS  11/21/2021   Procedure: HOT HEMOSTASIS (ARGON PLASMA COAGULATION/BICAP);  Surgeon: Montez Morita, Quillian Quince, MD;  [facility];  Service: Gastroenterology;;   LEFT HEART CATHETERIZATION WITH CORONARY ANGIOGRAM N/A 07/21/2012   Procedure: LEFT HEART CATHETERIZATION WITH CORONARY ANGIOGRAM;  Surgeon: Troy Sine, MD;  [facility];  Service: Cardiovascular;  Laterality: N/A;   PACEMAKER INSERTION     PERMANENT PACEMAKER INSERTION N/A 07/22/2012   Procedure: PERMANENT PACEMAKER INSERTION;  Surgeon: Sanda Klein, MD;  Location: [place] CATH LAB;  Service: Cardiovascular;  Laterality: N/A;   PROSTATE SURGERY     Social History   Occupational History   Not on file  Tobacco Use   Smoking status: Former    Types: Cigarettes    Quit date: 07/20/2002    Years since quitting: 19.4   Smokeless tobacco: Former    Types: Nurse, children's Use: Never used  Substance and Sexual Activity   Alcohol use: No   Drug use: No   Sexual activity: Not on file

## 2021-12-24 ENCOUNTER — Other Ambulatory Visit (HOSPITAL_COMMUNITY): Payer: Self-pay

## 2021-12-25 ENCOUNTER — Other Ambulatory Visit (HOSPITAL_COMMUNITY): Payer: Self-pay | Admitting: Hematology

## 2021-12-25 ENCOUNTER — Other Ambulatory Visit (HOSPITAL_COMMUNITY): Payer: Self-pay

## 2021-12-25 DIAGNOSIS — C61 Malignant neoplasm of prostate: Secondary | ICD-10-CM

## 2021-12-29 ENCOUNTER — Other Ambulatory Visit (HOSPITAL_COMMUNITY): Payer: Self-pay

## 2021-12-29 ENCOUNTER — Other Ambulatory Visit: Payer: Self-pay | Admitting: *Deleted

## 2021-12-29 DIAGNOSIS — C61 Malignant neoplasm of prostate: Secondary | ICD-10-CM

## 2021-12-29 MED ORDER — NUBEQA 300 MG PO TABS
300.0000 mg | ORAL_TABLET | Freq: Two times a day (BID) | ORAL | 0 refills | Status: DC
  Filled 2021-12-29 – 2022-02-04 (×4): qty 60, 30d supply, fill #0

## 2021-12-29 MED ORDER — NUBEQA 300 MG PO TABS: 300.0000 mg | ORAL_TABLET | Freq: Two times a day (BID) | ORAL | 0 refills | Status: DC

## 2022-01-01 ENCOUNTER — Other Ambulatory Visit (HOSPITAL_COMMUNITY): Payer: Self-pay

## 2022-01-02 ENCOUNTER — Other Ambulatory Visit (HOSPITAL_COMMUNITY): Payer: Self-pay

## 2022-01-05 ENCOUNTER — Other Ambulatory Visit (HOSPITAL_COMMUNITY): Payer: Self-pay | Admitting: Hematology

## 2022-01-16 ENCOUNTER — Other Ambulatory Visit (HOSPITAL_COMMUNITY): Payer: Self-pay

## 2022-01-19 ENCOUNTER — Other Ambulatory Visit (HOSPITAL_COMMUNITY): Payer: Self-pay

## 2022-01-19 ENCOUNTER — Encounter (INDEPENDENT_AMBULATORY_CARE_PROVIDER_SITE_OTHER): Payer: Self-pay | Admitting: Gastroenterology

## 2022-01-19 ENCOUNTER — Ambulatory Visit (INDEPENDENT_AMBULATORY_CARE_PROVIDER_SITE_OTHER): Payer: Medicare Other | Admitting: Gastroenterology

## 2022-01-19 VITALS — BP 161/85 | HR 80 | Temp 97.9°F | Ht 73.0 in | Wt 181.4 lb

## 2022-01-19 DIAGNOSIS — D500 Iron deficiency anemia secondary to blood loss (chronic): Secondary | ICD-10-CM

## 2022-01-19 DIAGNOSIS — I998 Other disorder of circulatory system: Secondary | ICD-10-CM | POA: Diagnosis not present

## 2022-01-19 DIAGNOSIS — K31819 Angiodysplasia of stomach and duodenum without bleeding: Secondary | ICD-10-CM

## 2022-01-19 MED ORDER — PANTOPRAZOLE SODIUM 40 MG PO TBEC: 40.0000 mg | DELAYED_RELEASE_TABLET | Freq: Every day | ORAL | 3 refills | Status: DC

## 2022-01-19 NOTE — Addendum Note (Signed)
Addended by: Carmelina Noun on: 01/19/2022 04:40 PM   Modules accepted: Orders

## 2022-01-19 NOTE — Patient Instructions (Signed)
We will recheck iron levels today Please continue with daily iron pill and protonix '40mg'$  once a day, please let me know once you get back home if you do not have the protonix and I will send a refill Let me know if you have any rectal bleeding, black stools, you are feeling short of breath, dizzy or have any fainting episodes  Follow up 6 months

## 2022-01-19 NOTE — Progress Notes (Signed)
Referring Provider: Center, [place] Physician:  [place] Primary GI Physician: Jenetta Downer   Chief Complaint  Patient presents with   Gastroesophageal Reflux    3 month follow up. Arrives with daughter Derrick Long. Reports he is doing well with GERD and has not been taking protonix. Takes iron once daily. Has not noticed any dark stools. Reports appetite is good. Drinks one ensure a day.    HPI:   Derrick Long is a 88 y.o. male with past medical history of GERD, anemia, BPH, gout, HTN, Hyperlipemia, a fib on Xarelto, metastatic prostate cancer, colon cancer.    Patient presenting today for follow up of melena, IDA  Last seen August 2023, at that time recent FOBT positive and presence of melena. Recent EGD as below, recommended to have push enteroscopy, continue daily PPI, iron BID.  Small bowel endoscopy as outlined below, colonoscopy not recommended at that time.   Most recent hgb in October was 11.9  Present:  Patient states that he has had no further melena or rectal bleeding. He denies any dizziness, sob or fatigue. He has no abdominal pain, no constipation or diarrhea. Appetite is good, weight is stable. He is having a BM daily with no issue. His daughter does not think he is taking protonix but she is not sure. No GI complaints today, feels that he is doing well since procedure in October.   Small bowel endoscopy: 11/2021 - 1 cm hiatal hernia. - Dark pigementation mucosa in the cardia. Biopsied.(mild reactive reparative changes at the squamocolumnar junction but no other abnormalities)  - Erosive gastropathy with no stigmata of recent bleeding. - A single non-bleeding angiodysplastic lesion in the stomach. Treated with argon plasma coagulation (APC). - Normal examined duodenum.- Two non-bleeding angioectasias in the jejunum. Treated with argon plasma coagulation(APC). Last Colonoscopy:several years ago at [facility]  Endoscopy:08/02/21-Normal esophagus. Inflamed appearing gastric mucosa. 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    Past Medical History:  Diagnosis Date   Acid reflux    Acute colitis 09/16/2019   AKI (acute kidney injury) ([place]) 07/31/2012   Anemia    BENIGN PROSTATIC HYPERTROPHY, WITH OBSTRUCTION 12/18/2005   Qualifier: Diagnosis of  By: Oleta Mouse     Chest pain 07/31/2012   Colitis 09/16/2019   Colon cancer ([place])    per patient, colon cancer more than 10 years ago s/p right hemicolectomy   Elevated bilirubin    Elevated LFTs    Ex-cigarette smoker    Gout    Hyperlipemia    Hypertension    Hyponatremia 08/01/2021   Intractable abdominal pain 02/03/2021   LATERAL MENISCUS TEAR, RIGHT 04/19/2006   Qualifier: Diagnosis of  By: Jonna Munro MD, Cornelius     Leukocytosis, unspecified 07/31/2012   Paroxysmal atrial fibrillation ([place]) 2014   Presence of permanent cardiac pacemaker    Prostate cancer ([place]) 08/08/2013   SEEDS 20 YEARS AGO   Prostate disease    RENAL CALCULUS 12/18/2005   Qualifier: Diagnosis of  By: Oleta Mouse     RUQ pain    UTI (urinary tract infection) 03/26/2021    Past Surgical History:  Procedure Laterality Date   BIOPSY  02/05/2021   Procedure: BIOPSY;  Surgeon: Harvel Quale, MD;  [facility];  Service: Gastroenterology;;   BIOPSY  08/02/2021   Procedure: BIOPSY;  Surgeon: Daneil Dolin, MD;  [facility];  Service: Endoscopy;;   COLON SURGERY     Right hemicolectomy in the setting of colon cancer, per patient   CYSTOSCOPY WITH INSERTION OF UROLIFT N/A 05/19/2021   Procedure: CYSTOSCOPY WITH INSERTION OF UROLIFT;  Surgeon: Cleon Gustin, MD;  Location: [facility];  Service: Urology;  Laterality: N/A;   ENTEROSCOPY N/A 11/21/2021   Procedure: PUSH  ENTEROSCOPY;  Surgeon: Harvel Quale, MD;  [facility];  Service: Gastroenterology;  Laterality: N/A;   ESOPHAGOGASTRODUODENOSCOPY (EGD) WITH PROPOFOL N/A 02/05/2021   Procedure: ESOPHAGOGASTRODUODENOSCOPY (EGD) WITH PROPOFOL;  Surgeon: Harvel Quale, MD;  [facility];  Service: Gastroenterology;  Laterality: N/A;   ESOPHAGOGASTRODUODENOSCOPY (EGD) WITH PROPOFOL N/A 08/02/2021   Procedure: ESOPHAGOGASTRODUODENOSCOPY (EGD) WITH PROPOFOL;  Surgeon: Daneil Dolin, MD;  [facility];  Service: Endoscopy;  Laterality: N/A;   ESOPHAGOGASTRODUODENOSCOPY (EGD) WITH PROPOFOL N/A 11/21/2021   Procedure: ESOPHAGOGASTRODUODENOSCOPY (EGD) WITH PROPOFOL;  Surgeon: Harvel Quale, MD;  [facility];  Service: Gastroenterology;  Laterality: N/A;  1200  ASA 3   HOT HEMOSTASIS  08/02/2021   Procedure: HOT HEMOSTASIS (ARGON PLASMA COAGULATION/BICAP);  Surgeon: Daneil Dolin, MD;  [facility];  Service: Endoscopy;;   HOT HEMOSTASIS  11/21/2021   Procedure: HOT HEMOSTASIS (ARGON PLASMA COAGULATION/BICAP);  Surgeon: Montez Morita, Quillian Quince, MD;  [facility];  Service: Gastroenterology;;   LEFT HEART CATHETERIZATION WITH CORONARY ANGIOGRAM N/A 07/21/2012   Procedure: LEFT HEART CATHETERIZATION WITH CORONARY ANGIOGRAM;  Surgeon: Troy Sine, MD;  Location: [facility] CATH LAB;  Service: Cardiovascular;  Laterality: N/A;   PACEMAKER INSERTION     PERMANENT PACEMAKER INSERTION N/A 07/22/2012   Procedure: PERMANENT PACEMAKER INSERTION;  Surgeon: Sanda Klein, MD;  Location: [facility] [facility] CATH LAB;  Service: Cardiovascular;  Laterality: N/A;   PROSTATE SURGERY      Current Outpatient Medications  Medication Sig Dispense Refill   acetaminophen (TYLENOL) 650 MG CR tablet Take 1,300 mg by mouth every 8 (eight) hours as needed for pain.     carvedilol (COREG) 3.125 MG tablet Take 3.125 mg by mouth 2 (two) times daily with a meal.      clotrimazole-betamethasone (LOTRISONE) cream Apply 1 application. topically 2 (two) times daily. 30 g 0   darolutamide (NUBEQA) 300 MG tablet Take 1 tablet (300 mg total) by mouth 2 (two) times daily with a meal. 60 tablet 0   diclofenac sodium (VOLTAREN) 1 % GEL Apply 2 g topically 4 (four) times daily. 100 g 0   Ensure (ENSURE) Take 237 mLs by mouth daily as needed (nutrition). Vanilla or chocolate     ferrous sulfate 324 MG TBEC Take 324 mg by mouth daily.     furosemide (LASIX) 20 MG tablet TAKE ONE TABLET BY MOUTH EVERY OTHER DAY FOR ACCUMULATION OF FLUID     predniSONE (DELTASONE) 5 MG tablet TAKE ONE TABLET BY MOUTH ONCE DAILY WITH BREAKFAST. 30 tablet 0   rivaroxaban (XARELTO) 20 MG TABS tablet Take 1 tablet (20 mg total) by mouth daily with supper. 20 tablet 0   tamsulosin (FLOMAX) 0.4 MG CAPS capsule Take 1 capsule (0.4 mg total) by mouth daily after supper. 30 capsule 11   traMADol (ULTRAM) 50 MG tablet Take 1 tablet (50 mg total) by mouth every 12 (twelve) hours as needed. 30 tablet 0   cefadroxil (DURICEF) 500 MG capsule Take 1 capsule (500 mg total) by mouth 2 (two)  times daily. 14 capsule 0   megestrol (MEGACE) 400 MG/10ML suspension Take 10 mLs (400 mg total) by mouth 2 (two) times daily. (Patient not taking: Reported on 01/19/2022) 480 mL 2   pantoprazole (PROTONIX) 40 MG tablet Take 1 tablet (40 mg total) by mouth daily. (Patient not taking: Reported on 01/19/2022) 30 tablet 3   promethazine (PHENERGAN) 6.25 MG/5ML syrup TAKE 1 TEASPOONFUL BY MOUTH EVERY 8 HOURS AS NEEDED FOR NAUSEA (Patient not taking: Reported on 01/19/2022)     No current facility-administered medications for this visit.   Facility-Administered Medications Ordered in Other Visits  Medication Dose Route Frequency Provider Last Rate Last Admin   octreotide (SANDOSTATIN LAR) 20 MG IM injection            Allergies as of 01/19/2022   (No Known Allergies)   Family History  Problem Relation Age of Onset   Cancer  Neg Hx    Social History   Socioeconomic History   Marital status: Married    Spouse name: Not on file   Number of children: Not on file   Years of education: Not on file   Highest education level: Not on file  Occupational History   Not on file  Tobacco Use   Smoking status: Former    Types: Cigarettes    Quit date: 07/20/2002    Years since quitting: 19.5    Passive exposure: Past   Smokeless tobacco: Former    Types: Nurse, children's Use: Never used  Substance and Sexual Activity   Alcohol use: No   Drug use: No   Sexual activity: Not on file  Other Topics Concern   Not on file  Social History Narrative   Not on file   Social Determinants of Health   Financial Resource Strain: Low Risk  (05/16/2020)   Overall Financial Resource Strain (CARDIA)    Difficulty of Paying Living Expenses: Not hard at all  Food Insecurity: No Food Insecurity (05/16/2020)   Hunger Vital Sign    Worried About Running Out of Food in the Last Year: Never true    [place] in the Last Year: Never true  Transportation Needs: No Transportation Needs (05/16/2020)   PRAPARE - Hydrologist (Medical): No    Lack of Transportation (Non-Medical): No  Physical Activity: Insufficiently Active (05/16/2020)   Exercise Vital Sign    Days of Exercise per Week: 3 days    Minutes of Exercise per Session: 30 min  Stress: No Stress Concern Present (05/16/2020)   [place]    Feeling of Stress : Not at all  Social Connections: Moderately Integrated (05/16/2020)   Social Connection and Isolation Panel [NHANES]    Frequency of Communication with Friends and Family: More than three times a week    Frequency of Social Gatherings with Friends and Family: More than three times a week    Attends Religious Services: More than 4 times per year    Active Member of Clubs or Organizations: No    Attends Theatre manager Meetings: 1 to 4 times per year    Marital Status: Widowed   Review of systems General: negative for malaise, night sweats, fever, chills, weight loss Neck: Negative for lumps, goiter, pain and significant neck swelling Resp: Negative for cough, wheezing, dyspnea at rest CV: Negative for chest pain, leg swelling, palpitations, orthopnea GI: denies melena, hematochezia, nausea,  vomiting, diarrhea, constipation, dysphagia, odyonophagia, early satiety or unintentional weight loss.  MSK: Negative for joint pain or swelling, back pain, and muscle pain. Derm: Negative for itching or rash Psych: Denies depression, anxiety, memory loss, confusion. No homicidal or suicidal ideation.  Heme: Negative for prolonged bleeding, bruising easily, and swollen nodes. Endocrine: Negative for cold or heat intolerance, polyuria, polydipsia and goiter. Neuro: negative for tremor, gait imbalance, syncope and seizures. The remainder of the review of systems is noncontributory.  Physical Exam: BP (!) 161/85 (BP Location: Left Arm, Patient Position: Sitting, Cuff Size: Large)   Pulse 80   Temp 97.9 F (36.6 C) (Oral)   Ht '6\' 1"'$  (1.854 m)   Wt 181 lb 6.4 oz (82.3 kg)   BMI 23.93 kg/m  General:   Alert and oriented. No distress noted. Pleasant and cooperative.  Head:  Normocephalic and atraumatic. Eyes:  Conjuctiva clear without scleral icterus. Mouth:  Oral mucosa pink and moist. Good dentition. No lesions. Heart: Normal rate and rhythm, s1 and s2 heart sounds present.  Lungs: Clear lung sounds in all lobes. Respirations equal and unlabored. Abdomen:  +BS, soft, non-tender and non-distended. No rebound or guarding. No HSM or masses noted. Derm: No palmar erythema or jaundice Msk:  Symmetrical without gross deformities. Normal posture. Extremities:  Without edema. Neurologic:  Alert and  oriented x4 Psych:  Alert and cooperative. Normal mood and affect.  Invalid input(s): "6 MONTHS"    ASSESSMENT: AMELIA MACKEN is a 88 y.o. male presenting today for follow up of melena and IDA.  Small bowel endoscopy in October with multiple angiectasias/angiodysplastic lesions s/p APC, melena and IDA thought secondary to these lesions. No colonoscopy recommended at that time. Patient doing well today. Last hgb was 11.9 in October which appears to be around baseline for him. He has no melena or rectal bleeding, no sob, dizziness of fatigue. Appetite and weight are stable. We will recheck iron studies today. Should continue daily iron for now. Patient and daughter are unsure if he is still taking protonix, I recommended he continue this, they will make me aware if he needs a refill of this once they return home.    PLAN:  Repeat Iron studies 2. Continue PPI daily  3. Continue Iron daily  4. Pt to make me aware of melena, rectal bleeding, SOB, dizziness or syncope  All questions were answered, patient verbalized understanding and is in agreement with plan as outlined above.   Follow Up: 6 months  Alben Jepsen L. Alver Sorrow, MSN, APRN, AGNP-C Adult-Gerontology Nurse Practitioner [facility]  I have reviewed the note and agree with the APP's assessment as described in this progress note  Maylon Peppers, MD Gastroenterology and Hepatology [facility]

## 2022-01-20 ENCOUNTER — Ambulatory Visit (INDEPENDENT_AMBULATORY_CARE_PROVIDER_SITE_OTHER): Payer: Medicare Other | Admitting: Urology

## 2022-01-20 ENCOUNTER — Ambulatory Visit: Payer: Medicare Other | Admitting: Urology

## 2022-01-20 VITALS — BP 195/88 | HR 73

## 2022-01-20 DIAGNOSIS — N3001 Acute cystitis with hematuria: Secondary | ICD-10-CM | POA: Diagnosis not present

## 2022-01-20 DIAGNOSIS — C61 Malignant neoplasm of prostate: Secondary | ICD-10-CM

## 2022-01-20 DIAGNOSIS — R339 Retention of urine, unspecified: Secondary | ICD-10-CM

## 2022-01-20 DIAGNOSIS — N401 Enlarged prostate with lower urinary tract symptoms: Secondary | ICD-10-CM | POA: Diagnosis not present

## 2022-01-20 DIAGNOSIS — N138 Other obstructive and reflux uropathy: Secondary | ICD-10-CM

## 2022-01-20 LAB — URINALYSIS, ROUTINE W REFLEX MICROSCOPIC
Bilirubin, UA: NEGATIVE
Glucose, UA: NEGATIVE
Ketones, UA: NEGATIVE
Nitrite, UA: POSITIVE — AB
Specific Gravity, UA: 1.02 (ref 1.005–1.030)
Urobilinogen, Ur: 0.2 mg/dL (ref 0.2–1.0)
pH, UA: 6 (ref 5.0–7.5)

## 2022-01-20 LAB — MICROSCOPIC EXAMINATION: WBC, UA: >30 /hpf — AB (ref 0–5)

## 2022-01-20 LAB — BLADDER SCAN AMB NON-IMAGING: Scan Result: 77

## 2022-01-20 MED ORDER — TAMSULOSIN HCL 0.4 MG PO CAPS: 0.4000 mg | ORAL_CAPSULE | Freq: Every day | ORAL | 11 refills | Status: DC

## 2022-01-20 NOTE — Progress Notes (Signed)
01/20/2022 10:43 AM   Derrick Long Derrick Long Aug 23, 1926 [id number]  Referring provider: Center, [facility] [address] [place],  [place] [address]  Followup prostate cancer and BPH   HPI: Derrick Long is a 88yo here for followup for BPH with urinary retention and prostate cancer. IPSS 6 QOL 2 on flomax 0.'4mg'$ . Urine stream strong. Nocturia 1-2x. No straining to urinate.  PSA 1.2    PMH: Past Medical History:  Diagnosis Date   Acid reflux    Acute colitis 09/16/2019   AKI (acute kidney injury) ([place]) 07/31/2012   Anemia    BENIGN PROSTATIC HYPERTROPHY, WITH OBSTRUCTION 12/18/2005   Qualifier: Diagnosis of  By: Oleta Mouse     Chest pain 07/31/2012   Colitis 09/16/2019   Colon cancer ([place])    per patient, colon cancer more than 10 years ago s/p right hemicolectomy   Elevated bilirubin    Elevated LFTs    Ex-cigarette smoker    Gout    Hyperlipemia    Hypertension    Hyponatremia 08/01/2021   Intractable abdominal pain 02/03/2021   LATERAL MENISCUS TEAR, RIGHT 04/19/2006   Qualifier: Diagnosis of  By: Jonna Munro MD, Cornelius     Leukocytosis, unspecified 07/31/2012   Paroxysmal atrial fibrillation ([place]) 2014   Presence of permanent cardiac pacemaker    Prostate cancer ([place]) 08/08/2013   SEEDS 20 YEARS AGO   Prostate disease    RENAL CALCULUS 12/18/2005   Qualifier: Diagnosis of  By: Oleta Mouse     RUQ pain    UTI (urinary tract infection) 03/26/2021    Surgical History: Past Surgical History:  Procedure Laterality Date   BIOPSY  02/05/2021   Procedure: BIOPSY;  Surgeon: Harvel Quale, MD;  [facility];  Service: Gastroenterology;;   BIOPSY  08/02/2021   Procedure: BIOPSY;  Surgeon: Daneil Dolin, MD;  [facility];  Service: Endoscopy;;   COLON SURGERY     Right hemicolectomy in the setting of colon cancer, per patient   CYSTOSCOPY WITH INSERTION OF UROLIFT N/A 05/19/2021   Procedure: CYSTOSCOPY WITH INSERTION OF UROLIFT;   Surgeon: Cleon Gustin, MD;  Location: [facility];  Service: Urology;  Laterality: N/A;   ENTEROSCOPY N/A 11/21/2021   Procedure: PUSH ENTEROSCOPY;  Surgeon: Harvel Quale, MD;  [facility];  Service: Gastroenterology;  Laterality: N/A;   ESOPHAGOGASTRODUODENOSCOPY (EGD) WITH PROPOFOL N/A 02/05/2021   Procedure: ESOPHAGOGASTRODUODENOSCOPY (EGD) WITH PROPOFOL;  Surgeon: Harvel Quale, MD;  [facility];  Service: Gastroenterology;  Laterality: N/A;   ESOPHAGOGASTRODUODENOSCOPY (EGD) WITH PROPOFOL N/A 08/02/2021   Procedure: ESOPHAGOGASTRODUODENOSCOPY (EGD) WITH PROPOFOL;  Surgeon: Daneil Dolin, MD;  [facility];  Service: Endoscopy;  Laterality: N/A;   ESOPHAGOGASTRODUODENOSCOPY (EGD) WITH PROPOFOL N/A 11/21/2021   Procedure: ESOPHAGOGASTRODUODENOSCOPY (EGD) WITH PROPOFOL;  Surgeon: Harvel Quale, MD;  [facility];  Service: Gastroenterology;  Laterality: N/A;  1200  ASA 3   HOT HEMOSTASIS  08/02/2021   Procedure: HOT HEMOSTASIS (ARGON PLASMA COAGULATION/BICAP);  Surgeon: Daneil Dolin, MD;  [facility];  Service: Endoscopy;;   HOT HEMOSTASIS  11/21/2021   Procedure: HOT HEMOSTASIS (ARGON PLASMA COAGULATION/BICAP);  Surgeon: Montez Morita, Quillian Quince, MD;  [facility];  Service: Gastroenterology;;   LEFT HEART CATHETERIZATION WITH CORONARY ANGIOGRAM N/A 07/21/2012   Procedure: LEFT HEART CATHETERIZATION WITH CORONARY ANGIOGRAM;  Surgeon: Troy Sine, MD;  [facility] C[facility] LAB;  Service: Cardiovascular;  Laterality: N/A;  PACEMAKER INSERTION     PERMANENT PACEMAKER INSERTION N/A 07/22/2012   Procedure: PERMANENT PACEMAKER INSERTION;  Surgeon: Sanda Klein, MD;  Location: [facility] CATH LAB;  Service: Cardiovascular;  Laterality: N/A;   PROSTATE SURGERY      Home Medications:  Allergies as of 01/20/2022   No Known Allergies      Medication List        Accurate as of January 20, 2022  10:43 AM. If you have any questions, ask your nurse or doctor.          acetaminophen 650 MG CR tablet Commonly known as: TYLENOL Take 1,300 mg by mouth every 8 (eight) hours as needed for pain.   carvedilol 3.125 MG tablet Commonly known as: COREG Take 3.125 mg by mouth 2 (two) times daily with a meal.   cefadroxil 500 MG capsule Commonly known as: DURICEF Take 1 capsule (500 mg total) by mouth 2 (two) times daily.   clotrimazole-betamethasone cream Commonly known as: Lotrisone Apply 1 application. topically 2 (two) times daily.   diclofenac sodium 1 % Gel Commonly known as: VOLTAREN Apply 2 g topically 4 (four) times daily.   Ensure Take 237 mLs by mouth daily as needed (nutrition). Vanilla or chocolate   ferrous sulfate 324 MG [place]bec Take 324 mg by mouth daily.   furosemide 20 MG tablet Commonly known as: LASIX TAKE ONE TABLET BY MOUTH EVERY OTHER DAY FOR ACCUMULATION OF FLUID   megestrol 400 MG/10ML suspension Commonly known as: MEGACE Take 10 mLs (400 mg total) by mouth 2 (two) times daily.   Nubeqa 300 MG tablet Generic drug: darolutamide Take 1 tablet (300 mg total) by mouth 2 (two) times daily with a meal.   pantoprazole 40 MG tablet Commonly known as: PROTONIX Take 1 tablet (40 mg total) by mouth daily.   predniSONE 5 MG tablet Commonly known as: DELTASONE TAKE ONE TABLET BY MOUTH ONCE DAILY WITH BREAKFAST.   promethazine 6.25 MG/5ML syrup Commonly known as: PHENERGAN TAKE 1 TEASPOONFUL BY MOUTH EVERY 8 HOURS AS NEEDED FOR NAUSEA   rivaroxaban 20 MG Tabs tablet Commonly known as: XARELTO Take 1 tablet (20 mg total) by mouth daily with supper.   tamsulosin 0.4 MG Caps capsule Commonly known as: FLOMAX Take 1 capsule (0.4 mg total) by mouth daily after supper.   traMADol 50 MG tablet Commonly known as: ULTRAM Take 1 tablet (50 mg total) by mouth every 12 (twelve) hours as needed.        Allergies: No Known Allergies  Family  History: Family History  Problem Relation Age of Onset   Cancer Neg Hx     Social History:  reports that he quit smoking about 19 years ago. His smoking use included cigarettes. He has been exposed to tobacco smoke. He has quit using smokeless tobacco.  His smokeless tobacco use included chew. He reports that he does not drink alcohol and does not use drugs.  ROS: All other review of systems were reviewed and are negative except what is noted above in HPI  Physical Exam: Pulse 73   Constitutional:  Alert and oriented, No acute distress. HEENT: [place] AT, moist mucus membranes.  Trachea midline, no masses. Cardiovascular: No clubbing, cyanosis, or edema. Respiratory: Normal respiratory effort, no increased work of breathing. GI: Abdomen is soft, nontender, nondistended, no abdominal masses GU: No CVA tenderness.  Lymph: No cervical or inguinal lymphadenopathy. Skin: No rashes, bruises or suspicious lesions. Neurologic: Grossly intact, no focal deficits, moving all 4 extremities.  Psychiatric: Normal mood and affect.  Laboratory Data: Lab Results  Component Value Date   WBC 4.3 12/03/2021   HGB 11.9 (L) 12/03/2021   HCT 35.2 (L) 12/03/2021   MCV 102.6 (H) 12/03/2021   PLT 209 12/03/2021    Lab Results  Component Value Date   CREATININE 1.20 12/03/2021    Lab Results  Component Value Date   PSA Refused 12/22/2007   PSA 0.07 (L) 06/24/2007   PSA 0.10 05/25/2006    No results found for: "TESTOSTERONE"  Lab Results  Component Value Date   HGBA1C 5.6 07/19/2012    Urinalysis    Component Value Date/Time   COLORURINE YELLOW 10/25/2021 2010   APPEARANCEUR CLOUDY (A) 10/25/2021 2010   APPEARANCEUR Clear 07/17/2021 0939   LABSPEC 1.017 10/25/2021 2010   PHURINE 6.0 10/25/2021 2010   GLUCOSEU NEGATIVE 10/25/2021 2010   HGBUR MODERATE (A) 10/25/2021 2010   HGBUR negative 06/24/2007 0833   BILIRUBINUR NEGATIVE 10/25/2021 2010   BILIRUBINUR Negative 07/17/2021 [place] 10/25/2021 2010   PROTEINUR 100 (A) 10/25/2021 2010   UROBILINOGEN 1.0 07/29/2013 2032   NITRITE NEGATIVE 10/25/2021 2010   LEUKOCYTESUR MODERATE (A) 10/25/2021 2010    Lab Results  Component Value Date   LABMICR See below: 07/17/2021   WBCUA None seen 07/17/2021   LABEPIT 0-10 07/17/2021   MUCUS Present 07/17/2021   BACTERIA RARE (A) 10/25/2021    Pertinent Imaging:  Results for orders placed in visit on 07/29/01  DG Abd 1 View  Narrative FINDINGS CLINICAL DATA:  PROSTATE CA. SINGLE VIEW ABDOMEN A NORMAL BOWEL GAS PATTERN IS DEMONSTRATED.  MILD TO MODERATE SPUR FORMATION IS DEMONSTRATED AT MULTIPLE LEVELS OF THE LUMBAR SPINE AS WELL AS MINIMAL LEVOCONVEX LUMBAR ROTARY SCOLIOSIS.  ALSO NOTED IS BRIDGING OSSIFICATION OR CALCIFICATION BETWEEN THE LOWER LUMBAR SPINE AND LEFT ILIAC WING. A TRANSITIONAL LUMBOSACRAL VERTEBRA IS ALSO DEMONSTRATED.  MILD ATHEROMATOUS ARTERIAL CALCIFICATIONS ARE NOTED. IMPRESSION NO ACUTE ABNORMALITY AND NO EVIDENCE OF BONY METASTATIC DISEASE.  No results found for this or any previous visit.  No results found for this or any previous visit.  No results found for this or any previous visit.  Results for orders placed during the hospital encounter of 08/01/13  US Renal  Narrative CLINICAL DATA:  Acute urinary retention  EXAM: RENAL/URINARY TRACT ULTRASOUND COMPLETE  COMPARISON:  04/14/2004  FINDINGS: Right Kidney:  Length: 10.2 cm. Complex 2.0 x 1.8 x 1.6 cm echogenic lesion, right kidney lower pole. We were not able the demonstrate definite blood flow within this lesion. Hypoechoic but minimally complex 2.6 x 2.2 x 1.9 cm lesion, right kidney lower pole.  Left Kidney:  Length: 1.3 cm. Mild prominence of renal sinus adipose tissue. Tiny probable cyst, left mid-upper kidney, 4 mm. Equivocal small mid kidney nonobstructive calculus.  Bladder:  Nondistended.  Catheter in urinary bladder.  IMPRESSION: 1.  Complex 2 cm echogenic lesion, right kidney lower pole, in vicinity of prior enhancing nodule. In 2006 this lesion measured 1.4 cm. This may represent slow growth of a small renal cell carcinoma. 2. Minimally complex 2.6 cm right kidney lower pole lesion, most likely a complex cyst although technically nonspecific. 3. Equivocal small left mid kidney nonobstructive calculus. Mild renal sinus adipose tissue prominence on the left.   Electronically Signed By: Sherryl Barters M.D. On: 08/01/2013 15:54  No valid procedures specified. No results found for this or any previous visit.  Results for orders placed during the hospital encounter of  02/19/21  CT Renal Stone Study  Narrative CLINICAL DATA:  Urinary retention.  History of prostate cancer.  EXAM: CT ABDOMEN AND PELVIS WITHOUT CONTRAST  TECHNIQUE: Multidetector CT imaging of the abdomen and pelvis was performed following the standard protocol without IV contrast.  COMPARISON:  02/03/2021.  FINDINGS: Lower chest: The heart is normal in size and scattered coronary artery calcifications are noted. Pacemaker leads are present in the heart. A 4 mm nodule is present in the right lower lobe, axial image 8.  Hepatobiliary: The liver is within normal limits. Mild intrahepatic and extrahepatic biliary ductal dilatation is seen which is likely due to patient's post cholecystectomy status.  Pancreas: Unremarkable. No pancreatic ductal dilatation or surrounding inflammatory changes.  Spleen: Normal in size without focal abnormality.  Adrenals/Urinary Tract: There is redemonstration of a right adrenal mass measuring 3.2 cm, unchanged. The left adrenal gland is within normal limits. Scattered hypo and hyperdense structures are present in the kidneys bilaterally. Complex masslike lesions are noted in the kidneys bilaterally measuring 2.6 cm in the mid left kidney and 2.6 cm in the mid right kidney. No renal or ureteral calculus  or obstructive uropathy bilaterally. The bladder is unremarkable.  Stomach/Bowel: The stomach is nondistended. No bowel obstruction, free air or pneumatosis. A few scattered diverticular noted along the colon without evidence of diverticulitis. Right hemicolectomy changes are noted.  Vascular/Lymphatic: Aortic atherosclerosis with ectasia of the distal abdominal aorta. 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 The common iliac artery on the left measures 1.8 cm. No abdominal or pelvic lymphadenopathy.  Reproductive: The prostate gland is enlarged and contains multiple radiopaque brachytherapy seeds.  Other: No free fluid. A small fat containing umbilical hernia is noted. There is a small right inguinal fat containing hernia.  Musculoskeletal: Degenerative changes are present in the thoracolumbar spine and bilateral hips. A stable sclerotic lesion is noted in the L2 vertebral body on the right, possible bone island.  IMPRESSION: 1. No renal or ureteral calculus or obstructive uropathy bilaterally. 2. Multiple hyperdense and hypodense lesions in the kidneys bilaterally. Complex masses are noted in the mid kidneys bilaterally, compatible with history of multifocal renal cell carcinoma and slightly increased in size from the prior exam. 3. Enlarged prostate gland containing brachytherapy seeds. 4. Right lower lobe pulmonary nodule. No follow-up needed if patient is low-risk. Non-contrast chest CT can be considered in 12 months if patient is high-risk. This recommendation follows the consensus statement: Guidelines for Management of Incidental Pulmonary Nodules Detected on CT Images: From the Fleischner Society 2017; Radiology 2017; 284:228-243. 5. Aortic atherosclerosis. 6. Coronary artery calcifications. 7. Remaining chronic findings as described above.   Electronically Signed By: Brett Fairy M.D. On: 02/20/2021  00:28   Assessment & Plan:    1. Urinary retention -continue flomax 0.'4mg'$  daily - Urinalysis, Routine w reflex microscopic - BLADDER SCAN AMB NON-IMAGING  2. Benign prostatic hyperplasia with urinary obstruction -continue flomax 0.'4mg'$  daily - Urinalysis, Routine w reflex microscopic - BLADDER SCAN AMB NON-IMAGING  3. Prostate cancer ([place]) -followup 6 months with PSA - Urinalysis, Routine w reflex microscopic  4. Acute cystitis with hematuria - Urine Culture   No follow-ups on file.  Nicolette Bang, MD  [facility] [place]

## 2022-01-20 NOTE — Progress Notes (Signed)
post void residual=77 

## 2022-01-20 NOTE — Patient Instructions (Signed)

## 2022-01-21 ENCOUNTER — Other Ambulatory Visit (HOSPITAL_COMMUNITY): Payer: Self-pay

## 2022-01-21 LAB — IRON,TIBC AND FERRITIN PANEL
Ferritin: 312 ng/mL (ref 30–400)
Iron Saturation: 62 % — ABNORMAL HIGH (ref 15–55)
Iron: 134 ug/dL (ref 38–169)
Total Iron Binding Capacity: 217 ug/dL — ABNORMAL LOW (ref 250–450)
UIBC: 83 ug/dL — ABNORMAL LOW (ref 111–343)

## 2022-01-23 ENCOUNTER — Telehealth: Payer: Self-pay

## 2022-01-23 LAB — URINE CULTURE

## 2022-01-23 MED ORDER — CEFUROXIME AXETIL 250 MG PO TABS: 250.0000 mg | ORAL_TABLET | Freq: Two times a day (BID) | ORAL | 0 refills | Status: DC

## 2022-01-23 NOTE — Telephone Encounter (Signed)
Reviewed urine culture with Dr. Alyson Ingles. Called and discussed with daughter and voiced understanding. Prescription sent to pharmacy.

## 2022-01-23 NOTE — Telephone Encounter (Signed)
-----   Message from Dorisann Frames, RN sent at 01/23/2022 12:39 PM EST ----- Per Dr. Alyson Ingles ceftin '250mg'$  BID for 7 days

## 2022-01-27 ENCOUNTER — Encounter: Payer: Self-pay | Admitting: Urology

## 2022-02-04 ENCOUNTER — Other Ambulatory Visit (HOSPITAL_COMMUNITY): Payer: Self-pay

## 2022-02-04 ENCOUNTER — Other Ambulatory Visit: Payer: Self-pay

## 2022-02-08 ENCOUNTER — Other Ambulatory Visit: Payer: Self-pay | Admitting: Hematology

## 2022-02-10 ENCOUNTER — Encounter (HOSPITAL_COMMUNITY): Payer: Self-pay | Admitting: Hematology

## 2022-02-24 ENCOUNTER — Other Ambulatory Visit: Payer: Self-pay | Admitting: Hematology

## 2022-02-24 ENCOUNTER — Other Ambulatory Visit (HOSPITAL_COMMUNITY): Payer: Self-pay

## 2022-02-24 DIAGNOSIS — C61 Malignant neoplasm of prostate: Secondary | ICD-10-CM

## 2022-02-24 MED ORDER — NUBEQA 300 MG PO TABS
300.0000 mg | ORAL_TABLET | Freq: Two times a day (BID) | ORAL | 0 refills | Status: DC
  Filled 2022-02-24 – 2022-03-03 (×2): qty 60, 30d supply, fill #0

## 2022-02-26 ENCOUNTER — Inpatient Hospital Stay: Payer: Medicare Other | Attending: Hematology

## 2022-02-26 ENCOUNTER — Other Ambulatory Visit (HOSPITAL_COMMUNITY): Payer: Self-pay

## 2022-02-26 VITALS — BP 155/92 | HR 64 | Temp 97.8°F | Resp 18

## 2022-02-26 DIAGNOSIS — D649 Anemia, unspecified: Secondary | ICD-10-CM | POA: Insufficient documentation

## 2022-02-26 DIAGNOSIS — C7971 Secondary malignant neoplasm of right adrenal gland: Secondary | ICD-10-CM | POA: Diagnosis not present

## 2022-02-26 DIAGNOSIS — C61 Malignant neoplasm of prostate: Secondary | ICD-10-CM | POA: Insufficient documentation

## 2022-02-26 DIAGNOSIS — Z5111 Encounter for antineoplastic chemotherapy: Secondary | ICD-10-CM | POA: Diagnosis present

## 2022-02-26 MED ORDER — LEUPROLIDE ACETATE (6 MONTH) 45 MG ~~LOC~~ KIT
45.0000 mg | PACK | Freq: Once | SUBCUTANEOUS | Status: AC
  Administered 2022-02-26: 45 mg via SUBCUTANEOUS
  Filled 2022-02-26: qty 45

## 2022-02-26 MED ORDER — LANREOTIDE ACETATE 120 MG/0.5ML ~~LOC~~ SOLN
SUBCUTANEOUS | Status: AC
  Filled 2022-02-26: qty 120

## 2022-02-26 NOTE — Patient Instructions (Signed)
[facility]  Discharge Instructions: Thank you for choosing [facility] to provide your oncology and hematology care.  If you have a lab appointment with the [facility], please come in thru the Main Entrance and check in at the main information desk.  Wear comfortable clothing and clothing appropriate for easy access to any Portacath or PICC line.   We strive to give you quality time with your provider. You may need to reschedule your appointment if you arrive late (15 or more minutes).  Arriving late affects you and other patients whose appointments are after yours.  Also, if you miss three or more appointments without notifying the office, you may be dismissed from the clinic at the provider's discretion.      For prescription refill requests, have your pharmacy contact our office and allow 72 hours for refills to be completed.    Today you received the following Lupron, return as scheduled.   To help prevent nausea and vomiting after your treatment, we encourage you to take your nausea medication as directed.  BELOW ARE SYMPTOMS THAT SHOULD BE REPORTED IMMEDIATELY: *FEVER GREATER THAN 100.4 F (38 C) OR HIGHER *CHILLS OR SWEATING *NAUSEA AND VOMITING THAT IS NOT CONTROLLED WITH YOUR NAUSEA MEDICATION *UNUSUAL SHORTNESS OF BREATH *UNUSUAL BRUISING OR BLEEDING *URINARY PROBLEMS (pain or burning when urinating, or frequent urination) *BOWEL PROBLEMS (unusual diarrhea, constipation, pain near the anus) TENDERNESS IN MOUTH AND THROAT WITH OR WITHOUT PRESENCE OF ULCERS (sore throat, sores in mouth, or a toothache) UNUSUAL RASH, SWELLING OR PAIN  UNUSUAL VAGINAL DISCHARGE OR ITCHING   Items with * indicate a potential emergency and should be followed up as soon as possible or go to the Emergency Department if any problems should occur.  Please show the CHEMOTHERAPY ALERT CARD or IMMUNOTHERAPY ALERT CARD at check-in to the Emergency Department and triage  nurse.  Should you have questions after your visit or need to cancel or reschedule your appointment, please contact [facility] [phone]  and follow the prompts.  Office hours are 8:00 a.m. to 4:30 p.m. Monday - Friday. Please note that voicemails left after 4:00 p.m. may not be returned until the following business day.  We are closed weekends and major holidays. You have access to a nurse at all times for urgent questions. Please call the main number to the clinic [phone] and follow the prompts.  For any non-urgent questions, you may also contact your provider using MyChart. We now offer e-Visits for anyone 57 and older to request care online for non-urgent symptoms. For details visit [email or web address].   Also download the MyChart app! Go to the app store, search "MyChart", open the app, select [facility], and log in with your MyChart username and password.

## 2022-02-26 NOTE — Progress Notes (Signed)
Patient tolerated injection with no complaints voiced. Site clean and dry with no bruising or swelling noted at site. See MAR for details. Band aid applied.  Patient stable during and after injection. VSS with discharge and left in satisfactory condition with no s/s of distress noted.  

## 2022-03-03 ENCOUNTER — Other Ambulatory Visit (HOSPITAL_COMMUNITY): Payer: Self-pay

## 2022-03-05 ENCOUNTER — Inpatient Hospital Stay: Payer: Medicare Other

## 2022-03-05 DIAGNOSIS — C61 Malignant neoplasm of prostate: Secondary | ICD-10-CM

## 2022-03-05 DIAGNOSIS — Z5111 Encounter for antineoplastic chemotherapy: Secondary | ICD-10-CM | POA: Diagnosis not present

## 2022-03-05 LAB — CBC WITH DIFFERENTIAL/PLATELET
Abs Immature Granulocytes: 0.01 10*3/uL (ref 0.00–0.07)
Basophils Absolute: 0 10*3/uL (ref 0.0–0.1)
Basophils Relative: 0 %
Eosinophils Absolute: 0 10*3/uL (ref 0.0–0.5)
Eosinophils Relative: 0 %
HCT: 33.6 % — ABNORMAL LOW (ref 39.0–52.0)
Hemoglobin: 11.2 g/dL — ABNORMAL LOW (ref 13.0–17.0)
Immature Granulocytes: 0 %
Lymphocytes Relative: 21 %
Lymphs Abs: 1 10*3/uL (ref 0.7–4.0)
MCH: 34 pg (ref 26.0–34.0)
MCHC: 33.3 g/dL (ref 30.0–36.0)
MCV: 102.1 fL — ABNORMAL HIGH (ref 80.0–100.0)
Monocytes Absolute: 0.3 10*3/uL (ref 0.1–1.0)
Monocytes Relative: 7 %
Neutro Abs: 3.4 10*3/uL (ref 1.7–7.7)
Neutrophils Relative %: 72 %
Platelets: 239 10*3/uL (ref 150–400)
RBC: 3.29 MIL/uL — ABNORMAL LOW (ref 4.22–5.81)
RDW: 12.8 % (ref 11.5–15.5)
WBC: 4.8 10*3/uL (ref 4.0–10.5)
nRBC: 0 % (ref 0.0–0.2)

## 2022-03-05 LAB — PSA: Prostatic Specific Antigen: 0.44 ng/mL (ref 0.00–4.00)

## 2022-03-05 LAB — MAGNESIUM: Magnesium: 1.7 mg/dL (ref 1.7–2.4)

## 2022-03-05 LAB — COMPREHENSIVE METABOLIC PANEL
ALT: 5 U/L (ref 0–44)
AST: 21 U/L (ref 15–41)
Albumin: 3.7 g/dL (ref 3.5–5.0)
Alkaline Phosphatase: 62 U/L (ref 38–126)
Anion gap: 8 (ref 5–15)
BUN: 17 mg/dL (ref 8–23)
CO2: 25 mmol/L (ref 22–32)
Calcium: 8.9 mg/dL (ref 8.9–10.3)
Chloride: 100 mmol/L (ref 98–111)
Creatinine, Ser: 1.16 mg/dL (ref 0.61–1.24)
GFR, Estimated: 59 mL/min — ABNORMAL LOW (ref >60)
Glucose, Bld: 100 mg/dL — ABNORMAL HIGH (ref 70–99)
Potassium: 3.6 mmol/L (ref 3.5–5.1)
Sodium: 133 mmol/L — ABNORMAL LOW (ref 135–145)
Total Bilirubin: 0.7 mg/dL (ref 0.3–1.2)
Total Protein: 6.9 g/dL (ref 6.5–8.1)

## 2022-03-06 ENCOUNTER — Encounter: Payer: Self-pay | Admitting: Physician Assistant

## 2022-03-06 ENCOUNTER — Ambulatory Visit: Payer: Medicare Other | Admitting: Physician Assistant

## 2022-03-06 DIAGNOSIS — M25512 Pain in left shoulder: Secondary | ICD-10-CM | POA: Diagnosis not present

## 2022-03-06 DIAGNOSIS — M19012 Primary osteoarthritis, left shoulder: Secondary | ICD-10-CM

## 2022-03-06 DIAGNOSIS — G8929 Other chronic pain: Secondary | ICD-10-CM | POA: Diagnosis not present

## 2022-03-06 NOTE — Progress Notes (Signed)
Office Visit Note   Patient: Derrick Long           Date of Birth: 05-22-1926           MRN: [id number] Visit Date: 03/06/2022              Requested by: Center, [facility] [address] [place],  [place] [address] PCP: [place]  Chief Complaint  Patient presents with   Left Shoulder - Pain      HPI: Derrick Long comes in today with his daughter.  He is a pleasant 89 year old gentleman with a history of glenohumeral arthritis.  He has had ultrasound-guided injection with Lurena Joiner in the beginning in November.  He reports that this was helpful.  He has had the return of his symptoms.  Denies any injuries or falls.  He says if he turns his shoulder a certain way he gets a sharp pain.  He cannot take anti-inflammatories because of kidney disease.  He uses topical anti-inflammatory and Tylenol  Assessment & Plan: Visit Diagnoses: Arthritis left shoulder  Plan: I spoke to [facility] about Derrick Long.  He said he would be willing to give him another injection at the 69-monthmark.  They will make a 2-week follow-up with [name]  If has exacerbation of symptoms may call me anytime  Follow-Up Instructions: Return in about 2 weeks (around 03/20/2022), or 2 weeks for a glenohumeral injection with [name]   Ortho Exam  Patient is alert, oriented, no adenopathy, well-dressed, normal affect, normal respiratory effort. Examination of his left shoulder no redness no erythema he has forward elevation to about 130 degrees negative drop arm test.  He has difficulty with supination of his arm secondary to stiffness and pain in his shoulder.  External rotation is actually fairly good.  He is neurovascularly intact.  Imaging: No results found. No images are attached to the encounter.  Labs: Lab Results  Component Value Date   HGBA1C 5.6 07/19/2012   LABURIC 8.0 (H) 07/31/2012   REPTSTATUS 10/27/2021 FINAL 10/25/2021   CULT MULTIPLE SPECIES PRESENT, SUGGEST RECOLLECTION (A) 10/25/2021   LABORGA  ESCHERICHIA COLI (A) 03/26/2021     Lab Results  Component Value Date   ALBUMIN 3.7 03/05/2022   ALBUMIN 3.2 (L) 12/03/2021   ALBUMIN 3.7 10/12/2021    Lab Results  Component Value Date   MG 1.7 03/05/2022   MG 1.8 12/03/2021   MG 1.7 10/12/2021   Lab Results  Component Value Date   VD25OH See Scanned report in [place]/18/2023    No results found for: "PREALBUMIN"    Latest Ref Rng & Units 03/05/2022    1:27 PM 12/03/2021    9:20 AM 11/19/2021   11:15 AM  CBC EXTENDED  WBC 4.0 - 10.5 K/uL 4.8  4.3  3.5   RBC 4.22 - 5.81 MIL/uL 3.29  3.43  3.30   Hemoglobin 13.0 - 17.0 g/dL 11.2  11.9  11.6   HCT 39.0 - 52.0 % 33.6  35.2  34.3   Platelets 150 - 400 K/uL 239  209  252   NEUT# 1.7 - 7.7 K/uL 3.4  2.3  2.1   Lymph# 0.7 - 4.0 K/uL 1.0  1.5  0.7      There is no height or weight on file to calculate BMI.  Orders:  No orders of the defined types were placed in this encounter.  No orders of the defined types were placed in this  encounter.    Procedures: No procedures performed  Clinical Data: No additional findings.  ROS:  All other systems negative, except as noted in the HPI. Review of Systems  All other systems reviewed and are negative.   Objective: Vital Signs: There were no vitals taken for this visit.  Specialty Comments:  No specialty comments available.  PMFS History: Patient Active Problem List   Diagnosis Date Noted   Angiectasia 01/19/2022   Angiodysplasia of stomach 01/19/2022   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   Melena 10/16/2021   GI bleed 08/01/2021   Macrocytic anemia 08/01/2021   Mixed hyperlipidemia 08/01/2021   DNR (do not resuscitate) 03/26/2021   CKD (chronic kidney disease), stage III ([place]) 03/26/2021   Prostate cancer ([place]) 07/10/2020   Bilateral kidney masses 06/06/2020   Adrenal mass, right ([facility]) 06/06/2020   Pelvic lymphadenopathy 09/16/2019   Paroxysmal atrial fibrillation ([place]) 09/02/2012   HTN  (hypertension) 09/02/2012   Pacemaker 08/03/2012   Gout 07/31/2012   First degree AV block, PR interval 370 ms 07/19/2012   Chest pain, atypical 07/19/2012   Syncope 07/19/2012   Bradycardia 07/19/2012   Iron deficiency anemia due to chronic blood loss 08/16/2008   OSTEOARTHRITIS, MODERATE 09/21/2007   INSOMNIA 06/24/2007   DENTAL CARIES 10/08/2006   Migraine headache 12/18/2005   Essential hypertension 12/18/2005   GERD 12/18/2005   LOW BACK PAIN 12/18/2005   OSTEOPENIA 12/18/2005   COLON CANCER, HX OF 12/18/2005   Past Medical History:  Diagnosis Date   Acid reflux    Acute colitis 09/16/2019   AKI (acute kidney injury) ([place]) 07/31/2012   Anemia    BENIGN PROSTATIC HYPERTROPHY, WITH OBSTRUCTION 12/18/2005   Qualifier: Diagnosis of  By: Oleta Mouse     Chest pain 07/31/2012   Colitis 09/16/2019   Colon cancer ([place])    per patient, colon cancer more than 10 years ago s/p right hemicolectomy   Elevated bilirubin    Elevated LFTs    Ex-cigarette smoker    Gout    Hyperlipemia    Hypertension    Hyponatremia 08/01/2021   Intractable abdominal pain 02/03/2021   LATERAL MENISCUS TEAR, RIGHT 04/19/2006   Qualifier: Diagnosis of  By: Jonna Munro MD, Cornelius     Leukocytosis, unspecified 07/31/2012   Paroxysmal atrial fibrillation ([place]) 2014   Presence of permanent cardiac pacemaker    Prostate cancer ([place]) 08/08/2013   SEEDS 20 YEARS AGO   Prostate disease    RENAL CALCULUS 12/18/2005   Qualifier: Diagnosis of  By: Oleta Mouse     RUQ pain    UTI (urinary tract infection) 03/26/2021    Family History  Problem Relation Age of Onset   Cancer Neg Hx     Past Surgical History:  Procedure Laterality Date   BIOPSY  02/05/2021   Procedure: BIOPSY;  Surgeon: Harvel Quale, MD;  [facility];  Service: Gastroenterology;;   BIOPSY  08/02/2021   Procedure: BIOPSY;  Surgeon: Daneil Dolin, MD;  [facility];  Service: Endoscopy;;    COLON SURGERY     Right hemicolectomy in the setting of colon cancer, per patient   CYSTOSCOPY WITH INSERTION OF UROLIFT N/A 05/19/2021   Procedure: CYSTOSCOPY WITH INSERTION OF UROLIFT;  Surgeon: Cleon Gustin, MD;  Location: [facility] OR[facility];  Service: Urology;  Laterality: N/A;   ENTEROSCOPY N/A 11/21/2021   Procedure: PUSH ENTEROSCOPY;  Surgeon: Harvel Quale, MD;  [facility];  Service: Gastroenterology;  Laterality:  N/A;   ESOPHAGOGASTRODUODENOSCOPY (EGD) WITH PROPOFOL N/A 02/05/2021   Procedure: ESOPHAGOGASTRODUODENOSCOPY (EGD) WITH PROPOFOL;  Surgeon: Harvel Quale, MD;  [facility];  Service: Gastroenterology;  Laterality: N/A;   ESOPHAGOGASTRODUODENOSCOPY (EGD) WITH PROPOFOL N/A 08/02/2021   Procedure: ESOPHAGOGASTRODUODENOSCOPY (EGD) WITH PROPOFOL;  Surgeon: Daneil Dolin, MD;  [facility];  Service: Endoscopy;  Laterality: N/A;   ESOPHAGOGASTRODUODENOSCOPY (EGD) WITH PROPOFOL N/A 11/21/2021   Procedure: ESOPHAGOGASTRODUODENOSCOPY (EGD) WITH PROPOFOL;  Surgeon: Harvel Quale, MD;  [facility];  Service: Gastroenterology;  Laterality: N/A;  1200  ASA 3   HOT HEMOSTASIS  08/02/2021   Procedure: HOT HEMOSTASIS (ARGON PLASMA COAGULATION/BICAP);  Surgeon: Daneil Dolin, MD;  [facility];  Service: Endoscopy;;   HOT HEMOSTASIS  11/21/2021   Procedure: HOT HEMOSTASIS (ARGON PLASMA COAGULATION/BICAP);  Surgeon: Montez Morita, Quillian Quince, MD;  [facility];  Service: Gastroenterology;;   LEFT HEART CATHETERIZATION WITH CORONARY ANGIOGRAM N/A 07/21/2012   Procedure: LEFT HEART CATHETERIZATION WITH CORONARY ANGIOGRAM;  Surgeon: Troy Sine, MD;  Location: [facility] CATH LAB;  Service: Cardiovascular;  Laterality: N/A;   PACEMAKER INSERTION     PERMANENT PACEMAKER INSERTION N/A 07/22/2012   Procedure: PERMANENT PACEMAKER INSERTION;  Surgeon: Sanda Klein, MD;  Location: [facility] CATH LAB;  Service:  Cardiovascular;  Laterality: N/A;   PROSTATE SURGERY     Social History   Occupational History   Not on file  Tobacco Use   Smoking status: Former    Types: Cigarettes    Quit date: 07/20/2002    Years since quitting: 19.6    Passive exposure: Past   Smokeless tobacco: Former    Types: Nurse, children's Use: Never used  Substance and Sexual Activity   Alcohol use: No   Drug use: No   Sexual activity: Not on file

## 2022-03-12 ENCOUNTER — Inpatient Hospital Stay (HOSPITAL_BASED_OUTPATIENT_CLINIC_OR_DEPARTMENT_OTHER): Payer: Medicare Other | Admitting: Hematology

## 2022-03-12 VITALS — BP 194/86 | HR 65 | Temp 98.4°F | Resp 16 | Wt 184.0 lb

## 2022-03-12 DIAGNOSIS — C61 Malignant neoplasm of prostate: Secondary | ICD-10-CM | POA: Diagnosis not present

## 2022-03-12 DIAGNOSIS — Z5111 Encounter for antineoplastic chemotherapy: Secondary | ICD-10-CM | POA: Diagnosis not present

## 2022-03-12 NOTE — Progress Notes (Signed)
[place], [place] [address]   CLINIC:  Medical Oncology/Hematology  PCP:  Center, [place] / [place] [address] [phone]   REASON FOR VISIT:  Follow-up for metastatic prostate cancer to the right adrenal gland  PRIOR THERAPY: none  NGS Results: not done  CURRENT THERAPY: Lupron every 6 months, darolutamide.  BRIEF ONCOLOGIC HISTORY:  Oncology History   No history exists.    CANCER STAGING:  Cancer Staging  Prostate cancer [facility]) Staging form: Prostate, AJCC 8th Edition - Clinical stage from 07/10/2020: Stage IVB (cTX, cNX, pM1c, PSA: 10.2) - Unsigned   INTERVAL HISTORY:  [name], a 89 y.o. male, seen for follow-up of metastatic prostate cancer.  He is tolerating darolutamide 300 mg twice daily very well.  He is accompanied by his daughter today.  Energy levels are reported as 70%.  He is eating well and is being physically active.  Denies any pains.  REVIEW OF SYSTEMS:  Review of Systems  Constitutional:  Negative for appetite change and fatigue.  Musculoskeletal:  Negative for back pain.  Psychiatric/Behavioral:  Negative for depression and sleep disturbance.   All other systems reviewed and are negative.   PAST MEDICAL/SURGICAL HISTORY:  Past Medical History:  Diagnosis Date   Acid reflux    Acute colitis 09/16/2019   AKI (acute kidney injury) ([place]) 07/31/2012   Anemia    BENIGN PROSTATIC HYPERTROPHY, WITH OBSTRUCTION 12/18/2005   Qualifier: Diagnosis of  By: Oleta Mouse     Chest pain 07/31/2012   Colitis 09/16/2019   Colon cancer ([place])    per patient, colon cancer more than 10 years ago s/p right hemicolectomy   Elevated bilirubin    Elevated LFTs    Ex-cigarette smoker    Gout    Hyperlipemia    Hypertension    Hyponatremia 08/01/2021   Intractable abdominal pain 02/03/2021   LATERAL MENISCUS TEAR, RIGHT 04/19/2006   Qualifier: Diagnosis of  By: Jonna Munro MD, Cornelius      Leukocytosis, unspecified 07/31/2012   Paroxysmal atrial fibrillation ([place]) 2014   Presence of permanent cardiac pacemaker    Prostate cancer ([place]) 08/08/2013   SEEDS 20 YEARS AGO   Prostate disease    RENAL CALCULUS 12/18/2005   Qualifier: Diagnosis of  By: Oleta Mouse     RUQ pain    UTI (urinary tract infection) 03/26/2021   Past Surgical History:  Procedure Laterality Date   BIOPSY  02/05/2021   Procedure: BIOPSY;  Surgeon: Harvel Quale, MD;  [facility];  Service: Gastroenterology;;   BIOPSY  08/02/2021   Procedure: BIOPSY;  Surgeon: Daneil Dolin, MD;  [facility];  Service: Endoscopy;;   COLON SURGERY     Right hemicolectomy in the setting of colon cancer, per patient   CYSTOSCOPY WITH INSERTION OF UROLIFT N/A 05/19/2021   Procedure: CYSTOSCOPY WITH INSERTION OF UROLIFT;  Surgeon: Cleon Gustin, MD;  Location: [facility];  Service: Urology;  Laterality: N/A;   ENTEROSCOPY N/A 11/21/2021   Procedure: PUSH ENTEROSCOPY;  Surgeon: Harvel Quale, MD;  [facility];  Service: Gastroenterology;  Laterality: N/A;   ESOPHAGOGASTRODUODENOSCOPY (EGD) WITH PROPOFOL N/A 02/05/2021   Procedure: ESOPHAGOGASTRODUODENOSCOPY (EGD) WITH PROPOFOL;  Surgeon: Harvel Quale, MD;  [facility];  Service: Gastroenterology;  Laterality: N/A;   ESOPHAGOGASTRODUODENOSCOPY (EGD) WITH PROPOFOL N/A 08/02/2021   Procedure: ESOPHAGOGASTRODUODENOSCOPY (EGD) WITH PROPOFOL;  Surgeon: Manus Rudd  M, MD;  [facility];  Service: Endoscopy;  Laterality: N/A;   ESOPHAGOGASTRODUODENOSCOPY (EGD) WITH PROPOFOL N/A 11/21/2021   Procedure: ESOPHAGOGASTRODUODENOSCOPY (EGD) WITH PROPOFOL;  Surgeon: Harvel Quale, MD;  [facility];  Service: Gastroenterology;  Laterality: N/A;  1200  ASA 3   HOT HEMOSTASIS  08/02/2021   Procedure: HOT HEMOSTASIS (ARGON PLASMA COAGULATION/BICAP);  Surgeon: Daneil Dolin, MD;  [facility];  Service: Endoscopy;;   HOT HEMOSTASIS  11/21/2021   Procedure: HOT HEMOSTASIS (ARGON PLASMA COAGULATION/BICAP);  Surgeon: Montez Morita, Quillian Quince, MD;  [facility];  Service: Gastroenterology;;   LEFT HEART CATHETERIZATION WITH CORONARY ANGIOGRAM N/A 07/21/2012   Procedure: LEFT HEART CATHETERIZATION WITH CORONARY ANGIOGRAM;  Surgeon: Troy Sine, MD;  Location: [facility] CATH LAB;  Service: Cardiovascular;  Laterality: N/A;   PACEMAKER INSERTION     PERMANENT PACEMAKER INSERTION N/A 07/22/2012   Procedure: PERMANENT PACEMAKER INSERTION;  Surgeon: Sanda Klein, MD;  Location: [facility] CATH LAB;  Service: Cardiovascular;  Laterality: N/A;   PROSTATE SURGERY      SOCIAL HISTORY:  Social History   Socioeconomic History   Marital status: Married    Spouse name: Not on file   Number of children: Not on file   Years of education: Not on file   Highest education level: Not on file  Occupational History   Not on file  Tobacco Use   Smoking status: Former    Types: Cigarettes    Quit date: 07/20/2002    Years since quitting: 19.6    Passive exposure: Past   Smokeless tobacco: Former    Types: Nurse, children's Use: Never used  Substance and Sexual Activity   Alcohol use: No   Drug use: No   Sexual activity: Not on file  Other Topics Concern   Not on file  Social History Narrative   Not on file   Social Determinants of Health   Financial Resource Strain: Low Risk  (05/16/2020)   Overall Financial Resource Strain (CARDIA)    Difficulty of Paying Living Expenses: Not hard at all  Food Insecurity: No Food Insecurity (05/16/2020)   Hunger Vital Sign    Worried About Running Out of Food in the Last Year: Never true    [place] in the Last Year: Never true  Transportation Needs: No Transportation Needs (05/16/2020)   PRAPARE - Hydrologist (Medical): No    Lack of Transportation (Non-Medical): No   Physical Activity: Insufficiently Active (05/16/2020)   Exercise Vital Sign    Days of Exercise per Week: 3 days    Minutes of Exercise per Session: 30 min  Stress: No Stress Concern Present (05/16/2020)   [place]    Feeling of Stress : Not at all  Social Connections: Moderately Integrated (05/16/2020)   Social Connection and Isolation Panel [NHANES]    Frequency of Communication with Friends and Family: More than three times a week    Frequency of Social Gatherings with Friends and Family: More than three times a week    Attends Religious Services: More than 4 times per year    Active Member of Clubs or Organizations: No    Attends Archivist Meetings: 1 to 4 times per year    Marital Status: Widowed  Intimate Partner Violence: Not At Risk (05/16/2020)   Humiliation, Afraid, Rape, and Kick questionnaire  Fear of Current or Ex-Partner: No    Emotionally Abused: No    Physically Abused: No    Sexually Abused: No    FAMILY HISTORY:  Family History  Problem Relation Age of Onset   Cancer Neg Hx     CURRENT MEDICATIONS:  Current Outpatient Medications  Medication Sig Dispense Refill   acetaminophen (TYLENOL) 650 MG CR tablet Take 1,300 mg by mouth every 8 (eight) hours as needed for pain.     amLODipine (NORVASC) 10 MG tablet Take 1 tablet by mouth daily.     carvedilol (COREG) 3.125 MG tablet Take 3.125 mg by mouth 2 (two) times daily with a meal.     cefadroxil (DURICEF) 500 MG capsule Take 1 capsule (500 mg total) by mouth 2 (two) times daily. 14 capsule 0   cefUROXime (CEFTIN) 250 MG tablet Take 1 tablet (250 mg total) by mouth 2 (two) times daily with a meal. 14 tablet 0   clotrimazole-betamethasone (LOTRISONE) cream Apply 1 application. topically 2 (two) times daily. 30 g 0   darolutamide (NUBEQA) 300 MG tablet Take 1 tablet (300 mg total) by mouth 2 (two) times daily with a meal. 60 tablet 0    diclofenac sodium (VOLTAREN) 1 % GEL Apply 2 g topically 4 (four) times daily. 100 g 0   Ensure (ENSURE) Take 237 mLs by mouth daily as needed (nutrition). Vanilla or chocolate     ferrous sulfate 324 MG TBEC Take 324 mg by mouth daily.     furosemide (LASIX) 20 MG tablet TAKE ONE TABLET BY MOUTH EVERY OTHER DAY FOR ACCUMULATION OF FLUID     lisinopril (ZESTRIL) 10 MG tablet Take 1 tablet by mouth daily.     megestrol (MEGACE) 400 MG/10ML suspension Take 10 mLs (400 mg total) by mouth 2 (two) times daily. 480 mL 2   pantoprazole (PROTONIX) 40 MG tablet Take 1 tablet (40 mg total) by mouth daily. 90 tablet 3   predniSONE (DELTASONE) 5 MG tablet TAKE ONE TABLET BY MOUTH ONCE DAILY WITH BREAKFAST. 30 tablet 0   rivaroxaban (XARELTO) 20 MG TABS tablet Take 1 tablet (20 mg total) by mouth daily with supper. 20 tablet 0   tamsulosin (FLOMAX) 0.4 MG CAPS capsule Take 1 capsule (0.4 mg total) by mouth daily after supper. 30 capsule 11   traMADol (ULTRAM) 50 MG tablet Take 1 tablet (50 mg total) by mouth every 12 (twelve) hours as needed. 30 tablet 0   promethazine (PHENERGAN) 6.25 MG/5ML syrup TAKE 1 TEASPOONFUL BY MOUTH EVERY 8 HOURS AS NEEDED FOR NAUSEA (Patient not taking: Reported on 03/12/2022)     No current facility-administered medications for this visit.   Facility-Administered Medications Ordered in Other Visits  Medication Dose Route Frequency Provider Last Rate Last Admin   octreotide (SANDOSTATIN LAR) 20 MG IM injection             ALLERGIES:  No Known Allergies  PHYSICAL EXAM:  Performance status (ECOG): 1 - Symptomatic but completely ambulatory  Vitals:   03/12/22 1407  BP: (!) 194/86  Pulse: 65  Resp: 16  Temp: 98.4 F (36.9 C)  SpO2: 96%   Wt Readings from Last 3 Encounters:  03/12/22 184 lb (83.5 kg)  01/19/22 181 lb 6.4 oz (82.3 kg)  12/11/21 177 lb 14.6 oz (80.7 kg)   Physical Exam Vitals reviewed.  Constitutional:      Appearance: Normal appearance.      Comments: In wheelchair  Cardiovascular:  Rate and Rhythm: Normal rate and regular rhythm.     Pulses: Normal pulses.     Heart sounds: Normal heart sounds.  Pulmonary:     Effort: Pulmonary effort is normal.     Breath sounds: Normal breath sounds.  Musculoskeletal:     Right lower leg: No edema.     Left lower leg: No edema.  Neurological:     General: No focal deficit present.     Mental Status: He is alert and oriented to person, place, and time.  Psychiatric:        Mood and Affect: Mood normal.        Behavior: Behavior normal.      LABORATORY DATA:  I have reviewed the labs as listed.     Latest Ref Rng & Units 03/05/2022    1:27 PM 12/03/2021    9:20 AM 11/19/2021   11:15 AM  CBC  WBC 4.0 - 10.5 K/uL 4.8  4.3  3.5   Hemoglobin 13.0 - 17.0 g/dL 11.2  11.9  11.6   Hematocrit 39.0 - 52.0 % 33.6  35.2  34.3   Platelets 150 - 400 K/uL 239  209  252       Latest Ref Rng & Units 03/05/2022    1:27 PM 12/03/2021    9:20 AM 10/29/2021   10:07 AM  CMP  Glucose 70 - 99 mg/dL 100  107  94   BUN 8 - 23 mg/dL '17  20  15   '$ Creatinine 0.61 - 1.24 mg/dL 1.16  1.20  1.04   Sodium 135 - 145 mmol/L 133  134  135   Potassium 3.5 - 5.1 mmol/L 3.6  3.8  3.9   Chloride 98 - 111 mmol/L 100  109  112   CO2 22 - 32 mmol/L '25  20  21   '$ Calcium 8.9 - 10.3 mg/dL 8.9  8.8  8.6   Total Protein 6.5 - 8.1 g/dL 6.9  6.7    Total Bilirubin 0.3 - 1.2 mg/dL 0.7  1.0    Alkaline Phos 38 - 126 U/L 62  56    AST 15 - 41 U/L 21  22    ALT 0 - 44 U/L 5  18      DIAGNOSTIC IMAGING:  I have independently reviewed the scans and discussed with the patient. No results found.   ASSESSMENT:  1.  Metastatic prostate cancer to the right adrenal gland: -Presentation to the ER with worsening right-sided back pain, which has been on and off for the last couple of years. - Some decrease in appetite with 6 pound weight loss in the last 6 months.  No hematuria or other B symptoms. - CT renal study on  05/03/2020 showed new right adrenal mass with bilateral renal lesions.  Indeterminate L2 vertebral bodies sclerotic lesion.  Low-attenuation lesions in the kidneys measure up to 2.3 cm on the right and were similar to scan from July 2021. - CT CAP on 06/12/2020 did not show any evidence of nodal metastasis or metastatic disease in the chest.  Right adrenal mass measures 4.2 x 3.8 cm exerts mass-effect upon the posterior and right lateral wall of the IVC.  Right kidney masses measuring 2.3 x 2.3 cm and 1.9 x 1.7 cm suspicious for RCC.  Upper pole of the left kidney lesion 2.2 x 2.0 cm and medial cortex of the inferior pole measuring 2 x 2 cm and another interpolar left kidney lesion measuring 1.3  x 1.5 cm. - Bone scan on 06/13/2020 was negative. - Right adrenal biopsy consistent with prostatic adenocarcinoma - Degarelix on 07/16/2020, transition to Eligard 45 mg on 08/21/2020. - Abiraterone 100 mg daily/prednisone 5 mg daily started around 01/22/2021.,  Discontinued in April 2023.  Not a candidate for Erleada due to blood thinner. - Darolutamide 300 mg twice daily started on 06/19/2021, increased to 600 mg twice daily on 06/26/2021, cut back to 300 mg twice daily on 07/07/2021 due to decreased oral intake and tiredness.   2.  Social/family history: - He lives at home with his daughter.  He walks with a cane. - He mows lawn with a riding mower.  He has a garden and gross vegetables.  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  Quit smoking 25 years ago. - No family history of malignancies that he is aware of.   PLAN:  1.  Metastatic prostate cancer to the right adrenal gland: - He is taking darolutamide twice daily and tolerating well. - Denies any falls in the last 3 months. - Last Eligard was on 02/26/2022.  Blood pressure is slightly high in our office.  He reports that blood pressure at home is in the 150 range. - Reviewed labs today which showed normal LFTs.  CBC was grossly normal with mild normocytic  anemia.  PSA improved to 0.44 from 1.31 on 12/03/2021. - Continue darolutamide 300 mg twice daily.  RTC 3 months with repeat labs.  2.  Right sided back pain: - This has completely resolved.  He is currently taking tramadol as needed for left shoulder pain.     Orders placed this encounter:  No orders of the defined types were placed in this encounter.     Derek Jack, MD [place] [phone]

## 2022-03-12 NOTE — Patient Instructions (Signed)
[facility]  Discharge Instructions  You were seen and examined today by Dr. Delton Coombes.  Dr. Delton Coombes discussed your most recent lab work which revealed everything looks good and stable.  Follow-up as scheduled in 3 months.    Thank you for choosing [facility] to provide your oncology and hematology care.   To afford each patient quality time with our provider, please arrive at least 15 minutes before your scheduled appointment time. You may need to reschedule your appointment if you arrive late (10 or more minutes). Arriving late affects you and other patients whose appointments are after yours.  Also, if you miss three or more appointments without notifying the office, you may be dismissed from the clinic at the provider's discretion.    Again, thank you for choosing [facility].  Our hope is that these requests will decrease the amount of time that you wait before being seen by our physicians.   If you have a lab appointment with the [facility] please come in thru the Main Entrance and check in at the main information desk.           _____________________________________________________________  Should you have questions after your visit to [facility], please contact our office at [phone] and follow the prompts.  Our office hours are 8:00 a.m. to 4:30 p.m. Monday - Thursday and 8:00 a.m. to 2:30 p.m. Friday.  Please note that voicemails left after 4:00 p.m. may not be returned until the following business day.  We are closed weekends and all major holidays.  You do have access to a nurse 24-7, just call the main number to the clinic [phone] and do not press any options, hold on the line and a nurse will answer the phone.    For prescription refill requests, have your pharmacy contact our office and allow 72 hours.    Masks are optional in the cancer centers. If you would like for your  care team to wear a mask while they are taking care of you, please let them know. You may have one support person who is at least 86 years old accompany you for your appointments.

## 2022-03-13 ENCOUNTER — Other Ambulatory Visit: Payer: Self-pay | Admitting: Hematology

## 2022-03-23 ENCOUNTER — Other Ambulatory Visit: Payer: Self-pay

## 2022-03-23 ENCOUNTER — Ambulatory Visit: Payer: Self-pay

## 2022-03-23 ENCOUNTER — Ambulatory Visit: Payer: Medicare Other | Admitting: Surgical

## 2022-03-23 DIAGNOSIS — M19012 Primary osteoarthritis, left shoulder: Secondary | ICD-10-CM | POA: Diagnosis not present

## 2022-03-24 ENCOUNTER — Telehealth: Payer: Self-pay | Admitting: Surgical

## 2022-03-24 NOTE — Telephone Encounter (Signed)
Patient states that Lurena Joiner was suppose to send in a script for tramadol for him on yesterday to pharmacy

## 2022-03-25 ENCOUNTER — Other Ambulatory Visit: Payer: Self-pay | Admitting: Hematology

## 2022-03-25 ENCOUNTER — Other Ambulatory Visit (HOSPITAL_COMMUNITY): Payer: Self-pay

## 2022-03-25 ENCOUNTER — Other Ambulatory Visit: Payer: Self-pay | Admitting: *Deleted

## 2022-03-25 ENCOUNTER — Other Ambulatory Visit: Payer: Self-pay

## 2022-03-25 DIAGNOSIS — C61 Malignant neoplasm of prostate: Secondary | ICD-10-CM

## 2022-03-25 MED ORDER — TRAMADOL HCL 50 MG PO TABS: 50.0000 mg | ORAL_TABLET | Freq: Every evening | ORAL | 0 refills | Status: DC | PRN

## 2022-03-25 MED ORDER — NUBEQA 300 MG PO TABS
300.0000 mg | ORAL_TABLET | Freq: Two times a day (BID) | ORAL | 0 refills | Status: DC
  Filled 2022-03-25: qty 60, 30d supply, fill #0

## 2022-03-25 NOTE — Telephone Encounter (Signed)
I sent that in this morning sorry for delay

## 2022-03-25 NOTE — Telephone Encounter (Signed)
Called and advised.

## 2022-03-25 NOTE — Telephone Encounter (Signed)
Refill approved for Nubeqa.  Patient is tolerating and is to continue therapy.

## 2022-03-29 ENCOUNTER — Encounter: Payer: Self-pay | Admitting: Surgical

## 2022-03-29 MED ORDER — LIDOCAINE HCL 1 % IJ SOLN
5.0000 mL | INTRAMUSCULAR | Status: AC | PRN
  Administered 2022-03-23: 5 mL

## 2022-03-29 MED ORDER — BUPIVACAINE HCL 0.5 % IJ SOLN
9.0000 mL | INTRAMUSCULAR | Status: AC | PRN
  Administered 2022-03-23: 9 mL via INTRA_ARTICULAR

## 2022-03-29 MED ORDER — METHYLPREDNISOLONE ACETATE 40 MG/ML IJ SUSP
40.0000 mg | INTRAMUSCULAR | Status: AC | PRN
  Administered 2022-03-23: 40 mg via INTRA_ARTICULAR

## 2022-03-29 NOTE — Progress Notes (Signed)
   Procedure Note  Patient: Derrick Long             Date of Birth: 1926-09-23           MRN: [id number]             Visit Date: 03/23/2022  Procedures: Visit Diagnoses:  1. Primary osteoarthritis, left shoulder     Large Joint Inj: L glenohumeral on 03/23/2022 10:14 AM Indications: diagnostic evaluation and pain Details: 22 G 3.5 in needle, ultrasound-guided posterior approach  Arthrogram: No  Medications: 9 mL bupivacaine 0.5 %; 40 mg methylPREDNISolone acetate 40 MG/ML; 5 mL lidocaine 1 % Outcome: tolerated well, no immediate complications  Patient returns to the office for planned left glenohumeral injection.  Recently seen by Bevely Palmer persons PA-C.  He states that this injection lasted for about 2 or 2-1/2 months.  He tolerated injection well today without any complications.  Follow-up with the office as needed Procedure, treatment alternatives, risks and benefits explained, specific risks discussed. Consent was given by the patient. Immediately prior to procedure a time out was called to verify the correct patient, procedure, equipment, support staff and site/side marked as required. Patient was prepped and draped in the usual sterile fashion.

## 2022-03-30 ENCOUNTER — Other Ambulatory Visit: Payer: Self-pay

## 2022-04-02 ENCOUNTER — Telehealth: Payer: Self-pay | Admitting: Surgical

## 2022-04-02 NOTE — Telephone Encounter (Signed)
Patient requesting a gel shot in right shoulder. Please advise

## 2022-04-02 NOTE — Telephone Encounter (Signed)
We no longer give gel injections in the shoulder.  Please advise patient.  Thank you.

## 2022-04-02 NOTE — Telephone Encounter (Signed)
IC advised gel not done in shoulder.

## 2022-04-09 ENCOUNTER — Other Ambulatory Visit: Payer: Self-pay | Admitting: Hematology

## 2022-04-15 ENCOUNTER — Ambulatory Visit: Payer: Medicare Other | Admitting: Surgical

## 2022-04-15 ENCOUNTER — Ambulatory Visit: Payer: Self-pay

## 2022-04-15 DIAGNOSIS — M19011 Primary osteoarthritis, right shoulder: Secondary | ICD-10-CM

## 2022-04-15 DIAGNOSIS — M19012 Primary osteoarthritis, left shoulder: Secondary | ICD-10-CM

## 2022-04-16 ENCOUNTER — Encounter: Payer: Self-pay | Admitting: Surgical

## 2022-04-16 MED ORDER — BUPIVACAINE HCL 0.5 % IJ SOLN
9.0000 mL | INTRAMUSCULAR | Status: AC | PRN
  Administered 2022-04-15: 9 mL via INTRA_ARTICULAR

## 2022-04-16 MED ORDER — LIDOCAINE HCL 1 % IJ SOLN
5.0000 mL | INTRAMUSCULAR | Status: AC | PRN
  Administered 2022-04-15: 5 mL

## 2022-04-16 MED ORDER — METHYLPREDNISOLONE ACETATE 40 MG/ML IJ SUSP
40.0000 mg | INTRAMUSCULAR | Status: AC | PRN
  Administered 2022-04-15: 40 mg via INTRA_ARTICULAR

## 2022-04-16 NOTE — Progress Notes (Signed)
   Procedure Note  Patient: Derrick Long             Date of Birth: 03-22-1926           MRN: [id number]             Visit Date: 04/15/2022  Procedures: Visit Diagnoses:  1. Primary osteoarthritis, right shoulder     Large Joint Inj: R glenohumeral on 04/15/2022 6:40 PM Indications: diagnostic evaluation and pain Details: 22 G 1.5 in and 3.5 in needle, ultrasound-guided posterior approach  Arthrogram: No  Medications: 9 mL bupivacaine 0.5 %; 40 mg methylPREDNISolone acetate 40 MG/ML; 5 mL lidocaine 1 % Outcome: tolerated well, no immediate complications Procedure, treatment alternatives, risks and benefits explained, specific risks discussed. Consent was given by the patient. Immediately prior to procedure a time out was called to verify the correct patient, procedure, equipment, support staff and site/side marked as required. Patient was prepped and draped in the usual sterile fashion.

## 2022-04-17 ENCOUNTER — Other Ambulatory Visit (INDEPENDENT_AMBULATORY_CARE_PROVIDER_SITE_OTHER): Payer: Self-pay | Admitting: *Deleted

## 2022-04-17 ENCOUNTER — Telehealth (INDEPENDENT_AMBULATORY_CARE_PROVIDER_SITE_OTHER): Payer: Self-pay | Admitting: *Deleted

## 2022-04-17 DIAGNOSIS — D500 Iron deficiency anemia secondary to blood loss (chronic): Secondary | ICD-10-CM

## 2022-04-17 NOTE — Telephone Encounter (Signed)
Patient in reminder file for Iron is actually a bit high, lets try doing iron pill every other day and recheck again in 3 months. ( AROUND 04/23/22)   Orders for labs put in and pt is aware.

## 2022-04-22 ENCOUNTER — Other Ambulatory Visit (HOSPITAL_COMMUNITY): Payer: Self-pay

## 2022-04-24 ENCOUNTER — Other Ambulatory Visit: Payer: Self-pay | Admitting: Hematology

## 2022-04-24 ENCOUNTER — Other Ambulatory Visit (HOSPITAL_COMMUNITY): Payer: Self-pay

## 2022-04-24 DIAGNOSIS — C61 Malignant neoplasm of prostate: Secondary | ICD-10-CM

## 2022-04-26 MED ORDER — NUBEQA 300 MG PO TABS
300.0000 mg | ORAL_TABLET | Freq: Two times a day (BID) | ORAL | 0 refills | Status: DC
  Filled 2022-04-26: qty 60, 30d supply, fill #0

## 2022-04-27 ENCOUNTER — Other Ambulatory Visit (HOSPITAL_COMMUNITY): Payer: Self-pay

## 2022-04-27 ENCOUNTER — Other Ambulatory Visit: Payer: Self-pay

## 2022-04-28 ENCOUNTER — Other Ambulatory Visit (HOSPITAL_COMMUNITY): Payer: Self-pay

## 2022-04-29 ENCOUNTER — Other Ambulatory Visit (HOSPITAL_COMMUNITY): Payer: Self-pay

## 2022-04-29 LAB — IRON,TIBC AND FERRITIN PANEL
Ferritin: 318 ng/mL (ref 30–400)
Iron Saturation: 50 % (ref 15–55)
Iron: 114 ug/dL (ref 38–169)
Total Iron Binding Capacity: 229 ug/dL — ABNORMAL LOW (ref 250–450)
UIBC: 115 ug/dL (ref 111–343)

## 2022-05-11 ENCOUNTER — Other Ambulatory Visit: Payer: Self-pay | Admitting: Hematology

## 2022-05-27 ENCOUNTER — Other Ambulatory Visit: Payer: Self-pay | Admitting: *Deleted

## 2022-05-27 ENCOUNTER — Other Ambulatory Visit: Payer: Self-pay | Admitting: Hematology

## 2022-05-27 ENCOUNTER — Other Ambulatory Visit: Payer: Self-pay

## 2022-05-27 ENCOUNTER — Other Ambulatory Visit (HOSPITAL_COMMUNITY): Payer: Self-pay

## 2022-05-27 DIAGNOSIS — C61 Malignant neoplasm of prostate: Secondary | ICD-10-CM

## 2022-05-27 MED ORDER — NUBEQA 300 MG PO TABS
300.0000 mg | ORAL_TABLET | Freq: Two times a day (BID) | ORAL | 0 refills | Status: DC
  Filled 2022-05-27: qty 60, 30d supply, fill #0

## 2022-05-27 NOTE — Telephone Encounter (Signed)
Refill for Nubeqa proved.  Patient is tolerating and is to continue therapy.

## 2022-06-01 ENCOUNTER — Other Ambulatory Visit: Payer: Self-pay

## 2022-06-09 ENCOUNTER — Other Ambulatory Visit: Payer: Self-pay | Admitting: Hematology

## 2022-06-10 ENCOUNTER — Inpatient Hospital Stay: Payer: Medicare Other | Attending: Hematology

## 2022-06-10 DIAGNOSIS — C7971 Secondary malignant neoplasm of right adrenal gland: Secondary | ICD-10-CM | POA: Insufficient documentation

## 2022-06-10 DIAGNOSIS — C61 Malignant neoplasm of prostate: Secondary | ICD-10-CM | POA: Diagnosis present

## 2022-06-10 LAB — MAGNESIUM: Magnesium: 1.6 mg/dL — ABNORMAL LOW (ref 1.7–2.4)

## 2022-06-10 LAB — COMPREHENSIVE METABOLIC PANEL
ALT: 14 U/L (ref 0–44)
AST: 19 U/L (ref 15–41)
Albumin: 3.6 g/dL (ref 3.5–5.0)
Alkaline Phosphatase: 55 U/L (ref 38–126)
Anion gap: 8 (ref 5–15)
BUN: 21 mg/dL (ref 8–23)
CO2: 23 mmol/L (ref 22–32)
Calcium: 9.2 mg/dL (ref 8.9–10.3)
Chloride: 104 mmol/L (ref 98–111)
Creatinine, Ser: 1.17 mg/dL (ref 0.61–1.24)
GFR, Estimated: 58 mL/min — ABNORMAL LOW (ref >60)
Glucose, Bld: 96 mg/dL (ref 70–99)
Potassium: 3.7 mmol/L (ref 3.5–5.1)
Sodium: 135 mmol/L (ref 135–145)
Total Bilirubin: 0.9 mg/dL (ref 0.3–1.2)
Total Protein: 6.9 g/dL (ref 6.5–8.1)

## 2022-06-10 LAB — CBC WITH DIFFERENTIAL/PLATELET
Abs Immature Granulocytes: 0.02 10*3/uL (ref 0.00–0.07)
Basophils Absolute: 0 10*3/uL (ref 0.0–0.1)
Basophils Relative: 0 %
Eosinophils Absolute: 0 10*3/uL (ref 0.0–0.5)
Eosinophils Relative: 1 %
HCT: 33.9 % — ABNORMAL LOW (ref 39.0–52.0)
Hemoglobin: 11.4 g/dL — ABNORMAL LOW (ref 13.0–17.0)
Immature Granulocytes: 1 %
Lymphocytes Relative: 22 %
Lymphs Abs: 0.9 10*3/uL (ref 0.7–4.0)
MCH: 34.2 pg — ABNORMAL HIGH (ref 26.0–34.0)
MCHC: 33.6 g/dL (ref 30.0–36.0)
MCV: 101.8 fL — ABNORMAL HIGH (ref 80.0–100.0)
Monocytes Absolute: 0.3 10*3/uL (ref 0.1–1.0)
Monocytes Relative: 7 %
Neutro Abs: 2.9 10*3/uL (ref 1.7–7.7)
Neutrophils Relative %: 69 %
Platelets: 223 10*3/uL (ref 150–400)
RBC: 3.33 MIL/uL — ABNORMAL LOW (ref 4.22–5.81)
RDW: 13.1 % (ref 11.5–15.5)
WBC: 4.2 10*3/uL (ref 4.0–10.5)
nRBC: 0 % (ref 0.0–0.2)

## 2022-06-10 LAB — PSA: Prostatic Specific Antigen: 1.17 ng/mL (ref 0.00–4.00)

## 2022-06-16 NOTE — Progress Notes (Signed)
[facility] [address], [place] [address]    Clinic Day:  06/17/2022  Referring physician: Center, [facility]*  Patient Care Team: Center, [facility] as PCP - General (General Practice) Therese Sarah, RN as Oncology Nurse Navigator (Oncology) Doreatha Massed, MD as Medical Oncologist (Medical Oncology)   ASSESSMENT & PLAN:   Assessment: 1.  Metastatic prostate cancer to the right adrenal gland: -Presentation to the ER with worsening right-sided back pain, which has been on and off for the last couple of years. - Some decrease in appetite with 6 pound weight loss in the last 6 months.  No hematuria or other B symptoms. - CT renal study on 05/03/2020 showed new right adrenal mass with bilateral renal lesions.  Indeterminate L2 vertebral bodies sclerotic lesion.  Low-attenuation lesions in the kidneys measure up to 2.3 cm on the right and were similar to scan from July 2021. - CT CAP on 06/12/2020 did not show any evidence of nodal metastasis or metastatic disease in the chest.  Right adrenal mass measures 4.2 x 3.8 cm exerts mass-effect upon the posterior and right lateral wall of the IVC.  Right kidney masses measuring 2.3 x 2.3 cm and 1.9 x 1.7 cm suspicious for RCC.  Upper pole of the left kidney lesion 2.2 x 2.0 cm and medial cortex of the inferior pole measuring 2 x 2 cm and another interpolar left kidney lesion measuring 1.3 x 1.5 cm. - Bone scan on 06/13/2020 was negative. - Right adrenal biopsy consistent with prostatic adenocarcinoma - Degarelix on 07/16/2020, transition to Eligard 45 mg on 08/21/2020. - Abiraterone 100 mg daily/prednisone 5 mg daily started around 01/22/2021.,  Discontinued in April 2023.  Not a candidate for Erleada due to blood thinner. - Darolutamide 300 mg twice daily started on 06/19/2021, increased to 600 mg twice daily on 06/26/2021, cut back to 300 mg twice daily on 07/07/2021 due to decreased oral intake and tiredness.    2.  Social/family history: - He lives at home with his daughter.  He walks with a cane. - He mows lawn with a riding mower.  He has a garden and gross vegetables.  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  Quit smoking 25 years ago. - No family history of malignancies that he is aware of.    Plan: 1.  Metastatic prostate cancer to the right adrenal gland: - Last Eligard 45 mg on 02/26/2022. - He is able to do his day-to-day activities including driving.  He has not had any falls in the last 3 months. - Reviewed labs: Normal LFTs.  CBC was grossly normal with mild anemia.  PSA is 1.17, increased from 0.44 in January.  PSA in October was 1.31. - I have recommended that he continue darolutamide 300 mg twice daily.  He could not tolerate 600 mg dose due to tiredness.  I will reevaluate him in 3 months with repeat PSA. - He is reportedly taking prednisone 5 mg daily with breakfast.  I have recommended that he cut back on prednisone 5 mg every other day.   2.  Right sided back pain: - Continue tramadol as needed.  He is not requiring daily.  3.  Macrocytic anemia: - Combination anemia from chronic inflammation from malignancy and old age. - Ferritin is 318, percent saturation is 50 and hemoglobin is 11.4.  No orders of the defined types were placed in this encounter.     I,Katie Daubenspeck,acting as  a scribe for Doreatha Massed, MD.,have documented all relevant documentation on the behalf of Doreatha Massed, MD,as directed by  Doreatha Massed, MD while in the presence of Doreatha Massed, MD.   I, Doreatha Massed MD, have reviewed the above documentation for accuracy and completeness, and I agree with the above.   Doreatha Massed, MD   5/1/202412:03 PM  CHIEF COMPLAINT:   Diagnosis: metastatic prostate cancer to the right adrenal gland    Cancer Staging  Prostate cancer [facility] Staging form: Prostate, AJCC 8th Edition - Clinical stage from 07/10/2020:  Stage IVB (cTX, cNX, pM1c, PSA: 10.2) - Unsigned    Prior Therapy: Abiraterone 01/22/21 - 05/2021  Current Therapy:  Lupron every 6 months, darolutamide   HISTORY OF PRESENT ILLNESS:   Oncology History   No history exists.     INTERVAL HISTORY:   Dorthy is a 89 y.o. male presenting to clinic today for follow up of metastatic prostate cancer to the right adrenal gland. He was last seen by me on 03/12/22.  Today, he states that he is doing well overall. His appetite level is at 100%. His energy level is at 70%.  PAST MEDICAL HISTORY:   Past Medical History: Past Medical History:  Diagnosis Date   Acid reflux    Acute colitis 09/16/2019   AKI (acute kidney injury) (HCC) 07/31/2012   Anemia    BENIGN PROSTATIC HYPERTROPHY, WITH OBSTRUCTION 12/18/2005   Qualifier: Diagnosis of  By: Magdalene River     Chest pain 07/31/2012   Colitis 09/16/2019   Colon cancer (HCC)    per patient, colon cancer more than 10 years ago s/p right hemicolectomy   Elevated bilirubin    Elevated LFTs    Ex-cigarette smoker    Gout    Hyperlipemia    Hypertension    Hyponatremia 08/01/2021   Intractable abdominal pain 02/03/2021   LATERAL MENISCUS TEAR, RIGHT 04/19/2006   Qualifier: Diagnosis of  By: Erby Pian MD, Cornelius     Leukocytosis, unspecified 07/31/2012   Paroxysmal atrial fibrillation (HCC) 2014   Presence of permanent cardiac pacemaker    Prostate cancer (HCC) 08/08/2013   SEEDS 20 YEARS AGO   Prostate disease    RENAL CALCULUS 12/18/2005   Qualifier: Diagnosis of  By: Magdalene River     RUQ pain    UTI (urinary tract infection) 03/26/2021    Surgical History: Past Surgical History:  Procedure Laterality Date   BIOPSY  02/05/2021   Procedure: BIOPSY;  Surgeon: Dolores Frame, MD;  [facility];  Service: Gastroenterology;;   BIOPSY  08/02/2021   Procedure: BIOPSY;  Surgeon: Corbin Ade, MD;  [facility]: [facility];  Service: Endoscopy;;   COLON  SURGERY     Right hemicolectomy in the setting of colon cancer, per patient   CYSTOSCOPY WITH INSERTION OF UROLIFT N/A 05/19/2021   Procedure: CYSTOSCOPY WITH INSERTION OF UROLIFT;  Surgeon: Malen Gauze, MD;  Location: [facility];  Service: Urology;  Laterality: N/A;   ENTEROSCOPY N/A 11/21/2021   Procedure: PUSH ENTEROSCOPY;  Surgeon: Dolores Frame, MD;  [facility];  Service: Gastroenterology;  Laterality: N/A;   ESOPHAGOGASTRODUODENOSCOPY (EGD) WITH PROPOFOL N/A 02/05/2021   Procedure: ESOPHAGOGASTRODUODENOSCOPY (EGD) WITH PROPOFOL;  Surgeon: Dolores Frame, MD;  [facility];  Service: Gastroenterology;  Laterality: N/A;   ESOPHAGOGASTRODUODENOSCOPY (EGD) WITH PROPOFOL N/A 08/02/2021   Procedure: ESOPHAGOGASTRODUODENOSCOPY (EGD) WITH PROPOFOL;  Surgeon: Corbin Ade, MD;  [facility];  Service: Endoscopy;  Laterality: N/A;   ESOPHAGOGASTRODUODENOSCOPY (EGD) WITH PROPOFOL N/A 11/21/2021   Procedure: ESOPHAGOGASTRODUODENOSCOPY (EGD) WITH PROPOFOL;  Surgeon: Dolores Frame, MD;  [facility];  Service: Gastroenterology;  Laterality: N/A;  1200  ASA 3   HOT HEMOSTASIS  08/02/2021   Procedure: HOT HEMOSTASIS (ARGON PLASMA COAGULATION/BICAP);  Surgeon: Corbin Ade, MD;  [facility];  Service: Endoscopy;;   HOT HEMOSTASIS  11/21/2021   Procedure: HOT HEMOSTASIS (ARGON PLASMA COAGULATION/BICAP);  Surgeon: Marguerita Merles, Reuel Boom, MD;  [facility];  Service: Gastroenterology;;   LEFT HEART CATHETERIZATION WITH CORONARY ANGIOGRAM N/A 07/21/2012   Procedure: LEFT HEART CATHETERIZATION WITH CORONARY ANGIOGRAM;  Surgeon: Lennette Bihari, MD;  Location: [facility] C[facility];  Service: Cardiovascular;  Laterality: N/A;   PACEMAKER INSERTION     PERMANENT PACEMAKER INSERTION N/A 07/22/2012   Procedure: PERMANENT PACEMAKER INSERTION;  Surgeon: Thurmon Fair, MD;  Location: [facility] CATH LAB;  Service:  Cardiovascular;  Laterality: N/A;   PROSTATE SURGERY      Social History: Social History   Socioeconomic History   Marital status: Married    Spouse name: Not on file   Number of children: Not on file   Years of education: Not on file   Highest education level: Not on file  Occupational History   Not on file  Tobacco Use   Smoking status: Former    Types: Cigarettes    Quit date: 07/20/2002    Years since quitting: 19.9    Passive exposure: Past   Smokeless tobacco: Former    Types: Associate Professor Use: Never used  Substance and Sexual Activity   Alcohol use: No   Drug use: No   Sexual activity: Not on file  Other Topics Concern   Not on file  Social History Narrative   Not on file   Social Determinants of Health   Financial Resource Strain: Low Risk  (05/16/2020)   Overall Financial Resource Strain (CARDIA)    Difficulty of Paying Living Expenses: Not hard at all  Food Insecurity: No Food Insecurity (05/16/2020)   Hunger Vital Sign    Worried About Running Out of Food in the Last Year: Never true    Ran Out of Food in the Last Year: Never true  Transportation Needs: No Transportation Needs (05/16/2020)   PRAPARE - Administrator, Civil Service (Medical): No    Lack of Transportation (Non-Medical): No  Physical Activity: Insufficiently Active (05/16/2020)   Exercise Vital Sign    Days of Exercise per Week: 3 days    Minutes of Exercise per Session: 30 min  Stress: No Stress Concern Present (05/16/2020)   [employer] of Occupational Health - Occupational Stress Questionnaire    Feeling of Stress : Not at all  Social Connections: Moderately Integrated (05/16/2020)   Social Connection and Isolation Panel [NHANES]    Frequency of Communication with Friends and Family: More than three times a week    Frequency of Social Gatherings with Friends and Family: More than three times a week    Attends Religious Services: More than 4 times per year     Active Member of Clubs or Organizations: No    Attends Banker Meetings: 1 to 4 times per year    Marital Status: Widowed  Intimate Partner Violence: Not At Risk (05/16/2020)   Humiliation, Afraid, Rape, and Kick questionnaire    Fear of Current or Ex-Partner: No  Emotionally Abused: No    Physically Abused: No    Sexually Abused: No    Family History: Family History  Problem Relation Age of Onset   Cancer Neg Hx     Current Medications:  Current Outpatient Medications:    acetaminophen (TYLENOL) 650 MG CR tablet, Take 1,300 mg by mouth every 8 (eight) hours as needed for pain., Disp: , Rfl:    amLODipine (NORVASC) 10 MG tablet, Take 1 tablet by mouth daily., Disp: , Rfl:    carvedilol (COREG) 3.125 MG tablet, Take 3.125 mg by mouth 2 (two) times daily with a meal., Disp: , Rfl:    darolutamide (NUBEQA) 300 MG tablet, Take 1 tablet (300 mg total) by mouth 2 (two) times daily with a meal., Disp: 60 tablet, Rfl: 0   diclofenac sodium (VOLTAREN) 1 % GEL, Apply 2 g topically 4 (four) times daily., Disp: 100 g, Rfl: 0   Ensure (ENSURE), Take 237 mLs by mouth daily as needed (nutrition). Vanilla or chocolate, Disp: , Rfl:    ferrous sulfate 324 MG TBEC, Take 324 mg by mouth daily., Disp: , Rfl:    furosemide (LASIX) 20 MG tablet, TAKE ONE TABLET BY MOUTH EVERY OTHER DAY FOR ACCUMULATION OF FLUID, Disp: , Rfl:    lisinopril (ZESTRIL) 10 MG tablet, Take 1 tablet by mouth daily., Disp: , Rfl:    pantoprazole (PROTONIX) 40 MG tablet, Take 1 tablet (40 mg total) by mouth daily., Disp: 90 tablet, Rfl: 3   predniSONE (DELTASONE) 5 MG tablet, TAKE ONE TABLET BY MOUTH ONCE DAILY WITH BREAKFAST., Disp: 30 tablet, Rfl: 0   tamsulosin (FLOMAX) 0.4 MG CAPS capsule, Take 1 capsule (0.4 mg total) by mouth daily after supper., Disp: 30 capsule, Rfl: 11   traMADol (ULTRAM) 50 MG tablet, Take 1 tablet (50 mg total) by mouth at bedtime as needed., Disp: 30 tablet, Rfl: 0 No current  facility-administered medications for this visit.  Facility-Administered Medications Ordered in Other Visits:    octreotide (SANDOSTATIN LAR) 20 MG IM injection, , , ,    Allergies: No Known Allergies  REVIEW OF SYSTEMS:   Review of Systems  Constitutional:  Negative for chills, fatigue and fever.  HENT:   Negative for lump/mass, mouth sores, nosebleeds, sore throat and trouble swallowing.   Eyes:  Negative for eye problems.  Respiratory:  Negative for cough and shortness of breath.   Cardiovascular:  Negative for chest pain, leg swelling and palpitations.  Gastrointestinal:  Negative for abdominal pain, constipation, diarrhea, nausea and vomiting.  Genitourinary:  Negative for bladder incontinence, difficulty urinating, dysuria, frequency, hematuria and nocturia.   Musculoskeletal:  Positive for back pain. Negative for arthralgias, flank pain, myalgias and neck pain.  Skin:  Negative for itching and rash.  Neurological:  Negative for dizziness, headaches and numbness.  Hematological:  Does not bruise/bleed easily.  Psychiatric/Behavioral:  Negative for depression, sleep disturbance and suicidal ideas. The patient is not nervous/anxious.   All other systems reviewed and are negative.    VITALS:   Blood pressure (!) 161/72, pulse 65, temperature 98.3 F (36.8 C), temperature source Oral, resp. rate 17.  Wt Readings from Last 3 Encounters:  03/12/22 184 lb (83.5 kg)  01/19/22 181 lb 6.4 oz (82.3 kg)  12/11/21 177 lb 14.6 oz (80.7 kg)    There is no height or weight on file to calculate BMI.  Performance status (ECOG): 1 - Symptomatic but completely ambulatory  PHYSICAL EXAM:   Physical Exam Vitals and nursing  note reviewed. Exam conducted with a chaperone present.  Constitutional:      Appearance: Normal appearance.  Cardiovascular:     Rate and Rhythm: Normal rate and regular rhythm.     Pulses: Normal pulses.     Heart sounds: Normal heart sounds.  Pulmonary:      Effort: Pulmonary effort is normal.     Breath sounds: Normal breath sounds.  Abdominal:     Palpations: Abdomen is soft. There is no hepatomegaly, splenomegaly or mass.     Tenderness: There is no abdominal tenderness.  Musculoskeletal:     Right lower leg: No edema.     Left lower leg: No edema.  Lymphadenopathy:     Cervical: No cervical adenopathy.     Right cervical: No superficial, deep or posterior cervical adenopathy.    Left cervical: No superficial, deep or posterior cervical adenopathy.     Upper Body:     Right upper body: No supraclavicular or axillary adenopathy.     Left upper body: No supraclavicular or axillary adenopathy.  Neurological:     General: No focal deficit present.     Mental Status: He is alert and oriented to person, place, and time.  Psychiatric:        Mood and Affect: Mood normal.        Behavior: Behavior normal.     LABS:      Latest Ref Rng & Units 06/10/2022    9:50 AM 03/05/2022    1:27 PM 12/03/2021    9:20 AM  CBC  WBC 4.0 - 10.5 K/uL 4.2  4.8  4.3   Hemoglobin 13.0 - 17.0 g/dL 16.1  09.6  04.5   Hematocrit 39.0 - 52.0 % 33.9  33.6  35.2   Platelets 150 - 400 K/uL 223  239  209       Latest Ref Rng & Units 06/10/2022    9:50 AM 03/05/2022    1:27 PM 12/03/2021    9:20 AM  CMP  Glucose 70 - 99 mg/dL 96  409  811   BUN 8 - 23 mg/dL 21  17  20    Creatinine 0.61 - 1.24 mg/dL 9.14  7.82  9.56   Sodium 135 - 145 mmol/L 135  133  134   Potassium 3.5 - 5.1 mmol/L 3.7  3.6  3.8   Chloride 98 - 111 mmol/L 104  100  109   CO2 22 - 32 mmol/L 23  25  20    Calcium 8.9 - 10.3 mg/dL 9.2  8.9  8.8   Total Protein 6.5 - 8.1 g/dL 6.9  6.9  6.7   Total Bilirubin 0.3 - 1.2 mg/dL 0.9  0.7  1.0   Alkaline Phos 38 - 126 U/L 55  62  56   AST 15 - 41 U/L 19  21  22    ALT 0 - 44 U/L 14  5  18       Lab Results  Component Value Date   CEA 1.2 12/23/2007   /  CEA  Date Value Ref Range Status  12/23/2007 1.2 0.0 - 5.0 ng/mL Final   No results  found for: "PSA1" No results found for: "OZH086" No results found for: "CAN125"  No results found for: "TOTALPROTELP", "ALBUMINELP", "A1GS", "A2GS", "BETS", "BETA2SER", "GAMS", "MSPIKE", "SPEI" Lab Results  Component Value Date   TIBC 229 (L) 04/28/2022   TIBC 217 (L) 01/20/2022   TIBC 292 12/11/2020   FERRITIN 318 04/28/2022  FERRITIN 312 01/20/2022   FERRITIN 224 12/11/2020   IRONPCTSAT 50 04/28/2022   IRONPCTSAT 62 (H) 01/20/2022   IRONPCTSAT 41 (H) 12/11/2020   Lab Results  Component Value Date   LDH 177 10/24/2020   LDH 192 09/10/2020   LDH 198 (H) 08/06/2020     STUDIES:   No results found.

## 2022-06-17 ENCOUNTER — Inpatient Hospital Stay: Payer: Medicare Other | Attending: Hematology | Admitting: Hematology

## 2022-06-17 ENCOUNTER — Other Ambulatory Visit: Payer: Self-pay | Admitting: *Deleted

## 2022-06-17 VITALS — BP 161/72 | HR 65 | Temp 98.3°F | Resp 17

## 2022-06-17 DIAGNOSIS — C7971 Secondary malignant neoplasm of right adrenal gland: Secondary | ICD-10-CM | POA: Diagnosis not present

## 2022-06-17 DIAGNOSIS — D539 Nutritional anemia, unspecified: Secondary | ICD-10-CM

## 2022-06-17 DIAGNOSIS — C61 Malignant neoplasm of prostate: Secondary | ICD-10-CM

## 2022-06-17 DIAGNOSIS — D630 Anemia in neoplastic disease: Secondary | ICD-10-CM | POA: Insufficient documentation

## 2022-06-17 NOTE — Patient Instructions (Addendum)
[facility] at [facility] Discharge Instructions   You were seen and examined today by Dr. Ellin Saba.  He reviewed the results of your lab work which is mostly normal/stable. Your PSA is normal, but has increased from the previous level of 0.44 to 1.17.   Continue Nubeqa as prescribed.   Return as scheduled.    Thank you for choosing [facility] at [facility] to provide your oncology and hematology care.  To afford each patient quality time with our provider, please arrive at least 15 minutes before your scheduled appointment time.   If you have a lab appointment with the Cancer Center please come in thru the Main Entrance and check in at the main information desk.  You need to re-schedule your appointment should you arrive 10 or more minutes late.  We strive to give you quality time with our providers, and arriving late affects you and other patients whose appointments are after yours.  Also, if you no show three or more times for appointments you may be dismissed from the clinic at the providers discretion.     Again, thank you for choosing [facility].  Our hope is that these requests will decrease the amount of time that you wait before being seen by our physicians.       _____________________________________________________________  Should you have questions after your visit to [facility], please contact our office at [phone] and follow the prompts.  Our office hours are 8:00 a.m. and 4:30 p.m. Monday - Friday.  Please note that voicemails left after 4:00 p.m. may not be returned until the following business day.  We are closed weekends and major holidays.  You do have access to a nurse 24-7, just call the main number to the clinic [phone] and do not press any options, hold on the line and a nurse will answer the phone.    For prescription refill requests, have your pharmacy contact our office and allow  72 hours.    Due to Covid, you will need to wear a mask upon entering the hospital. If you do not have a mask, a mask will be given to you at the Main Entrance upon arrival. For doctor visits, patients may have 1 support person age 64 or older with them. For treatment visits, patients can not have anyone with them due to social distancing guidelines and our immunocompromised population.

## 2022-06-24 ENCOUNTER — Other Ambulatory Visit (HOSPITAL_COMMUNITY): Payer: Self-pay

## 2022-06-24 ENCOUNTER — Other Ambulatory Visit: Payer: Self-pay | Admitting: Hematology

## 2022-06-24 ENCOUNTER — Other Ambulatory Visit: Payer: Self-pay

## 2022-06-24 ENCOUNTER — Other Ambulatory Visit: Payer: Self-pay | Admitting: *Deleted

## 2022-06-24 DIAGNOSIS — C61 Malignant neoplasm of prostate: Secondary | ICD-10-CM

## 2022-06-24 MED ORDER — NUBEQA 300 MG PO TABS
300.0000 mg | ORAL_TABLET | Freq: Two times a day (BID) | ORAL | 0 refills | Status: DC
Start: 2022-06-24 — End: 2022-07-28
  Filled 2022-06-24: qty 60, 30d supply, fill #0

## 2022-06-24 NOTE — Telephone Encounter (Signed)
Nubeqa refill approved.  Patient tolerating and is to continue therapy.

## 2022-06-30 ENCOUNTER — Other Ambulatory Visit (HOSPITAL_COMMUNITY): Payer: Self-pay

## 2022-07-03 ENCOUNTER — Other Ambulatory Visit: Payer: Self-pay

## 2022-07-03 ENCOUNTER — Other Ambulatory Visit (HOSPITAL_COMMUNITY): Payer: Self-pay

## 2022-07-13 ENCOUNTER — Other Ambulatory Visit: Payer: Self-pay | Admitting: Hematology

## 2022-07-14 ENCOUNTER — Encounter (HOSPITAL_COMMUNITY): Payer: Self-pay | Admitting: Hematology

## 2022-07-22 ENCOUNTER — Other Ambulatory Visit (HOSPITAL_COMMUNITY): Payer: Self-pay

## 2022-07-27 ENCOUNTER — Telehealth: Payer: Self-pay

## 2022-07-27 ENCOUNTER — Encounter (INDEPENDENT_AMBULATORY_CARE_PROVIDER_SITE_OTHER): Payer: Self-pay | Admitting: Gastroenterology

## 2022-07-27 ENCOUNTER — Ambulatory Visit (INDEPENDENT_AMBULATORY_CARE_PROVIDER_SITE_OTHER): Payer: Medicare Other | Admitting: Gastroenterology

## 2022-07-27 VITALS — BP 143/75 | HR 70 | Temp 98.6°F | Ht 73.0 in | Wt 178.6 lb

## 2022-07-27 DIAGNOSIS — D500 Iron deficiency anemia secondary to blood loss (chronic): Secondary | ICD-10-CM | POA: Diagnosis not present

## 2022-07-27 MED ORDER — PANTOPRAZOLE SODIUM 40 MG PO TBEC: 40.0000 mg | DELAYED_RELEASE_TABLET | Freq: Every day | ORAL | 3 refills | Status: DC

## 2022-07-27 NOTE — Patient Instructions (Addendum)
Please continue Protonix 40mg  daily and iron pill every other day  Please let me know if you have any new or worsening GI issues, otherwise we will plan to see you again in 1 year

## 2022-07-27 NOTE — Telephone Encounter (Signed)
Patient's daughter called advising that her father feels the urge to urinate but he does not feel like he is emptying his bladder completely. Dr. Dimas Millin next available appt is 08/04/22 at 3:30. I wanted to see if the patient could come in for a NV  to scan his bladder.

## 2022-07-27 NOTE — Telephone Encounter (Signed)
Yes, patient can come in for PVR with ua/uc with nurse and keep scheduled apt with MD

## 2022-07-27 NOTE — Progress Notes (Signed)
Referring Provider: [facility] Primary Care Physician:  [facility] Primary GI Physician: Levon Hedger   Chief Complaint  Patient presents with   Gastroesophageal Reflux    Patient arrives with daughter Trixie Dredge for a follow up on GERD. Doing well and no concerns.    HPI:   Derrick Long is a 89 y.o. male with past medical history of GERD, anemia, BPH, gout, HTN, Hyperlipemia, a fib on Xarelto, metastatic prostate cancer, colon cancer.   Patient presenting today for follow up of IDA  Last seen December 2024, at that time most recent labs in October with hgb 11.9, denied rectal bleeding or melena. Having a BM daily. Unsure if he was taking protonix or not.   Recommended to repeat Iron studies, make sure to take PPI daily, continue Iron daily.   Iron studies in December with TIBC 217, Iron 1342, saturation 65%, ferritin 312, advised to take iron pill every other day.   Most recent iron studies in march via hematology with TBC 229, Iron 114, Iron sat 50, Ferritin 318, he is also followed by hematology/oncology for his history of prostate cancer, last saw them in May  Hgb in April was 11.4 (baseline 10-11 range)  Present:  Taking iron every other day. Denies rectal bleeding. Stools are darker sometimes on his iron. No abdominal pain. Appetite is good. Weight is stable. No nausea or vomiting. No GERD symptoms. He feels good from a GI standpoint  Small bowel endoscopy: 11/2021 - 1 cm hiatal hernia. - Dark pigementation mucosa in the cardia. Biopsied.(mild reactive reparative changes at the squamocolumnar junction but no other abnormalities)  - Erosive gastropathy with no stigmata of recent bleeding. - A single non-bleeding angiodysplastic lesion in the stomach. Treated with argon plasma coagulation (APC). - Normal examined duodenum.- Two non-bleeding angioectasias in the jejunum. Treated with argon plasma coagulation(APC). Last Colonoscopy:[facility] at  [facility]  Last Endoscopy:08/02/21-Normal esophagus. Inflamed appearing gastric mucosa. 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   Past Medical History:  Diagnosis Date   Acid reflux    Acute colitis 09/16/2019   AKI (acute kidney injury) (HCC) 07/31/2012   Anemia    BENIGN PROSTATIC HYPERTROPHY, WITH OBSTRUCTION 12/18/2005   Qualifier: Diagnosis of  By: Magdalene River     Chest pain 07/31/2012   Colitis 09/16/2019   Colon cancer (HCC)    per patient, colon cancer more than 10 years ago s/p right hemicolectomy   Elevated bilirubin    Elevated LFTs    Ex-cigarette smoker    Gout    Hyperlipemia    Hypertension    Hyponatremia 08/01/2021   Intractable abdominal pain 02/03/2021   LATERAL MENISCUS TEAR, RIGHT 04/19/2006   Qualifier: Diagnosis of  By: Erby Pian MD, Cornelius     Leukocytosis, unspecified 07/31/2012   Paroxysmal atrial fibrillation (HCC) 2014   Presence of permanent cardiac pacemaker    Prostate cancer (HCC) 08/08/2013   SEEDS 20 YEARS AGO   Prostate disease    RENAL CALCULUS 12/18/2005   Qualifier: Diagnosis of  By: Magdalene River     RUQ pain    UTI (urinary tract infection) 03/26/2021    Past Surgical History:  Procedure Laterality Date   BIOPSY  02/05/2021   Procedure: BIOPSY;  Surgeon: Dolores Frame, MD;  [facility];  Service: Gastroenterology;;   BIOPSY  08/02/2021   Procedure: BIOPSY;  Surgeon: Corbin Ade, MD;  Location: [facility];  Service: Endoscopy;;   COLON SURGERY     Right hemicolectomy in the setting of colon cancer, per patient   CYSTOSCOPY WITH INSERTION OF UROLIFT N/A 05/19/2021   Procedure: CYSTOSCOPY WITH INSERTION OF UROLIFT;  Surgeon: Malen Gauze, MD;  Location: [facility];  Service: Urology;  Laterality: N/A;   ENTEROSCOPY N/A 11/21/2021    Procedure: PUSH ENTEROSCOPY;  Surgeon: Dolores Frame, MD;  [facility];  Service: Gastroenterology;  Laterality: N/A;   ESOPHAGOGASTRODUODENOSCOPY (EGD) WITH PROPOFOL N/A 02/05/2021   Procedure: ESOPHAGOGASTRODUODENOSCOPY (EGD) WITH PROPOFOL;  Surgeon: Dolores Frame, MD;  [facility];  Service: Gastroenterology;  Laterality: N/A;   ESOPHAGOGASTRODUODENOSCOPY (EGD) WITH PROPOFOL N/A 08/02/2021   Procedure: ESOPHAGOGASTRODUODENOSCOPY (EGD) WITH PROPOFOL;  Surgeon: Corbin Ade, MD;  [facility];  Service: Endoscopy;  Laterality: N/A;   ESOPHAGOGASTRODUODENOSCOPY (EGD) WITH PROPOFOL N/A 11/21/2021   Procedure: ESOPHAGOGASTRODUODENOSCOPY (EGD) WITH PROPOFOL;  Surgeon: Dolores Frame, MD;  [facility];  Service: Gastroenterology;  Laterality: N/A;  1200  ASA 3   HOT HEMOSTASIS  08/02/2021   Procedure: HOT HEMOSTASIS (ARGON PLASMA COAGULATION/BICAP);  Surgeon: Corbin Ade, MD;  [facility]: [facility];  Service: Endoscopy;;   HOT HEMOSTASIS  11/21/2021   Procedure: HOT HEMOSTASIS (ARGON PLASMA COAGULATION/BICAP);  Surgeon: Marguerita Merles, Reuel Boom, MD;  [facility];  Service: Gastroenterology;;   LEFT HEART CATHETERIZATION WITH CORONARY ANGIOGRAM N/A 07/21/2012   Procedure: LEFT HEART CATHETERIZATION WITH CORONARY ANGIOGRAM;  Surgeon: Lennette Bihari, MD;  Location: [facility] CATH LAB;  Service: Cardiovascular;  Laterality: N/A;   PACEMAKER INSERTION     PERMANENT PACEMAKER INSERTION N/A 07/22/2012   Procedure: PERMANENT PACEMAKER INSERTION;  Surgeon: Thurmon Fair, MD;  Location: [facility] CATH [facility];  Service: Cardiovascular;  Laterality: N/A;   PROSTATE SURGERY      Current Outpatient Medications  Medication Sig Dispense Refill   pantoprazole (PROTONIX) 40 MG tablet Take 1 tablet (40 mg total) by mouth daily. 90 tablet 3   acetaminophen (TYLENOL) 650 MG CR tablet Take 1,300 mg by mouth every 8 (eight) hours as  needed for pain.     amLODipine (NORVASC) 10 MG tablet Take 1 tablet by mouth daily.     carvedilol (COREG) 3.125 MG tablet Take 3.125 mg by mouth 2 (two) times daily with a meal.     darolutamide (NUBEQA) 300 MG tablet Take 1 tablet (300 mg total) by mouth 2 (two) times daily with a meal. 60 tablet 0   diclofenac sodium (VOLTAREN) 1 % GEL Apply 2 g topically 4 (four) times daily. 100 g 0   Ensure (ENSURE) Take 237 mLs by mouth daily as needed (nutrition). Vanilla or chocolate     ferrous sulfate 324 MG TBEC Take 324 mg by mouth daily.     furosemide (LASIX) 20 MG tablet TAKE ONE TABLET BY MOUTH EVERY OTHER DAY FOR ACCUMULATION OF FLUID     lisinopril (ZESTRIL) 10 MG tablet Take 1 tablet by mouth daily.     predniSONE (DELTASONE) 5 MG tablet TAKE ONE TABLET BY MOUTH ONCE DAILY WITH BREAKFAST. 30 tablet 0   tamsulosin (FLOMAX) 0.4 MG CAPS capsule Take 1 capsule (0.4 mg total) by mouth daily after supper. 30 capsule 11   traMADol (ULTRAM) 50 MG tablet Take 1 tablet (50 mg total) by mouth at bedtime as needed. 30 tablet 0  No current facility-administered medications for this visit.   Facility-Administered Medications Ordered in Other Visits  Medication Dose Route Frequency Provider Last Rate Last Admin   octreotide (SANDOSTATIN LAR) 20 MG IM injection             Allergies as of 07/27/2022   (No Known Allergies)    Family History  Problem Relation Age of Onset   Cancer Neg Hx     Social History   Socioeconomic History   Marital status: Married    Spouse name: Not on file   Number of children: Not on file   Years of education: Not on file   Highest education level: Not on file  Occupational History   Not on file  Tobacco Use   Smoking status: Former    Types: Cigarettes    Quit date: 07/20/2002    Years since quitting: 20.0    Passive exposure: Past   Smokeless tobacco: Former    Types: Associate Professor Use: Never used  Substance and Sexual Activity   Alcohol  use: No   Drug use: No   Sexual activity: Not on file  Other Topics Concern   Not on file  Social History Narrative   Not on file   Social Determinants of Health   Financial Resource Strain: Low Risk  (05/16/2020)   Overall Financial Resource Strain (CARDIA)    Difficulty of Paying Living Expenses: Not hard at all  Food Insecurity: No Food Insecurity (05/16/2020)   Hunger Vital Sign    Worried About Running Out of Food in the Last Year: Never true    Ran Out of Food in the Last Year: Never true  Transportation Needs: No Transportation Needs (05/16/2020)   PRAPARE - Administrator, Civil Service (Medical): No    Lack of Transportation (Non-Medical): No  Physical Activity: Insufficiently Active (05/16/2020)   Exercise Vital Sign    Days of Exercise per Week: 3 days    Minutes of Exercise per Session: 30 min  Stress: No Stress Concern Present (05/16/2020)   [employer] of Occupational Health - Occupational Stress Questionnaire    Feeling of Stress : Not at all  Social Connections: Moderately Integrated (05/16/2020)   Social Connection and Isolation Panel [NHANES]    Frequency of Communication with Friends and Family: More than three times a week    Frequency of Social Gatherings with Friends and Family: More than three times a week    Attends Religious Services: More than 4 times per year    Active Member of Clubs or Organizations: No    Attends Banker Meetings: 1 to 4 times per year    Marital Status: Widowed   Review of systems General: negative for malaise, night sweats, fever, chills, weight loss Neck: Negative for lumps, goiter, pain and significant neck swelling Resp: Negative for cough, wheezing, dyspnea at rest CV: Negative for chest pain, leg swelling, palpitations, orthopnea GI: denies melena, hematochezia, nausea, vomiting, diarrhea, constipation, dysphagia, odyonophagia, early satiety or unintentional weight loss.  MSK: Negative for joint  pain or swelling, back pain, and muscle pain. Derm: Negative for itching or rash Psych: Denies depression, anxiety, memory loss, confusion. No homicidal or suicidal ideation.  Heme: Negative for prolonged bleeding, bruising easily, and swollen nodes. Endocrine: Negative for cold or heat intolerance, polyuria, polydipsia and goiter. Neuro: negative for tremor, gait imbalance, syncope and seizures. The remainder of the review of systems is noncontributory.  Physical  Exam: There were no vitals taken for this visit. General:   Alert and oriented. No distress noted. Pleasant and cooperative.  Head:  Normocephalic and atraumatic. Eyes:  Conjuctiva clear without scleral icterus. Mouth:  Oral mucosa pink and moist. Good dentition. No lesions. Heart: Normal rate and rhythm, s1 and s2 heart sounds present.  Lungs: Clear lung sounds in all lobes. Respirations equal and unlabored. Abdomen:  +BS, soft, non-tender and non-distended. No rebound or guarding. No HSM or masses noted. Derm: No palmar erythema or jaundice Msk:  Symmetrical without gross deformities. Normal posture. Extremities:  Without edema. Neurologic:  Alert and  oriented x4 Psych:  Alert and cooperative. Normal mood and affect.  Invalid input(s): "6 MONTHS"   ASSESSMENT: ARTICE MINSKY is a 89 y.o. male presenting today for follow up of IDA  History of IDA, Small bowel endoscopy in October 2023 with multiple angiectasias/angiodysplastic lesions s/p APC, melena and IDA thought secondary to these lesions/combination of age/presence of malignancy. He also follows with hematology/oncology. No colonoscopy recommended at that time. Patient doing well today. Last hgb was 11.4 in April which appears to be around baseline for him. He has no melena or rectal bleeding, no sob, dizziness of fatigue. Appetite and weight are stable. Most recent Iron studies in May were stable, we will continue with daily PPI and Iron pill every other day.    PLAN:  Continue with protonix 40mg  daily  2.  Continue with PO Iron every other day  3. Pt to let me know of any new or worsening GI issues   All questions were answered, patient verbalized understanding and is in agreement with plan as outlined above.   Follow Up: 1 year   Bronsen Serano L. Jeanmarie Hubert, MSN, APRN, AGNP-C Adult-Gerontology Nurse Practitioner [facility]  I have reviewed the note and agree with the APP's assessment as described in this progress note  Katrinka Blazing, MD Gastroenterology and Hepatology [facility]

## 2022-07-28 ENCOUNTER — Other Ambulatory Visit: Payer: Self-pay | Admitting: Hematology

## 2022-07-28 ENCOUNTER — Other Ambulatory Visit (HOSPITAL_COMMUNITY): Payer: Self-pay

## 2022-07-28 ENCOUNTER — Other Ambulatory Visit: Payer: Self-pay | Admitting: *Deleted

## 2022-07-28 ENCOUNTER — Other Ambulatory Visit: Payer: Self-pay

## 2022-07-28 DIAGNOSIS — C61 Malignant neoplasm of prostate: Secondary | ICD-10-CM

## 2022-07-28 MED ORDER — NUBEQA 300 MG PO TABS
300.0000 mg | ORAL_TABLET | Freq: Two times a day (BID) | ORAL | 0 refills | Status: DC
Start: 2022-07-28 — End: 2022-08-25
  Filled 2022-07-28: qty 60, 30d supply, fill #0

## 2022-07-28 NOTE — Telephone Encounter (Signed)
Nubeqa refill approved.  Patient is tolerating and is to continue therapy.

## 2022-07-31 ENCOUNTER — Other Ambulatory Visit (HOSPITAL_COMMUNITY): Payer: Self-pay

## 2022-08-04 ENCOUNTER — Ambulatory Visit (INDEPENDENT_AMBULATORY_CARE_PROVIDER_SITE_OTHER): Payer: Medicare Other | Admitting: Urology

## 2022-08-04 ENCOUNTER — Other Ambulatory Visit (HOSPITAL_COMMUNITY): Payer: Self-pay

## 2022-08-04 VITALS — BP 137/71 | HR 74

## 2022-08-04 DIAGNOSIS — N3001 Acute cystitis with hematuria: Secondary | ICD-10-CM | POA: Diagnosis not present

## 2022-08-04 DIAGNOSIS — N138 Other obstructive and reflux uropathy: Secondary | ICD-10-CM

## 2022-08-04 DIAGNOSIS — R339 Retention of urine, unspecified: Secondary | ICD-10-CM | POA: Diagnosis not present

## 2022-08-04 DIAGNOSIS — N401 Enlarged prostate with lower urinary tract symptoms: Secondary | ICD-10-CM

## 2022-08-04 DIAGNOSIS — R300 Dysuria: Secondary | ICD-10-CM

## 2022-08-04 LAB — BLADDER SCAN AMB NON-IMAGING: Scan Result: 36

## 2022-08-04 MED ORDER — TAMSULOSIN HCL 0.4 MG PO CAPS: 0.4000 mg | ORAL_CAPSULE | Freq: Two times a day (BID) | ORAL | 11 refills | Status: DC

## 2022-08-04 NOTE — Progress Notes (Signed)
08/04/2022 4:06 PM   Derrick Long 01/24/1927 [id number]  Referring provider: Center, [facility] [address] [place],  [place] [address]  Followup BPH with urinary retention   HPI: Derrick Long is a 89yo here for followup for BPH with urinary retention. PVR 36cc. He is having to double void on flomax. IPSS 8 QOL 3 on flomax. Uirne stream strong. No straining to urinate. He has mild dysuria which started 1 week ago. UA is concerning for infection.    PMH: Past Medical History:  Diagnosis Date   Acid reflux    Acute colitis 09/16/2019   AKI (acute kidney injury) (HCC) 07/31/2012   Anemia    BENIGN PROSTATIC HYPERTROPHY, WITH OBSTRUCTION 12/18/2005   Qualifier: Diagnosis of  By: Magdalene River     Chest pain 07/31/2012   Colitis 09/16/2019   Colon cancer (HCC)    per patient, colon cancer more than 10 years ago s/p right hemicolectomy   Elevated bilirubin    Elevated LFTs    Ex-cigarette smoker    Gout    Hyperlipemia    Hypertension    Hyponatremia 08/01/2021   Intractable abdominal pain 02/03/2021   LATERAL MENISCUS TEAR, RIGHT 04/19/2006   Qualifier: Diagnosis of  By: Erby Pian MD, Cornelius     Leukocytosis, unspecified 07/31/2012   Paroxysmal atrial fibrillation (HCC) 2014   Presence of permanent cardiac pacemaker    Prostate cancer (HCC) 08/08/2013   SEEDS 20 YEARS AGO   Prostate disease    RENAL CALCULUS 12/18/2005   Qualifier: Diagnosis of  By: Magdalene River     RUQ pain    UTI (urinary tract infection) 03/26/2021    Surgical History: Past Surgical History:  Procedure Laterality Date   BIOPSY  02/05/2021   Procedure: BIOPSY;  Surgeon: Dolores Frame, MD;  [facility];  Service: Gastroenterology;;   BIOPSY  08/02/2021   Procedure: BIOPSY;  Surgeon: Corbin Ade, MD;  [facility];  Service: Endoscopy;;   COLON SURGERY     Right hemicolectomy in the setting of colon cancer, per patient   CYSTOSCOPY WITH INSERTION  OF UROLIFT N/A 05/19/2021   Procedure: CYSTOSCOPY WITH INSERTION OF UROLIFT;  Surgeon: Malen Gauze, MD;  Location: [facility];  Service: Urology;  Laterality: N/A;   ENTEROSCOPY N/A 11/21/2021   Procedure: PUSH ENTEROSCOPY;  Surgeon: Dolores Frame, MD;  [facility];  Service: Gastroenterology;  Laterality: N/A;   ESOPHAGOGASTRODUODENOSCOPY (EGD) WITH PROPOFOL N/A 02/05/2021   Procedure: ESOPHAGOGASTRODUODENOSCOPY (EGD) WITH PROPOFOL;  Surgeon: Dolores Frame, MD;  [facility];  Service: Gastroenterology;  Laterality: N/A;   ESOPHAGOGASTRODUODENOSCOPY (EGD) WITH PROPOFOL N/A 08/02/2021   Procedure: ESOPHAGOGASTRODUODENOSCOPY (EGD) WITH PROPOFOL;  Surgeon: Corbin Ade, MD;  [facility];  Service: Endoscopy;  Laterality: N/A;   ESOPHAGOGASTRODUODENOSCOPY (EGD) WITH PROPOFOL N/A 11/21/2021   Procedure: ESOPHAGOGASTRODUODENOSCOPY (EGD) WITH PROPOFOL;  Surgeon: Dolores Frame, MD;  [facility];  Service: Gastroenterology;  Laterality: N/A;  1200  ASA 3   HOT HEMOSTASIS  08/02/2021   Procedure: HOT HEMOSTASIS (ARGON PLASMA COAGULATION/BICAP);  Surgeon: Corbin Ade, MD;  [facility];  Service: Endoscopy;;   HOT HEMOSTASIS  11/21/2021   Procedure: HOT HEMOSTASIS (ARGON PLASMA COAGULATION/BICAP);  Surgeon: Marguerita Merles, Reuel Boom, MD;  [facility];  Service: Gastroenterology;;   LEFT HEART CATHETERIZATION WITH CORONARY ANGIOGRAM N/A 07/21/2012   Procedure: LEFT HEART CATHETERIZATION WITH CORONARY ANGIOGRAM;  Surgeon:  Lennette Bihari, MD;  Location: [facility] CATH LAB;  Service: Cardiovascular;  Laterality: N/A;   PACEMAKER INSERTION     PERMANENT PACEMAKER INSERTION N/A 07/22/2012   Procedure: PERMANENT PACEMAKER INSERTION;  Surgeon: Thurmon Fair, MD;  Location: [facility]ATH [facility];  Service: Cardiovascular;  Laterality: N/A;   PROSTATE SURGERY      Home Medications:  Allergies as of 08/04/2022   No Known  Allergies      Medication List        Accurate as of August 04, 2022  4:06 PM. If you have any questions, ask your nurse or doctor.          acetaminophen 650 MG CR tablet Commonly known as: TYLENOL Take 1,300 mg by mouth every 8 (eight) hours as needed for pain.   amLODipine 10 MG tablet Commonly known as: NORVASC Take 1 tablet by mouth daily.   carvedilol 3.125 MG tablet Commonly known as: COREG Take 3.125 mg by mouth 2 (two) times daily with a meal.   diclofenac sodium 1 % Gel Commonly known as: VOLTAREN Apply 2 g topically 4 (four) times daily.   Ensure Take 237 mLs by mouth daily as needed (nutrition). Vanilla or chocolate   ferrous sulfate 324 MG Tbec Take 324 mg by mouth. Takes one tablet every other day   furosemide 20 MG tablet Commonly known as: LASIX Take 20 mg by mouth. One half tablet daily   lisinopril 10 MG tablet Commonly known as: ZESTRIL Take 1 tablet by mouth daily.   Nubeqa 300 MG tablet Generic drug: darolutamide Take 1 tablet (300 mg total) by mouth 2 (two) times daily with a meal.   pantoprazole 40 MG tablet Commonly known as: PROTONIX Take 1 tablet (40 mg total) by mouth daily.   predniSONE 5 MG tablet Commonly known as: DELTASONE TAKE ONE TABLET BY MOUTH ONCE DAILY WITH BREAKFAST. What changed: See the new instructions.   tamsulosin 0.4 MG Caps capsule Commonly known as: FLOMAX Take 1 capsule (0.4 mg total) by mouth daily after supper.   traMADol 50 MG tablet Commonly known as: ULTRAM Take 1 tablet (50 mg total) by mouth at bedtime as needed.        Allergies: No Known Allergies  Family History: Family History  Problem Relation Age of Onset   Cancer Neg Hx     Social History:  reports that he quit smoking about 20 years ago. His smoking use included cigarettes. He has been exposed to tobacco smoke. He has quit using smokeless tobacco.  His smokeless tobacco use included chew. He reports that he does not drink alcohol  and does not use drugs.  ROS: All other review of systems were reviewed and are negative except what is noted above in HPI  Physical Exam: BP 137/71   Pulse 74   Constitutional:  Alert and oriented, No acute distress. HEENT: [place] AT, moist mucus membranes.  Trachea midline, no masses. Cardiovascular: No clubbing, cyanosis, or edema. Respiratory: Normal respiratory effort, no increased work of breathing. GI: Abdomen is soft, nontender, nondistended, no abdominal masses GU: No CVA tenderness.  Lymph: No cervical or inguinal lymphadenopathy. Skin: No rashes, bruises or suspicious lesions. Neurologic: Grossly intact, no focal deficits, moving all 4 extremities. Psychiatric: Normal mood and affect.  Laboratory Data: Lab Results  Component Value Date   WBC 4.2 06/10/2022   HGB 11.4 (L) 06/10/2022   HCT 33.9 (L) 06/10/2022   MCV 101.8 (H) 06/10/2022   PLT 223 06/10/2022  Lab Results  Component Value Date   CREATININE 1.17 06/10/2022    Lab Results  Component Value Date   PSA Refused 12/22/2007   PSA 0.07 (L) 06/24/2007   PSA 0.10 05/25/2006    No results found for: "TESTOSTERONE"  Lab Results  Component Value Date   HGBA1C 5.6 07/19/2012    Urinalysis    Component Value Date/Time   COLORURINE YELLOW 10/25/2021 2010   APPEARANCEUR Cloudy (A) 01/20/2022 1035   LABSPEC 1.017 10/25/2021 2010   PHURINE 6.0 10/25/2021 2010   GLUCOSEU Negative 01/20/2022 1035   HGBUR MODERATE (A) 10/25/2021 2010   HGBUR negative 06/24/2007 0833   BILIRUBINUR Negative 01/20/2022 1035   KETONESUR NEGATIVE 10/25/2021 2010   PROTEINUR 3+ (A) 01/20/2022 1035   PROTEINUR 100 (A) 10/25/2021 2010   UROBILINOGEN 1.0 07/29/2013 2032   NITRITE Positive (A) 01/20/2022 1035   NITRITE NEGATIVE 10/25/2021 2010   LEUKOCYTESUR 1+ (A) 01/20/2022 1035   LEUKOCYTESUR MODERATE (A) 10/25/2021 2010    Lab Results  Component Value Date   LABMICR See below: 01/20/2022   WBCUA >30 (A) 01/20/2022    LABEPIT 0-10 01/20/2022   MUCUS Present 07/17/2021   BACTERIA Many (A) 01/20/2022    Pertinent Imaging:  Results for orders placed in visit on 07/29/01  DG Abd 1 View  Narrative FINDINGS CLINICAL DATA:  PROSTATE CA. SINGLE VIEW ABDOMEN A NORMAL BOWEL GAS PATTERN IS DEMONSTRATED.  MILD TO MODERATE SPUR FORMATION IS DEMONSTRATED AT MULTIPLE LEVELS OF THE LUMBAR SPINE AS WELL AS MINIMAL LEVOCONVEX LUMBAR ROTARY SCOLIOSIS.  ALSO NOTED IS BRIDGING OSSIFICATION OR CALCIFICATION BETWEEN THE LOWER LUMBAR SPINE AND LEFT ILIAC WING. A TRANSITIONAL LUMBOSACRAL VERTEBRA IS ALSO DEMONSTRATED.  MILD ATHEROMATOUS ARTERIAL CALCIFICATIONS ARE NOTED. IMPRESSION NO ACUTE ABNORMALITY AND NO EVIDENCE OF BONY METASTATIC DISEASE.  No results found for this or any previous visit.  No results found for this or any previous visit.  No results found for this or any previous visit.  Results for orders placed during the hospital encounter of 08/01/13  US Renal  Narrative CLINICAL DATA:  Acute urinary retention  EXAM: RENAL/URINARY TRACT ULTRASOUND COMPLETE  COMPARISON:  04/14/2004  FINDINGS: Right Kidney:  Length: 10.2 cm. Complex 2.0 x 1.8 x 1.6 cm echogenic lesion, right kidney lower pole. We were not able the demonstrate definite blood flow within this lesion. Hypoechoic but minimally complex 2.6 x 2.2 x 1.9 cm lesion, right kidney lower pole.  Left Kidney:  Length: 1.3 cm. Mild prominence of renal sinus adipose tissue. Tiny probable cyst, left mid-upper kidney, 4 mm. Equivocal small mid kidney nonobstructive calculus.  Bladder:  Nondistended.  Catheter in urinary bladder.  IMPRESSION: 1. Complex 2 cm echogenic lesion, right kidney lower pole, in vicinity of prior enhancing nodule. In 2006 this lesion measured 1.4 cm. This may represent slow growth of a small renal cell carcinoma. 2. Minimally complex 2.6 cm right kidney lower pole lesion, most likely a complex cyst although  technically nonspecific. 3. Equivocal small left mid kidney nonobstructive calculus. Mild renal sinus adipose tissue prominence on the left.   Electronically Signed By: Herbie Baltimore M.D. On: 08/01/2013 15:54  No valid procedures specified. No results found for this or any previous visit.  Results for orders placed during the hospital encounter of 02/19/21  CT Renal Stone Study  Narrative CLINICAL DATA:  Urinary retention.  History of prostate cancer.  EXAM: CT ABDOMEN AND PELVIS WITHOUT CONTRAST  TECHNIQUE: Multidetector CT imaging of the abdomen and pelvis  was performed following the standard protocol without IV contrast.  COMPARISON:  02/03/2021.  FINDINGS: Lower chest: The heart is normal in size and scattered coronary artery calcifications are noted. Pacemaker leads are present in the heart. A 4 mm nodule is present in the right lower lobe, axial image 8.  Hepatobiliary: The liver is within normal limits. Mild intrahepatic and extrahepatic biliary ductal dilatation is seen which is likely due to patient's post cholecystectomy status.  Pancreas: Unremarkable. No pancreatic ductal dilatation or surrounding inflammatory changes.  Spleen: Normal in size without focal abnormality.  Adrenals/Urinary Tract: There is redemonstration of a right adrenal mass measuring 3.2 cm, unchanged. The left adrenal gland is within normal limits. Scattered hypo and hyperdense structures are present in the kidneys bilaterally. Complex masslike lesions are noted in the kidneys bilaterally measuring 2.6 cm in the mid left kidney and 2.6 cm in the mid right kidney. No renal or ureteral calculus or obstructive uropathy bilaterally. The bladder is unremarkable.  Stomach/Bowel: The stomach is nondistended. No bowel obstruction, free air or pneumatosis. A few scattered diverticular noted along the colon without evidence of diverticulitis. Right hemicolectomy changes are  noted.  Vascular/Lymphatic: Aortic atherosclerosis with ectasia of the distal abdominal aorta. 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 The common iliac artery on the left measures 1.8 cm. No abdominal or pelvic lymphadenopathy.  Reproductive: The prostate gland is enlarged and contains multiple radiopaque brachytherapy seeds.  Other: No free fluid. A small fat containing umbilical hernia is noted. There is a small right inguinal fat containing hernia.  Musculoskeletal: Degenerative changes are present in the thoracolumbar spine and bilateral hips. A stable sclerotic lesion is noted in the L2 vertebral body on the right, possible bone island.  IMPRESSION: 1. No renal or ureteral calculus or obstructive uropathy bilaterally. 2. Multiple hyperdense and hypodense lesions in the kidneys bilaterally. Complex masses are noted in the mid kidneys bilaterally, compatible with history of multifocal renal cell carcinoma and slightly increased in size from the prior exam. 3. Enlarged prostate gland containing brachytherapy seeds. 4. Right lower lobe pulmonary nodule. No follow-up needed if patient is low-risk. Non-contrast chest CT can be considered in 12 months if patient is high-risk. This recommendation follows the consensus statement: Guidelines for Management of Incidental Pulmonary Nodules Detected on CT Images: From the Fleischner Society 2017; Radiology 2017; 284:228-243. 5. Aortic atherosclerosis. 6. Coronary artery calcifications. 7. Remaining chronic findings as described above.   Electronically Signed By: Thornell Sartorius M.D. On: 02/20/2021 00:28   Assessment & Plan:    1. Benign prostatic hyperplasia with urinary obstruction Continue flomax 0.4mg  daily and double voiding  2. Urinary retention -continue flomax 0.4mg  daily - Urinalysis, Routine w reflex microscopic - BLADDER SCAN AMB NON-IMAGING  3. Acute cystitis with  hematuria - Urine Culture   No follow-ups on file.  Wilkie Aye, MD  [facility] [place]

## 2022-08-05 ENCOUNTER — Other Ambulatory Visit (HOSPITAL_COMMUNITY): Payer: Self-pay

## 2022-08-05 LAB — URINALYSIS, ROUTINE W REFLEX MICROSCOPIC
Bilirubin, UA: NEGATIVE
Glucose, UA: NEGATIVE
Nitrite, UA: NEGATIVE
RBC, UA: NEGATIVE
Specific Gravity, UA: 1.015 (ref 1.005–1.030)
Urobilinogen, Ur: 1 mg/dL (ref 0.2–1.0)
pH, UA: 5.5 (ref 5.0–7.5)

## 2022-08-05 LAB — MICROSCOPIC EXAMINATION: Epithelial Cells (non renal): >10 /hpf — AB (ref 0–10)

## 2022-08-07 LAB — URINE CULTURE

## 2022-08-11 ENCOUNTER — Encounter: Payer: Self-pay | Admitting: Urology

## 2022-08-11 NOTE — Progress Notes (Signed)
Letter sent.

## 2022-08-11 NOTE — Patient Instructions (Signed)

## 2022-08-25 ENCOUNTER — Other Ambulatory Visit: Payer: Self-pay

## 2022-08-25 ENCOUNTER — Other Ambulatory Visit (HOSPITAL_COMMUNITY): Payer: Self-pay

## 2022-08-25 ENCOUNTER — Other Ambulatory Visit: Payer: Self-pay | Admitting: *Deleted

## 2022-08-25 ENCOUNTER — Other Ambulatory Visit: Payer: Self-pay | Admitting: Hematology

## 2022-08-25 DIAGNOSIS — C61 Malignant neoplasm of prostate: Secondary | ICD-10-CM

## 2022-08-25 MED ORDER — NUBEQA 300 MG PO TABS
300.0000 mg | ORAL_TABLET | Freq: Two times a day (BID) | ORAL | 0 refills | Status: DC
Start: 2022-08-25 — End: 2022-09-18
  Filled 2022-08-25: qty 60, 30d supply, fill #0

## 2022-08-25 NOTE — Telephone Encounter (Signed)
Nubeqa refill approved.  Patient is tolerating and is to continue therapy. 

## 2022-08-31 ENCOUNTER — Other Ambulatory Visit (HOSPITAL_COMMUNITY): Payer: Self-pay

## 2022-09-03 ENCOUNTER — Other Ambulatory Visit: Payer: Self-pay

## 2022-09-04 ENCOUNTER — Other Ambulatory Visit: Payer: Self-pay

## 2022-09-04 ENCOUNTER — Encounter: Payer: Self-pay | Admitting: Surgical

## 2022-09-04 ENCOUNTER — Ambulatory Visit (INDEPENDENT_AMBULATORY_CARE_PROVIDER_SITE_OTHER): Payer: Medicare Other | Admitting: Surgical

## 2022-09-04 VITALS — Ht 73.0 in | Wt 178.0 lb

## 2022-09-04 DIAGNOSIS — M19011 Primary osteoarthritis, right shoulder: Secondary | ICD-10-CM | POA: Diagnosis not present

## 2022-09-04 DIAGNOSIS — M19012 Primary osteoarthritis, left shoulder: Secondary | ICD-10-CM

## 2022-09-05 ENCOUNTER — Encounter: Payer: Self-pay | Admitting: Surgical

## 2022-09-05 MED ORDER — METHYLPREDNISOLONE ACETATE 40 MG/ML IJ SUSP
40.0000 mg | INTRAMUSCULAR | Status: AC | PRN
Start: 2022-09-04 — End: 2022-09-04
  Administered 2022-09-04: 40 mg via INTRA_ARTICULAR

## 2022-09-05 MED ORDER — BUPIVACAINE HCL 0.25 % IJ SOLN
9.0000 mL | INTRAMUSCULAR | Status: AC | PRN
Start: 2022-09-04 — End: 2022-09-04
  Administered 2022-09-04: 9 mL via INTRA_ARTICULAR

## 2022-09-05 NOTE — Progress Notes (Signed)
   Procedure Note  Patient: Derrick Long             Date of Birth: [birth year]           MRN: [id number]             Visit Date: 09/04/2022  Procedures: Visit Diagnoses:  1. Primary osteoarthritis, right shoulder   2. Primary osteoarthritis, left shoulder     Large Joint Inj: bilateral glenohumeral on 09/04/2022 11:27 AM Indications: diagnostic evaluation and pain Details: 22 G 3.5 in needle, ultrasound-guided posterior approach  Arthrogram: No  Medications (Right): 40 mg methylPREDNISolone acetate 40 MG/ML; 9 mL bupivacaine 0.25 % Medications (Left): 40 mg methylPREDNISolone acetate 40 MG/ML; 9 mL bupivacaine 0.25 % Outcome: tolerated well, no immediate complications  Patient returns for bilateral glenohumeral injections.  Previously had injection back in February with good relief.  Would like to repeat this today in both shoulders.  Tolerated both procedures well and there is excellent flow the injection after intra-articular needle placement was confirmed with ultrasound.  No complication.  Follow-up as needed with next possible injection around 4 months from now. Procedure, treatment alternatives, risks and benefits explained, specific risks discussed. Consent was given by the patient. Immediately prior to procedure a time out was called to verify the correct patient, procedure, equipment, support staff and site/side marked as required. Patient was prepped and draped in the usual sterile fashion.

## 2022-09-08 ENCOUNTER — Other Ambulatory Visit: Payer: Self-pay | Admitting: Hematology

## 2022-09-10 ENCOUNTER — Inpatient Hospital Stay: Payer: Medicare Other | Attending: Hematology

## 2022-09-10 ENCOUNTER — Ambulatory Visit: Payer: Medicare Other | Admitting: Hematology

## 2022-09-10 DIAGNOSIS — C61 Malignant neoplasm of prostate: Secondary | ICD-10-CM | POA: Insufficient documentation

## 2022-09-10 LAB — CBC WITH DIFFERENTIAL/PLATELET
Abs Immature Granulocytes: 0.01 10*3/uL (ref 0.00–0.07)
Basophils Absolute: 0 10*3/uL (ref 0.0–0.1)
Basophils Relative: 0 %
Eosinophils Absolute: 0.1 10*3/uL (ref 0.0–0.5)
Eosinophils Relative: 3 %
HCT: 32.6 % — ABNORMAL LOW (ref 39.0–52.0)
Hemoglobin: 10.6 g/dL — ABNORMAL LOW (ref 13.0–17.0)
Immature Granulocytes: 0 %
Lymphocytes Relative: 42 %
Lymphs Abs: 1.7 10*3/uL (ref 0.7–4.0)
MCH: 32.9 pg (ref 26.0–34.0)
MCHC: 32.5 g/dL (ref 30.0–36.0)
MCV: 101.2 fL — ABNORMAL HIGH (ref 80.0–100.0)
Monocytes Absolute: 0.5 10*3/uL (ref 0.1–1.0)
Monocytes Relative: 11 %
Neutro Abs: 1.8 10*3/uL (ref 1.7–7.7)
Neutrophils Relative %: 44 %
Platelets: 216 10*3/uL (ref 150–400)
RBC: 3.22 MIL/uL — ABNORMAL LOW (ref 4.22–5.81)
RDW: 12.9 % (ref 11.5–15.5)
WBC: 4.1 10*3/uL (ref 4.0–10.5)
nRBC: 0 % (ref 0.0–0.2)

## 2022-09-10 LAB — COMPREHENSIVE METABOLIC PANEL
ALT: 11 U/L (ref 0–44)
AST: 15 U/L (ref 15–41)
Albumin: 3.2 g/dL — ABNORMAL LOW (ref 3.5–5.0)
Alkaline Phosphatase: 57 U/L (ref 38–126)
Anion gap: 7 (ref 5–15)
BUN: 20 mg/dL (ref 8–23)
CO2: 24 mmol/L (ref 22–32)
Calcium: 8.7 mg/dL — ABNORMAL LOW (ref 8.9–10.3)
Chloride: 103 mmol/L (ref 98–111)
Creatinine, Ser: 1.29 mg/dL — ABNORMAL HIGH (ref 0.61–1.24)
GFR, Estimated: 51 mL/min — ABNORMAL LOW (ref >60)
Glucose, Bld: 94 mg/dL (ref 70–99)
Potassium: 3.4 mmol/L — ABNORMAL LOW (ref 3.5–5.1)
Sodium: 134 mmol/L — ABNORMAL LOW (ref 135–145)
Total Bilirubin: 1 mg/dL (ref 0.3–1.2)
Total Protein: 6.6 g/dL (ref 6.5–8.1)

## 2022-09-10 LAB — MAGNESIUM: Magnesium: 1.5 mg/dL — ABNORMAL LOW (ref 1.7–2.4)

## 2022-09-10 LAB — PSA: Prostatic Specific Antigen: 1.5 ng/mL (ref 0.00–4.00)

## 2022-09-17 ENCOUNTER — Inpatient Hospital Stay: Payer: Medicare Other | Attending: Hematology

## 2022-09-17 ENCOUNTER — Inpatient Hospital Stay (HOSPITAL_BASED_OUTPATIENT_CLINIC_OR_DEPARTMENT_OTHER): Payer: Medicare Other | Admitting: Hematology

## 2022-09-17 VITALS — BP 116/79 | HR 71 | Temp 98.6°F | Resp 16 | Wt 172.1 lb

## 2022-09-17 DIAGNOSIS — N189 Chronic kidney disease, unspecified: Secondary | ICD-10-CM | POA: Diagnosis not present

## 2022-09-17 DIAGNOSIS — C7971 Secondary malignant neoplasm of right adrenal gland: Secondary | ICD-10-CM | POA: Insufficient documentation

## 2022-09-17 DIAGNOSIS — D539 Nutritional anemia, unspecified: Secondary | ICD-10-CM

## 2022-09-17 DIAGNOSIS — Z5111 Encounter for antineoplastic chemotherapy: Secondary | ICD-10-CM | POA: Diagnosis not present

## 2022-09-17 DIAGNOSIS — M549 Dorsalgia, unspecified: Secondary | ICD-10-CM | POA: Insufficient documentation

## 2022-09-17 DIAGNOSIS — D631 Anemia in chronic kidney disease: Secondary | ICD-10-CM | POA: Insufficient documentation

## 2022-09-17 DIAGNOSIS — C61 Malignant neoplasm of prostate: Secondary | ICD-10-CM | POA: Diagnosis present

## 2022-09-17 MED ORDER — LEUPROLIDE ACETATE (6 MONTH) 45 MG ~~LOC~~ KIT
45.0000 mg | PACK | Freq: Once | SUBCUTANEOUS | Status: AC
  Administered 2022-09-17: 45 mg via SUBCUTANEOUS
  Filled 2022-09-17: qty 45

## 2022-09-17 NOTE — Patient Instructions (Addendum)
[facility]  Discharge Instructions  You were seen and examined today by Dr. Ellin Saba.  Dr. Ellin Saba discussed your most recent lab work which revealed that everything looks okay except your magnesium is low and your PSA has went up slightly since last visit from 1.17 to 1.50.   Follow-up as scheduled in 3 months.    Thank you for choosing [facility] to provide your oncology and hematology care.   To afford each patient quality time with our provider, please arrive at least 15 minutes before your scheduled appointment time. You may need to reschedule your appointment if you arrive late (10 or more minutes). Arriving late affects you and other patients whose appointments are after yours.  Also, if you miss three or more appointments without notifying the office, you may be dismissed from the clinic at the provider's discretion.    Again, thank you for choosing [facility].  Our hope is that these requests will decrease the amount of time that you wait before being seen by our physicians.   If you have a lab appointment with the Cancer Center - please note that after April 8th, all labs will be drawn in the cancer center.  You do not have to check in or register with the main entrance as you have in the past but will complete your check-in at the cancer center.            _____________________________________________________________  Should you have questions after your visit to [facility], please contact our office at [phone] and follow the prompts.  Our office hours are 8:00 a.m. to 4:30 p.m. Monday - Thursday and 8:00 a.m. to 2:30 p.m. Friday.  Please note that voicemails left after 4:00 p.m. may not be returned until the following business day.  We are closed weekends and all major holidays.  You do have access to a nurse 24-7, just call the main number to the clinic [phone] and do not press any  options, hold on the line and a nurse will answer the phone.    For prescription refill requests, have your pharmacy contact our office and allow 72 hours.    Masks are no longer required in the cancer centers. If you would like for your care team to wear a mask while they are taking care of you, please let them know. You may have one support person who is at least 87 years old accompany you for your appointments.

## 2022-09-17 NOTE — Progress Notes (Signed)
Patient has been assessed, vital signs and labs have been reviewed by Dr. Katragadda. ANC, Creatinine, LFTs, and Platelets are within treatment parameters per Dr. Katragadda. The patient is good to proceed with treatment at this time. Primary RN and pharmacy aware.  

## 2022-09-17 NOTE — Progress Notes (Signed)
Patient tolerated Eligard injection with no complaints voiced.  Site clean and dry with no bruising or swelling noted.  No complaints of pain.  Discharged with vital signs stable and no signs or symptoms of distress noted.

## 2022-09-17 NOTE — Progress Notes (Signed)
[facility] [address], [place] [address]    Clinic Day:  09/17/2022  Referring physician: Center, [facility]*  Patient Care Team: Center, [facility] as PCP - General (General Practice) Therese Sarah, RN as Oncology Nurse Navigator (Oncology) Doreatha Massed, MD as Medical Oncologist (Medical Oncology)   ASSESSMENT & PLAN:   Assessment: 1.  Metastatic prostate cancer to the right adrenal gland: -Presentation to the ER with worsening right-sided back pain, which has been on and off for the last couple of years. - Some decrease in appetite with 6 pound weight loss in the last 6 months.  No hematuria or other B symptoms. - CT renal study on 05/03/2020 showed new right adrenal mass with bilateral renal lesions.  Indeterminate L2 vertebral bodies sclerotic lesion.  Low-attenuation lesions in the kidneys measure up to 2.3 cm on the right and were similar to scan from July 2021. - CT CAP on 06/12/2020 did not show any evidence of nodal metastasis or metastatic disease in the chest.  Right adrenal mass measures 4.2 x 3.8 cm exerts mass-effect upon the posterior and right lateral wall of the IVC.  Right kidney masses measuring 2.3 x 2.3 cm and 1.9 x 1.7 cm suspicious for RCC.  Upper pole of the left kidney lesion 2.2 x 2.0 cm and medial cortex of the inferior pole measuring 2 x 2 cm and another interpolar left kidney lesion measuring 1.3 x 1.5 cm. - Bone scan on 06/13/2020 was negative. - Right adrenal biopsy consistent with prostatic adenocarcinoma - Degarelix on 07/16/2020, transition to Eligard 45 mg on 08/21/2020. - Abiraterone 100 mg daily/prednisone 5 mg daily started around 01/22/2021.,  Discontinued in April 2023.  Not a candidate for Erleada due to blood thinner. - Darolutamide 300 mg twice daily started on 06/19/2021, increased to 600 mg twice daily on 06/26/2021, cut back to 300 mg twice daily on 07/07/2021 due to decreased oral intake and tiredness.    2.  Social/family history: - He lives at home with his daughter.  He walks with a cane. - He mows lawn with a riding mower.  He has a garden and gross vegetables.  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  Quit smoking 25 years ago. - No family history of malignancies that he is aware of.    Plan: 1.  Metastatic prostate cancer to the right adrenal gland: - Last Eligard 45 mg on 02/26/2022. - He is reasonably functioning and is able to do all his day-to-day activities. - He is tolerating darolutamide reasonably well.  He could not tolerate full dose due to decreased eating and tiredness. - He also takes prednisone 5 mg daily. - Reviewed labs from 09/10/2022: Normal LFTs and mildly elevated creatinine.  CBC grossly normal.  PSA is elevated at 1.5, up from 1.17 on 06/10/2022. - His PSA has trended up last couple of times since January of this year. - We discussed various options.  However because of his advanced age and poor tolerance to full dose darolutamide, I am not making any changes at this time.  We will reevaluate him in 3 months.  Will check Guardant360 as well as germline mutation testing with labs at next visit.   2.  Right sided back pain: - Continue tramadol 50 mg as needed.  He requires it every other day.  3.  Macrocytic anemia: - Combination anemia from CKD and chronic inflammation/early MDS. - Hemoglobin is 10.6 and MCV 101.  Will check ferritin and iron panel at next visit.  Orders Placed This Encounter  Procedures   PSA    Standing Status:   Future    Standing Expiration Date:   09/17/2023   Ferritin    Standing Status:   Future    Standing Expiration Date:   09/17/2023    Order Specific Question:   Release to patient    Answer:   Immediate   Iron and TIBC    Standing Status:   Future    Standing Expiration Date:   09/17/2023    Order Specific Question:   Release to patient    Answer:   Immediate   Comprehensive metabolic panel    Standing Status:   Future     Standing Expiration Date:   09/17/2023    Order Specific Question:   Release to patient    Answer:   Immediate   CBC    Standing Status:   Future    Standing Expiration Date:   09/17/2023      Alben Deeds Teague,acting as a scribe for Doreatha Massed, MD.,have documented all relevant documentation on the behalf of Doreatha Massed, MD,as directed by  Doreatha Massed, MD while in the presence of Doreatha Massed, MD.  I, Doreatha Massed MD, have reviewed the above documentation for accuracy and completeness, and I agree with the above.    Doreatha Massed, MD   8/1/20246:49 PM  CHIEF COMPLAINT:   Diagnosis: metastatic prostate cancer to the right adrenal gland    Cancer Staging  Prostate cancer [facility]) Staging form: Prostate, AJCC 8th Edition - Clinical stage from 07/10/2020: Stage IVB (cTX, cNX, pM1c, PSA: 10.2) - Unsigned    Prior Therapy: Abiraterone 01/22/21 - 05/2021  Current Therapy:  Lupron every 6 months, darolutamide   HISTORY OF PRESENT ILLNESS:   Oncology History   No history exists.     INTERVAL HISTORY:   Adi is a 89 y.o. male presenting to clinic today for follow up of metastatic prostate cancer to the right adrenal gland. He was last seen by me on 06/17/22.  Today, he states that he is doing well overall. His appetite level is at 70%. His energy level is at 85%.  PAST MEDICAL HISTORY:   Past Medical History: Past Medical History:  Diagnosis Date   Acid reflux    Acute colitis 09/16/2019   AKI (acute kidney injury) (HCC) 07/31/2012   Anemia    BENIGN PROSTATIC HYPERTROPHY, WITH OBSTRUCTION 12/18/2005   Qualifier: Diagnosis of  By: Magdalene River     Chest pain 07/31/2012   Colitis 09/16/2019   Colon cancer (HCC)    per patient, colon cancer more than 10 years ago s/p right hemicolectomy   Elevated bilirubin    Elevated LFTs    Ex-cigarette smoker    Gout    Hyperlipemia    Hypertension    Hyponatremia 08/01/2021    Intractable abdominal pain 02/03/2021   LATERAL MENISCUS TEAR, RIGHT 04/19/2006   Qualifier: Diagnosis of  By: Erby Pian MD, Cornelius     Leukocytosis, unspecified 07/31/2012   Paroxysmal atrial fibrillation (HCC) 2014   Presence of permanent cardiac pacemaker    Prostate cancer (HCC) 08/08/2013   SEEDS 20 YEARS AGO   Prostate disease    RENAL CALCULUS 12/18/2005   Qualifier: Diagnosis of  By: Magdalene River     RUQ pain    UTI (urinary tract infection) 03/26/2021    Surgical History: Past Surgical History:  Procedure Laterality Date  BIOPSY  02/05/2021   Procedure: BIOPSY;  Surgeon: Dolores Frame, MD;  Location: [facility];  Service: Gastroenterology;;   BIOPSY  08/02/2021   Procedure: BIOPSY;  Surgeon: Corbin Ade, MD;  Location: [facility];  Service: Endoscopy;;   COLON SURGERY     Right hemicolectomy in the setting of colon cancer, per patient   CYSTOSCOPY WITH INSERTION OF UROLIFT N/A 05/19/2021   Procedure: CYSTOSCOPY WITH INSERTION OF UROLIFT;  Surgeon: Malen Gauze, MD;  Location: [facility];  Service: Urology;  Laterality: N/A;   ENTEROSCOPY N/A 11/21/2021   Procedure: PUSH ENTEROSCOPY;  Surgeon: Dolores Frame, MD;  [facility]: [facility];  Service: Gastroenterology;  Laterality: N/A;   ESOPHAGOGASTRODUODENOSCOPY (EGD) WITH PROPOFOL N/A 02/05/2021   Procedure: ESOPHAGOGASTRODUODENOSCOPY (EGD) WITH PROPOFOL;  Surgeon: Dolores Frame, MD;  [facility];  Service: Gastroenterology;  Laterality: N/A;   ESOPHAGOGASTRODUODENOSCOPY (EGD) WITH PROPOFOL N/A 08/02/2021   Procedure: ESOPHAGOGASTRODUODENOSCOPY (EGD) WITH PROPOFOL;  Surgeon: Corbin Ade, MD;  [facility];  Service: Endoscopy;  Laterality: N/A;   ESOPHAGOGASTRODUODENOSCOPY (EGD) WITH PROPOFOL N/A 11/21/2021   Procedure: ESOPHAGOGASTRODUODENOSCOPY (EGD) WITH PROPOFOL;  Surgeon: Dolores Frame, MD;  [facility]: [facility];  Service:  Gastroenterology;  Laterality: N/A;  1200  ASA 3   HOT HEMOSTASIS  08/02/2021   Procedure: HOT HEMOSTASIS (ARGON PLASMA COAGULATION/BICAP);  Surgeon: Corbin Ade, MD;  [facility]: [facility];  Service: Endoscopy;;   HOT HEMOSTASIS  11/21/2021   Procedure: HOT HEMOSTASIS (ARGON PLASMA COAGULATION/BICAP);  Surgeon: Marguerita Merles, Reuel Boom, MD;  [facility]: [facility];  Service: Gastroenterology;;   LEFT HEART CATHETERIZATION WITH CORONARY ANGIOGRAM N/A 07/21/2012   Procedure: LEFT HEART CATHETERIZATION WITH CORONARY ANGIOGRAM;  Surgeon: Lennette Bihari, MD;  Location: [facility] CATH LAB;  Service: Cardiovascular;  Laterality: N/A;   PACEMAKER INSERTION     PERMANENT PACEMAKER INSERTION N/A 07/22/2012   Procedure: PERMANENT PACEMAKER INSERTION;  Surgeon: Thurmon Fair, MD;  Location: [facility] CATH LAB;  Service: Cardiovascular;  Laterality: N/A;   PROSTATE SURGERY      Social History: Social History   Socioeconomic History   Marital status: Married    Spouse name: Not on file   Number of children: Not on file   Years of education: Not on file   Highest education level: Not on file  Occupational History   Not on file  Tobacco Use   Smoking status: Former    Current packs/day: 0.00    Types: Cigarettes    Quit date: 07/20/2002    Years since quitting: 20.1    Passive exposure: Past   Smokeless tobacco: Former    Types: Engineer, drilling   Vaping status: Never Used  Substance and Sexual Activity   Alcohol use: No   Drug use: No   Sexual activity: Not on file  Other Topics Concern   Not on file  Social History Narrative   Not on file   Social Determinants of Health   Financial Resource Strain: Low Risk  (05/16/2020)   Overall Financial Resource Strain (CARDIA)    Difficulty of Paying Living Expenses: Not hard at all  Food Insecurity: No Food Insecurity (05/16/2020)   Hunger Vital Sign    Worried About Running Out of Food in the Last Year: Never true    Ran Out of Food in the Last  Year: Never true  Transportation Needs: No Transportation Needs (05/16/2020)   PRAPARE - Transportation  Lack of Transportation (Medical): No    Lack of Transportation (Non-Medical): No  Physical Activity: Insufficiently Active (05/16/2020)   Exercise Vital Sign    Days of Exercise per Week: 3 days    Minutes of Exercise per Session: 30 min  Stress: No Stress Concern Present (05/16/2020)   [employer] of Occupational Health - Occupational Stress Questionnaire    Feeling of Stress : Not at all  Social Connections: Moderately Integrated (05/16/2020)   Social Connection and Isolation Panel [NHANES]    Frequency of Communication with Friends and Family: More than three times a week    Frequency of Social Gatherings with Friends and Family: More than three times a week    Attends Religious Services: More than 4 times per year    Active Member of Clubs or Organizations: No    Attends Banker Meetings: 1 to 4 times per year    Marital Status: Widowed  Intimate Partner Violence: Not At Risk (05/16/2020)   Humiliation, Afraid, Rape, and Kick questionnaire    Fear of Current or Ex-Partner: No    Emotionally Abused: No    Physically Abused: No    Sexually Abused: No    Family History: Family History  Problem Relation Age of Onset   Cancer Neg Hx     Current Medications:  Current Outpatient Medications:    acetaminophen (TYLENOL) 650 MG CR tablet, Take 1,300 mg by mouth every 8 (eight) hours as needed for pain., Disp: , Rfl:    amLODipine (NORVASC) 10 MG tablet, Take 1 tablet by mouth daily., Disp: , Rfl:    carvedilol (COREG) 3.125 MG tablet, Take 3.125 mg by mouth 2 (two) times daily with a meal., Disp: , Rfl:    darolutamide (NUBEQA) 300 MG tablet, Take 1 tablet (300 mg total) by mouth 2 (two) times daily with a meal., Disp: 60 tablet, Rfl: 0   diclofenac sodium (VOLTAREN) 1 % GEL, Apply 2 g topically 4 (four) times daily., Disp: 100 g, Rfl: 0   Ensure (ENSURE), Take  237 mLs by mouth daily as needed (nutrition). Vanilla or chocolate, Disp: , Rfl:    ferrous sulfate 324 MG TBEC, Take 324 mg by mouth. Takes one tablet every other day, Disp: , Rfl:    furosemide (LASIX) 20 MG tablet, Take 20 mg by mouth. One half tablet daily, Disp: , Rfl:    lisinopril (ZESTRIL) 10 MG tablet, Take 1 tablet by mouth daily., Disp: , Rfl:    pantoprazole (PROTONIX) 40 MG tablet, Take 1 tablet (40 mg total) by mouth daily., Disp: 90 tablet, Rfl: 3   predniSONE (DELTASONE) 5 MG tablet, TAKE ONE TABLET BY MOUTH ONCE DAILY WITH BREAKFAST., Disp: 30 tablet, Rfl: 0   tamsulosin (FLOMAX) 0.4 MG CAPS capsule, Take 1 capsule (0.4 mg total) by mouth 2 (two) times daily., Disp: 60 capsule, Rfl: 11   traMADol (ULTRAM) 50 MG tablet, Take 1 tablet (50 mg total) by mouth at bedtime as needed., Disp: 30 tablet, Rfl: 0 No current facility-administered medications for this visit.  Facility-Administered Medications Ordered in Other Visits:    octreotide (SANDOSTATIN LAR) 20 MG IM injection, , , ,    Allergies: No Known Allergies  REVIEW OF SYSTEMS:   Review of Systems  Constitutional:  Negative for chills, fatigue and fever.  HENT:   Negative for lump/mass, mouth sores, nosebleeds, sore throat and trouble swallowing.   Eyes:  Negative for eye problems.  Respiratory:  Negative for cough and shortness  of breath.   Cardiovascular:  Negative for chest pain, leg swelling and palpitations.  Gastrointestinal:  Negative for abdominal pain, constipation, diarrhea, nausea and vomiting.  Genitourinary:  Negative for bladder incontinence, difficulty urinating, dysuria, frequency, hematuria and nocturia.   Musculoskeletal:  Negative for arthralgias, back pain, flank pain, myalgias and neck pain.  Skin:  Negative for itching and rash.  Neurological:  Negative for dizziness, headaches and numbness.  Hematological:  Does not bruise/bleed easily.  Psychiatric/Behavioral:  Negative for depression, sleep  disturbance and suicidal ideas. The patient is not nervous/anxious.   All other systems reviewed and are negative.    VITALS:   Blood pressure 116/79, pulse 71, temperature 98.6 F (37 C), temperature source Tympanic, resp. rate 16, weight 172 lb 1.6 oz (78.1 kg), SpO2 98%.  Wt Readings from Last 3 Encounters:  09/17/22 172 lb 1.6 oz (78.1 kg)  09/04/22 178 lb (80.7 kg)  07/27/22 178 lb 9.6 oz (81 kg)    Body mass index is 22.71 kg/m.  Performance status (ECOG): 1 - Symptomatic but completely ambulatory  PHYSICAL EXAM:   Physical Exam Vitals and nursing note reviewed. Exam conducted with a chaperone present.  Constitutional:      Appearance: Normal appearance.  Cardiovascular:     Rate and Rhythm: Normal rate and regular rhythm.     Pulses: Normal pulses.     Heart sounds: Normal heart sounds.  Pulmonary:     Effort: Pulmonary effort is normal.     Breath sounds: Normal breath sounds.  Abdominal:     Palpations: Abdomen is soft. There is no hepatomegaly, splenomegaly or mass.     Tenderness: There is no abdominal tenderness.  Musculoskeletal:     Right lower leg: No edema.     Left lower leg: No edema.  Lymphadenopathy:     Cervical: No cervical adenopathy.     Right cervical: No superficial, deep or posterior cervical adenopathy.    Left cervical: No superficial, deep or posterior cervical adenopathy.     Upper Body:     Right upper body: No supraclavicular or axillary adenopathy.     Left upper body: No supraclavicular or axillary adenopathy.  Neurological:     General: No focal deficit present.     Mental Status: He is alert and oriented to person, place, and time.  Psychiatric:        Mood and Affect: Mood normal.        Behavior: Behavior normal.     LABS:      Latest Ref Rng & Units 09/10/2022   10:34 AM 06/10/2022    9:50 AM 03/05/2022    1:27 PM  CBC  WBC 4.0 - 10.5 K/uL 4.1  4.2  4.8   Hemoglobin 13.0 - 17.0 g/dL 16.1  09.6  04.5   Hematocrit 39.0  - 52.0 % 32.6  33.9  33.6   Platelets 150 - 400 K/uL 216  223  239       Latest Ref Rng & Units 09/10/2022   10:34 AM 06/10/2022    9:50 AM 03/05/2022    1:27 PM  CMP  Glucose 70 - 99 mg/dL 94  96  409   BUN 8 - 23 mg/dL 20  21  17    Creatinine 0.61 - 1.24 mg/dL 8.11  9.14  7.82   Sodium 135 - 145 mmol/L 134  135  133   Potassium 3.5 - 5.1 mmol/L 3.4  3.7  3.6   Chloride 98 -  111 mmol/L 103  104  100   CO2 22 - 32 mmol/L 24  23  25    Calcium 8.9 - 10.3 mg/dL 8.7  9.2  8.9   Total Protein 6.5 - 8.1 g/dL 6.6  6.9  6.9   Total Bilirubin 0.3 - 1.2 mg/dL 1.0  0.9  0.7   Alkaline Phos 38 - 126 U/L 57  55  62   AST 15 - 41 U/L 15  19  21    ALT 0 - 44 U/L 11  14  5       Lab Results  Component Value Date   CEA 1.2 12/23/2007   /  CEA  Date Value Ref Range Status  12/23/2007 1.2 0.0 - 5.0 ng/mL Final   No results found for: "PSA1" No results found for: "EXB284" No results found for: "CAN125"  No results found for: "TOTALPROTELP", "ALBUMINELP", "A1GS", "A2GS", "BETS", "BETA2SER", "GAMS", "MSPIKE", "SPEI" Lab Results  Component Value Date   TIBC 229 (L) 04/28/2022   TIBC 217 (L) 01/20/2022   TIBC 292 12/11/2020   FERRITIN 318 04/28/2022   FERRITIN 312 01/20/2022   FERRITIN 224 12/11/2020   IRONPCTSAT 50 04/28/2022   IRONPCTSAT 62 (H) 01/20/2022   IRONPCTSAT 41 (H) 12/11/2020   Lab Results  Component Value Date   LDH 177 10/24/2020   LDH 192 09/10/2020   LDH 198 (H) 08/06/2020     STUDIES:   US Guided Needle Placement - No Linked Charges  Result Date: 09/05/2022 Ultrasound imaging demonstrates needle placement into the left and then needle placement into the right glenohumeral joints.  Extravasation of fluid is noted into either joint without complication.

## 2022-09-17 NOTE — Patient Instructions (Signed)
[facility]  Discharge Instructions: Thank you for choosing [facility] to provide your oncology and hematology care.  If you have a lab appointment with the Cancer Center - please note that after April 8th, 2024, all labs will be drawn in the cancer center.  You do not have to check in or register with the main entrance as you have in the past but will complete your check-in in the cancer center.  Wear comfortable clothing and clothing appropriate for easy access to any Portacath or PICC line.   We strive to give you quality time with your provider. You may need to reschedule your appointment if you arrive late (15 or more minutes).  Arriving late affects you and other patients whose appointments are after yours.  Also, if you miss three or more appointments without notifying the office, you may be dismissed from the clinic at the provider's discretion.      For prescription refill requests, have your pharmacy contact our office and allow 72 hours for refills to be completed.    Today you received the following chemotherapy and/or immunotherapy agents Eligard.  Leuprolide Suspension for Injection (Prostate Cancer) What is this medication? LEUPROLIDE (loo PROE lide) reduces the symptoms of prostate cancer. It works by decreasing levels of the hormone testosterone in the body. This prevents prostate cancer cells from spreading or growing. This medicine may be used for other purposes; ask your health care provider or pharmacist if you have questions. COMMON BRAND NAME(S): Eligard, Lupron Depot, Lutrate Depot What should I tell my care team before I take this medication? They need to know if you have any of these conditions: Diabetes Heart disease Heart failure High or low levels of electrolytes, such as magnesium, potassium, or sodium in your blood Irregular heartbeat or rhythm Seizures An unusual or allergic reaction to leuprolide, other medications, foods,  dyes, or preservatives Pregnant or trying to get pregnant Breast-feeding How should I use this medication? This medication is injected under the skin or into a muscle. It is given by your care team in a hospital or clinic setting. Talk to your care team about the use of this medication in children. Special care may be needed. Overdosage: If you think you have taken too much of this medicine contact a poison control center or emergency room at once. NOTE: This medicine is only for you. Do not share this medicine with others. What if I miss a dose? Keep appointments for follow-up doses. It is important not to miss your dose. Call your care team if you are unable to keep an appointment. What may interact with this medication? Do not take this medication with any of the following: Cisapride Dronedarone Ketoconazole Levoketoconazole Pimozide Thioridazine This medication may also interact with the following: Other medications that cause heart rhythm changes This list may not describe all possible interactions. Give your health care provider a list of all the medicines, herbs, non-prescription drugs, or dietary supplements you use. Also tell them if you smoke, drink alcohol, or use illegal drugs. Some items may interact with your medicine. What should I watch for while using this medication? Visit your care team for regular checks on your progress. Tell your care team if your symptoms do not start to get better or if they get worse. This medication may increase blood sugar. The risk may be higher in patients who already have diabetes. Ask your care team what you can do to lower the risk of  diabetes while taking this medication. This medication may cause infertility. Talk to your care team if you are concerned about your fertility. Heart attacks and strokes have been reported with the use of this medication. Get emergency help if you develop signs or symptoms of a heart attack or stroke. Talk to  your care team about the risks and benefits of this medication. What side effects may I notice from receiving this medication? Side effects that you should report to your care team as soon as possible: Allergic reactions--skin rash, itching, hives, swelling of the face, lips, tongue, or throat Heart attack--pain or tightness in the chest, shoulders, arms, or jaw, nausea, shortness of breath, cold or clammy skin, feeling faint or lightheaded Heart rhythm changes--fast or irregular heartbeat, dizziness, feeling faint or lightheaded, chest pain, trouble breathing High blood sugar (hyperglycemia)--increased thirst or amount of urine, unusual weakness or fatigue, blurry vision New or worsening seizures Redness, blistering, peeling, or loosening of the skin, including inside the mouth Stroke--sudden numbness or weakness of the face, arm, or leg, trouble speaking, confusion, trouble walking, loss of balance or coordination, dizziness, severe headache, change in vision Swelling and pain of the tumor site or lymph nodes Side effects that usually do not require medical attention (report these to your care team if they continue or are bothersome): Change in sex drive or performance Hot flashes Joint pain Pain, redness, or irritation at injection site Swelling of the ankles, hands, or feet Unusual weakness or fatigue This list may not describe all possible side effects. Call your doctor for medical advice about side effects. You may report side effects to FDA at 1-800-FDA-1088. Where should I keep my medication? This medication is given in a hospital or clinic. It will not be stored at home. NOTE: This sheet is a summary. It may not cover all possible information. If you have questions about this medicine, talk to your doctor, pharmacist, or health care provider.  2024 Elsevier/Gold Standard (2022-06-05 00:00:00)        To help prevent nausea and vomiting after your treatment, we encourage you to take  your nausea medication as directed.  BELOW ARE SYMPTOMS THAT SHOULD BE REPORTED IMMEDIATELY: *FEVER GREATER THAN 100.4 F (38 C) OR HIGHER *CHILLS OR SWEATING *NAUSEA AND VOMITING THAT IS NOT CONTROLLED WITH YOUR NAUSEA MEDICATION *UNUSUAL SHORTNESS OF BREATH *UNUSUAL BRUISING OR BLEEDING *URINARY PROBLEMS (pain or burning when urinating, or frequent urination) *BOWEL PROBLEMS (unusual diarrhea, constipation, pain near the anus) TENDERNESS IN MOUTH AND THROAT WITH OR WITHOUT PRESENCE OF ULCERS (sore throat, sores in mouth, or a toothache) UNUSUAL RASH, SWELLING OR PAIN  UNUSUAL VAGINAL DISCHARGE OR ITCHING   Items with * indicate a potential emergency and should be followed up as soon as possible or go to the Emergency Department if any problems should occur.  Please show the CHEMOTHERAPY ALERT CARD or IMMUNOTHERAPY ALERT CARD at check-in to the Emergency Department and triage nurse.  Should you have questions after your visit or need to cancel or reschedule your appointment, please contact [facility] [phone]  and follow the prompts.  Office hours are 8:00 a.m. to 4:30 p.m. Monday - Friday. Please note that voicemails left after 4:00 p.m. may not be returned until the following business day.  We are closed weekends and major holidays. You have access to a nurse at all times for urgent questions. Please call the main number to the clinic [phone] and follow the prompts.  For any non-urgent questions,  you may also contact your provider using MyChart. We now offer e-Visits for anyone 52 and older to request care online for non-urgent symptoms. For details visit [email or web address].   Also download the MyChart app! Go to the app store, search "MyChart", open the app, select [place], and log in with your MyChart username and password.

## 2022-09-18 ENCOUNTER — Other Ambulatory Visit: Payer: Self-pay | Admitting: Hematology

## 2022-09-18 ENCOUNTER — Other Ambulatory Visit (HOSPITAL_COMMUNITY): Payer: Self-pay

## 2022-09-18 DIAGNOSIS — C61 Malignant neoplasm of prostate: Secondary | ICD-10-CM

## 2022-09-18 MED ORDER — NUBEQA 300 MG PO TABS
300.0000 mg | ORAL_TABLET | Freq: Two times a day (BID) | ORAL | 3 refills | Status: DC
  Filled 2022-09-20: qty 60, 30d supply, fill #0
  Filled 2022-10-15: qty 60, 30d supply, fill #1
  Filled 2022-11-17: qty 60, 30d supply, fill #2
  Filled 2022-12-10: qty 60, 30d supply, fill #3

## 2022-09-20 ENCOUNTER — Other Ambulatory Visit: Payer: Self-pay

## 2022-09-21 ENCOUNTER — Other Ambulatory Visit (HOSPITAL_COMMUNITY): Payer: Self-pay

## 2022-09-25 ENCOUNTER — Other Ambulatory Visit (HOSPITAL_COMMUNITY): Payer: Self-pay

## 2022-10-07 DIAGNOSIS — Z87442 Personal history of urinary calculi: Secondary | ICD-10-CM | POA: Insufficient documentation

## 2022-10-07 DIAGNOSIS — R399 Unspecified symptoms and signs involving the genitourinary system: Secondary | ICD-10-CM | POA: Insufficient documentation

## 2022-10-07 NOTE — Progress Notes (Unsigned)
Name: Derrick Long DOB: Jun 25, 1926 MRN: [id number]  History of Present Illness: Derrick Long is a 89 y.o. male who presents today for return visit at [facility]. - GU history: 1. Prostate cancer; metastatic to the right adrenal gland.  - Managed by oncology (Dr. Ellin Saba). - Degarelix on 07/16/2020, transition to Eligard 45 mg on 08/21/2020. - Abiraterone 100 mg daily/prednisone 5 mg daily started around 01/22/2021., Discontinued in April 2023.  2. Bladder outlet obstruction secondary to prostatomegaly; LUTS (urinary retention). - ***Taking Flomax.  - Underwent Urolift on 05/19/2021 by Dr. Ronne Binning.  3. Kidney stones.  At last visit with Dr. Ronne Binning on 08/04/2022: - Seen for dysuria. Urine culture negative. - PSA normal (1.50).  - The plan was to continue Flomax 0.4mg  daily and double voiding.   Since last visit: ***  Today: He reports ***  He reports *** urinary stream. He {Actions; denies-reports:120008} urinary hesitancy, urgency, frequency, dysuria, gross hematuria, straining to void, or sensations of incomplete emptying.   Fall Screening: Do you usually have a device to assist in your mobility? {yes/no:20286} ***cane / ***walker / ***wheelchair   Medications: Current Outpatient Medications  Medication Sig Dispense Refill   acetaminophen (TYLENOL) 650 MG CR tablet Take 1,300 mg by mouth every 8 (eight) hours as needed for pain.     amLODipine (NORVASC) 10 MG tablet Take 1 tablet by mouth daily.     carvedilol (COREG) 3.125 MG tablet Take 3.125 mg by mouth 2 (two) times daily with a meal.     darolutamide (NUBEQA) 300 MG tablet Take 1 tablet (300 mg total) by mouth 2 (two) times daily with a meal. 60 tablet 3   diclofenac sodium (VOLTAREN) 1 % GEL Apply 2 g topically 4 (four) times daily. 100 g 0   Ensure (ENSURE) Take 237 mLs by mouth daily as needed (nutrition). Vanilla or chocolate     ferrous sulfate 324 MG TBEC Take 324 mg by mouth. Takes one  tablet every other day     furosemide (LASIX) 20 MG tablet Take 20 mg by mouth. One half tablet daily     lisinopril (ZESTRIL) 10 MG tablet Take 1 tablet by mouth daily.     pantoprazole (PROTONIX) 40 MG tablet Take 1 tablet (40 mg total) by mouth daily. 90 tablet 3   predniSONE (DELTASONE) 5 MG tablet TAKE ONE TABLET BY MOUTH ONCE DAILY WITH BREAKFAST. 30 tablet 0   tamsulosin (FLOMAX) 0.4 MG CAPS capsule Take 1 capsule (0.4 mg total) by mouth 2 (two) times daily. 60 capsule 11   traMADol (ULTRAM) 50 MG tablet Take 1 tablet (50 mg total) by mouth at bedtime as needed. 30 tablet 0   No current facility-administered medications for this visit.   Facility-Administered Medications Ordered in Other Visits  Medication Dose Route Frequency Provider Last Rate Last Admin   octreotide (SANDOSTATIN LAR) 20 MG IM injection             Allergies: No Known Allergies  Past Medical History:  Diagnosis Date   Acid reflux    Acute colitis 09/16/2019   AKI (acute kidney injury) (HCC) 07/31/2012   Anemia    BENIGN PROSTATIC HYPERTROPHY, WITH OBSTRUCTION 12/18/2005   Qualifier: Diagnosis of  By: Magdalene River     Chest pain 07/31/2012   Colitis 09/16/2019   Colon cancer (HCC)    per patient, colon cancer more than 10 years ago s/p right hemicolectomy   Elevated bilirubin    Elevated  LFTs    Ex-cigarette smoker    Gout    Hyperlipemia    Hypertension    Hyponatremia 08/01/2021   Intractable abdominal pain 02/03/2021   LATERAL MENISCUS TEAR, RIGHT 04/19/2006   Qualifier: Diagnosis of  By: Erby Pian MD, Cornelius     Leukocytosis, unspecified 07/31/2012   Paroxysmal atrial fibrillation (HCC) 2014   Presence of permanent cardiac pacemaker    Prostate cancer (HCC) 08/08/2013   SEEDS 20 YEARS AGO   Prostate disease    RENAL CALCULUS 12/18/2005   Qualifier: Diagnosis of  By: Magdalene River     RUQ pain    UTI (urinary tract infection) 03/26/2021   Past Surgical History:  Procedure  Laterality Date   BIOPSY  02/05/2021   Procedure: BIOPSY;  Surgeon: Dolores Frame, MD;  [facility]: [facility];  Service: Gastroenterology;;   BIOPSY  08/02/2021   Procedure: BIOPSY;  Surgeon: Corbin Ade, MD;  [facility]: [facility];  Service: Endoscopy;;   COLON SURGERY     Right hemicolectomy in the setting of colon cancer, per patient   CYSTOSCOPY WITH INSERTION OF UROLIFT N/A 05/19/2021   Procedure: CYSTOSCOPY WITH INSERTION OF UROLIFT;  Surgeon: Malen Gauze, MD;  Location: [facility];  Service: Urology;  Laterality: N/A;   ENTEROSCOPY N/A 11/21/2021   Procedure: PUSH ENTEROSCOPY;  Surgeon: Dolores Frame, MD;  [facility]: [facility];  Service: Gastroenterology;  Laterality: N/A;   ESOPHAGOGASTRODUODENOSCOPY (EGD) WITH PROPOFOL N/A 02/05/2021   Procedure: ESOPHAGOGASTRODUODENOSCOPY (EGD) WITH PROPOFOL;  Surgeon: Dolores Frame, MD;  [facility]: [facility];  Service: Gastroenterology;  Laterality: N/A;   ESOPHAGOGASTRODUODENOSCOPY (EGD) WITH PROPOFOL N/A 08/02/2021   Procedure: ESOPHAGOGASTRODUODENOSCOPY (EGD) WITH PROPOFOL;  Surgeon: Corbin Ade, MD;  [facility];  Service: Endoscopy;  Laterality: N/A;   ESOPHAGOGASTRODUODENOSCOPY (EGD) WITH PROPOFOL N/A 11/21/2021   Procedure: ESOPHAGOGASTRODUODENOSCOPY (EGD) WITH PROPOFOL;  Surgeon: Dolores Frame, MD;  [facility]: [facility];  Service: Gastroenterology;  Laterality: N/A;  1200  ASA 3   HOT HEMOSTASIS  08/02/2021   Procedure: HOT HEMOSTASIS (ARGON PLASMA COAGULATION/BICAP);  Surgeon: Corbin Ade, MD;  [facility]: [facility];  Service: Endoscopy;;   HOT HEMOSTASIS  11/21/2021   Procedure: HOT HEMOSTASIS (ARGON PLASMA COAGULATION/BICAP);  Surgeon: Marguerita Merles, Reuel Boom, MD;  [facility]: [facility];  Service: Gastroenterology;;   LEFT HEART CATHETERIZATION WITH CORONARY ANGIOGRAM N/A 07/21/2012   Procedure: LEFT HEART CATHETERIZATION WITH CORONARY ANGIOGRAM;   Surgeon: Lennette Bihari, MD;  Location: [facility];  Service: Cardiovascular;  Laterality: N/A;   PACEMAKER INSERTION     PERMANENT PACEMAKER INSERTION N/A 07/22/2012   Procedure: PERMANENT PACEMAKER INSERTION;  Surgeon: Thurmon Fair, MD;  Location: [facility] CATH [facility];  Service: Cardiovascular;  Laterality: N/A;   PROSTATE SURGERY     Family History  Problem Relation Age of Onset   Cancer Neg Hx    Social History   Socioeconomic History   Marital status: Married    Spouse name: Not on file   Number of children: Not on file   Years of education: Not on file   Highest education level: Not on file  Occupational History   Not on file  Tobacco Use   Smoking status: Former    Current packs/day: 0.00    Types: Cigarettes    Quit date: 07/20/2002    Years since quitting: 20.2    Passive exposure: Past   Smokeless tobacco: Former    Types: Sports administrator  Vaping Use   Vaping status: Never Used  Substance and Sexual Activity   Alcohol use: No   Drug use: No   Sexual activity: Not on file  Other Topics Concern   Not on file  Social History Narrative   Not on file   Social Determinants of Health   Financial Resource Strain: Low Risk  (05/16/2020)   Overall Financial Resource Strain (CARDIA)    Difficulty of Paying Living Expenses: Not hard at all  Food Insecurity: No Food Insecurity (05/16/2020)   Hunger Vital Sign    Worried About Running Out of Food in the Last Year: Never true    Ran Out of Food in the Last Year: Never true  Transportation Needs: No Transportation Needs (05/16/2020)   PRAPARE - Administrator, Civil Service (Medical): No    Lack of Transportation (Non-Medical): No  Physical Activity: Insufficiently Active (05/16/2020)   Exercise Vital Sign    Days of Exercise per Week: 3 days    Minutes of Exercise per Session: 30 min  Stress: No Stress Concern Present (05/16/2020)   [employer] of Occupational Health - Occupational Stress Questionnaire    Feeling of  Stress : Not at all  Social Connections: Moderately Integrated (05/16/2020)   Social Connection and Isolation Panel [NHANES]    Frequency of Communication with Friends and Family: More than three times a week    Frequency of Social Gatherings with Friends and Family: More than three times a week    Attends Religious Services: More than 4 times per year    Active Member of Clubs or Organizations: No    Attends Banker Meetings: 1 to 4 times per year    Marital Status: Widowed  Intimate Partner Violence: Not At Risk (05/16/2020)   Humiliation, Afraid, Rape, and Kick questionnaire    Fear of Current or Ex-Partner: No    Emotionally Abused: No    Physically Abused: No    Sexually Abused: No    Review of Systems Constitutional: Patient ***denies any unintentional weight loss or change in strength lntegumentary: Patient ***denies any rashes or pruritus Eyes: Patient denies ***dry eyes ENT: Patient ***denies dry mouth Cardiovascular: Patient ***denies chest pain or syncope Respiratory: Patient ***denies shortness of breath Gastrointestinal: Patient ***denies nausea, vomiting, constipation, or diarrhea Musculoskeletal: Patient ***denies muscle cramps or weakness Neurologic: Patient ***denies convulsions or seizures Psychiatric: Patient ***denies memory problems Allergic/Immunologic: Patient ***denies recent allergic reaction(s) Hematologic/Lymphatic: Patient denies bleeding tendencies Endocrine: Patient ***denies heat/cold intolerance  GU: As per HPI.  OBJECTIVE There were no vitals filed for this visit. There is no height or weight on file to calculate BMI.  Physical Examination Constitutional: ***No obvious distress; patient is ***non-toxic appearing  Cardiovascular: ***No visible lower extremity edema.  Respiratory: The patient does ***not have audible wheezing/stridor; respirations do ***not appear labored  Gastrointestinal: Abdomen ***non-distended Musculoskeletal:  ***Normal ROM of UEs  Skin: ***No obvious rashes/open sores  Neurologic: CN 2-12 grossly ***intact Psychiatric: Answered questions ***appropriately with ***normal affect  Hematologic/Lymphatic/Immunologic: ***No obvious bruises or sites of spontaneous bleeding  UA: ***negative / *** WBC/hpf, *** RBC/hpf, bacteria (***) PVR: *** ml  ASSESSMENT No diagnosis found. ***  Will plan for follow up in *** months / ***1 year or sooner if needed. Pt verbalized understanding and agreement. All questions were answered.  PLAN Advised the following: 1. *** 2. ***No follow-ups on file.  No orders of the defined types were placed in this encounter.   It has  been explained that the patient is to follow regularly with their PCP in addition to all other providers involved in their care and to follow instructions provided by these respective offices. Patient advised to contact urology clinic if any urologic-pertaining questions, concerns, new symptoms or problems arise in the interim period.  There are no Patient Instructions on file for this visit.  Electronically signed by:  Donnita Falls, FNP   10/07/22    8:43 AM  ,

## 2022-10-08 ENCOUNTER — Ambulatory Visit (INDEPENDENT_AMBULATORY_CARE_PROVIDER_SITE_OTHER): Payer: Medicare Other | Admitting: Urology

## 2022-10-08 ENCOUNTER — Encounter: Payer: Self-pay | Admitting: Urology

## 2022-10-08 VITALS — BP 98/61 | HR 74 | Temp 97.4°F

## 2022-10-08 DIAGNOSIS — R399 Unspecified symptoms and signs involving the genitourinary system: Secondary | ICD-10-CM

## 2022-10-08 DIAGNOSIS — C61 Malignant neoplasm of prostate: Secondary | ICD-10-CM

## 2022-10-08 DIAGNOSIS — Z87442 Personal history of urinary calculi: Secondary | ICD-10-CM | POA: Diagnosis not present

## 2022-10-08 LAB — URINALYSIS, ROUTINE W REFLEX MICROSCOPIC
Bilirubin, UA: NEGATIVE
Glucose, UA: NEGATIVE
Ketones, UA: NEGATIVE
Nitrite, UA: NEGATIVE
Protein,UA: NEGATIVE
RBC, UA: NEGATIVE
Specific Gravity, UA: 1.01 (ref 1.005–1.030)
Urobilinogen, Ur: 0.2 mg/dL (ref 0.2–1.0)
pH, UA: 6 (ref 5.0–7.5)

## 2022-10-08 LAB — MICROSCOPIC EXAMINATION: Bacteria, UA: NONE SEEN

## 2022-10-08 LAB — BLADDER SCAN AMB NON-IMAGING: Scan Result: 119

## 2022-10-08 NOTE — Progress Notes (Signed)
Uroflow  Peak Flow: 17ml Average Flow: 5ml Voided Volume: Voiding Time: 66sec Flow Time: 18sec Time to Peak Flow: 7sec  PVR Volume: 

## 2022-10-08 NOTE — Patient Instructions (Signed)
Overactive bladder (OAB) overview for patients:  Symptoms may include: urinary urgency ("gotta go" feeling) urinary frequency (voiding >8 times per day) night time urination (nocturia) urge incontinence of urine (UUI)  While we do not know the exact etiology of OAB, several treatment options exist including:  Behavioral therapy: Reducing fluid intake Decreasing bladder stimulants (such as caffeine) and irritants (such as acidic food, spicy foods, alcohol) Urge suppression strategies Bladder retraining via timed voiding Pelvic floor physical therapy  Medication(s) - can use one or both of the drug classes below. Anticholinergic / antimuscarinic medications:  Mechanism of action: Activate M3 receptors to reduce detrusor stimulation and increase bladder capacity  (parasympathetic nervous system). Effect: Relaxes the bladder to decrease overactivity, increase bladder storage capacity, and increase time between voids. Onset: Slow acting (may take 8-12 weeks to determine efficacy). Medications include: Vesicare (Solifenacin), Ditropan (Oxybutynin), Detrol (Tolterodine), Toviaz (Fesoterodine), Sanctura (Trospium), Urispas (Flavoxate), Enablex (Darifenacin), Bentyl (Dicyclomine), Levsin (Hyoscyamine ). Potential side effects include but are not limited to: Dry eyes, dry mouth, constipation, cognitive impairment, dementia risk with long term use, and urinary retention/ incomplete bladder emptying. Insurance companies generally prefer for patients to try 1-2 anticholinergic / antimuscarinic medications first due to low cost. Some exceptions are made based on patient-specific comorbidities / risk factors. Beta-3 agonist medications: Mechanism of action: Stimulates selective B3 adrenergic receptors to cause smooth muscle bladder relaxation (sympathetic nervous system). Effect: Relaxes the bladder to decrease overactivity, increase bladder storage capacity, and increase time between voids. Onset:  Slow acting (may take 8-12 weeks to determine efficacy). Medications include: Myrbetriq (Mirabegron) and Vibegron (Gemtesa). Potential side effects include but are not limited to: urinary retention / incomplete bladder emptying and elevated blood pressure (more likely to occur in individuals with pre-existing uncontrolled hypertension). These medications tend to be more expensive than the anticholinergic / antimuscarinic medications.   For patients with refractory OAB (if the above treatment options have been unsuccessful): Posterior tibial nerve stimulation (PTNS). Small acupuncture-type needle inserted near ankle with electric current to stimulate bladder via posterior tibial nerve pathway. Initially requires 12 weekly in-office treatments lasting 30 minutes each; followed by monthly in-office treatments lasting 30 minutes each for 1 year.  Bladder Botox injections. How it is done: Typically done via in-office cystoscopy; sometimes done in the OR depending on the situation. The bladder is numbed with lidocaine instilled via a catheter. Then the urologist injects Botox into the bladder muscle wall in about 20 locations. Causes local paralysis of the bladder muscle at the injection sites to reduce bladder muscle overactivity / spasms. The effect lasts for approximately 6 months and cannot be reversed once performed. Risks may included but are not limited to: infection, incomplete bladder emptying/ urinary retention, short term need for self-catheterization or indwelling catheter, and need for repeat therapy. There is a 5-12% chance of needing to catheterize with Botox - that usually resolves in a few months as the Botox wears off. Typically Botox injections would need to be repeated every 3-12 months since this is not a permanent therapy.  Sacral neuromodulation trial (Medtronic lnterStim or Axonics implant). Sacral neuromodulation is FDA-approved for uncontrolled urinary urgency, urinary frequency,  urinary urge incontinence, non-obstructive urinary retention, or fecal incontinence. It is not FDA-approved as a treatment for pain. The goal of this therapy is at least a 50% improvement in symptoms. It is NOT realistic to expect a 100% cure. This is a a 2-step outpatient procedure. After a successful test period, a permanent wire and generator are placed   in the OR. We discussed the risk of infection. We reviewed the fact that about 30% of patients fail the test phase and are not candidates for permanent generator placement. During the 1-2 week trial phase, symptoms are documented by the patient to determine response. If patient gets at least a 50% improvement in symptoms, they may then proceed with Step 2. Step 1: Trial lead placement. Per physician discretion, may done one of two ways: Percutaneous nerve evaluation (PNE) in the [facility] office. Performed by urologist under local anesthesia (numbing the area with lidocaine) using a spinal needle for placement of test wire, which usually stays in place for 5-7 days to determine therapy response. Test lead placement in OR under anesthesia. Usually stays in place 2 weeks to determine therapy response. > Step 2: Permanent implantation of sacral neuromodulation device, which is performed in the OR.  Sacral neuromodulation implants: All are conditionally MRI safe. Manufacturer: Medtronic Website: www.medtronic.com/[place]-en/patients/treatments-therapies/neurostimulator-overactive-bladder/getting therapy/right-for-you.html Options: lnterStim X: Non-rechargeable. The battery lasts 10 years on average. lnterStim Micro: Rechargeable. The battery lasts 15 years on average and must be charged routinely. Approximately 50% smaller implant than lnterStim X implant.  Manufacturer: Axonics Website: Findrealrelief.axonics.com Options: Non-rechargeable (Axonics F15): The battery lasts 15 years on average. Rechargeable (Axonics R20): The battery lasts 20 years on  average and must be charged in office for about 1 hour every 6-10 months on average. Approximately 50% smaller implant than Axonics non-rechargeable implant.  Note: Generally the rechargeable devices are only advised for very small or thin patients who may not have sufficient adipose tissue to comfortably overlay the implanted device.  Suprapubic catheter (SP tube) placement. Only done in severely refractory OAB when all other options have failed or are not a viable treatment choice depending on patient factors. Involves placement of a catheter through the lower abdomen into the bladder to continuously drain the bladder into an external collection bag, which patient can then empty at their convenience every few hours. Done via an outpatient surgical procedure in the OR under anesthesia. Risks may included but are not limited to: surgical site pain, infections, skin irritation / breakdown, chronic bacteriuria, symptomatic UTls. The SP tube must stay in place continuously. This is a reversible procedure however - the insertion site will close if catheter is removed for more than a few hours. The SP tube must be exchanged routinely every 4 weeks to prevent the catheter from becoming clogged with sediment. SP tube exchanges are typically performed at a urology nurse visit or by a home health nurse.  

## 2022-10-15 ENCOUNTER — Other Ambulatory Visit: Payer: Self-pay

## 2022-10-16 ENCOUNTER — Telehealth: Payer: Self-pay | Admitting: Urology

## 2022-10-16 ENCOUNTER — Ambulatory Visit: Payer: Medicare Other

## 2022-10-16 DIAGNOSIS — N390 Urinary tract infection, site not specified: Secondary | ICD-10-CM

## 2022-10-16 DIAGNOSIS — C61 Malignant neoplasm of prostate: Secondary | ICD-10-CM

## 2022-10-16 LAB — URINALYSIS, ROUTINE W REFLEX MICROSCOPIC
Bilirubin, UA: NEGATIVE
Glucose, UA: NEGATIVE
Ketones, UA: NEGATIVE
Nitrite, UA: NEGATIVE
Protein,UA: NEGATIVE
RBC, UA: NEGATIVE
Specific Gravity, UA: 1.01 (ref 1.005–1.030)
Urobilinogen, Ur: 0.2 mg/dL (ref 0.2–1.0)
pH, UA: 6 (ref 5.0–7.5)

## 2022-10-16 LAB — MICROSCOPIC EXAMINATION

## 2022-10-16 MED ORDER — NITROFURANTOIN MONOHYD MACRO 100 MG PO CAPS
100.0000 mg | ORAL_CAPSULE | Freq: Two times a day (BID) | ORAL | 0 refills | Status: DC
Start: 2022-10-16 — End: 2023-10-12

## 2022-10-16 NOTE — Telephone Encounter (Signed)
Patient states that he is having buring with urination. Patient is aware to do a urine drop for testing. Voiced understanding

## 2022-10-16 NOTE — Progress Notes (Signed)
Patient presents today with complaints of  burning with urination.  UA and Culture done today.  Dr. Ronne Binning reviewed results and Macrobid BID .  Patient aware of MD recommendations and that we will reach out with culture results.      ZOXWRUEA, CMA

## 2022-10-16 NOTE — Telephone Encounter (Signed)
Patient said that he is having burning when he urinates and they would like you to call in a antibiotic for him    Pharmacy  [facility]

## 2022-11-06 ENCOUNTER — Ambulatory Visit: Payer: Medicare Other | Admitting: Urology

## 2022-11-06 VITALS — BP 126/65 | HR 70

## 2022-11-06 DIAGNOSIS — N401 Enlarged prostate with lower urinary tract symptoms: Secondary | ICD-10-CM | POA: Diagnosis not present

## 2022-11-06 DIAGNOSIS — N138 Other obstructive and reflux uropathy: Secondary | ICD-10-CM | POA: Diagnosis not present

## 2022-11-06 DIAGNOSIS — R339 Retention of urine, unspecified: Secondary | ICD-10-CM

## 2022-11-06 DIAGNOSIS — C61 Malignant neoplasm of prostate: Secondary | ICD-10-CM | POA: Diagnosis not present

## 2022-11-06 LAB — URINALYSIS, ROUTINE W REFLEX MICROSCOPIC
Bilirubin, UA: NEGATIVE
Glucose, UA: NEGATIVE
Ketones, UA: NEGATIVE
Leukocytes,UA: NEGATIVE
Nitrite, UA: NEGATIVE
Protein,UA: NEGATIVE
RBC, UA: NEGATIVE
Specific Gravity, UA: 1.01 (ref 1.005–1.030)
Urobilinogen, Ur: 1 mg/dL (ref 0.2–1.0)
pH, UA: 6 (ref 5.0–7.5)

## 2022-11-06 MED ORDER — TAMSULOSIN HCL 0.4 MG PO CAPS
0.4000 mg | ORAL_CAPSULE | Freq: Two times a day (BID) | ORAL | 11 refills | Status: DC
Start: 2022-11-06 — End: 2023-09-15

## 2022-11-06 NOTE — Progress Notes (Unsigned)
post void residual=48

## 2022-11-06 NOTE — Progress Notes (Unsigned)
11/06/2022 10:16 AM   Karen Kitchens Alveda Reasons 1926/03/14 [id number]  Referring provider: Center, [facility] [address] [place],  [place] [address]  No chief complaint on file.   HPI: PSA 1.5 and next PSA scheduled for 11/1. IPSS 9 QOL 2 on flomax BID. UA normal   PMH: Past Medical History:  Diagnosis Date   Acid reflux    Acute colitis 09/16/2019   AKI (acute kidney injury) (HCC) 07/31/2012   Anemia    BENIGN PROSTATIC HYPERTROPHY, WITH OBSTRUCTION 12/18/2005   Qualifier: Diagnosis of  By: Magdalene River     Chest pain 07/31/2012   Colitis 09/16/2019   Colon cancer (HCC)    per patient, colon cancer more than 10 years ago s/p right hemicolectomy   Elevated bilirubin    Elevated LFTs    Ex-cigarette smoker    Gout    Hyperlipemia    Hypertension    Hyponatremia 08/01/2021   Intractable abdominal pain 02/03/2021   LATERAL MENISCUS TEAR, RIGHT 04/19/2006   Qualifier: Diagnosis of  By: Erby Pian MD, Cornelius     Leukocytosis, unspecified 07/31/2012   Paroxysmal atrial fibrillation (HCC) 2014   Presence of permanent cardiac pacemaker    Prostate cancer (HCC) 08/08/2013   SEEDS 20 YEARS AGO   Prostate disease    RENAL CALCULUS 12/18/2005   Qualifier: Diagnosis of  By: Magdalene River     RUQ pain    UTI (urinary tract infection) 03/26/2021    Surgical History: Past Surgical History:  Procedure Laterality Date   BIOPSY  02/05/2021   Procedure: BIOPSY;  Surgeon: Dolores Frame, MD;  [facility];  Service: Gastroenterology;;   BIOPSY  08/02/2021   Procedure: BIOPSY;  Surgeon: Corbin Ade, MD;  [facility];  Service: Endoscopy;;   COLON SURGERY     Right hemicolectomy in the setting of colon cancer, per patient   CYSTOSCOPY WITH INSERTION OF UROLIFT N/A 05/19/2021   Procedure: CYSTOSCOPY WITH INSERTION OF UROLIFT;  Surgeon: Malen Gauze, MD;  Location: [facility];  Service: Urology;  Laterality: N/A;   ENTEROSCOPY N/A 11/21/2021    Procedure: PUSH ENTEROSCOPY;  Surgeon: Dolores Frame, MD;  [facility];  Service: Gastroenterology;  Laterality: N/A;   ESOPHAGOGASTRODUODENOSCOPY (EGD) WITH PROPOFOL N/A 02/05/2021   Procedure: ESOPHAGOGASTRODUODENOSCOPY (EGD) WITH PROPOFOL;  Surgeon: Dolores Frame, MD;  [facility];  Service: Gastroenterology;  Laterality: N/A;   ESOPHAGOGASTRODUODENOSCOPY (EGD) WITH PROPOFOL N/A 08/02/2021   Procedure: ESOPHAGOGASTRODUODENOSCOPY (EGD) WITH PROPOFOL;  Surgeon: Corbin Ade, MD;  [facility];  Service: Endoscopy;  Laterality: N/A;   ESOPHAGOGASTRODUODENOSCOPY (EGD) WITH PROPOFOL N/A 11/21/2021   Procedure: ESOPHAGOGASTRODUODENOSCOPY (EGD) WITH PROPOFOL;  Surgeon: Dolores Frame, MD;  [facility];  Service: Gastroenterology;  Laterality: N/A;  1200  ASA 3   HOT HEMOSTASIS  08/02/2021   Procedure: HOT HEMOSTASIS (ARGON PLASMA COAGULATION/BICAP);  Surgeon: Corbin Ade, MD;  [facility];  Service: Endoscopy;;   HOT HEMOSTASIS  11/21/2021   Procedure: HOT HEMOSTASIS (ARGON PLASMA COAGULATION/BICAP);  Surgeon: Marguerita Merles, Reuel Boom, MD;  [facility];  Service: Gastroenterology;;   LEFT HEART CATHETERIZATION WITH CORONARY ANGIOGRAM N/A 07/21/2012   Procedure: LEFT HEART CATHETERIZATION WITH CORONARY ANGIOGRAM;  Surgeon: Lennette Bihari, MD;  Location: [facility] CATH LAB;  Service: Cardiovascular;  Laterality: N/A;   PACEMAKER INSERTION     PERMANENT PACEMAKER INSERTION N/A 07/22/2012   Procedure: PERMANENT PACEMAKER INSERTION;  Surgeon: Thurmon Fair, MD;  Location: [facility] CATH LAB;  Service: Cardiovascular;  Laterality: N/A;   PROSTATE SURGERY      Home Medications:  Allergies as of 11/06/2022   No Known Allergies      Medication List        Accurate as of November 06, 2022 10:16 AM. If you have any questions, ask your nurse or doctor.          acetaminophen 650 MG CR  tablet Commonly known as: TYLENOL Take 1,300 mg by mouth every 8 (eight) hours as needed for pain.   amLODipine 10 MG tablet Commonly known as: NORVASC Take 1 tablet by mouth daily.   carvedilol 3.125 MG tablet Commonly known as: COREG Take 3.125 mg by mouth 2 (two) times daily with a meal.   diclofenac sodium 1 % Gel Commonly known as: VOLTAREN Apply 2 g topically 4 (four) times daily.   Ensure Take 237 mLs by mouth daily as needed (nutrition). Vanilla or chocolate   ferrous sulfate 324 MG Tbec Take 324 mg by mouth. Takes one tablet every other day   furosemide 20 MG tablet Commonly known as: LASIX Take 20 mg by mouth. One half tablet daily   lisinopril 10 MG tablet Commonly known as: ZESTRIL Take 1 tablet by mouth daily.   nitrofurantoin (macrocrystal-monohydrate) 100 MG capsule Commonly known as: MACROBID Take 1 capsule (100 mg total) by mouth every 12 (twelve) hours.   Nubeqa 300 MG tablet Generic drug: darolutamide Take 1 tablet (300 mg total) by mouth 2 (two) times daily with a meal.   pantoprazole 40 MG tablet Commonly known as: PROTONIX Take 1 tablet (40 mg total) by mouth daily.   predniSONE 5 MG tablet Commonly known as: DELTASONE TAKE ONE TABLET BY MOUTH ONCE DAILY WITH BREAKFAST.   tamsulosin 0.4 MG Caps capsule Commonly known as: FLOMAX Take 1 capsule (0.4 mg total) by mouth 2 (two) times daily.   traMADol 50 MG tablet Commonly known as: ULTRAM Take 1 tablet (50 mg total) by mouth at bedtime as needed.        Allergies: No Known Allergies  Family History: Family History  Problem Relation Age of Onset   Cancer Neg Hx     Social History:  reports that he quit smoking about 20 years ago. His smoking use included cigarettes. He has been exposed to tobacco smoke. He has quit using smokeless tobacco.  His smokeless tobacco use included chew. He reports that he does not drink alcohol and does not use drugs.  ROS: All other review of systems  were reviewed and are negative except what is noted above in HPI  Physical Exam: BP 126/65   Pulse 70   Constitutional:  Alert and oriented, No acute distress. HEENT: [place] AT, moist mucus membranes.  Trachea midline, no masses. Cardiovascular: No clubbing, cyanosis, or edema. Respiratory: Normal respiratory effort, no increased work of breathing. GI: Abdomen is soft, nontender, nondistended, no abdominal masses GU: No CVA tenderness.  Lymph: No cervical or inguinal lymphadenopathy. Skin: No rashes, bruises or suspicious lesions. Neurologic: Grossly intact, no focal deficits, moving all 4 extremities. Psychiatric: Normal mood and affect.  Laboratory Data: Lab Results  Component Value Date   WBC 4.1 09/10/2022   HGB 10.6 (L) 09/10/2022   HCT 32.6 (L) 09/10/2022   MCV 101.2 (H) 09/10/2022   PLT 216 09/10/2022    Lab Results  Component Value Date   CREATININE 1.29 (H) 09/10/2022    Lab Results  Component Value Date   PSA Refused 12/22/2007   PSA 0.07 (L) 06/24/2007   PSA 0.10 05/25/2006    No results found for: "TESTOSTERONE"  Lab Results  Component Value Date   HGBA1C 5.6 07/19/2012    Urinalysis    Component Value Date/Time   COLORURINE YELLOW 10/25/2021 2010   APPEARANCEUR Clear 10/16/2022 1222   LABSPEC 1.017 10/25/2021 2010   PHURINE 6.0 10/25/2021 2010   GLUCOSEU Negative 10/16/2022 1222   HGBUR MODERATE (A) 10/25/2021 2010   HGBUR negative 06/24/2007 0833   BILIRUBINUR Negative 10/16/2022 1222   KETONESUR NEGATIVE 10/25/2021 2010   PROTEINUR Negative 10/16/2022 1222   PROTEINUR 100 (A) 10/25/2021 2010   UROBILINOGEN 1.0 07/29/2013 2032   NITRITE Negative 10/16/2022 1222   NITRITE NEGATIVE 10/25/2021 2010   LEUKOCYTESUR 1+ (A) 10/16/2022 1222   LEUKOCYTESUR MODERATE (A) 10/25/2021 2010    Lab Results  Component Value Date   LABMICR See below: 10/16/2022   WBCUA 6-10 (A) 10/16/2022   LABEPIT 0-10 10/16/2022   MUCUS Present (A) 10/08/2022    BACTERIA Few 10/16/2022    Pertinent Imaging: *** Results for orders placed in visit on 07/29/01  DG Abd 1 View  Narrative FINDINGS CLINICAL DATA:  PROSTATE CA. SINGLE VIEW ABDOMEN A NORMAL BOWEL GAS PATTERN IS DEMONSTRATED.  MILD TO MODERATE SPUR FORMATION IS DEMONSTRATED AT MULTIPLE LEVELS OF THE LUMBAR SPINE AS WELL AS MINIMAL LEVOCONVEX LUMBAR ROTARY SCOLIOSIS.  ALSO NOTED IS BRIDGING OSSIFICATION OR CALCIFICATION BETWEEN THE LOWER LUMBAR SPINE AND LEFT ILIAC WING. A TRANSITIONAL LUMBOSACRAL VERTEBRA IS ALSO DEMONSTRATED.  MILD ATHEROMATOUS ARTERIAL CALCIFICATIONS ARE NOTED. IMPRESSION NO ACUTE ABNORMALITY AND NO EVIDENCE OF BONY METASTATIC DISEASE.  No results found for this or any previous visit.  No results found for this or any previous visit.  No results found for this or any previous visit.  Results for orders placed during the hospital encounter of 08/01/13  US Renal  Narrative CLINICAL DATA:  Acute urinary retention  EXAM: RENAL/URINARY TRACT ULTRASOUND COMPLETE  COMPARISON:  04/14/2004  FINDINGS: Right Kidney:  Length: 10.2 cm. Complex 2.0 x 1.8 x 1.6 cm echogenic lesion, right kidney lower pole. We were not able the demonstrate definite blood flow within this lesion. Hypoechoic but minimally complex 2.6 x 2.2 x 1.9 cm lesion, right kidney lower pole.  Left Kidney:  Length: 1.3 cm. Mild prominence of renal sinus adipose tissue. Tiny probable cyst, left mid-upper kidney, 4 mm. Equivocal small mid kidney nonobstructive calculus.  Bladder:  Nondistended.  Catheter in urinary bladder.  IMPRESSION: 1. Complex 2 cm echogenic lesion, right kidney lower pole, in vicinity of prior enhancing nodule. In 2006 this lesion measured 1.4 cm. This may represent slow growth of a small renal cell carcinoma. 2. Minimally complex 2.6 cm right kidney lower pole lesion, most likely a complex cyst although technically nonspecific. 3. Equivocal small left mid kidney  nonobstructive calculus. Mild renal sinus adipose tissue prominence on the left.   Electronically Signed By: Herbie Baltimore M.D. On: 08/01/2013 15:54  No valid procedures specified. No results found for this or any previous visit.  Results for orders placed during the hospital encounter of 02/19/21  CT Renal Stone Study  Narrative CLINICAL DATA:  Urinary retention.  History of prostate cancer.  EXAM: CT ABDOMEN AND PELVIS WITHOUT CONTRAST  TECHNIQUE: Multidetector CT imaging of the abdomen and pelvis was performed following the standard protocol without IV contrast.  COMPARISON:  02/03/2021.  FINDINGS: Lower chest: The heart is  normal in size and scattered coronary artery calcifications are noted. Pacemaker leads are present in the heart. A 4 mm nodule is present in the right lower lobe, axial image 8.  Hepatobiliary: The liver is within normal limits. Mild intrahepatic and extrahepatic biliary ductal dilatation is seen which is likely due to patient's post cholecystectomy status.  Pancreas: Unremarkable. No pancreatic ductal dilatation or surrounding inflammatory changes.  Spleen: Normal in size without focal abnormality.  Adrenals/Urinary Tract: There is redemonstration of a right adrenal mass measuring 3.2 cm, unchanged. The left adrenal gland is within normal limits. Scattered hypo and hyperdense structures are present in the kidneys bilaterally. Complex masslike lesions are noted in the kidneys bilaterally measuring 2.6 cm in the mid left kidney and 2.6 cm in the mid right kidney. No renal or ureteral calculus or obstructive uropathy bilaterally. The bladder is unremarkable.  Stomach/Bowel: The stomach is nondistended. No bowel obstruction, free air or pneumatosis. A few scattered diverticular noted along the colon without evidence of diverticulitis. Right hemicolectomy changes are noted.  Vascular/Lymphatic: Aortic atherosclerosis with ectasia of  the distal abdominal aorta. 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 The common iliac artery on the left measures 1.8 cm. No abdominal or pelvic lymphadenopathy.  Reproductive: The prostate gland is enlarged and contains multiple radiopaque brachytherapy seeds.  Other: No free fluid. A small fat containing umbilical hernia is noted. There is a small right inguinal fat containing hernia.  Musculoskeletal: Degenerative changes are present in the thoracolumbar spine and bilateral hips. A stable sclerotic lesion is noted in the L2 vertebral body on the right, possible bone island.  IMPRESSION: 1. No renal or ureteral calculus or obstructive uropathy bilaterally. 2. Multiple hyperdense and hypodense lesions in the kidneys bilaterally. Complex masses are noted in the mid kidneys bilaterally, compatible with history of multifocal renal cell carcinoma and slightly increased in size from the prior exam. 3. Enlarged prostate gland containing brachytherapy seeds. 4. Right lower lobe pulmonary nodule. No follow-up needed if patient is low-risk. Non-contrast chest CT can be considered in 12 months if patient is high-risk. This recommendation follows the consensus statement: Guidelines for Management of Incidental Pulmonary Nodules Detected on CT Images: From the Fleischner Society 2017; Radiology 2017; 284:228-243. 5. Aortic atherosclerosis. 6. Coronary artery calcifications. 7. Remaining chronic findings as described above.   Electronically Signed By: Thornell Sartorius M.D. On: 02/20/2021 00:28   Assessment & Plan:    1. Prostate cancer (HCC) Continue management per [facility] - Urinalysis, Routine w reflex microscopic - BLADDER SCAN AMB NON-IMAGING  2. Benign prostatic hyperplasia with urinary obstruction -continue flomax 0.4mg  BID  3. Urinary retention -resolved   No follow-ups on file.  Wilkie Aye, MD  [facility]

## 2022-11-09 ENCOUNTER — Other Ambulatory Visit: Payer: Self-pay | Admitting: Hematology

## 2022-11-10 ENCOUNTER — Encounter: Payer: Self-pay | Admitting: Urology

## 2022-11-10 NOTE — Patient Instructions (Signed)

## 2022-11-17 ENCOUNTER — Other Ambulatory Visit (HOSPITAL_COMMUNITY): Payer: Self-pay

## 2022-11-17 ENCOUNTER — Other Ambulatory Visit: Payer: Self-pay | Admitting: Pharmacy Technician

## 2022-11-17 NOTE — Progress Notes (Signed)
Specialty Pharmacy Refill Coordination Note  Derrick Long is a 89 y.o. male contacted today regarding refills of specialty medication(s) Darolutamide .  Patient requested Delivery  on 11/25/22  to verified address [address] [place]   Medication will be filled on 11/24/22.

## 2022-12-10 ENCOUNTER — Other Ambulatory Visit (HOSPITAL_COMMUNITY): Payer: Self-pay

## 2022-12-10 NOTE — Progress Notes (Signed)
Specialty Pharmacy Ongoing Clinical Assessment Note  Derrick Long is a 89 y.o. male who is being followed by the specialty pharmacy service for RxSp Oncology   Patient's specialty medication(s) reviewed today: Darolutamide   Missed doses in the last 4 weeks: 0   Patient/Caregiver did not have any additional questions or concerns.   Therapeutic benefit summary: Patient is achieving benefit   Adverse events/side effects summary: No adverse events/side effects   Patient's therapy is appropriate to: Continue    Goals Addressed             This Visit's Progress    Slow Disease Progression       Patient is on track. Patient will maintain adherence.  PSA fluctuates but has remained relatively stable.          Follow up:  6 months  Servando Snare Specialty Pharmacist

## 2022-12-10 NOTE — Progress Notes (Signed)
Specialty Pharmacy Refill Coordination Note  Derrick Long is a 89 y.o. male contacted today regarding refills of specialty medication(s) Darolutamide   Patient requested Delivery   Delivery date: 12/22/22   Verified address: [address], [place], [place] [address]   Medication will be filled on 12/21/22.

## 2022-12-23 ENCOUNTER — Other Ambulatory Visit: Payer: Self-pay

## 2022-12-23 DIAGNOSIS — C61 Malignant neoplasm of prostate: Secondary | ICD-10-CM

## 2022-12-24 ENCOUNTER — Inpatient Hospital Stay: Payer: Medicare Other | Attending: Hematology

## 2022-12-24 DIAGNOSIS — I129 Hypertensive chronic kidney disease with stage 1 through stage 4 chronic kidney disease, or unspecified chronic kidney disease: Secondary | ICD-10-CM | POA: Diagnosis not present

## 2022-12-24 DIAGNOSIS — D631 Anemia in chronic kidney disease: Secondary | ICD-10-CM | POA: Insufficient documentation

## 2022-12-24 DIAGNOSIS — N189 Chronic kidney disease, unspecified: Secondary | ICD-10-CM | POA: Diagnosis not present

## 2022-12-24 DIAGNOSIS — C7971 Secondary malignant neoplasm of right adrenal gland: Secondary | ICD-10-CM | POA: Insufficient documentation

## 2022-12-24 DIAGNOSIS — C61 Malignant neoplasm of prostate: Secondary | ICD-10-CM | POA: Diagnosis present

## 2022-12-24 DIAGNOSIS — M549 Dorsalgia, unspecified: Secondary | ICD-10-CM | POA: Insufficient documentation

## 2022-12-24 DIAGNOSIS — D539 Nutritional anemia, unspecified: Secondary | ICD-10-CM

## 2022-12-24 LAB — FERRITIN: Ferritin: 231 ng/mL (ref 24–336)

## 2022-12-24 LAB — COMPREHENSIVE METABOLIC PANEL
ALT: 10 U/L (ref 0–44)
AST: 17 U/L (ref 15–41)
Albumin: 3.3 g/dL — ABNORMAL LOW (ref 3.5–5.0)
Alkaline Phosphatase: 48 U/L (ref 38–126)
Anion gap: 7 (ref 5–15)
BUN: 20 mg/dL (ref 8–23)
CO2: 23 mmol/L (ref 22–32)
Calcium: 8.9 mg/dL (ref 8.9–10.3)
Chloride: 104 mmol/L (ref 98–111)
Creatinine, Ser: 1.38 mg/dL — ABNORMAL HIGH (ref 0.61–1.24)
GFR, Estimated: 47 mL/min — ABNORMAL LOW (ref >60)
Glucose, Bld: 112 mg/dL — ABNORMAL HIGH (ref 70–99)
Potassium: 4 mmol/L (ref 3.5–5.1)
Sodium: 134 mmol/L — ABNORMAL LOW (ref 135–145)
Total Bilirubin: 0.4 mg/dL (ref <1.2)
Total Protein: 6.6 g/dL (ref 6.5–8.1)

## 2022-12-24 LAB — CBC
HCT: 31.7 % — ABNORMAL LOW (ref 39.0–52.0)
Hemoglobin: 10 g/dL — ABNORMAL LOW (ref 13.0–17.0)
MCH: 32.6 pg (ref 26.0–34.0)
MCHC: 31.5 g/dL (ref 30.0–36.0)
MCV: 103.3 fL — ABNORMAL HIGH (ref 80.0–100.0)
Platelets: 248 10*3/uL (ref 150–400)
RBC: 3.07 MIL/uL — ABNORMAL LOW (ref 4.22–5.81)
RDW: 13 % (ref 11.5–15.5)
WBC: 3.2 10*3/uL — ABNORMAL LOW (ref 4.0–10.5)
nRBC: 0 % (ref 0.0–0.2)

## 2022-12-24 LAB — IRON AND TIBC
Iron: 70 ug/dL (ref 45–182)
Saturation Ratios: 31 % (ref 17.9–39.5)
TIBC: 223 ug/dL — ABNORMAL LOW (ref 250–450)
UIBC: 153 ug/dL

## 2022-12-24 LAB — GENETIC SCREENING ORDER

## 2022-12-24 LAB — PSA: Prostatic Specific Antigen: 2.04 ng/mL (ref 0.00–4.00)

## 2022-12-31 ENCOUNTER — Inpatient Hospital Stay: Payer: Medicare Other | Admitting: Hematology

## 2022-12-31 VITALS — BP 145/70 | HR 70 | Temp 97.8°F | Resp 16 | Wt 166.6 lb

## 2022-12-31 DIAGNOSIS — I129 Hypertensive chronic kidney disease with stage 1 through stage 4 chronic kidney disease, or unspecified chronic kidney disease: Secondary | ICD-10-CM | POA: Diagnosis not present

## 2022-12-31 DIAGNOSIS — C61 Malignant neoplasm of prostate: Secondary | ICD-10-CM | POA: Diagnosis not present

## 2022-12-31 DIAGNOSIS — C7971 Secondary malignant neoplasm of right adrenal gland: Secondary | ICD-10-CM | POA: Diagnosis not present

## 2022-12-31 DIAGNOSIS — M549 Dorsalgia, unspecified: Secondary | ICD-10-CM | POA: Diagnosis not present

## 2022-12-31 DIAGNOSIS — D631 Anemia in chronic kidney disease: Secondary | ICD-10-CM | POA: Diagnosis not present

## 2022-12-31 DIAGNOSIS — N189 Chronic kidney disease, unspecified: Secondary | ICD-10-CM | POA: Diagnosis not present

## 2022-12-31 NOTE — Progress Notes (Signed)
[facility] [address], [place] [address]    Clinic Day:  12/31/2022  Referring physician: Center, [facility]*  Patient Care Team: Center, [facility] as PCP - General (General Practice) Therese Sarah, RN as Oncology Nurse Navigator (Oncology) Doreatha Massed, MD as Medical Oncologist (Medical Oncology)   ASSESSMENT & PLAN:   Assessment: 1.  Metastatic prostate cancer to the right adrenal gland: -Presentation to the ER with worsening right-sided back pain, which has been on and off for the last couple of years. - Some decrease in appetite with 6 pound weight loss in the last 6 months.  No hematuria or other B symptoms. - CT renal study on 05/03/2020 showed new right adrenal mass with bilateral renal lesions.  Indeterminate L2 vertebral bodies sclerotic lesion.  Low-attenuation lesions in the kidneys measure up to 2.3 cm on the right and were similar to scan from July 2021. - CT CAP on 06/12/2020 did not show any evidence of nodal metastasis or metastatic disease in the chest.  Right adrenal mass measures 4.2 x 3.8 cm exerts mass-effect upon the posterior and right lateral wall of the IVC.  Right kidney masses measuring 2.3 x 2.3 cm and 1.9 x 1.7 cm suspicious for RCC.  Upper pole of the left kidney lesion 2.2 x 2.0 cm and medial cortex of the inferior pole measuring 2 x 2 cm and another interpolar left kidney lesion measuring 1.3 x 1.5 cm. - Bone scan on 06/13/2020 was negative. - Right adrenal biopsy consistent with prostatic adenocarcinoma - Degarelix on 07/16/2020, transition to Eligard 45 mg on 08/21/2020. - Abiraterone 100 mg daily/prednisone 5 mg daily started around 01/22/2021.,  Discontinued in April 2023.  Not a candidate for Erleada due to blood thinner. - Darolutamide 300 mg twice daily started on 06/19/2021, increased to 600 mg twice daily on 06/26/2021, cut back to 300 mg twice daily on 07/07/2021 due to decreased oral intake and tiredness.    2.  Social/family history: - He lives at home with his daughter.  He walks with a cane. - He mows lawn with a riding mower.  He has a garden and gross vegetables.  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  Quit smoking 25 years ago. - No family history of malignancies that he is aware of.    Plan: 1.  Metastatic prostate cancer to the right adrenal gland: - Last Eligard on 09/17/2022. - He lost about 5 pounds since last visit.  He is taking darolutamide 300 mg twice daily. - Reviewed labs from 12/24/2022: Normal LFTs.  Creatinine 1.38 and stable.  Calcium normal.  PSA increased to 2.04 from 1.5. - We have sent [employer] and genetic testing which is pending. - We discussed different options should he have BRCA1/2, PALB2, ATM mutations.  Otherwise I do not think he is a candidate for chemotherapy.  If he does not have any targetable mutations, I have recommended best supportive care in the form of palliative/hospice care. - Recommend stopping darolutamide.  We will reach out to him with results.   2.  Right sided back pain: - Continue tramadol 50 mg as needed.  He requires it every other day.  3.  Macrocytic anemia: - Combination anemia from CKD, chronic inflammation/early MDS.  Hemoglobin is stable at 10.0.  Ferritin is 231 and percent saturation 31.  No orders of the defined types were placed in this encounter.     I,Helena R Teague,acting as a  scribe for Doreatha Massed, MD.,have documented all relevant documentation on the behalf of Doreatha Massed, MD,as directed by  Doreatha Massed, MD while in the presence of Doreatha Massed, MD.  I, Doreatha Massed MD, have reviewed the above documentation for accuracy and completeness, and I agree with the above.     Doreatha Massed, MD   11/14/20243:39 PM  CHIEF COMPLAINT:   Diagnosis: metastatic prostate cancer to the right adrenal gland    Cancer Staging  Prostate cancer [facility]) Staging form: Prostate,  AJCC 8th Edition - Clinical stage from 07/10/2020: Stage IVB (cTX, cNX, pM1c, PSA: 10.2) - Unsigned    Prior Therapy: Abiraterone 01/22/21 - 05/2021  Current Therapy:  Lupron every 6 months, darolutamide   HISTORY OF PRESENT ILLNESS:   Oncology History   No history exists.     INTERVAL HISTORY:   Derrick Long is a 89 y.o. male presenting to clinic today for follow up of metastatic prostate cancer to the right adrenal gland. He was last seen by me on 09/17/22.  Today, he states that he is doing well overall. His appetite level is at 80%. His energy level is at 50-60%. He is accompanied by his daughter. He denies any recent falls. He reports a decreased appetite and has lost 5 pounds since his last visit. He is taking Nubeqa as prescribed and denies any side effects, including nausea, vomiting, or diarrhea. He walks with a cane and is independent of ADL's and IADL's. His daughter notes he had a Covid infection 4 weeks ago that was treated with Paxclovid.   PAST MEDICAL HISTORY:   Past Medical History: Past Medical History:  Diagnosis Date   Acid reflux    Acute colitis 09/16/2019   AKI (acute kidney injury) (HCC) 07/31/2012   Anemia    BENIGN PROSTATIC HYPERTROPHY, WITH OBSTRUCTION 12/18/2005   Qualifier: Diagnosis of  By: Magdalene River     Chest pain 07/31/2012   Colitis 09/16/2019   Colon cancer (HCC)    per patient, colon cancer more than 10 years ago s/p right hemicolectomy   Elevated bilirubin    Elevated LFTs    Ex-cigarette smoker    Gout    Hyperlipemia    Hypertension    Hyponatremia 08/01/2021   Intractable abdominal pain 02/03/2021   LATERAL MENISCUS TEAR, RIGHT 04/19/2006   Qualifier: Diagnosis of  By: Erby Pian MD, Cornelius     Leukocytosis, unspecified 07/31/2012   Paroxysmal atrial fibrillation (HCC) 2014   Presence of permanent cardiac pacemaker    Prostate cancer (HCC) 08/08/2013   SEEDS 20 YEARS AGO   Prostate disease    RENAL CALCULUS 12/18/2005    Qualifier: Diagnosis of  By: Magdalene River     RUQ pain    UTI (urinary tract infection) 03/26/2021    Surgical History: Past Surgical History:  Procedure Laterality Date   BIOPSY  02/05/2021   Procedure: BIOPSY;  Surgeon: Dolores Frame, MD;  [facility];  Service: Gastroenterology;;   BIOPSY  08/02/2021   Procedure: BIOPSY;  Surgeon: Corbin Ade, MD;  [facility];  Service: Endoscopy;;   COLON SURGERY     Right hemicolectomy in the setting of colon cancer, per patient   CYSTOSCOPY WITH INSERTION OF UROLIFT N/A 05/19/2021   Procedure: CYSTOSCOPY WITH INSERTION OF UROLIFT;  Surgeon: Malen Gauze, MD;  Location: [facility];  Service: Urology;  Laterality: N/A;   ENTEROSCOPY N/A 11/21/2021   Procedure: PUSH ENTEROSCOPY;  Surgeon: Dolores Frame, MD;  [facility];  Service: Gastroenterology;  Laterality: N/A;   ESOPHAGOGASTRODUODENOSCOPY (EGD) WITH PROPOFOL N/A 02/05/2021   Procedure: ESOPHAGOGASTRODUODENOSCOPY (EGD) WITH PROPOFOL;  Surgeon: Dolores Frame, MD;  [facility];  Service: Gastroenterology;  Laterality: N/A;   ESOPHAGOGASTRODUODENOSCOPY (EGD) WITH PROPOFOL N/A 08/02/2021   Procedure: ESOPHAGOGASTRODUODENOSCOPY (EGD) WITH PROPOFOL;  Surgeon: Corbin Ade, MD;  [facility];  Service: Endoscopy;  Laterality: N/A;   ESOPHAGOGASTRODUODENOSCOPY (EGD) WITH PROPOFOL N/A 11/21/2021   Procedure: ESOPHAGOGASTRODUODENOSCOPY (EGD) WITH PROPOFOL;  Surgeon: Dolores Frame, MD;  [facility];  Service: Gastroenterology;  Laterality: N/A;  1200  ASA 3   HOT HEMOSTASIS  08/02/2021   Procedure: HOT HEMOSTASIS (ARGON PLASMA COAGULATION/BICAP);  Surgeon: Corbin Ade, MD;  [facility];  Service: Endoscopy;;   HOT HEMOSTASIS  11/21/2021   Procedure: HOT HEMOSTASIS (ARGON PLASMA COAGULATION/BICAP);  Surgeon: Marguerita Merles, Reuel Boom, MD;  [facility];   Service: Gastroenterology;;   LEFT HEART CATHETERIZATION WITH CORONARY ANGIOGRAM N/A 07/21/2012   Procedure: LEFT HEART CATHETERIZATION WITH CORONARY ANGIOGRAM;  Surgeon: Lennette Bihari, MD;  Location: [facility];  Service: Cardiovascular;  Laterality: N/A;   PACEMAKER INSERTION     PERMANENT PACEMAKER INSERTION N/A 07/22/2012   Procedure: PERMANENT PACEMAKER INSERTION;  Surgeon: Thurmon Fair, MD;  Location: [facility];  Service: Cardiovascular;  Laterality: N/A;   PROSTATE SURGERY      Social History: Social History   Socioeconomic History   Marital status: Married    Spouse name: Not on file   Number of children: Not on file   Years of education: Not on file   Highest education level: Not on file  Occupational History   Not on file  Tobacco Use   Smoking status: Former    Current packs/day: 0.00    Types: Cigarettes    Quit date: 07/20/2002    Years since quitting: 20.4    Passive exposure: Past   Smokeless tobacco: Former    Types: Engineer, drilling   Vaping status: Never Used  Substance and Sexual Activity   Alcohol use: No   Drug use: No   Sexual activity: Not Currently  Other Topics Concern   Not on file  Social History Narrative   Not on file   Social Determinants of Health   Financial Resource Strain: Low Risk  (05/16/2020)   Overall Financial Resource Strain (CARDIA)    Difficulty of Paying Living Expenses: Not hard at all  Food Insecurity: No Food Insecurity (05/16/2020)   Hunger Vital Sign    Worried About Running Out of Food in the Last Year: Never true    Ran Out of Food in the Last Year: Never true  Transportation Needs: No Transportation Needs (05/16/2020)   PRAPARE - Administrator, Civil Service (Medical): No    Lack of Transportation (Non-Medical): No  Physical Activity: Insufficiently Active (05/16/2020)   Exercise Vital Sign    Days of Exercise per Week: 3 days    Minutes of Exercise per Session: 30 min  Stress: No Stress Concern  Present (05/16/2020)   [employer] of Occupational Health - Occupational Stress Questionnaire    Feeling of Stress : Not at all  Social Connections: Moderately Integrated (05/16/2020)   Social Connection and Isolation Panel [NHANES]    Frequency of Communication with Friends and Family: More than three times a week    Frequency of Social Gatherings with Friends and Family:  More than three times a week    Attends Religious Services: More than 4 times per year    Active Member of Clubs or Organizations: No    Attends Banker Meetings: 1 to 4 times per year    Marital Status: Widowed  Intimate Partner Violence: Not At Risk (05/16/2020)   Humiliation, Afraid, Rape, and Kick questionnaire    Fear of Current or Ex-Partner: No    Emotionally Abused: No    Physically Abused: No    Sexually Abused: No    Family History: Family History  Problem Relation Age of Onset   Cancer Neg Hx     Current Medications:  Current Outpatient Medications:    acetaminophen (TYLENOL) 650 MG CR tablet, Take 1,300 mg by mouth every 8 (eight) hours as needed for pain., Disp: , Rfl:    amLODipine (NORVASC) 10 MG tablet, Take 1 tablet by mouth daily., Disp: , Rfl:    carvedilol (COREG) 3.125 MG tablet, Take 3.125 mg by mouth 2 (two) times daily with a meal., Disp: , Rfl:    darolutamide (NUBEQA) 300 MG tablet, Take 1 tablet (300 mg total) by mouth 2 (two) times daily with a meal., Disp: 60 tablet, Rfl: 3   diclofenac sodium (VOLTAREN) 1 % GEL, Apply 2 g topically 4 (four) times daily., Disp: 100 g, Rfl: 0   Ensure (ENSURE), Take 237 mLs by mouth daily as needed (nutrition). Vanilla or chocolate, Disp: , Rfl:    ferrous sulfate 324 MG TBEC, Take 324 mg by mouth. Takes one tablet every other day, Disp: , Rfl:    furosemide (LASIX) 20 MG tablet, Take 20 mg by mouth. One half tablet daily, Disp: , Rfl:    lisinopril (ZESTRIL) 10 MG tablet, Take 1 tablet by mouth daily., Disp: , Rfl:    nitrofurantoin,  macrocrystal-monohydrate, (MACROBID) 100 MG capsule, Take 1 capsule (100 mg total) by mouth every 12 (twelve) hours., Disp: 14 capsule, Rfl: 0   pantoprazole (PROTONIX) 40 MG tablet, Take 1 tablet (40 mg total) by mouth daily., Disp: 90 tablet, Rfl: 3   predniSONE (DELTASONE) 5 MG tablet, TAKE ONE TABLET BY MOUTH ONCE DAILY WITH BREAKFAST., Disp: 30 tablet, Rfl: 0   tamsulosin (FLOMAX) 0.4 MG CAPS capsule, Take 1 capsule (0.4 mg total) by mouth 2 (two) times daily., Disp: 60 capsule, Rfl: 11   traMADol (ULTRAM) 50 MG tablet, Take 1 tablet (50 mg total) by mouth at bedtime as needed., Disp: 30 tablet, Rfl: 0 No current facility-administered medications for this visit.  Facility-Administered Medications Ordered in Other Visits:    octreotide (SANDOSTATIN LAR) 20 MG IM injection, , , ,    Allergies: No Known Allergies  REVIEW OF SYSTEMS:   Review of Systems  Constitutional:  Negative for chills, fatigue and fever.  HENT:   Negative for lump/mass, mouth sores, nosebleeds, sore throat and trouble swallowing.   Eyes:  Negative for eye problems.  Respiratory:  Negative for cough and shortness of breath.   Cardiovascular:  Negative for chest pain, leg swelling and palpitations.  Gastrointestinal:  Negative for abdominal pain, constipation, diarrhea, nausea and vomiting.  Genitourinary:  Negative for bladder incontinence, difficulty urinating, dysuria, frequency, hematuria and nocturia.   Musculoskeletal:  Negative for arthralgias, back pain, flank pain, myalgias and neck pain.  Skin:  Negative for itching and rash.  Neurological:  Negative for dizziness, headaches and numbness.  Hematological:  Does not bruise/bleed easily.  Psychiatric/Behavioral:  Negative for depression, sleep disturbance and  suicidal ideas. The patient is not nervous/anxious.   All other systems reviewed and are negative.    VITALS:   Blood pressure (!) 145/70, pulse 70, temperature 97.8 F (36.6 C), temperature source  Oral, resp. rate 16, weight 166 lb 9.6 oz (75.6 kg), SpO2 97%.  Wt Readings from Last 3 Encounters:  12/31/22 166 lb 9.6 oz (75.6 kg)  09/17/22 172 lb 1.6 oz (78.1 kg)  09/04/22 178 lb (80.7 kg)    Body mass index is 21.98 kg/m.  Performance status (ECOG): 1 - Symptomatic but completely ambulatory  PHYSICAL EXAM:   Physical Exam Vitals and nursing note reviewed. Exam conducted with a chaperone present.  Constitutional:      Appearance: Normal appearance.  Cardiovascular:     Rate and Rhythm: Normal rate and regular rhythm.     Pulses: Normal pulses.     Heart sounds: Normal heart sounds.  Pulmonary:     Effort: Pulmonary effort is normal.     Breath sounds: Normal breath sounds.  Abdominal:     Palpations: Abdomen is soft. There is no hepatomegaly, splenomegaly or mass.     Tenderness: There is no abdominal tenderness.  Musculoskeletal:     Right lower leg: No edema.     Left lower leg: No edema.  Lymphadenopathy:     Cervical: No cervical adenopathy.     Right cervical: No superficial, deep or posterior cervical adenopathy.    Left cervical: No superficial, deep or posterior cervical adenopathy.     Upper Body:     Right upper body: No supraclavicular or axillary adenopathy.     Left upper body: No supraclavicular or axillary adenopathy.  Neurological:     General: No focal deficit present.     Mental Status: He is alert and oriented to person, place, and time.  Psychiatric:        Mood and Affect: Mood normal.        Behavior: Behavior normal.     LABS:      Latest Ref Rng & Units 12/24/2022    1:44 PM 09/10/2022   10:34 AM 06/10/2022    9:50 AM  CBC  WBC 4.0 - 10.5 K/uL 3.2  4.1  4.2   Hemoglobin 13.0 - 17.0 g/dL 40.9  81.1  91.4   Hematocrit 39.0 - 52.0 % 31.7  32.6  33.9   Platelets 150 - 400 K/uL 248  216  223       Latest Ref Rng & Units 12/24/2022    1:44 PM 09/10/2022   10:34 AM 06/10/2022    9:50 AM  CMP  Glucose 70 - 99 mg/dL 782  94  96   BUN 8  - 23 mg/dL 20  20  21    Creatinine 0.61 - 1.24 mg/dL 9.56  2.13  0.86   Sodium 135 - 145 mmol/L 134  134  135   Potassium 3.5 - 5.1 mmol/L 4.0  3.4  3.7   Chloride 98 - 111 mmol/L 104  103  104   CO2 22 - 32 mmol/L 23  24  23    Calcium 8.9 - 10.3 mg/dL 8.9  8.7  9.2   Total Protein 6.5 - 8.1 g/dL 6.6  6.6  6.9   Total Bilirubin <1.2 mg/dL 0.4  1.0  0.9   Alkaline Phos 38 - 126 U/L 48  57  55   AST 15 - 41 U/L 17  15  19    ALT 0 - 44  U/L 10  11  14       Lab Results  Component Value Date   CEA 1.2 12/23/2007   /  CEA  Date Value Ref Range Status  12/23/2007 1.2 0.0 - 5.0 ng/mL Final   No results found for: "PSA1" No results found for: "UXL244" No results found for: "CAN125"  No results found for: "TOTALPROTELP", "ALBUMINELP", "A1GS", "A2GS", "BETS", "BETA2SER", "GAMS", "MSPIKE", "SPEI" Lab Results  Component Value Date   TIBC 223 (L) 12/24/2022   TIBC 229 (L) 04/28/2022   TIBC 217 (L) 01/20/2022   FERRITIN 231 12/24/2022   FERRITIN 318 04/28/2022   FERRITIN 312 01/20/2022   IRONPCTSAT 31 12/24/2022   IRONPCTSAT 50 04/28/2022   IRONPCTSAT 62 (H) 01/20/2022   Lab Results  Component Value Date   LDH 177 10/24/2020   LDH 192 09/10/2020   LDH 198 (H) 08/06/2020     STUDIES:   No results found.

## 2022-12-31 NOTE — Patient Instructions (Signed)
[facility] Discharge Instructions   You were seen and examined today by Dr. Ellin Saba.  He reviewed the results of your lab work. Your PSA has gone up.   For now, continue the Nubeqa as prescribed. Finish the bottle you have and do not reorder.   We will call you with the special tests we sent to see if there are other pill options for treatment.      Thank you for choosing [facility] at [facility] to provide your oncology and hematology care.  To afford each patient quality time with our provider, please arrive at least 15 minutes before your scheduled appointment time.   If you have a lab appointment with the Cancer Center please come in thru the Main Entrance and check in at the main information desk.  You need to re-schedule your appointment should you arrive 10 or more minutes late.  We strive to give you quality time with our providers, and arriving late affects you and other patients whose appointments are after yours.  Also, if you no show three or more times for appointments you may be dismissed from the clinic at the providers discretion.     Again, thank you for choosing [facility].  Our hope is that these requests will decrease the amount of time that you wait before being seen by our physicians.       _____________________________________________________________  Should you have questions after your visit to [facility], please contact our office at [phone] and follow the prompts.  Our office hours are 8:00 a.m. and 4:30 p.m. Monday - Friday.  Please note that voicemails left after 4:00 p.m. may not be returned until the following business day.  We are closed weekends and major holidays.  You do have access to a nurse 24-7, just call the main number to the clinic [phone] and do not press any options, hold on the line and a nurse will answer the phone.    For prescription refill  requests, have your pharmacy contact our office and allow 72 hours.    Due to Covid, you will need to wear a mask upon entering the hospital. If you do not have a mask, a mask will be given to you at the Main Entrance upon arrival. For doctor visits, patients may have 1 support person age 59 or older with them. For treatment visits, patients can not have anyone with them due to social distancing guidelines and our immunocompromised population.

## 2023-01-07 ENCOUNTER — Other Ambulatory Visit: Payer: Self-pay

## 2023-01-10 ENCOUNTER — Other Ambulatory Visit: Payer: Self-pay | Admitting: Hematology

## 2023-01-11 ENCOUNTER — Encounter: Payer: Self-pay | Admitting: Licensed Clinical Social Worker

## 2023-01-11 ENCOUNTER — Encounter (HOSPITAL_COMMUNITY): Payer: Self-pay | Admitting: Hematology

## 2023-01-11 DIAGNOSIS — Z1379 Encounter for other screening for genetic and chromosomal anomalies: Secondary | ICD-10-CM | POA: Insufficient documentation

## 2023-01-12 ENCOUNTER — Other Ambulatory Visit (HOSPITAL_COMMUNITY): Payer: Self-pay

## 2023-01-12 NOTE — Progress Notes (Signed)
Spoke with patient's daughter, who reports his specialty medication was discontinued, confirmed in office visit notes that patient is transitioning to supportive care.  Disenrolled from Specialty Pharmacy services.

## 2023-01-13 ENCOUNTER — Other Ambulatory Visit: Payer: Medicare Other

## 2023-01-20 ENCOUNTER — Ambulatory Visit: Payer: Medicare Other | Admitting: Urology

## 2023-01-25 ENCOUNTER — Encounter (HOSPITAL_COMMUNITY): Payer: Self-pay | Admitting: Hematology

## 2023-02-22 ENCOUNTER — Ambulatory Visit (INDEPENDENT_AMBULATORY_CARE_PROVIDER_SITE_OTHER): Payer: No Typology Code available for payment source | Admitting: Gastroenterology

## 2023-03-01 ENCOUNTER — Encounter: Payer: Self-pay | Admitting: Surgical

## 2023-03-01 ENCOUNTER — Ambulatory Visit (INDEPENDENT_AMBULATORY_CARE_PROVIDER_SITE_OTHER): Payer: Medicare Other | Admitting: Surgical

## 2023-03-01 ENCOUNTER — Other Ambulatory Visit: Payer: Self-pay

## 2023-03-01 DIAGNOSIS — M19012 Primary osteoarthritis, left shoulder: Secondary | ICD-10-CM | POA: Diagnosis not present

## 2023-03-01 MED ORDER — LIDOCAINE HCL 1 % IJ SOLN
5.0000 mL | INTRAMUSCULAR | Status: AC | PRN
  Administered 2023-03-01: 5 mL

## 2023-03-01 MED ORDER — METHYLPREDNISOLONE ACETATE 40 MG/ML IJ SUSP
40.0000 mg | INTRAMUSCULAR | Status: AC | PRN
  Administered 2023-03-01: 40 mg via INTRA_ARTICULAR

## 2023-03-01 MED ORDER — BUPIVACAINE HCL 0.25 % IJ SOLN
9.0000 mL | INTRAMUSCULAR | Status: AC | PRN
  Administered 2023-03-01: 9 mL via INTRA_ARTICULAR

## 2023-03-01 NOTE — Progress Notes (Signed)
   Procedure Note  Patient: Derrick Long             Date of Birth: 1926/02/26           MRN: [id number]             Visit Date: 03/01/2023  Procedures: Visit Diagnoses:  1. Primary osteoarthritis, left shoulder     Large Joint Inj: L glenohumeral on 03/01/2023 3:15 PM Details: 22 G 3.5 in needle, ultrasound-guided posterior approach Medications: 5 mL lidocaine  1 %; 9 mL bupivacaine  0.25 %; 40 mg methylPREDNISolone  acetate 40 MG/ML Outcome: tolerated well, no immediate complications  Patient returns for left shoulder injection.  Has had good relief from injections in the past last seen about 4 months.  Currently his right shoulder that was also injected last time and is doing very well and he has minimal discomfort with this shoulder.  He would like to only inject the left shoulder today.  Tolerated procedure well and he will let us  know if the right shoulder starts bothering him more and we can inject this at a later date if needed.  Follow-up as needed with next injection in 3 to 4 months at earliest. Procedure, treatment alternatives, risks and benefits explained, specific risks discussed. Consent was given by the patient. Patient was prepped and draped in the usual sterile fashion.

## 2023-03-12 ENCOUNTER — Other Ambulatory Visit: Payer: Self-pay | Admitting: Hematology

## 2023-03-16 ENCOUNTER — Other Ambulatory Visit: Payer: Self-pay

## 2023-03-16 ENCOUNTER — Encounter (HOSPITAL_COMMUNITY): Payer: Self-pay | Admitting: Hematology

## 2023-03-16 MED ORDER — PREDNISONE 5 MG PO TABS: 5.0000 mg | ORAL_TABLET | Freq: Every day | ORAL | 0 refills | Status: DC

## 2023-03-22 ENCOUNTER — Inpatient Hospital Stay: Payer: Medicare Other | Attending: Hematology

## 2023-03-22 VITALS — BP 129/67 | Temp 97.6°F | Resp 16

## 2023-03-22 DIAGNOSIS — C61 Malignant neoplasm of prostate: Secondary | ICD-10-CM | POA: Insufficient documentation

## 2023-03-22 DIAGNOSIS — Z5111 Encounter for antineoplastic chemotherapy: Secondary | ICD-10-CM | POA: Diagnosis not present

## 2023-03-22 MED ORDER — LEUPROLIDE ACETATE (6 MONTH) 45 MG ~~LOC~~ KIT
45.0000 mg | PACK | Freq: Once | SUBCUTANEOUS | Status: AC
Start: 2023-03-22 — End: 2023-03-22
  Administered 2023-03-22: 45 mg via SUBCUTANEOUS
  Filled 2023-03-22: qty 45

## 2023-03-22 NOTE — Progress Notes (Signed)
 Patient tolerated injection with no complaints voiced.  Site clean and dry with no bruising or swelling noted at site.  See MAR for details.  Band aid applied.  Patient stable during and after injection.  Vss with discharge and left in satisfactory condition with no s/s of distress noted.

## 2023-03-22 NOTE — Patient Instructions (Signed)
 [facility] [place] - A DEPT OF [facility]  Discharge Instructions: Thank you for choosing [facility] to provide your oncology and hematology care.  If you have a lab appointment with the Cancer Center - please note that after April 8th, 2024, all labs will be drawn in the cancer center.  You do not have to check in or register with the main entrance as you have in the past but will complete your check-in in the cancer center.  Wear comfortable clothing and clothing appropriate for easy access to any Portacath or PICC line.   We strive to give you quality time with your provider. You may need to reschedule your appointment if you arrive late (15 or more minutes).  Arriving late affects you and other patients whose appointments are after yours.  Also, if you miss three or more appointments without notifying the office, you may be dismissed from the clinic at the provider's discretion.      For prescription refill requests, have your pharmacy contact our office and allow 72 hours for refills to be completed.      To help prevent nausea and vomiting after your treatment, we encourage you to take your nausea medication as directed.  BELOW ARE SYMPTOMS THAT SHOULD BE REPORTED IMMEDIATELY: *FEVER GREATER THAN 100.4 F (38 C) OR HIGHER *CHILLS OR SWEATING *NAUSEA AND VOMITING THAT IS NOT CONTROLLED WITH YOUR NAUSEA MEDICATION *UNUSUAL SHORTNESS OF BREATH *UNUSUAL BRUISING OR BLEEDING *URINARY PROBLEMS (pain or burning when urinating, or frequent urination) *BOWEL PROBLEMS (unusual diarrhea, constipation, pain near the anus) TENDERNESS IN MOUTH AND THROAT WITH OR WITHOUT PRESENCE OF ULCERS (sore throat, sores in mouth, or a toothache) UNUSUAL RASH, SWELLING OR PAIN  UNUSUAL VAGINAL DISCHARGE OR ITCHING   Items with * indicate a potential emergency and should be followed up as soon as possible or go to the Emergency Department if any problems should  occur.  Please show the CHEMOTHERAPY ALERT CARD or IMMUNOTHERAPY ALERT CARD at check-in to the Emergency Department and triage nurse.  Should you have questions after your visit or need to cancel or reschedule your appointment, please contact [facility]  - A DEPT OF Eligh[facility]ideg[facility] [phone]  and follow the prompts.  Office hours are 8:00 a.m. to 4:30 p.m. Monday - Friday. Please note that voicemails left after 4:00 p.m. may not be returned until the following business day.  We are closed weekends and major holidays. You have access to a nurse at all times for urgent questions. Please call the main number to the clinic [phone] and follow the prompts.  For any non-urgent questions, you may also contact your provider using MyChart. We now offer e-Visits for anyone 71 and older to request care online for non-urgent symptoms. For details visit [email or web address].   Also download the MyChart app! Go to the app store, search "MyChart", open the app, select [place], and log in with your MyChart username and password.

## 2023-04-10 IMAGING — CT CT ABD-PELV W/ CM
2 of 5 series · 14 of 46 positions shown, 16 images · IV contrast (agent unspecified)
Comparison: CT abdomen pelvis without contrast 02/19/2021, CT
abdomen and pelvis with contrast 10/25/2020

CLINICAL DATA: Acute nonlocalized abdominal pain. Black stools and
weakness. Abdominal pain for the last several days.

EXAM:
CT ABDOMEN AND PELVIS WITH CONTRAST
TECHNIQUE: Multidetector CT imaging of the abdomen and pelvis was performed
using the standard protocol following bolus administration of
intravenous contrast.

[Series 2: axial st · axial · 0.81mm/px · z∈[+841,+1296]mm · 11 of 105 slices shown, 13 images]
[im 7/105  soft-tissue]
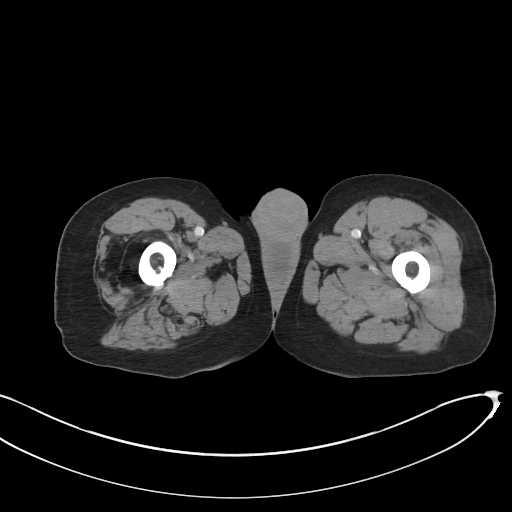
[im 7/105  bone]
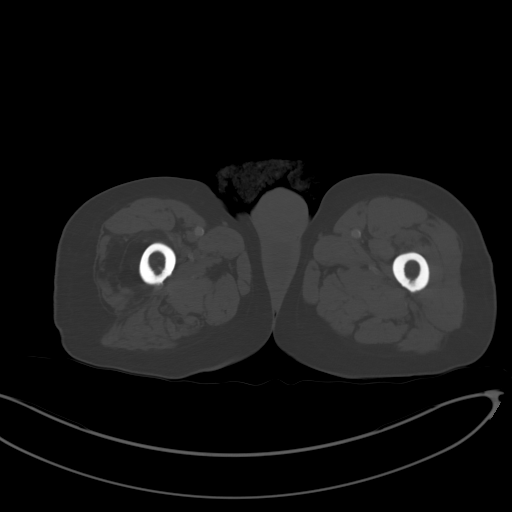
[im 20/105  soft-tissue]
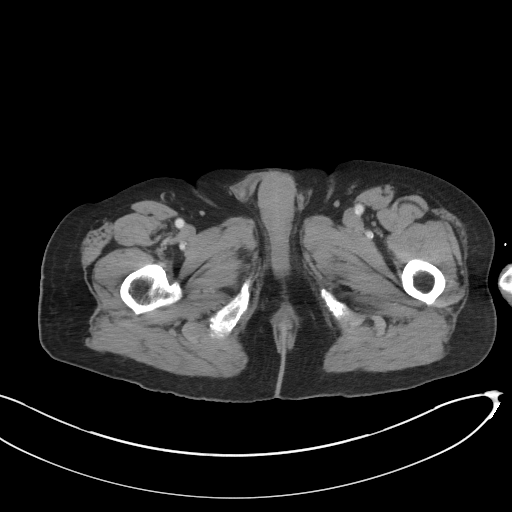
[im 27/105  soft-tissue]
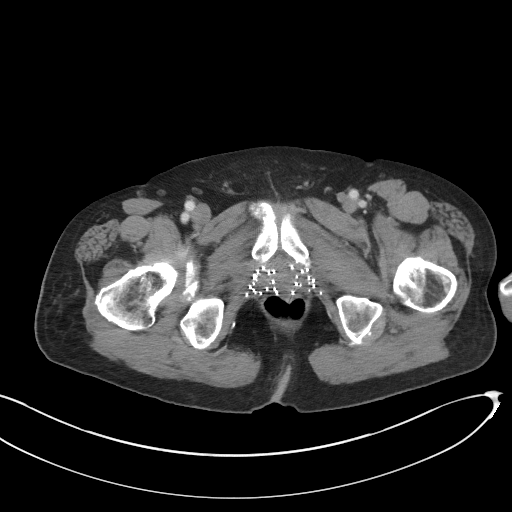
[im 33/105  soft-tissue]
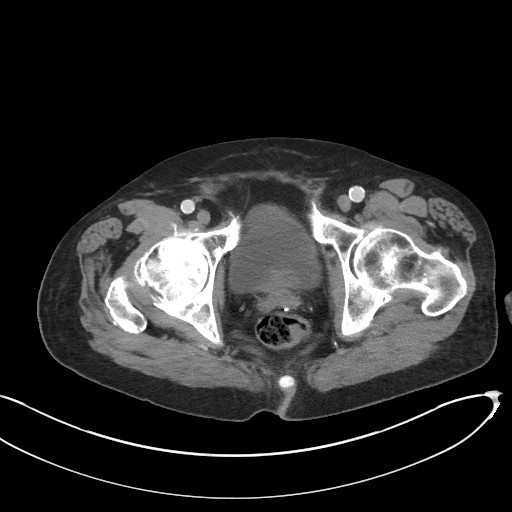
[im 46/105  soft-tissue]
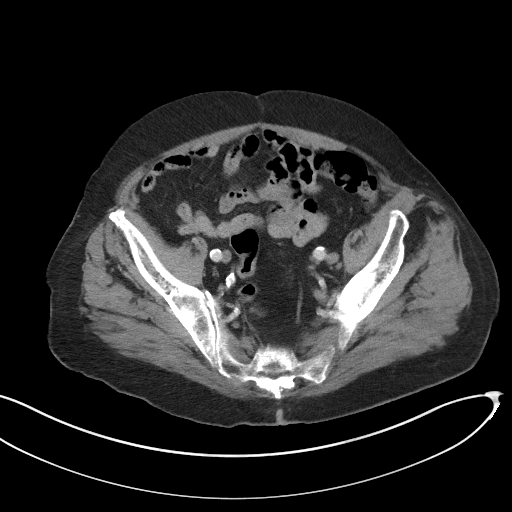
[im 53/105  soft-tissue]
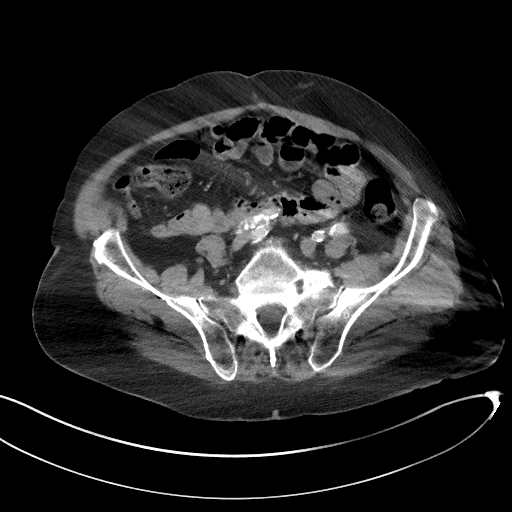
[im 59/105  soft-tissue]
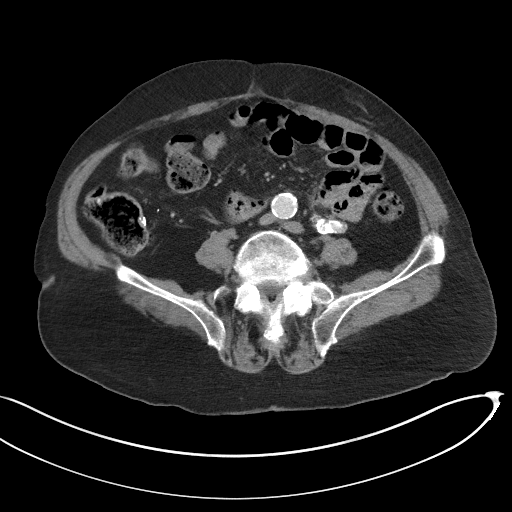
[im 72/105  soft-tissue]
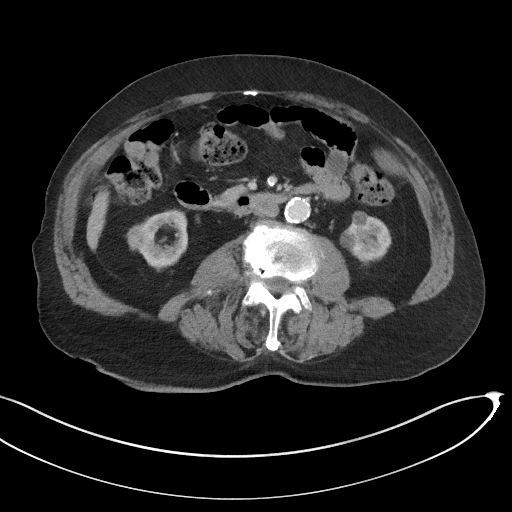
[im 79/105  soft-tissue]
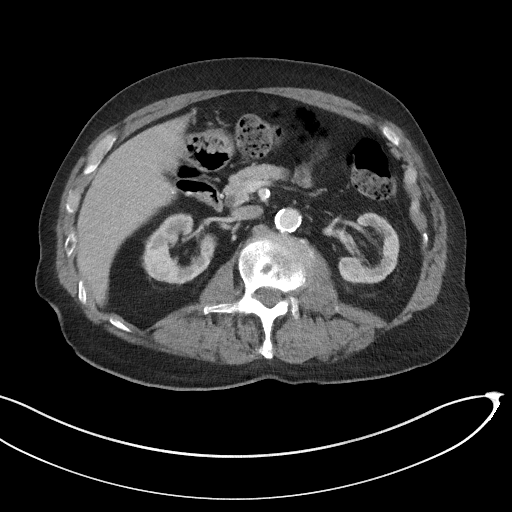
[im 79/105  bone]
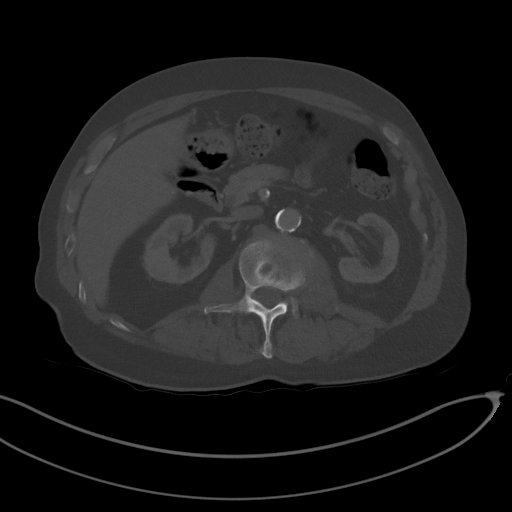
[im 85/105  soft-tissue]
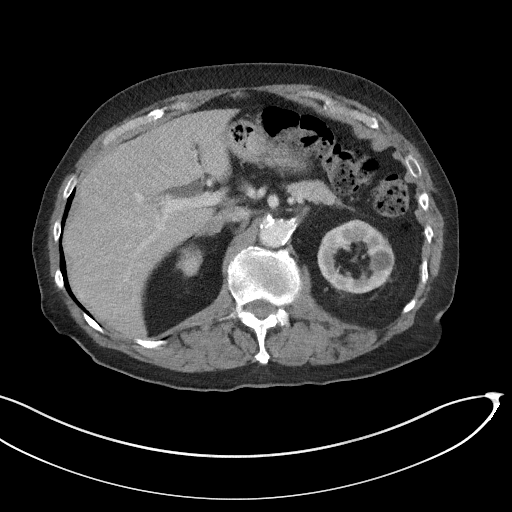
[im 98/105  soft-tissue]
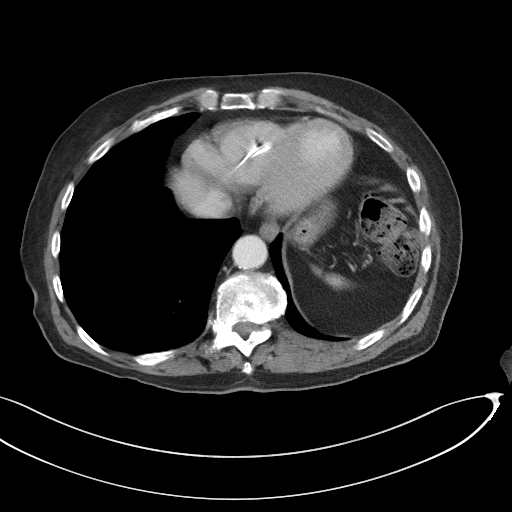

[Series 5: coronal st · coronal · 0.80mm/px · 3 of 102 slices shown]
[im 34/102  soft-tissue]
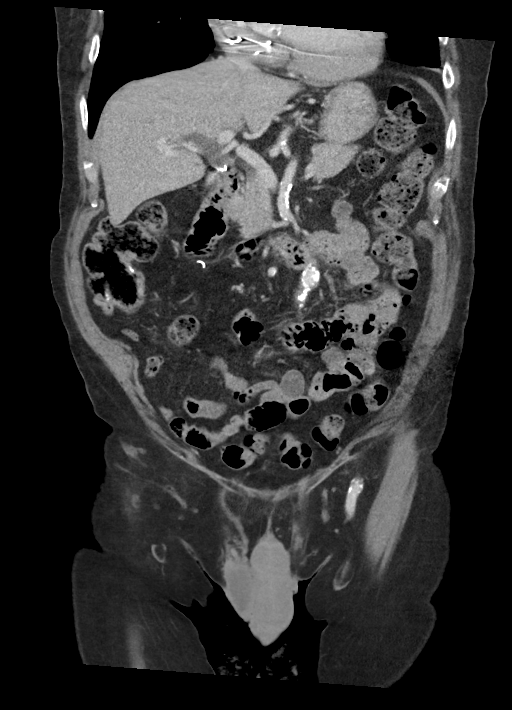
[im 45/102  soft-tissue]
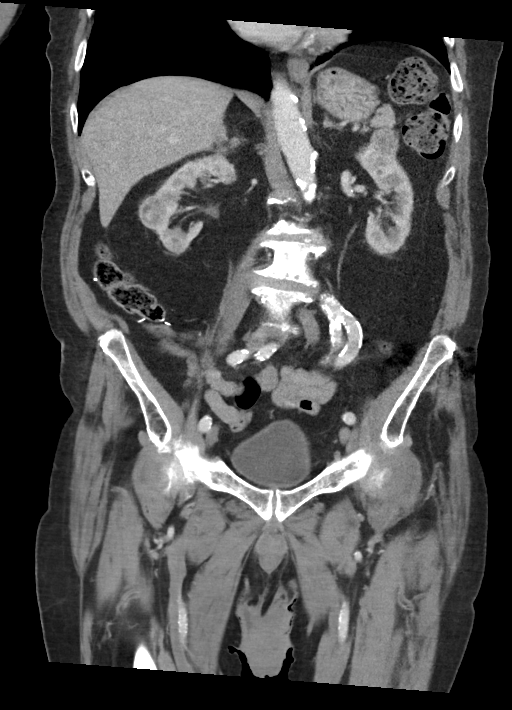
[im 57/102  soft-tissue]
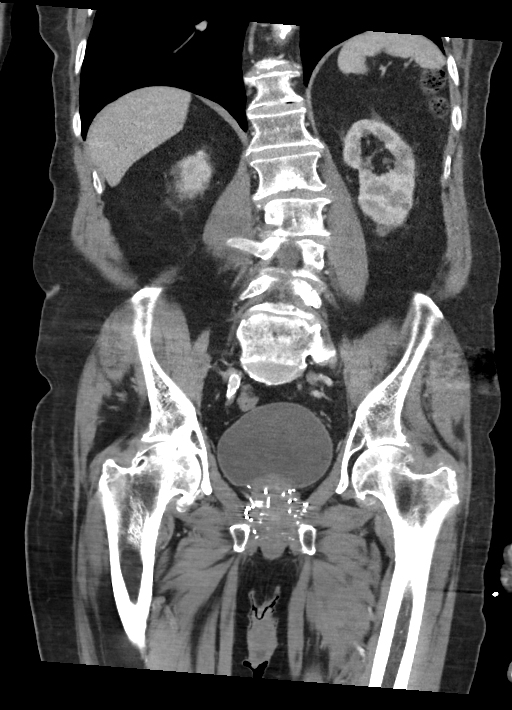

[14 of 46 positions shown; findings below may reference images not displayed]

RADIATION DOSE REDUCTION: This exam was performed according to the
departmental dose-optimization program which includes automated
exposure control, adjustment of the mA and/or kV according to
patient size and/or use of iterative reconstruction technique.

CONTRAST:  75mL OMNIPAQUE IOHEXOL 300 MG/ML  SOLN
FINDINGS: Lower chest: Minimal curvilinear scarring versus subsegmental
atelectasis within the inferior aspect of the middle lobe unchanged.
Mild subsegmental atelectasis within the posterior left lower lobe.
Heart size is mildly enlarged. Cardiac pacer wires are noted.
Unchanged 4 mm anterolateral right lower lobe nodule compared to
02/19/2021.

Hepatobiliary: Smooth liver contours. No focal liver mass is
identified. Status post cholecystectomy. Mild central intrahepatic
biliary ductal dilatation appears unchanged likely postsurgical. The
common bile duct measures up to approximately 7 mm, unchanged from
prior and within normal limits given post cholecystectomy and
patient age.

Pancreas: No mass or inflammatory fat stranding. No pancreatic
ductal dilatation is seen.

Spleen: Normal in size without focal abnormality.

Adrenals/Urinary Tract: Unchanged right adrenal nodule measuring up
to approximately 11 x 19 mm (axial image 21). Normal left adrenal
gland. There are numerous bilateral renal lesions. Some of these
demonstrate fluid density suggesting cysts. There is an apparent
partially enhancing mass extending exophytically from the lower pole
of the right kidney measuring up to 2.3 x 1.9 by 2.7 cm (axial image
32 and coronal image 45) concerning for enhancing mass of renal cell
carcinoma. This is a similar size 04/01/2021 noncontrast CT when
this measured 17 Hounsfield units centrally. Within the far inferior
aspect of the lower pole there is an exophytic 2.0 cm centrally low
dense lesion and peripherally enhancing lesion that may represent a
cyst versus neoplasm. This measures 33 Hounsfield units centrally.
Within the upper pole of the left kidney there is a partially
enhancing mass measuring 2.2 x 2.6 x 2.3 cm (axial image 18 and
coronal image 46). This is also suspicious for a solid enhancing
mass of renal cell carcinoma. This is not significant changed in
size from 02/19/2021 noncontrast CT when the central aspect measured
17 Hounsfield units.

No focal urinary bladder wall thickening.

Stomach/Bowel: A diverticulum is seen within the sigmoid colon
without inflammatory change. Moderate to high-grade stool is seen
throughout the colon. Surgical suture is seen in the region of the
proximal ascending colon, an ileocolic anastomosis. No dilated loops
of bowel to indicate bowel obstruction.

Vascular/Lymphatic: No abdominal aortic aneurysm. Moderate to
high-grade atherosclerotic calcifications. The right common iliac
artery is distended up to 2.0 cm in the left common iliac artery is
distended up to 1.8 cm. No mesenteric, retroperitoneal, or pelvic
lymphadenopathy.

Reproductive: The prostate gland is again enlarged and contains
brachytherapy seeds. The seminal vesicles are grossly unremarkable.

Other: Small fat containing umbilical hernia.No abdominopelvic
ascites. No pneumoperitoneum.

Musculoskeletal: Mild levocurvature of the mid to upper lumbar
spine. Severe right L2-3 disc space narrowing and endplate
degenerative changes.
IMPRESSION: Compared to 02/19/2021:

1. No CT explanation for the patient's abdominal pain.
2. Bilateral enhancing renal masses again suspicious for renal cell
carcinoma.
3. Postsurgical changes of prior right hemicolectomy and ileocolic
anastomosis. No bowel obstruction.
4. Additional chronic findings as above.

## 2023-05-07 ENCOUNTER — Ambulatory Visit: Payer: Medicare Other | Admitting: Urology

## 2023-06-24 DIAGNOSIS — H43393 Other vitreous opacities, bilateral: Secondary | ICD-10-CM | POA: Diagnosis not present

## 2023-07-26 ENCOUNTER — Ambulatory Visit (INDEPENDENT_AMBULATORY_CARE_PROVIDER_SITE_OTHER): Payer: Medicare Other | Admitting: Gastroenterology

## 2023-07-26 ENCOUNTER — Encounter (INDEPENDENT_AMBULATORY_CARE_PROVIDER_SITE_OTHER): Payer: Self-pay | Admitting: Gastroenterology

## 2023-07-26 ENCOUNTER — Encounter (HOSPITAL_COMMUNITY): Payer: Self-pay | Admitting: Hematology

## 2023-07-26 VITALS — BP 156/65 | HR 60 | Temp 97.5°F | Ht 73.0 in | Wt 170.1 lb

## 2023-07-26 DIAGNOSIS — D509 Iron deficiency anemia, unspecified: Secondary | ICD-10-CM

## 2023-07-26 DIAGNOSIS — D500 Iron deficiency anemia secondary to blood loss (chronic): Secondary | ICD-10-CM

## 2023-07-26 NOTE — Progress Notes (Signed)
 Referring Provider: [facility] Primary Care Physician:  Nadine Aus, MD Primary GI Physician: Dr. Sammi Crick   Chief Complaint  Patient presents with   Iron Deficiency Anemia    Pt arrives for follow up on IDA. Pt states no issues/concerns at this time.    HPI:   LEKENDRICK ALPERN is a 89 y.o. male with past medical history of GERD, anemia, BPH, gout, HTN, Hyperlipemia, a fib on Xarelto , metastatic prostate cancer, colon cancer.   Patient presenting today for:  Follow up of IDA  Last seen June 2024, at that time taking iron every other day. Some darker stools, no rectal bleeding.   Recommended continuing protonix  40mg  daily, continue PO iron every other day.   Last labs 12/2022: with hgb 10, ferritin 231, iron 70, TIBC 223, sat 31, following with hematology   Present:  Doing well today. Has not seen Dr Linnell Richardson since November. No planned follow ups with hematology per patient's daughter. He denies any rectal bleeding, melena, constipation, diarrhea, abdominal pain.denies sob, dizziness, fatigue.  Appetite is good. Still taking iron pill every other day. Taking protonix  40mg  BID, unsure when he increased to BID dosing.  Denies any GERD symptoms. [facility] [place] [facility], reports no abnormalities mentioned.    Small bowel endoscopy: 11/2021 - 1 cm hiatal hernia. - Dark pigementation mucosa in the cardia. Biopsied.(mild reactive reparative changes at the squamocolumnar junction but no other abnormalities)  - Erosive gastropathy with no stigmata of recent bleeding. - A single non-bleeding angiodysplastic lesion in the stomach. Treated with argon plasma coagulation (APC). - Normal examined duodenum.- Two non-bleeding angioectasias in the jejunum. Treated with argon plasma coagulation(APC). Last Colonoscopy:several years [facility]  Last Endoscopy:08/02/21-Normal esophagus. Inflamed appearing gastric mucosa. 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   Filed Weights   07/26/23 1342  Weight: 170 lb 1.6 oz (77.2 kg)     Past Medical History:  Diagnosis Date   Acid reflux    Acute colitis 09/16/2019   AKI (acute kidney injury) (HCC) 07/31/2012   Anemia    BENIGN PROSTATIC HYPERTROPHY, WITH OBSTRUCTION 12/18/2005   Qualifier: Diagnosis of  By: Renold Cashing     Chest pain 07/31/2012   Colitis 09/16/2019   Colon cancer (HCC)    per patient, colon cancer more than 10 years ago s/p right hemicolectomy   Elevated bilirubin    Elevated LFTs    Ex-cigarette smoker    Gout    Hyperlipemia    Hypertension    Hyponatremia 08/01/2021   Intractable abdominal pain 02/03/2021   LATERAL MENISCUS TEAR, RIGHT 04/19/2006   Qualifier: Diagnosis of  By: Nana Avers MD, Cornelius     Leukocytosis, unspecified 07/31/2012   Paroxysmal atrial fibrillation (HCC) 2014   Presence of permanent cardiac pacemaker    Prostate cancer (HCC) 08/08/2013   SEEDS 20 YEARS AGO   Prostate disease    RENAL CALCULUS 12/18/2005   Qualifier: Diagnosis of  By: Renold Cashing     RUQ pain    UTI (urinary tract infection) 03/26/2021    Past Surgical History:  Procedure Laterality Date   BIOPSY  02/05/2021   Procedure: BIOPSY;  Surgeon: Urban Garden, MD;  [facility];  Service: Gastroenterology;;   BIOPSY  08/02/2021   Procedure: BIOPSY;  Surgeon:  Rourk, Windsor Hatcher, MD;  [facility]: [facility];  Service: Endoscopy;;   COLON SURGERY     Right hemicolectomy in the setting of colon cancer, per patient   CYSTOSCOPY WITH INSERTION OF UROLIFT N/A 05/19/2021   Procedure: CYSTOSCOPY WITH INSERTION OF UROLIFT;  Surgeon: Marco Severs, MD;  Location: [facility];  Service: Urology;  Laterality: N/A;   ENTEROSCOPY N/A 11/21/2021   Procedure: PUSH ENTEROSCOPY;  Surgeon:  Urban Garden, MD;  [facility];  Service: Gastroenterology;  Laterality: N/A;   ESOPHAGOGASTRODUODENOSCOPY (EGD) WITH PROPOFOL  N/A 02/05/2021   Procedure: ESOPHAGOGASTRODUODENOSCOPY (EGD) WITH PROPOFOL ;  Surgeon: Urban Garden, MD;  [facility];  Service: Gastroenterology;  Laterality: N/A;   ESOPHAGOGASTRODUODENOSCOPY (EGD) WITH PROPOFOL  N/A 08/02/2021   Procedure: ESOPHAGOGASTRODUODENOSCOPY (EGD) WITH PROPOFOL ;  Surgeon: Suzette Espy, MD;  [facility];  Service: Endoscopy;  Laterality: N/A;   ESOPHAGOGASTRODUODENOSCOPY (EGD) WITH PROPOFOL  N/A 11/21/2021   Procedure: ESOPHAGOGASTRODUODENOSCOPY (EGD) WITH PROPOFOL ;  Surgeon: Urban Garden, MD;  [facility];  Service: Gastroenterology;  Laterality: N/A;  1200  ASA 3   HOT HEMOSTASIS  08/02/2021   Procedure: HOT HEMOSTASIS (ARGON PLASMA COAGULATION/BICAP);  Surgeon: Suzette Espy, MD;  [facility];  Service: Endoscopy;;   HOT HEMOSTASIS  11/21/2021   Procedure: HOT HEMOSTASIS (ARGON PLASMA COAGULATION/BICAP);  Surgeon: Umberto Ganong, Bearl Limes, MD;  [facility];  Service: Gastroenterology;;   LEFT HEART CATHETERIZATION WITH CORONARY ANGIOGRAM N/A 07/21/2012   Procedure: LEFT HEART CATHETERIZATION WITH CORONARY ANGIOGRAM;  Surgeon: Millicent Ally, MD;  Location: [facility] CATH LAB;  Service: Cardiovascular;  Laterality: N/A;   PACEMAKER INSERTION     PERMANENT PACEMAKER INSERTION N/A 07/22/2012   Procedure: PERMANENT PACEMAKER INSERTION;  Surgeon: Luana Rumple, MD;  Location: [facility] CATH [facility];  Service: Cardiovascular;  Laterality: N/A;   PROSTATE SURGERY      Current Outpatient Medications  Medication Sig Dispense Refill   acetaminophen  (TYLENOL ) 650 MG CR tablet Take 1,300 mg by mouth every 8 (eight) hours as needed for pain.     amLODipine  (NORVASC ) 10 MG tablet Take 1 tablet by mouth daily.     carvedilol  (COREG ) 3.125 MG tablet Take 3.125 mg  by mouth 2 (two) times daily with a meal.     diclofenac  sodium (VOLTAREN ) 1 % GEL Apply 2 g topically 4 (four) times daily. 100 g 0   Ensure (ENSURE) Take 237 mLs by mouth daily as needed (nutrition). Vanilla or chocolate     ferrous sulfate  324 MG TBEC Take 324 mg by mouth. Takes one tablet every other day     furosemide  (LASIX ) 20 MG tablet Take 20 mg by mouth. One half tablet daily     lisinopril (ZESTRIL) 10 MG tablet Take 1 tablet by mouth daily.     nitrofurantoin , macrocrystal-monohydrate, (MACROBID ) 100 MG capsule Take 1 capsule (100 mg total) by mouth every 12 (twelve) hours. 14 capsule 0   pantoprazole  (PROTONIX ) 40 MG tablet Take 1 tablet (40 mg total) by mouth daily. 90 tablet 3   predniSONE  (DELTASONE ) 5 MG tablet TAKE ONE TABLET BY MOUTH ONCE DAILY WITH BREAKFAST. 30 tablet 0   predniSONE  (DELTASONE ) 5 MG tablet Take 1 tablet (5 mg total) by mouth daily with breakfast. 30 tablet 0   tamsulosin  (FLOMAX ) 0.4 MG CAPS capsule Take 1 capsule (0.4 mg total) by mouth 2 (two) times daily. 60 capsule 11   traMADol  (ULTRAM ) 50 MG tablet Take 1  tablet (50 mg total) by mouth at bedtime as needed. 30 tablet 0   No current facility-administered medications for this visit.   Facility-Administered Medications Ordered in Other Visits  Medication Dose Route Frequency Provider Last Rate Last Admin   octreotide  (SANDOSTATIN  LAR) 20 MG IM injection             Allergies as of 07/26/2023   (No Known Allergies)    Social History   Socioeconomic History   Marital status: Married    Spouse name: Not on file   Number of children: Not on file   Years of education: Not on file   Highest education level: Not on file  Occupational History   Not on file  Tobacco Use   Smoking status: Former    Current packs/day: 0.00    Types: Cigarettes    Quit date: 07/20/2002    Years since quitting: 21.0    Passive exposure: Past   Smokeless tobacco: Former    Types: Engineer, drilling   Vaping status:  Never Used  Substance and Sexual Activity   Alcohol use: No   Drug use: No   Sexual activity: Not Currently  Other Topics Concern   Not on file  Social History Narrative   Not on file   Social Drivers of Health   Financial Resource Strain: Low Risk  (05/16/2020)   Overall Financial Resource Strain (CARDIA)    Difficulty of Paying Living Expenses: Not hard at all  Food Insecurity: No Food Insecurity (05/16/2020)   Hunger Vital Sign    Worried About Running Out of Food in the Last Year: Never true    Ran Out of Food in the Last Year: Never true  Transportation Needs: No Transportation Needs (05/16/2020)   PRAPARE - Administrator, Civil Service (Medical): No    Lack of Transportation (Non-Medical): No  Physical Activity: Insufficiently Active (05/16/2020)   Exercise Vital Sign    Days of Exercise per Week: 3 days    Minutes of Exercise per Session: 30 min  Stress: No Stress Concern Present (05/16/2020)   [employer] of Occupational Health - Occupational Stress Questionnaire    Feeling of Stress : Not at all  Social Connections: Moderately Integrated (05/16/2020)   Social Connection and Isolation Panel [NHANES]    Frequency of Communication with Friends and Family: More than three times a week    Frequency of Social Gatherings with Friends and Family: More than three times a week    Attends Religious Services: More than 4 times per year    Active Member of Clubs or Organizations: No    Attends Banker Meetings: 1 to 4 times per year    Marital Status: Widowed    Review of systems General: negative for malaise, night sweats, fever, chills, weight loss Neck: Negative for lumps, goiter, pain and significant neck swelling Resp: Negative for cough, wheezing, dyspnea at rest CV: Negative for chest pain, leg swelling, palpitations, orthopnea GI: denies melena, hematochezia, nausea, vomiting, diarrhea, constipation, dysphagia, odyonophagia, early satiety or  unintentional weight loss.  The remainder of the review of systems is noncontributory.  Physical Exam: BP (!) 168/71   Pulse 60   Temp (!) 97.5 F (36.4 C)   Ht 6\' 1"  (1.854 m)   Wt 170 lb 1.6 oz (77.2 kg)   BMI 22.44 kg/m  General:   Alert and oriented. No distress noted. Pleasant and cooperative.  Head:  Normocephalic and atraumatic. Eyes:  Conjuctiva clear without scleral icterus. Mouth:  Oral mucosa pink and moist. Good dentition. No lesions. Heart: Normal rate and rhythm, s1 and s2 heart sounds present.  Lungs: Clear lung sounds in all lobes. Respirations equal and unlabored. Abdomen:  +BS, soft, non-tender and non-distended. No rebound or guarding. No HSM or masses noted. Neurologic:  Alert and  oriented x4 Psych:  Alert and cooperative. Normal mood and affect.  Invalid input(s): "6 MONTHS"   ASSESSMENT: FOX SALMINEN is a 89 y.o. male presenting today for IDA  History of IDA, Small bowel endoscopy in October 2023 with multiple angiectasias/angiodysplastic lesions s/p APC, melena and IDA thought secondary to these lesions/combination of age/presence of malignancy. previously following with hematology/oncology, though last visit was in November and daughter reports no planned follow ups. He continues to take iron Every other day, denies rectal bleeding, melena, fatigue, dizziness, sob, changes in appetite. Iron levels were normal in November, though no more recent labs in chart review. He had some blood work done at the [place] a few months ago which I will obtain for review. Notably he is taking PPI BID, though had been recommended to continue on daily dose of PPI, it is unclear when this was increased or why. I discussed trying to step back down to once daily dosing of PPI, he can let me know if he does not do well on daily dosing and I will send refill of protonix  for BID.    PLAN:  -continue iron every other day -obtain labs from [place] -take protonix  40mg  once daily, pt to let  me know if he has refractory symptoms on daily dosing  All questions were answered, patient verbalized understanding and is in agreement with plan as outlined above.   Follow Up: 1 year   Graylon Amory L. Blessen Kimbrough, MSN, APRN, AGNP-C Adult-Gerontology Nurse Practitioner [facility]

## 2023-07-26 NOTE — Patient Instructions (Signed)
 Continue with iron every other day I will obtain labs from the [place] for review and let you know if we need to do any further labs Try taking protonix  40mg  once daily, if you find you have any reflux symptoms with once daily dosing, we can resume twice daily dosing, please update me in a week or two so that I can refill this for you with the dosage that works best for you  Follow up 1 year  It was a pleasure to see you today. I want to create trusting relationships with patients and provide genuine, compassionate, and quality care. I truly value your feedback! please be on the lookout for a survey regarding your visit with me today. I appreciate your input about our visit and your time in completing this!    Derrick Kingsbury L. Melissaann Dizdarevic, MSN, APRN, AGNP-C Adult-Gerontology Nurse Practitioner [facility]

## 2023-07-30 ENCOUNTER — Other Ambulatory Visit: Payer: Self-pay | Admitting: Hematology

## 2023-08-01 ENCOUNTER — Other Ambulatory Visit (INDEPENDENT_AMBULATORY_CARE_PROVIDER_SITE_OTHER): Payer: Self-pay | Admitting: Gastroenterology

## 2023-08-02 ENCOUNTER — Encounter (HOSPITAL_COMMUNITY): Payer: Self-pay | Admitting: Hematology

## 2023-09-04 ENCOUNTER — Other Ambulatory Visit: Payer: Self-pay

## 2023-09-04 ENCOUNTER — Encounter (HOSPITAL_COMMUNITY): Payer: Self-pay

## 2023-09-04 ENCOUNTER — Emergency Department (HOSPITAL_COMMUNITY)

## 2023-09-04 ENCOUNTER — Emergency Department (HOSPITAL_COMMUNITY)
Admission: EM | Admit: 2023-09-04 | Discharge: 2023-09-04 | Disposition: A | Discharge status: Home / Self Care | Attending: Emergency Medicine | Admitting: Emergency Medicine

## 2023-09-04 DIAGNOSIS — M436 Torticollis: Secondary | ICD-10-CM | POA: Diagnosis not present

## 2023-09-04 DIAGNOSIS — M542 Cervicalgia: Secondary | ICD-10-CM | POA: Diagnosis present

## 2023-09-04 DIAGNOSIS — Z8546 Personal history of malignant neoplasm of prostate: Secondary | ICD-10-CM | POA: Diagnosis not present

## 2023-09-04 DIAGNOSIS — Z950 Presence of cardiac pacemaker: Secondary | ICD-10-CM | POA: Diagnosis not present

## 2023-09-04 DIAGNOSIS — I10 Essential (primary) hypertension: Secondary | ICD-10-CM | POA: Insufficient documentation

## 2023-09-04 DIAGNOSIS — Z79899 Other long term (current) drug therapy: Secondary | ICD-10-CM | POA: Diagnosis not present

## 2023-09-04 LAB — CBC WITH DIFFERENTIAL/PLATELET
Abs Immature Granulocytes: 0.04 K/uL (ref 0.00–0.07)
Basophils Absolute: 0 K/uL (ref 0.0–0.1)
Basophils Relative: 0 %
Eosinophils Absolute: 0.1 K/uL (ref 0.0–0.5)
Eosinophils Relative: 1 %
HCT: 30.6 % — ABNORMAL LOW (ref 39.0–52.0)
Hemoglobin: 10.4 g/dL — ABNORMAL LOW (ref 13.0–17.0)
Immature Granulocytes: 1 %
Lymphocytes Relative: 15 %
Lymphs Abs: 0.8 K/uL (ref 0.7–4.0)
MCH: 35.3 pg — ABNORMAL HIGH (ref 26.0–34.0)
MCHC: 34 g/dL (ref 30.0–36.0)
MCV: 103.7 fL — ABNORMAL HIGH (ref 80.0–100.0)
Monocytes Absolute: 0.4 K/uL (ref 0.1–1.0)
Monocytes Relative: 7 %
Neutro Abs: 4.2 K/uL (ref 1.7–7.7)
Neutrophils Relative %: 76 %
Platelets: 219 K/uL (ref 150–400)
RBC: 2.95 MIL/uL — ABNORMAL LOW (ref 4.22–5.81)
RDW: 13.1 % (ref 11.5–15.5)
WBC: 5.6 K/uL (ref 4.0–10.5)
nRBC: 0 % (ref 0.0–0.2)

## 2023-09-04 LAB — TROPONIN I (HIGH SENSITIVITY)
Troponin I (High Sensitivity): 41 ng/L — ABNORMAL HIGH (ref <18)
Troponin I (High Sensitivity): 39 ng/L — ABNORMAL HIGH (ref <18)

## 2023-09-04 LAB — BASIC METABOLIC PANEL WITH GFR
Anion gap: 9 (ref 5–15)
BUN: 15 mg/dL (ref 8–23)
CO2: 25 mmol/L (ref 22–32)
Calcium: 9 mg/dL (ref 8.9–10.3)
Chloride: 104 mmol/L (ref 98–111)
Creatinine, Ser: 0.97 mg/dL (ref 0.61–1.24)
GFR, Estimated: >60 mL/min (ref >60)
Glucose, Bld: 102 mg/dL — ABNORMAL HIGH (ref 70–99)
Potassium: 3.7 mmol/L (ref 3.5–5.1)
Sodium: 138 mmol/L (ref 135–145)

## 2023-09-04 MED ORDER — ONDANSETRON HCL 4 MG/2ML IJ SOLN
4.0000 mg | Freq: Once | INTRAMUSCULAR | Status: AC
  Administered 2023-09-04: 4 mg via INTRAVENOUS
  Filled 2023-09-04: qty 2

## 2023-09-04 MED ORDER — HYDROCODONE-ACETAMINOPHEN 5-325 MG PO TABS: 1.0000 | ORAL_TABLET | ORAL | 0 refills | Status: DC | PRN

## 2023-09-04 MED ORDER — MORPHINE SULFATE (PF) 4 MG/ML IV SOLN
4.0000 mg | Freq: Once | INTRAVENOUS | Status: AC
  Administered 2023-09-04: 4 mg via INTRAVENOUS
  Filled 2023-09-04: qty 1

## 2023-09-04 NOTE — ED Provider Notes (Signed)
 [place] EMERGENCY DEPARTMENT AT [facility] Provider Note   CSN: [id number] Arrival date & time: 09/04/23  9196     Patient presents with: Torticollis   Derrick Long is a 89 y.o. male.  {Add pertinent medical, surgical, social history, OB history to HPI:32947} Pt is a 89 yo male with pmhx significant for HTN, GERD, prostate cancer, HLD, gout, anemia, paroxysmal atrial fib s/p pacemaker placement (no thinners due to GIB).  Pt presents to the ED today with pain in his neck when he turns his head.  No headache.  No n/v.  Sx have been going on for a few days, but it was worse this am.  No numbness in arms.  He took an ibuprofen  this am which did help the pain.  No cp.       Prior to Admission medications   Medication Sig Start Date End Date Taking? Authorizing Provider  acetaminophen  (TYLENOL ) 650 MG CR tablet Take 1,300 mg by mouth every 8 (eight) hours as needed for pain.    [provider]  amLODipine  (NORVASC ) 10 MG tablet Take 1 tablet by mouth daily. 02/13/22   [provider]  carvedilol  (COREG ) 3.125 MG tablet Take 3.125 mg by mouth 2 (two) times daily with a meal.    [provider]  diclofenac  sodium (VOLTAREN ) 1 % GEL Apply 2 g topically 4 (four) times daily. 06/19/17   Rancour, Garnette, MD  Ensure (ENSURE) Take 237 mLs by mouth daily as needed (nutrition). Vanilla or chocolate    [provider]  ferrous sulfate  324 MG TBEC Take 324 mg by mouth. Takes one tablet every other day    [provider]  furosemide  (LASIX ) 20 MG tablet Take 20 mg by mouth. One half tablet daily 12/04/21   [provider]  lisinopril (ZESTRIL) 10 MG tablet Take 1 tablet by mouth daily. 03/06/22   [provider]  nitrofurantoin , macrocrystal-monohydrate, (MACROBID ) 100 MG capsule Take 1 capsule (100 mg total) by mouth every 12 (twelve) hours. 10/16/22   McKenzie, Belvie CROME, MD  pantoprazole  (PROTONIX ) 40 MG tablet TAKE 1  TABLET BY MOUTH ONCE DAILY. 08/02/23   Carlan, Mitzie CROME, NP  predniSONE  (DELTASONE ) 5 MG tablet Take 1 tablet (5 mg total) by mouth daily with breakfast. 03/16/23   Rogers Hai, MD  predniSONE  (DELTASONE ) 5 MG tablet TAKE ONE TABLET BY MOUTH DAILY WITH BREAKFAST 08/02/23   Rogers Hai, MD  tamsulosin  (FLOMAX ) 0.4 MG CAPS capsule Take 1 capsule (0.4 mg total) by mouth 2 (two) times daily. 11/06/22   McKenzie, Belvie CROME, MD  traMADol  (ULTRAM ) 50 MG tablet Take 1 tablet (50 mg total) by mouth at bedtime as needed. 03/25/22   Magnant, Carlin CROME, PA-C    Allergies: Patient has no known allergies.    Review of Systems  Musculoskeletal:  Positive for neck pain.  All other systems reviewed and are negative.   Updated Vital Signs BP (!) 148/72 (BP Location: Left Arm)   Pulse 67   Temp 98.4 F (36.9 C) (Oral)   Resp (!) 27   Ht 6' (1.829 m)   Wt 78 kg   SpO2 92%   BMI 23.33 kg/m   Physical Exam Vitals and nursing note reviewed.  Constitutional:      Appearance: Normal appearance.  HENT:     Head: Normocephalic and atraumatic.     Right Ear: External ear normal.     Left Ear: External ear normal.  Nose: Nose normal.     Mouth/Throat:     Mouth: Mucous membranes are moist.     Pharynx: Oropharynx is clear.  Eyes:     Extraocular Movements: Extraocular movements intact.     Conjunctiva/sclera: Conjunctivae normal.     Pupils: Pupils are equal, round, and reactive to light.  Neck:   Cardiovascular:     Rate and Rhythm: Normal rate and regular rhythm.     Pulses: Normal pulses.     Heart sounds: Normal heart sounds.  Pulmonary:     Effort: Pulmonary effort is normal.     Breath sounds: Normal breath sounds.  Abdominal:     General: Abdomen is flat. Bowel sounds are normal.     Palpations: Abdomen is soft.  Musculoskeletal:        General: Normal range of motion.     Cervical back: Torticollis present.  Skin:    General: Skin is warm.     Capillary Refill:  Capillary refill takes less than 2 seconds.  Neurological:     General: No focal deficit present.     Mental Status: He is alert.  Psychiatric:        Mood and Affect: Mood normal.     (all labs ordered are listed, but only abnormal results are displayed) Labs Reviewed  BASIC METABOLIC PANEL WITH GFR - Abnormal; Notable for the following components:      Result Value   Glucose, Bld 102 (*)    All other components within normal limits  CBC WITH DIFFERENTIAL/PLATELET - Abnormal; Notable for the following components:   RBC 2.95 (*)    Hemoglobin 10.4 (*)    HCT 30.6 (*)    MCV 103.7 (*)    MCH 35.3 (*)    All other components within normal limits  TROPONIN I (HIGH SENSITIVITY)    EKG: EKG Interpretation Date/Time:  Saturday September 04 2023 08:19:36 EDT Ventricular Rate:  72 PR Interval:  220 QRS Duration:  199 QT Interval:  500 QTC Calculation: 548 R Axis:   -80  Text Interpretation: VENTRICULAR PACED RHYTHM Confirmed by Dean Clarity [phone]) on 09/04/2023 9:09:09 AM  Radiology: CT Cervical Spine Wo Contrast Result Date: 09/04/2023 CLINICAL DATA:  89 year old male with neck pain. Resting pain, painful range of motion. Symptoms upon waking, no trauma reported. History of prostate and colon cancer. EXAM: CT CERVICAL SPINE WITHOUT CONTRAST TECHNIQUE: Multidetector CT imaging of the cervical spine was performed without intravenous contrast. Multiplanar CT image reconstructions were also generated. RADIATION DOSE REDUCTION: This exam was performed according to the departmental dose-optimization program which includes automated exposure control, adjustment of the mA and/or kV according to patient size and/or use of iterative reconstruction technique. COMPARISON:  Cervical spine CT 10/13/2030. FINDINGS: Alignment: Cervical lordosis is stable since 2023. Stable cervicothoracic junction alignment with mild degenerative spondylolisthesis. Maintained posterior element alignment. Skull base and  vertebrae: Stable bone mineralization since 2023, within normal limits for age. Visualized skull base is intact. No atlanto-occipital dissociation. C1 and C2 are severely degenerated but appear intact and aligned. No acute or suspicious osseous lesion identified in the cervical vertebrae. See details below. Soft tissues and spinal canal: No visible canal hematoma. Bulky calcified cervical carotid atherosclerosis. Subtle new edema or fluid in the prevertebral space, eccentric to the left and most visible on series 4, image 50 at the C4 cervical level. No evidence of superimposed neck mass or lymphadenopathy. Disc levels: Chronic severe cervical spine degeneration. Anterior chronic C1-C2 degeneration including bulky  and partially calcified ligamentous hypertrophy. Longus coli muscle insertion calcific tendinitis not excluded. Chronic degenerative ankylosis at C5-C6, and developing at C7-T1. Diffuse bilateral cervical facet arthropathy. Possible developing facet ankylosis also at C7-T1. But by CT only mild cervical spinal stenosis is suspected. Upper chest: Visible upper thoracic levels appear grossly stable and intact. Stable lung apices. Other findings: Cervicomedullary junction is within normal limits. Negative visible posterior fossa. Visible tympanic cavities and mastoids are clear. IMPRESSION: 1. Positive for subtle prevertebral edema or fluid, new since 2023. This is nonspecific. No acute or suspicious bone lesion in the cervical spine but if there are signs/symptoms of infection then early discitis osteomyelitis could not be excluded. Alternatively Calcific tendinitis of the longus colli muscles can have a similar appearance, and responds with NSAID treatment. 2. Advanced cervical spine degeneration, not significantly changed from a 2023 CT. 3. Calcified carotid artery atherosclerosis. Electronically Signed   By: VEAR Hurst M.D.   On: 09/04/2023 09:18    {Document cardiac monitor, telemetry assessment procedure  when appropriate:32947} Procedures   Medications Ordered in the ED - No data to display    {Click here for ABCD2, HEART and other calculators REFRESH Note before signing:1}                              Medical Decision Making Amount and/or Complexity of Data Reviewed Labs: ordered. Radiology: ordered.  Risk Prescription drug management.   This patient presents to the ED for concern of neck pain, this involves an extensive number of treatment options, and is a complaint that carries with it a high risk of complications and morbidity.  The differential diagnosis includes torticollis, msk, met, cervical disc herniation, cardiac   Co morbidities that complicate the patient evaluation  HTN, GERD, prostate cancer, HLD, gout, anemia, paroxysmal atrial fib s/p pacemaker placement (no thinners)   Additional history obtained:  Additional history obtained from epic chart review External records from outside source obtained and reviewed including family   Lab Tests:  I Ordered, and personally interpreted labs.  The pertinent results include:  cbc with hgb 10.4 (stable); bmp nl; trop sl elevated at 41   Imaging Studies ordered:  I ordered imaging studies including ct cervical spine  I independently visualized and interpreted imaging which showed  . Positive for subtle prevertebral edema or fluid, new since 2023.  This is nonspecific. No acute or suspicious bone lesion in the  cervical spine but if there are signs/symptoms of infection then  early discitis osteomyelitis could not be excluded.  Alternatively Calcific tendinitis of the longus colli muscles can  have a similar appearance, and responds with NSAID treatment.  2. Advanced cervical spine degeneration, not significantly changed  from a 2023 CT.  3. Calcified carotid artery atherosclerosis.   I agree with the radiologist interpretation   Cardiac Monitoring:  The patient was maintained on a cardiac monitor.  I  personally viewed and interpreted the cardiac monitored which showed an underlying rhythm of: paced rhythm   Medicines ordered and prescription drug management:   I have reviewed the patients home medicines and have made adjustments as needed   Test Considered:  ct   Problem List / ED Course:  Neck pain:  likely torticollis.  Pt has no evidence of any infectious sx or any neurologic sx.  I suspect it's calcification of the longus colli muscles.  Pt has a pacemaker, so I am unable to get a  MRI.     Reevaluation:  After the interventions noted above, I reevaluated the patient and found that they have :improved   Social Determinants of Health:  Lives at home   Dispostion:  After consideration of the diagnostic results and the patients response to treatment, I feel that the patent would benefit from discharge with outpatient f/u.    {Document critical care time when appropriate  Document review of labs and clinical decision tools ie [id number], etc  Document your independent review of radiology images and any outside records  Document your discussion with family members, caretakers and with consultants  Document social determinants of health affecting pt's care  Document your decision making why or why not admission, treatments were needed:32947:::1}   Final diagnoses:  Torticollis    ED Discharge Orders     None

## 2023-09-04 NOTE — ED Triage Notes (Signed)
 Pt c/o stiff neck; flexion/extension pain 1/10, lateral turn 8/10; resting pain 1/10; pt denies injury or activity; pt states pain and stiffness started upon waking. Denies headache, N/V/D; A&Ox4.

## 2023-09-15 ENCOUNTER — Encounter: Payer: Self-pay | Admitting: Urology

## 2023-09-15 ENCOUNTER — Ambulatory Visit: Admitting: Urology

## 2023-09-15 VITALS — BP 162/67 | HR 72

## 2023-09-15 DIAGNOSIS — N401 Enlarged prostate with lower urinary tract symptoms: Secondary | ICD-10-CM | POA: Diagnosis not present

## 2023-09-15 DIAGNOSIS — N138 Other obstructive and reflux uropathy: Secondary | ICD-10-CM

## 2023-09-15 DIAGNOSIS — R338 Other retention of urine: Secondary | ICD-10-CM | POA: Diagnosis not present

## 2023-09-15 DIAGNOSIS — R339 Retention of urine, unspecified: Secondary | ICD-10-CM

## 2023-09-15 DIAGNOSIS — C61 Malignant neoplasm of prostate: Secondary | ICD-10-CM

## 2023-09-15 LAB — URINALYSIS, ROUTINE W REFLEX MICROSCOPIC
Bilirubin, UA: NEGATIVE
Glucose, UA: NEGATIVE
Ketones, UA: NEGATIVE
Nitrite, UA: NEGATIVE
Protein,UA: NEGATIVE
RBC, UA: NEGATIVE
Specific Gravity, UA: 1.01 (ref 1.005–1.030)
Urobilinogen, Ur: 0.2 mg/dL (ref 0.2–1.0)
pH, UA: 6 (ref 5.0–7.5)

## 2023-09-15 LAB — MICROSCOPIC EXAMINATION
Epithelial Cells (non renal): NONE SEEN /HPF (ref 0–10)
RBC, Urine: NONE SEEN /HPF (ref 0–2)

## 2023-09-15 MED ORDER — TAMSULOSIN HCL 0.4 MG PO CAPS: 0.4000 mg | ORAL_CAPSULE | Freq: Two times a day (BID) | ORAL | 11 refills | Status: AC

## 2023-09-15 NOTE — Progress Notes (Signed)
 09/15/2023 9:26 AM   Derrick Long 1926/05/18 [id number]  Referring provider: Estefana Doffing, MD [address] [facility] [place],  [place] [address]  Followup prostate cancer   HPI: Derrick Long is a 89yo here for followup for Prostate cancer and BPH. IPSS 6 QOL 2 on flomax  0.4mg  BID. Urine stream strong. Nocturia 1x. No straining to urinate.    PMH: Past Medical History:  Diagnosis Date   Acid reflux    Acute colitis 09/16/2019   AKI (acute kidney injury) (HCC) 07/31/2012   Anemia    BENIGN PROSTATIC HYPERTROPHY, WITH OBSTRUCTION 12/18/2005   Qualifier: Diagnosis of  By: Olene Dodrill     Chest pain 07/31/2012   Colitis 09/16/2019   Colon cancer (HCC)    per patient, colon cancer more than 10 years ago s/p right hemicolectomy   Elevated bilirubin    Elevated LFTs    Ex-cigarette smoker    Gout    Hyperlipemia    Hypertension    Hyponatremia 08/01/2021   Intractable abdominal pain 02/03/2021   LATERAL MENISCUS TEAR, RIGHT 04/19/2006   Qualifier: Diagnosis of  By: Karren MD, Cornelius     Leukocytosis, unspecified 07/31/2012   Paroxysmal atrial fibrillation (HCC) 2014   Presence of permanent cardiac pacemaker    Prostate cancer (HCC) 08/08/2013   SEEDS 20 YEARS AGO   Prostate disease    RENAL CALCULUS 12/18/2005   Qualifier: Diagnosis of  By: Olene Dodrill     RUQ pain    UTI (urinary tract infection) 03/26/2021    Surgical History: Past Surgical History:  Procedure Laterality Date   BIOPSY  02/05/2021   Procedure: BIOPSY;  Surgeon: Eartha Angelia Sieving, MD;  [facility];  Service: Gastroenterology;;   BIOPSY  08/02/2021   Procedure: BIOPSY;  Surgeon: Shaaron Lamar HERO, MD;  [facility];  Service: Endoscopy;;   COLON SURGERY     Right hemicolectomy in the setting of colon cancer, per patient   CYSTOSCOPY WITH INSERTION OF UROLIFT N/A 05/19/2021   Procedure: CYSTOSCOPY WITH INSERTION OF UROLIFT;  Surgeon: Sherrilee Belvie LITTIE, MD;  Location: [facility];  Service: Urology;  Laterality: N/A;   ENTEROSCOPY N/A 11/21/2021   Procedure: PUSH ENTEROSCOPY;  Surgeon: Eartha Angelia Sieving, MD;  [facility];  Service: Gastroenterology;  Laterality: N/A;   ESOPHAGOGASTRODUODENOSCOPY (EGD) WITH PROPOFOL  N/A 02/05/2021   Procedure: ESOPHAGOGASTRODUODENOSCOPY (EGD) WITH PROPOFOL ;  Surgeon: Eartha Angelia Sieving, MD;  [facility];  Service: Gastroenterology;  Laterality: N/A;   ESOPHAGOGASTRODUODENOSCOPY (EGD) WITH PROPOFOL  N/A 08/02/2021   Procedure: ESOPHAGOGASTRODUODENOSCOPY (EGD) WITH PROPOFOL ;  Surgeon: Shaaron Lamar HERO, MD;  [facility];  Service: Endoscopy;  Laterality: N/A;   ESOPHAGOGASTRODUODENOSCOPY (EGD) WITH PROPOFOL  N/A 11/21/2021   Procedure: ESOPHAGOGASTRODUODENOSCOPY (EGD) WITH PROPOFOL ;  Surgeon: Eartha Angelia Sieving, MD;  [facility];  Service: Gastroenterology;  Laterality: N/A;  1200  ASA 3   HOT HEMOSTASIS  08/02/2021   Procedure: HOT HEMOSTASIS (ARGON PLASMA COAGULATION/BICAP);  Surgeon: Shaaron Lamar HERO, MD;  [facility];  Service: Endoscopy;;   HOT HEMOSTASIS  11/21/2021   Procedure: HOT HEMOSTASIS (ARGON PLASMA COAGULATION/BICAP);  Surgeon: Eartha Angelia, Sieving, MD;  [facility];  Service: Gastroenterology;;   LEFT HEART CATHETERIZATION WITH CORONARY ANGIOGRAM N/A 07/21/2012   Procedure: LEFT HEART CATHETERIZATION WITH CORONARY ANGIOGRAM;  Surgeon: Debby DELENA Sor, MD;  Location: [facility] CATH LAB;  Service: Cardiovascular;  Laterality: N/A;   PACEMAKER INSERTION  PERMANENT PACEMAKER INSERTION N/A 07/22/2012   Procedure: PERMANENT PACEMAKER INSERTION;  Surgeon: Jerel Balding, MD;  Location: [facility] CATH LAB;  Service: Cardiovascular;  Laterality: N/A;   PROSTATE SURGERY      Home Medications:  Allergies as of 09/15/2023   No Known Allergies      Medication List        Accurate as of September 15, 2023  9:26 AM. If you have any  questions, ask your nurse or doctor.          acetaminophen  650 MG CR tablet Commonly known as: TYLENOL  Take 1,300 mg by mouth every 8 (eight) hours as needed for pain.   amLODipine  10 MG tablet Commonly known as: NORVASC  Take 1 tablet by mouth daily.   carvedilol  3.125 MG tablet Commonly known as: COREG  Take 3.125 mg by mouth 2 (two) times daily with a meal.   diclofenac  sodium 1 % Gel Commonly known as: VOLTAREN  Apply 2 g topically 4 (four) times daily.   Ensure Take 237 mLs by mouth daily as needed (nutrition). Vanilla or chocolate   ferrous sulfate  324 MG Tbec Take 324 mg by mouth. Takes one tablet every other day   furosemide  20 MG tablet Commonly known as: LASIX  Take 20 mg by mouth. One half tablet daily   HYDROcodone -acetaminophen  5-325 MG tablet Commonly known as: NORCO/VICODIN Take 1 tablet by mouth every 4 (four) hours as needed.   lisinopril 10 MG tablet Commonly known as: ZESTRIL Take 1 tablet by mouth daily.   nitrofurantoin  (macrocrystal-monohydrate) 100 MG capsule Commonly known as: MACROBID  Take 1 capsule (100 mg total) by mouth every 12 (twelve) hours.   pantoprazole  40 MG tablet Commonly known as: PROTONIX  TAKE 1 TABLET BY MOUTH ONCE DAILY.   predniSONE  5 MG tablet Commonly known as: DELTASONE  Take 1 tablet (5 mg total) by mouth daily with breakfast.   predniSONE  5 MG tablet Commonly known as: DELTASONE  TAKE ONE TABLET BY MOUTH DAILY WITH BREAKFAST   tamsulosin  0.4 MG Caps capsule Commonly known as: FLOMAX  Take 1 capsule (0.4 mg total) by mouth 2 (two) times daily.   traMADol  50 MG tablet Commonly known as: ULTRAM  Take 1 tablet (50 mg total) by mouth at bedtime as needed.        Allergies: No Known Allergies  Family History: Family History  Problem Relation Age of Onset   Cancer Neg Hx     Social History:  reports that he quit smoking about 21 years ago. His smoking use included cigarettes. He has been exposed to tobacco  smoke. He has quit using smokeless tobacco.  His smokeless tobacco use included chew. He reports that he does not drink alcohol and does not use drugs.  ROS: All other review of systems were reviewed and are negative except what is noted above in HPI  Physical Exam: BP (!) 162/67   Pulse 72   Constitutional:  Alert and oriented, No acute distress. HEENT: [place] AT, moist mucus membranes.  Trachea midline, no masses. Cardiovascular: No clubbing, cyanosis, or edema. Respiratory: Normal respiratory effort, no increased work of breathing. GI: Abdomen is soft, nontender, nondistended, no abdominal masses GU: No CVA tenderness.  Lymph: No cervical or inguinal lymphadenopathy. Skin: No rashes, bruises or suspicious lesions. Neurologic: Grossly intact, no focal deficits, moving all 4 extremities. Psychiatric: Normal mood and affect.  Laboratory Data: Lab Results  Component Value Date   WBC 5.6 09/04/2023   HGB 10.4 (L) 09/04/2023   HCT 30.6 (L) 09/04/2023  MCV 103.7 (H) 09/04/2023   PLT 219 09/04/2023    Lab Results  Component Value Date   CREATININE 0.97 09/04/2023    Lab Results  Component Value Date   PSA Refused 12/22/2007   PSA 0.07 (L) 06/24/2007   PSA 0.10 05/25/2006    No results found for: TESTOSTERONE  Lab Results  Component Value Date   HGBA1C 5.6 07/19/2012    Urinalysis    Component Value Date/Time   COLORURINE YELLOW 10/25/2021 2010   APPEARANCEUR Clear 11/06/2022 1002   LABSPEC 1.017 10/25/2021 2010   PHURINE 6.0 10/25/2021 2010   GLUCOSEU Negative 11/06/2022 1002   HGBUR MODERATE (A) 10/25/2021 2010   HGBUR negative 06/24/2007 0833   BILIRUBINUR Negative 11/06/2022 1002   KETONESUR NEGATIVE 10/25/2021 2010   PROTEINUR Negative 11/06/2022 1002   PROTEINUR 100 (A) 10/25/2021 2010   UROBILINOGEN 1.0 07/29/2013 2032   NITRITE Negative 11/06/2022 1002   NITRITE NEGATIVE 10/25/2021 2010   LEUKOCYTESUR Negative 11/06/2022 1002   LEUKOCYTESUR MODERATE  (A) 10/25/2021 2010    Lab Results  Component Value Date   LABMICR Comment 11/06/2022   WBCUA 6-10 (A) 10/16/2022   LABEPIT 0-10 10/16/2022   MUCUS Present (A) 10/08/2022   BACTERIA Few 10/16/2022    Pertinent Imaging:  Results for orders placed in visit on 07/29/01  DG Abd 1 View  Narrative FINDINGS CLINICAL DATA:  PROSTATE CA. SINGLE VIEW ABDOMEN A NORMAL BOWEL GAS PATTERN IS DEMONSTRATED.  MILD TO MODERATE SPUR FORMATION IS DEMONSTRATED AT MULTIPLE LEVELS OF THE LUMBAR SPINE AS WELL AS MINIMAL LEVOCONVEX LUMBAR ROTARY SCOLIOSIS.  ALSO NOTED IS BRIDGING OSSIFICATION OR CALCIFICATION BETWEEN THE LOWER LUMBAR SPINE AND LEFT ILIAC WING. A TRANSITIONAL LUMBOSACRAL VERTEBRA IS ALSO DEMONSTRATED.  MILD ATHEROMATOUS ARTERIAL CALCIFICATIONS ARE NOTED. IMPRESSION NO ACUTE ABNORMALITY AND NO EVIDENCE OF BONY METASTATIC DISEASE.  No results found for this or any previous visit.  No results found for this or any previous visit.  No results found for this or any previous visit.  Results for orders placed during the hospital encounter of 08/01/13  US  Renal  Narrative CLINICAL DATA:  Acute urinary retention  EXAM: RENAL/URINARY TRACT ULTRASOUND COMPLETE  COMPARISON:  04/14/2004  FINDINGS: Right Kidney:  Length: 10.2 cm. Complex 2.0 x 1.8 x 1.6 cm echogenic lesion, right kidney lower pole. We were not able the demonstrate definite blood flow within this lesion. Hypoechoic but minimally complex 2.6 x 2.2 x 1.9 cm lesion, right kidney lower pole.  Left Kidney:  Length: 1.3 cm. Mild prominence of renal sinus adipose tissue. Tiny probable cyst, left mid-upper kidney, 4 mm. Equivocal small mid kidney nonobstructive calculus.  Bladder:  Nondistended.  Catheter in urinary bladder.  IMPRESSION: 1. Complex 2 cm echogenic lesion, right kidney lower pole, in vicinity of prior enhancing nodule. In 2006 this lesion measured 1.4 cm. This may represent slow growth of a small  renal cell carcinoma. 2. Minimally complex 2.6 cm right kidney lower pole lesion, most likely a complex cyst although technically nonspecific. 3. Equivocal small left mid kidney nonobstructive calculus. Mild renal sinus adipose tissue prominence on the left.   Electronically Signed By: Davina Salvage M.D. On: 08/01/2013 15:54  No results found for this or any previous visit.  No results found for this or any previous visit.  Results for orders placed during the hospital encounter of 02/19/21  CT Renal Stone Study  Narrative CLINICAL DATA:  Urinary retention.  History of prostate cancer.  EXAM: CT ABDOMEN AND  PELVIS WITHOUT CONTRAST  TECHNIQUE: Multidetector CT imaging of the abdomen and pelvis was performed following the standard protocol without IV contrast.  COMPARISON:  02/03/2021.  FINDINGS: Lower chest: The heart is normal in size and scattered coronary artery calcifications are noted. Pacemaker leads are present in the heart. A 4 mm nodule is present in the right lower lobe, axial image 8.  Hepatobiliary: The liver is within normal limits. Mild intrahepatic and extrahepatic biliary ductal dilatation is seen which is likely due to patient's post cholecystectomy status.  Pancreas: Unremarkable. No pancreatic ductal dilatation or surrounding inflammatory changes.  Spleen: Normal in size without focal abnormality.  Adrenals/Urinary Tract: There is redemonstration of a right adrenal mass measuring 3.2 cm, unchanged. The left adrenal gland is within normal limits. Scattered hypo and hyperdense structures are present in the kidneys bilaterally. Complex masslike lesions are noted in the kidneys bilaterally measuring 2.6 cm in the mid left kidney and 2.6 cm in the mid right kidney. No renal or ureteral calculus or obstructive uropathy bilaterally. The bladder is unremarkable.  Stomach/Bowel: The stomach is nondistended. No bowel obstruction, free air or  pneumatosis. A few scattered diverticular noted along the colon without evidence of diverticulitis. Right hemicolectomy changes are noted.  Vascular/Lymphatic: Aortic atherosclerosis with ectasia of the distal abdominal aorta. 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 The common iliac artery on the left measures 1.8 cm. No abdominal or pelvic lymphadenopathy.  Reproductive: The prostate gland is enlarged and contains multiple radiopaque brachytherapy seeds.  Other: No free fluid. A small fat containing umbilical hernia is noted. There is a small right inguinal fat containing hernia.  Musculoskeletal: Degenerative changes are present in the thoracolumbar spine and bilateral hips. A stable sclerotic lesion is noted in the L2 vertebral body on the right, possible bone island.  IMPRESSION: 1. No renal or ureteral calculus or obstructive uropathy bilaterally. 2. Multiple hyperdense and hypodense lesions in the kidneys bilaterally. Complex masses are noted in the mid kidneys bilaterally, compatible with history of multifocal renal cell carcinoma and slightly increased in size from the prior exam. 3. Enlarged prostate gland containing brachytherapy seeds. 4. Right lower lobe pulmonary nodule. No follow-up needed if patient is low-risk. Non-contrast chest CT can be considered in 12 months if patient is high-risk. This recommendation follows the consensus statement: Guidelines for Management of Incidental Pulmonary Nodules Detected on CT Images: From the Fleischner Society 2017; Radiology 2017; 284:228-243. 5. Aortic atherosclerosis. 6. Coronary artery calcifications. 7. Remaining chronic findings as described above.   Electronically Signed By: Leita Birmingham M.D. On: 02/20/2021 00:28   Assessment & Plan:    1. Prostate cancer (HCC) (Primary) PSA today, followup 1 year with PSA - Urinalysis, Routine w reflex microscopic  2. Urinary  retention Continue flomax  BID  3. Benign prostatic hyperplasia with urinary obstruction Continue flomax  0.4mg  BID   No follow-ups on file.  Belvie Clara, MD  [facility] [place]

## 2023-09-15 NOTE — Patient Instructions (Signed)

## 2023-09-16 ENCOUNTER — Ambulatory Visit: Payer: Self-pay

## 2023-09-16 LAB — PSA: Prostate Specific Ag, Serum: 9 ng/mL — ABNORMAL HIGH (ref 0.0–4.0)

## 2023-09-16 NOTE — Telephone Encounter (Signed)
 Daughter called and made aware of recommended IsoPsa.  Appointment scheduled.

## 2023-09-16 NOTE — Telephone Encounter (Signed)
-----   Message from Belvie Clara sent at 09/16/2023  2:22 PM EDT ----- PSA is high at 9.0. please get isoPSA ----- Message ----- From: Rebecka Memos Lab Results In Sent: 09/15/2023   3:35 PM EDT To: Belvie LITTIE Clara, MD

## 2023-09-21 ENCOUNTER — Telehealth: Payer: Self-pay

## 2023-09-21 ENCOUNTER — Other Ambulatory Visit

## 2023-09-21 NOTE — Telephone Encounter (Signed)
 Called to reschedule pt appt for ISO appt rescheduled and confirmed

## 2023-09-22 ENCOUNTER — Other Ambulatory Visit

## 2023-10-06 ENCOUNTER — Other Ambulatory Visit

## 2023-10-12 ENCOUNTER — Other Ambulatory Visit: Payer: Self-pay | Admitting: *Deleted

## 2023-10-12 ENCOUNTER — Encounter: Payer: Self-pay | Admitting: Urology

## 2023-10-12 ENCOUNTER — Telehealth: Payer: Self-pay

## 2023-10-12 DIAGNOSIS — N390 Urinary tract infection, site not specified: Secondary | ICD-10-CM

## 2023-10-12 DIAGNOSIS — C61 Malignant neoplasm of prostate: Secondary | ICD-10-CM

## 2023-10-12 NOTE — Telephone Encounter (Signed)
 Daughter called and made aware of patients IsoPsa results and Dr. Little recommendation for patient to keep scheduled f/u.

## 2023-10-21 ENCOUNTER — Emergency Department (HOSPITAL_COMMUNITY)

## 2023-10-21 ENCOUNTER — Other Ambulatory Visit: Payer: Self-pay

## 2023-10-21 ENCOUNTER — Emergency Department (HOSPITAL_COMMUNITY)
Admission: EM | Admit: 2023-10-21 | Discharge: 2023-10-21 | Disposition: A | Discharge status: Home / Self Care | Attending: Emergency Medicine | Admitting: Emergency Medicine

## 2023-10-21 DIAGNOSIS — W19XXXA Unspecified fall, initial encounter: Secondary | ICD-10-CM | POA: Insufficient documentation

## 2023-10-21 DIAGNOSIS — M25532 Pain in left wrist: Secondary | ICD-10-CM | POA: Diagnosis present

## 2023-10-21 MED ORDER — HYDROCODONE-ACETAMINOPHEN 5-325 MG PO TABS
1.0000 | ORAL_TABLET | Freq: Once | ORAL | Status: AC
  Administered 2023-10-21: 1 via ORAL
  Filled 2023-10-21: qty 1

## 2023-10-21 MED ORDER — ONDANSETRON 8 MG PO TBDP
8.0000 mg | ORAL_TABLET | Freq: Once | ORAL | Status: AC
  Administered 2023-10-21: 8 mg via ORAL
  Filled 2023-10-21: qty 1

## 2023-10-21 MED ORDER — ONDANSETRON 4 MG PO TBDP: 4.0000 mg | ORAL_TABLET | Freq: Three times a day (TID) | ORAL | 0 refills | Status: DC | PRN

## 2023-10-21 MED ORDER — HYDROCODONE-ACETAMINOPHEN 5-325 MG PO TABS: 2.0000 | ORAL_TABLET | Freq: Four times a day (QID) | ORAL | 0 refills | Status: DC | PRN

## 2023-10-21 MED ORDER — ACETAMINOPHEN 500 MG PO TABS
1000.0000 mg | ORAL_TABLET | Freq: Once | ORAL | Status: DC
  Filled 2023-10-21: qty 2

## 2023-10-21 NOTE — ED Triage Notes (Signed)
 Pt states he tripped and fell 2 days ago. C/o pain to left wrist/hand and right lower back. Pain to both shoulder. Denies hitting head. Pt a/o. Ambulatory with cane. Deformity and Swelling noted around left wrist and lower hand area with radial pulses present. Cap refill immediate. Pt c/o walking with nausea today.

## 2023-10-21 NOTE — Discharge Instructions (Signed)
 As discussed, your evaluation today has been largely reassuring.  But, it is important that you monitor your condition carefully, and do not hesitate to return to the ED if you develop new, or concerning changes in your condition.  Otherwise, please follow-up with Dr. Margrette for appropriate ongoing care.

## 2023-10-21 NOTE — ED Provider Notes (Signed)
 [place] EMERGENCY DEPARTMENT AT [facility] Provider Note   CSN: [id number] Arrival date & time: 10/21/23  [id number]     Patient presents with: Derrick Long   Derrick Long is a 89 y.o. male.   HPI Patient presents after a fall that occurred 2 days ago.  Since that time he has had pain in the left wrist, worsening. He did not seek care as he was at a funeral service. He is here with the daughter who assists with the history.  No other injuries, no other complaints, he notes mechanical fall, landing against his right wrist, which is painful.  No loss of sensation distally.    Prior to Admission medications   Medication Sig Start Date End Date Taking? Authorizing Provider  acetaminophen  (TYLENOL ) 650 MG CR tablet Take 1,300 mg by mouth every 8 (eight) hours as needed for pain.    [provider]  amLODipine  (NORVASC ) 10 MG tablet Take 1 tablet by mouth daily. 02/13/22   [provider]  carvedilol  (COREG ) 3.125 MG tablet Take 3.125 mg by mouth 2 (two) times daily with a meal.    [provider]  diclofenac  sodium (VOLTAREN ) 1 % GEL Apply 2 g topically 4 (four) times daily. 06/19/17   Rancour, Garnette, MD  Ensure (ENSURE) Take 237 mLs by mouth daily as needed (nutrition). Vanilla or chocolate    [provider]  ferrous sulfate  324 MG TBEC Take 324 mg by mouth. Takes one tablet every other day    [provider]  furosemide  (LASIX ) 20 MG tablet Take 20 mg by mouth. One half tablet daily 12/04/21   [provider]  HYDROcodone -acetaminophen  (NORCO/VICODIN) 5-325 MG tablet Take 1 tablet by mouth every 4 (four) hours as needed. 09/04/23   Haviland, Julie, MD  lisinopril (ZESTRIL) 10 MG tablet Take 1 tablet by mouth daily. 03/06/22   [provider]  pantoprazole  (PROTONIX ) 40 MG tablet TAKE 1 TABLET BY MOUTH ONCE DAILY. 08/02/23   Carlan, Chelsea L, NP  tamsulosin  (FLOMAX ) 0.4 MG CAPS capsule Take 1 capsule (0.4 mg total) by mouth  2 (two) times daily. 09/15/23   McKenzie, Belvie LITTIE, MD  traMADol  (ULTRAM ) 50 MG tablet Take 1 tablet (50 mg total) by mouth at bedtime as needed. 03/25/22   Magnant, Carlin LITTIE, PA-C    Allergies: Patient has no known allergies.    Review of Systems  Updated Vital Signs BP (!) 137/57 (BP Location: Right Arm)   Pulse 61   Temp 97.9 F (36.6 C) (Oral)   Resp 18   SpO2 98%   Physical Exam Vitals and nursing note reviewed.  Constitutional:      General: He is not in acute distress.    Appearance: He is well-developed.  HENT:     Head: Normocephalic and atraumatic.  Eyes:     Conjunctiva/sclera: Conjunctivae normal.  Cardiovascular:     Rate and Rhythm: Normal rate and regular rhythm.     Pulses: Normal pulses.  Pulmonary:     Effort: Pulmonary effort is normal. No respiratory distress.     Breath sounds: No stridor.  Abdominal:     General: There is no distension.  Musculoskeletal:       Arms:  Skin:    General: Skin is warm and dry.  Neurological:     Mental Status: He is alert and oriented to person, place, and time.     (all labs ordered are listed, but only abnormal results are displayed)  Labs Reviewed - No data to display  EKG: None  Radiology: DG Wrist Complete Left Result Date: 10/21/2023 CLINICAL DATA:  Tripped and fell 2 days ago.  Wrist pain. EXAM: LEFT WRIST - COMPLETE 3+ VIEW COMPARISON:  None Available. FINDINGS: No evidence for an acute fracture or dislocation. Advanced degenerative changes are seen in the radiocarpal joint, first carpometacarpal joint, and in the ulnar carpus. Calcification of the TFCC evident. Degenerative changes are seen in the MCP joint of the thumb and index finger. IMPRESSION: 1. No acute bony findings. 2. Advanced degenerative changes in the wrist. Electronically Signed   By: Camellia Candle M.D.   On: 10/21/2023 08:38     Procedures   Medications Ordered in the ED  acetaminophen  (TYLENOL ) tablet 1,000 mg (has no administration in  time range)                                    Medical Decision Making Elderly male presents 2 days after fall with ongoing left wrist pain.  Denies other complaints reassuring, patient is distally neurovascular intact, does have tenderness palpation concern for soft tissue injury versus fracture.  Amount and/or Complexity of Data Reviewed Radiology: independent interpretation performed. Decision-making details documented in ED Course.  Risk OTC drugs.   8:51 AM X-ray reviewed consistent with no fracture, wrist brace applied secondary to pain, Tylenol , ibuprofen  appropriate for this patient. Absent other complaints he is appropriate for discharge with outpatient Ortho follow-up as needed.     Final diagnoses:  Fall, initial encounter  Left wrist pain    ED Discharge Orders     None          Garrick Charleston, MD 10/21/23 [phone]

## 2023-11-04 ENCOUNTER — Ambulatory Visit: Admitting: Surgical

## 2023-11-11 ENCOUNTER — Other Ambulatory Visit: Payer: Self-pay

## 2023-11-11 ENCOUNTER — Emergency Department (HOSPITAL_COMMUNITY)
Admission: EM | Admit: 2023-11-11 | Discharge: 2023-11-11 | Disposition: A | Discharge status: Home / Self Care | Attending: Emergency Medicine | Admitting: Emergency Medicine

## 2023-11-11 ENCOUNTER — Encounter (HOSPITAL_COMMUNITY): Payer: Self-pay

## 2023-11-11 DIAGNOSIS — Z79899 Other long term (current) drug therapy: Secondary | ICD-10-CM | POA: Diagnosis not present

## 2023-11-11 DIAGNOSIS — Z8546 Personal history of malignant neoplasm of prostate: Secondary | ICD-10-CM | POA: Diagnosis not present

## 2023-11-11 DIAGNOSIS — I482 Chronic atrial fibrillation, unspecified: Secondary | ICD-10-CM | POA: Diagnosis not present

## 2023-11-11 DIAGNOSIS — I10 Essential (primary) hypertension: Secondary | ICD-10-CM | POA: Insufficient documentation

## 2023-11-11 DIAGNOSIS — D72819 Decreased white blood cell count, unspecified: Secondary | ICD-10-CM | POA: Insufficient documentation

## 2023-11-11 DIAGNOSIS — Z85038 Personal history of other malignant neoplasm of large intestine: Secondary | ICD-10-CM | POA: Diagnosis not present

## 2023-11-11 DIAGNOSIS — R195 Other fecal abnormalities: Secondary | ICD-10-CM | POA: Insufficient documentation

## 2023-11-11 LAB — URINALYSIS, ROUTINE W REFLEX MICROSCOPIC
Bilirubin Urine: NEGATIVE
Glucose, UA: NEGATIVE mg/dL
Ketones, ur: NEGATIVE mg/dL
Nitrite: POSITIVE — AB
Protein, ur: NEGATIVE mg/dL
Specific Gravity, Urine: 1.011 (ref 1.005–1.030)
pH: 6 (ref 5.0–8.0)

## 2023-11-11 LAB — CBC WITH DIFFERENTIAL/PLATELET
Abs Immature Granulocytes: 0.01 K/uL (ref 0.00–0.07)
Basophils Absolute: 0 K/uL (ref 0.0–0.1)
Basophils Relative: 1 %
Eosinophils Absolute: 0.2 K/uL (ref 0.0–0.5)
Eosinophils Relative: 7 %
HCT: 34.3 % — ABNORMAL LOW (ref 39.0–52.0)
Hemoglobin: 11.2 g/dL — ABNORMAL LOW (ref 13.0–17.0)
Immature Granulocytes: 0 %
Lymphocytes Relative: 51 %
Lymphs Abs: 1.6 K/uL (ref 0.7–4.0)
MCH: 33.9 pg (ref 26.0–34.0)
MCHC: 32.7 g/dL (ref 30.0–36.0)
MCV: 103.9 fL — ABNORMAL HIGH (ref 80.0–100.0)
Monocytes Absolute: 0.3 K/uL (ref 0.1–1.0)
Monocytes Relative: 9 %
Neutro Abs: 1 K/uL — ABNORMAL LOW (ref 1.7–7.7)
Neutrophils Relative %: 32 %
Platelets: 226 K/uL (ref 150–400)
RBC: 3.3 MIL/uL — ABNORMAL LOW (ref 4.22–5.81)
RDW: 13.2 % (ref 11.5–15.5)
Smear Review: NORMAL
WBC: 3.2 K/uL — ABNORMAL LOW (ref 4.0–10.5)
nRBC: 0 % (ref 0.0–0.2)

## 2023-11-11 LAB — COMPREHENSIVE METABOLIC PANEL WITH GFR
ALT: 11 U/L (ref 0–44)
AST: 28 U/L (ref 15–41)
Albumin: 3.4 g/dL — ABNORMAL LOW (ref 3.5–5.0)
Alkaline Phosphatase: 54 U/L (ref 38–126)
Anion gap: 12 (ref 5–15)
BUN: 17 mg/dL (ref 8–23)
CO2: 20 mmol/L — ABNORMAL LOW (ref 22–32)
Calcium: 9.3 mg/dL (ref 8.9–10.3)
Chloride: 105 mmol/L (ref 98–111)
Creatinine, Ser: 1.12 mg/dL (ref 0.61–1.24)
GFR, Estimated: >60 mL/min (ref >60)
Glucose, Bld: 86 mg/dL (ref 70–99)
Potassium: 4.6 mmol/L (ref 3.5–5.1)
Sodium: 137 mmol/L (ref 135–145)
Total Bilirubin: 1 mg/dL (ref 0.0–1.2)
Total Protein: 7.3 g/dL (ref 6.5–8.1)

## 2023-11-11 LAB — TYPE AND SCREEN
ABO/RH(D): O POS
Antibody Screen: NEGATIVE

## 2023-11-11 LAB — POC OCCULT BLOOD, ED: Fecal Occult Blood: NEGATIVE

## 2023-11-11 LAB — LIPASE, BLOOD: Lipase: 33 U/L (ref 11–51)

## 2023-11-11 NOTE — ED Notes (Addendum)
 Blood labs resulted, pending urine results.

## 2023-11-11 NOTE — ED Triage Notes (Signed)
 Pt arrived via POV from home with his son for concern of abdominal pain and bloody stool X 3 days. Pt reports Hx of bleeding stomach ulcers and previous surgery.

## 2023-11-11 NOTE — ED Provider Notes (Signed)
 [facility] AT [facility] Provider Note   CSN: [id number] Arrival date & time: 11/11/23  9096     Patient presents with: Hematochezia   [facility] Long is a 89 y.o. male.   HPI Patient with previous history of GI bleed.  Presents with black stool.  Previous history of colon cancer.  Previous history of upper GI bleed.  Previously been scoped with upper GI cause of bleeding.  Around 4 days ago started to have black stool again.  Not on iron.  Has been feeling more fatigued.  Previously been on and out coagulation but not currently on it.   Past Medical History:  Diagnosis Date   Acid reflux    Acute colitis 09/16/2019   AKI (acute kidney injury) 07/31/2012   Anemia    BENIGN PROSTATIC HYPERTROPHY, WITH OBSTRUCTION 12/18/2005   Qualifier: Diagnosis of  By: Olene Dodrill     Chest pain 07/31/2012   Colitis 09/16/2019   Colon cancer (HCC)    per patient, colon cancer more than 10 years ago s/p right hemicolectomy   Elevated bilirubin    Elevated LFTs    Ex-cigarette smoker    Gout    Hyperlipemia    Hypertension    Hyponatremia 08/01/2021   Intractable abdominal pain 02/03/2021   LATERAL MENISCUS TEAR, RIGHT 04/19/2006   Qualifier: Diagnosis of  By: Karren MD, Cornelius     Leukocytosis, unspecified 07/31/2012   Paroxysmal atrial fibrillation (HCC) 2014   Presence of permanent cardiac pacemaker    Prostate cancer (HCC) 08/08/2013   SEEDS 20 YEARS AGO   Prostate disease    RENAL CALCULUS 12/18/2005   Qualifier: Diagnosis of  By: Olene Dodrill     RUQ pain    UTI (urinary tract infection) 03/26/2021    Prior to Admission medications   Medication Sig Start Date End Date Taking? Authorizing Provider  acetaminophen  (TYLENOL ) 650 MG CR tablet Take 1,300 mg by mouth every 8 (eight) hours as needed for pain.    [provider]  amLODipine  (NORVASC ) 10 MG tablet Take 1 tablet by mouth daily. 02/13/22   [provider]  carvedilol  (COREG ) 3.125 MG tablet Take 3.125 mg by mouth 2 (two) times daily with a meal.    [provider]  diclofenac  sodium (VOLTAREN ) 1 % GEL Apply 2 g topically 4 (four) times daily. 06/19/17   Rancour, Garnette, MD  Ensure (ENSURE) Take 237 mLs by mouth daily as needed (nutrition). Vanilla or chocolate    [provider]  ferrous sulfate  324 MG TBEC Take 324 mg by mouth. Takes one tablet every other day    [provider]  furosemide  (LASIX ) 20 MG tablet Take 20 mg by mouth. One half tablet daily 12/04/21   [provider]  HYDROcodone -acetaminophen  (NORCO/VICODIN) 5-325 MG tablet Take 1 tablet by mouth every 4 (four) hours as needed. 09/04/23   Dean Clarity, MD  HYDROcodone -acetaminophen  (NORCO/VICODIN) 5-325 MG tablet Take 2 tablets by mouth every 6 (six) hours as needed for severe pain (pain score 7-10). 10/21/23   [facility] Charleston, MD  lisinopril (ZESTRIL) 10 MG tablet Take 1 tablet by mouth daily. 03/06/22   [provider]  ondansetron  (ZOFRAN -ODT) 4 MG disintegrating tablet Take 1 tablet (4 mg total) by mouth every 8 (eight) hours as needed for nausea or vomiting. 10/21/23   Garrick Charleston, MD  pantoprazole  (PROTONIX ) 40 MG tablet TAKE 1 TABLET BY MOUTH ONCE DAILY. 08/02/23   Mariette Cree  L, NP  tamsulosin  (FLOMAX ) 0.4 MG CAPS capsule Take 1 capsule (0.4 mg total) by mouth 2 (two) times daily. 09/15/23   McKenzie, Belvie CROME, MD  traMADol  (ULTRAM ) 50 MG tablet Take 1 tablet (50 mg total) by mouth at bedtime as needed. 03/25/22   Magnant, Carlin CROME, PA-C    Allergies: Patient has no known allergies.    Review of Systems  Updated Vital Signs BP (!) 166/95   Pulse 60   Temp 98.1 F (36.7 C) (Oral)   Resp 16   Ht 6' (1.829 m)   Wt 79.4 kg   SpO2 98%   BMI 23.73 kg/m   Physical Exam Vitals and nursing note reviewed.  Cardiovascular:     Rate and Rhythm: Regular rhythm.  Pulmonary:     Breath sounds: No wheezing.   Abdominal:     Tenderness: There is no abdominal tenderness.  Musculoskeletal:        General: No tenderness.  Skin:    Coloration: Skin is not pale.  Neurological:     Mental Status: He is alert. Mental status is at baseline.     (all labs ordered are listed, but only abnormal results are displayed) Labs Reviewed  COMPREHENSIVE METABOLIC PANEL WITH GFR - Abnormal; Notable for the following components:      Result Value   CO2 20 (*)    Albumin 3.4 (*)    All other components within normal limits  URINALYSIS, ROUTINE W REFLEX MICROSCOPIC - Abnormal; Notable for the following components:   APPearance HAZY (*)    Hgb urine dipstick SMALL (*)    Nitrite POSITIVE (*)    Leukocytes,Ua TRACE (*)    Bacteria, UA RARE (*)    All other components within normal limits  CBC WITH DIFFERENTIAL/PLATELET - Abnormal; Notable for the following components:   WBC 3.2 (*)    RBC 3.30 (*)    Hemoglobin 11.2 (*)    HCT 34.3 (*)    MCV 103.9 (*)    Neutro Abs 1.0 (*)    All other components within normal limits  LIPASE, BLOOD  POC OCCULT BLOOD, ED  TYPE AND SCREEN    EKG: None  Radiology: No results found.   Procedures   Medications Ordered in the ED - No data to display                                  Medical Decision Making Amount and/or Complexity of Data Reviewed Labs: ordered.   Patient with black stool.  Differential diagnosis includes upper GI bleed.  Will get hemoglobin.  Reviewed previous GI note.  Will check Hemoccult also.  Hemoccult was negative.  Hemoglobin is at baseline.  I think patient is stable for discharge home and follow-up as an outpatient.  No GI bleed seen.  Discharge.      Final diagnoses:  Dark stools    ED Discharge Orders     None          [facility] Lot, MD 11/11/23 1220

## 2023-11-11 NOTE — Discharge Instructions (Signed)
 The workup was reassuring today.  Your hemoglobin is at baseline and there was no blood in the stool rechecked.  Follow-up with your doctors as needed.

## 2023-11-11 NOTE — ED Notes (Addendum)
 Son at [facility], no changes, alert, NAD, calm.

## 2023-11-15 ENCOUNTER — Telehealth (INDEPENDENT_AMBULATORY_CARE_PROVIDER_SITE_OTHER): Payer: Self-pay

## 2023-11-15 ENCOUNTER — Ambulatory Visit

## 2023-11-15 ENCOUNTER — Telehealth: Payer: Self-pay | Admitting: Urology

## 2023-11-15 DIAGNOSIS — R399 Unspecified symptoms and signs involving the genitourinary system: Secondary | ICD-10-CM

## 2023-11-15 DIAGNOSIS — N390 Urinary tract infection, site not specified: Secondary | ICD-10-CM

## 2023-11-15 NOTE — Telephone Encounter (Signed)
 Patient called into the office today with general questions/concerns regarding UTI. Went to ER and they did not prescribe anything for the infection. Patient may be reached at [phone] to discuss questions.

## 2023-11-15 NOTE — Telephone Encounter (Signed)
 Dysuria  Patient called with c/o dysuria x 2-3 days days.  Pain: 0  Severity:0  Associated Signs and Symptoms:  Fever: noTemp.0 Chills: no Hematuria: no Urgency: yes Frequency: yes Hesitancy:no Incontinence: no Nausea: no Vomiting: no  Urologic History:  Any Recent Urologic Surgeries or Procedures:no Recurrent UTI's:no Cystitis: no  Prostatitis:no Kidney or Bladder Stones: no Plan: Walk-in Clinic: no Appointment w/Physician: [no Lab visit scheduled for urine drop off: Yes Advice given:  Do you take on daily medications for UTI suppression No

## 2023-11-15 NOTE — Telephone Encounter (Signed)
 Patient daughter called today stating her father went to the Ed on 11/11/2023 due to dark stools and abdominal pain. Patient is having some lower peri umbilical pain. Patient denies any sight of bright red blood per rectum.Patient does not have and nausea,vomiting, fevers, no other current Gi related issues. Patient is taking Ferrous sulfate  every other day. Had a history of prostate and Colon cancer. Patient urine did show a possible UTI and I asked the patient daughter to reach out to PCP regarding this as the abdominal pain could be coming from this, as the patient was positive for Nitrates and had trace Leukocytes, and had small amount of Hgb in the urine also. Patient Hgb on 11/11/2023 was 11.2. Patient uses [facility]. Please advise.

## 2023-11-15 NOTE — Telephone Encounter (Signed)
 His hemoglobin looks good, actually improved from prior labs and stool was negative for blood in the ED, dark stools likely secondary to ferrous sulfate . Agree with recommendations on following up with PCP or urologist regarding presence of UTI as this is likely the cause of his lower abdominal pain though he should let us  know about any rectal bleeding or no improvement in his abdominal pain once treated for UTI. I spoke with the patient daughter Alfonso, and made her aware of all. She states understanding and says she has reached out to patient's urologist and is awaiting a return call from them regarding possible UTI.

## 2023-11-16 LAB — URINALYSIS, ROUTINE W REFLEX MICROSCOPIC
Bilirubin, UA: NEGATIVE
Glucose, UA: NEGATIVE
Ketones, UA: NEGATIVE
Nitrite, UA: POSITIVE — AB
Protein,UA: NEGATIVE
RBC, UA: NEGATIVE
Specific Gravity, UA: 1.01 (ref 1.005–1.030)
Urobilinogen, Ur: 0.2 mg/dL (ref 0.2–1.0)
pH, UA: 6 (ref 5.0–7.5)

## 2023-11-16 LAB — MICROSCOPIC EXAMINATION

## 2023-11-16 MED ORDER — DOXYCYCLINE HYCLATE 100 MG PO CAPS: 100.0000 mg | ORAL_CAPSULE | Freq: Two times a day (BID) | ORAL | 0 refills | Status: DC

## 2023-11-16 NOTE — Telephone Encounter (Signed)
 Daughter made aware of doxycyline 100 mg po bid x 7 days sent in to pharmacy due to possible uti. Urine culture still pending.

## 2023-11-17 LAB — URINE CULTURE

## 2023-11-17 MED ORDER — SULFAMETHOXAZOLE-TRIMETHOPRIM 800-160 MG PO TABS: 1.0000 | ORAL_TABLET | Freq: Two times a day (BID) | ORAL | 0 refills | Status: DC

## 2023-11-17 NOTE — Telephone Encounter (Signed)
 Pt daughter call in a state's pt is sick on the stomach after taking doxycycline . Daughter state's pt is eating prior to take medication. Pt/Daughter would like an alternative ABT. Aware a message will be sent to MD, McKenzie on advisement. Verbal from MD, McKenzie to change ABT to Bactrim  [phone] po BID X 7 days

## 2023-11-17 NOTE — Addendum Note (Signed)
 Addended by: GRETTA MASTERS R on: 11/17/2023 09:05 AM   Modules accepted: Orders

## 2023-11-17 NOTE — Telephone Encounter (Signed)
 Open in error

## 2023-11-24 MED ORDER — SULFAMETHOXAZOLE-TRIMETHOPRIM 800-160 MG PO TABS
1.0000 | ORAL_TABLET | Freq: Two times a day (BID) | ORAL | 0 refills | Status: DC
Start: 2023-11-24 — End: 2023-12-06

## 2023-11-24 NOTE — Addendum Note (Signed)
 Addended by: MALACHY SLICE on: 11/24/2023 08:40 AM   Modules accepted: Orders

## 2023-11-24 NOTE — Telephone Encounter (Signed)
 Daughter called and made aware of the extra 7 day course of Bactrim  sent in per Dr. Sherrilee.   Daughter states that patient in currently admitted to the [facility] hospital but will let them know about the additional week of Bactrim .

## 2023-12-01 ENCOUNTER — Encounter (INDEPENDENT_AMBULATORY_CARE_PROVIDER_SITE_OTHER): Payer: Self-pay | Admitting: Gastroenterology

## 2023-12-05 ENCOUNTER — Encounter (HOSPITAL_COMMUNITY): Payer: Self-pay | Admitting: *Deleted

## 2023-12-05 ENCOUNTER — Other Ambulatory Visit: Payer: Self-pay

## 2023-12-05 ENCOUNTER — Emergency Department (HOSPITAL_COMMUNITY)

## 2023-12-05 ENCOUNTER — Inpatient Hospital Stay (HOSPITAL_COMMUNITY)
Admission: EM | Admit: 2023-12-05 | Discharge: 2023-12-10 | DRG: 641 | Disposition: A | Discharge status: Home Health | Attending: Family Medicine | Admitting: Family Medicine

## 2023-12-05 DIAGNOSIS — R109 Unspecified abdominal pain: Secondary | ICD-10-CM | POA: Diagnosis not present

## 2023-12-05 DIAGNOSIS — Z66 Do not resuscitate: Secondary | ICD-10-CM | POA: Diagnosis present

## 2023-12-05 DIAGNOSIS — D631 Anemia in chronic kidney disease: Secondary | ICD-10-CM | POA: Diagnosis present

## 2023-12-05 DIAGNOSIS — Z79899 Other long term (current) drug therapy: Secondary | ICD-10-CM

## 2023-12-05 DIAGNOSIS — I482 Chronic atrial fibrillation, unspecified: Secondary | ICD-10-CM | POA: Diagnosis not present

## 2023-12-05 DIAGNOSIS — C61 Malignant neoplasm of prostate: Secondary | ICD-10-CM | POA: Diagnosis not present

## 2023-12-05 DIAGNOSIS — R9431 Abnormal electrocardiogram [ECG] [EKG]: Secondary | ICD-10-CM | POA: Diagnosis not present

## 2023-12-05 DIAGNOSIS — I10 Essential (primary) hypertension: Secondary | ICD-10-CM

## 2023-12-05 DIAGNOSIS — D638 Anemia in other chronic diseases classified elsewhere: Secondary | ICD-10-CM | POA: Diagnosis not present

## 2023-12-05 DIAGNOSIS — R531 Weakness: Secondary | ICD-10-CM | POA: Diagnosis not present

## 2023-12-05 DIAGNOSIS — Z7189 Other specified counseling: Secondary | ICD-10-CM | POA: Diagnosis not present

## 2023-12-05 DIAGNOSIS — K5900 Constipation, unspecified: Secondary | ICD-10-CM | POA: Diagnosis present

## 2023-12-05 DIAGNOSIS — Z9049 Acquired absence of other specified parts of digestive tract: Secondary | ICD-10-CM | POA: Diagnosis not present

## 2023-12-05 DIAGNOSIS — E875 Hyperkalemia: Principal | ICD-10-CM | POA: Diagnosis present

## 2023-12-05 DIAGNOSIS — C7491 Malignant neoplasm of unspecified part of right adrenal gland: Secondary | ICD-10-CM | POA: Diagnosis present

## 2023-12-05 DIAGNOSIS — Z950 Presence of cardiac pacemaker: Secondary | ICD-10-CM | POA: Diagnosis not present

## 2023-12-05 DIAGNOSIS — I129 Hypertensive chronic kidney disease with stage 1 through stage 4 chronic kidney disease, or unspecified chronic kidney disease: Secondary | ICD-10-CM | POA: Diagnosis present

## 2023-12-05 DIAGNOSIS — N179 Acute kidney failure, unspecified: Secondary | ICD-10-CM | POA: Diagnosis present

## 2023-12-05 DIAGNOSIS — D539 Nutritional anemia, unspecified: Secondary | ICD-10-CM | POA: Diagnosis present

## 2023-12-05 DIAGNOSIS — N401 Enlarged prostate with lower urinary tract symptoms: Secondary | ICD-10-CM | POA: Diagnosis present

## 2023-12-05 DIAGNOSIS — E860 Dehydration: Secondary | ICD-10-CM | POA: Diagnosis present

## 2023-12-05 DIAGNOSIS — K219 Gastro-esophageal reflux disease without esophagitis: Secondary | ICD-10-CM | POA: Diagnosis present

## 2023-12-05 DIAGNOSIS — R338 Other retention of urine: Secondary | ICD-10-CM | POA: Diagnosis present

## 2023-12-05 DIAGNOSIS — Z789 Other specified health status: Secondary | ICD-10-CM | POA: Diagnosis not present

## 2023-12-05 DIAGNOSIS — I959 Hypotension, unspecified: Secondary | ICD-10-CM | POA: Diagnosis present

## 2023-12-05 DIAGNOSIS — E871 Hypo-osmolality and hyponatremia: Secondary | ICD-10-CM | POA: Diagnosis present

## 2023-12-05 DIAGNOSIS — Z515 Encounter for palliative care: Secondary | ICD-10-CM | POA: Diagnosis not present

## 2023-12-05 DIAGNOSIS — R339 Retention of urine, unspecified: Secondary | ICD-10-CM | POA: Insufficient documentation

## 2023-12-05 DIAGNOSIS — D72819 Decreased white blood cell count, unspecified: Secondary | ICD-10-CM

## 2023-12-05 DIAGNOSIS — D61818 Other pancytopenia: Secondary | ICD-10-CM | POA: Diagnosis present

## 2023-12-05 DIAGNOSIS — N184 Chronic kidney disease, stage 4 (severe): Secondary | ICD-10-CM | POA: Diagnosis present

## 2023-12-05 DIAGNOSIS — Z8546 Personal history of malignant neoplasm of prostate: Secondary | ICD-10-CM

## 2023-12-05 DIAGNOSIS — I480 Paroxysmal atrial fibrillation: Secondary | ICD-10-CM | POA: Diagnosis present

## 2023-12-05 DIAGNOSIS — E785 Hyperlipidemia, unspecified: Secondary | ICD-10-CM | POA: Diagnosis present

## 2023-12-05 DIAGNOSIS — C7971 Secondary malignant neoplasm of right adrenal gland: Secondary | ICD-10-CM | POA: Diagnosis not present

## 2023-12-05 DIAGNOSIS — D630 Anemia in neoplastic disease: Secondary | ICD-10-CM | POA: Diagnosis present

## 2023-12-05 DIAGNOSIS — R4589 Other symptoms and signs involving emotional state: Secondary | ICD-10-CM | POA: Diagnosis not present

## 2023-12-05 DIAGNOSIS — Z85038 Personal history of other malignant neoplasm of large intestine: Secondary | ICD-10-CM

## 2023-12-05 DIAGNOSIS — Z87891 Personal history of nicotine dependence: Secondary | ICD-10-CM

## 2023-12-05 LAB — URINALYSIS, ROUTINE W REFLEX MICROSCOPIC
Bilirubin Urine: NEGATIVE
Glucose, UA: NEGATIVE mg/dL
Hgb urine dipstick: NEGATIVE
Ketones, ur: NEGATIVE mg/dL
Leukocytes,Ua: NEGATIVE
Nitrite: NEGATIVE
Protein, ur: NEGATIVE mg/dL
Specific Gravity, Urine: 1.01 (ref 1.005–1.030)
pH: 5 (ref 5.0–8.0)

## 2023-12-05 LAB — CBC WITH DIFFERENTIAL/PLATELET
Basophils Absolute: 0 K/uL (ref 0.0–0.1)
Basophils Relative: 0 %
Eosinophils Absolute: 0.3 K/uL (ref 0.0–0.5)
Eosinophils Relative: 10 %
HCT: 31.1 % — ABNORMAL LOW (ref 39.0–52.0)
Hemoglobin: 10.2 g/dL — ABNORMAL LOW (ref 13.0–17.0)
Lymphocytes Relative: 65 %
Lymphs Abs: 2 K/uL (ref 0.7–4.0)
MCH: 33.9 pg (ref 26.0–34.0)
MCHC: 32.8 g/dL (ref 30.0–36.0)
MCV: 103.3 fL — ABNORMAL HIGH (ref 80.0–100.0)
Monocytes Absolute: 0.1 K/uL (ref 0.1–1.0)
Monocytes Relative: 4 %
Neutro Abs: 0.6 K/uL — ABNORMAL LOW (ref 1.7–7.7)
Neutrophils Relative %: 21 %
Platelets: 282 K/uL (ref 150–400)
RBC: 3.01 MIL/uL — ABNORMAL LOW (ref 4.22–5.81)
RDW: 13 % (ref 11.5–15.5)
Smear Review: NORMAL
WBC: 3 K/uL — ABNORMAL LOW (ref 4.0–10.5)
nRBC: 0 % (ref 0.0–0.2)

## 2023-12-05 LAB — COMPREHENSIVE METABOLIC PANEL WITH GFR
ALT: 7 U/L (ref 0–44)
AST: 19 U/L (ref 15–41)
Albumin: 4 g/dL (ref 3.5–5.0)
Alkaline Phosphatase: 62 U/L (ref 38–126)
Anion gap: 10 (ref 5–15)
BUN: 36 mg/dL — ABNORMAL HIGH (ref 8–23)
CO2: 20 mmol/L — ABNORMAL LOW (ref 22–32)
Calcium: 9.5 mg/dL (ref 8.9–10.3)
Chloride: 100 mmol/L (ref 98–111)
Creatinine, Ser: 2.71 mg/dL — ABNORMAL HIGH (ref 0.61–1.24)
GFR, Estimated: 21 mL/min — ABNORMAL LOW (ref >60)
Glucose, Bld: 98 mg/dL (ref 70–99)
Potassium: 6.2 mmol/L — ABNORMAL HIGH (ref 3.5–5.1)
Sodium: 130 mmol/L — ABNORMAL LOW (ref 135–145)
Total Bilirubin: 0.5 mg/dL (ref 0.0–1.2)
Total Protein: 7.5 g/dL (ref 6.5–8.1)

## 2023-12-05 LAB — MAGNESIUM: Magnesium: 2.1 mg/dL (ref 1.7–2.4)

## 2023-12-05 MED ORDER — SODIUM CHLORIDE 0.9 % IV BOLUS
1000.0000 mL | Freq: Once | INTRAVENOUS | Status: AC
  Administered 2023-12-05: 1000 mL via INTRAVENOUS

## 2023-12-05 MED ORDER — SODIUM CHLORIDE 0.9 % IV BOLUS
500.0000 mL | Freq: Once | INTRAVENOUS | Status: AC
  Administered 2023-12-05: 500 mL via INTRAVENOUS

## 2023-12-05 MED ORDER — CALCIUM GLUCONATE 10 % IV SOLN
1.0000 g | Freq: Once | INTRAVENOUS | Status: AC
  Administered 2023-12-05: 1 g via INTRAVENOUS
  Filled 2023-12-05: qty 10

## 2023-12-05 MED ORDER — SODIUM ZIRCONIUM CYCLOSILICATE 5 G PO PACK
5.0000 g | PACK | ORAL | Status: AC
  Administered 2023-12-05: 5 g via ORAL
  Filled 2023-12-05: qty 1

## 2023-12-05 NOTE — ED Provider Notes (Signed)
 [place] EM[facility]Y DE[facility]MENT AT [facility] Provider Note   CSN: [id number] Arrival date & time: 12/05/23  1710     Patient presents with: Abdominal Pain   [facility] Long is a 89 y.o. male.  {Add pertinent medical, surgical, social history, OB history to HPI:32947}  Abdominal Pain  This patient is a 89 year old male, he has a history of recently being admitted to the [facility] in [facility] for abdominal pain and constipation.  The patient and his son who is an additional historian are able to report that while he was there they were giving him laxatives, he finally had a bowel movement and was discharged home.  Since that time he has had frequent loose or runny stools which has been frustrating him, he has had some abdominal discomfort with it and has noticed some low blood pressure.  No vomiting, no fever, no shortness of breath.    Prior to Admission medications   Medication Sig Start Date End Date Taking? Authorizing Provider  acetaminophen  (TYLENOL ) 650 MG CR tablet Take 1,300 mg by mouth every 8 (eight) hours as needed for pain.    [provider]  amLODipine  (NORVASC ) 10 MG tablet Take 1 tablet by mouth daily. 02/13/22   [provider]  carvedilol  (COREG ) 3.125 MG tablet Take 3.125 mg by mouth 2 (two) times daily with a meal.    [provider]  diclofenac  sodium (VOLTAREN ) 1 % GEL Apply 2 g topically 4 (four) times daily. 06/19/17   Rancour, Garnette, MD  Ensure (ENSURE) Take 237 mLs by mouth daily as needed (nutrition). Vanilla or chocolate    [provider]  ferrous sulfate  324 MG TBEC Take 324 mg by mouth. Takes one tablet every other day    [provider]  furosemide  (LASIX ) 20 MG tablet Take 20 mg by mouth. One half tablet daily 12/04/21   [provider]  HYDROcodone -acetaminophen  (NORCO/VICODIN) 5-325 MG tablet Take 1 tablet by mouth every 4 (four) hours as needed. 09/04/23   Dean Clarity, MD   HYDROcodone -acetaminophen  (NORCO/VICODIN) 5-325 MG tablet Take 2 tablets by mouth every 6 (six) hours as needed for severe pain (pain score 7-10). 10/21/23   Garrick Charleston, MD  lisinopril (ZESTRIL) 10 MG tablet Take 1 tablet by mouth daily. 03/06/22   [provider]  ondansetron  (ZOFRAN -ODT) 4 MG disintegrating tablet Take 1 tablet (4 mg total) by mouth every 8 (eight) hours as needed for nausea or vomiting. 10/21/23   Garrick Charleston, MD  pantoprazole  (PROTONIX ) 40 MG tablet TAKE 1 TABLET BY MOUTH ONCE DAILY. 08/02/23   Carlan, Chelsea L, NP  sulfamethoxazole -trimethoprim  (BACTRIM  DS) 800-160 MG tablet Take 1 tablet by mouth every 12 (twelve) hours. 11/24/23   McKenzie, Belvie CROME, MD  tamsulosin  (FLOMAX ) 0.4 MG CAPS capsule Take 1 capsule (0.4 mg total) by mouth 2 (two) times daily. 09/15/23   McKenzie, Belvie CROME, MD  traMADol  (ULTRAM ) 50 MG tablet Take 1 tablet (50 mg total) by mouth at bedtime as needed. 03/25/22   Magnant, Carlin CROME, PA-C    Allergies: Patient has no known allergies.    Review of Systems  Gastrointestinal:  Positive for abdominal pain.  All other systems reviewed and are negative.   Updated Vital Signs BP 99/72 (BP Location: Right Arm)   Pulse 70   Temp 97.9 F (36.6 C) (Oral)   Resp 17   Wt 79.4 kg   SpO2 95%   BMI 23.73 kg/m   Physical Exam Vitals  and nursing note reviewed.  Constitutional:      General: He is not in acute distress.    Appearance: He is well-developed.  HENT:     Head: Normocephalic and atraumatic.     Mouth/Throat:     Pharynx: No oropharyngeal exudate.  Eyes:     General: No scleral icterus.       Right eye: No discharge.        Left eye: No discharge.     Conjunctiva/sclera: Conjunctivae normal.     Pupils: Pupils are equal, round, and reactive to light.  Neck:     Thyroid: No thyromegaly.     Vascular: No JVD.  Cardiovascular:     Rate and Rhythm: Normal rate and regular rhythm.     Heart sounds: Normal heart sounds. No  murmur heard.    No friction rub. No gallop.  Pulmonary:     Effort: Pulmonary effort is normal. No respiratory distress.     Breath sounds: Normal breath sounds. No wheezing or rales.  Abdominal:     General: Bowel sounds are normal. There is no distension.     Palpations: Abdomen is soft. There is no mass.     Tenderness: There is no abdominal tenderness.     Comments: Abdomen is nontender, prior surgical scar from prior partial colectomy  Musculoskeletal:        General: No tenderness. Normal range of motion.     Cervical back: Normal range of motion and neck supple.     Right lower leg: No edema.     Left lower leg: No edema.  Lymphadenopathy:     Cervical: No cervical adenopathy.  Skin:    General: Skin is warm and dry.     Findings: No erythema or rash.  Neurological:     Mental Status: He is alert.     Coordination: Coordination normal.  Psychiatric:        Behavior: Behavior normal.     (all labs ordered are listed, but only abnormal results are displayed) Labs Reviewed  CBC WITH DIFFERENTIAL/PLATELET  BASIC METABOLIC PANEL WITH GFR    EKG: None  Radiology: No results found.  {Document cardiac monitor, telemetry assessment procedure when appropriate:32947} Procedures   Medications Ordered in the ED - No data to display    {Click here for ABCD2, HEART and other calculators REFRESH Note before signing:1}                              Medical Decision Making Amount and/or Complexity of Data Reviewed Labs: ordered.    This patient presents to the ED for concern of abdominal pain and diarrhea, this involves an extensive number of treatment options, and is a complaint that carries with it a high risk of complications and morbidity.  The differential diagnosis includes medication related is the most likely answer, abdominal discomfort seems to be coming in waves with the bowel movement suggestive of a intestinal colic, no recent antibiotics, no fever, patient had  a stool on arrival which was thin but not watery and nonbloody   Co morbidities / Chronic conditions that complicate the patient evaluation  The patient is elderly, poor historian   Additional history obtained:  Additional history obtained from EMR External records from outside source obtained and reviewed including medical record, patient admitted to the hospital last week, unfortunately no other labs or imaging results available   Lab Tests:  I  Ordered, and personally interpreted labs.  The pertinent results include:  ***   Imaging Studies ordered:  I ordered imaging studies including ***  I independently visualized and interpreted imaging which showed *** I agree with the radiologist interpretation   Cardiac Monitoring: / EKG:  The patient was maintained on a cardiac monitor.  I personally viewed and interpreted the cardiac monitored which showed an underlying rhythm of: ***   Problem List / ED Course / Critical interventions / Medication management  *** I ordered medication including ***   Reevaluation of the patient after these medicines showed that the patient *** I have reviewed the patients home medicines and have made adjustments as needed   Consultations Obtained:  I requested consultation with the ***,  and discussed lab and imaging findings as well as pertinent plan - they recommend: ***   Social Determinants of Health:  ***   Test / Admission - Considered:  ***   {Document critical care time when appropriate  Document review of labs and clinical decision tools ie CHADS2VASC2, etc  Document your independent review of radiology images and any outside records  Document your discussion with family members, caretakers and with consultants  Document social determinants of health affecting pt's care  Document your decision making why or why not admission, treatments were needed:32947:::1}   Final diagnoses:  None    ED Discharge Orders     None

## 2023-12-05 NOTE — ED Triage Notes (Addendum)
 BIB son from home for abd cramping and diarrhea. Describes as continued, recurrent. Seen here for the same previously, and admitted in the [facility] in [place] last week for the same. Returns for continued sx. Stool watery and black. States, But, they did not find any blood. Was constipated at the [facility]. Takes iron. Alert, NAD, calm, interactive, resps e/u, speaking in clear complete sentences.

## 2023-12-05 NOTE — H&P (Incomplete)
 History and Physical    Patient: Derrick Long FMW:[id number] DOB: October 18, 1926 DOA: 12/05/2023 DOS: the patient was seen and examined on 12/06/2023 PCP: Estefana Doffing, MD  Patient coming from: Home  Chief Complaint:  Chief Complaint  Patient presents with   Abdominal Pain   HPI: Derrick Long is a 89 y.o. 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 Rogers) who presents emergency department due to abdominal pain.  Patient was recently admitted to the [facility] hospital in [place] due to abdominal pain and constipation treated with laxatives after which patient had a bowel movement and was discharged home.  However he has been having loose stools and some abdominal discomfort.  He denies fever, shortness of breath, vomiting.  ED Course:  In the emergency department, BP was 99/72, other vital signs were within normal range.  Workup in the ED showed WBC of 3.0, hemoglobin 10.2, hematocrit 31.1, MCV 103.3.  BMP showed sodium 130, potassium 6.2, chloride 100, bicarb 20, blood glucose 98, BUN 36, creatinine 2.71 (baseline creatinine at 1.0-1.2).  Urinalysis was normal. CT abdomen and pelvis without contrast showed mild  interval growth of right adrenal nodule to 4.9 from 1.9 cm on 08/01/21; concerning for primary neoplasm or metastasis. Calcium  gluconate was given to stabilize the heart due to hyperkalemia, Lokelma was given and IV hydration was provided.  TRH was asked to admit patient.  Review of Systems: Review of systems as noted in the HPI. All other systems reviewed and are negative.   Past Medical History:  Diagnosis Date   Acid reflux    Acute colitis 09/16/2019   AKI (acute kidney injury) 07/31/2012   Anemia    BENIGN PROSTATIC HYPERTROPHY, WITH OBSTRUCTION 12/18/2005   Qualifier: Diagnosis of  By: Olene Dodrill      Chest pain 07/31/2012   Colitis 09/16/2019   Colon cancer (HCC)    per patient, colon cancer more than 10 years ago s/p right hemicolectomy   Elevated bilirubin    Elevated LFTs    Ex-cigarette smoker    Gout    Hyperlipemia    Hypertension    Hyponatremia 08/01/2021   Intractable abdominal pain 02/03/2021   LATERAL MENISCUS TEAR, RIGHT 04/19/2006   Qualifier: Diagnosis of  By: Karren MD, Cornelius     Leukocytosis, unspecified 07/31/2012   Paroxysmal atrial fibrillation (HCC) 2014   Presence of permanent cardiac pacemaker    Prostate cancer (HCC) 08/08/2013   SEEDS 20 YEARS AGO   Prostate disease    RENAL CALCULUS 12/18/2005   Qualifier: Diagnosis of  By: Olene Dodrill     RUQ pain    UTI (urinary tract infection) 03/26/2021   Past Surgical History:  Procedure Laterality Date   BIOPSY  02/05/2021   Procedure: BIOPSY;  Surgeon: Eartha Angelia Sieving, MD;  Location: [facility];  Service: Gastroenterology;;   BIOPSY  08/02/2021   Procedure: BIOPSY;  Surgeon: Shaaron Lamar HERO, MD;  Location: [facility];  Service: Endoscopy;;   COLON SURGERY     Right hemicolectomy in the setting of colon cancer, per patient   CYSTOSCOPY WITH INSERTION OF UROLIFT N/A 05/19/2021   Procedure: CYSTOSCOPY WITH INSERTION OF UROLIFT;  Surgeon: Sherrilee Belvie LITTIE, MD;  Location: [facility];  Service: Urology;  Laterality: N/A;   ENTEROSCOPY N/A 11/21/2021   Procedure: PUSH ENTEROSCOPY;  Surgeon: Eartha Angelia Sieving, MD;  Location:  [facility];  Service: Gastroenterology;  Laterality: N/A;   ESOPHAGOGASTRODUODENOSCOPY (EGD) WITH PROPOFOL  N/A 02/05/2021   Procedure: ESOPHAGOGASTRODUODENOSCOPY (EGD) WITH PROPOFOL ;  Surgeon: Eartha Angelia Sieving, MD;  Location: [facility];  Service: Gastroenterology;  Laterality: N/A;   ESOPHAGOGASTRODUODENOSCOPY (EGD) WITH PROPOFOL  N/A 08/02/2021   Procedure: ESOPHAGOGASTRODUODENOSCOPY (EGD) WITH PROPOFOL ;  Surgeon: Shaaron Lamar HERO, MD;  Location: [facility];  Service: Endoscopy;  Laterality: N/A;   ESOPHAGOGASTRODUODENOSCOPY (EGD) WITH PROPOFOL  N/A 11/21/2021   Procedure: ESOPHAGOGASTRODUODENOSCOPY (EGD) WITH PROPOFOL ;  Surgeon: Eartha Angelia Sieving, MD;  Location: [facility];  Service: Gastroenterology;  Laterality: N/A;  1200  ASA 3   HOT HEMOSTASIS  08/02/2021   Procedure: HOT HEMOSTASIS (ARGON PLASMA COAGULATION/BICAP);  Surgeon: Shaaron Lamar HERO, MD;  Location: [facility];  Service: Endoscopy;;   HOT HEMOSTASIS  11/21/2021   Procedure: HOT HEMOSTASIS (ARGON PLASMA COAGULATION/BICAP);  Surgeon: Eartha Angelia, Sieving, MD;  Location: [facility];  Service: Gastroenterology;;   LEFT HEART CATHETERIZATION WITH CORONARY ANGIOGRAM N/A 07/21/2012   Procedure: LEFT HEART CATHETERIZATION WITH CORONARY ANGIOGRAM;  Surgeon: Debby DELENA Sor, MD;  Location: [facility] CATH LAB;  Service: Cardiovascular;  Laterality: N/A;   PACEMAKER INSERTION     PERMANENT PACEMAKER INSERTION N/A 07/22/2012   Procedure: PERMANENT PACEMAKER INSERTION;  Surgeon: Jerel Balding, MD;  Location: [facility] CATH LAB;  Service: Cardiovascular;  Laterality: N/A;   PROSTATE SURGERY      Social History:  reports that he quit smoking about 21 years ago. His smoking use included cigarettes. He has been exposed to tobacco smoke. He has quit using smokeless tobacco.  His smokeless tobacco use included chew. He reports that he does not drink alcohol and does not use drugs.   No Known Allergies  Family History  Problem Relation Age of Onset   Cancer Neg Hx      Prior to Admission medications   Medication Sig Start Date End Date Taking? Authorizing Provider  acetaminophen  (TYLENOL ) 650 MG CR tablet Take 1,300 mg by mouth every 8 (eight) hours as needed for pain.    [provider]  amLODipine  (NORVASC ) 10 MG tablet Take 1 tablet by mouth daily. 02/13/22   [provider]  carvedilol  (COREG ) 3.125 MG tablet Take 3.125 mg by mouth 2 (two) times daily with a  meal.    [provider]  diclofenac  sodium (VOLTAREN ) 1 % GEL Apply 2 g topically 4 (four) times daily. 06/19/17   Rancour, Garnette, MD  Ensure (ENSURE) Take 237 mLs by mouth daily as needed (nutrition). Vanilla or chocolate    [provider]  ferrous sulfate  324 MG TBEC Take 324 mg by mouth. Takes one tablet every other day    [provider]  furosemide  (LASIX ) 20 MG tablet Take 20 mg by mouth. One half tablet daily 12/04/21   [provider]  HYDROcodone -acetaminophen  (NORCO/VICODIN) 5-325 MG tablet Take 1 tablet by mouth every 4 (four) hours as needed. 09/04/23   Dean Clarity, MD  HYDROcodone -acetaminophen  (NORCO/VICODIN) 5-325 MG tablet Take 2 tablets by mouth every 6 (six) hours as needed for severe pain (pain score 7-10). 10/21/23   Garrick Lamar, MD  lisinopril (ZESTRIL) 10 MG tablet Take 1 tablet by mouth daily. 03/06/22   [provider]  ondansetron  (ZOFRAN -ODT) 4 MG disintegrating tablet Take 1 tablet (4 mg total) by mouth every 8 (eight) hours as needed for nausea or vomiting. 10/21/23   Garrick Lamar, MD  pantoprazole  (PROTONIX ) 40 MG tablet TAKE 1  TABLET BY MOUTH ONCE DAILY. 08/02/23   Carlan, Chelsea L, NP  sulfamethoxazole -trimethoprim  (BACTRIM  DS) 800-160 MG tablet Take 1 tablet by mouth every 12 (twelve) hours. 11/24/23   McKenzie, Belvie CROME, MD  tamsulosin  (FLOMAX ) 0.4 MG CAPS capsule Take 1 capsule (0.4 mg total) by mouth 2 (two) times daily. 09/15/23   McKenzie, Belvie CROME, MD  traMADol  (ULTRAM ) 50 MG tablet Take 1 tablet (50 mg total) by mouth at bedtime as needed. 03/25/22   Magnant, Carlin CROME, PA-C    Physical Exam: BP (!) 153/64   Pulse 60   Temp 98.1 F (36.7 C) (Oral)   Resp 16   Wt 79.4 kg   SpO2 99%   BMI 23.73 kg/m   General: 89 y.o. year-old male well developed well nourished in no acute distress.  Alert and oriented x3. HEENT: NCAT, EOMI, dry mucous membranes Neck: Supple, trachea medial Cardiovascular: Regular  rate and rhythm with no rubs or gallops.  No thyromegaly or JVD noted.  No lower extremity edema. 2/4 pulses in all 4 extremities. Respiratory: Clear to auscultation with no wheezes or rales. Good inspiratory effort. Abdomen: Soft, nontender nondistended with normal bowel sounds x4 quadrants. Muskuloskeletal: No cyanosis, clubbing or edema noted bilaterally Neuro: CN II-XII intact, strength 5/5 x 4, sensation, reflexes intact Skin: No ulcerative lesions noted or rashes Psychiatry: Judgement and insight appear normal. Mood is appropriate for condition and setting          Labs on Admission:  Basic Metabolic Panel: Recent Labs  Lab 12/05/23 1755  NA 130*  K 6.2*  CL 100  CO2 20*  GLUCOSE 98  BUN 36*  CREATININE 2.71*  CALCIUM  9.5  MG 2.1   Liver Function Tests: Recent Labs  Lab 12/05/23 1755  AST 19  ALT 7  ALKPHOS 62  BILITOT 0.5  PROT 7.5  ALBUMIN 4.0   No results for input(s): LIPASE, AMYLASE in the last 168 hours. No results for input(s): AMMONIA in the last 168 hours. CBC: Recent Labs  Lab 12/05/23 1751  WBC 3.0*  NEUTROABS 0.6*  HGB 10.2*  HCT 31.1*  MCV 103.3*  PLT 282   Cardiac Enzymes: No results for input(s): CKTOTAL, CKMB, CKMBINDEX, TROPONINI in the last 168 hours.  BNP (last 3 results) No results for input(s): BNP in the last 8760 hours.  ProBNP (last 3 results) No results for input(s): PROBNP in the last 8760 hours.  CBG: No results for input(s): GLUCAP in the last 168 hours.  Radiological Exams on Admission: CT ABDOMEN PELVIS WO CONTRAST Result Date: 12/05/2023 EXAM: CT ABDOMEN AND PELVIS WITHOUT CONTRAST 12/05/2023 08:13:00 PM TECHNIQUE: CT of the abdomen and pelvis was performed without the administration of intravenous contrast. Multiplanar reformatted images are provided for review. Automated exposure control, iterative reconstruction, and/or weight-based adjustment of the mA/kV was utilized to reduce the radiation  dose to as low as reasonably achievable. COMPARISON: CT 08/01/2021. CLINICAL HISTORY: Abdominal pain, acute, nonlocalized; recent abd pain admission, ongoing pain. BIB son from home for abd cramping and diarrhea. Describes as continued, recurrent. Seen here for the same previously, and admitted in the [facility] in [place] last week for the same. Returns for continued sx. Stool watery and black. States, But, they did not find any blood. Was constipated at the [facility]. Takes iron. FINDINGS: LOWER CHEST: No acute abnormality. LIVER: The liver is unremarkable. GALLBLADDER AND BILE DUCTS: Cholecystectomy. No biliary ductal dilatation. SPLEEN: No acute abnormality. PANCREAS: No acute abnormality. ADRENAL GLANDS: Increased size of  the right adrenal nodule now measuring 4.9 x 2.8 cm (series 2, image 20), previously 1.1 x 1.9 cm on CT 08/01/2021. KIDNEYS, URETERS AND BLADDER: Heterogeneous exophytic mass extending from the lower pole of the right kidney measuring 2.3 cm. This is incompletely evaluated without IV contrast but is not substantially changed from 08/01/2021. 2.3 cm low-density cystic lesion in the mid pole of the right kidney. This previously demonstrated partial enhancement on 08/01/2021. Within the anterior left kidney there is a 2.2 cm lesion which previously demonstrated some enhancement. This has not changed in size. These lesions remain indeterminate. No stones in the kidneys or ureters. No hydronephrosis. No perinephric or periureteral stranding. Urinary bladder is unremarkable. GI AND BOWEL: Stomach demonstrates no acute abnormality. Postoperative change about the right colon. There is no bowel obstruction. PERITONEUM AND RETROPERITONEUM: No ascites. No free air. VASCULATURE: Aorta is normal in caliber. Aortic atherosclerotic calcification. LYMPH NODES: No lymphadenopathy. REPRODUCTIVE ORGANS: Brachytherapy seeds in the prostate. BONES AND SOFT TISSUES: Advanced arthritis right hip. Fluid containing right inguinal  hernia. No acute osseous abnormality. No focal soft tissue abnormality. IMPRESSION: 1. Marked interval growth of right adrenal nodule to 4.9 from 1.9 cm on 08/01/21; concerning for primary neoplasm or metastasis. Recommend clinical correlation and dedicated adrenal protocol CT or MRI. 2. Indeterminate renal lesions as described. These are incompletely evaluated without IV contrast but are not substantially changed in size from 6 / 16 / 23 Electronically signed by: Norman Gatlin MD 12/05/2023 08:27 PM EDT RP Workstation: [id number]    EKG: I independently viewed the EKG done and my findings are as followed: Atrial fibrillation with rate controlled, nonspecific IVCD with LAD, QTc 552 ms  Assessment/Plan Present on Admission:  Hyperkalemia  Hyponatremia  Acute kidney injury superimposed on stage 4 chronic kidney disease (HCC)  Abdominal pain  Macrocytic anemia  Essential hypertension  GERD (gastroesophageal reflux disease)  Principal Problem:   Hyperkalemia Active Problems:   Essential hypertension   GERD (gastroesophageal reflux disease)   Abdominal pain   Acute kidney injury superimposed on stage 4 chronic kidney disease (HCC)   Hyponatremia   Macrocytic anemia   Leukopenia   Generalized weakness   Prolonged QT interval   Anemia of chronic disease   Atrial fibrillation, chronic (HCC)   Urinary retention  Hyperkalemia K+ 6.2 Calcium  gluconate was given IV hydration was provided Lokelma was given Continue telemetry Continue to monitor potassium levels with morning labs  Hyponatremia This may be due to dehydration Continue gentle hydration Continue to monitor sodium with serial BMPs Urine osmolality, serum osmolality and urine sodium will be checked  Leukopenia possibly reactive WBC 3.0, no obvious sign of any acute infectious process and patient was afebrile Continue to monitor WBC with morning labs  Acute kidney injury superimposed on CKD 4 Dehydration Creatinine 2.71  (baseline creatinine at 1.0-1.2) Continue gentle hydration Renally adjust medications, avoid nephrotoxic agents/dehydration/hypotension  Generalized weakness Fall precaution Continue PT/OT eval and treat  Abdominal pain This is possibly related to the patient's diarrhea He has not had any bowel movement since arrival to the ED Continue oxycodone  every 4 hours as needed for moderate/severe pain  Prolonged QT interval QTc 552 ms Avoid QT prolonging drugs Magnesium  2.1 Repeat EKG in the morning  Macrocytic anemia MCV 103.3 Vitamin B12 and folate levels will be checked  Anemia of Chronic disease Hemoglobin low at 10.2, stable Continue ferrous sulfate   Essential hypertension Continue amlodipine , Coreg   GERD Continue Protonix   Chronic atrial fibrillation Continue Coreg  Patient  was not on any anticoagulant possibly due to history of GI bleed CT abdomen and pelvis showed  interval growth of right adrenal nodule to 4.9 from 1.9 cm on 08/01/21 Metastatic prostate cancer to the right adrenal gland Patient follows with Dr. Rogers  Urinary retention Continue Flomax  twice daily bowel regimen  DVT prophylaxis: Heparin  subcu  Code Status: DNR  Family Communication: Son at bedside (all questions answered to satisfaction)  Consults: None  Severity of Illness: The appropriate patient status for this patient is INPATIENT. Inpatient status is judged to be reasonable and necessary in order to provide the required intensity of service to ensure the patient's safety. The patient's presenting symptoms, physical exam findings, and initial radiographic and laboratory data in the context of their chronic comorbidities is felt to place them at high risk for further clinical deterioration. Furthermore, it is not anticipated that the patient will be medically stable for discharge from the hospital within 2 midnights of admission.   * I certify that at the point of admission it is my  clinical judgment that the patient will require inpatient hospital care spanning beyond 2 midnights from the point of admission due to high intensity of service, high risk for further deterioration and high frequency of surveillance required.*  Author: Fariha Goto, DO 12/06/2023 3:01 AM  For on call review [email or web address].

## 2023-12-06 DIAGNOSIS — R4589 Other symptoms and signs involving emotional state: Secondary | ICD-10-CM

## 2023-12-06 DIAGNOSIS — Z789 Other specified health status: Secondary | ICD-10-CM

## 2023-12-06 DIAGNOSIS — D638 Anemia in other chronic diseases classified elsewhere: Secondary | ICD-10-CM | POA: Insufficient documentation

## 2023-12-06 DIAGNOSIS — Z7189 Other specified counseling: Secondary | ICD-10-CM | POA: Diagnosis not present

## 2023-12-06 DIAGNOSIS — Z66 Do not resuscitate: Secondary | ICD-10-CM | POA: Diagnosis not present

## 2023-12-06 DIAGNOSIS — R531 Weakness: Secondary | ICD-10-CM

## 2023-12-06 DIAGNOSIS — R109 Unspecified abdominal pain: Secondary | ICD-10-CM

## 2023-12-06 DIAGNOSIS — Z515 Encounter for palliative care: Secondary | ICD-10-CM

## 2023-12-06 DIAGNOSIS — E875 Hyperkalemia: Secondary | ICD-10-CM | POA: Diagnosis not present

## 2023-12-06 DIAGNOSIS — D72819 Decreased white blood cell count, unspecified: Secondary | ICD-10-CM | POA: Insufficient documentation

## 2023-12-06 DIAGNOSIS — I482 Chronic atrial fibrillation, unspecified: Secondary | ICD-10-CM | POA: Insufficient documentation

## 2023-12-06 DIAGNOSIS — C7971 Secondary malignant neoplasm of right adrenal gland: Secondary | ICD-10-CM

## 2023-12-06 DIAGNOSIS — R9431 Abnormal electrocardiogram [ECG] [EKG]: Secondary | ICD-10-CM | POA: Insufficient documentation

## 2023-12-06 DIAGNOSIS — C61 Malignant neoplasm of prostate: Secondary | ICD-10-CM

## 2023-12-06 DIAGNOSIS — R339 Retention of urine, unspecified: Secondary | ICD-10-CM | POA: Insufficient documentation

## 2023-12-06 LAB — CBC
HCT: 27.6 % — ABNORMAL LOW (ref 39.0–52.0)
Hemoglobin: 9.1 g/dL — ABNORMAL LOW (ref 13.0–17.0)
MCH: 34 pg (ref 26.0–34.0)
MCHC: 33 g/dL (ref 30.0–36.0)
MCV: 103 fL — ABNORMAL HIGH (ref 80.0–100.0)
Platelets: 241 K/uL (ref 150–400)
RBC: 2.68 MIL/uL — ABNORMAL LOW (ref 4.22–5.81)
RDW: 12.9 % (ref 11.5–15.5)
WBC: 3.3 K/uL — ABNORMAL LOW (ref 4.0–10.5)
nRBC: 0 % (ref 0.0–0.2)

## 2023-12-06 LAB — PHOSPHORUS: Phosphorus: 3.9 mg/dL (ref 2.5–4.6)

## 2023-12-06 LAB — COMPREHENSIVE METABOLIC PANEL WITH GFR
ALT: 7 U/L (ref 0–44)
AST: 18 U/L (ref 15–41)
Albumin: 3.6 g/dL (ref 3.5–5.0)
Alkaline Phosphatase: 56 U/L (ref 38–126)
Anion gap: 9 (ref 5–15)
BUN: 32 mg/dL — ABNORMAL HIGH (ref 8–23)
CO2: 21 mmol/L — ABNORMAL LOW (ref 22–32)
Calcium: 9.2 mg/dL (ref 8.9–10.3)
Chloride: 104 mmol/L (ref 98–111)
Creatinine, Ser: 2.09 mg/dL — ABNORMAL HIGH (ref 0.61–1.24)
GFR, Estimated: 29 mL/min — ABNORMAL LOW (ref >60)
Glucose, Bld: 85 mg/dL (ref 70–99)
Potassium: 5.6 mmol/L — ABNORMAL HIGH (ref 3.5–5.1)
Sodium: 133 mmol/L — ABNORMAL LOW (ref 135–145)
Total Bilirubin: 0.4 mg/dL (ref 0.0–1.2)
Total Protein: 6.7 g/dL (ref 6.5–8.1)

## 2023-12-06 LAB — SODIUM, URINE, RANDOM: Sodium, Ur: 82 mmol/L

## 2023-12-06 LAB — MAGNESIUM: Magnesium: 1.9 mg/dL (ref 1.7–2.4)

## 2023-12-06 LAB — FOLATE: Folate: 6.4 ng/mL (ref >5.9)

## 2023-12-06 LAB — VITAMIN B12: Vitamin B-12: 383 pg/mL (ref 180–914)

## 2023-12-06 LAB — OSMOLALITY: Osmolality: 285 mosm/kg (ref 275–295)

## 2023-12-06 LAB — OSMOLALITY, URINE: Osmolality, Ur: 326 mosm/kg (ref 300–900)

## 2023-12-06 MED ORDER — HEPARIN SODIUM (PORCINE) 5000 UNIT/ML IJ SOLN
5000.0000 [IU] | Freq: Three times a day (TID) | INTRAMUSCULAR | Status: DC
  Administered 2023-12-06 – 2023-12-10 (×13): 5000 [IU] via SUBCUTANEOUS
  Filled 2023-12-06 (×13): qty 1

## 2023-12-06 MED ORDER — ACETAMINOPHEN 650 MG RE SUPP: 650.0000 mg | Freq: Four times a day (QID) | RECTAL | Status: DC | PRN

## 2023-12-06 MED ORDER — MAGNESIUM CITRATE PO SOLN: 1.0000 | Freq: Once | ORAL | Status: DC

## 2023-12-06 MED ORDER — SODIUM ZIRCONIUM CYCLOSILICATE 5 G PO PACK
5.0000 g | PACK | ORAL | Status: AC
  Administered 2023-12-06: 5 g via ORAL
  Filled 2023-12-06: qty 1

## 2023-12-06 MED ORDER — MELATONIN 3 MG PO TABS
6.0000 mg | ORAL_TABLET | Freq: Once | ORAL | Status: AC
  Administered 2023-12-06: 6 mg via ORAL
  Filled 2023-12-06: qty 2

## 2023-12-06 MED ORDER — TAMSULOSIN HCL 0.4 MG PO CAPS
0.4000 mg | ORAL_CAPSULE | Freq: Two times a day (BID) | ORAL | Status: DC
  Administered 2023-12-06 – 2023-12-10 (×10): 0.4 mg via ORAL
  Filled 2023-12-06 (×10): qty 1

## 2023-12-06 MED ORDER — SENNA 8.6 MG PO TABS
1.0000 | ORAL_TABLET | Freq: Once | ORAL | Status: AC
Start: 2023-12-06 — End: 2023-12-06
  Administered 2023-12-06: 8.6 mg via ORAL
  Filled 2023-12-06: qty 1

## 2023-12-06 MED ORDER — PANTOPRAZOLE SODIUM 40 MG PO TBEC
40.0000 mg | DELAYED_RELEASE_TABLET | Freq: Every day | ORAL | Status: DC
  Administered 2023-12-06 – 2023-12-10 (×5): 40 mg via ORAL
  Filled 2023-12-06 (×5): qty 1

## 2023-12-06 MED ORDER — CARVEDILOL 3.125 MG PO TABS
3.1250 mg | ORAL_TABLET | Freq: Two times a day (BID) | ORAL | Status: DC
  Administered 2023-12-06 – 2023-12-10 (×7): 3.125 mg via ORAL
  Filled 2023-12-06 (×8): qty 1

## 2023-12-06 MED ORDER — PROCHLORPERAZINE EDISYLATE 10 MG/2ML IJ SOLN
10.0000 mg | Freq: Four times a day (QID) | INTRAMUSCULAR | Status: DC | PRN
  Administered 2023-12-06 – 2023-12-08 (×2): 10 mg via INTRAVENOUS
  Filled 2023-12-06 (×2): qty 2

## 2023-12-06 MED ORDER — AMLODIPINE BESYLATE 5 MG PO TABS
10.0000 mg | ORAL_TABLET | Freq: Every day | ORAL | Status: DC
  Administered 2023-12-06 – 2023-12-07 (×2): 10 mg via ORAL
  Filled 2023-12-06 (×3): qty 2

## 2023-12-06 MED ORDER — MAGNESIUM CITRATE PO SOLN
1.0000 | Freq: Once | ORAL | Status: AC
Start: 2023-12-06 — End: 2023-12-06
  Administered 2023-12-06: 1 via ORAL
  Filled 2023-12-06: qty 296

## 2023-12-06 MED ORDER — OXYCODONE HCL 5 MG PO TABS
5.0000 mg | ORAL_TABLET | ORAL | Status: DC | PRN
  Administered 2023-12-06 – 2023-12-07 (×2): 5 mg via ORAL
  Filled 2023-12-06 (×2): qty 1

## 2023-12-06 MED ORDER — FERROUS SULFATE 325 (65 FE) MG PO TABS
324.0000 mg | ORAL_TABLET | ORAL | Status: DC
  Administered 2023-12-07 – 2023-12-09 (×2): 324 mg via ORAL
  Filled 2023-12-06 (×3): qty 1

## 2023-12-06 MED ORDER — ACETAMINOPHEN 325 MG PO TABS
650.0000 mg | ORAL_TABLET | Freq: Four times a day (QID) | ORAL | Status: DC | PRN
  Administered 2023-12-08 – 2023-12-10 (×5): 650 mg via ORAL
  Filled 2023-12-06 (×5): qty 2

## 2023-12-06 NOTE — Plan of Care (Signed)
   Problem: Education: Goal: Knowledge of General Education information will improve Description: Including pain rating scale, medication(s)/side effects and non-pharmacologic comfort measures Outcome: Progressing   Problem: Clinical Measurements: Goal: Ability to maintain clinical measurements within normal limits will improve Outcome: Progressing Goal: Diagnostic test results will improve Outcome: Progressing

## 2023-12-06 NOTE — Consult Note (Addendum)
 Palliative Care Consult Note                                  Date: 12/06/2023   Patient Name: Derrick Long  DOB: February 16, 1927  MRN: [id number]  Age / Sex: 89 y.o., male  PCP: Estefana Doffing, MD Referring Physician: Willette Adriana LABOR, MD  Reason for Consultation: Establishing goals of care  Past Medical History:  Diagnosis Date   Acid reflux    Acute colitis 09/16/2019   AKI (acute kidney injury) 07/31/2012   Anemia    BENIGN PROSTATIC HYPERTROPHY, WITH OBSTRUCTION 12/18/2005   Qualifier: Diagnosis of  By: Olene Dodrill     Chest pain 07/31/2012   Colitis 09/16/2019   Colon cancer (HCC)    per patient, colon cancer more than 10 years ago s/p right hemicolectomy   Elevated bilirubin    Elevated LFTs    Ex-cigarette smoker    Gout    Hyperlipemia    Hypertension    Hyponatremia 08/01/2021   Intractable abdominal pain 02/03/2021   LATERAL MENISCUS TEAR, RIGHT 04/19/2006   Qualifier: Diagnosis of  By: Karren MD, Cornelius     Leukocytosis, unspecified 07/31/2012   Paroxysmal atrial fibrillation (HCC) 2014   Presence of permanent cardiac pacemaker    Prostate cancer (HCC) 08/08/2013   SEEDS 20 YEARS AGO   Prostate disease    RENAL CALCULUS 12/18/2005   Qualifier: Diagnosis of  By: Olene Dodrill     RUQ pain    UTI (urinary tract infection) 03/26/2021    Subjective:   This NP Camellia Kays reviewed medical records, received report from team, assessed the patient and then meet at the patient's bedside to discuss diagnosis, prognosis, GOC, EOL wishes disposition and options.  Before meeting with the patient/family, I spent time reviewing the chart notes including ED provider notes, admission H&P, ED nursing notes, family medicine note from today, TOC note from today, PT notes from today. I also reviewed vital signs, nursing flowsheets, medication administrations record, labs, and imaging. Labs reviewed include CMP which  shows improvement in potassium to 5.6 from 6.2 yesterday in the setting of significant hyperkalemia on admission.  Creatinine shows slight improvement from 2.71 yesterday to 2.09 today in the setting of AKI on CKD 4 with a baseline creatinine of 1.0-1.2.  Also reviewed imaging including CT of the abdomen and pelvis without contrast completed yesterday which shows significant increase in the size of the right adrenal nodule now measuring 4.9 x 2.8 cm, previously 1.1 x 1.9 cm on 08/01/2021.  This represents almost doubling in size of adrenal metastasis from advanced prostate cancer.  Previously reviewed outpatient oncology note with Dr. Rogers completed in November 2024.  At that time indicated they were sending off specialized testing for mutations to identify any possible role of immunotherapy.  In that visit Dr. MARLA expressed concern that if no actionable mutation then unlikely to tolerate chemotherapy and would recommend palliative care and hospice care.  However, the patient has not seen oncology since then (nearly a year).  I met with the patient at bedside, his son Derrick Long was also present.  For a brief time he also called his daughter Darin whom the patient lives with to get more information on the last oncology visit..   We meet to discuss diagnosis prognosis, GOC, EOL wishes, disposition and options. Concept of Palliative Care was introduced as specialized medical care for people  and their families living with serious illness.  If focuses on providing relief from the symptoms and stress of a serious illness.  The goal is to improve quality of life for both the patient and the family. Values and goals of care important to patient and family were attempted to be elicited.  Created space and opportunity for patient  and family to explore thoughts and feelings regarding current medical situation   Natural trajectory and current clinical status were discussed. Questions and concerns addressed. Patient   encouraged to call with questions or concerns.    Patient/Family Understanding of Illness: Patient understands he is in the hospital for his bowel/stomach, has been having some of these problems for a while now.  Initially it started with loose stools that became constipation.  He feels better today overall from his abdominal perspective.  We spent a lot of time talking about his chronic illness, and his cancer.  Review of the records shows that last in system cancer visit was in November 2024.  After speaking with the patient's daughter, family confirms that he has not seen oncology since then because Dr. Rogers left the system and moved to another city.  Son states that [facility].  We talked about updated imaging today which shows an increase size and his adrenal metastasis from his advanced prostate cancer.  Life Review: The patient has a son and 3 daughters.  He currently lives with his daughter Alfonso.  He has a good appetite, enjoys watching baseball and his favorite team is the [employer].  He enjoys hunting and fishing as well.  He is religious and practices in the past disposition.  I offered spiritual care consult for chaplain support while admitted and he excepted.  Patient Values: Family, faith  Baseline Status: Lives with daughter, requires some assistance with ADLs.  Today's Discussion: In addition to discussions described above we had extensive discussion on various topics. We spent a lot of time talking about his chronic illness, and his cancer.  Review of the records shows that last in system cancer visit was in November 2024.  After speaking with the patient's daughter, family confirms that he has not seen oncology since then because Dr. Rogers left the system and moved to another city.  Son states that he needs [facility].  We talked about updated imaging today which shows an  increase size and his adrenal metastasis from his advanced prostate cancer.  The patient states he would be open to cancer treatment, if offered.  His son seems a little hesitant understanding how hard chemotherapy would be.  I shared my opinion that, I am very concerned that there may not be any treatment options left based on his last note and the new increase in size of his adrenal metastasis.  The patient and son agree that this may be the case.  We talked about options moving forward if there are no further treatment options for his cancer.  This includes discussion about comfort care and hospice care. I described hospice as a service for patients who have a life expectancy of 6 months or less. The goal of hospice is the preservation of dignity and quality at the end phases of life. Under hospice care, the focus changes from curative to symptom relief. I explained the three setting where hospice services can be provided including the home, at a living facility (such as  LTC SNF, Assisted Living, etc), and a hospice facility. I explained that acceptance to hospice in any specific location is the final decision of the hospice medical director and bed availability, if applicable. They verbalized understanding.  The patient states that he is at peace with this.  His son is understandably emotional.  At end of our visit confirmed DNR-limited, treat the treatable to try to get better and out of the hospital, it is urgent to follow-up with oncology to understand options moving forward, may need to have conversation about comfort care and hospice care if no further treatment options exist.  I should have her come back to check on the patient tomorrow, follow-up with family as well.  Goals: DNR-limited, continue to treat the treatable, time for outcomes, oncology follow-up to discern any available treatment options for cancer, open to comfort/hospice conversation if no options remaining.  Review of Systems   Respiratory:  Negative for chest tightness and shortness of breath.   Cardiovascular:  Negative for chest pain.  Gastrointestinal:  Positive for constipation. Negative for abdominal pain (Improved from baseline at admission), nausea and vomiting.       No bowel movement yet, has drank half a bottle of magnesium  citrate    Objective:   Primary Diagnoses: Present on Admission:  Hyperkalemia  Hyponatremia  Acute kidney injury superimposed on stage 4 chronic kidney disease (HCC)  Abdominal pain  Macrocytic anemia  Essential hypertension  GERD (gastroesophageal reflux disease)   Vital Signs:  BP (!) 111/52 (BP Location: Right Arm)   Pulse (!) 59   Temp 97.7 F (36.5 C) (Oral)   Resp 18   Ht 6' 1 (1.854 m)   Wt 79.2 kg   SpO2 96%   BMI 23.04 kg/m   Physical Exam Vitals and nursing note reviewed.  Constitutional:      General: He is not in acute distress.    Appearance: He is normal weight. He is ill-appearing. He is not toxic-appearing.  HENT:     Head: Normocephalic and atraumatic.  Cardiovascular:     Rate and Rhythm: Normal rate.  Pulmonary:     Effort: Pulmonary effort is normal. No respiratory distress.     Breath sounds: No wheezing or rhonchi.  Abdominal:     General: Abdomen is flat. Bowel sounds are normal. There is no distension.     Palpations: Abdomen is soft.     Tenderness: There is no abdominal tenderness.  Skin:    General: Skin is warm and dry.  Neurological:     General: No focal deficit present.     Mental Status: He is alert and oriented to person, place, and time.  Psychiatric:        Mood and Affect: Mood normal.        Behavior: Behavior normal.     Palliative Assessment/Data: 70%   Advanced Care Planning:   Existing Vynca/ACP Documentation: None  Primary Decision Maker: PATIENT  Pertinent diagnosis: Hyperkalemia, abdominal pain, constipation, metastatic prostate cancer, AKI on CKD  The patient and/or family consented to a  voluntary Advance Care Planning Conversation in person. Individuals present for the conversation: The patient; his son Derrick Long Derrick Long; Camellia Kays, NP; [place],   Summary of the conversation: We discussed the details of the clinical picture, growth of his cancer, no oncology visit in the last year, last oncology notes indicating likely short to no options, possible need for comfort care/hospice care pending further oncology input.  We discussed options moving forward  from full and aggressive care through comfort care and multiple points in between.  Outcome of the conversations and/or documents completed: Confirmed DNR/DNI (DNR-limited), continue to treat the treatable to recover from acute illness, seek further oncology input for options related to cancer treatment, discussed comfort care/hospice care if no options remaining.  I spent 20 minutes providing separately identifiable ACP services with the patient and/or surrogate decision maker in a voluntary, in-person conversation discussing the patient's wishes and goals as detailed in the above note.  Assessment & Plan:   HPI/Patient Profile: 89 y.o. 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 Rogers) who presents emergency department due to abdominal pain. He was admitted on 12/05/2023 with hyperkalemia, hyponatremia, leukopenia, AKI on CKD, generalized weakness, abdominal pain, constipation, pelvic mass/metastatic prostate cancer, and others.   Palliative medicine was consulted for GOC conversations.  SUMMARY OF RECOMMENDATIONS   DNR-Limited Continue to treat the treatable for acute situation Work to reestablish with oncology to discuss options Ongoing goals of care discussion, including possible discussion on hospice/comfort, pending recommendations from oncology Palliative medicine  will continue to follow  Symptom Management:  Per primary team Palliative medicine is available to assist as needed  Code Status: DNR - Limited (DNR/DNI)  Prognosis:  Unable to determine  Discharge Planning:  To Be Determined   Discussed with: Patient, patient's family, medical team, nursing team    Thank you for allowing us  to participate in the care of RONN SMOLINSKY PMT will continue to support holistically.  Billing based on MDM: Moderate  Detailed review of medical records (labs, imaging, vital signs), medically appropriate exam, discussed with treatment team, counseling and education to patient, family, & staff, documenting clinical information, medication management, coordination of care  Signed by: Camellia Kays, NP Palliative Medicine Team  Team Phone # [phone] (Nights/Weekends)  12/06/2023, 3:44 PM

## 2023-12-06 NOTE — ED Notes (Signed)
 Pt resting at this time.

## 2023-12-06 NOTE — Evaluation (Signed)
 Occupational Therapy Evaluation Patient Details Name: Derrick Long MRN: [id number] DOB: [birth year] Today's Date: 12/06/2023   History of Present Illness   Derrick Long is a 89 y.o. 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  Patient was recently admitted to the [facility] in [place] due to abdominal pain and constipation treated with laxatives after which patient had a bowel movement and was discharged home.  However he has been having loose stools and some abdominal discomfort.  He denies fever, shortness of breath, vomiting. (per DO)     Clinical Impressions Pt agreeable to OT and PT co-evaluation. Pt reports living with his daughter. Pt uses RW or quad cane for ambulation at baseline. Assist for IADL's at baseline. Pt demonstrates very limited shoulder A/ROM due to baseline issues. Pt was able to manage socks while seated without physical assist. Min A to CGA needed for transfers with RW. Pt left in the chair with call bell within reach. Pt will benefit from continued OT in the hospital to increase strength, balance, and endurance for safe ADL's.         If plan is discharge home, recommend the following:   A little help with walking and/or transfers;A little help with bathing/dressing/bathroom;Assistance with cooking/housework;Assist for transportation;Help with stairs or ramp for entrance     Functional Status Assessment   Patient has had a recent decline in their functional status and demonstrates the ability to make significant improvements in function in a reasonable and predictable amount of time.     Equipment Recommendations   None recommended by OT             Precautions/Restrictions   Precautions Precautions:  Fall Recall of Precautions/Restrictions: Intact Restrictions Weight Bearing Restrictions Per Provider Order: No     Mobility Bed Mobility Overal bed mobility: Modified Independent             General bed mobility comments: No physical asssit; mild labored movement.    Transfers Overall transfer level: Needs assistance Equipment used: Rolling walker (2 wheels) Transfers: Sit to/from Stand, Bed to chair/wheelchair/BSC Sit to Stand: Contact guard assist, Min assist     Step pivot transfers: Contact guard assist     General transfer comment: EOB to chair with RW      Balance Overall balance assessment: Needs assistance Sitting-balance support: No upper extremity supported, Feet supported Sitting balance-Leahy Scale: Good Sitting balance - Comments: seated at EOB   Standing balance support: Reliant on assistive device for balance, During functional activity, Bilateral upper extremity supported Standing balance-Leahy Scale: Fair Standing balance comment: using RW                           ADL either performed or assessed with clinical judgement   ADL Overall ADL's : Needs assistance/impaired     Grooming: Set up;Minimal assistance;Sitting   Upper Body Bathing: Minimal assistance;Sitting;Set up   Lower Body Bathing: Modified independent;Sitting/lateral leans;Set up   Upper Body Dressing : Set up;Minimal assistance;Sitting   Lower Body Dressing: Modified independent;Set up;Sitting/lateral leans Lower Body Dressing Details (indicate cue type and reason): Able to doff and don socks seated in the chair without physical assist. Toilet Transfer: Contact guard assist;Minimal assistance;Stand-pivot;Ambulation Toilet Transfer Details (indicate cue type and reason): EOB to chair with  RW and ambulation with the RW. Toileting- Clothing Manipulation and Hygiene: Set up;Modified independent;Sitting/lateral lean       Functional mobility during ADLs: Contact guard  assist;Rolling walker (2 wheels)       Vision Baseline Vision/History: 1 Wears glasses (hx of cataracts removal) Ability to See in Adequate Light: 1 Impaired Patient Visual Report: No change from baseline Vision Assessment?: No apparent visual deficits     Perception Perception: Not tested       Praxis Praxis: Not tested       Pertinent Vitals/Pain Pain Assessment Pain Assessment: No/denies pain     Extremity/Trunk Assessment Upper Extremity Assessment Upper Extremity Assessment:  (2+/5 shoulder flexion bilaterally; limited to ~50% P/ROM as well; pt reports history of arthritis and shoulder injueries. Pt gets shots in his shoulders. 4/5 grip bilaterally. Generally weak other than shoulder deficits.)   Lower Extremity Assessment Lower Extremity Assessment: Defer to PT evaluation   Cervical / Trunk Assessment Cervical / Trunk Assessment: Kyphotic   Communication Communication Communication: No apparent difficulties   Cognition Arousal: Alert Behavior During Therapy: WFL for tasks assessed/performed Cognition: No apparent impairments             OT - Cognition Comments: Some extended time to recall home equipment. Followed commands well.                 Following commands: Intact       Cueing  General Comments   Cueing Techniques: Verbal cues;Tactile cues                 Home Living Family/patient expects to be discharged to:: Private residence Living Arrangements: Children Available Help at Discharge: Available 24 hours/day Type of Home: Mobile home Home Access: Stairs to enter [employer] of Steps: 3 Entrance Stairs-Rails: Right;Left Home Layout: One level     Bathroom Shower/Tub: Chief Strategy Officer: Handicapped height Bathroom Accessibility: Yes   Home Equipment: Rolling Walker (2 wheels);BSC/3in1;Grab bars - tub/shower;Grab bars - toilet;Cane - single point;Shower seat - built in   Additional Comments: Pt  reports living with his daughter.      Prior Functioning/Environment Prior Level of Function : Needs assist;History of Falls (last six months) (2 falls in 6 months)       Physical Assist : ADLs (physical)   ADLs (physical): IADLs Mobility Comments: Community ambulator with RW and quad cane. ADLs Comments: Independent ADL's; assisted for IADL's.    OT Problem List: Decreased strength;Decreased range of motion;Decreased activity tolerance;Impaired balance (sitting and/or standing)   OT Treatment/Interventions: Self-care/ADL training;Therapeutic exercise;Therapeutic activities;Patient/family education;Balance training;DME and/or AE instruction      OT Goals(Current goals can be found in the care plan section)   Acute Rehab OT Goals Patient Stated Goal: return home OT Goal Formulation: With patient Time For Goal Achievement: 12/20/23 Potential to Achieve Goals: Good   OT Frequency:  Min 2X/week    Co-evaluation PT/OT/SLP Co-Evaluation/Treatment: Yes Reason for Co-Treatment: To address functional/ADL transfers PT goals addressed during session: Mobility/safety with mobility OT goals addressed during session: ADL's and self-care;Strengthening/ROM                       End of Session Equipment Utilized During Treatment: Rolling walker (2 wheels)  Activity Tolerance: Patient tolerated treatment well Patient left: in chair;with call bell/phone within reach  OT Visit Diagnosis: Unsteadiness on feet (R26.81);Other abnormalities of gait and mobility (R26.89);Muscle weakness (generalized) (M62.81);History of falling (Z91.81)  Time: 8882-8865 OT Time Calculation (min): 17 min Charges:  OT General Charges $OT Visit: 1 Visit OT Evaluation $OT Eval Low Complexity: 1 Low  Yeiren Whitecotton OT, MOT   Jayson Person 12/06/2023, 3:57 PM

## 2023-12-06 NOTE — Plan of Care (Signed)
  Problem: Acute Rehab OT Goals (only OT should resolve) Goal: Pt. Will Perform Grooming Flowsheets (Taken 12/06/2023 1600) Pt Will Perform Grooming:  with modified independence  standing Goal: Pt. Will Perform Upper Body Dressing Flowsheets (Taken 12/06/2023 1600) Pt Will Perform Upper Body Dressing: with modified independence Goal: Pt. Will Perform Lower Body Dressing Flowsheets (Taken 12/06/2023 1600) Pt Will Perform Lower Body Dressing: with modified independence Goal: Pt. Will Transfer To Toilet Flowsheets (Taken 12/06/2023 1600) Pt Will Transfer to Toilet:  with modified independence  ambulating Goal: Pt. Will Perform Toileting-Clothing Manipulation Flowsheets (Taken 12/06/2023 1600) Pt Will Perform Toileting - Clothing Manipulation and hygiene: with modified independence Goal: Pt/Caregiver Will Perform Home Exercise Program Flowsheets (Taken 12/06/2023 1600) Pt/caregiver will Perform Home Exercise Program:  Increased strength  Increased ROM  Both right and left upper extremity  With minimal assist  Kinlee Garrison OT, MOT

## 2023-12-06 NOTE — Evaluation (Signed)
 Physical Therapy Evaluation Patient Details Name: Derrick Long MRN: [id number] DOB: [birth year] Today's Date: 12/06/2023  History of Present Illness  Derrick Long is a 89 y.o. 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 Rogers) who presents emergency department due to abdominal pain.  Patient was recently admitted to the [facility] hospital in [place] due to abdominal pain and constipation treated with laxatives after which patient had a bowel movement and was discharged home.  However he has been having loose stools and some abdominal discomfort.  He denies fever, shortness of breath, vomiting.   Clinical Impression  Pt. Presented with generalized weakness in the LE and UE, during bed mobility pt. Had supervision to EOB, and had contact guarding and min A for transfer from bed to chair. Pt. Needed RW for ambulation during treatment, pt. Was able to ambulate 15ft with RW with contact guarding/ min A; and felt fatigued towards the end of treatment. Nursing staff was notified on pt. Status. Patient will benefit from continued skilled physical therapy in hospital and recommended venue below to increase strength, balance, endurance for safe ADLs and gait.       If plan is discharge home, recommend the following: A little help with walking and/or transfers;Assistance with cooking/housework;Help with stairs or ramp for entrance   Can travel by private vehicle    Yes    Equipment Recommendations Rolling walker (2 wheels)  Recommendations for Other Services       Functional Status Assessment Patient has had a recent decline in their functional status and demonstrates the ability to make significant improvements in function in a reasonable and predictable amount of time.     Precautions / Restrictions Precautions Precautions:  Fall Recall of Precautions/Restrictions: Intact Restrictions Weight Bearing Restrictions Per Provider Order: No      Mobility  Bed Mobility Overal bed mobility: Modified Independent             General bed mobility comments: Pt. was able to get up indepedently -    Transfers Overall transfer level: Needs assistance Equipment used: Rolling walker (2 wheels) Transfers: Sit to/from Stand, Bed to chair/wheelchair/BSC Sit to Stand: Contact guard assist, Min assist   Step pivot transfers: Contact guard assist       General transfer comment: Pt. was able to use RW to WB on during transfer    Ambulation/Gait Ambulation/Gait assistance: Min assist, Contact guard assist Gait Distance (Feet): 175 Feet Assistive device: Rolling walker (2 wheels) Gait Pattern/deviations: Step-to pattern, Step-through pattern, Decreased step length - right, Decreased step length - left, Decreased stance time - right, Decreased stride length, Trunk flexed       General Gait Details: pt. was able to ambulated with RW, towards the end of ambulation pt felt fatigued. pt. had back pain during ambulation and had to take breaks in bewteen ambulation  Stairs            Wheelchair Mobility     Tilt Bed    Modified Rankin (Stroke Patients Only)       Balance Overall balance assessment: Needs assistance Sitting-balance support: Bilateral upper extremity supported Sitting balance-Leahy Scale: Good Sitting balance - Comments: Pt. was able to maintain  balance at EOB   Standing balance support: Reliant on assistive device for balance, During functional activity Standing balance-Leahy Scale: Good Standing balance comment: pt. was able to maintain static stability,  and dynamic stability w/RW.                             Pertinent Vitals/Pain Pain Assessment Pain Assessment: No/denies pain    Home Living Family/patient expects to be discharged to:: Private residence Living  Arrangements: Children Available Help at Discharge: Available 24 hours/day Type of Home: House Home Access: Stairs to enter Entrance Stairs-Rails: Right;Can reach both Entrance Stairs-Number of Steps: 3   Home Layout: One level Home Equipment: Agricultural consultant (2 wheels);Rollator (4 wheels);BSC/3in1;Grab bars - tub/shower;Grab bars - toilet Additional Comments: Still lives with daughter and granddaughter    Prior Function Prior Level of Function : Independent/Modified Independent             Mobility Comments: using a RW with community ambulation ADLs Comments: Pt. reports that daughter helps with ADLs,and IADLs     Extremity/Trunk Assessment   Upper Extremity Assessment Upper Extremity Assessment: Defer to OT evaluation    Lower Extremity Assessment Lower Extremity Assessment: Generalized weakness    Cervical / Trunk Assessment Cervical / Trunk Assessment: Kyphotic  Communication   Communication Communication: No apparent difficulties    Cognition Arousal: Alert Behavior During Therapy: WFL for tasks assessed/performed   PT - Cognitive impairments: No apparent impairments                         Following commands: Intact       Cueing Cueing Techniques: Verbal cues, Tactile cues     General Comments      Exercises     Assessment/Plan    PT Assessment Patient needs continued PT services  PT Problem List Decreased strength;Decreased range of motion;Decreased activity tolerance;Decreased balance;Decreased mobility       PT Treatment Interventions DME instruction;Gait training;Stair training;Functional mobility training;Therapeutic activities;Therapeutic exercise;Balance training;Patient/family education    PT Goals (Current goals can be found in the Care Plan section)  Acute Rehab PT Goals Patient Stated Goal: Pt. wants to return home to family and to church PT Goal Formulation: With patient Time For Goal Achievement: 12/06/23 Potential to  Achieve Goals: Good    Frequency Min 3X/week     Co-evaluation PT/OT/SLP Co-Evaluation/Treatment: Yes Reason for Co-Treatment: To address functional/ADL transfers PT goals addressed during session: Mobility/safety with mobility OT goals addressed during session: ADL's and self-care;Strengthening/ROM       AM-PAC PT 6 Clicks Mobility  Outcome Measure Help needed turning from your back to your side while in a flat bed without using bedrails?: None Help needed moving from lying on your back to sitting on the side of a flat bed without using bedrails?: A Little Help needed moving to and from a bed to a chair (including a wheelchair)?: A Little Help needed standing up from a chair using your arms (e.g., wheelchair or bedside chair)?: A Little Help needed to walk in hospital room?: A Lot Help needed climbing 3-5 steps with a railing? : A Lot 6 Click Score: 17    End of Session   Activity Tolerance: Patient tolerated treatment well;Patient limited by fatigue Patient left: in chair;with call bell/phone within reach Nurse Communication: Mobility status PT Visit Diagnosis: Unsteadiness on feet (R26.81);Repeated falls (R29.6);Muscle weakness (generalized) (M62.81)    Time: 8883-8867 PT Time Calculation (min) (ACUTE ONLY): 16 min   Charges:   PT Evaluation $PT Eval Low Complexity: 1 Low PT Treatments $Gait Training: 8-22 mins PT General Charges $$ ACUTE PT  VISIT: 1 Visit        Sam Wunschel, SPT

## 2023-12-06 NOTE — Progress Notes (Signed)
 PROGRESS NOTE    Patient: Derrick Long                            PCP: Estefana Doffing, MD                    DOB: [birth year]            DOA: 12/05/2023 FMW:[id number]             DOS: 12/06/2023, 1:05 PM   LOS: 1 day   Date of Service: The patient was seen and examined on 12/06/2023  Subjective:   The patient was seen and examined this morning. Hemodynamically stable. No issues overnight .  Brief Narrative:   WASIM HURLBUT is a 89 y.o. 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 Rogers) who presents emergency department due to abdominal pain.  Patient was recently admitted to the [facility] hospital in [place] due to abdominal pain and constipation treated with laxatives after which patient had a bowel movement and was discharged home.  However he has been having loose stools and some abdominal discomfort.  He denies fever, shortness of breath, vomiting.   ED Course:  BP was 99/72, other vital signs were within normal range.  Workup in the ED showed WBC of 3.0, hemoglobin 10.2, hematocrit 31.1, MCV 103.3.  BMP showed sodium 130, potassium 6.2, chloride 100, bicarb 20, blood glucose 98, BUN 36, creatinine 2.71 (baseline creatinine at 1.0-1.2).  Urinalysis was normal. CT abdomen and pelvis without contrast showed mild  interval growth of right adrenal nodule to 4.9 from 1.9 cm on 08/01/21; concerning for primary neoplasm or metastasis. Calcium  gluconate was given to stabilize the heart due to hyperkalemia, Lokelma was given and IV hydration was provided.  TRH was asked to admit patient.      Assessment & Plan:   Principal Problem:   Hyperkalemia Active Problems:   Essential hypertension   GERD (gastroesophageal reflux disease)   Abdominal pain   Acute kidney injury superimposed on stage 4 chronic kidney disease  (HCC)   Hyponatremia   Macrocytic anemia   Leukopenia   Generalized weakness   Prolonged QT interval   Anemia of chronic disease   Atrial fibrillation, chronic (HCC)   Urinary retention     Hyperkalemia K+ 6.2 >>>> 5.6  Calcium  gluconate was given - Will continue with gentle IV hydration - Lokelma was given, another dose this morning Continue telemetry Continue to monitor potassium levels    Hyponatremia Improving likely due to dehydration Continue gentle hydration C serum sodium level of 130, 133 Urine osmolality, serum osmolality and urine sodium will be checked   Leukopenia possibly reactive No signs of infection WBC 3.0, afebrile, normotensive Continue to monitor WBC with morning labs   Acute kidney injury superimposed on CKD 4 Dehydration Creatinine 2.71 (baseline creatinine at 1.0-1.2)-->>  2.09 Continue gentle hydration Renally adjust medications, avoid nephrotoxic agents/dehydration/hypotension   Generalized weakness Fall precaution Continue PT/OT eval and treat   Abdominal pain -POA -abdominal pain, ?  Constipation He has not had any bowel movement since arrival to the ED -Starting laxatives -As needed analgesics    Prolonged QT interval QTc 552 ms Avoid QT prolonging drugs Magnesium  2.1 Repeat EKG in the morning -Managing electrolytes   Macrocytic anemia MCV 103.3 Vitamin B12 and folate levels  will be checked   Anemia of Chronic disease Hemoglobin low at 10.2, stable Continue ferrous sulfate    Essential hypertension Continue amlodipine , Coreg    GERD Continue Protonix    Chronic atrial fibrillation Continue Coreg   Patient was not on any anticoagulant possibly due to history of GI bleed  Pelvic mass  CT abdomen and pelvis showed  interval growth of right adrenal nodule to 4.9 from 1.9 cm on 08/01/21 Metastatic prostate cancer to the right adrenal gland Patient follows with Dr. Rogers   Urinary retention Continue Flomax  twice  daily bowel regimen       ----------------------------------------------------------------------------------------------------------------------------------------------- Nutritional status:  The patient's BMI is: Body mass index is 23.04 kg/m. I agree with the assessment and plan as outlined -------------------------------------------------------------------------------------------------------------------------------------------------   ------------------------------------------------------------------------------------------------------------------------------------------------  DVT prophylaxis:  heparin  injection 5,000 Units Start: 12/06/23 0600 SCDs Start: 12/06/23 0210   Code Status:   Code Status: Limited: Do not attempt resuscitation (DNR) -DNR-LIMITED -Do Not Intubate/DNI   Family Communication: No family member present at bedside-  -Advance care planning has been discussed.   Admission status:   Status is: Inpatient Remains inpatient appropriate because: needing IVF, electrolyte normality and correction   Disposition: From  - home             Planning for discharge in 1-2 days   Procedures:   No admission procedures for hospital encounter.   Antimicrobials:  Anti-infectives (From admission, onward)    None        Medication:   amLODipine   10 mg Oral Daily   carvedilol   3.125 mg Oral BID WC   [START ON 12/07/2023] ferrous sulfate   324 mg Oral QODAY   heparin   5,000 Units Subcutaneous Q8H   pantoprazole   40 mg Oral Daily   tamsulosin   0.4 mg Oral BID    acetaminophen  **OR** acetaminophen , oxyCODONE , prochlorperazine   Objective:   Vitals:   12/05/23 2200 12/06/23 0232 12/06/23 0344 12/06/23 0612  BP: 139/69 (!) 153/64 (!) 153/64 (!) 143/90  Pulse: 60 60 60 63  Resp: 20 16 16 18   Temp:  98.1 F (36.7 C) 98.1 F (36.7 C) 97.6 F (36.4 C)  TempSrc:  Oral Oral Oral  SpO2: 98% 99%  100%  Weight:   79.2 kg   Height:   6' 1 (1.854 m)    No  intake or output data in the 24 hours ending 12/06/23 1305 Filed Weights   12/05/23 1720 12/06/23 0344  Weight: 79.4 kg 79.2 kg     Physical examination:   General:  AAO x 3,  cooperative, no distress;   HEENT:  Normocephalic, PERRL, otherwise with in Normal limits   Neuro:  CNII-XII intact. , normal motor and sensation, reflexes intact   Lungs:   Clear to auscultation BL, Respirations unlabored,  No wheezes / crackles  Cardio:    S1/S2, RRR, No murmure, No Rubs or Gallops   Abdomen:  Soft, non-tender, bowel sounds active all four quadrants, no guarding or peritoneal signs.  Muscular  skeletal:  Limited exam -global generalized weaknesses - in bed, able to move all 4 extremities,   2+ pulses,  symmetric, No pitting edema  Skin:  Dry, warm to touch, negative for any Rashes,  Wounds: Please see nursing documentation       ------------------------------------------------------------------------------------------------------------------------------------------    LABs:     Latest Ref Rng & Units 12/06/2023    4:38 AM 12/05/2023    5:51 PM 11/11/2023   10:00 AM  CBC  WBC 4.0 -  10.5 K/uL 3.3  3.0  3.2   Hemoglobin 13.0 - 17.0 g/dL 9.1  89.7  88.7   Hematocrit 39.0 - 52.0 % 27.6  31.1  34.3   Platelets 150 - 400 K/uL 241  282  226       Latest Ref Rng & Units 12/06/2023    4:38 AM 12/05/2023    5:55 PM 11/11/2023   10:00 AM  CMP  Glucose 70 - 99 mg/dL 85  98  86   BUN 8 - 23 mg/dL 32  36  17   Creatinine 0.61 - 1.24 mg/dL 7.90  7.28  8.87   Sodium 135 - 145 mmol/L 133  130  137   Potassium 3.5 - 5.1 mmol/L 5.6  6.2  4.6   Chloride 98 - 111 mmol/L 104  100  105   CO2 22 - 32 mmol/L 21  20  20    Calcium  8.9 - 10.3 mg/dL 9.2  9.5  9.3   Total Protein 6.5 - 8.1 g/dL 6.7  7.5  7.3   Total Bilirubin 0.0 - 1.2 mg/dL 0.4  0.5  1.0   Alkaline Phos 38 - 126 U/L 56  62  54   AST 15 - 41 U/L 18  19  28    ALT 0 - 44 U/L 7  7  11         Micro Results No results found for  this or any previous visit (from the past 240 hours).  Radiology Reports CT ABDOMEN PELVIS WO CONTRAST Result Date: 12/05/2023 EXAM: CT ABDOMEN AND PELVIS WITHOUT CONTRAST 12/05/2023 08:13:00 PM TECHNIQUE: CT of the abdomen and pelvis was performed without the administration of intravenous contrast. Multiplanar reformatted images are provided for review. Automated exposure control, iterative reconstruction, and/or weight-based adjustment of the mA/kV was utilized to reduce the radiation dose to as low as reasonably achievable. COMPARISON: CT 08/01/2021. CLINICAL HISTORY: Abdominal pain, acute, nonlocalized; recent abd pain admission, ongoing pain. BIB son from home for abd cramping and diarrhea. Describes as continued, recurrent. Seen here for the same previously, and admitted in the [facility] in [place] last week for the same. Returns for continued sx. Stool watery and black. States, But, they did not find any blood. Was constipated at the [facility]. Takes iron. FINDINGS: LOWER CHEST: No acute abnormality. LIVER: The liver is unremarkable. GALLBLADDER AND BILE DUCTS: Cholecystectomy. No biliary ductal dilatation. SPLEEN: No acute abnormality. PANCREAS: No acute abnormality. ADRENAL GLANDS: Increased size of the right adrenal nodule now measuring 4.9 x 2.8 cm (series 2, image 20), previously 1.1 x 1.9 cm on CT 08/01/2021. KIDNEYS, URETERS AND BLADDER: Heterogeneous exophytic mass extending from the lower pole of the right kidney measuring 2.3 cm. This is incompletely evaluated without IV contrast but is not substantially changed from 08/01/2021. 2.3 cm low-density cystic lesion in the mid pole of the right kidney. This previously demonstrated partial enhancement on 08/01/2021. Within the anterior left kidney there is a 2.2 cm lesion which previously demonstrated some enhancement. This has not changed in size. These lesions remain indeterminate. No stones in the kidneys or ureters. No hydronephrosis. No perinephric or  periureteral stranding. Urinary bladder is unremarkable. GI AND BOWEL: Stomach demonstrates no acute abnormality. Postoperative change about the right colon. There is no bowel obstruction. PERITONEUM AND RETROPERITONEUM: No ascites. No free air. VASCULATURE: Aorta is normal in caliber. Aortic atherosclerotic calcification. LYMPH NODES: No lymphadenopathy. REPRODUCTIVE ORGANS: Brachytherapy seeds in the prostate. BONES AND SOFT TISSUES: Advanced arthritis right hip. Fluid  containing right inguinal hernia. No acute osseous abnormality. No focal soft tissue abnormality. IMPRESSION: 1. Marked interval growth of right adrenal nodule to 4.9 from 1.9 cm on 08/01/21; concerning for primary neoplasm or metastasis. Recommend clinical correlation and dedicated adrenal protocol CT or MRI. 2. Indeterminate renal lesions as described. These are incompletely evaluated without IV contrast but are not substantially changed in size from 6 / 16 / 23 Electronically signed by: Norman Gatlin MD 12/05/2023 08:27 PM EDT RP Workstation: [id number]    SIGNED: Adriana DELENA Grams, MD, FHM. FAAFP. Jolynn Pack - [facility] Time spent - 55 min.  In seeing, evaluating and examining the patient. Reviewing medical records, labs, drawn plan of care. [facility],  Pager (please use [email or web address] to page/ text) Please use Epic Secure Chat for non-urgent communication (7AM-7PM)  If 7PM-7AM, please contact night-coverage [email or web address], 12/06/2023, 1:05 PM

## 2023-12-06 NOTE — Plan of Care (Signed)
  Problem: Acute Rehab PT Goals(only PT should resolve) Goal: Patient Will Transfer Sit To/From Stand Outcome: Progressing Flowsheets (Taken 12/06/2023 1409) Patient will transfer sit to/from stand: with supervision Goal: Pt Will Transfer Bed To Chair/Chair To Bed Outcome: Progressing Flowsheets (Taken 12/06/2023 1409) Pt will Transfer Bed to Chair/Chair to Bed:  with contact guard assist  with supervision Goal: Pt Will Perform Standing Balance Or Pre-Gait Outcome: Progressing Flowsheets (Taken 12/06/2023 1409) Pt will perform standing balance or pre-gait:  with contact guard assist  with Supervision Goal: Pt Will Ambulate Outcome: Progressing Flowsheets (Taken 12/06/2023 1409) Pt will Ambulate:  > 125 feet  with supervision  with contact guard assist Note: Ambulate 250 feet minimal low back pain    Ivery Cable, SPT

## 2023-12-06 NOTE — ED Notes (Signed)
 Family member given recliner and blanket/pillow.

## 2023-12-06 NOTE — Hospital Course (Addendum)
 Derrick Long is a 89 y.o. 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 Rogers) who presents emergency department due to abdominal pain.  Patient was recently admitted to the [facility] in [place] due to abdominal pain and constipation treated with laxatives after which patient had a bowel movement and was discharged home.  However he has been having loose stools and some abdominal discomfort.  He denies fever, shortness of breath, vomiting.   ED Course:  BP was 99/72, other vital signs were within normal range.  Workup in the ED showed WBC of 3.0, hemoglobin 10.2, hematocrit 31.1, MCV 103.3.  BMP showed sodium 130, potassium 6.2, chloride 100, bicarb 20, blood glucose 98, BUN 36, creatinine 2.71 (baseline creatinine at 1.0-1.2).  Urinalysis was normal. CT abdomen and pelvis without contrast showed mild  interval growth of right adrenal nodule to 4.9 from 1.9 cm on 08/01/21; concerning for primary neoplasm or metastasis. Calcium  gluconate was given to stabilize the heart due to hyperkalemia, Lokelma was given and IV hydration was provided.  TRH was asked to admit patient.      Assessment & Plan:   Principal Problem:   Hyperkalemia Active Problems:   Essential hypertension   GERD (gastroesophageal reflux disease)   Abdominal pain   Acute kidney injury superimposed on stage 4 chronic kidney disease (HCC)   Hyponatremia   Macrocytic anemia   Leukopenia   Generalized weakness   Prolonged QT interval   Anemia of chronic disease   Atrial fibrillation, chronic (HCC)   Urinary retention     Hyperkalemia K+ 6.2 >>>> 5.6  Calcium  gluconate was given - Will continue with gentle IV hydration - Lokelma was given, another dose this morning Continue telemetry Continue to monitor potassium levels    Hyponatremia Improving  likely due to dehydration Continue gentle hydration C serum sodium level of 130, 133 Urine osmolality, serum osmolality and urine sodium will be checked   Leukopenia possibly reactive No signs of infection WBC 3.0, afebrile, normotensive Continue to monitor WBC with morning labs   Acute kidney injury superimposed on CKD 4 Dehydration Creatinine 2.71 (baseline creatinine at 1.0-1.2)-->>  2.09 Continue gentle hydration Renally adjust medications, avoid nephrotoxic agents/dehydration/hypotension   Generalized weakness Fall precaution Continue PT/OT eval and treat   Abdominal pain -POA -abdominal pain, ?  Constipation He has not had any bowel movement since arrival to the ED -Starting laxatives -As needed analgesics    Prolonged QT interval QTc 552 ms Avoid QT prolonging drugs Magnesium  2.1 Repeat EKG in the morning -Managing electrolytes   Macrocytic anemia MCV 103.3 Vitamin B12 and folate levels will be checked   Anemia of Chronic disease Hemoglobin low at 10.2, stable Continue ferrous sulfate    Essential hypertension Continue amlodipine , Coreg    GERD Continue Protonix    Chronic atrial fibrillation Continue Coreg   Patient was not on any anticoagulant possibly due to history of GI bleed  Pelvic mass  CT abdomen and pelvis showed  interval growth of right adrenal nodule to 4.9 from 1.9 cm on 08/01/21 Metastatic prostate cancer to the right adrenal gland Patient follows with Dr. Rogers   Urinary retention Continue Flomax  twice daily bowel regimen

## 2023-12-06 NOTE — TOC Initial Note (Signed)
 Transition of Care [facility]) - Initial/Assessment Note    Patient Details  Name: Derrick Long MRN: [id number] Date of Birth: [birth year]  Transition of Care [facility]) CM/SW Contact:    Derrick Long Derrick Bigness, LCSW Phone Number: 12/06/2023, 12:20 PM  Clinical Narrative:                 Met with pt at bedside and spoke to daughter via t/c. Pt lives at home w/ his daughter. Pt has RW he uses at baseline and denies having current home services. Pt's son provides transportation to appointments. Pt and daughter agreeable to recommendation for [facility]. HHPT/OT has been arranged with [facility]- orders will need to be placed prior to discharge.  [facility] has been notified of pt's admission: [phone]   Expected Discharge Plan: Home w Home Health Services Barriers to Discharge: Continued Medical Work up   Patient Goals and CMS Choice Patient states their goals for this hospitalization and ongoing recovery are:: To return home CMS Medicare.gov Compare Post Acute Care list provided to:: Patient Choice offered to / list presented to : Patient [place] ownership interest in [facility].provided to::  (NA)    Expected Discharge Plan and Services In-house Referral: Clinical Social Work Discharge Planning Services: NA Post Acute Care Choice: Home Health Living arrangements for the past 2 months: Single Family Home                 DME Arranged: N/A DME Agency: NA       HH Arranged: PT, OT HH Agency: [facility] Date HH Agency Contacted: 12/06/23 Time HH Agency Contacted: 1219 Representative spoke with at [facility] Agency: Selinda  Prior Living Arrangements/Services Living arrangements for the past 2 months: Single Family Home Lives with:: Adult Children (Daughter) Patient language and need for interpreter reviewed:: Yes Do you feel safe going back to the place where you live?: Yes      Need for Family Participation in Patient Care: No (Comment) Care giver support system  in place?: Yes (comment) Current home services: DME (RW) Criminal Activity/Legal Involvement Pertinent to Current Situation/Hospitalization: No - Comment as needed  Activities of Daily Living   ADL Screening (condition at time of admission) Independently performs ADLs?: Yes (appropriate for developmental age) Is the patient deaf or have difficulty hearing?: No Does the patient have difficulty seeing, even when wearing glasses/contacts?: No Does the patient have difficulty concentrating, remembering, or making decisions?: No  Permission Sought/Granted Permission sought to share information with : Facility Medical sales representative, Family Supports Permission granted to share information with : Yes, Verbal Permission Granted  Share Information with NAME: Derrick Long (Daughter)  [phone]  Permission granted to share info w AGENCY: [facility]        Emotional Assessment Appearance:: Appears stated age Attitude/Demeanor/Rapport: Engaged Affect (typically observed): Accepting, Pleasant Orientation: : Oriented to Self, Oriented to Place, Oriented to  Time, Oriented to Situation Alcohol / Substance Use: Not Applicable Psych Involvement: No (comment)  Admission diagnosis:  Hyperkalemia [E87.5] Patient Active Problem List   Diagnosis Date Noted   Leukopenia 12/06/2023   Generalized weakness 12/06/2023   Prolonged QT interval 12/06/2023   Anemia of chronic disease 12/06/2023   Atrial fibrillation, chronic (HCC) 12/06/2023   Urinary retention 12/06/2023   Hyperkalemia 12/05/2023   Genetic testing 01/11/2023   History of kidney stones 10/07/2022   Lower urinary tract symptoms (LUTS) 10/07/2022   Pain in left shoulder 03/06/2022   Angiectasia 01/19/2022   Angiodysplasia of stomach 01/19/2022  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   Melena 10/16/2021   GI bleed 08/01/2021   Hyponatremia 08/01/2021   Macrocytic anemia 08/01/2021   Mixed hyperlipidemia 08/01/2021   Acute kidney  injury superimposed on stage 4 chronic kidney disease (HCC) 03/27/2021   DNR (do not resuscitate) 03/26/2021   CKD (chronic kidney disease), stage III (HCC) 03/26/2021   Prostate cancer (HCC) 07/10/2020   Bilateral kidney masses 06/06/2020   Adrenal mass, right 06/06/2020   Abdominal pain 09/16/2019   Pelvic lymphadenopathy 09/16/2019   Paroxysmal atrial fibrillation (HCC) 09/02/2012   HTN (hypertension) 09/02/2012   Pacemaker 08/03/2012   Gout 07/31/2012   First degree AV block, PR interval 370 ms 07/19/2012   Chest pain, atypical 07/19/2012   Syncope 07/19/2012   Bradycardia 07/19/2012   Iron deficiency anemia due to chronic blood loss 08/16/2008   OSTEOARTHRITIS, MODERATE 09/21/2007   INSOMNIA 06/24/2007   DENTAL CARIES 10/08/2006   Migraine headache 12/18/2005   Essential hypertension 12/18/2005   GERD (gastroesophageal reflux disease) 12/18/2005   LOW BACK PAIN 12/18/2005   OSTEOPENIA 12/18/2005   COLON CANCER, HX OF 12/18/2005   PCP:  Derrick Doffing, MD Pharmacy:   [facility] - [place], [place] - [address] [place] [address] Phone: [phone] Fax: [phone]  [facility] [place], [place] - [address] [place] [address] Phone: [phone] Fax: [phone]  Derrick Long - [facility] [address] [place] [address] Phone: [phone] Fax: [phone]     Social Drivers of Health (SDOH) Social History: SDOH Screenings   Food Insecurity: No Food Insecurity (12/06/2023)  Housing: Low Risk  (12/06/2023)  Transportation Needs: No Transportation Needs (12/06/2023)  Utilities: Not At Risk (12/06/2023)  Alcohol Screen: Low Risk  (05/16/2020)  Depression (PHQ2-9): Low Risk  (05/16/2020)  Financial Resource Strain: Low Risk  (05/16/2020)  Physical Activity: Insufficiently Active (05/16/2020)  Social Connections: Moderately Isolated (12/06/2023)  Stress: No Stress Concern  Present (05/16/2020)  Tobacco Use: Medium Risk (12/05/2023)   SDOH Interventions:     Readmission Risk Interventions    12/06/2023   12:15 PM 08/03/2021   11:19 AM  Readmission Risk Prevention Plan  Transportation Screening Complete Complete  PCP or Specialist Appt within 3-5 Days Complete   HRI or Home Care Consult Complete   Social Work Consult for Recovery Care Planning/Counseling Complete   Palliative Care Screening Not Applicable   Medication Review Oceanographer) Complete Complete  PCP or Specialist appointment within 3-5 days of discharge  Complete  HRI or Home Care Consult  Complete  SW Recovery Care/Counseling Consult  Complete  Palliative Care Screening  Not Applicable  Skilled Nursing Facility  Not Applicable

## 2023-12-07 DIAGNOSIS — E875 Hyperkalemia: Secondary | ICD-10-CM | POA: Diagnosis not present

## 2023-12-07 DIAGNOSIS — Z515 Encounter for palliative care: Secondary | ICD-10-CM | POA: Diagnosis not present

## 2023-12-07 DIAGNOSIS — Z66 Do not resuscitate: Secondary | ICD-10-CM | POA: Diagnosis not present

## 2023-12-07 DIAGNOSIS — Z7189 Other specified counseling: Secondary | ICD-10-CM | POA: Diagnosis not present

## 2023-12-07 LAB — CBC WITH DIFFERENTIAL/PLATELET
Abs Immature Granulocytes: 0 K/uL (ref 0.00–0.07)
Basophils Absolute: 0 K/uL (ref 0.0–0.1)
Basophils Relative: 1 %
Eosinophils Absolute: 0.3 K/uL (ref 0.0–0.5)
Eosinophils Relative: 10 %
HCT: 30.4 % — ABNORMAL LOW (ref 39.0–52.0)
Hemoglobin: 10 g/dL — ABNORMAL LOW (ref 13.0–17.0)
Immature Granulocytes: 0 %
Lymphocytes Relative: 52 %
Lymphs Abs: 1.5 K/uL (ref 0.7–4.0)
MCH: 33.9 pg (ref 26.0–34.0)
MCHC: 32.9 g/dL (ref 30.0–36.0)
MCV: 103.1 fL — ABNORMAL HIGH (ref 80.0–100.0)
Monocytes Absolute: 0.3 K/uL (ref 0.1–1.0)
Monocytes Relative: 11 %
Neutro Abs: 0.7 K/uL — ABNORMAL LOW (ref 1.7–7.7)
Neutrophils Relative %: 26 %
Platelets: 277 K/uL (ref 150–400)
RBC: 2.95 MIL/uL — ABNORMAL LOW (ref 4.22–5.81)
RDW: 12.8 % (ref 11.5–15.5)
Smear Review: NORMAL
WBC: 2.8 K/uL — ABNORMAL LOW (ref 4.0–10.5)
nRBC: 0 % (ref 0.0–0.2)

## 2023-12-07 LAB — COMPREHENSIVE METABOLIC PANEL WITH GFR
ALT: 7 U/L (ref 0–44)
AST: 19 U/L (ref 15–41)
Albumin: 4 g/dL (ref 3.5–5.0)
Alkaline Phosphatase: 59 U/L (ref 38–126)
Anion gap: 9 (ref 5–15)
BUN: 29 mg/dL — ABNORMAL HIGH (ref 8–23)
CO2: 22 mmol/L (ref 22–32)
Calcium: 9.7 mg/dL (ref 8.9–10.3)
Chloride: 102 mmol/L (ref 98–111)
Creatinine, Ser: 1.75 mg/dL — ABNORMAL HIGH (ref 0.61–1.24)
GFR, Estimated: 35 mL/min — ABNORMAL LOW (ref >60)
Glucose, Bld: 86 mg/dL (ref 70–99)
Potassium: 5.9 mmol/L — ABNORMAL HIGH (ref 3.5–5.1)
Sodium: 133 mmol/L — ABNORMAL LOW (ref 135–145)
Total Bilirubin: 0.6 mg/dL (ref 0.0–1.2)
Total Protein: 7.4 g/dL (ref 6.5–8.1)

## 2023-12-07 MED ORDER — SODIUM ZIRCONIUM CYCLOSILICATE 10 G PO PACK
10.0000 g | PACK | Freq: Every day | ORAL | Status: DC
  Administered 2023-12-07 – 2023-12-10 (×4): 10 g via ORAL
  Filled 2023-12-07 (×4): qty 1

## 2023-12-07 MED ORDER — MAGNESIUM CITRATE PO SOLN: 1.0000 | Freq: Once | ORAL | Status: DC

## 2023-12-07 MED ORDER — SENNA 8.6 MG PO TABS
1.0000 | ORAL_TABLET | Freq: Every day | ORAL | Status: DC
  Administered 2023-12-07 – 2023-12-09 (×3): 8.6 mg via ORAL
  Filled 2023-12-07 (×3): qty 1

## 2023-12-07 NOTE — TOC Progression Note (Signed)
 Transition of Care [facility]) - Progression Note    Patient Details  Name: Derrick Long MRN: [id number] Date of Birth: 1927-02-16  [facility] CM/SW Contact  Sharlyne Stabs, RN Phone Number: 12/07/2023, 12:03 PM  Clinical Narrative:   Palliative consulted IPCM to refer patient to Out Patient Palliative services. Referral sent to [facility] and add to AVS for family to follow up.     Expected Discharge Plan: Home w Home Health Services Barriers to Discharge: Continued Medical Work up      Expected Discharge Plan and Services In-house Referral: Clinical Social Work Discharge Planning Services: NA Post Acute Care Choice: Home Health Living arrangements for the past 2 months: Single Family Home                 DME Arranged: N/A DME Agency: NA       HH Arranged: PT, OT HH Agency: [facility] Date HH Agency Contacted: 12/06/23 Time HH Agency Contacted: 1219 Representative spoke with at [facility] Agency: Selinda   Social Drivers of Health (SDOH) Interventions SDOH Screenings   Food Insecurity: No Food Insecurity (12/06/2023)  Housing: Low Risk  (12/06/2023)  Transportation Needs: No Transportation Needs (12/06/2023)  Utilities: Not At Risk (12/06/2023)  Alcohol Screen: Low Risk  (05/16/2020)  Depression (PHQ2-9): Low Risk  (05/16/2020)  Financial Resource Strain: Low Risk  (05/16/2020)  Physical Activity: Insufficiently Active (05/16/2020)  Social Connections: Moderately Isolated (12/06/2023)  Stress: No Stress Concern Present (05/16/2020)  Tobacco Use: Medium Risk (12/05/2023)    Readmission Risk Interventions    12/06/2023   12:15 PM 08/03/2021   11:19 AM  Readmission Risk Prevention Plan  Transportation Screening Complete Complete  PCP or Specialist Appt within 3-5 Days Complete   HRI or Home Care Consult Complete   Social Work Consult for Recovery Care Planning/Counseling Complete   Palliative Care Screening Not Applicable   Medication  Review Oceanographer) Complete Complete  PCP or Specialist appointment within 3-5 days of discharge  Complete  HRI or Home Care Consult  Complete  SW Recovery Care/Counseling Consult  Complete  Palliative Care Screening  Not Applicable  Skilled Nursing Facility  Not Applicable

## 2023-12-07 NOTE — Plan of Care (Signed)
   Problem: Education: Goal: Knowledge of General Education information will improve Description: Including pain rating scale, medication(s)/side effects and non-pharmacologic comfort measures Outcome: Progressing   Problem: Clinical Measurements: Goal: Ability to maintain clinical measurements within normal limits will improve Outcome: Progressing Goal: Diagnostic test results will improve Outcome: Progressing

## 2023-12-07 NOTE — Progress Notes (Signed)
 Mobility Specialist Progress Note:    12/07/23 0905  Mobility  Activity Ambulated with assistance  Level of Assistance Standby assist, set-up cues, supervision of patient - no hands on  Assistive Device Front wheel walker  Distance Ambulated (ft) 40 ft  Range of Motion/Exercises Active;All extremities  Activity Response Tolerated well  Mobility Referral Yes  Mobility visit 1 Mobility  Mobility Specialist Start Time (ACUTE ONLY) [id number]  Mobility Specialist Stop Time (ACUTE ONLY) 0925  Mobility Specialist Time Calculation (min) (ACUTE ONLY) 20 min   Pt received in bed, agreeable to mobility. Required SBA to stand and ambulate with RW. Tolerated well, weakness during ambulation. Left in chair, all needs met.  Derrick Long Mobility Specialist Please contact via Special educational needs teacher or  Rehab office at [phone]

## 2023-12-07 NOTE — Progress Notes (Signed)
 Daily Progress Note   Date: 12/07/2023   Patient Name: Derrick Long  DOB: [birth year]  MRN: [id number]  Age / Sex: 89 y.o., male  Attending Physician: Willette Adriana LABOR, MD Primary Care Physician: Estefana Doffing, MD Admit Date: 12/05/2023 Length of Stay: 2 days  Reason for Follow-up: Establishing goals of care  Past Medical History:  Diagnosis Date   Acid reflux    Acute colitis 09/16/2019   AKI (acute kidney injury) 07/31/2012   Anemia    BENIGN PROSTATIC HYPERTROPHY, WITH OBSTRUCTION 12/18/2005   Qualifier: Diagnosis of  By: Olene Dodrill     Chest pain 07/31/2012   Colitis 09/16/2019   Colon cancer (HCC)    per patient, colon cancer more than 10 years ago s/p right hemicolectomy   Elevated bilirubin    Elevated LFTs    Ex-cigarette smoker    Gout    Hyperlipemia    Hypertension    Hyponatremia 08/01/2021   Intractable abdominal pain 02/03/2021   LATERAL MENISCUS TEAR, RIGHT 04/19/2006   Qualifier: Diagnosis of  By: Karren MD, Cornelius     Leukocytosis, unspecified 07/31/2012   Paroxysmal atrial fibrillation (HCC) 2014   Presence of permanent cardiac pacemaker    Prostate cancer (HCC) 08/08/2013   SEEDS 20 YEARS AGO   Prostate disease    RENAL CALCULUS 12/18/2005   Qualifier: Diagnosis of  By: Olene Dodrill     RUQ pain    UTI (urinary tract infection) 03/26/2021    Subjective:   Subjective: Chart Reviewed. Updates received. Patient Assessed. Created space and opportunity for patient  and family to explore thoughts and feelings regarding current medical situation.  Today's Discussion: Today before meeting with the patient/family, I reviewed the chart notes including OT note from yesterday, nursing note from yesterday.  Later today I also reviewed hospitalist note from today, nursing note from today, hospitalist note from today, TOC note from today. I also reviewed vital signs, nursing flowsheets, medication administrations record, labs, and imaging.  Labs reviewed include CBC which shows progressive decline in white count from 3.3 yesterday to 2.8 today in the setting of no obvious infection but apparently progressive cancer; hemoglobin stable at 10.0 in the setting of chronic kidney disease and chronic anemia as far as records in the system go through November 2020.  CMP shows stable hyponatremia, persistent hyperkalemia in the setting of acute hyperkalemia status post treatment during hospitalization.  AFP tumor marker, CEA, cancer antigen 19-9 pending.   Today saw the patient at bedside, his daughter was present.  The patient was sitting in the bedside chair.  He denies pain, nausea, vomiting.  He states that he got up and ambulated today but only a little bit before becoming tired.  I encouraged him to continue ambulation attempts to work on strengthening.  We talked about how acute illness can knock you down a PEG and cause some weakness.  I shared that there has been a consult placed for oncology to see him while he was in the hospital.  We discussed the need for further information including possible treatment options, or information if he is not a option for further cancer treatment.  Once we have this information it can better inform our ongoing conversations about goals of care.  We again mentioned that there is a possibility that there may not be options for ongoing cancer treatment, but that would allow us  to talk about how to care for him if the end-of-life is approaching.  Patient and daughter  both seem to understand this and are in agreement.  The patient states that he was told he may discharge tomorrow.  I shared that if we are unable to get information to engage in further goals of care prior to discharge, I recommend highly that for outpatient palliative care to see the patient.  He and his daughter are both in agreement.  At the end of her conversation we confirmed current goals of care and plan including DNR/DNI, treat the  treatable for acute situation, get oncology back involved, have ongoing conversations based on information from oncology.  All are in agreement.  I provided emotional and general support through therapeutic listening, empathy, sharing of stories, and other techniques. I answered all questions and addressed all concerns to the best of my ability.  Review of Systems  Constitutional:  Positive for fatigue.       Denies pain in general  Respiratory:  Negative for cough and shortness of breath.   Cardiovascular:  Negative for chest pain.  Gastrointestinal:  Negative for abdominal pain, nausea and vomiting.  Neurological:  Positive for weakness.    Objective:   Primary Diagnoses: Present on Admission:  Hyperkalemia  Hyponatremia  Acute kidney injury superimposed on stage 4 chronic kidney disease (HCC)  Abdominal pain  Macrocytic anemia  Essential hypertension  GERD (gastroesophageal reflux disease)   Vital Signs:  BP (!) 140/77 (BP Location: Right Arm)   Pulse 63   Temp 98.4 F (36.9 C) (Oral)   Resp 20   Ht 6' 1 (1.854 m)   Wt 79.2 kg   SpO2 96%   BMI 23.04 kg/m   Physical Exam Vitals and nursing note reviewed.  Constitutional:      General: He is not in acute distress.    Appearance: He is ill-appearing.  HENT:     Head: Normocephalic and atraumatic.  Cardiovascular:     Rate and Rhythm: Normal rate.  Pulmonary:     Effort: Pulmonary effort is normal. No respiratory distress.  Abdominal:     General: Abdomen is flat.  Skin:    General: Skin is warm and dry.  Neurological:     General: No focal deficit present.     Mental Status: He is alert and oriented to person, place, and time.  Psychiatric:        Mood and Affect: Mood normal.        Behavior: Behavior normal.     Palliative Assessment/Data: 60%   Existing Vynca/ACP Documentation: None  Assessment & Plan:   HPI/Patient Profile:  89 y.o. 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 Rogers) who presents emergency department due to abdominal pain. He was admitted on 12/05/2023 with hyperkalemia, hyponatremia, leukopenia, AKI on CKD, generalized weakness, abdominal pain, constipation, pelvic mass/metastatic prostate cancer, and others.    Palliative medicine was consulted for GOC conversations.  SUMMARY OF RECOMMENDATIONS   DNR-Limited Continue to treat the treatable Await input from oncology Ongoing goals of care ending information from oncology Recommend outpatient palliative care, [facility] consult placed Palliative medicine will continue to follow  Symptom Management:  Per primary team Palliative medicine is available to assist as needed  Code Status: DNR - Limited (DNR/DNI)  Prognosis: Unable to determine  Discharge Planning: To Be Determined  Discussed with: Patient, medical team, nursing team, [facility] team  Thank you for allowing us  to participate  in the care of SHELBY ANDERLE PMT will continue to support holistically.  Billing based on MDM: Moderate  Detailed review of medical records (labs, imaging, vital signs), medically appropriate exam, discussed with treatment team, counseling and education to patient, family, & staff, documenting clinical information, medication management, coordination of care  Camellia Kays, NP Palliative Medicine Team  Team Phone # [phone] (Nights/Weekends)  10/15/2020, 8:17 AM

## 2023-12-07 NOTE — Plan of Care (Signed)
  Problem: Education: Goal: Knowledge of General Education information will improve Description: Including pain rating scale, medication(s)/side effects and non-pharmacologic comfort measures Outcome: Adequate for Discharge   Problem: Health Behavior/Discharge Planning: Goal: Ability to manage health-related needs will improve Outcome: Adequate for Discharge   Problem: Clinical Measurements: Goal: Ability to maintain clinical measurements within normal limits will improve Outcome: Adequate for Discharge Goal: Will remain free from infection Outcome: Adequate for Discharge Goal: Diagnostic test results will improve Outcome: Adequate for Discharge Goal: Respiratory complications will improve Outcome: Adequate for Discharge Goal: Cardiovascular complication will be avoided Outcome: Adequate for Discharge   Problem: Activity: Goal: Risk for activity intolerance will decrease Outcome: Progressing   Problem: Nutrition: Goal: Adequate nutrition will be maintained Outcome: Adequate for Discharge   Problem: Coping: Goal: Level of anxiety will decrease Outcome: Adequate for Discharge   Problem: Elimination: Goal: Will not experience complications related to bowel motility Outcome: Progressing Goal: Will not experience complications related to urinary retention Outcome: Not Applicable   Problem: Pain Managment: Goal: General experience of comfort will improve and/or be controlled Outcome: Progressing   Problem: Safety: Goal: Ability to remain free from injury will improve Outcome: Not Applicable   Problem: Skin Integrity: Goal: Risk for impaired skin integrity will decrease Outcome: Progressing

## 2023-12-07 NOTE — Progress Notes (Signed)
   12/07/23 1100  Spiritual Encounters  Type of Visit Initial  Care provided to: Family  Referral source Other (comment) (Spiritual Consult)  Reason for visit Routine spiritual support  OnCall Visit No   Chaplain responded to a spiritual consult for support. I met with the patient's daughter, Alfonso she would like us  to keep them in prayer. Alfonso shared that they hoped to return home tomorrow. Her father Zhane was resting. He had done some good work today.  I assured her they would be kept in prayer as I departed.   Carley Birmingham [facility]  [phone]

## 2023-12-07 NOTE — Progress Notes (Signed)
 PROGRESS NOTE    Patient: Derrick Long                            PCP: Estefana Doffing, MD                    DOB: [birth year]            DOA: 12/05/2023 FMW:[id number]             DOS: 12/07/2023, 11:19 AM   LOS: 2 days   Date of Service: The patient was seen and examined on 12/07/2023  Subjective:   The patient was seen and examined this morning, stable no acute distress Dynamically stable No issues overnight  Brief Narrative:   Derrick Long is a 89 y.o. 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 Rogers) who presents emergency department due to abdominal pain.  Patient was recently admitted to the [facility] hospital in [place] due to abdominal pain and constipation treated with laxatives after which patient had a bowel movement and was discharged home.  However he has been having loose stools and some abdominal discomfort.  He denies fever, shortness of breath, vomiting.   ED Course:  BP was 99/72, other vital signs were within normal range.  Workup in the ED showed WBC of 3.0, hemoglobin 10.2, hematocrit 31.1, MCV 103.3.  BMP showed sodium 130, potassium 6.2, chloride 100, bicarb 20, blood glucose 98, BUN 36, creatinine 2.71 (baseline creatinine at 1.0-1.2).  Urinalysis was normal. CT abdomen and pelvis without contrast showed mild  interval growth of right adrenal nodule to 4.9 from 1.9 cm on 08/01/21; concerning for primary neoplasm or metastasis. Calcium  gluconate was given to stabilize the heart due to hyperkalemia, Lokelma was given and IV hydration was provided.  TRH was asked to admit patient.      Assessment & Plan:   Principal Problem:   Hyperkalemia Active Problems:   Essential hypertension   GERD (gastroesophageal reflux disease)   Abdominal pain   Acute kidney injury superimposed on stage 4 chronic  kidney disease (HCC)   Hyponatremia   Macrocytic anemia   Leukopenia   Generalized weakness   Prolonged QT interval   Anemia of chronic disease   Atrial fibrillation, chronic (HCC)   Urinary retention     Hyperkalemia -resistant K+ 6.2 >>>> 5.6 >>> 5.9 - ED was treated with calcium  gluconate - Will continue with gentle IV hydration - Lokelma 10 mg will continue daily for now -Heart rate on telemetry, monitoring electrolyte levels   Hyponatremia Azotemia, exacerbated by dehydration Improving, continue gentle hydration serum sodium level of 130, 133    Leukopenia -possibly reactive With underlying pelvic mass-possibly cancer No signs of infection WBC 3.0, 2.8, afebrile, normotensive Continue to monitor WBC with morning labs   Acute kidney injury superimposed on CKD 4 Dehydration Creatinine 2.71 (baseline creatinine at 1.0-1.2)-->>  2.09, 1.75 Continue gentle hydration Renally adjust medications, avoid nephrotoxic agents/dehydration/hypotension   Generalized weakness Fall precaution Continue PT/OT eval and treat   Abdominal pain -POA -abdominal pain, w  Constipation -Continue bowel regimen CT abdomen increased size of abdominal mass right adrenal nodule to 4.9 from 1.9 cm     Prolonged QT interval QTc 552 ms Avoid QT prolonging drugs Magnesium  2.1 with hyperkalemia Repeat EKG in the morning -Managing electrolytes  Macrocytic anemia MCV 103.3 Vitamin B12 and folate levels will be checked   Anemia of Chronic disease Hemoglobin low at 10.2, stable Continue ferrous sulfate    Essential hypertension -Continue amlodipine , Coreg    GERD - Continue Protonix    Chronic atrial fibrillation - Continue Coreg   Patient was not on any anticoagulant possibly due to history of GI bleed  Pelvic mass  CT abdomen and pelvis showed  interval growth of right adrenal nodule to 4.9 from 1.9 cm on 08/01/21 Metastatic prostate cancer to the right adrenal gland Patient  follows with Dr. Rogers Palliative care team consulted, DNR/DNI confirmed Patient and family would like to follow-up with oncologist to weigh and their options regarding treatment   Urinary retention Continue Flomax  twice daily bowel regimen    ------------------------------------------------------------------------------------------------------------------------- Nutritional status:  The patient's BMI is: Body mass index is 23.04 kg/m. I agree with the assessment and plan as outlined -------------------------------------------------------------------------------------------------------------------------------------------------   ------------------------------------------------------------------------------------------------------------------------------------------------  DVT prophylaxis:  heparin  injection 5,000 Units Start: 12/06/23 0600 SCDs Start: 12/06/23 0210   Code Status:   Code Status: Limited: Do not attempt resuscitation (DNR) -DNR-LIMITED -Do Not Intubate/DNI   Family Communication: No family member present at bedside-  -Advance care planning has been discussed.   Admission status:   Status is: Inpatient Remains inpatient appropriate because: needing IVF, electrolyte normality and correction   Disposition: From  - home             Planning for discharge in 1-2 days   Procedures:   No admission procedures for hospital encounter.   Antimicrobials:  Anti-infectives (From admission, onward)    None        Medication:   amLODipine   10 mg Oral Daily   carvedilol   3.125 mg Oral BID WC   ferrous sulfate   324 mg Oral QODAY   heparin   5,000 Units Subcutaneous Q8H   magnesium  citrate  1 Bottle Oral Once   pantoprazole   40 mg Oral Daily   senna  1 tablet Oral QHS   sodium zirconium cyclosilicate  10 g Oral Daily   tamsulosin   0.4 mg Oral BID    acetaminophen  **OR** acetaminophen , oxyCODONE , prochlorperazine   Objective:   Vitals:   12/06/23  0612 12/06/23 1349 12/06/23 2047 12/07/23 0442  BP: (!) 143/90 (!) 111/52 (!) 115/54 (!) 140/77  Pulse: 63 (!) 59 61 63  Resp: 18  19 20   Temp: 97.6 F (36.4 C) 97.7 F (36.5 C) 98.3 F (36.8 C) 98.4 F (36.9 C)  TempSrc: Oral Oral Oral Oral  SpO2: 100% 96% 100% 96%  Weight:      Height:       No intake or output data in the 24 hours ending 12/07/23 1119 Filed Weights   12/05/23 1720 12/06/23 0344  Weight: 79.4 kg 79.2 kg     Physical examination:    General:  AAO x 3,  cooperative, no distress;   HEENT:  Normocephalic, PERRL, otherwise with in Normal limits   Neuro:  CNII-XII intact. , normal motor and sensation, reflexes intact   Lungs:   Clear to auscultation BL, Respirations unlabored,  No wheezes / crackles  Cardio:    S1/S2, RRR, No murmure, No Rubs or Gallops   Abdomen:  Soft, non-tender, bowel sounds active all four quadrants, no guarding or peritoneal signs.  Muscular  skeletal:  Limited exam -global generalized weaknesses - in bed, able to move all 4 extremities,   2+ pulses,  symmetric, No pitting edema  Skin:  Dry,  warm to touch, negative for any Rashes,  Wounds: Please see nursing documentation    --------------------------------------------------------------------------------------------------------------------------    LABs:     Latest Ref Rng & Units 12/07/2023    8:39 AM 12/06/2023    4:38 AM 12/05/2023    5:51 PM  CBC  WBC 4.0 - 10.5 K/uL 2.8  3.3  3.0   Hemoglobin 13.0 - 17.0 g/dL 89.9  9.1  89.7   Hematocrit 39.0 - 52.0 % 30.4  27.6  31.1   Platelets 150 - 400 K/uL 277  241  282       Latest Ref Rng & Units 12/07/2023    8:39 AM 12/06/2023    4:38 AM 12/05/2023    5:55 PM  CMP  Glucose 70 - 99 mg/dL 86  85  98   BUN 8 - 23 mg/dL 29  32  36   Creatinine 0.61 - 1.24 mg/dL 8.24  7.90  7.28   Sodium 135 - 145 mmol/L 133  133  130   Potassium 3.5 - 5.1 mmol/L 5.9  5.6  6.2   Chloride 98 - 111 mmol/L 102  104  100   CO2 22 - 32 mmol/L 22   21  20    Calcium  8.9 - 10.3 mg/dL 9.7  9.2  9.5   Total Protein 6.5 - 8.1 g/dL 7.4  6.7  7.5   Total Bilirubin 0.0 - 1.2 mg/dL 0.6  0.4  0.5   Alkaline Phos 38 - 126 U/L 59  56  62   AST 15 - 41 U/L 19  18  19    ALT 0 - 44 U/L 7  7  7         Micro Results No results found for this or any previous visit (from the past 240 hours).  Radiology Reports No results found.   SIGNED: Adriana DELENA Grams, MD, FHM. FAAFP. Jolynn Pack - [facility] Time spent - 55 min.  In seeing, evaluating and examining the patient. Reviewing medical records, labs, drawn plan of care. [facility],  Pager (please use [email or web address] to page/ text) Please use Epic Secure Chat for non-urgent communication (7AM-7PM)  If 7PM-7AM, please contact night-coverage [email or web address], 12/07/2023, 11:19 AM

## 2023-12-07 NOTE — Consult Note (Signed)
 [facility] Consultation Oncology  Name: Derrick Long      MRN: [id number]   Location: A336/A336-01  Date:     REFERRING PHYSICIAN:  Dr. Adra  REASON FOR CONSULT: Metastatic prostate cancer.    HISTORY OF PRESENT ILLNESS:   Derrick Long is a 89 y.o. male with past medical history of of metastatic prostate cancer with mets to the adrenal gland.  Patient has been on several treatments for prostate cancer including Darzalex with transition to Eligard  on 08/21/2020.  He was on Abiraterone  started on 01/22/2021 through 05/17/2021.  He was not a candidate for Erleada due to blood thinner.  He was started on darolutamide  on 06/19/2021.  He receives Eligard  every 6 months last in Feb 2025.  He stopped darolutamide  in 2024 due to progression of disease.  Patient had Guardant360 which did not reveal any reportable mutations. Germline testing negative.  Unfortunately, there are no additional treatments to offer for this patient.  Patient is not a candidate for chemotherapy.  Palliative/hospice.  Most recently, patient was admitted for abdominal pain  with excessive diarrhea and very little oral intake.  He has acute renal failure and hyperkalemia.  Most recent CT abdomen pelvis without contrast showed marked interval growth of right adrenal nodule to 4.9 cm from 1.9 cm.  Overall, patient is doing well.  He is with his daughter.  Reports his abdominal pain has improved.  He is ambulating with a walker.  Reports the pain medicine he was given in the hospital is helping.  He previously had tramadol  which was not effective.  MEDICATIONS: I have reviewed the patient's current medications.     PERFORMANCE STATUS: The patient's performance status is 2 - Symptomatic, <50% confined to bed  PHYSICAL EXAM: Most Recent Vital Signs: Blood pressure (!) 134/103, pulse 92, temperature 97.8 F (36.6 C), temperature source Oral, resp. rate 12, height 6' (1.829 m), weight 270 lb (122.5 kg), SpO2  91%.  GENERAL:alert, no distress and comfortable SKIN: skin color, texture, turgor are normal, no rashes or significant lesions EYES: normal, conjunctiva are pink and non-injected, sclera clear OROPHARYNX:no exudate, no erythema and lips, buccal mucosa, and tongue normal  NECK: supple, thyroid normal size, non-tender, without nodularity LYMPH:  no palpable lymphadenopathy in the cervical, axillary or inguinal LUNGS: clear to auscultation and percussion with normal breathing effort HEART: regular rate & rhythm and no murmurs and no lower extremity edema ABDOMEN:abdomen soft, non-tender and normal bowel sounds Musculoskeletal:no cyanosis of digits and no clubbing  PSYCH: alert & oriented x 3 with fluent speech NEURO: no focal motor/sensory deficits    LABORATORY DATA:   Last CBC Lab Results  Component Value Date   WBC 2.8 (L) 12/07/2023   HGB 10.0 (L) 12/07/2023   HCT 30.4 (L) 12/07/2023   MCV 103.1 (H) 12/07/2023   MCH 33.9 12/07/2023   RDW 12.8 12/07/2023   PLT 277 12/07/2023     Last metabolic panel Lab Results  Component Value Date   GLUCOSE 86 12/07/2023   NA 133 (L) 12/07/2023   K 5.9 (H) 12/07/2023   CL 102 12/07/2023   CO2 22 12/07/2023   BUN 29 (H) 12/07/2023   CREATININE 1.75 (H) 12/07/2023   GFRNONAA 35 (L) 12/07/2023   CALCIUM  9.7 12/07/2023   PHOS 3.9 12/06/2023   PROT 7.4 12/07/2023   ALBUMIN 4.0 12/07/2023   BILITOT 0.6 12/07/2023   ALKPHOS 59 12/07/2023   AST 19 12/07/2023   ALT 7 12/07/2023  ANIONGAP 9 12/07/2023      RADIOGRAPHY: CT ABDOMEN PELVIS WO CONTRAST EXAM: CT ABDOMEN AND PELVIS WITHOUT CONTRAST 12/05/2023 08:13:00 PM  TECHNIQUE: CT of the abdomen and pelvis was performed without the administration of intravenous contrast. Multiplanar reformatted images are provided for review. Automated exposure control, iterative reconstruction, and/or weight-based adjustment of the mA/kV was utilized to reduce the radiation dose to as low  as reasonably achievable.  COMPARISON: CT 08/01/2021.  CLINICAL HISTORY: Abdominal pain, acute, nonlocalized; recent abd pain admission, ongoing pain. BIB son from home for abd cramping and diarrhea. Describes as continued, recurrent. Seen here for the same previously, and admitted in the [facility] in [place] last week for the same. Returns for continued sx. Stool watery and black. States, But, they did not find any blood. Was constipated at the [facility]. Takes iron.  FINDINGS:  LOWER CHEST: No acute abnormality.  LIVER: The liver is unremarkable.  GALLBLADDER AND BILE DUCTS: Cholecystectomy. No biliary ductal dilatation.  SPLEEN: No acute abnormality.  PANCREAS: No acute abnormality.  ADRENAL GLANDS: Increased size of the right adrenal nodule now measuring 4.9 x 2.8 cm (series 2, image 20), previously 1.1 x 1.9 cm on CT 08/01/2021.  KIDNEYS, URETERS AND BLADDER: Heterogeneous exophytic mass extending from the lower pole of the right kidney measuring 2.3 cm. This is incompletely evaluated without IV contrast but is not substantially changed from 08/01/2021. 2.3 cm low-density cystic lesion in the mid pole of the right kidney. This previously demonstrated partial enhancement on 08/01/2021. Within the anterior left kidney there is a 2.2 cm lesion which previously demonstrated some enhancement. This has not changed in size. These lesions remain indeterminate. No stones in the kidneys or ureters. No hydronephrosis. No perinephric or periureteral stranding. Urinary bladder is unremarkable.  GI AND BOWEL: Stomach demonstrates no acute abnormality. Postoperative change about the right colon. There is no bowel obstruction.  PERITONEUM AND RETROPERITONEUM: No ascites. No free air.  VASCULATURE: Aorta is normal in caliber. Aortic atherosclerotic calcification.  LYMPH NODES: No lymphadenopathy.  REPRODUCTIVE ORGANS: Brachytherapy seeds in the prostate.  BONES AND SOFT  TISSUES: Advanced arthritis right hip. Fluid containing right inguinal hernia. No acute osseous abnormality. No focal soft tissue abnormality.  IMPRESSION: 1. Marked interval growth of right adrenal nodule to 4.9 from 1.9 cm on 08/01/21; concerning for primary neoplasm or metastasis. Recommend clinical correlation and dedicated adrenal protocol CT or MRI. 2. Indeterminate renal lesions as described. These are incompletely evaluated without IV contrast but are not substantially changed in size from 6 / 16 / 23  Electronically signed by: Norman Gatlin MD 12/05/2023 08:27 PM EDT RP Workstation: [id number]     ASSESSMENT:   PLAN:  Metastatic prostate Cancer with mets to adrenal gland - Discussed guardiant 360 and germline testing which did not reveal any actionable mutations.  No additional treatments at this time. - We discussed palliative care outpatient to help with pain management and other symptoms stemming from prostate cancer.  Will place referral. - Labs show pancytopenia concerning for bone marrow involvement.  Dr. Davonna to see patient. - Discussed nutritional workup while in the hospital including iron levels, B12, folate, copper levels.  No bleeding per patient. - Pain management. - Recommend follow-up with oncology in the next month.   Thank you for involving us  in this patient's care.  Please to reach out with any questions or concerns.  Delon Hope, NP Hematology/Oncology [facility]

## 2023-12-08 DIAGNOSIS — Z7189 Other specified counseling: Secondary | ICD-10-CM | POA: Diagnosis not present

## 2023-12-08 DIAGNOSIS — Z66 Do not resuscitate: Secondary | ICD-10-CM | POA: Diagnosis not present

## 2023-12-08 DIAGNOSIS — E875 Hyperkalemia: Secondary | ICD-10-CM | POA: Diagnosis not present

## 2023-12-08 DIAGNOSIS — Z515 Encounter for palliative care: Secondary | ICD-10-CM | POA: Diagnosis not present

## 2023-12-08 LAB — COMPREHENSIVE METABOLIC PANEL WITH GFR
ALT: 6 U/L (ref 0–44)
AST: 17 U/L (ref 15–41)
Albumin: 3.6 g/dL (ref 3.5–5.0)
Alkaline Phosphatase: 52 U/L (ref 38–126)
Anion gap: 9 (ref 5–15)
BUN: 29 mg/dL — ABNORMAL HIGH (ref 8–23)
CO2: 21 mmol/L — ABNORMAL LOW (ref 22–32)
Calcium: 9.2 mg/dL (ref 8.9–10.3)
Chloride: 102 mmol/L (ref 98–111)
Creatinine, Ser: 1.8 mg/dL — ABNORMAL HIGH (ref 0.61–1.24)
GFR, Estimated: 34 mL/min — ABNORMAL LOW (ref >60)
Glucose, Bld: 81 mg/dL (ref 70–99)
Potassium: 5.8 mmol/L — ABNORMAL HIGH (ref 3.5–5.1)
Sodium: 132 mmol/L — ABNORMAL LOW (ref 135–145)
Total Bilirubin: 0.5 mg/dL (ref 0.0–1.2)
Total Protein: 6.7 g/dL (ref 6.5–8.1)

## 2023-12-08 LAB — CBC WITH DIFFERENTIAL/PLATELET
Abs Immature Granulocytes: 0 K/uL (ref 0.00–0.07)
Basophils Absolute: 0 K/uL (ref 0.0–0.1)
Basophils Relative: 1 %
Eosinophils Absolute: 0.3 K/uL (ref 0.0–0.5)
Eosinophils Relative: 8 %
HCT: 28.2 % — ABNORMAL LOW (ref 39.0–52.0)
Hemoglobin: 9.3 g/dL — ABNORMAL LOW (ref 13.0–17.0)
Immature Granulocytes: 0 %
Lymphocytes Relative: 57 %
Lymphs Abs: 2.2 K/uL (ref 0.7–4.0)
MCH: 33.6 pg (ref 26.0–34.0)
MCHC: 33 g/dL (ref 30.0–36.0)
MCV: 101.8 fL — ABNORMAL HIGH (ref 80.0–100.0)
Monocytes Absolute: 0.5 K/uL (ref 0.1–1.0)
Monocytes Relative: 12 %
Neutro Abs: 0.8 K/uL — ABNORMAL LOW (ref 1.7–7.7)
Neutrophils Relative %: 22 %
Platelets: 233 K/uL (ref 150–400)
RBC: 2.77 MIL/uL — ABNORMAL LOW (ref 4.22–5.81)
RDW: 12.7 % (ref 11.5–15.5)
WBC: 3.8 K/uL — ABNORMAL LOW (ref 4.0–10.5)
nRBC: 0 % (ref 0.0–0.2)

## 2023-12-08 LAB — CANCER ANTIGEN 19-9: CA 19-9: 41 U/mL — ABNORMAL HIGH (ref 0–35)

## 2023-12-08 LAB — CEA: CEA: 2.5 ng/mL (ref 0.0–4.7)

## 2023-12-08 LAB — AFP TUMOR MARKER: AFP, Serum, Tumor Marker: <1.8 ng/mL (ref 0.0–6.4)

## 2023-12-08 MED ORDER — SODIUM CHLORIDE 0.9 % IV SOLN: INTRAVENOUS | Status: DC

## 2023-12-08 NOTE — Plan of Care (Signed)
  Problem: Education: Goal: Knowledge of General Education information will improve Description: Including pain rating scale, medication(s)/side effects and non-pharmacologic comfort measures Outcome: Adequate for Discharge   Problem: Clinical Measurements: Goal: Ability to maintain clinical measurements within normal limits will improve Outcome: Adequate for Discharge Goal: Diagnostic test results will improve Outcome: Progressing

## 2023-12-08 NOTE — Progress Notes (Signed)
 Initial Nutrition Assessment  DOCUMENTATION CODES:   Not applicable  INTERVENTION:   -Liberalize diet to regular for widest variety of meal selections -MVI with minerals daily -Ensure Plus High Protein po TID, each supplement provides 350 kcal and 20 grams of protein   NUTRITION DIAGNOSIS:   Increased nutrient needs related to cancer and cancer related treatments as evidenced by estimated needs.  GOAL:   Patient will meet greater than or equal to 90% of their needs  MONITOR:   PO intake, Supplement acceptance  REASON FOR ASSESSMENT:   Malnutrition Screening Tool    ASSESSMENT:   89 y.o. 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 Rogers) who presents due to abdominal pain.  Patient admitted with hyperkalemia.   Reviewed I/O's: +480 ml x 24 hours  Patient unavailable at time of visit. Attempted to speak with patient via call to hospital room phone, however, unable to reach. RD unable to obtain further nutrition-related history or complete nutrition-focused physical exam at this time.    Per MD notes, he was recently admitted ot [facility] hospital for abdominal pain and constipation, which was treated with laxatives and he was discharged home. He has been having loose stools and abdominal discomfort.   Case discussed in interdisciplinary rounds. He has high K levels and has been giving lokelma. MD reports he is very frail and has been getting fluids. Oncology following; suspect will not be aggressive with care secondary to age. He has arrangements with outpatient palliative care and home health services.   Case discussed with RN, who reports fair appetite. He consumed 50% of his breakfast this AM.   He is currently on a heart healthy diet. Documented meal completions 75-100%.   Weight has been stable over the  past 4 months  Medications reviewed and include magnesium  citrate, protonix , senokot, lokelma, and 0.9% sodium chloride  infusion @ 100 ml/hr.   Labs reviewed: Na: 132, K: 5.8 (on lokelma).    Diet Order:   Diet Order             Diet Heart Room service appropriate? Yes; Fluid consistency: Thin  Diet effective now                   EDUCATION NEEDS:   No education needs have been identified at this time  Skin:  Skin Assessment: Reviewed RN Assessment  Last BM:  12/07/23  Height:   Ht Readings from Last 1 Encounters:  12/06/23 6' 1 (1.854 m)    Weight:   Wt Readings from Last 1 Encounters:  12/06/23 79.2 kg    Ideal Body Weight:  83.6 kg  BMI:  Body mass index is 23.04 kg/m.  Estimated Nutritional Needs:   Kcal:  2150-2350  Protein:  105-120 grams  Fluid:  2.0-2.2 L    Margery ORN, RD, LDN, CDCES Registered Dietitian III Certified Diabetes Care and Education Specialist If unable to reach this RD, please use RD Inpatient group chat on secure chat between hours of 8am-4 pm daily

## 2023-12-08 NOTE — Progress Notes (Signed)
 PROGRESS NOTE    Patient: Derrick Long                            PCP: Estefana Doffing, MD                    DOB: Aug 07, 1926            DOA: 12/05/2023 FMW:[id number]             DOS: 12/08/2023, 11:32 AM   LOS: 3 days   Date of Service: The patient was seen and examined on 12/08/2023  Subjective:   The patient was seen and examined this morning, stable no acute distress No issues overnight Daughter present at bedside  Brief Narrative:   HAJI DELAINE is a 89 y.o. 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 Rogers) who presents emergency department due to abdominal pain.  Patient was recently admitted to the [facility] hospital in [place] due to abdominal pain and constipation treated with laxatives after which patient had a bowel movement and was discharged home.  However he has been having loose stools and some abdominal discomfort.  He denies fever, shortness of breath, vomiting.   ED Course:  BP was 99/72, other vital signs were within normal range.  Workup in the ED showed WBC of 3.0, hemoglobin 10.2, hematocrit 31.1, MCV 103.3.  BMP showed sodium 130, potassium 6.2, chloride 100, bicarb 20, blood glucose 98, BUN 36, creatinine 2.71 (baseline creatinine at 1.0-1.2).  Urinalysis was normal. CT abdomen and pelvis without contrast showed mild  interval growth of right adrenal nodule to 4.9 from 1.9 cm on 08/01/21; concerning for primary neoplasm or metastasis. Calcium  gluconate was given to stabilize the heart due to hyperkalemia, Lokelma was given and IV hydration was provided.  TRH was asked to admit patient.      Assessment & Plan:   Principal Problem:   Hyperkalemia Active Problems:   Essential hypertension   GERD (gastroesophageal reflux disease)   Abdominal pain   Acute kidney injury superimposed on stage  4 chronic kidney disease (HCC)   Hyponatremia   Macrocytic anemia   Leukopenia   Generalized weakness   Prolonged QT interval   Anemia of chronic disease   Atrial fibrillation, chronic (HCC)   Urinary retention     Hyperkalemia -resistant K+ 6.2 >>>> 5.6 >>> 5.9, 5.8 - ED was treated with calcium  gluconate - Patient has been receiving Lokelma, will continue daily 10 mg -Monitor electrolytes closely   Hyponatremia Azotemia, exacerbated by dehydration Improving with hydration, 133, 133, 132    Hypotensive  - Restarting gentle IV fluid hydration - Discontinue home medication of Norvasc  - Holding home medication of Coreg  today   leukopenia -possibly reactive With underlying pelvic mass-possibly cancer No signs of infection -Monitoring   Acute kidney injury superimposed on CKD 4 Dehydration -reinitiating gentle IV fluid hydration Creatinine 2.71 (baseline creatinine at 1.0-1.2)-->>  2.09, 1.75, 1.80 Renally adjust medications, avoid nephrotoxic agents/dehydration/hypotension   Generalized weakness Fall precaution Continue PT/OT eval and treat   Abdominal pain -POA -abdominal pain, w  Constipation, with abdominal mass -Continue bowel regimen CT abdomen increased size of abdominal mass right adrenal nodule to 4.9 from 1.9 cm     Prolonged QT interval QTc 552 ms Avoid QT prolonging drugs Magnesium  2.1 with hyperkalemia Repeat  EKG in the morning -Managing electrolytes   Macrocytic anemia MCV 103.3 Vitamin B12 < 400 started on supplement B12 1000 mcg daily  Folate levels within normal limits  Anemia of Chronic disease Hemoglobin low at 10.2, stable Check an iron level Continue ferrous sulfate    Essential hypertension -Continue amlodipine , Coreg    GERD - Continue Protonix    Chronic atrial fibrillation - Continue Coreg   Patient was not on any anticoagulant possibly due to history of GI bleed  Pelvic mass  CT abdomen and pelvis showed  interval growth of  right adrenal nodule to 4.9 from 1.9 cm on 08/01/21 Metastatic prostate cancer to the right adrenal gland - Consulted oncology, recommended checking PSA to assess the extent of the metastases - No oncology follow-up or treatment recommended Patient was following Dr. Rogers Palliative care team consulted, DNR/DNI confirmed    Urinary retention Continue Flomax  twice daily bowel regimen    ------------------------------------------------------------------------------------------------------------------------- Nutritional status:  The patient's BMI is: Body mass index is 23.04 kg/m. I agree with the assessment and plan as outlined -------------------------------------------------------------------------------------------------------------------------------------------------   ------------------------------------------------------------------------------------------------------------------------------------------------  DVT prophylaxis:  heparin  injection 5,000 Units Start: 12/06/23 0600 SCDs Start: 12/06/23 0210   Code Status:   Code Status: Limited: Do not attempt resuscitation (DNR) -DNR-LIMITED -Do Not Intubate/DNI   Family Communication: No family member present at bedside-  -Advance care planning has been discussed.   Admission status:   Status is: Inpatient Remains inpatient appropriate because: needing IVF, electrolyte normality and correction   Disposition: From  - home             Planning for discharge in 1-2 days   Procedures:   No admission procedures for hospital encounter.   Antimicrobials:  Anti-infectives (From admission, onward)    None        Medication:   carvedilol   3.125 mg Oral BID WC   ferrous sulfate   324 mg Oral QODAY   heparin   5,000 Units Subcutaneous Q8H   magnesium  citrate  1 Bottle Oral Once   pantoprazole   40 mg Oral Daily   senna  1 tablet Oral QHS   sodium zirconium cyclosilicate  10 g Oral Daily   tamsulosin   0.4 mg Oral  BID    acetaminophen  **OR** acetaminophen , oxyCODONE , prochlorperazine   Objective:   Vitals:   12/07/23 1222 12/07/23 2018 12/08/23 0755 12/08/23 0926  BP: 115/60 (!) 96/52 (!) 105/59 (!) 93/49  Pulse: 65 60 78 61  Resp:  18 18   Temp: (!) 97.4 F (36.3 C) 97.7 F (36.5 C) 98 F (36.7 C)   TempSrc: Oral Oral Oral   SpO2: 99% 100% 99%   Weight:      Height:        Intake/Output Summary (Last 24 hours) at 12/08/2023 1132 Last data filed at 12/08/2023 0900 Gross per 24 hour  Intake 240 ml  Output --  Net 240 ml   Filed Weights   12/05/23 1720 12/06/23 0344  Weight: 79.4 kg 79.2 kg     Physical examination:   General:  AAO x 3,  cooperative, no distress;   HEENT:  Normocephalic, PERRL, otherwise with in Normal limits   Neuro:  CNII-XII intact. , normal motor and sensation, reflexes intact   Lungs:   Clear to auscultation BL, Respirations unlabored,  No wheezes / crackles  Cardio:    S1/S2, RRR, No murmure, No Rubs or Gallops   Abdomen:  Soft, non-tender, bowel sounds active all four quadrants, no guarding or peritoneal signs.  Muscular  skeletal:  Limited exam -global generalized weaknesses - in bed, able to move all 4 extremities,   2+ pulses,  symmetric, No pitting edema  Skin:  Dry, warm to touch, negative for any Rashes,  Wounds: Please see nursing documentation   --------------------------------------------------------------------------------------------------------------------------    LABs:     Latest Ref Rng & Units 12/08/2023    4:55 AM 12/07/2023    8:39 AM 12/06/2023    4:38 AM  CBC  WBC 4.0 - 10.5 K/uL 3.8  2.8  3.3   Hemoglobin 13.0 - 17.0 g/dL 9.3  89.9  9.1   Hematocrit 39.0 - 52.0 % 28.2  30.4  27.6   Platelets 150 - 400 K/uL 233  277  241       Latest Ref Rng & Units 12/08/2023    4:55 AM 12/07/2023    8:39 AM 12/06/2023    4:38 AM  CMP  Glucose 70 - 99 mg/dL 81  86  85   BUN 8 - 23 mg/dL 29  29  32   Creatinine 0.61 - 1.24  mg/dL 8.19  8.24  7.90   Sodium 135 - 145 mmol/L 132  133  133   Potassium 3.5 - 5.1 mmol/L 5.8  5.9  5.6   Chloride 98 - 111 mmol/L 102  102  104   CO2 22 - 32 mmol/L 21  22  21    Calcium  8.9 - 10.3 mg/dL 9.2  9.7  9.2   Total Protein 6.5 - 8.1 g/dL 6.7  7.4  6.7   Total Bilirubin 0.0 - 1.2 mg/dL 0.5  0.6  0.4   Alkaline Phos 38 - 126 U/L 52  59  56   AST 15 - 41 U/L 17  19  18    ALT 0 - 44 U/L 6  7  7         Micro Results No results found for this or any previous visit (from the past 240 hours).  Radiology Reports No results found.   SIGNED: Adriana DELENA Grams, MD, FHM. FAAFP. Jolynn Pack - [facility] Time spent - 55 min.  In seeing, evaluating and examining the patient. Reviewing medical records, labs, drawn plan of care. [facility],  Pager (please use [email or web address] to page/ text) Please use Epic Secure Chat for non-urgent communication (7AM-7PM)  If 7PM-7AM, please contact night-coverage [email or web address], 12/08/2023, 11:32 AM

## 2023-12-08 NOTE — Progress Notes (Signed)
 Daily Progress Note   Date: 12/08/2023   Patient Name: Derrick Long  DOB: 07-12-1926  MRN: [id number]  Age / Sex: 89 y.o., male  Attending Physician: Willette Adriana LABOR, MD Primary Care Physician: Estefana Doffing, MD Admit Date: 12/05/2023 Length of Stay: 3 days  Reason for Follow-up: Establishing goals of care  Past Medical History:  Diagnosis Date   Acid reflux    Acute colitis 09/16/2019   AKI (acute kidney injury) 07/31/2012   Anemia    BENIGN PROSTATIC HYPERTROPHY, WITH OBSTRUCTION 12/18/2005   Qualifier: Diagnosis of  By: Olene Dodrill     Chest pain 07/31/2012   Colitis 09/16/2019   Colon cancer (HCC)    per patient, colon cancer more than 10 years ago s/p right hemicolectomy   Elevated bilirubin    Elevated LFTs    Ex-cigarette smoker    Gout    Hyperlipemia    Hypertension    Hyponatremia 08/01/2021   Intractable abdominal pain 02/03/2021   LATERAL MENISCUS TEAR, RIGHT 04/19/2006   Qualifier: Diagnosis of  By: Karren MD, Cornelius     Leukocytosis, unspecified 07/31/2012   Paroxysmal atrial fibrillation (HCC) 2014   Presence of permanent cardiac pacemaker    Prostate cancer (HCC) 08/08/2013   SEEDS 20 YEARS AGO   Prostate disease    RENAL CALCULUS 12/18/2005   Qualifier: Diagnosis of  By: Olene Dodrill     RUQ pain    UTI (urinary tract infection) 03/26/2021    Subjective:   Subjective: Chart Reviewed. Updates received. Patient Assessed. Created space and opportunity for patient  and family to explore thoughts and feelings regarding current medical situation.  Today's Discussion: Today before meeting with the patient/family, I reviewed the chart notes including nursing note from yesterday, dietitian note from today.  Later in the day I also reviewed hospitalist note from today. I also reviewed vital signs, nursing flowsheets, medication administrations record, labs, and imaging. Labs reviewed include CBC which shows persistent suppressed white  count though improved from 2.8 yesterday to 3.8 today in the setting of no obvious infection but apparently progressive cancer; mild drop in hemoglobin at 9.3 today from 10.0 yesterday but 9.1 today before that, overall stable in the setting of chronic kidney disease and chronic anemia as far as records in the system go through November 2020.  CMP shows stable hyponatremia at 132 today from 133 yesterday, persistent hyperkalemia in the setting of acute hyperkalemia at 5.8 today from 5.9 yesterday and a peak of 6.2 three days ago status post treatment during hospitalization.  AFP tumor marker yesterday normal at <1.8, CEA resulted yesterday at normal 2.5, and cancer antigen 19-9 resulted yesterday elevated at 41.   Oncology was consulted and saw the patient yesterday.  They discussed with him that his adrenal mass has progressed.  However, because of his age and comorbidities he is not a candidate for further systemic chemotherapy.  He can continue androgen deprivation therapy (ADT with Eligard ) but recommended prioritizing quality of life.  Recommended some labs to be done including PSA, no further oncology follow-up.  Recommended palliative care involvement for goals of care discussions.  Today I saw the patient at bedside, his daughter was present.  The patient was sitting in the bedside chair.  He denies pain, nausea, vomiting.  We talked about the oncology visit yesterday.  He states that they talked about the cancer, the cancer is not curable, and recommended continuing medications including symptom medicines.  His daughter added that oncology  told him that it is a slow-moving cancer and they can use some medicines to help keep it day.  It is not a type of cancer that tends to spread everywhere but could get into his bones.  Treatment options are slim, would likely need medications for pain.  We also celebrated his exceptional life and the wonderful 95 years he has had.  Overall they received the message,  or understood, that he is not hospice appropriate yet because of the slow-growing cancer.  They are not interested in hospice right now.  We discussed options including outpatient palliative care referral.  There is an option for outpatient palliative care in the cancer center.  Daughter shares that she is planning to call for follow-up appointment with oncology, though she understands it is not urgent and not going to do it right now.  I shared that outpatient palliative care in the cancer center can see her as well to help manage symptom needs and continue goals of care conversations.  However, the cancer center palliative care provider is currently on maternity leave.  In order to ensure that the patient has appropriate outpatient palliative care follow-up we will confirm with [facility] that a referral has been sent for palliative care to [facility] as discussed yesterday.  I shared that at this point goals are clear, anticipating discharge in the next 24 to 48 hours.  Palliative medicine will back off but remains available while admitted for any new needs or significant changes.  I provided emotional and general support through therapeutic listening, empathy, sharing of stories, and other techniques. I answered all questions and addressed all concerns to the best of my ability.  Review of Systems  Constitutional:  Positive for fatigue.       Denies pain in general  Respiratory:  Negative for cough and shortness of breath.   Cardiovascular:  Negative for chest pain.  Gastrointestinal:  Negative for abdominal pain, nausea and vomiting.  Neurological:  Positive for weakness.    Objective:   Primary Diagnoses: Present on Admission:  Hyperkalemia  Hyponatremia  Acute kidney injury superimposed on stage 4 chronic kidney disease (HCC)  Abdominal pain  Macrocytic anemia  Essential hypertension  GERD (gastroesophageal reflux disease)   Vital Signs:  BP (!) 93/49   Pulse 61   Temp 98 F (36.7  C) (Oral)   Resp 18   Ht 6' 1 (1.854 m)   Wt 79.2 kg   SpO2 99%   BMI 23.04 kg/m   Physical Exam Vitals and nursing note reviewed.  Constitutional:      General: He is not in acute distress.    Appearance: He is ill-appearing.  HENT:     Head: Normocephalic and atraumatic.  Cardiovascular:     Rate and Rhythm: Normal rate.  Pulmonary:     Effort: Pulmonary effort is normal. No respiratory distress.  Abdominal:     General: Abdomen is flat.  Skin:    General: Skin is warm and dry.  Neurological:     General: No focal deficit present.     Mental Status: He is alert and oriented to person, place, and time.  Psychiatric:        Mood and Affect: Mood normal.        Behavior: Behavior normal.     Palliative Assessment/Data: 60%   Existing Vynca/ACP Documentation: None  Assessment & Plan:   HPI/Patient Profile:  89 y.o. male  with past medical history of hypertension, hyperlipidemia, bradycardia status  post pacemaker, paroxysmal A-fib, GERD, CKD 3, prostate cancer with metastasis to right adrenal gland (follows with Dr. Rogers) who presents emergency department due to abdominal pain. He was admitted on 12/05/2023 with hyperkalemia, hyponatremia, leukopenia, AKI on CKD, generalized weakness, abdominal pain, constipation, pelvic mass/metastatic prostate cancer, and others.    Palliative medicine was consulted for GOC conversations.  SUMMARY OF RECOMMENDATIONS   DNR-Limited Continue to treat the treatable Outpatient palliative care referral already in place, they will schedule home visit at discharge Palliative medicine will back off Please notify us  of any significant clinical change or new palliative needs in the interim  Symptom Management:  Per primary team Palliative medicine is available to assist as needed  Code Status: DNR - Limited (DNR/DNI)  Prognosis: Unable to determine  Discharge Planning: Home with outpatient palliative care  Discussed with:  Patient, medical team, nursing team, [facility] team  Thank you for allowing us  to participate in the care of XZAVIER SWINGER PMT will continue to support holistically.  Billing based on MDM: Moderate  Detailed review of medical records (labs, imaging, vital signs), medically appropriate exam, discussed with treatment team, counseling and education to patient, family, & staff, documenting clinical information, medication management, coordination of care  Camellia Kays, NP Palliative Medicine Team  Team Phone # [phone] (Nights/Weekends)  10/15/2020, 8:17 AM

## 2023-12-09 DIAGNOSIS — E875 Hyperkalemia: Secondary | ICD-10-CM | POA: Diagnosis not present

## 2023-12-09 LAB — COMPREHENSIVE METABOLIC PANEL WITH GFR
ALT: 6 U/L (ref 0–44)
AST: 18 U/L (ref 15–41)
Albumin: 3.5 g/dL (ref 3.5–5.0)
Alkaline Phosphatase: 51 U/L (ref 38–126)
Anion gap: 9 (ref 5–15)
BUN: 30 mg/dL — ABNORMAL HIGH (ref 8–23)
CO2: 21 mmol/L — ABNORMAL LOW (ref 22–32)
Calcium: 9 mg/dL (ref 8.9–10.3)
Chloride: 104 mmol/L (ref 98–111)
Creatinine, Ser: 1.81 mg/dL — ABNORMAL HIGH (ref 0.61–1.24)
GFR, Estimated: 34 mL/min — ABNORMAL LOW (ref >60)
Glucose, Bld: 83 mg/dL (ref 70–99)
Potassium: 5.3 mmol/L — ABNORMAL HIGH (ref 3.5–5.1)
Sodium: 134 mmol/L — ABNORMAL LOW (ref 135–145)
Total Bilirubin: 0.4 mg/dL (ref 0.0–1.2)
Total Protein: 6.5 g/dL (ref 6.5–8.1)

## 2023-12-09 LAB — CBC WITH DIFFERENTIAL/PLATELET
Abs Immature Granulocytes: 0.01 K/uL (ref 0.00–0.07)
Basophils Absolute: 0 K/uL (ref 0.0–0.1)
Basophils Relative: 1 %
Eosinophils Absolute: 0.2 K/uL (ref 0.0–0.5)
Eosinophils Relative: 7 %
HCT: 27 % — ABNORMAL LOW (ref 39.0–52.0)
Hemoglobin: 9 g/dL — ABNORMAL LOW (ref 13.0–17.0)
Immature Granulocytes: 0 %
Lymphocytes Relative: 55 %
Lymphs Abs: 1.8 K/uL (ref 0.7–4.0)
MCH: 34.1 pg — ABNORMAL HIGH (ref 26.0–34.0)
MCHC: 33.3 g/dL (ref 30.0–36.0)
MCV: 102.3 fL — ABNORMAL HIGH (ref 80.0–100.0)
Monocytes Absolute: 0.4 K/uL (ref 0.1–1.0)
Monocytes Relative: 13 %
Neutro Abs: 0.8 K/uL — ABNORMAL LOW (ref 1.7–7.7)
Neutrophils Relative %: 24 %
Platelets: 216 K/uL (ref 150–400)
RBC: 2.64 MIL/uL — ABNORMAL LOW (ref 4.22–5.81)
RDW: 12.8 % (ref 11.5–15.5)
WBC: 3.3 K/uL — ABNORMAL LOW (ref 4.0–10.5)
nRBC: 0 % (ref 0.0–0.2)

## 2023-12-09 MED ORDER — SODIUM CHLORIDE 0.9 % IV SOLN: INTRAVENOUS | Status: AC

## 2023-12-09 NOTE — Progress Notes (Signed)
 PROGRESS NOTE    Patient: Derrick Long                            PCP: Estefana Doffing, MD                    DOB: Dec 31, 1926            DOA: 12/05/2023 FMW:[id number]             DOS: 12/09/2023, 9:29 AM   LOS: 4 days   Date of Service: The patient was seen and examined on 12/09/2023  Subjective:   The patient was seen and examined this morning, stable no acute distress No issues overnight Daughter present at bedside  Brief Narrative:   Derrick Long is a 89 y.o. 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 Rogers) who presents emergency department due to abdominal pain.  Patient was recently admitted to the [facility] hospital in [place] due to abdominal pain and constipation treated with laxatives after which patient had a bowel movement and was discharged home.  However he has been having loose stools and some abdominal discomfort.  He denies fever, shortness of breath, vomiting.   ED Course:  BP was 99/72, other vital signs were within normal range.  Workup in the ED showed WBC of 3.0, hemoglobin 10.2, hematocrit 31.1, MCV 103.3.  BMP showed sodium 130, potassium 6.2, chloride 100, bicarb 20, blood glucose 98, BUN 36, creatinine 2.71 (baseline creatinine at 1.0-1.2).  Urinalysis was normal. CT abdomen and pelvis without contrast showed mild  interval growth of right adrenal nodule to 4.9 from 1.9 cm on 08/01/21; concerning for primary neoplasm or metastasis. Calcium  gluconate was given to stabilize the heart due to hyperkalemia, Lokelma was given and IV hydration was provided.  TRH was asked to admit patient.      Assessment & Plan:   Principal Problem:   Hyperkalemia Active Problems:   Essential hypertension   GERD (gastroesophageal reflux disease)   Abdominal pain   Acute kidney injury superimposed on stage 4  chronic kidney disease (HCC)   Hyponatremia   Macrocytic anemia   Leukopenia   Generalized weakness   Prolonged QT interval   Anemia of chronic disease   Atrial fibrillation, chronic (HCC)   Urinary retention     Hyperkalemia -resistant K+ 6.2 >>>> 5.6 >>> 5.9, 5.8, 5.3 - ED was treated with calcium  gluconate - Patient has been receiving Lokelma, will continue daily 10 mg -Monitor electrolytes closely   Hyponatremia Azotemia, exacerbated by dehydration Improving with hydration, 133, 133, 132, 134    Hypotensive  - Continue gentle IV fluid hydration - Discontinue home medication of Norvasc  - Resume Coreg    leukopenia  With underlying pelvic mass-possibly cancer No signs of infection -Monitoring   Acute kidney injury superimposed on CKD 4 Dehydration -reinitiating gentle IV fluid hydration Creatinine 2.71 (baseline creatinine at 1.0-1.2)-->>  2.09, 1.75, 1.80, 1.80 Renally adjust medications, avoid nephrotoxic agents/dehydration/hypotension   Generalized weakness Fall precaution Continue PT/OT eval and treat   Abdominal pain -POA -abdominal pain, w  Constipation, with abdominal mass -Continue bowel regimen CT abdomen increased size of abdominal mass right adrenal nodule to 4.9 from 1.9 cm     Prolonged QT interval QTc 552 ms Avoid QT prolonging drugs Magnesium  2.1 with hyperkalemia Repeat EKG in  the morning -Managing electrolytes   Macrocytic anemia MCV 103.3 Vitamin B12 < 400 started on supplement B12 1000 mcg daily  Folate levels within normal limits  Anemia of Chronic disease Hemoglobin low at 10.2, stable Check an iron level Continue ferrous sulfate    Essential hypertension -Continue amlodipine , Coreg    GERD - Continue Protonix    Chronic atrial fibrillation - Continue Coreg   Patient was not on any anticoagulant possibly due to history of GI bleed  Pelvic mass  CT abdomen and pelvis showed  interval growth of right adrenal nodule to 4.9  from 1.9 cm on 08/01/21 Metastatic prostate cancer to the right adrenal gland - Consulted oncology, recommended checking PSA to assess the extent of the metastases - No oncology follow-up or treatment recommended Patient was following Dr. Rogers Palliative care team consulted, DNR/DNI confirmed    Urinary retention Continue Flomax  twice daily bowel regimen    ------------------------------------------------------------------------------------------------------------------------- Nutritional status:  The patient's BMI is: Body mass index is 23.04 kg/m. I agree with the assessment and plan as outlined -------------------------------------------------------------------------------------------------------------------------------------------------   ------------------------------------------------------------------------------------------------------------------------------------------------  DVT prophylaxis:  heparin  injection 5,000 Units Start: 12/06/23 0600 SCDs Start: 12/06/23 0210   Code Status:   Code Status: Limited: Do not attempt resuscitation (DNR) -DNR-LIMITED -Do Not Intubate/DNI   Family Communication: Daughter present at bedside, updated 10/23 -Advance care planning has been discussed.   Admission status:   Status is: Inpatient Remains inpatient appropriate because: needing IVF, electrolyte normality and correction   Disposition: From  - home             Planning for discharge in 1-2 days   Procedures:   No admission procedures for hospital encounter.   Antimicrobials:  Anti-infectives (From admission, onward)    None        Medication:   carvedilol   3.125 mg Oral BID WC   ferrous sulfate   324 mg Oral QODAY   heparin   5,000 Units Subcutaneous Q8H   magnesium  citrate  1 Bottle Oral Once   pantoprazole   40 mg Oral Daily   senna  1 tablet Oral QHS   sodium zirconium cyclosilicate  10 g Oral Daily   tamsulosin   0.4 mg Oral BID    acetaminophen   **OR** acetaminophen , oxyCODONE , prochlorperazine   Objective:   Vitals:   12/08/23 1559 12/08/23 2112 12/09/23 0424 12/09/23 0725  BP: (!) 106/59 131/70 117/67 98/60  Pulse: 61 76 67 79  Resp:  17 18 18   Temp:  98.1 F (36.7 C) 98.4 F (36.9 C) 98.5 F (36.9 C)  TempSrc:  Oral Oral Oral  SpO2:  99% 99% 99%  Weight:      Height:        Intake/Output Summary (Last 24 hours) at 12/09/2023 0929 Last data filed at 12/08/2023 1843 Gross per 24 hour  Intake 1234.97 ml  Output --  Net 1234.97 ml   Filed Weights   12/05/23 1720 12/06/23 0344  Weight: 79.4 kg 79.2 kg     Physical examination:   General:  AAO x 3,  cooperative, no distress;   HEENT:  Normocephalic, PERRL, otherwise with in Normal limits   Neuro:  CNII-XII intact. , normal motor and sensation, reflexes intact   Lungs:   Clear to auscultation BL, Respirations unlabored,  No wheezes / crackles  Cardio:    S1/S2, RRR, No murmure, No Rubs or Gallops   Abdomen:  Soft, non-tender, bowel sounds active all four quadrants, no guarding or peritoneal signs.  Muscular  skeletal:  Limited  exam -global generalized weaknesses - in bed, able to move all 4 extremities,   2+ pulses,  symmetric, No pitting edema  Skin:  Dry, warm to touch, negative for any Rashes,  Wounds: Please see nursing documentation    --------------------------------------------------------------------------------------------------------------------------    LABs:     Latest Ref Rng & Units 12/09/2023    3:50 AM 12/08/2023    4:55 AM 12/07/2023    8:39 AM  CBC  WBC 4.0 - 10.5 K/uL 3.3  3.8  2.8   Hemoglobin 13.0 - 17.0 g/dL 9.0  9.3  89.9   Hematocrit 39.0 - 52.0 % 27.0  28.2  30.4   Platelets 150 - 400 K/uL 216  233  277       Latest Ref Rng & Units 12/09/2023    3:50 AM 12/08/2023    4:55 AM 12/07/2023    8:39 AM  CMP  Glucose 70 - 99 mg/dL 83  81  86   BUN 8 - 23 mg/dL 30  29  29    Creatinine 0.61 - 1.24 mg/dL 8.18  8.19  8.24    Sodium 135 - 145 mmol/L 134  132  133   Potassium 3.5 - 5.1 mmol/L 5.3  5.8  5.9   Chloride 98 - 111 mmol/L 104  102  102   CO2 22 - 32 mmol/L 21  21  22    Calcium  8.9 - 10.3 mg/dL 9.0  9.2  9.7   Total Protein 6.5 - 8.1 g/dL 6.5  6.7  7.4   Total Bilirubin 0.0 - 1.2 mg/dL 0.4  0.5  0.6   Alkaline Phos 38 - 126 U/L 51  52  59   AST 15 - 41 U/L 18  17  19    ALT 0 - 44 U/L 6  6  7         Micro Results No results found for this or any previous visit (from the past 240 hours).  Radiology Reports No results found.   SIGNED: Adriana DELENA Grams, MD, FHM. FAAFP. Jolynn Pack - [facility] Time spent - 55 min.  In seeing, evaluating and examining the patient. Reviewing medical records, labs, drawn plan of care. [facility],  Pager (please use [email or web address] to page/ text) Please use Epic Secure Chat for non-urgent communication (7AM-7PM)  If 7PM-7AM, please contact night-coverage [email or web address], 12/09/2023, 9:29 AM

## 2023-12-09 NOTE — Progress Notes (Signed)
 Physical Therapy Treatment Patient Details Name: Derrick Long MRN: [id number] DOB: March 11, 1926 Today's Date: 12/09/2023   History of Present Illness Derrick Long is a 89 y.o. 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 Rogers) who presents emergency department due to abdominal pain.  Patient was recently admitted to the [facility] hospital in [place] due to abdominal pain and constipation treated with laxatives after which patient had a bowel movement and was discharged home.  However he has been having loose stools and some abdominal discomfort.  He denies fever, shortness of breath, vomiting.    PT Comments  Patient presents seated in chair and agreeable for therapy. Patient demonstrates fair/good return for completing BLE ROM/strengthening exercises while seated in chair, tolerated ambulating in room, hallway without loss of balance, but limited mostly due to fatigue. Patient tolerated staying up in chair after therapy. Patient will benefit from continued skilled physical therapy in hospital and recommended venue below to increase strength, balance, endurance for safe ADLs and gait.       If plan is discharge home, recommend the following: A little help with walking and/or transfers;Assistance with cooking/housework;Help with stairs or ramp for entrance;A little help with bathing/dressing/bathroom;Assist for transportation   Can travel by private vehicle        Equipment Recommendations  None recommended by PT    Recommendations for Other Services       Precautions / Restrictions Precautions Precautions: Fall Recall of Precautions/Restrictions: Intact Restrictions Weight Bearing Restrictions Per Provider Order: No     Mobility  Bed Mobility               General bed mobility comments: Patient presents  seated in chair (assisted by nursing staff)    Transfers Overall transfer level: Needs assistance Equipment used: Rolling walker (2 wheels) Transfers: Sit to/from Stand, Bed to chair/wheelchair/BSC Sit to Stand: Supervision, Contact guard assist   Step pivot transfers: Supervision, Contact guard assist       General transfer comment: fair/good return for completing sit to stands and transferring back to chair    Ambulation/Gait Ambulation/Gait assistance: Supervision, Contact guard assist Gait Distance (Feet): 75 Feet Assistive device: Rolling walker (2 wheels) Gait Pattern/deviations: Decreased step length - right, Decreased step length - left, Decreased stride length, Trunk flexed Gait velocity: decreased     General Gait Details: slightly labored movement with good return for ambulating in room/hallway using RW without loss of balance, but limited mostly due to c/o fatigue   Stairs             Wheelchair Mobility     Tilt Bed    Modified Rankin (Stroke Patients Only)       Balance Overall balance assessment: Needs assistance Sitting-balance support: Feet supported, No upper extremity supported Sitting balance-Leahy Scale: Good Sitting balance - Comments: seated at EOB   Standing balance support: Reliant on assistive device for balance, During functional activity, Bilateral upper extremity supported Standing balance-Leahy Scale: Fair Standing balance comment: fair/good using RW                            Communication Communication Communication: No apparent difficulties  Cognition Arousal: Alert Behavior During Therapy: WFL for tasks assessed/performed   PT - Cognitive impairments: No apparent impairments  Following commands: Intact      Cueing Cueing Techniques: Verbal cues, Tactile cues  Exercises General Exercises - Lower Extremity Long Arc Quad: Seated, AROM, Strengthening, Both, 10 reps Hip  Flexion/Marching: Seated, AROM, Strengthening, Both, 10 reps Toe Raises: Seated, AROM, Strengthening, Both, 10 reps Heel Raises: Seated, AROM, Strengthening, Both, 10 reps    General Comments        Pertinent Vitals/Pain Pain Assessment Pain Assessment: No/denies pain    Home Living                          Prior Function            PT Goals (current goals can now be found in the care plan section) Acute Rehab PT Goals Patient Stated Goal: Pt. wants to return home to family and to church PT Goal Formulation: With patient Time For Goal Achievement: 12/06/23 Potential to Achieve Goals: Good Progress towards PT goals: Progressing toward goals    Frequency    Min 3X/week      PT Plan      Co-evaluation              AM-PAC PT 6 Clicks Mobility   Outcome Measure  Help needed turning from your back to your side while in a flat bed without using bedrails?: None Help needed moving from lying on your back to sitting on the side of a flat bed without using bedrails?: A Little Help needed moving to and from a bed to a chair (including a wheelchair)?: A Little Help needed standing up from a chair using your arms (e.g., wheelchair or bedside chair)?: A Little Help needed to walk in hospital room?: A Little Help needed climbing 3-5 steps with a railing? : A Little 6 Click Score: 19    End of Session   Activity Tolerance: Patient tolerated treatment well;Patient limited by fatigue Patient left: in chair;with call bell/phone within reach Nurse Communication: Mobility status PT Visit Diagnosis: Unsteadiness on feet (R26.81);Repeated falls (R29.6);Muscle weakness (generalized) (M62.81)     Time: 8874-8861 PT Time Calculation (min) (ACUTE ONLY): 13 min  Charges:    $Gait Training: 8-22 mins PT General Charges $$ ACUTE PT VISIT: 1 Visit                     2:34 PM, 12/09/23 Lynwood Music, MPT Physical Therapist with [facility] [phone] office [phone] mobile phone

## 2023-12-09 NOTE — Plan of Care (Signed)
  Problem: Education: Goal: Knowledge of General Education information will improve Description: Including pain rating scale, medication(s)/side effects and non-pharmacologic comfort measures Outcome: Progressing   Problem: Health Behavior/Discharge Planning: Goal: Ability to manage health-related needs will improve Outcome: Progressing   Problem: Clinical Measurements: Goal: Ability to maintain clinical measurements within normal limits will improve Outcome: Progressing Goal: Will remain free from infection Outcome: Progressing Goal: Diagnostic test results will improve Outcome: Progressing Goal: Respiratory complications will improve Outcome: Progressing Goal: Cardiovascular complication will be avoided Outcome: Progressing   Problem: Activity: Goal: Risk for activity intolerance will decrease Outcome: Progressing   Problem: Nutrition: Goal: Adequate nutrition will be maintained Outcome: Progressing   Problem: Coping: Goal: Level of anxiety will decrease Outcome: Progressing   Problem: Elimination: Goal: Will not experience complications related to bowel motility Outcome: Progressing   Problem: Pain Managment: Goal: General experience of comfort will improve and/or be controlled Outcome: Progressing   Problem: Skin Integrity: Goal: Risk for impaired skin integrity will decrease Outcome: Progressing

## 2023-12-10 DIAGNOSIS — E875 Hyperkalemia: Secondary | ICD-10-CM | POA: Diagnosis not present

## 2023-12-10 LAB — BASIC METABOLIC PANEL WITH GFR
Anion gap: 9 (ref 5–15)
BUN: 27 mg/dL — ABNORMAL HIGH (ref 8–23)
CO2: 21 mmol/L — ABNORMAL LOW (ref 22–32)
Calcium: 9.4 mg/dL (ref 8.9–10.3)
Chloride: 104 mmol/L (ref 98–111)
Creatinine, Ser: 1.63 mg/dL — ABNORMAL HIGH (ref 0.61–1.24)
GFR, Estimated: 39 mL/min — ABNORMAL LOW (ref >60)
Glucose, Bld: 95 mg/dL (ref 70–99)
Potassium: 4.9 mmol/L (ref 3.5–5.1)
Sodium: 134 mmol/L — ABNORMAL LOW (ref 135–145)

## 2023-12-10 LAB — GLUCOSE, CAPILLARY: Glucose-Capillary: 97 mg/dL (ref 70–99)

## 2023-12-10 MED ORDER — SENNA 8.6 MG PO TABS: 1.0000 | ORAL_TABLET | Freq: Every day | ORAL | 0 refills | Status: AC

## 2023-12-10 MED ORDER — SODIUM ZIRCONIUM CYCLOSILICATE 10 G PO PACK: 10.0000 g | PACK | Freq: Every day | ORAL | 1 refills | Status: DC

## 2023-12-10 MED ORDER — OXYCODONE HCL 5 MG PO TABS: 5.0000 mg | ORAL_TABLET | ORAL | 0 refills | Status: AC | PRN

## 2023-12-10 NOTE — Progress Notes (Signed)
 Discharged patient took IV out was picked up at the main entrance NT wheeled patient down and was picked up by daughter Alfonso.

## 2023-12-10 NOTE — Plan of Care (Signed)
  Problem: Education: Goal: Knowledge of General Education information will improve Description: Including pain rating scale, medication(s)/side effects and non-pharmacologic comfort measures Outcome: Progressing   Problem: Health Behavior/Discharge Planning: Goal: Ability to manage health-related needs will improve Outcome: Progressing   Problem: Clinical Measurements: Goal: Ability to maintain clinical measurements within normal limits will improve Outcome: Progressing Goal: Will remain free from infection Outcome: Progressing Goal: Diagnostic test results will improve Outcome: Progressing Goal: Respiratory complications will improve Outcome: Progressing Goal: Cardiovascular complication will be avoided Outcome: Progressing   Problem: Activity: Goal: Risk for activity intolerance will decrease Outcome: Progressing   Problem: Nutrition: Goal: Adequate nutrition will be maintained Outcome: Progressing   Problem: Coping: Goal: Level of anxiety will decrease Outcome: Progressing   Problem: Elimination: Goal: Will not experience complications related to bowel motility Outcome: Progressing   Problem: Pain Managment: Goal: General experience of comfort will improve and/or be controlled Outcome: Progressing   Problem: Skin Integrity: Goal: Risk for impaired skin integrity will decrease Outcome: Progressing

## 2023-12-10 NOTE — Discharge Summary (Signed)
 Physician Discharge Summary   Patient: Derrick Long MRN: [id number] DOB: 1927/01/16  Admit date:     12/05/2023  Discharge date: 12/10/23  Discharge Physician: Adriana DELENA Grams   PCP: Estefana Doffing, MD   Recommendations at discharge:   Follow-up with PCP in 1 week Follow-up with oncology in 3-5 days BMP-to monitor potassium and creatinine (kidney function) in 3 days -possibly Monday, 12/12/2023 Follow-up with palliative care team as outpatient DO NOT TAKE the Lokelma (potassium lowering medication) till serum potassium is checked on Monday   Discharge Diagnoses: Principal Problem:   Hyperkalemia Active Problems:   Essential hypertension   GERD (gastroesophageal reflux disease)   Abdominal pain   Acute kidney injury superimposed on stage 4 chronic kidney disease (HCC)   Hyponatremia   Macrocytic anemia   Leukopenia   Generalized weakness   Prolonged QT interval   Anemia of chronic disease   Atrial fibrillation, chronic (HCC)   Urinary retention  Resolved Problems:   * No resolved hospital problems. *  Hospital Course: Derrick Long is a 89 y.o. 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 Rogers) who presents emergency department due to abdominal pain.  Patient was recently admitted to the [facility] hospital in [place] due to abdominal pain and constipation treated with laxatives after which patient had a bowel movement and was discharged home.  However he has been having loose stools and some abdominal discomfort.  He denies fever, shortness of breath, vomiting.   ED Course:  BP was 99/72, other vital signs were within normal range.  Workup in the ED showed WBC of 3.0, hemoglobin 10.2, hematocrit 31.1, MCV 103.3.  BMP showed sodium 130, potassium 6.2, chloride 100, bicarb 20, blood glucose 98, BUN 36,  creatinine 2.71 (baseline creatinine at 1.0-1.2).  Urinalysis was normal. CT abdomen and pelvis without contrast showed mild  interval growth of right adrenal nodule to 4.9 from 1.9 cm on 08/01/21; concerning for primary neoplasm or metastasis. Calcium  gluconate was given to stabilize the heart due to hyperkalemia, Lokelma was given and IV hydration was provided.  [facility] was asked to admit patient.      Hyperkalemia -resistant K+ 6.2 > 5.6 >> 5.9, 5.8, 5.3 >>>  4.9 - ED was treated with calcium  gluconate - Patient was given prescription with instructions to hold Lokelma till serum potassium level was rechecked on Monday, 12/12/2023 Patient is to follow-up with oncologist and PCP Repeating BMP on Monday, 12/12/2023    Hyponatremia Azotemia, exacerbated by dehydration Improving with hydration, 133, 133, 132, 134    Hypotensive  - Continue gentle IV fluid hydration - Discontinue home medication of Norvasc ,  - Resume Coreg    leukopenia  With underlying pelvic mass-possibly cancer No signs of infection    Acute kidney injury superimposed on CKD 4 Dehydration -reinitiating gentle IV fluid hydration Creatinine 2.71 (baseline creatinine at 1.0-1.2)-->>  2.09, 1.75, 1.80, 1.80, 1.63  Renally adjust medications, avoid nephrotoxic agents/dehydration/hypotension   Generalized weakness Fall precaution Continue PT/OT eval and treat-follow-up with home health   Abdominal pain -POA -abdominal pain, w  Constipation, with abdominal mass -Continue bowel regimen CT abdomen increased size of abdominal mass right adrenal nodule to 4.9 cm  from 1.9 cm     Prolonged QT interval QTc 552 ms -Managing electrolytes   Macrocytic anemia MCV 103.3 Vitamin B12 < 400 started on supplement B12 1000 mcg  daily  Folate levels within normal limits  Anemia of Chronic disease Hemoglobin low at 10.2, stable at 9.0  Check an iron level Continue ferrous sulfate    Essential hypertension -Continue  amlodipine , Coreg    GERD - Continue Protonix    Chronic atrial fibrillation - Continue Coreg   Patient was not on any anticoagulant possibly due to history of GI bleed, anemia  Pelvic mass  CT abdomen and pelvis showed  interval growth of right adrenal nodule to 4.9 from 1.9 cm on 08/01/21 Metastatic prostate cancer to the right adrenal gland - Consulted oncology, recommended checking PSA to assess the extent of the metastases - No oncology follow-up or treatment recommended -Goals of care was discussed with on-call oncologist Dr. Davonna  Palliative care team consulted, DNR/DNI confirmed    Urinary retention Continue Flomax  twice daily bowel regimen  Assessment and Plan: No notes have been filed under this hospital service. Service: Hospitalist        Consultants: Oncologist/palliative care team Procedures performed: None Disposition: Home Diet recommendation:  Discharge Diet Orders (From admission, onward)     Start     Ordered   12/10/23 0000  Diet - low sodium heart healthy        12/10/23 1143           Regular diet DISCHARGE MEDICATION: Allergies as of 12/10/2023   No Known Allergies      Medication List     STOP taking these medications    amLODipine  10 MG tablet Commonly known as: NORVASC    lisinopril 10 MG tablet Commonly known as: ZESTRIL   traMADol  50 MG tablet Commonly known as: ULTRAM        TAKE these medications    acetaminophen  650 MG CR tablet Commonly known as: TYLENOL  Take 1,300 mg by mouth every 8 (eight) hours as needed for pain.   carvedilol  3.125 MG tablet Commonly known as: COREG  Take 3.125 mg by mouth 2 (two) times daily with a meal.   diclofenac  sodium 1 % Gel Commonly known as: VOLTAREN  Apply 2 g topically 4 (four) times daily. What changed:  when to take this reasons to take this   Ensure Take 237 mLs by mouth daily as needed (nutrition). Vanilla or chocolate   ferrous sulfate  324 MG Tbec Take 324 mg by  mouth. Takes one tablet every other day   furosemide  20 MG tablet Commonly known as: LASIX  Take 10 mg by mouth daily. One half tablet daily   oxyCODONE  5 MG immediate release tablet Commonly known as: Oxy IR/ROXICODONE  Take 1 tablet (5 mg total) by mouth every 4 (four) hours as needed for up to 7 days for moderate pain (pain score 4-6) or severe pain (pain score 7-10).   pantoprazole  40 MG tablet Commonly known as: PROTONIX  TAKE 1 TABLET BY MOUTH ONCE DAILY.   senna 8.6 MG Tabs tablet Commonly known as: SENOKOT Take 1 tablet (8.6 mg total) by mouth at bedtime.   tamsulosin  0.4 MG Caps capsule Commonly known as: FLOMAX  Take 1 capsule (0.4 mg total) by mouth 2 (two) times daily.        Follow-up Information     [facility]  Follow up.   Why: [facility] will follow up with you at discharge to provide home health services Contact information: [address] [place] [address] [phone]                Discharge Exam: Filed Weights   12/05/23 1720 12/06/23 0344  Weight: 79.4 kg 79.2 kg   General:  AAO x 3,  cooperative, no distress;   HEENT:  Normocephalic, PERRL, otherwise with in Normal limits   Neuro:  CNII-XII intact. , normal motor and sensation, reflexes intact   Lungs:   Clear to auscultation BL, Respirations unlabored,  No wheezes / crackles  Cardio:    S1/S2, RRR, No murmure, No Rubs or Gallops   Abdomen:  Soft, non-tender, bowel sounds active all four quadrants, no guarding or peritoneal signs.  Muscular  skeletal:  Limited exam -global generalized weaknesses - in bed, able to move all 4 extremities,   2+ pulses,  symmetric, No pitting edema  Skin:  Dry, warm to touch, negative for any Rashes,  Wounds: Please see nursing documentation    Condition at discharge: fair  The results of significant diagnostics from this hospitalization (including imaging, microbiology, ancillary and laboratory) are listed below for  reference.   Imaging Studies: CT ABDOMEN PELVIS WO CONTRAST Result Date: 12/05/2023 EXAM: CT ABDOMEN AND PELVIS WITHOUT CONTRAST 12/05/2023 08:13:00 PM TECHNIQUE: CT of the abdomen and pelvis was performed without the administration of intravenous contrast. Multiplanar reformatted images are provided for review. Automated exposure control, iterative reconstruction, and/or weight-based adjustment of the mA/kV was utilized to reduce the radiation dose to as low as reasonably achievable. COMPARISON: CT 08/01/2021. CLINICAL HISTORY: Abdominal pain, acute, nonlocalized; recent abd pain admission, ongoing pain. BIB son from home for abd cramping and diarrhea. Describes as continued, recurrent. Seen here for the same previously, and admitted in the [facility] in [place] last week for the same. Returns for continued sx. Stool watery and black. States, But, they did not find any blood. Was constipated at the [facility]. Takes iron. FINDINGS: LOWER CHEST: No acute abnormality. LIVER: The liver is unremarkable. GALLBLADDER AND BILE DUCTS: Cholecystectomy. No biliary ductal dilatation. SPLEEN: No acute abnormality. PANCREAS: No acute abnormality. ADRENAL GLANDS: Increased size of the right adrenal nodule now measuring 4.9 x 2.8 cm (series 2, image 20), previously 1.1 x 1.9 cm on CT 08/01/2021. KIDNEYS, URETERS AND BLADDER: Heterogeneous exophytic mass extending from the lower pole of the right kidney measuring 2.3 cm. This is incompletely evaluated without IV contrast but is not substantially changed from 08/01/2021. 2.3 cm low-density cystic lesion in the mid pole of the right kidney. This previously demonstrated partial enhancement on 08/01/2021. Within the anterior left kidney there is a 2.2 cm lesion which previously demonstrated some enhancement. This has not changed in size. These lesions remain indeterminate. No stones in the kidneys or ureters. No hydronephrosis. No perinephric or periureteral stranding. Urinary bladder is  unremarkable. GI AND BOWEL: Stomach demonstrates no acute abnormality. Postoperative change about the right colon. There is no bowel obstruction. PERITONEUM AND RETROPERITONEUM: No ascites. No free air. VASCULATURE: Aorta is normal in caliber. Aortic atherosclerotic calcification. LYMPH NODES: No lymphadenopathy. REPRODUCTIVE ORGANS: Brachytherapy seeds in the prostate. BONES AND SOFT TISSUES: Advanced arthritis right hip. Fluid containing right inguinal hernia. No acute osseous abnormality. No focal soft tissue abnormality. IMPRESSION: 1. Marked interval growth of right adrenal nodule to 4.9 from 1.9 cm on 08/01/21; concerning for primary neoplasm or metastasis. Recommend clinical correlation and dedicated adrenal protocol CT or MRI. 2. Indeterminate renal lesions as described. These are incompletely evaluated without IV contrast but are not substantially changed in size from 6 / 16 / 23 Electronically signed by: Norman Gatlin MD 12/05/2023 08:27 PM EDT RP Workstation: [id number]    Microbiology: Results for orders placed or performed in  visit on 11/15/23  Urine Culture     Status: None   Collection Time: 11/15/23  3:11 PM   Specimen: Urine   UR  Result Value Ref Range Status   Urine Culture, Routine Final report  Final   Organism ID, Bacteria Comment  Final    Comment: Mixed urogenital flora 25,000-50,000 colony forming units per mL   Microscopic Examination     Status: Abnormal   Collection Time: 11/15/23  3:11 PM   Urine  Result Value Ref Range Status   WBC, UA 11-30 (A) 0 - 5 /hpf Final   RBC, Urine 0-2 0 - 2 /hpf Final   Epithelial Cells (non renal) 0-10 0 - 10 /hpf Final   Bacteria, UA Many (A) None seen/Few Final    Labs: CBC: Recent Labs  Lab 12/05/23 1751 12/06/23 0438 12/07/23 0839 12/08/23 0455 12/09/23 0350  WBC 3.0* 3.3* 2.8* 3.8* 3.3*  NEUTROABS 0.6*  --  0.7* 0.8* 0.8*  HGB 10.2* 9.1* 10.0* 9.3* 9.0*  HCT 31.1* 27.6* 30.4* 28.2* 27.0*  MCV 103.3* 103.0* 103.1*  101.8* 102.3*  PLT 282 241 277 233 216   Basic Metabolic Panel: Recent Labs  Lab 12/05/23 1755 12/06/23 0438 12/07/23 0839 12/08/23 0455 12/09/23 0350 12/10/23 0837  NA 130* 133* 133* 132* 134* 134*  K 6.2* 5.6* 5.9* 5.8* 5.3* 4.9  CL 100 104 102 102 104 104  CO2 20* 21* 22 21* 21* 21*  GLUCOSE 98 85 86 81 83 95  BUN 36* 32* 29* 29* 30* 27*  CREATININE 2.71* 2.09* 1.75* 1.80* 1.81* 1.63*  CALCIUM  9.5 9.2 9.7 9.2 9.0 9.4  MG 2.1 1.9  --   --   --   --   PHOS  --  3.9  --   --   --   --    Liver Function Tests: Recent Labs  Lab 12/05/23 1755 12/06/23 0438 12/07/23 0839 12/08/23 0455 12/09/23 0350  AST 19 18 19 17 18   ALT 7 7 7 6 6   ALKPHOS 62 56 59 52 51  BILITOT 0.5 0.4 0.6 0.5 0.4  PROT 7.5 6.7 7.4 6.7 6.5  ALBUMIN 4.0 3.6 4.0 3.6 3.5   CBG: Recent Labs  Lab 12/10/23 0740  GLUCAP 97    Discharge time spent: greater than 40 minutes.  Signed: Adriana DELENA Grams, MD [facility] 12/10/2023

## 2023-12-10 NOTE — Progress Notes (Signed)
 SPIRITUAL CARE AND COUNSELING CONSULT NOTE   VISIT SUMMARY   Reason for Visit: Chaplain responding to referral from Chaplain team attending IDT rounds and suggesting Pt  could use an extra layer of support  Description of Visit: Upon entering the room I found Derrick Long in the recliner receiving treatment and his Daughter Derrick Long was there as a support person.    I introduced myself as the chaplain for the cancer center and offered a brief education on the role of a chaplain and the support we can offer to our patients, caregivers, and staff.    Through exploration I discovered Derrick Long passion for the [employer] and learned that a number of his children and grandchildren served as well- many to retirement.  Derrick Long and his daughter also expressed to me their faith and hope in God and existence after this life that includes heaven and reward.  This gives them peace.  Derrick Long hopes to be discharged today and he is staying with his daughter.    SPIRITUAL ENCOUNTER                                                                                                                                                                      Type of Visit: Initial Care provided to:: Patient, Family (Daughter Derrick Long) Referral source: IDT Rounds Reason for visit: Routine spiritual support OnCall Visit: No   SPIRITUAL FRAMEWORK  Presenting Themes: Caregiving needs, Goals in life/care, Values and beliefs Values/beliefs: Pt values family and nation. Not only is he a [place] War veteran but he has a half dozen children and grandchildren who have served and retired from various branches of the [employer]. Community/Connection: Family Strengths: Possess wisdom and maturity; is humble and grateful for life   GOALS       INTERVENTIONS   Spiritual Care Interventions Made: Established relationship of care and support, Compassionate presence    INTERVENTION OUTCOMES   Outcomes: Awareness around  self/spiritual resourses  SPIRITUAL CARE PLAN   Spiritual Care Issues Still Outstanding: Chaplain will continue to follow Follow up plan : Will connect with Pt again on next visit to the Cancer Clinic     Maude Roll, MDiv Chaplain, [email or web address] [phone]  12/10/2023 10:13 AM

## 2023-12-14 LAB — PATHOLOGIST SMEAR REVIEW

## 2023-12-17 ENCOUNTER — Encounter: Payer: Self-pay | Admitting: Surgical

## 2023-12-17 ENCOUNTER — Ambulatory Visit: Admitting: Surgical

## 2023-12-17 ENCOUNTER — Other Ambulatory Visit: Payer: Self-pay

## 2023-12-17 DIAGNOSIS — M19011 Primary osteoarthritis, right shoulder: Secondary | ICD-10-CM

## 2023-12-17 DIAGNOSIS — M19012 Primary osteoarthritis, left shoulder: Secondary | ICD-10-CM

## 2023-12-17 MED ORDER — TRIAMCINOLONE ACETONIDE 40 MG/ML IJ SUSP
40.0000 mg | INTRAMUSCULAR | Status: AC | PRN
  Administered 2023-12-17: 40 mg via INTRA_ARTICULAR

## 2023-12-17 MED ORDER — BUPIVACAINE HCL 0.25 % IJ SOLN
9.0000 mL | INTRAMUSCULAR | Status: AC | PRN
  Administered 2023-12-17: 9 mL via INTRA_ARTICULAR

## 2023-12-17 NOTE — Progress Notes (Signed)
   Procedure Note  Patient: Derrick Long             Date of Birth: Nov 21, 1926           MRN: [id number]             Visit Date: 12/17/2023  Procedures: Visit Diagnoses:  1. Primary osteoarthritis, left shoulder   2. Primary osteoarthritis, right shoulder     Large Joint Inj: R glenohumeral on 12/17/2023 9:36 AM Indications: pain and diagnostic evaluation Details: 22 G 3.5 in needle, ultrasound-guided posterior approach Medications: 9 mL bupivacaine  0.25 %; 40 mg triamcinolone acetonide 40 MG/ML Outcome: tolerated well, no immediate complications Procedure, treatment alternatives, risks and benefits explained, specific risks discussed. Consent was given by the patient. Immediately prior to procedure a time out was called to verify the correct patient, procedure, equipment, support staff and site/side marked as required. Patient was prepped and draped in the usual sterile fashion.    Large Joint Inj: L glenohumeral on 12/17/2023 9:36 AM Indications: pain and diagnostic evaluation Details: 22 G 3.5 in needle, ultrasound-guided posterior approach Medications: 9 mL bupivacaine  0.25 %; 40 mg triamcinolone acetonide 40 MG/ML Outcome: tolerated well, no immediate complications Procedure, treatment alternatives, risks and benefits explained, specific risks discussed. Consent was given by the patient. Immediately prior to procedure a time out was called to verify the correct patient, procedure, equipment, support staff and site/side marked as required. Patient was prepped and draped in the usual sterile fashion.    Last injections gave great relief for 6 to 8 months.  Plan to repeat these as needed with next possible injection in 4 months.

## 2023-12-20 ENCOUNTER — Inpatient Hospital Stay: Attending: Oncology | Admitting: Oncology

## 2023-12-20 ENCOUNTER — Encounter: Payer: Self-pay | Admitting: Radiology

## 2023-12-20 VITALS — BP 139/64 | HR 71 | Temp 98.1°F | Resp 18

## 2023-12-20 DIAGNOSIS — E875 Hyperkalemia: Secondary | ICD-10-CM | POA: Diagnosis not present

## 2023-12-20 DIAGNOSIS — C61 Malignant neoplasm of prostate: Secondary | ICD-10-CM | POA: Insufficient documentation

## 2023-12-20 DIAGNOSIS — E895 Postprocedural testicular hypofunction: Secondary | ICD-10-CM | POA: Diagnosis not present

## 2023-12-20 DIAGNOSIS — E278 Other specified disorders of adrenal gland: Secondary | ICD-10-CM | POA: Diagnosis not present

## 2023-12-20 DIAGNOSIS — Z5111 Encounter for antineoplastic chemotherapy: Secondary | ICD-10-CM | POA: Diagnosis present

## 2023-12-20 DIAGNOSIS — Z79899 Other long term (current) drug therapy: Secondary | ICD-10-CM | POA: Diagnosis not present

## 2023-12-20 DIAGNOSIS — M549 Dorsalgia, unspecified: Secondary | ICD-10-CM | POA: Diagnosis not present

## 2023-12-20 DIAGNOSIS — R109 Unspecified abdominal pain: Secondary | ICD-10-CM | POA: Insufficient documentation

## 2023-12-20 DIAGNOSIS — Z7952 Long term (current) use of systemic steroids: Secondary | ICD-10-CM | POA: Insufficient documentation

## 2023-12-20 DIAGNOSIS — C7971 Secondary malignant neoplasm of right adrenal gland: Secondary | ICD-10-CM | POA: Insufficient documentation

## 2023-12-20 DIAGNOSIS — N179 Acute kidney failure, unspecified: Secondary | ICD-10-CM | POA: Insufficient documentation

## 2023-12-20 MED ORDER — TRAMADOL HCL 50 MG PO TABS: 50.0000 mg | ORAL_TABLET | Freq: Four times a day (QID) | ORAL | 0 refills | Status: AC | PRN

## 2023-12-20 NOTE — Assessment & Plan Note (Addendum)
-  Previous treatment included degarelix  (07/16/2020), Eligard  45 mg (08/21/2020), abiraterone  100 mg daily/prednisone  5 mg (01/22/2021) and darolutamide  300 mg (06/19/2021) -Patient had Guardant360 and germline testing which were both negative. -Patient is not a candidate for chemotherapy.  No targetable mutations. -Referral was sent to palliative care.  Patient reports he sees palliative medicine at the [facility]. -She was recently hospitalized for abdominal pain and found to have enlarging adrenal mass.  -Discussed case with Dr. Jillian who recommends restarting Eligard  and referral to palliative medicine.  He will need to get Eligard  authorized and he can start this every 6 months. -No recent labs except for when he was in the hospital.  Return to clinic PSAs completed for Eligard  and draw labs.

## 2023-12-20 NOTE — Progress Notes (Signed)
 [facility] OFFICE PROGRESS NOTE  Derrick Doffing, MD  ASSESSMENT & PLAN:   Assessment & Plan Prostate cancer [facility])  -Previous treatment included degarelix  (07/16/2020), Eligard  45 mg (08/21/2020), abiraterone  100 mg daily/prednisone  5 mg (01/22/2021) and darolutamide  300 mg (06/19/2021) -Patient had Guardant360 and germline testing which were both negative. -Patient is not a candidate for chemotherapy.  No targetable mutations. -Referral was sent to palliative care.  Patient reports he sees palliative medicine at the [facility]. -She was recently hospitalized for abdominal pain and found to have enlarging adrenal mass.  -Discussed case with Dr. Jillian who recommends restarting Eligard  and referral to palliative medicine.  He will need to get Eligard  authorized and he can start this every 6 months. -No recent labs except for when he was in the hospital.  Return to clinic PSAs completed for Eligard  and draw labs.  Orders Placed This Encounter  Procedures   CBC with Differential    Standing Status:   Future    Expected Date:   12/27/2023    Expiration Date:   03/26/2024   Comprehensive metabolic panel    Standing Status:   Future    Expected Date:   12/27/2023    Expiration Date:   03/26/2024    INTERVAL HISTORY: Patient returns for follow-up.  Patient was recently hospitalized for abdominal pain and found to have severe diarrhea with very little oral intake.  He had acute renal failure and hyperkalemia.  Most recent CT abdomen without contrast showed marked interval growth of right adrenal nodule to 4.9 cm from 1.9 cm.  Patient had Guardant360 which did not reveal any reportable mutations.  Germline testing negative.  Unfortunately, there are no additional treatments to offer for this patient.  Patient is not a candidate for chemotherapy.  Recommend palliative care.  Patient presents today with his daughter.  Overall, he is doing well.  Pain in his back is tolerable.  He takes as  needed.  Daughter is asking for refill on his tramadol .  Referral to palliative medicine sent.  This is scheduled for 01/06/2024.  Daughter reports he does have palliative care with the [facility].  We reviewed recent hospital stay.  SUMMARY OF HEMATOLOGIC HISTORY: Oncology History Overview Note  1.  Metastatic prostate cancer to the right adrenal gland: -Presentation to the ER with worsening right-sided back pain, which has been on and off for the last couple of years. - Some decrease in appetite with 6 pound weight loss in the last 6 months.  No hematuria or other B symptoms. - CT renal study on 05/03/2020 showed new right adrenal mass with bilateral renal lesions.  Indeterminate L2 vertebral bodies sclerotic lesion.  Low-attenuation lesions in the kidneys measure up to 2.3 cm on the right and were similar to scan from July 2021. - CT CAP on 06/12/2020 did not show any evidence of nodal metastasis or metastatic disease in the chest.  Right adrenal mass measures 4.2 x 3.8 cm exerts mass-effect upon the posterior and right lateral wall of the IVC.  Right kidney masses measuring 2.3 x 2.3 cm and 1.9 x 1.7 cm suspicious for RCC.  Upper pole of the left kidney lesion 2.2 x 2.0 cm and medial cortex of the inferior pole measuring 2 x 2 cm and another interpolar left kidney lesion measuring 1.3 x 1.5 cm. - Bone scan on 06/13/2020 was negative. - Right adrenal biopsy consistent with prostatic adenocarcinoma - Degarelix  on 07/16/2020, transition to Eligard  45 mg on  08/21/2020. - Abiraterone  100 mg daily/prednisone  5 mg daily started around 01/22/2021.,  Discontinued in April 2023.  Not a candidate for Erleada due to blood thinner. - Darolutamide  300 mg twice daily started on 06/19/2021, increased to 600 mg twice daily on 06/26/2021, cut back to 300 mg twice daily on 07/07/2021 due to decreased oral intake and tiredness.   2.  Social/family history: - He lives at home with his daughter.  He walks with a cane. - He  mows lawn with a riding mower.  He has a garden and gross vegetables.  Worked as a midwife in a publishing rights manager.  Quit smoking 25 years ago. - No family history of malignancies that he is aware of.   Prostate cancer (HCC)  07/10/2020 Initial Diagnosis   Prostate cancer (HCC)    Genetic Testing   Negative genetic testing. No pathogenic variants identified on the [facility] Common Hereditary Cancers+RNA panel. The report date is 01/09/2023.  The Common Hereditary Cancers Panel + RNA offered by [facility] includes sequencing and/or deletion duplication testing of the following 48 genes: APC*, ATM*, AXIN2, BAP1, BARD1, BMPR1A, BRCA1, BRCA2, BRIP1, CDH1, CDK4, CDKN2A (p14ARF), CDKN2A (p16INK4a), CHEK2, CTNNA1, DICER1*, EPCAM*, FH*, GREM1*, HOXB13, KIT, MBD4, MEN1*, MLH1*, MSH2*, MSH3*, MSH6*, MUTYH, NF1*, NTHL1, PALB2, PDGFRA, PMS2*, POLD1*, POLE, PTEN*, RAD51C, RAD51D, SDHA*, SDHB, SDHC*, SDHD, SMAD4, SMARCA4, STK11, TP53, TSC1*, TSC2, VHL.       CBC    Component Value Date/Time   WBC 3.3 (L) 12/09/2023 0350   RBC 2.64 (L) 12/09/2023 0350   HGB 9.0 (L) 12/09/2023 0350   HCT 27.0 (L) 12/09/2023 0350   PLT 216 12/09/2023 0350   MCV 102.3 (H) 12/09/2023 0350   MCH 34.1 (H) 12/09/2023 0350   MCHC 33.3 12/09/2023 0350   RDW 12.8 12/09/2023 0350   LYMPHSABS 1.8 12/09/2023 0350   MONOABS 0.4 12/09/2023 0350   EOSABS 0.2 12/09/2023 0350   BASOSABS 0.0 12/09/2023 0350       Latest Ref Rng & Units 12/10/2023    8:37 AM 12/09/2023    3:50 AM 12/08/2023    4:55 AM  CMP  Glucose 70 - 99 mg/dL 95  83  81   BUN 8 - 23 mg/dL 27  30  29    Creatinine 0.61 - 1.24 mg/dL 8.36  8.18  8.19   Sodium 135 - 145 mmol/L 134  134  132   Potassium 3.5 - 5.1 mmol/L 4.9  5.3  5.8   Chloride 98 - 111 mmol/L 104  104  102   CO2 22 - 32 mmol/L 21  21  21    Calcium  8.9 - 10.3 mg/dL 9.4  9.0  9.2   Total Protein 6.5 - 8.1 g/dL  6.5  6.7   Total Bilirubin 0.0 - 1.2 mg/dL  0.4  0.5   Alkaline Phos 38 - 126 U/L  51   52   AST 15 - 41 U/L  18  17   ALT 0 - 44 U/L  6  6      Lab Results  Component Value Date   FERRITIN 231 12/24/2022   VITAMINB12 383 12/06/2023    Vitals:   12/20/23 1253  BP: 139/64  Pulse: 71  Resp: 18  Temp: 98.1 F (36.7 C)  SpO2: 100%    Review of System:  Review of Systems  Constitutional:  Positive for malaise/fatigue.  Musculoskeletal:  Positive for back pain.  Neurological:  Negative for dizziness.    Physical Exam: Physical  Exam Constitutional:      Appearance: Normal appearance.  HENT:     Head: Normocephalic and atraumatic.  Eyes:     Pupils: Pupils are equal, round, and reactive to light.  Cardiovascular:     Rate and Rhythm: Normal rate and regular rhythm.     Heart sounds: Normal heart sounds. No murmur heard. Pulmonary:     Effort: Pulmonary effort is normal.     Breath sounds: Normal breath sounds. No wheezing.  Abdominal:     General: Bowel sounds are normal. There is no distension.     Palpations: Abdomen is soft.     Tenderness: There is no abdominal tenderness.  Musculoskeletal:        General: Normal range of motion.     Cervical back: Normal range of motion.  Skin:    General: Skin is warm and dry.     Findings: No rash.  Neurological:     Mental Status: He is alert and oriented to person, place, and time.     Gait: Gait is intact.  Psychiatric:        Mood and Affect: Mood and affect normal.        Cognition and Memory: Memory normal.        Judgment: Judgment normal.      I spent 25 minutes dedicated to the care of this patient (face-to-face and non-face-to-face) on the date of the encounter to include what is described in the assessment and plan.,  Delon Hope, NP 12/20/2023 3:07 PM

## 2023-12-21 ENCOUNTER — Encounter (HOSPITAL_COMMUNITY): Payer: Self-pay | Admitting: Oncology

## 2023-12-23 ENCOUNTER — Other Ambulatory Visit: Payer: Self-pay | Admitting: Oncology

## 2023-12-23 DIAGNOSIS — C61 Malignant neoplasm of prostate: Secondary | ICD-10-CM

## 2023-12-28 ENCOUNTER — Inpatient Hospital Stay

## 2023-12-28 VITALS — BP 149/66 | HR 60 | Temp 98.0°F | Resp 18

## 2023-12-28 DIAGNOSIS — C61 Malignant neoplasm of prostate: Secondary | ICD-10-CM

## 2023-12-28 LAB — CBC WITH DIFFERENTIAL/PLATELET
Abs Immature Granulocytes: 0.02 K/uL (ref 0.00–0.07)
Basophils Absolute: 0 K/uL (ref 0.0–0.1)
Basophils Relative: 0 %
Eosinophils Absolute: 0.1 K/uL (ref 0.0–0.5)
Eosinophils Relative: 2 %
HCT: 27.6 % — ABNORMAL LOW (ref 39.0–52.0)
Hemoglobin: 9 g/dL — ABNORMAL LOW (ref 13.0–17.0)
Immature Granulocytes: 0 %
Lymphocytes Relative: 31 %
Lymphs Abs: 1.4 K/uL (ref 0.7–4.0)
MCH: 34.4 pg — ABNORMAL HIGH (ref 26.0–34.0)
MCHC: 32.6 g/dL (ref 30.0–36.0)
MCV: 105.3 fL — ABNORMAL HIGH (ref 80.0–100.0)
Monocytes Absolute: 0.6 K/uL (ref 0.1–1.0)
Monocytes Relative: 14 %
Neutro Abs: 2.3 K/uL (ref 1.7–7.7)
Neutrophils Relative %: 53 %
Platelets: 240 K/uL (ref 150–400)
RBC: 2.62 MIL/uL — ABNORMAL LOW (ref 4.22–5.81)
RDW: 14.6 % (ref 11.5–15.5)
WBC: 4.5 K/uL (ref 4.0–10.5)
nRBC: 0 % (ref 0.0–0.2)

## 2023-12-28 LAB — COMPREHENSIVE METABOLIC PANEL WITH GFR
ALT: 7 U/L (ref 0–44)
AST: 17 U/L (ref 15–41)
Albumin: 3.6 g/dL (ref 3.5–5.0)
Alkaline Phosphatase: 65 U/L (ref 38–126)
Anion gap: 10 (ref 5–15)
BUN: 12 mg/dL (ref 8–23)
CO2: 23 mmol/L (ref 22–32)
Calcium: 8.7 mg/dL — ABNORMAL LOW (ref 8.9–10.3)
Chloride: 105 mmol/L (ref 98–111)
Creatinine, Ser: 0.98 mg/dL (ref 0.61–1.24)
GFR, Estimated: >60 mL/min (ref >60)
Glucose, Bld: 87 mg/dL (ref 70–99)
Potassium: 3.8 mmol/L (ref 3.5–5.1)
Sodium: 138 mmol/L (ref 135–145)
Total Bilirubin: 0.6 mg/dL (ref 0.0–1.2)
Total Protein: 6.6 g/dL (ref 6.5–8.1)

## 2023-12-28 MED ORDER — LEUPROLIDE ACETATE (6 MONTH) 45 MG IM KIT: 45.0000 mg | PACK | Freq: Once | INTRAMUSCULAR | Status: DC

## 2023-12-28 MED ORDER — LEUPROLIDE ACETATE (6 MONTH) 45 MG ~~LOC~~ KIT
45.0000 mg | PACK | Freq: Once | SUBCUTANEOUS | Status: AC
  Administered 2023-12-28: 45 mg via SUBCUTANEOUS
  Filled 2023-12-28: qty 45

## 2023-12-28 NOTE — Patient Instructions (Signed)
 [facility] [place] - A DEPT OF [place]  Discharge Instructions: Thank you for choosing [facility] to provide your oncology and hematology care.  If you have a lab appointment with the Cancer Center - please note that after April 8th, 2024, all labs will be drawn in the cancer center.  You do not have to check in or register with the main entrance as you have in the past but will complete your check-in in the cancer center.  Wear comfortable clothing and clothing appropriate for easy access to any Portacath or PICC line.   We strive to give you quality time with your provider. You may need to reschedule your appointment if you arrive late (15 or more minutes).  Arriving late affects you and other patients whose appointments are after yours.  Also, if you miss three or more appointments without notifying the office, you may be dismissed from the clinic at the provider's discretion.      For prescription refill requests, have your pharmacy contact our office and allow 72 hours for refills to be completed.    Today you received the following Eligard , return as scheduled.   To help prevent nausea and vomiting after your treatment, we encourage you to take your nausea medication as directed.  BELOW ARE SYMPTOMS THAT SHOULD BE REPORTED IMMEDIATELY: *FEVER GREATER THAN 100.4 F (38 C) OR HIGHER *CHILLS OR SWEATING *NAUSEA AND VOMITING THAT IS NOT CONTROLLED WITH YOUR NAUSEA MEDICATION *UNUSUAL SHORTNESS OF BREATH *UNUSUAL BRUISING OR BLEEDING *URINARY PROBLEMS (pain or burning when urinating, or frequent urination) *BOWEL PROBLEMS (unusual diarrhea, constipation, pain near the anus) TENDERNESS IN MOUTH AND THROAT WITH OR WITHOUT PRESENCE OF ULCERS (sore throat, sores in mouth, or a toothache) UNUSUAL RASH, SWELLING OR PAIN  UNUSUAL VAGINAL DISCHARGE OR ITCHING   Items with * indicate a potential emergency and should be followed up as soon as possible or  go to the Emergency Department if any problems should occur.  Please show the CHEMOTHERAPY ALERT CARD or IMMUNOTHERAPY ALERT CARD at check-in to the Emergency Department and triage nurse.  Should you have questions after your visit or need to cancel or reschedule your appointment, please contact [facility] [place] - A DEPT OF [facility] [phone]  and follow the prompts.  Office hours are 8:00 a.m. to 4:30 p.m. Monday - Friday. Please note that voicemails left after 4:00 p.m. may not be returned until the following business day.  We are closed weekends and major holidays. You have access to a nurse at all times for urgent questions. Please call the main number to the clinic [phone] and follow the prompts.  For any non-urgent questions, you may also contact your provider using MyChart. We now offer e-Visits for anyone 48 and older to request care online for non-urgent symptoms. For details visit [email or web address].   Also download the MyChart app! Go to the app store, search MyChart, open the app, select [place], and log in with your MyChart username and password.

## 2023-12-28 NOTE — Progress Notes (Signed)
 Patient tolerated injection with no complaints voiced. Site clean and dry with no bruising or swelling noted at site. See MAR for details. Band aid applied.  Patient stable during and after injection. VSS with discharge and left in satisfactory condition with no s/s of distress noted.

## 2023-12-29 ENCOUNTER — Ambulatory Visit: Payer: Self-pay | Admitting: Oncology

## 2023-12-29 LAB — TESTOSTERONE: Testosterone: <3 ng/dL — ABNORMAL LOW (ref 264–916)

## 2023-12-29 NOTE — Progress Notes (Signed)
 Testosterone was less than 3.  Just an FYI.  This was the guy that we talked about who has metastatic prostate cancer and was interested in ADT treatment.  He is receiving this every 6 months.  Delon Hope, NP 12/29/2023 2:58 PM

## 2024-02-14 ENCOUNTER — Encounter: Payer: Self-pay | Admitting: *Deleted

## 2024-02-23 ENCOUNTER — Other Ambulatory Visit: Payer: Self-pay

## 2024-02-23 ENCOUNTER — Inpatient Hospital Stay (HOSPITAL_COMMUNITY)

## 2024-02-23 ENCOUNTER — Emergency Department (HOSPITAL_COMMUNITY)

## 2024-02-23 ENCOUNTER — Encounter (HOSPITAL_COMMUNITY): Payer: Self-pay

## 2024-02-23 ENCOUNTER — Inpatient Hospital Stay (HOSPITAL_COMMUNITY)
Admission: EM | Admit: 2024-02-23 | Discharge: 2024-02-25 | DRG: 291 | Disposition: A | Discharge status: Home / Self Care | Attending: Internal Medicine | Admitting: Internal Medicine

## 2024-02-23 DIAGNOSIS — R399 Unspecified symptoms and signs involving the genitourinary system: Secondary | ICD-10-CM | POA: Diagnosis present

## 2024-02-23 DIAGNOSIS — N1831 Chronic kidney disease, stage 3a: Secondary | ICD-10-CM | POA: Diagnosis present

## 2024-02-23 DIAGNOSIS — E876 Hypokalemia: Secondary | ICD-10-CM | POA: Diagnosis present

## 2024-02-23 DIAGNOSIS — I509 Heart failure, unspecified: Principal | ICD-10-CM

## 2024-02-23 DIAGNOSIS — G8929 Other chronic pain: Secondary | ICD-10-CM | POA: Diagnosis present

## 2024-02-23 DIAGNOSIS — Z66 Do not resuscitate: Secondary | ICD-10-CM | POA: Diagnosis present

## 2024-02-23 DIAGNOSIS — I480 Paroxysmal atrial fibrillation: Secondary | ICD-10-CM | POA: Diagnosis present

## 2024-02-23 DIAGNOSIS — I482 Chronic atrial fibrillation, unspecified: Secondary | ICD-10-CM | POA: Diagnosis present

## 2024-02-23 DIAGNOSIS — D630 Anemia in neoplastic disease: Secondary | ICD-10-CM | POA: Diagnosis present

## 2024-02-23 DIAGNOSIS — I130 Hypertensive heart and chronic kidney disease with heart failure and stage 1 through stage 4 chronic kidney disease, or unspecified chronic kidney disease: Principal | ICD-10-CM | POA: Diagnosis present

## 2024-02-23 DIAGNOSIS — Z1152 Encounter for screening for COVID-19: Secondary | ICD-10-CM | POA: Diagnosis not present

## 2024-02-23 DIAGNOSIS — Z87891 Personal history of nicotine dependence: Secondary | ICD-10-CM

## 2024-02-23 DIAGNOSIS — I714 Abdominal aortic aneurysm, without rupture, unspecified: Secondary | ICD-10-CM | POA: Diagnosis present

## 2024-02-23 DIAGNOSIS — C7970 Secondary malignant neoplasm of unspecified adrenal gland: Secondary | ICD-10-CM | POA: Diagnosis present

## 2024-02-23 DIAGNOSIS — Z9049 Acquired absence of other specified parts of digestive tract: Secondary | ICD-10-CM

## 2024-02-23 DIAGNOSIS — I110 Hypertensive heart disease with heart failure: Secondary | ICD-10-CM | POA: Diagnosis not present

## 2024-02-23 DIAGNOSIS — N401 Enlarged prostate with lower urinary tract symptoms: Secondary | ICD-10-CM | POA: Diagnosis present

## 2024-02-23 DIAGNOSIS — Z515 Encounter for palliative care: Secondary | ICD-10-CM | POA: Diagnosis not present

## 2024-02-23 DIAGNOSIS — E785 Hyperlipidemia, unspecified: Secondary | ICD-10-CM | POA: Diagnosis present

## 2024-02-23 DIAGNOSIS — M109 Gout, unspecified: Secondary | ICD-10-CM | POA: Diagnosis present

## 2024-02-23 DIAGNOSIS — E43 Unspecified severe protein-calorie malnutrition: Secondary | ICD-10-CM | POA: Diagnosis present

## 2024-02-23 DIAGNOSIS — I5023 Acute on chronic systolic (congestive) heart failure: Secondary | ICD-10-CM | POA: Diagnosis not present

## 2024-02-23 DIAGNOSIS — C61 Malignant neoplasm of prostate: Secondary | ICD-10-CM | POA: Diagnosis present

## 2024-02-23 DIAGNOSIS — Z79899 Other long term (current) drug therapy: Secondary | ICD-10-CM

## 2024-02-23 DIAGNOSIS — Z950 Presence of cardiac pacemaker: Secondary | ICD-10-CM | POA: Diagnosis not present

## 2024-02-23 DIAGNOSIS — I214 Non-ST elevation (NSTEMI) myocardial infarction: Secondary | ICD-10-CM

## 2024-02-23 DIAGNOSIS — E162 Hypoglycemia, unspecified: Secondary | ICD-10-CM | POA: Diagnosis present

## 2024-02-23 DIAGNOSIS — I5033 Acute on chronic diastolic (congestive) heart failure: Secondary | ICD-10-CM | POA: Diagnosis present

## 2024-02-23 DIAGNOSIS — F32A Depression, unspecified: Secondary | ICD-10-CM | POA: Diagnosis present

## 2024-02-23 DIAGNOSIS — E559 Vitamin D deficiency, unspecified: Secondary | ICD-10-CM | POA: Diagnosis present

## 2024-02-23 DIAGNOSIS — M549 Dorsalgia, unspecified: Secondary | ICD-10-CM | POA: Diagnosis present

## 2024-02-23 DIAGNOSIS — I10 Essential (primary) hypertension: Secondary | ICD-10-CM | POA: Diagnosis present

## 2024-02-23 DIAGNOSIS — Z87442 Personal history of urinary calculi: Secondary | ICD-10-CM

## 2024-02-23 DIAGNOSIS — F0393 Unspecified dementia, unspecified severity, with mood disturbance: Secondary | ICD-10-CM | POA: Diagnosis present

## 2024-02-23 DIAGNOSIS — D631 Anemia in chronic kidney disease: Secondary | ICD-10-CM | POA: Diagnosis present

## 2024-02-23 DIAGNOSIS — Z8546 Personal history of malignant neoplasm of prostate: Secondary | ICD-10-CM

## 2024-02-23 DIAGNOSIS — Z681 Body mass index (BMI) 19 or less, adult: Secondary | ICD-10-CM

## 2024-02-23 DIAGNOSIS — I5031 Acute diastolic (congestive) heart failure: Secondary | ICD-10-CM | POA: Diagnosis not present

## 2024-02-23 DIAGNOSIS — Z6823 Body mass index (BMI) 23.0-23.9, adult: Secondary | ICD-10-CM

## 2024-02-23 LAB — COMPREHENSIVE METABOLIC PANEL WITH GFR
ALT: <5 U/L (ref 0–44)
AST: 17 U/L (ref 15–41)
Albumin: 3.5 g/dL (ref 3.5–5.0)
Alkaline Phosphatase: 55 U/L (ref 38–126)
Anion gap: 16 — ABNORMAL HIGH (ref 5–15)
BUN: 11 mg/dL (ref 8–23)
CO2: 22 mmol/L (ref 22–32)
Calcium: 9 mg/dL (ref 8.9–10.3)
Chloride: 96 mmol/L — ABNORMAL LOW (ref 98–111)
Creatinine, Ser: 0.99 mg/dL (ref 0.61–1.24)
GFR, Estimated: >60 mL/min
Glucose, Bld: 66 mg/dL — ABNORMAL LOW (ref 70–99)
Potassium: 3.3 mmol/L — ABNORMAL LOW (ref 3.5–5.1)
Sodium: 134 mmol/L — ABNORMAL LOW (ref 135–145)
Total Bilirubin: 1.1 mg/dL (ref 0.0–1.2)
Total Protein: 7 g/dL (ref 6.5–8.1)

## 2024-02-23 LAB — CBC WITH DIFFERENTIAL/PLATELET
Abs Immature Granulocytes: 0.02 K/uL (ref 0.00–0.07)
Basophils Absolute: 0 K/uL (ref 0.0–0.1)
Basophils Relative: 1 %
Eosinophils Absolute: 0.1 K/uL (ref 0.0–0.5)
Eosinophils Relative: 2 %
HCT: 29.7 % — ABNORMAL LOW (ref 39.0–52.0)
Hemoglobin: 9.7 g/dL — ABNORMAL LOW (ref 13.0–17.0)
Immature Granulocytes: 1 %
Lymphocytes Relative: 37 %
Lymphs Abs: 1.4 K/uL (ref 0.7–4.0)
MCH: 33.6 pg (ref 26.0–34.0)
MCHC: 32.7 g/dL (ref 30.0–36.0)
MCV: 102.8 fL — ABNORMAL HIGH (ref 80.0–100.0)
Monocytes Absolute: 0.6 K/uL (ref 0.1–1.0)
Monocytes Relative: 15 %
Neutro Abs: 1.7 K/uL (ref 1.7–7.7)
Neutrophils Relative %: 44 %
Platelets: 251 K/uL (ref 150–400)
RBC: 2.89 MIL/uL — ABNORMAL LOW (ref 4.22–5.81)
RDW: 12.7 % (ref 11.5–15.5)
WBC: 3.8 K/uL — ABNORMAL LOW (ref 4.0–10.5)
nRBC: 0 % (ref 0.0–0.2)

## 2024-02-23 LAB — URINALYSIS, COMPLETE (UACMP) WITH MICROSCOPIC
Bacteria, UA: NONE SEEN
Bilirubin Urine: NEGATIVE
Glucose, UA: NEGATIVE mg/dL
Ketones, ur: NEGATIVE mg/dL
Leukocytes,Ua: NEGATIVE
Nitrite: NEGATIVE
Protein, ur: NEGATIVE mg/dL
Specific Gravity, Urine: 1.004 — ABNORMAL LOW (ref 1.005–1.030)
pH: 6 (ref 5.0–8.0)

## 2024-02-23 LAB — PROTEIN / CREATININE RATIO, URINE
Creatinine, Urine: 17 mg/dL
Protein Creatinine Ratio: 0.4 mg/mg — ABNORMAL HIGH
Total Protein, Urine: 6 mg/dL

## 2024-02-23 LAB — TROPONIN T, HIGH SENSITIVITY
Troponin T High Sensitivity: 125 ng/L (ref 0–19)
Troponin T High Sensitivity: 105 ng/L (ref 0–19)

## 2024-02-23 LAB — PRO BRAIN NATRIURETIC PEPTIDE: Pro Brain Natriuretic Peptide: 12607 pg/mL — ABNORMAL HIGH

## 2024-02-23 MED ORDER — POTASSIUM CHLORIDE 10 MEQ/100ML IV SOLN
10.0000 meq | Freq: Once | INTRAVENOUS | Status: AC
  Administered 2024-02-23: 10 meq via INTRAVENOUS
  Filled 2024-02-23: qty 100

## 2024-02-23 MED ORDER — DEXTROSE 50 % IV SOLN
25.0000 mL | Freq: Once | INTRAVENOUS | Status: AC
  Administered 2024-02-23: 25 mL via INTRAVENOUS
  Filled 2024-02-23: qty 50

## 2024-02-23 MED ORDER — ACETAMINOPHEN 325 MG PO TABS
650.0000 mg | ORAL_TABLET | ORAL | Status: DC | PRN
  Administered 2024-02-23: 650 mg via ORAL
  Filled 2024-02-23: qty 2

## 2024-02-23 MED ORDER — TRAMADOL HCL 50 MG PO TABS
50.0000 mg | ORAL_TABLET | Freq: Two times a day (BID) | ORAL | Status: DC | PRN
  Administered 2024-02-24: 50 mg via ORAL
  Filled 2024-02-23: qty 1

## 2024-02-23 MED ORDER — CARVEDILOL 3.125 MG PO TABS
3.1250 mg | ORAL_TABLET | Freq: Two times a day (BID) | ORAL | Status: DC
  Administered 2024-02-23 – 2024-02-25 (×4): 3.125 mg via ORAL
  Filled 2024-02-23 (×4): qty 1

## 2024-02-23 MED ORDER — POTASSIUM CHLORIDE CRYS ER 20 MEQ PO TBCR
40.0000 meq | EXTENDED_RELEASE_TABLET | Freq: Once | ORAL | Status: AC
  Administered 2024-02-23: 40 meq via ORAL
  Filled 2024-02-23: qty 2

## 2024-02-23 MED ORDER — HEPARIN SODIUM (PORCINE) 5000 UNIT/ML IJ SOLN
5000.0000 [IU] | Freq: Three times a day (TID) | INTRAMUSCULAR | Status: DC
  Administered 2024-02-23 – 2024-02-25 (×6): 5000 [IU] via SUBCUTANEOUS
  Filled 2024-02-23 (×6): qty 1

## 2024-02-23 MED ORDER — TAMSULOSIN HCL 0.4 MG PO CAPS
0.4000 mg | ORAL_CAPSULE | Freq: Two times a day (BID) | ORAL | Status: DC
  Administered 2024-02-23 – 2024-02-25 (×4): 0.4 mg via ORAL
  Filled 2024-02-23 (×4): qty 1

## 2024-02-23 MED ORDER — PANTOPRAZOLE SODIUM 40 MG PO TBEC
40.0000 mg | DELAYED_RELEASE_TABLET | Freq: Every day | ORAL | Status: DC
  Administered 2024-02-23 – 2024-02-24 (×2): 40 mg via ORAL
  Filled 2024-02-23 (×2): qty 1

## 2024-02-23 MED ORDER — FERROUS SULFATE 325 (65 FE) MG PO TABS
324.0000 mg | ORAL_TABLET | Freq: Every day | ORAL | Status: DC
  Filled 2024-02-23: qty 1

## 2024-02-23 MED ORDER — MELATONIN 3 MG PO TABS
6.0000 mg | ORAL_TABLET | Freq: Every day | ORAL | Status: DC
  Administered 2024-02-23 – 2024-02-24 (×2): 6 mg via ORAL
  Filled 2024-02-23 (×2): qty 2

## 2024-02-23 MED ORDER — FUROSEMIDE 10 MG/ML IJ SOLN
40.0000 mg | Freq: Two times a day (BID) | INTRAMUSCULAR | Status: DC
  Filled 2024-02-23: qty 4

## 2024-02-23 MED ORDER — SODIUM CHLORIDE 0.9 % IV SOLN: 250.0000 mL | INTRAVENOUS | Status: AC | PRN

## 2024-02-23 MED ORDER — SENNA 8.6 MG PO TABS
1.0000 | ORAL_TABLET | Freq: Every day | ORAL | Status: DC
  Administered 2024-02-23 – 2024-02-24 (×2): 8.6 mg via ORAL
  Filled 2024-02-23 (×2): qty 1

## 2024-02-23 MED ORDER — LORAZEPAM 2 MG/ML IJ SOLN
INTRAMUSCULAR | Status: AC
  Filled 2024-02-23: qty 1

## 2024-02-23 MED ORDER — SODIUM CHLORIDE 0.9% FLUSH
3.0000 mL | Freq: Two times a day (BID) | INTRAVENOUS | Status: DC
  Administered 2024-02-23 – 2024-02-25 (×4): 3 mL via INTRAVENOUS

## 2024-02-23 MED ORDER — SODIUM CHLORIDE 0.9% FLUSH: 3.0000 mL | INTRAVENOUS | Status: DC | PRN

## 2024-02-23 MED ORDER — FUROSEMIDE 10 MG/ML IJ SOLN
60.0000 mg | Freq: Once | INTRAMUSCULAR | Status: AC
  Administered 2024-02-23: 60 mg via INTRAVENOUS
  Filled 2024-02-23: qty 6

## 2024-02-23 NOTE — ED Provider Notes (Signed)
 " [place] EMERGENCY DEPARTMENT AT [facility] Provider Note   CSN: [id number] Arrival date & time: 02/23/24  9044     Patient presents with: Leg Swelling   Derrick Long is a 89 y.o. male.  HPI  89 yo male with h/o afib not on blood thinner, pacemaker, edema, BPH, possible kidney cancer, prostate cancer, AAA presents with leg swelling. Seen at [facility] about 2 weeks ago with cough and was diagnosed with pneumonia and sent home on antibiotics (2 types) and increased lasix . At that time also increased lasix  from 10mg  to 40mg  for 5 days then to 20mg  daily which he is on now. No improvement in swelling. No CP, SOB, orthopnea. Also has chronic left sided back pain.  Last ECHO 2023 EF 65-70 with grade 1 diastolic dysfunction.    Prior to Admission medications  Medication Sig Start Date End Date Taking? Authorizing Provider  lisinopril (ZESTRIL) 20 MG tablet Take 20 mg by mouth daily. for high blood pressure 02/04/24  Yes [provider]  acetaminophen  (TYLENOL ) 650 MG CR tablet Take 1,300 mg by mouth every 8 (eight) hours as needed for pain.    [provider]  carvedilol  (COREG ) 3.125 MG tablet Take 3.125 mg by mouth 2 (two) times daily with a meal.    [provider]  diclofenac  sodium (VOLTAREN ) 1 % GEL Apply 2 g topically 4 (four) times daily. Patient taking differently: Apply 2 g topically daily as needed (pain). 06/19/17   Rancour, Garnette, MD  Ensure (ENSURE) Take 237 mLs by mouth daily as needed (nutrition). Vanilla or chocolate    [provider]  ferrous sulfate  324 MG TBEC Take 324 mg by mouth. Takes one tablet every other day    [provider]  furosemide  (LASIX ) 20 MG tablet Take 10 mg by mouth daily. One half tablet daily 12/04/21   [provider]  pantoprazole  (PROTONIX ) 40 MG tablet TAKE 1 TABLET BY MOUTH ONCE DAILY. 08/02/23   Carlan, Chelsea L, NP  senna (SENOKOT) 8.6 MG TABS tablet Take 1 tablet (8.6 mg total) by  mouth at bedtime. 12/10/23   Shahmehdi, Adriana LABOR, MD  tamsulosin  (FLOMAX ) 0.4 MG CAPS capsule Take 1 capsule (0.4 mg total) by mouth 2 (two) times daily. 09/15/23   McKenzie, Belvie CROME, MD  traMADol  (ULTRAM ) 50 MG tablet Take 1 tablet (50 mg total) by mouth every 6 (six) hours as needed. 12/20/23   Geofm Delon BRAVO, NP    Allergies: Patient has no known allergies.    Review of Systems  Positive for edema, generalized weakness, left-sided back pain.  BP (!) 150/84 (BP Location: Right Arm)   Pulse 87   Temp 97.8 F (36.6 C) (Oral)   Resp 18   Ht 6' 1 (1.854 m)   Wt 79.2 kg   SpO2 100%   BMI 23.04 kg/m   Physical Exam  Constitutional: Elderly gentleman seated on stretcher in no distress.  HENT:  Head: Normocephalic and atraumatic.  Mouth/Throat: Oropharynx is clear and moist. No oropharyngeal exudate.  Eyes: Conjunctivae are normal. Pupils are equal, round, and reactive to light. Neck: Normal range of motion. Neck supple.  Cardiovascular: Normal rate, regular rhythm, normal heart sounds and intact distal pulses.   Pulmonary/Chest: Effort normal and breath sounds normal. No respiratory distress. No wheezes or rales.  Abdominal: Soft. Bowel sounds are normal. No distension or tenderness.  Musculoskeletal: Normal range of motion.  +2 pitting edema bilateral ankles.  Left-sided lumbar paraspinal  tenderness, diffuse without midline or point tenderness.  Neurological: Patient is alert and oriented x 3, no facial droop, clear speech, strength and sensation intact throughout.  Skin: Skin is warm and dry. No diaphoresis.  Psychiatric: Normal mood and affect. Normal behavior. Judgment and thought content normal.  Nursing note and vitals reviewed.   (all labs ordered are listed, but only abnormal results are displayed) Labs Reviewed  CBC WITH DIFFERENTIAL/PLATELET - Abnormal; Notable for the following components:      Result Value   WBC 3.8 (*)    RBC 2.89 (*)    Hemoglobin 9.7 (*)     HCT 29.7 (*)    MCV 102.8 (*)    All other components within normal limits  COMPREHENSIVE METABOLIC PANEL WITH GFR - Abnormal; Notable for the following components:   Sodium 134 (*)    Potassium 3.3 (*)    Chloride 96 (*)    Glucose, Bld 66 (*)    Anion gap 16 (*)    All other components within normal limits  PRO BRAIN NATRIURETIC PEPTIDE - Abnormal; Notable for the following components:   Pro Brain Natriuretic Peptide 12,607.0 (*)    All other components within normal limits  TROPONIN T, HIGH SENSITIVITY - Abnormal; Notable for the following components:   Troponin T High Sensitivity 125 (*)    All other components within normal limits  TROPONIN T, HIGH SENSITIVITY - Abnormal; Notable for the following components:   Troponin T High Sensitivity 105 (*)    All other components within normal limits    Radiology: DG Chest 1 View Result Date: 02/23/2024 CLINICAL DATA:  CHF. EXAM: CHEST  1 VIEW COMPARISON:  10/29/2021 FINDINGS: Stable cardiomegaly. Stable appearance of the left chest dual chamber cardiac pacemaker. Chronic scarring at the lung apices. Lungs are clear without pulmonary edema. IMPRESSION: 1. No acute cardiopulmonary disease. 2. Stable cardiomegaly. Electronically Signed   By: Juliene Balder M.D.   On: 02/23/2024 12:55    Procedures   Medications Ordered in the ED  LORazepam  (ATIVAN ) 2 MG/ML injection (  Not Given 02/23/24 1438)  dextrose  50 % solution 25 mL (25 mLs Intravenous Given 02/23/24 1240)  furosemide  (LASIX ) injection 60 mg (60 mg Intravenous Given 02/23/24 1238)  potassium chloride  SA (KLOR-CON  M) CR tablet 40 mEq (40 mEq Oral Given 02/23/24 1244)  potassium chloride  10 mEq in 100 mL IVPB (0 mEq Intravenous Stopped 02/23/24 1344)                                Medical Decision Making Amount and/or Complexity of Data Reviewed Labs: ordered. Radiology: ordered.  Risk Prescription drug management. Decision regarding hospitalization.  This patient presents to the ED with  chief complaint(s) of ankle swelling and weakness with pertinent past medical history of metastatic prostate cancer, possible bilateral renal cell carcinoma, A-fib not on blood thinner, pacemaker. The complaint involves an extensive differential diagnosis and also carries with it a high risk of complications and morbidity.    Additional history obtained from family daughter at bedside Derrick Long. I have also reviewed previous admission documents  The differential diagnosis includes CHF, DVT, cellulitis, dependent edema, nephrotic syndrome, worsening metastatic cancer.   Clinical Course as of 02/23/24 1541  Wed Feb 23, 2024  1127 Alerted of elevated troponin level at 125.  Have requested ECG. [AJ]  1127 Also note mild hypokalemia and will treat this with potassium repletion, slightly low glucose  level so we will give intravenous dextrose , chronic leukopenia and anemia seems stable. [AJ]  1128 EKG at 11:26 AM intermittently paced rhythm at 67 bpm noted to have T wave inversions in V4 through V6 and native rhythm that appear to be present in 2023. [AJ]    Clinical Course User Index [AJ] Carlen Fils Hima, MD     Treatment and Reassessment: Patient is feeling comfortable.  Results reviewed with patient and daughter Derrick Long as well as discussed with son on the phone.  We are all in agreement the patient should be admitted for decompensated heart failure, diuresis, possible cardiology consultation and repeat echocardiogram.  Patient is amenable to this.  Elevated troponin but curve is flat and patient has no chest pain so suspect this is due to heart failure rather than acute coronary syndrome and have deferred anticoagulation.  Discussed with hospitalist who will admit patient.  Consultation: - Consulted or discussed management/test interpretation with external professional: Medicine  Consideration for admission or further workup: Plan for admission  Social Determinants of health: None  Final  diagnoses:  Congestive heart failure, unspecified HF chronicity, unspecified heart failure type [facility])  NSTEMI (non-ST elevated myocardial infarction) [facility])    ED Discharge Orders     None          Fredia Rosette Kirsch, MD 02/23/24 1542  "

## 2024-02-23 NOTE — Hospital Course (Addendum)
 89 year old male with a history of metastatic prostate cancer to the adrenal gland, renal masses suspicious for multifocal RCC, symptomatic bradycardia status post PPM, hypertension, hyperlipidemia, CKD stage IIIa, chronic atrial fibrillation not on AC secondary to prior GI bleed and advanced age, major neurocognitive disorder, hyperlipidemia, B12 and vitamin D  deficiency, chronic back pain on tramadol  presenting with worsening lower extremity edema.  He was having some difficulty ambulating due to edema.  He visited the ED at the [facility] in [place] on 02/14/2024 with cough, generalized weakness and lower extremity edema.  He was diagnosed with pneumonia and prescribed Augmentin and doxycycline . Furosemide   increasded to 40 mg daily for 5 days then down to 20 mg daily. Report cough at that time.  Energy is better.   However since then, apparently his lower extremity edema had worsened again.  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 Recurrent melena 11/2023, no endoscopy, denies further episodes of  melena or abdominal pain since restarting PPI   Patient denies fevers, chills, headache, chest pain, dyspnea, nausea, vomiting, diarrhea, abdominal pain, dysuria, hematuria, hematochezia, and melena. He denies any worsening orthopnea or abdominal bloating.  He denies any chest pain or shortness of breath.  In the ED, the patient was afebrile and hemodynamically stable with oxygen saturation 97% room air. WBC 3.8, hemoglobin 9.5, platelet 251.  Sodium 134, potassium 3.3, bicarbonate 22, serum creatinine 0.99.  AST 17, ALT < 5, alk phosphatase 55, total bilirubin 1.1.  proBNP was 12,607.  Troponin 126>> 105. The patient was started on IV furosemide  with good clinical improvement.  He was transition to oral furosemide .  His renal function remained stable on furosemide  40 mg daily with which he was discharged home.  [facility] in 1 week

## 2024-02-23 NOTE — H&P (Addendum)
 " History and Physical    Patient: Derrick Long FMW:[id number] DOB: 03-Dec-1926 DOA: 02/23/2024 DOS: the patient was seen and examined on 02/24/2024 PCP: Estefana Doffing, MD  Patient coming from: Home  Chief Complaint:  Chief Complaint  Patient presents with   Leg Swelling   HPI: Derrick Long is a 89 year old male with a history of metastatic prostate cancer to the adrenal gland, symptomatic pericardia status post PPM, hypertension, hyperlipidemia, CKD stage IIIa, chronic atrial fibrillation not on AC secondary to prior GI bleed and advanced age, major neurocognitive disorder, hyperlipidemia, B12 and vitamin D  deficiency, chronic back pain on tramadol  presenting with worsening lower extremity edema.  He was having some difficulty ambulating due to edema.  He visited the ED at the [facility] in [place] on 02/14/2024 with cough, generalized weakness and lower extremity edema.  He was diagnosed with pneumonia and prescribed Augmentin and doxycycline . Furosemide   increasded to 40 mg daily for 5 days then down to 20 mg daily. Report cough at that time.  Energy is better.   However since then, apparently his lower extremity edema had worsened again.  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 Recurrent melena 11/2023, no endoscopy, denies further episodes of  melena or abdominal pain since restarting PPI   Patient denies fevers, chills, headache, chest pain, dyspnea, nausea, vomiting, diarrhea, abdominal pain, dysuria, hematuria, hematochezia, and melena. He denies any worsening orthopnea or abdominal bloating.  He denies any chest pain or shortness of breath.  In the ED, the patient was afebrile and hemodynamically stable with oxygen saturation 97% room air. WBC 3.8, hemoglobin 9.5, platelet 251.  Sodium 134, potassium 3.3, bicarbonate 22, serum creatinine 0.99.  AST 17, ALT < 5, alk phosphatase 55, total bilirubin 1.1.  proBNP was 12,607.  Troponin 126>>  105.  Review of Systems: As mentioned in the history of present illness. All other systems reviewed and are negative. Past Medical History:  Diagnosis Date   Acid reflux    Acute colitis 09/16/2019   AKI (acute kidney injury) 07/31/2012   Anemia    BENIGN PROSTATIC HYPERTROPHY, WITH OBSTRUCTION 12/18/2005   Qualifier: Diagnosis of  By: Olene Dodrill     Chest pain 07/31/2012   Colitis 09/16/2019   Colon cancer (HCC)    per patient, colon cancer more than 10 years ago s/p right hemicolectomy   Elevated bilirubin    Elevated LFTs    Ex-cigarette smoker    Gout    Hyperlipemia    Hypertension    Hyponatremia 08/01/2021   Intractable abdominal pain 02/03/2021   LATERAL MENISCUS TEAR, RIGHT 04/19/2006   Qualifier: Diagnosis of  By: Karren MD, Cornelius     Leukocytosis, unspecified 07/31/2012   Paroxysmal atrial fibrillation (HCC) 2014   Presence of permanent cardiac pacemaker    Prostate cancer (HCC) 08/08/2013   SEEDS 20 YEARS AGO   Prostate disease    RENAL CALCULUS 12/18/2005   Qualifier: Diagnosis of  By: Olene Dodrill     RUQ pain    UTI (urinary tract infection) 03/26/2021   Past Surgical History:  Procedure Laterality Date   BIOPSY  02/05/2021   Procedure: BIOPSY;  Surgeon: Eartha Angelia Sieving, MD;  Location: [facility];  Service: Gastroenterology;;   BIOPSY  08/02/2021   Procedure: BIOPSY;  Surgeon: Shaaron Lamar HERO, MD;  Location: [facility];  Service: Endoscopy;;   COLON SURGERY     Right hemicolectomy in the setting of colon  cancer, per patient   CYSTOSCOPY WITH INSERTION OF UROLIFT N/A 05/19/2021   Procedure: CYSTOSCOPY WITH INSERTION OF UROLIFT;  Surgeon: Sherrilee Belvie CROME, MD;  Location: [facility];  Service: Urology;  Laterality: N/A;   ENTEROSCOPY N/A 11/21/2021   Procedure: PUSH ENTEROSCOPY;  Surgeon: Eartha Angelia Sieving, MD;  Location: [facility];  Service: Gastroenterology;  Laterality: N/A;   ESOPHAGOGASTRODUODENOSCOPY (EGD) WITH  PROPOFOL  N/A 02/05/2021   Procedure: ESOPHAGOGASTRODUODENOSCOPY (EGD) WITH PROPOFOL ;  Surgeon: Eartha Angelia Sieving, MD;  Location: [facility];  Service: Gastroenterology;  Laterality: N/A;   ESOPHAGOGASTRODUODENOSCOPY (EGD) WITH PROPOFOL  N/A 08/02/2021   Procedure: ESOPHAGOGASTRODUODENOSCOPY (EGD) WITH PROPOFOL ;  Surgeon: Shaaron Lamar HERO, MD;  Location: [facility];  Service: Endoscopy;  Laterality: N/A;   ESOPHAGOGASTRODUODENOSCOPY (EGD) WITH PROPOFOL  N/A 11/21/2021   Procedure: ESOPHAGOGASTRODUODENOSCOPY (EGD) WITH PROPOFOL ;  Surgeon: Eartha Angelia Sieving, MD;  Location: [facility];  Service: Gastroenterology;  Laterality: N/A;  1200  ASA 3   HOT HEMOSTASIS  08/02/2021   Procedure: HOT HEMOSTASIS (ARGON PLASMA COAGULATION/BICAP);  Surgeon: Shaaron Lamar HERO, MD;  Location: [facility];  Service: Endoscopy;;   HOT HEMOSTASIS  11/21/2021   Procedure: HOT HEMOSTASIS (ARGON PLASMA COAGULATION/BICAP);  Surgeon: Eartha Angelia, Sieving, MD;  Location: [facility];  Service: Gastroenterology;;   LEFT HEART CATHETERIZATION WITH CORONARY ANGIOGRAM N/A 07/21/2012   Procedure: LEFT HEART CATHETERIZATION WITH CORONARY ANGIOGRAM;  Surgeon: Debby DELENA Sor, MD;  Location: [facility] CATH [facility];  Service: Cardiovascular;  Laterality: N/A;   PACEMAKER INSERTION     PERMANENT PACEMAKER INSERTION N/A 07/22/2012   Procedure: PERMANENT PACEMAKER INSERTION;  Surgeon: Jerel Balding, MD;  Location: [facility];  Service: Cardiovascular;  Laterality: N/A;   PROSTATE SURGERY     Social History:  reports that he quit smoking about 21 years ago. His smoking use included cigarettes. He has been exposed to tobacco smoke. He has quit using smokeless tobacco.  His smokeless tobacco use included chew. He reports that he does not drink alcohol and does not use drugs.  Allergies[1]  Family History  Problem Relation Age of Onset   Cancer Neg Hx     Prior to Admission medications  Medication Sig Start Date End  Date Taking? Authorizing Provider  lisinopril  (ZESTRIL ) 20 MG tablet Take 20 mg by mouth daily. for high blood pressure 02/04/24  Yes [provider]  acetaminophen  (TYLENOL ) 650 MG CR tablet Take 1,300 mg by mouth every 8 (eight) hours as needed for pain.    [provider]  carvedilol  (COREG ) 3.125 MG tablet Take 3.125 mg by mouth 2 (two) times daily with a meal.    [provider]  diclofenac  sodium (VOLTAREN ) 1 % GEL Apply 2 g topically 4 (four) times daily. Patient taking differently: Apply 2 g topically daily as needed (pain). 06/19/17   Rancour, Garnette, MD  Ensure (ENSURE) Take 237 mLs by mouth daily as needed (nutrition). Vanilla or chocolate    [provider]  ferrous sulfate  324 MG TBEC Take 324 mg by mouth. Takes one tablet every other day    [provider]  furosemide  (LASIX ) 20 MG tablet Take 10 mg by mouth daily. One half tablet daily 12/04/21   [provider]  pantoprazole  (PROTONIX ) 40 MG tablet TAKE 1 TABLET BY MOUTH ONCE DAILY. 08/02/23   Carlan, Chelsea L, NP  senna (SENOKOT) 8.6 MG TABS tablet Take 1 tablet (8.6 mg total) by mouth at bedtime. 12/10/23   Willette Adriana DELENA, MD  tamsulosin  (FLOMAX ) 0.4 MG CAPS capsule Take 1 capsule (0.4 mg total) by mouth 2 (two) times daily. 09/15/23   McKenzie, Belvie CROME, MD  traMADol  (ULTRAM ) 50 MG tablet Take 1 tablet (50 mg total) by mouth every 6 (six) hours as needed. 12/20/23   Geofm Delon BRAVO, NP    Physical Exam: Vitals:   02/23/24 1958 02/23/24 2300 02/24/24 0019 02/24/24 0420  BP: 139/67  (!) 151/64 (!) 153/54  Pulse: 69  79 66  Resp: 19   18  Temp: (!) 101.2 F (38.4 C) 98.4 F (36.9 C) 98.5 F (36.9 C) 99.2 F (37.3 C)  TempSrc: Oral Oral Oral Oral  SpO2: 95%  97% 98%  Weight:    63.8 kg  Height:       GENERAL:  A&O x 3, NAD, well developed, cooperative, follows commands HEENT: [place]/AT, No thrush, No icterus, No oral ulcers Neck:  No neck mass, No meningismus, soft,  supple CV: RRR, no S3, no S4, no rub, no JVD Lungs:  CTA, no wheeze, no rhonchi, good air movement Abd: soft/NT +BS, nondistended Ext: No edema, no lymphangitis, no cyanosis, no rashes Neuro:  CN II-XII intact, strength 4/5 in RUE, RLE, strength 4/5 LUE, LLE; sensation intact bilateral; no dysmetria; babinski equivocal  Data Reviewed: Data reviewed in above history  Assessment and Plan: Acute on chronic HFpEF - The patient does have some symptoms of fluid overload - proBNP 12,607 - Start IV furosemide  40 mg IV twice daily - Accurate I's and O's - 10/13/2021 echo EF 65 to 70% grade 1 DD, normal RVF, trivial MR, mild AS; ascending aorta  3.7cm -12/25/2023 Echo 01/04/2024 EF 50%, G2DD, normal RVF; ascending aorta 4.5 cm - Repeat limited echo - His weight at his recent geriatrician follow-up was 151.6 pounds - Urine protein creatinine ratio  CKD stage IIIa - Baseline creatinine 0.9-1.2 - Monitor with diuresis  Chronic atrial fibrillation - Not on AC secondary to GI bleed and advanced age - Rate controlled - Continue carvedilol   Elevated troponin - Not consistent with ACS - Troponin 126>> 105 - No chest pain presently  Metastatic prostate cancer -Metastasis 06/2020: R adrenal biopsy - metastatic prostatic adenocarcinoma -CT [facility] 12/05/2023--shows increasing size of adrenal metastasis from 1.9>>4.9cm - Treatment:  Degarelix  06/2020 x1 Eligard  (08/21/2020)Q6 months - current last dose 12/2023 Abiraterone  100 mg + prednisone  5 mg daily 01/2021 - 05/2021 Darolutamide  300 mg + prednisone  5mg  daily 06/2021 to 12/2022 [employer] and genetic testing- no targetable mutation Continued leuprolide  (Eligard ) injection Q6 months  - Last injection of leuprolide  12/28/2023 - Was seen by medical oncology last hospital admission 12/07/2023--not a candidate for further chemotherapy secondary to advanced age and comorbidities; can continue ADT  Chronic back pain - On chronic tramadol   Essential  hypertension - Holding lisinopril  temporarily - Continue carvedilol    BPH with LUTS -Status post UroLift 05/19/2021 -Continue tamsulosin  0.4 mg -[facility]    Advance Care Planning: DNR per last palliative consult 11/2023  Consults: none  Family Communication: none present  Severity of Illness: The appropriate patient status for this patient is INPATIENT. Inpatient status is judged to be reasonable and necessary in order to provide the required intensity of service to ensure the patient's safety. The patient's presenting symptoms, physical exam findings, and initial radiographic and laboratory data in the context of their chronic comorbidities is felt to place them at high risk for further clinical deterioration. Furthermore, it is not anticipated that the patient will be  medically stable for discharge from the hospital within 2 midnights of admission.   * I certify that at the point of admission it is my clinical judgment that the patient will require inpatient hospital care spanning beyond 2 midnights from the point of admission due to high intensity of service, high risk for further deterioration and high frequency of surveillance required.*  Author: Alm Schneider, MD 02/24/2024 6:15 AM  For on call review [email or web address].      [1] No Known Allergies  "

## 2024-02-23 NOTE — ED Triage Notes (Signed)
 Pt arrived via POV with family c/o bilateral lower extremity swelling and weakness X 2 weeks. Pt reports recently having medications adjusted by his PCP through the [facility] hospital and recently taking antibiotics for pneumonia.

## 2024-02-23 NOTE — ED Notes (Signed)
 See triage notes. Pt states the mild swelling in both legs are his baseline. He is concerned about the swelling on top of his feet and sometimes painful. Mild swelling noted. Pedal pulses weak bilateral. Just finished abs for PNA. Nad. Denies pain/shob at this time. Moving all extremities.

## 2024-02-24 ENCOUNTER — Inpatient Hospital Stay (HOSPITAL_COMMUNITY)

## 2024-02-24 DIAGNOSIS — I110 Hypertensive heart disease with heart failure: Secondary | ICD-10-CM

## 2024-02-24 DIAGNOSIS — I5031 Acute diastolic (congestive) heart failure: Secondary | ICD-10-CM

## 2024-02-24 DIAGNOSIS — R399 Unspecified symptoms and signs involving the genitourinary system: Secondary | ICD-10-CM | POA: Diagnosis not present

## 2024-02-24 DIAGNOSIS — I480 Paroxysmal atrial fibrillation: Secondary | ICD-10-CM | POA: Diagnosis not present

## 2024-02-24 DIAGNOSIS — Z515 Encounter for palliative care: Secondary | ICD-10-CM | POA: Diagnosis not present

## 2024-02-24 DIAGNOSIS — C61 Malignant neoplasm of prostate: Secondary | ICD-10-CM | POA: Diagnosis not present

## 2024-02-24 DIAGNOSIS — E43 Unspecified severe protein-calorie malnutrition: Secondary | ICD-10-CM | POA: Insufficient documentation

## 2024-02-24 DIAGNOSIS — Z950 Presence of cardiac pacemaker: Secondary | ICD-10-CM | POA: Diagnosis not present

## 2024-02-24 DIAGNOSIS — I5023 Acute on chronic systolic (congestive) heart failure: Secondary | ICD-10-CM | POA: Diagnosis not present

## 2024-02-24 LAB — RESPIRATORY PANEL BY PCR

## 2024-02-24 LAB — ECHOCARDIOGRAM LIMITED
Calc EF: 57.7 %
Height: 73 in
S' Lateral: 2.8 cm
Single Plane A2C EF: 52.1 %
Single Plane A4C EF: 54.6 %
Weight: 2250.46 [oz_av]

## 2024-02-24 LAB — BASIC METABOLIC PANEL WITH GFR
Anion gap: 10 (ref 5–15)
BUN: 12 mg/dL (ref 8–23)
CO2: 28 mmol/L (ref 22–32)
Calcium: 8.5 mg/dL — ABNORMAL LOW (ref 8.9–10.3)
Chloride: 97 mmol/L — ABNORMAL LOW (ref 98–111)
Creatinine, Ser: 1.01 mg/dL (ref 0.61–1.24)
GFR, Estimated: >60 mL/min
Glucose, Bld: 63 mg/dL — ABNORMAL LOW (ref 70–99)
Potassium: 3.4 mmol/L — ABNORMAL LOW (ref 3.5–5.1)
Sodium: 134 mmol/L — ABNORMAL LOW (ref 135–145)

## 2024-02-24 LAB — URIC ACID: Uric Acid, Serum: 7.1 mg/dL (ref 3.7–8.6)

## 2024-02-24 LAB — CBC
HCT: 25.7 % — ABNORMAL LOW (ref 39.0–52.0)
Hemoglobin: 8.6 g/dL — ABNORMAL LOW (ref 13.0–17.0)
MCH: 33.9 pg (ref 26.0–34.0)
MCHC: 33.5 g/dL (ref 30.0–36.0)
MCV: 101.2 fL — ABNORMAL HIGH (ref 80.0–100.0)
Platelets: 229 K/uL (ref 150–400)
RBC: 2.54 MIL/uL — ABNORMAL LOW (ref 4.22–5.81)
RDW: 12.8 % (ref 11.5–15.5)
WBC: 3.6 K/uL — ABNORMAL LOW (ref 4.0–10.5)
nRBC: 0 % (ref 0.0–0.2)

## 2024-02-24 LAB — T4, FREE: Free T4: 1.38 ng/dL (ref 0.80–2.00)

## 2024-02-24 LAB — RESP PANEL BY RT-PCR (RSV, FLU A&B, COVID)  RVPGX2
Influenza A by PCR: NEGATIVE
Influenza B by PCR: NEGATIVE
Resp Syncytial Virus by PCR: NEGATIVE
SARS Coronavirus 2 by RT PCR: NEGATIVE

## 2024-02-24 LAB — VITAMIN B12: Vitamin B-12: 564 pg/mL (ref 180–914)

## 2024-02-24 LAB — TSH: TSH: 5.02 u[IU]/mL — ABNORMAL HIGH (ref 0.350–4.500)

## 2024-02-24 LAB — MAGNESIUM: Magnesium: 1.8 mg/dL (ref 1.7–2.4)

## 2024-02-24 LAB — FOLATE: Folate: 6.1 ng/mL

## 2024-02-24 MED ORDER — PROCHLORPERAZINE EDISYLATE 10 MG/2ML IJ SOLN
5.0000 mg | Freq: Four times a day (QID) | INTRAMUSCULAR | Status: DC | PRN
  Administered 2024-02-24: 5 mg via INTRAVENOUS
  Filled 2024-02-24: qty 2

## 2024-02-24 MED ORDER — PREDNISONE 20 MG PO TABS
50.0000 mg | ORAL_TABLET | Freq: Once | ORAL | Status: AC
  Administered 2024-02-24: 50 mg via ORAL
  Filled 2024-02-24: qty 1

## 2024-02-24 MED ORDER — POTASSIUM CHLORIDE CRYS ER 20 MEQ PO TBCR
40.0000 meq | EXTENDED_RELEASE_TABLET | Freq: Once | ORAL | Status: AC
  Administered 2024-02-24: 40 meq via ORAL
  Filled 2024-02-24: qty 2

## 2024-02-24 MED ORDER — FERROUS SULFATE 325 (65 FE) MG PO TABS
325.0000 mg | ORAL_TABLET | Freq: Every day | ORAL | Status: DC
  Administered 2024-02-24 – 2024-02-25 (×2): 325 mg via ORAL
  Filled 2024-02-24: qty 1

## 2024-02-24 MED ORDER — FUROSEMIDE 40 MG PO TABS
40.0000 mg | ORAL_TABLET | Freq: Every day | ORAL | Status: DC
  Administered 2024-02-24 – 2024-02-25 (×2): 40 mg via ORAL
  Filled 2024-02-24 (×2): qty 1

## 2024-02-24 MED ORDER — ENSURE PLUS HIGH PROTEIN PO LIQD
237.0000 mL | Freq: Three times a day (TID) | ORAL | Status: DC
  Administered 2024-02-24 – 2024-02-25 (×2): 237 mL via ORAL

## 2024-02-24 MED ORDER — MAGNESIUM SULFATE 2 GM/50ML IV SOLN
2.0000 g | Freq: Once | INTRAVENOUS | Status: AC
  Administered 2024-02-24: 2 g via INTRAVENOUS
  Filled 2024-02-24: qty 50

## 2024-02-24 MED ORDER — PANTOPRAZOLE SODIUM 40 MG IV SOLR
40.0000 mg | Freq: Two times a day (BID) | INTRAVENOUS | Status: DC
  Administered 2024-02-24 – 2024-02-25 (×2): 40 mg via INTRAVENOUS
  Filled 2024-02-24 (×2): qty 10

## 2024-02-24 MED ORDER — ADULT MULTIVITAMIN W/MINERALS CH
1.0000 | ORAL_TABLET | Freq: Every day | ORAL | Status: DC
  Administered 2024-02-24 – 2024-02-25 (×2): 1 via ORAL
  Filled 2024-02-24 (×2): qty 1

## 2024-02-24 NOTE — Plan of Care (Signed)

## 2024-02-24 NOTE — Consult Note (Signed)
 "                                                                                   Consultation Note Date: 02/24/2024   Patient Name: Derrick Long  DOB: May 03, 1926  MRN: [id number]  Age / Sex: 89 y.o., male  PCP: Estefana Doffing, MD Referring Physician: Evonnie Lenis, MD  Reason for Consultation: Establishing goals of care  HPI/Patient Profile: 89 y.o. male  with past medical history of metastatic prostate cancer to the adrenal gland (Eligard  every 6 months last 12/28/23), renal masses suspicious for renal cell carcinoma, symptomatic bradycardia s/p PPM, HTN, HLD, HFpEF, CKD stage 3a, chronic atrial fibrillation not on anticoagulation d/t prior GIB, major neurocognitive disorder, B12 and vitamin D  deficiencies, chronic back pain on tramadol  admitted on 02/23/2024 with worsening lower extremity edema found to have acute on chronic heart failure. Recently prescribed antibiotics for pneumonia as outpatient as well.   Clinical Assessment and Goals of Care: Consult received and chart review completed. I reviewed previous palliative notes and visits in Oct 2025. Noted plans for treatment last ADT in Nov 2025. I met with Derrick Long who slept throughout my visit. His daughter Derrick Long (who he lives with) and son at bedside. They recall discussions with palliative care previously. We discussed how everything has been going since this last discussion. They report ups and downs but overall okay. They report appetite fluctates and he has been more weak especially with lower extremity fluid. He sleeps often throughout the day.   Family are still in a place of hopes for some level of improvement and treating what we can. They understand that he has significant disease burden at advanced age. They share that they are trusting in their faith and God and have acceptance that he will go when it is his time. Time for outcomes. They have no specific concerns at this time but just time to see how he progresses. Ongoing  palliative support recommended.   All questions.concerns addressed. Emotional support provided.   Primary Decision Maker NEXT OF KIN adult children; patient can likely contribute to wishes at times    SUMMARY OF RECOMMENDATIONS   - DNR - Time for outcomes  Code Status/Advance Care Planning: DNR   Symptom Management:  Per attending  Prognosis:  Poor  Discharge Planning: To Be Determined      Primary Diagnoses: Present on Admission:  Acute on chronic HFrEF (heart failure with reduced ejection fraction) (HCC)  Prostate cancer (HCC)  Paroxysmal atrial fibrillation (HCC)  Pacemaker  Lower urinary tract symptoms (LUTS)  Essential hypertension   I have reviewed the medical record, interviewed the patient and family, and examined the patient. The following aspects are pertinent.  Past Medical History:  Diagnosis Date   Acid reflux    Acute colitis 09/16/2019   AKI (acute kidney injury) 07/31/2012   Anemia    BENIGN PROSTATIC HYPERTROPHY, WITH OBSTRUCTION 12/18/2005   Qualifier: Diagnosis of  By: Olene Dodrill     Chest pain 07/31/2012   Colitis 09/16/2019   Colon cancer (HCC)    per patient, colon cancer more than 10 years ago s/p right hemicolectomy   Elevated bilirubin  Elevated LFTs    Ex-cigarette smoker    Gout    Hyperlipemia    Hypertension    Hyponatremia 08/01/2021   Intractable abdominal pain 02/03/2021   LATERAL MENISCUS TEAR, RIGHT 04/19/2006   Qualifier: Diagnosis of  By: Karren MD, Cornelius     Leukocytosis, unspecified 07/31/2012   Paroxysmal atrial fibrillation (HCC) 2014   Presence of permanent cardiac pacemaker    Prostate cancer (HCC) 08/08/2013   SEEDS 20 YEARS AGO   Prostate disease    RENAL CALCULUS 12/18/2005   Qualifier: Diagnosis of  By: Olene Dodrill     RUQ pain    UTI (urinary tract infection) 03/26/2021   Social History   Socioeconomic History   Marital status: Widowed    Spouse name: Not on file   Number of  children: Not on file   Years of education: Not on file   Highest education level: Not on file  Occupational History   Not on file  Tobacco Use   Smoking status: Former    Current packs/day: 0.00    Types: Cigarettes    Quit date: 07/20/2002    Years since quitting: 21.6    Passive exposure: Past   Smokeless tobacco: Former    Types: Engineer, Drilling   Vaping status: Never Used  Substance and Sexual Activity   Alcohol use: No   Drug use: No   Sexual activity: Not Currently  Other Topics Concern   Not on file  Social History Narrative   Not on file   Social Drivers of Health   Tobacco Use: Medium Risk (02/23/2024)   Patient History    Smoking Tobacco Use: Former    Smokeless Tobacco Use: Former    Passive Exposure: Past  Programmer, Applications: Not on Ship Broker Insecurity: No Food Insecurity (02/23/2024)   Epic    Worried About Programme Researcher, Broadcasting/film/video in the Last Year: Never true    Ran Out of Food in the Last Year: Never true  Transportation Needs: No Transportation Needs (02/23/2024)   Epic    Lack of Transportation (Medical): No    Lack of Transportation (Non-Medical): No  Physical Activity: Not on file  Stress: Not on file  Social Connections: Moderately Isolated (02/23/2024)   Social Connection and Isolation Panel    Frequency of Communication with Friends and Family: More than three times a week    Frequency of Social Gatherings with Friends and Family: More than three times a week    Attends Religious Services: More than 4 times per year    Active Member of [employer] or Organizations: No    Attends Banker Meetings: Never    Marital Status: Widowed  Depression (PHQ2-9): Low Risk (12/20/2023)   Depression (PHQ2-9)    PHQ-2 Score: 0  Alcohol Screen: Not on file  Housing: Low Risk (02/23/2024)   Epic    Unable to Pay for Housing in the Last Year: No    Number of Times Moved in the Last Year: 0    Homeless in the Last Year: No  Utilities: Not At Risk  (02/23/2024)   Epic    Threatened with loss of utilities: No  Health Literacy: Not on file   Family History  Problem Relation Age of Onset   Cancer Neg Hx    Scheduled Meds:  carvedilol   3.125 mg Oral BID WC   feeding supplement  237 mL Oral TID BM   ferrous sulfate   325 mg  Oral Q breakfast   furosemide   40 mg Oral Daily   heparin   5,000 Units Subcutaneous Q8H   melatonin  6 mg Oral QHS   multivitamin with minerals  1 tablet Oral Daily   pantoprazole  (PROTONIX ) IV  40 mg Intravenous Q12H   predniSONE   50 mg Oral Once   senna  1 tablet Oral QHS   sodium chloride  flush  3 mL Intravenous Q12H   tamsulosin   0.4 mg Oral BID   Continuous Infusions: PRN Meds:.acetaminophen , prochlorperazine , sodium chloride  flush, traMADol  Allergies[1] Review of Systems  Unable to perform ROS: Acuity of condition  Constitutional:        Sleeping    Physical Exam Vitals and nursing note reviewed.  Constitutional:      General: He is sleeping.     Comments: Thin, frail, elderly  Cardiovascular:     Rate and Rhythm: Normal rate.  Pulmonary:     Effort: No tachypnea, accessory muscle usage or respiratory distress.  Musculoskeletal:     Right lower leg: Edema present.     Left lower leg: Edema present.  Neurological:     Comments: Mostly oriented but noted to be forgetful - sleeping at time of my visit     Vital Signs: BP 134/61 (BP Location: Right Arm)   Pulse 73   Temp 98.3 F (36.8 C) (Oral)   Resp 18   Ht 6' 1 (1.854 m)   Wt 63.8 kg   SpO2 97%   BMI 18.56 kg/m  Pain Scale: 0-10   Pain Score: 2    SpO2: SpO2: 97 % O2 Device:SpO2: 97 % O2 Flow Rate: .   IO: Intake/output summary:  Intake/Output Summary (Last 24 hours) at 02/24/2024 1601 Last data filed at 02/24/2024 0930 Gross per 24 hour  Intake 480 ml  Output 300 ml  Net 180 ml    LBM: Last BM Date : 02/24/24 Baseline Weight: Weight: 79.2 kg Most recent weight: Weight: 63.8 kg       Time Total: 55 min  Greater  than 50%  of this time was spent counseling and coordinating care related to the above assessment and plan.  Signed by: Bernarda Kitty, NP Palliative Medicine Team Pager # [phone] (M-F 8a-5p) Team Phone # [phone] (Nights/Weekends)                     [1] No Known Allergies  "

## 2024-02-24 NOTE — Progress Notes (Signed)
*  PRELIMINARY RESULTS* Echocardiogram Limited 2-D Echocardiogram has been performed.  Teresa Aida PARAS 02/24/2024, 9:09 AM

## 2024-02-24 NOTE — Progress Notes (Signed)
 Initial Nutrition Assessment  DOCUMENTATION CODES:  Severe malnutrition in context of chronic illness  INTERVENTION:  Liberalize diet to regular. Ensure Plus High Protein po TID, each supplement provides 350 kcal and 20 grams of protein. Magic cup TID with meals, each supplement provides 290 kcal and 9 grams of protein.  NUTRITION DIAGNOSIS:  Severe Malnutrition related to chronic illness (metastatic prostate cancer) as evidenced by severe muscle depletion, severe fat depletion, percent weight loss (19% weight loss within 6 months).  GOAL:  Patient will meet greater than or equal to 90% of their needs  MONITOR:  PO intake, Supplement acceptance  REASON FOR ASSESSMENT:  Rounds, Malnutrition Screening Tool    ASSESSMENT:  89 yo male admitted with foot swelling, acute on chronic HF. PMH includes metastatic prostate cancer, HTN, acid reflux, HLD, gout, anemia, PAF, remote colon cancer, pacemaker.  Spoke with patient and 2 of his daughters at bedside. He lives with one of his daughters and typically eats well. He eats eggs, bacon, or oatmeal for breakfast, snacks throughout the day, and has a sandwich or hot meal for dinner. He drinks orange juice, coffee, soft drinks, and water  throughout the day. Over the past 3 weeks he c/o poor appetite. He likes Ensure supplements, vanilla flavor and agreed to try magic cups.  Patient meets criteria for severe malnutrition, given severe depletion of muscle and subcutaneous fat mass with 19% weight loss within 6 months.  Usual weight: 175 lbs per patient, 79.2 kg (11/11/23) Current weight: 63.8 kg 19% weight loss within 6 months is severe.  Average Meal Intake: 50-70% intake x 2 recorded meals  Nutritionally Relevant Medications: Scheduled Meds:  carvedilol   3.125 mg Oral BID WC   ferrous sulfate   325 mg Oral Q breakfast   furosemide   40 mg Oral Daily   heparin   5,000 Units Subcutaneous Q8H   melatonin  6 mg Oral QHS   pantoprazole   (PROTONIX ) IV  40 mg Intravenous Q12H   senna  1 tablet Oral QHS   sodium chloride  flush  3 mL Intravenous Q12H   tamsulosin   0.4 mg Oral BID   Continuous Infusions:  sodium chloride      PRN Meds:.sodium chloride , acetaminophen , prochlorperazine , sodium chloride  flush, traMADol   Labs Reviewed: Na 134 K 3.4  NUTRITION - FOCUSED PHYSICAL EXAM: Flowsheet Row Most Recent Value  Orbital Region Severe depletion  Upper Arm Region Severe depletion  Thoracic and Lumbar Region Moderate depletion  Buccal Region Severe depletion  Temple Region Severe depletion  Clavicle Bone Region Severe depletion  Clavicle and Acromion Bone Region Moderate depletion  Scapular Bone Region Severe depletion  Dorsal Hand Moderate depletion  Patellar Region Moderate depletion  Anterior Thigh Region Moderate depletion  Posterior Calf Region Moderate depletion  Edema (RD Assessment) Mild  Hair Reviewed  Eyes Reviewed  Mouth Reviewed  Skin Reviewed  Nails Reviewed    Diet Order:   Diet Order             Diet Heart Room service appropriate? Yes; Fluid consistency: Thin  Diet effective now                   EDUCATION NEEDS:  Education needs have been addressed  Skin:  Skin Assessment: Reviewed RN Assessment  Last BM:  1/7  Height:  Ht Readings from Last 1 Encounters:  02/23/24 6' 1 (1.854 m)   Weight:  Wt Readings from Last 1 Encounters:  02/24/24 63.8 kg   Ideal Body Weight:  83.6 kg  BMI:  Body mass index is 18.56 kg/m.  Estimated Nutritional Needs:  Kcal:  1800-2000 Protein:  85-95 gm Fluid:  1.8-2 L   Suzen HUNT RD, LDN, CNSC Contact via secure chat. If unavailable, use group chat RD Inpatient.

## 2024-02-24 NOTE — Discharge Instructions (Signed)
 Derrick Long

## 2024-02-24 NOTE — Progress Notes (Signed)
 "          PROGRESS NOTE  Derrick Long FMW:[id number] DOB: May 23, 1926 DOA: 02/23/2024 PCP: Estefana Doffing, MD  Brief History:  89 year old male with a history of metastatic prostate cancer to the adrenal gland, renal masses suspicious for multifocal RCC, symptomatic bradycardia status post PPM, hypertension, hyperlipidemia, CKD stage IIIa, chronic atrial fibrillation not on AC secondary to prior GI bleed and advanced age, major neurocognitive disorder, hyperlipidemia, B12 and vitamin D  deficiency, chronic back pain on tramadol  presenting with worsening lower extremity edema.  He was having some difficulty ambulating due to edema.  He visited the ED at the [facility] in [place] on 02/14/2024 with cough, generalized weakness and lower extremity edema.  He was diagnosed with pneumonia and prescribed Augmentin and doxycycline . Furosemide   increasded to 40 mg daily for 5 days then down to 20 mg daily. Report cough at that time.  Energy is better.   However since then, apparently his lower extremity edema had worsened again.  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 Recurrent melena 11/2023, no endoscopy, denies further episodes of  melena or abdominal pain since restarting PPI   Patient denies fevers, chills, headache, chest pain, dyspnea, nausea, vomiting, diarrhea, abdominal pain, dysuria, hematuria, hematochezia, and melena. He denies any worsening orthopnea or abdominal bloating.  He denies any chest pain or shortness of breath.  In the ED, the patient was afebrile and hemodynamically stable with oxygen saturation 97% room air. WBC 3.8, hemoglobin 9.5, platelet 251.  Sodium 134, potassium 3.3, bicarbonate 22, serum creatinine 0.99.  AST 17, ALT < 5, alk phosphatase 55, total bilirubin 1.1.  proBNP was 12,607.  Troponin 126>> 105.   Assessment/Plan:  Acute on chronic HFpEF - The patient does have some symptoms of fluid overload - proBNP 12,607 - Start  IV furosemide  40 mg IV twice daily - Accurate I's and O's - 10/13/2021 echo EF 65 to 70% grade 1 DD, normal RVF, trivial MR, mild AS; ascending aorta  3.7cm -12/25/2023 Echo 01/04/2024 EF 50%, G2DD, normal RVF; ascending aorta 4.5 cm - Repeat limited echo - His weight at his recent geriatrician follow-up was 151.6 pounds - Urine protein creatinine ratio--0.4 - ultrasound legs--no DVT  Fever -02/23/24--up to 101.2 -blood cultures x 2 -UA--no pyuria -CXR--personally reviewed--no infiltrates, no edema -check COVID/Flu/RSV -viral respiratory panel -uric acid   CKD stage IIIa - Baseline creatinine 0.9-1.2 - Monitor with diuresis  Acute gout -tender/warm on right first MTPJ -start prednisone    Chronic atrial fibrillation - Not on AC secondary to GI bleed and advanced age - Rate controlled - Continue carvedilol    Elevated troponin - Not consistent with ACS - Troponin 126>> 105 - No chest pain presently   Metastatic prostate cancer -Metastasis 06/2020: R adrenal biopsy - metastatic prostatic adenocarcinoma -CT AP 12/05/2023--shows increasing size of adrenal metastasis from 1.9>>4.9cm - Treatment:  Degarelix  06/2020 x1 Eligard  (08/21/2020)Q6 months - current last dose 12/2023 Abiraterone  100 mg + prednisone  5 mg daily 01/2021 - 05/2021 Darolutamide  300 mg + prednisone  5mg  daily 06/2021 to 12/2022 [employer] and genetic testing- no targetable mutation Continued leuprolide  (Eligard ) injection Q6 months  - Last injection of leuprolide  12/28/2023 - Was seen by medical oncology last hospital admission 12/07/2023--not a candidate for further chemotherapy secondary to advanced age and comorbidities; can continue ADT   Chronic back pain - On chronic tramadol    Essential hypertension - Holding lisinopril  temporarily - Continue carvedilol      BPH with  LUTS -Status post UroLift 05/19/2021 -Continue tamsulosin  0.4 mg -[facility]      Family Communication:    daughter at bedside 1/8  Consultants:  none  Code Status:  FULL   DVT Prophylaxis:  C[place]ke Heparin    Procedures: As Listed in Progress Note Above  Antibiotics: None     Subjective:  He has dry cough.  Denies cp, sob, n/v/d, abd pain, headache Objective: Vitals:   02/23/24 1958 02/23/24 2300 02/24/24 0019 02/24/24 0420  BP: 139/67  (!) 151/64 (!) 153/54  Pulse: 69  79 66  Resp: 19   18  Temp: (!) 101.2 F (38.4 C) 98.4 F (36.9 C) 98.5 F (36.9 C) 99.2 F (37.3 C)  TempSrc: Oral Oral Oral Oral  SpO2: 95%  97% 98%  Weight:    63.8 kg  Height:        Intake/Output Summary (Last 24 hours) at 02/24/2024 9261 Last data filed at 02/23/2024 2000 Gross per 24 hour  Intake 240 ml  Output 300 ml  Net -60 ml   Weight change:  Exam:  General:  Pt is alert, follows commands appropriately, not in acute distress HEENT: No icterus, No thrush, No neck mass, [place]/AT Cardiovascular: RRR, S1/S2, no rubs, no gallops Respiratory: fine bibasilar crackles.  No wheeze Abdomen: Soft/+BS, non tender, non distended, no guarding Extremities: trace LE edema, No lymphangitis, No petechiae, No rashes, no synovitis;  tender and wam R-first MTPJ   Data Reviewed: I have personally reviewed following labs and imaging studies Basic Metabolic Panel: Recent Labs  Lab 02/23/24 1027 02/24/24 0417  NA 134* 134*  K 3.3* 3.4*  CL 96* 97*  CO2 22 28  GLUCOSE 66* 63*  BUN 11 12  CREATININE 0.99 1.01  CALCIUM  9.0 8.5*  MG  --  1.8   Liver Function Tests: Recent Labs  Lab 02/23/24 1027  AST 17  ALT <5  ALKPHOS 55  BILITOT 1.1  PROT 7.0  ALBUMIN 3.5   No results for input(s): LIPASE, AMYLASE in the last 168 hours. No results for input(s): AMMONIA in the last 168 hours. Coagulation Profile: No results for input(s): INR, PROTIME in the last 168 hours. CBC: Recent Labs  Lab 02/23/24 1027 02/24/24 0635  WBC 3.8* 3.6*  NEUTROABS 1.7  --   HGB 9.7* 8.6*  HCT 29.7* 25.7*  MCV  102.8* 101.2*  PLT 251 229   Cardiac Enzymes: No results for input(s): CKTOTAL, CKMB, CKMBINDEX, TROPONINI in the last 168 hours. BNP: Invalid input(s): POCBNP CBG: No results for input(s): GLUCAP in the last 168 hours. HbA1C: No results for input(s): HGBA1C in the last 72 hours. Urine analysis:    Component Value Date/Time   COLORURINE STRAW (A) 02/23/2024 1730   APPEARANCEUR CLEAR 02/23/2024 1730   APPEARANCEUR Clear 11/15/2023 1511   LABSPEC 1.004 (L) 02/23/2024 1730   PHURINE 6.0 02/23/2024 1730   GLUCOSEU NEGATIVE 02/23/2024 1730   HGBUR SMALL (A) 02/23/2024 1730   HGBUR negative 06/24/2007 0833   BILIRUBINUR NEGATIVE 02/23/2024 1730   BILIRUBINUR Negative 11/15/2023 1511   KETONESUR NEGATIVE 02/23/2024 1730   PROTEINUR NEGATIVE 02/23/2024 1730   UROBILINOGEN 1.0 07/29/2013 2032   NITRITE NEGATIVE 02/23/2024 1730   LEUKOCYTESUR NEGATIVE 02/23/2024 1730   Sepsis Labs: @LABRCNTIP (procalcitonin:4,lacticidven:4) )No results found for this or any previous visit (from the past 240 hours).   Scheduled Meds:  carvedilol   3.125 mg Oral BID WC   ferrous sulfate   324 mg Oral Q breakfast  furosemide   40 mg Intravenous BID   heparin   5,000 Units Subcutaneous Q8H   melatonin  6 mg Oral QHS   pantoprazole   40 mg Oral Daily   senna  1 tablet Oral QHS   sodium chloride  flush  3 mL Intravenous Q12H   tamsulosin   0.4 mg Oral BID   Continuous Infusions:  sodium chloride       Procedures/Studies: US  Venous Img Lower Bilateral (DVT) Result Date: 02/23/2024 CLINICAL DATA:  Bilateral lower extremity edema. EXAM: BILATERAL LOWER EXTREMITY VENOUS DOPPLER ULTRASOUND TECHNIQUE: Gray-scale sonography with graded compression, as well as color Doppler and duplex ultrasound were performed to evaluate the lower extremity deep venous systems from the level of the common femoral vein and including the common femoral, femoral, profunda femoral, popliteal and calf veins including the  posterior tibial, peroneal and gastrocnemius veins when visible. The superficial great saphenous vein was also interrogated. Spectral Doppler was utilized to evaluate flow at rest and with distal augmentation maneuvers in the common femoral, femoral and popliteal veins. COMPARISON:  None Available. FINDINGS: RIGHT LOWER EXTREMITY Common Femoral Vein: No evidence of thrombus. Normal compressibility, respiratory phasicity and response to augmentation. Saphenofemoral Junction: No evidence of thrombus. Normal compressibility and flow on color Doppler imaging. Profunda Femoral Vein: No evidence of thrombus. Normal compressibility and flow on color Doppler imaging. Femoral Vein: No evidence of thrombus. Normal compressibility, respiratory phasicity and response to augmentation. Popliteal Vein: No evidence of thrombus. Normal compressibility, respiratory phasicity and response to augmentation. Calf Veins: No evidence of thrombus. Normal compressibility and flow on color Doppler imaging. Superficial Great Saphenous Vein: No evidence of thrombus. Normal compressibility. Venous Reflux:  None. Other Findings: No evidence of superficial thrombophlebitis or abnormal fluid collection. LEFT LOWER EXTREMITY Common Femoral Vein: No evidence of thrombus. Normal compressibility, respiratory phasicity and response to augmentation. Saphenofemoral Junction: No evidence of thrombus. Normal compressibility and flow on color Doppler imaging. Profunda Femoral Vein: No evidence of thrombus. Normal compressibility and flow on color Doppler imaging. Femoral Vein: No evidence of thrombus. Normal compressibility, respiratory phasicity and response to augmentation. Popliteal Vein: No evidence of thrombus. Normal compressibility, respiratory phasicity and response to augmentation. Calf Veins: No evidence of thrombus. Normal compressibility and flow on color Doppler imaging. Superficial Great Saphenous Vein: No evidence of thrombus. Normal  compressibility. Venous Reflux:  None. Other Findings: No evidence of superficial thrombophlebitis. Small Baker's cyst of the left popliteal fossa measures approximately 2.8 x 2.3 x 1.3 cm. IMPRESSION: 1. No evidence of deep venous thrombosis in either lower extremity. 2. Small Baker's cyst of the left popliteal fossa. Electronically Signed   By: Marcey Moan M.D.   On: 02/23/2024 17:46   DG Chest 1 View Result Date: 02/23/2024 CLINICAL DATA:  CHF. EXAM: CHEST  1 VIEW COMPARISON:  10/29/2021 FINDINGS: Stable cardiomegaly. Stable appearance of the left chest dual chamber cardiac pacemaker. Chronic scarring at the lung apices. Lungs are clear without pulmonary edema. IMPRESSION: 1. No acute cardiopulmonary disease. 2. Stable cardiomegaly. Electronically Signed   By: Juliene Balder M.D.   On: 02/23/2024 12:55    Alm Schneider, DO  [facility]  If 7PM-7AM, please contact night-coverage [email or web address] Password [email or web address] 02/24/2024, 7:38 AM   LOS: 1 day   "

## 2024-02-24 NOTE — TOC Initial Note (Signed)
 Transition of Care [facility]) - Initial/Assessment Note    Patient Details  Name: Derrick Long MRN: [id number] Date of Birth: 12/17/1926  Transition of Care [facility]) CM/SW Contact:    Derrick Long, LCSWA Phone Number: 02/24/2024, 11:17 AM  Clinical Narrative:                 Pt is high risk for readmission. CSW spoke with pts daughter to complete assessment. Pt lives with daughter. Pt is independent in completing his ADLs. Pt is able to drive locally and states family can drive if needed. Pt had HH with [facility], CSW spoke to Derrick Long who states pt was closed on 1/3 with PT/OT services. Pt has a cane and walker to use if needed. TOC to follow.   Expected Discharge Plan: Home/Self Care Barriers to Discharge: Continued Medical Work up   Patient Goals and CMS Choice Patient states their goals for this hospitalization and ongoing recovery are:: return home CMS Medicare.gov Compare Post Acute Care list provided to:: Patient Represenative (must comment) Choice offered to / list presented to : Adult Children      Expected Discharge Plan and Services In-house Referral: Clinical Social Work Discharge Planning Services: CM Consult   Living arrangements for the past 2 months: Single Family Home                                      Prior Living Arrangements/Services Living arrangements for the past 2 months: Single Family Home Lives with:: Adult Children Patient language and need for interpreter reviewed:: Yes Do you feel safe going back to the place where you live?: Yes      Need for Family Participation in Patient Care: Yes (Comment) Care giver support system in place?: Yes (comment) Current home services: DME (cane and rolling walker) Criminal Activity/Legal Involvement Pertinent to Current Situation/Hospitalization: No - Comment as needed  Activities of Daily Living   ADL Screening (condition at time of admission) Independently performs ADLs?: No Does the patient have a  NEW difficulty with bathing/dressing/toileting/self-feeding that is expected to last >3 days?: No Does the patient have a NEW difficulty with getting in/out of bed, walking, or climbing stairs that is expected to last >3 days?: No Does the patient have a NEW difficulty with communication that is expected to last >3 days?: No Is the patient deaf or have difficulty hearing?: No Does the patient have difficulty seeing, even when wearing glasses/contacts?: No Does the patient have difficulty concentrating, remembering, or making decisions?: No  Permission Sought/Granted                  Emotional Assessment Appearance:: Appears stated age     Orientation: : Oriented to Self, Oriented to Place, Oriented to Situation Alcohol / Substance Use: Not Applicable Psych Involvement: No (comment)  Admission diagnosis:  NSTEMI (non-ST elevated myocardial infarction) (HCC) [I21.4] Acute on chronic HFrEF (heart failure with reduced ejection fraction) (HCC) [I50.23] Congestive heart failure, unspecified HF chronicity, unspecified heart failure type [facility]) [I50.9] Patient Active Problem List   Diagnosis Date Noted   Acute on chronic HFrEF (heart failure with reduced ejection fraction) (HCC) 02/23/2024   Acute on chronic heart failure with preserved ejection fraction (HFpEF) (HCC) 02/23/2024   Leukopenia 12/06/2023   Generalized weakness 12/06/2023   Prolonged QT interval 12/06/2023   Anemia of chronic disease 12/06/2023   Atrial fibrillation, chronic (HCC) 12/06/2023   Urinary  retention 12/06/2023   Hyperkalemia 12/05/2023   Genetic testing 01/11/2023   History of kidney stones 10/07/2022   Lower urinary tract symptoms (LUTS) 10/07/2022   Pain in left shoulder 03/06/2022   Angiectasia 01/19/2022   Angiodysplasia of stomach 01/19/2022   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   Melena 10/16/2021   GI bleed 08/01/2021   Hyponatremia 08/01/2021   Macrocytic anemia 08/01/2021   Mixed  hyperlipidemia 08/01/2021   Acute kidney injury superimposed on stage 4 chronic kidney disease (HCC) 03/27/2021   DNR (do not resuscitate) 03/26/2021   CKD (chronic kidney disease), stage III (HCC) 03/26/2021   Prostate cancer (HCC) 07/10/2020   Bilateral kidney masses 06/06/2020   Adrenal mass, right 06/06/2020   Abdominal pain 09/16/2019   Pelvic lymphadenopathy 09/16/2019   Paroxysmal atrial fibrillation (HCC) 09/02/2012   HTN (hypertension) 09/02/2012   Pacemaker 08/03/2012   Gout 07/31/2012   First degree AV block, PR interval 370 ms 07/19/2012   Chest pain, atypical 07/19/2012   Syncope 07/19/2012   Bradycardia 07/19/2012   Iron deficiency anemia due to chronic blood loss 08/16/2008   OSTEOARTHRITIS, MODERATE 09/21/2007   INSOMNIA 06/24/2007   DENTAL CARIES 10/08/2006   Migraine headache 12/18/2005   Essential hypertension 12/18/2005   GERD (gastroesophageal reflux disease) 12/18/2005   LOW BACK PAIN 12/18/2005   OSTEOPENIA 12/18/2005   COLON CANCER, HX OF 12/18/2005   PCP:  Derrick Doffing, MD Pharmacy:   [facility] [place], [place] - [address] [place] [address] Phone: [phone] Fax: [phone]  [facility] [place], [place] - [address] [place] [address] Phone: [phone] Fax: [phone]  Derrick Long - [facility] [address] [place] [address] Phone: [phone] Fax: [phone]     Social Drivers of Health (SDOH) Social History: SDOH Screenings   Food Insecurity: No Food Insecurity (02/23/2024)  Housing: Low Risk (02/23/2024)  Transportation Needs: No Transportation Needs (02/23/2024)  Utilities: Not At Risk (02/23/2024)  Depression (PHQ2-9): Low Risk (12/20/2023)  Social Connections: Moderately Isolated (02/23/2024)  Tobacco Use: Medium Risk (02/23/2024)   SDOH Interventions:     Readmission Risk Interventions    02/24/2024   11:03 AM 12/06/2023    12:15 PM 08/03/2021   11:19 AM  Readmission Risk Prevention Plan  Transportation Screening Complete Complete Complete  PCP or Specialist Appt within 3-5 Days  Complete   HRI or Home Care Consult Complete Complete   Social Work Consult for Recovery Care Planning/Counseling Complete Complete   Palliative Care Screening Not Applicable Not Applicable   Medication Review Oceanographer) Complete Complete Complete  PCP or Specialist appointment within 3-5 days of discharge   Complete  HRI or Home Care Consult   Complete  SW Recovery Care/Counseling Consult   Complete  Palliative Care Screening   Not Applicable  Skilled Nursing Facility   Not Applicable

## 2024-02-25 DIAGNOSIS — E43 Unspecified severe protein-calorie malnutrition: Secondary | ICD-10-CM

## 2024-02-25 DIAGNOSIS — I5023 Acute on chronic systolic (congestive) heart failure: Secondary | ICD-10-CM | POA: Diagnosis not present

## 2024-02-25 DIAGNOSIS — I480 Paroxysmal atrial fibrillation: Secondary | ICD-10-CM | POA: Diagnosis not present

## 2024-02-25 DIAGNOSIS — I5033 Acute on chronic diastolic (congestive) heart failure: Secondary | ICD-10-CM | POA: Diagnosis not present

## 2024-02-25 LAB — BASIC METABOLIC PANEL WITH GFR
Anion gap: 9 (ref 5–15)
BUN: 15 mg/dL (ref 8–23)
CO2: 27 mmol/L (ref 22–32)
Calcium: 8.6 mg/dL — ABNORMAL LOW (ref 8.9–10.3)
Chloride: 97 mmol/L — ABNORMAL LOW (ref 98–111)
Creatinine, Ser: 0.92 mg/dL (ref 0.61–1.24)
GFR, Estimated: >60 mL/min
Glucose, Bld: 137 mg/dL — ABNORMAL HIGH (ref 70–99)
Potassium: 3.8 mmol/L (ref 3.5–5.1)
Sodium: 133 mmol/L — ABNORMAL LOW (ref 135–145)

## 2024-02-25 LAB — MAGNESIUM: Magnesium: 2.2 mg/dL (ref 1.7–2.4)

## 2024-02-25 MED ORDER — PREDNISONE 50 MG PO TABS: 50.0000 mg | ORAL_TABLET | Freq: Every day | ORAL | 0 refills | Status: AC

## 2024-02-25 MED ORDER — LISINOPRIL 5 MG PO TABS: 5.0000 mg | ORAL_TABLET | Freq: Every day | ORAL | 1 refills | Status: AC

## 2024-02-25 MED ORDER — FUROSEMIDE 40 MG PO TABS: 40.0000 mg | ORAL_TABLET | Freq: Every day | ORAL | 1 refills | Status: AC

## 2024-02-25 MED ORDER — ALLOPURINOL 100 MG PO TABS: 100.0000 mg | ORAL_TABLET | Freq: Every day | ORAL | 1 refills | Status: AC

## 2024-02-25 MED ORDER — PREDNISONE 20 MG PO TABS
50.0000 mg | ORAL_TABLET | Freq: Every day | ORAL | Status: DC
  Administered 2024-02-25: 50 mg via ORAL
  Filled 2024-02-25: qty 1

## 2024-02-25 NOTE — Care Management Important Message (Signed)
 Important Message  Patient Details  Name: Derrick Long MRN: [id number] Date of Birth: Apr 25, 1926   Important Message Given:  N/A - LOS <3 / Initial given by admissions     Duwaine LITTIE Ada 02/25/2024, 3:36 PM

## 2024-02-25 NOTE — TOC Transition Note (Signed)
 Transition of Care [facility]) - Discharge Note   Patient Details  Name: Derrick Long MRN: [id number] Date of Birth: July 21, 1926  Transition [facility]) CM/SW Contact:  Lucie Lunger, LCSWA Phone Number: 02/25/2024, 3:36 PM   Clinical Narrative:    CSW spoke to Artavia with [facility] who states pt will need new auth from [facility] for [facility] PT to resume. CSW faxed [facility] request for and clinicals to [facility] for review. Artavia updated at this time, [facility] agency will follow up with pt and family in community. TOC signing off.     Barriers to Discharge: Continued Medical Work up   Patient Goals and CMS Choice Patient states their goals for this hospitalization and ongoing recovery are:: return home CMS Medicare.gov Compare Post Acute Care list provided to:: Patient Represenative (must comment) Choice offered to / list presented to : Adult Children      Discharge Placement                       Discharge Plan and Services Additional resources added to the After Visit Summary for   In-house Referral: Clinical Social Work Discharge Planning Services: CM Consult                                 Social Drivers of Health (SDOH) Interventions SDOH Screenings   Food Insecurity: No Food Insecurity (02/23/2024)  Housing: Low Risk (02/23/2024)  Transportation Needs: No Transportation Needs (02/23/2024)  Utilities: Not At Risk (02/23/2024)  Depression (PHQ2-9): Low Risk (12/20/2023)  Social Connections: Moderately Isolated (02/23/2024)  Tobacco Use: Medium Risk (02/23/2024)     Readmission Risk Interventions    02/24/2024   11:03 AM 12/06/2023   12:15 PM 08/03/2021   11:19 AM  Readmission Risk Prevention Plan  Transportation Screening Complete Complete Complete  PCP or Specialist Appt within 3-5 Days  Complete   HRI or Home Care Consult Complete Complete   Social Work Consult for Recovery Care Planning/Counseling Complete Complete   Palliative Care Screening Not Applicable Not Applicable    Medication Review Oceanographer) Complete Complete Complete  PCP or Specialist appointment within 3-5 days of discharge   Complete  HRI or Home Care Consult   Complete  SW Recovery Care/Counseling Consult   Complete  Palliative Care Screening   Not Applicable  Skilled Nursing Facility   Not Applicable

## 2024-02-25 NOTE — Discharge Summary (Addendum)
 " Physician Discharge Summary   Patient: Derrick Long MRN: [id number] DOB: [birth year]  Admit date:     02/23/2024  Discharge date: 02/25/2024  Discharge Physician: Alm Arisa Congleton   PCP: Estefana Doffing, MD   Recommendations at discharge:   Please follow up with primary care provider within 1-2 weeks  Please repeat BMP and CBC in one week    Hospital Course: 89 year old male with a history of metastatic prostate cancer to the adrenal gland, renal masses suspicious for multifocal RCC, symptomatic bradycardia status post PPM, hypertension, hyperlipidemia, CKD stage IIIa, chronic atrial fibrillation not on AC secondary to prior GI bleed and advanced age, major neurocognitive disorder, hyperlipidemia, B12 and vitamin D  deficiency, chronic back pain on tramadol  presenting with worsening lower extremity edema.  He was having some difficulty ambulating due to edema.  He visited the ED at the [facility] in [place] on 02/14/2024 with cough, generalized weakness and lower extremity edema.  He was diagnosed with pneumonia and prescribed Augmentin and doxycycline . Furosemide   increasded to 40 mg daily for 5 days then down to 20 mg daily. Report cough at that time.  Energy is better.   However since then, apparently his lower extremity edema had worsened again.  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 Recurrent melena 11/2023, no endoscopy, denies further episodes of  melena or abdominal pain since restarting PPI   Patient denies fevers, chills, headache, chest pain, dyspnea, nausea, vomiting, diarrhea, abdominal pain, dysuria, hematuria, hematochezia, and melena. He denies any worsening orthopnea or abdominal bloating.  He denies any chest pain or shortness of breath.  In the ED, the patient was afebrile and hemodynamically stable with oxygen saturation 97% room air. WBC 3.8, hemoglobin 9.5, platelet 251.  Sodium 134, potassium 3.3, bicarbonate 22, serum creatinine  0.99.  AST 17, ALT < 5, alk phosphatase 55, total bilirubin 1.1.  proBNP was 12,607.  Troponin 126>> 105. The patient was started on IV furosemide  with good clinical improvement.  He was transition to oral furosemide .  His renal function remained stable on furosemide  40 mg daily with which he was discharged home.  [facility] in 1 week  Assessment and Plan: Acute on chronic HFpEF - The patient does have some symptoms of fluid overload - proBNP 12,607 - Start IV furosemide  40 mg IV twice daily - Accurate I's and O's--incomplete - 10/13/2021 echo EF 65 to 70% grade 1 DD, normal RVF, trivial MR, mild AS; ascending aorta  3.7cm -12/25/2023 Echo 01/04/2024 EF 50%, G2DD, normal RVF; ascending aorta 4.5 cm - Repeat limited echo 02/24/24 EF 55-60%, no wMA, normal RVF, small pericardial eff - His weight at his recent geriatrician follow-up was 151.6 pounds - Urine protein creatinine ratio--0.4 - ultrasound legs--no DVT - d/c weight 140.6 lbs - d/c home with lasix  40 mg po daily   Fever -02/23/24--up to 101.2 -blood cultures x 2--neg to date -UA--no pyuria -CXR--personally reviewed--no infiltrates, no edema -check COVID/Flu/RSV--neg -viral respiratory panel--neg -uric acid 7.1 - no fever > 24 hours at time d/c   CKD stage IIIa - Baseline creatinine 0.9-1.2 - serum creatinine 0.92 at time d/c   Acute gout -tender/warm on right first MTPJ -significant improvement with one dose prednisone  -give 2 additional days prednisone  - start allopurinol  1 week after d/c   Chronic atrial fibrillation - Not on AC secondary to GI bleed and advanced age - Rate controlled - Continue  carvedilol    Elevated troponin - Not consistent with ACS - Troponin 126>> 105 - No chest pain presently   Metastatic prostate cancer -Metastasis 06/2020: R adrenal biopsy - metastatic prostatic adenocarcinoma -CT AP 12/05/2023--shows increasing size of  adrenal metastasis from 1.9>>4.9cm - Treatment:  Degarelix  06/2020 x1 Eligard  (08/21/2020)Q6 months - current last dose 12/2023 Abiraterone  100 mg + prednisone  5 mg daily 01/2021 - 05/2021 Darolutamide  300 mg + prednisone  5mg  daily 06/2021 to 12/2022 [employer] and genetic testing- no targetable mutation Continued leuprolide  (Eligard ) injection Q6 months  - Last injection of leuprolide  12/28/2023 - Was seen by medical oncology last hospital admission 12/07/2023--not a candidate for further chemotherapy secondary to advanced age and comorbidities; can continue ADT   Chronic back pain - On chronic tramadol  at home   Essential hypertension - Holding lisinopril  temporarily>>decrease dose at time d/c - Continue carvedilol    BPH with LUTS -Status post UroLift 05/19/2021 -Continue tamsulosin  0.4 mg -[facility]  Hypokalemia - Repleted - Magnesium  2.2  Severe Malnutrition -continue nutritional supplements     Consultants: none Procedures performed: none  Disposition: Home Diet recommendation:  Cardiac diet DISCHARGE MEDICATION: Allergies as of 02/25/2024   No Known Allergies      Medication List     TAKE these medications    acetaminophen  650 MG CR tablet Commonly known as: TYLENOL  Take 1,300 mg by mouth every 8 (eight) hours as needed for pain.   allopurinol  100 MG tablet Commonly known as: ZYLOPRIM  Take 1 tablet (100 mg total) by mouth daily. Start taking on: March 05, 2024   carvedilol  3.125 MG tablet Commonly known as: COREG  Take 3.125 mg by mouth 2 (two) times daily with a meal.   diclofenac  sodium 1 % Gel Commonly known as: VOLTAREN  Apply 2 g topically 4 (four) times daily. What changed:  when to take this reasons to take this   Ensure Take 237 mLs by mouth daily as needed (nutrition). Vanilla or chocolate   ferrous sulfate  324 MG Tbec Take 324 mg by mouth. Takes one tablet every other day   furosemide  40 MG tablet Commonly known  as: LASIX  Take 1 tablet (40 mg total) by mouth daily. Start taking on: February 26, 2024 What changed:  medication strength how much to take additional instructions   lisinopril  5 MG tablet Commonly known as: ZESTRIL  Take 1 tablet (5 mg total) by mouth daily. for high blood pressure What changed:  medication strength how much to take   pantoprazole  40 MG tablet Commonly known as: PROTONIX  TAKE 1 TABLET BY MOUTH ONCE DAILY.   predniSONE  50 MG tablet Commonly known as: DELTASONE  Take 1 tablet (50 mg total) by mouth daily with breakfast. Start taking on: February 26, 2024   senna 8.6 MG Tabs tablet Commonly known as: SENOKOT Take 1 tablet (8.6 mg total) by mouth at bedtime.   tamsulosin  0.4 MG Caps capsule Commonly known as: FLOMAX  Take 1 capsule (0.4 mg total) by mouth 2 (two) times daily.   traMADol  50 MG tablet Commonly known as: ULTRAM  Take 1 tablet (50 mg total) by mouth every 6 (six) hours as needed.        Discharge Exam: Filed Weights   02/23/24 1529 02/24/24 0420 02/25/24 0500  Weight: 66.2 kg 63.8 kg 66.5 kg   HEENT:  [place]/AT, No thrush, no icterus CV:  RRR, no rub, no S3, no S4 Lung:  CTA, no wheeze, no rhonchi Abd:  soft/+BS, NT Ext:  trace LE edema, no  lymphangitis, no synovitis, no rash;  mild tenderness and warmth right first MTPJ   Condition at discharge: stable  The results of significant diagnostics from this hospitalization (including imaging, microbiology, ancillary and laboratory) are listed below for reference.   Imaging Studies: ECHOCARDIOGRAM LIMITED Result Date: 02/24/2024    ECHOCARDIOGRAM LIMITED REPORT   Patient Name:   DAQUANE AGUILAR Date of Exam: 02/24/2024 Medical Rec #:  [id number]         Height:       73.0 in Accession #:    [id number]        Weight:       140.7 lb Date of Birth:  1926/10/21          BSA:          1.853 m Patient Age:    89 years          BP:           153/54 mmHg Patient Gender: M                 HR:           81  bpm. Exam Location:  [facility] Procedure: 3D Echo, Limited Echo and Strain Analysis (Both Spectral and Color            Flow Doppler were utilized during procedure). Indications:    CHF-Acute Diastolic I50.31  History:        Patient has prior history of Echocardiogram examinations, most                 recent 10/13/2021. Arrythmias:Atrial Fibrillation; Risk                 Factors:Former Smoker, Dyslipidemia and Hypertension.  Sonographer:    Aida Pizza RCS Referring Phys: [id number] [place] Depaz  Sonographer Comments: Global longitudinal strain was attempted. IMPRESSIONS  1. Limited study.  2. Left ventricular ejection fraction, by estimation, is 55 to 60%. Left ventricular ejection fraction by 3D volume is 59 %. The left ventricle has normal function. The left ventricle has no regional wall motion abnormalities. There is severe asymmetric  left ventricular hypertrophy of the septal segment.  3. Right ventricular systolic function is normal. The right ventricular size is normal. Tricuspid regurgitation signal is inadequate for assessing PA pressure.  4. A small pericardial effusion is present. The pericardial effusion is anterior to the right ventricle. There is no evidence of cardiac tamponade.  5. The mitral valve is degenerative.  6. The aortic valve is tricuspid. There is moderate calcification of the aortic valve.  7. The inferior vena cava is normal in size with greater than 50% respiratory variability, suggesting right atrial pressure of 3 mmHg. FINDINGS  Left Ventricle: Left ventricular ejection fraction, by estimation, is 55 to 60%. Left ventricular ejection fraction by 3D volume is 59 %. The left ventricle has normal function. The left ventricle has no regional wall motion abnormalities. The left ventricular internal cavity size was normal in size. There is severe asymmetric left ventricular hypertrophy of the septal segment. Right Ventricle: The right ventricular size is normal. No increase in right  ventricular wall thickness. Right ventricular systolic function is normal. Tricuspid regurgitation signal is inadequate for assessing PA pressure. Pericardium: A small pericardial effusion is present. The pericardial effusion is anterior to the right ventricle. There is no evidence of cardiac tamponade. Mitral Valve: The mitral valve is degenerative in appearance. Mild mitral annular calcification. Aortic Valve: The aortic valve is tricuspid.  There is moderate calcification of the aortic valve. There is mild aortic valve annular calcification. Aorta: The aortic root is normal in size and structure. Venous: The inferior vena cava is normal in size with greater than 50% respiratory variability, suggesting right atrial pressure of 3 mmHg. Additional Comments: 3D was performed not requiring image post processing on an independent workstation and was normal. A device lead is visualized.  LEFT VENTRICLE PLAX 2D LVIDd:         4.30 cm LVIDs:         2.80 cm LV PW:         1.40 cm         3D Volume EF LV IVS:        2.00 cm         LV 3D EF:    Left LVOT diam:     2.10 cm                      ventricul LVOT Area:     3.46 cm                     ar                                             ejection                                             fraction LV Volumes (MOD)                            by 3D LV vol d, MOD    77.9 ml                    volume is A2C:                                        59 %. LV vol d, MOD    111.0 ml A4C: LV vol s, MOD    37.3 ml       3D Volume EF: A2C:                           3D EF:        59 % LV vol s, MOD    50.4 ml       LV EDV:       152 ml A4C:                           LV ESV:       62 ml LV SV MOD A2C:   40.6 ml       LV SV:        90 ml LV SV MOD A4C:   111.0 ml LV SV MOD BP:    58.7 ml RIGHT VENTRICLE TAPSE (M-mode): 2.5 cm LEFT ATRIUM         Index LA diam:    4.40 cm 2.37 cm/m   AORTA Ao Root diam:  3.70 cm  SHUNTS Systemic Diam: 2.10 cm Jayson Sierras MD Electronically signed  by Jayson Sierras MD Signature Date/Time: 02/24/2024/10:47:04 AM    Final    US  Venous Img Lower Bilateral (DVT) Result Date: 02/23/2024 CLINICAL DATA:  Bilateral lower extremity edema. EXAM: BILATERAL LOWER EXTREMITY VENOUS DOPPLER ULTRASOUND TECHNIQUE: Gray-scale sonography with graded compression, as well as color Doppler and duplex ultrasound were performed to evaluate the lower extremity deep venous systems from the level of the common femoral vein and including the common femoral, femoral, profunda femoral, popliteal and calf veins including the posterior tibial, peroneal and gastrocnemius veins when visible. The superficial great saphenous vein was also interrogated. Spectral Doppler was utilized to evaluate flow at rest and with distal augmentation maneuvers in the common femoral, femoral and popliteal veins. COMPARISON:  None Available. FINDINGS: RIGHT LOWER EXTREMITY Common Femoral Vein: No evidence of thrombus. Normal compressibility, respiratory phasicity and response to augmentation. Saphenofemoral Junction: No evidence of thrombus. Normal compressibility and flow on color Doppler imaging. Profunda Femoral Vein: No evidence of thrombus. Normal compressibility and flow on color Doppler imaging. Femoral Vein: No evidence of thrombus. Normal compressibility, respiratory phasicity and response to augmentation. Popliteal Vein: No evidence of thrombus. Normal compressibility, respiratory phasicity and response to augmentation. Calf Veins: No evidence of thrombus. Normal compressibility and flow on color Doppler imaging. Superficial Great Saphenous Vein: No evidence of thrombus. Normal compressibility. Venous Reflux:  None. Other Findings: No evidence of superficial thrombophlebitis or abnormal fluid collection. LEFT LOWER EXTREMITY Common Femoral Vein: No evidence of thrombus. Normal compressibility, respiratory phasicity and response to augmentation. Saphenofemoral Junction: No evidence of thrombus. Normal  compressibility and flow on color Doppler imaging. Profunda Femoral Vein: No evidence of thrombus. Normal compressibility and flow on color Doppler imaging. Femoral Vein: No evidence of thrombus. Normal compressibility, respiratory phasicity and response to augmentation. Popliteal Vein: No evidence of thrombus. Normal compressibility, respiratory phasicity and response to augmentation. Calf Veins: No evidence of thrombus. Normal compressibility and flow on color Doppler imaging. Superficial Great Saphenous Vein: No evidence of thrombus. Normal compressibility. Venous Reflux:  None. Other Findings: No evidence of superficial thrombophlebitis. Small Baker's cyst of the left popliteal fossa measures approximately 2.8 x 2.3 x 1.3 cm. IMPRESSION: 1. No evidence of deep venous thrombosis in either lower extremity. 2. Small Baker's cyst of the left popliteal fossa. Electronically Signed   By: Marcey Moan M.D.   On: 02/23/2024 17:46   DG Chest 1 View Result Date: 02/23/2024 CLINICAL DATA:  CHF. EXAM: CHEST  1 VIEW COMPARISON:  10/29/2021 FINDINGS: Stable cardiomegaly. Stable appearance of the left chest dual chamber cardiac pacemaker. Chronic scarring at the lung apices. Lungs are clear without pulmonary edema. IMPRESSION: 1. No acute cardiopulmonary disease. 2. Stable cardiomegaly. Electronically Signed   By: Juliene Balder M.D.   On: 02/23/2024 12:55    Microbiology: Results for orders placed or performed during the hospital encounter of 02/23/24  Resp panel by RT-PCR (RSV, Flu A&B, Covid) Anterior Nasal Swab     Status: None   Collection Time: 02/24/24  6:15 AM   Specimen: Anterior Nasal Swab  Result Value Ref Range Status   SARS Coronavirus 2 by RT PCR NEGATIVE NEGATIVE Final    Comment: (NOTE) SARS-CoV-2 target nucleic acids are NOT DETECTED.  The SARS-CoV-2 RNA is generally detectable in upper respiratory specimens during the acute phase of infection. The lowest concentration of SARS-CoV-2 viral  copies this assay can detect is 138 copies/mL. A negative result does not  preclude SARS-Cov-2 infection and should not be used as the sole basis for treatment or other patient management decisions. A negative result may occur with  improper specimen collection/handling, submission of specimen other than nasopharyngeal swab, presence of viral mutation(s) within the areas targeted by this assay, and inadequate number of viral copies(<138 copies/mL). A negative result must be combined with clinical observations, patient history, and epidemiological information. The expected result is Negative.  Fact Sheet for Patients:  bloggercourse.com  Fact Sheet for Healthcare Providers:  seriousbroker.it  This test is no t yet approved or cleared by the [place]  FDA and  has been authorized for detection and/or diagnosis of SARS-CoV-2 by FDA under an Emergency Use Authorization (EUA). This EUA will remain  in effect (meaning this test can be used) for the duration of the COVID-19 declaration under Section 564(b)(1) of the Act, 21 U.S.C.section 360bbb-3(b)(1), unless the authorization is terminated  or revoked sooner.       Influenza A by PCR NEGATIVE NEGATIVE Final   Influenza B by PCR NEGATIVE NEGATIVE Final    Comment: (NOTE) The Xpert Xpress SARS-CoV-2/FLU/RSV plus assay is intended as an aid in the diagnosis of influenza from Nasopharyngeal swab specimens and should not be used as a sole basis for treatment. Nasal washings and aspirates are unacceptable for Xpert Xpress SARS-CoV-2/FLU/RSV testing.  Fact Sheet for Patients: bloggercourse.com  Fact Sheet for Healthcare Providers: seriousbroker.it  This test is not yet approved or cleared by the [place]  FDA and has been authorized for detection and/or diagnosis of SARS-CoV-2 by FDA under an Emergency Use Authorization (EUA). This  EUA will remain in effect (meaning this test can be used) for the duration of the COVID-19 declaration under Section 564(b)(1) of the Act, 21 U.S.C. section 360bbb-3(b)(1), unless the authorization is terminated or revoked.     Resp Syncytial Virus by PCR NEGATIVE NEGATIVE Final    Comment: (NOTE) Fact Sheet for Patients: bloggercourse.com  Fact Sheet for Healthcare Providers: seriousbroker.it  This test is not yet approved or cleared by the [place]  FDA and has been authorized for detection and/or diagnosis of SARS-CoV-2 by FDA under an Emergency Use Authorization (EUA). This EUA will remain in effect (meaning this test can be used) for the duration of the COVID-19 declaration under Section 564(b)(1) of the Act, 21 U.S.C. section 360bbb-3(b)(1), unless the authorization is terminated or revoked.  Performed at [facility], [address], [place], [place] [address]   Culture, blood (Routine X 2) w Reflex to ID Panel     Status: None (Preliminary result)   Collection Time: 02/24/24  6:35 AM   Specimen: BLOOD  Result Value Ref Range Status   Specimen Description BLOOD BLOOD RIGHT ARM  Final   Special Requests   Final    BOTTLES DRAWN AEROBIC AND ANAEROBIC Blood Culture adequate volume   Culture   Final    NO GROWTH 1 DAY Performed at [facility], [address], [place], [place] [address]    Report Status PENDING  Incomplete  Culture, blood (Routine X 2) w Reflex to ID Panel     Status: None (Preliminary result)   Collection Time: 02/24/24  6:35 AM   Specimen: BLOOD  Result Value Ref Range Status   Specimen Description BLOOD BLOOD RIGHT HAND  Final   Special Requests   Final    BOTTLES DRAWN AEROBIC AND ANAEROBIC Blood Culture adequate volume   Culture   Final    NO GROWTH 1 DAY Performed at  [facility], [address] [place], [place] [address]    Report Status PENDING  Incomplete  Respiratory (~20 pathogens) panel  by PCR     Status: None   Collection Time: 02/24/24  7:40 AM   Specimen: Nasopharyngeal Swab; Respiratory  Result Value Ref Range Status   Adenovirus NOT DETECTED NOT DETECTED Final   Coronavirus 229E NOT DETECTED NOT DETECTED Final    Comment: (NOTE) The Coronavirus on the Respiratory Panel, DOES NOT test for the novel  Coronavirus (2019 nCoV)    Coronavirus HKU1 NOT DETECTED NOT DETECTED Final   Coronavirus NL63 NOT DETECTED NOT DETECTED Final   Coronavirus OC43 NOT DETECTED NOT DETECTED Final   Metapneumovirus NOT DETECTED NOT DETECTED Final   Rhinovirus / Enterovirus NOT DETECTED NOT DETECTED Final   Influenza A NOT DETECTED NOT DETECTED Final   Influenza B NOT DETECTED NOT DETECTED Final   Parainfluenza Virus 1 NOT DETECTED NOT DETECTED Final   Parainfluenza Virus 2 NOT DETECTED NOT DETECTED Final   Parainfluenza Virus 3 NOT DETECTED NOT DETECTED Final   Parainfluenza Virus 4 NOT DETECTED NOT DETECTED Final   Respiratory Syncytial Virus NOT DETECTED NOT DETECTED Final   Bordetella pertussis NOT DETECTED NOT DETECTED Final   Bordetella Parapertussis NOT DETECTED NOT DETECTED Final   Chlamydophila pneumoniae NOT DETECTED NOT DETECTED Final   Mycoplasma pneumoniae NOT DETECTED NOT DETECTED Final    Comment: Performed at [facility], [address], [place], [place] [address]    Labs: CBC: Recent Labs  Lab 02/23/24 1027 02/24/24 0635  WBC 3.8* 3.6*  NEUTROABS 1.7  --   HGB 9.7* 8.6*  HCT 29.7* 25.7*  MCV 102.8* 101.2*  PLT 251 229   Basic Metabolic Panel: Recent Labs  Lab 02/23/24 1027 02/24/24 0417 02/25/24 0413  NA 134* 134* 133*  K 3.3* 3.4* 3.8  CL 96* 97* 97*  CO2 22 28 27   GLUCOSE 66* 63* 137*  BUN 11 12 15   CREATININE 0.99 1.01 0.92  CALCIUM  9.0 8.5* 8.6*  MG  --  1.8 2.2   Liver Function Tests: Recent Labs  Lab 02/23/24 1027  AST 17  ALT <5  ALKPHOS 55  BILITOT 1.1  PROT 7.0  ALBUMIN 3.5   CBG: No results for input(s): GLUCAP in  the last 168 hours.  Discharge time spent: greater than 30 minutes.  Signed: Alm Schneider, MD [facility] 02/25/2024 "

## 2024-02-25 NOTE — Plan of Care (Signed)

## 2024-02-29 LAB — CULTURE, BLOOD (ROUTINE X 2)
Culture: NO GROWTH
Culture: NO GROWTH
Special Requests: ADEQUATE
Special Requests: ADEQUATE

## 2024-03-21 ENCOUNTER — Encounter: Payer: Self-pay | Admitting: Oncology

## 2024-06-29 ENCOUNTER — Inpatient Hospital Stay

## 2024-06-29 ENCOUNTER — Inpatient Hospital Stay: Admitting: Oncology

## 2024-09-14 ENCOUNTER — Other Ambulatory Visit

## 2024-09-20 ENCOUNTER — Ambulatory Visit: Admitting: Urology
